# Patient Record
Sex: Male | Born: 1950
Health system: Southern US, Community
[De-identification: ages and names within clinical notes are randomized; demographics above are authoritative.]

## PROBLEM LIST (undated history)

## (undated) DIAGNOSIS — K219 Gastro-esophageal reflux disease without esophagitis: Secondary | ICD-10-CM

## (undated) DIAGNOSIS — L309 Dermatitis, unspecified: Secondary | ICD-10-CM

## (undated) DIAGNOSIS — J302 Other seasonal allergic rhinitis: Secondary | ICD-10-CM

## (undated) DIAGNOSIS — M1990 Unspecified osteoarthritis, unspecified site: Secondary | ICD-10-CM

## (undated) DIAGNOSIS — J45909 Unspecified asthma, uncomplicated: Secondary | ICD-10-CM

## (undated) DIAGNOSIS — L570 Actinic keratosis: Secondary | ICD-10-CM

## (undated) DIAGNOSIS — T7840XA Allergy, unspecified, initial encounter: Secondary | ICD-10-CM

## (undated) DIAGNOSIS — E039 Hypothyroidism, unspecified: Secondary | ICD-10-CM

## (undated) DIAGNOSIS — E079 Disorder of thyroid, unspecified: Secondary | ICD-10-CM

## (undated) HISTORY — DX: Gastro-esophageal reflux disease without esophagitis: K21.9

## (undated) HISTORY — DX: Actinic keratosis: L57.0

## (undated) HISTORY — PX: KNEE SURGERY: SHX244

## (undated) HISTORY — DX: Unspecified osteoarthritis, unspecified site: M19.90

## (undated) HISTORY — DX: Allergy, unspecified, initial encounter: T78.40XA

## (undated) HISTORY — PX: NASAL SEPTUM SURGERY: SHX37

## (undated) HISTORY — PX: CATARACT EXTRACTION: SUR2

## (undated) HISTORY — PX: EYE SURGERY: SHX253

## (undated) HISTORY — DX: Dermatitis, unspecified: L30.9

## (undated) HISTORY — PX: COLON SURGERY: SHX602

---

## 2005-08-04 ENCOUNTER — Ambulatory Visit: Payer: Self-pay | Admitting: Unknown Physician Specialty

## 2006-06-06 DIAGNOSIS — Z86018 Personal history of other benign neoplasm: Secondary | ICD-10-CM

## 2006-06-06 HISTORY — DX: Personal history of other benign neoplasm: Z86.018

## 2008-01-29 ENCOUNTER — Emergency Department: Payer: Self-pay | Admitting: Emergency Medicine

## 2009-03-05 ENCOUNTER — Ambulatory Visit: Payer: Self-pay | Admitting: Family Medicine

## 2009-04-09 ENCOUNTER — Ambulatory Visit: Payer: Self-pay | Admitting: Unknown Physician Specialty

## 2009-10-31 ENCOUNTER — Emergency Department: Payer: Self-pay | Admitting: Internal Medicine

## 2010-08-22 ENCOUNTER — Ambulatory Visit: Payer: Self-pay | Admitting: Family Medicine

## 2010-09-05 ENCOUNTER — Ambulatory Visit: Payer: Self-pay | Admitting: Family Medicine

## 2011-11-06 ENCOUNTER — Ambulatory Visit: Payer: Self-pay | Admitting: Unknown Physician Specialty

## 2011-11-06 LAB — HM COLONOSCOPY

## 2011-11-07 ENCOUNTER — Ambulatory Visit: Payer: Self-pay | Admitting: Family Medicine

## 2011-11-07 LAB — PATHOLOGY REPORT

## 2011-11-29 IMAGING — CT CT ABDOMEN W/ CM
1 of 2 series · 15 of 32 positions shown, 19 images · non-contrast
Comparison: none

REASON FOR EXAM: RUQ pain
COMMENTS:

[Series 2: abd with 5.0 i40f 3 · axial · 0.98mm/px · z∈[-194,+52]mm · 15 of 55 slices shown, 19 images]
[im 3/55  soft-tissue]
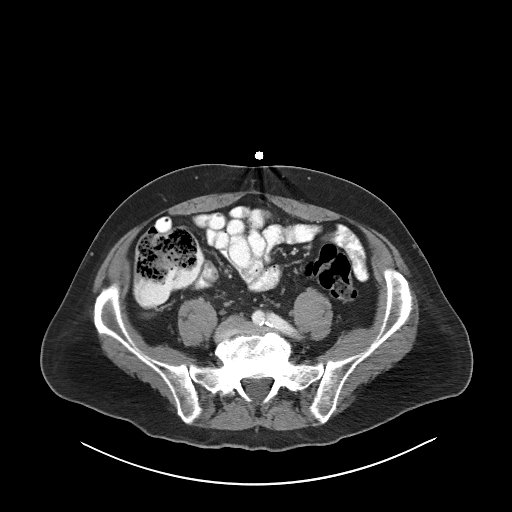
[im 3/55  bone]
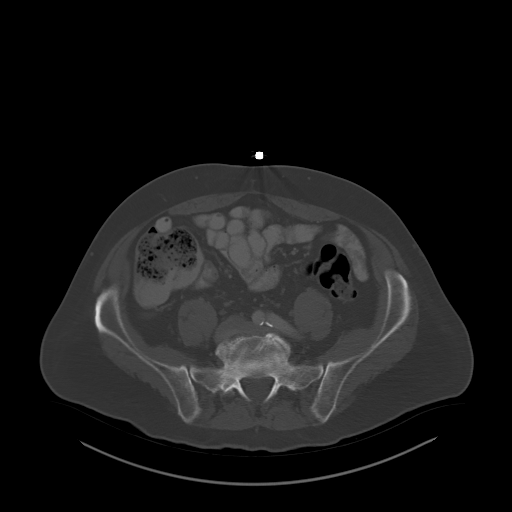
[im 8/55  soft-tissue]
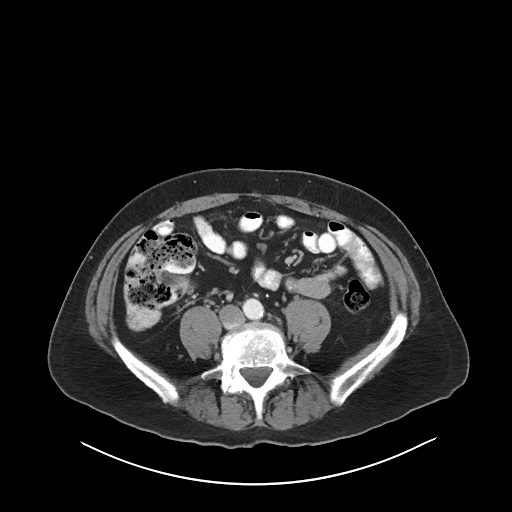
[im 13/55  soft-tissue]
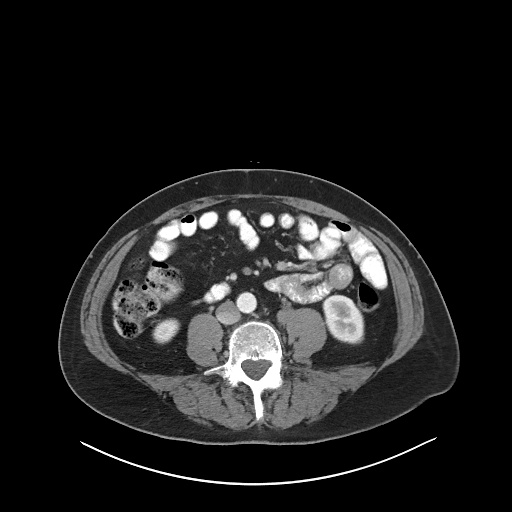
[im 15/55  soft-tissue]
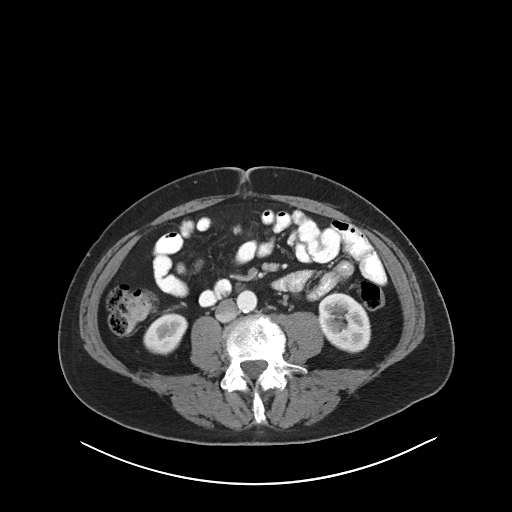
[im 20/55  soft-tissue]
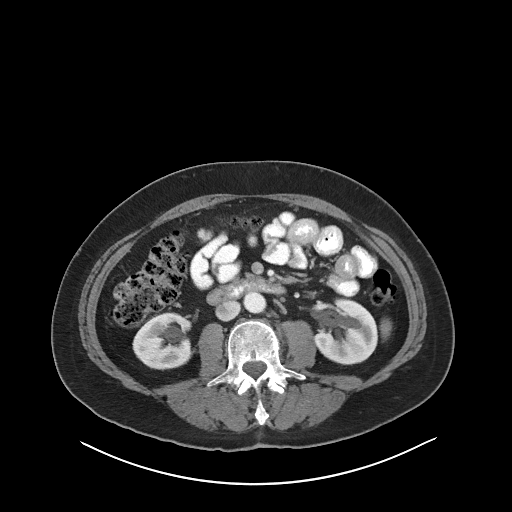
[im 23/55  soft-tissue]
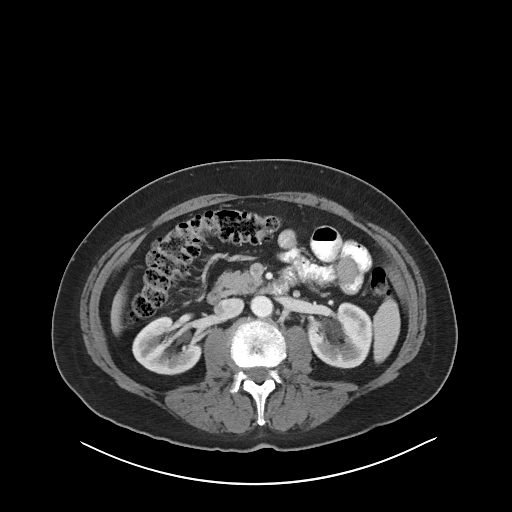
[im 28/55  soft-tissue]
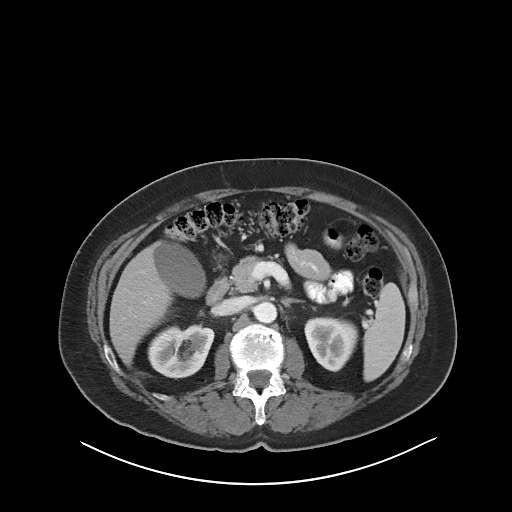
[im 32/55  soft-tissue]
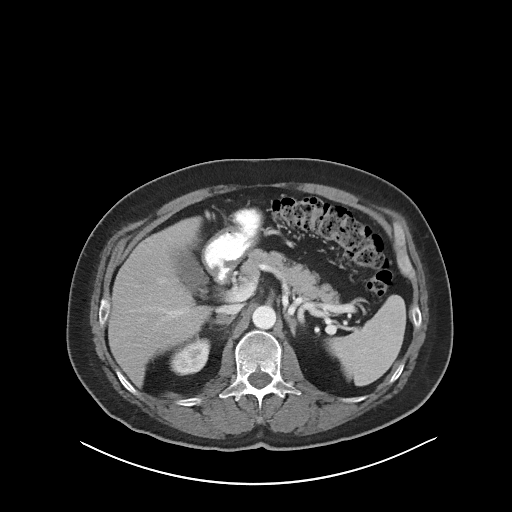
[im 35/55  soft-tissue]
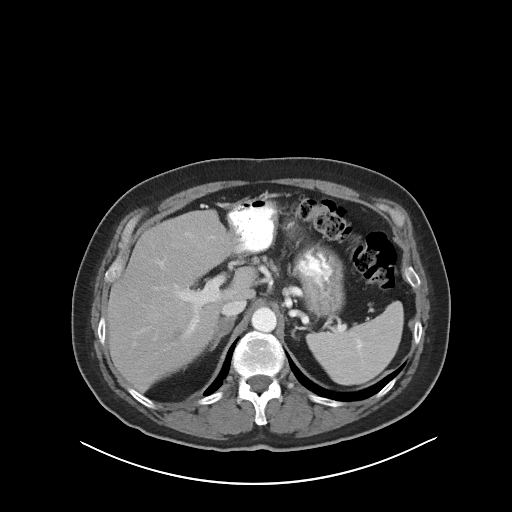
[im 35/55  bone]
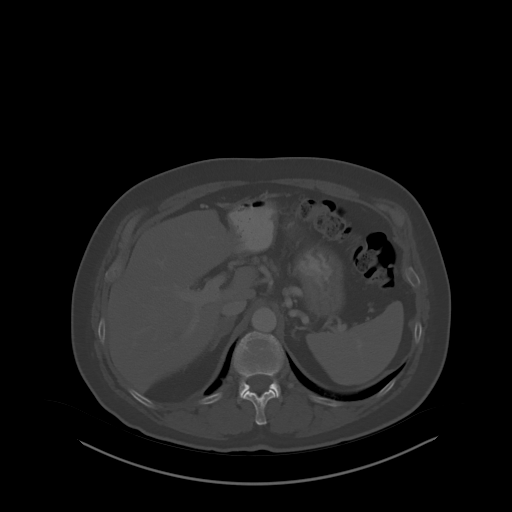
[im 40/55  soft-tissue]
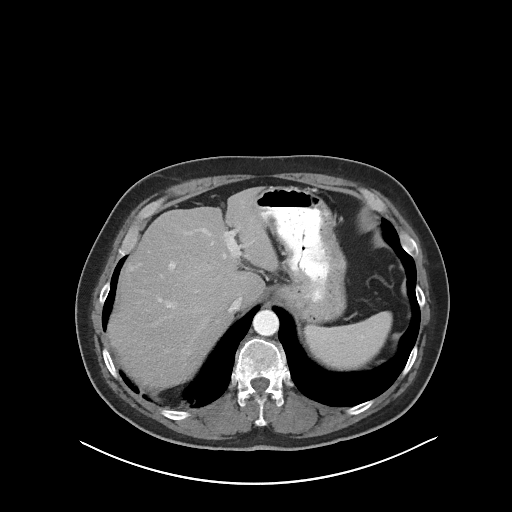
[im 42/55  soft-tissue]
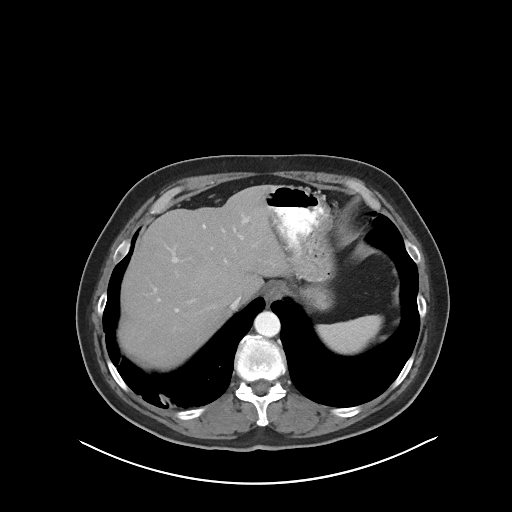
[im 45/55  lung]
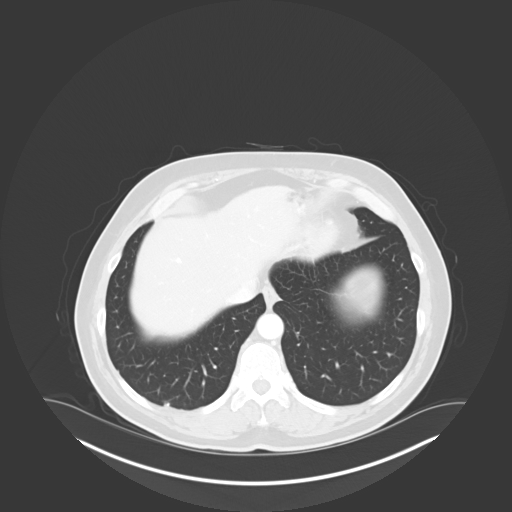
[im 47/55  soft-tissue]
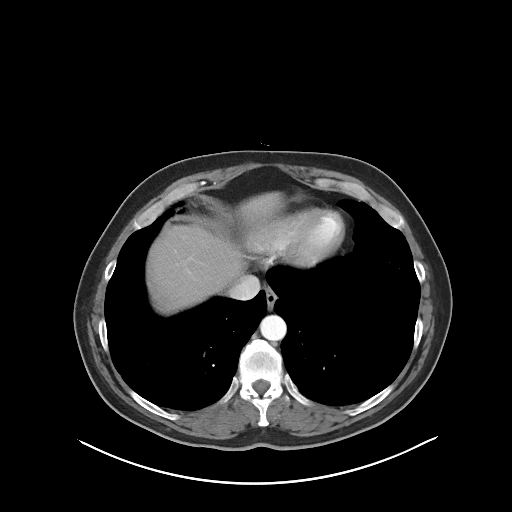
[im 47/55  lung]
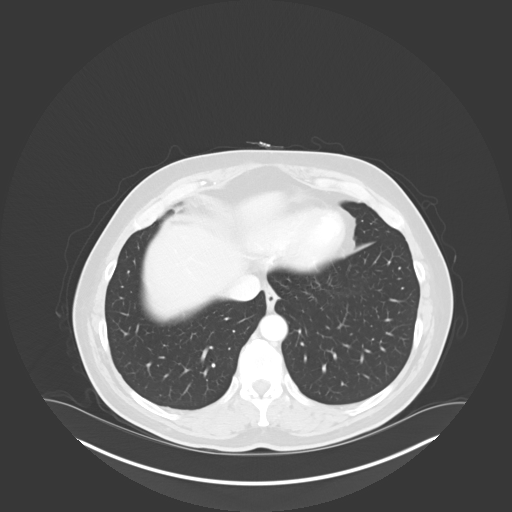
[im 50/55  lung]
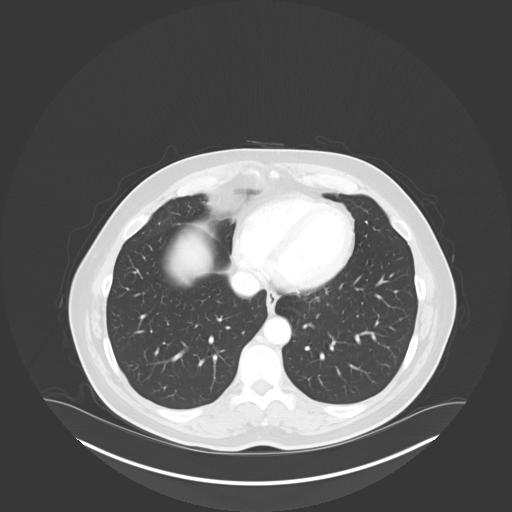
[im 52/55  soft-tissue]
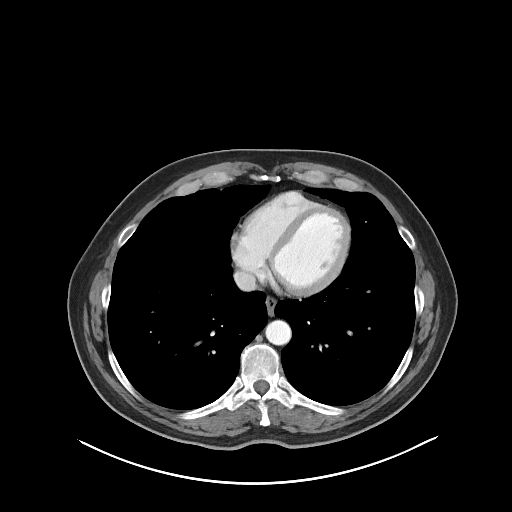
[im 52/55  lung]
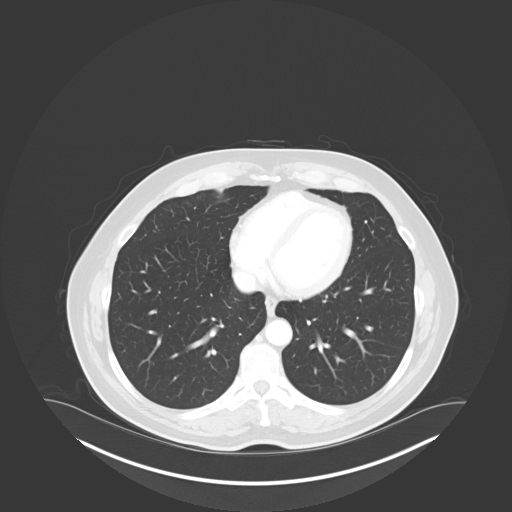

[15 of 32 positions shown; findings below may reference images not displayed]

PROCEDURE:     KCT - KCT ABDOMEN STANDARD W  - September 05, 2010  [DATE]

RESULT:     Axial CT scanning was performed through the abdomen at 5 mm
intervals and slice thicknesses following intravenous administration of 100
cc of Psovue-XKW. The patient also received oral contrast material. Review
of multiplanar reconstructed images was performed separately on the VIA
monitor.

The liver exhibits normal density with no focal mass nor ductal dilation.
The gallbladder is adequately distended with no evidence of calcified stones
or wall thickening or pericholecystic fluid. The pancreas, spleen, partially
distended stomach, left adrenal gland, and kidneys exhibit no acute
abnormality. There are parapelvic cysts within both kidneys. There is mild
enlargement of the right adrenal gland which measures 1.7 cm transversely by
approximately 2.3 cm AP. The caliber of the abdominal aorta is normal. The
partially contrast-filled loops of small and large bowel are normal where
visualized.

The lung bases exhibit atelectasis versus scarring posteriorly in the
costophrenic gutter on the right. A classic alveolar pneumonia is not
demonstrated. I see no pleural effusion. The lower thoracic and upper and
mid lumbar vertebral bodies are preserved in height.
IMPRESSION: 1. I do not see evidence of acute hepatobiliary abnormality.
2. There is atelectasis versus scarring in the posterior costophrenic gutter
on the right.
3. I see no acute abnormality of the stomach or pancreas or spleen.
4. There is mild enlargement of the right adrenal gland.
5. The left adrenal gland, kidneys, and the observed portions of the bowel
exhibit no acute abnormality.

## 2012-07-16 ENCOUNTER — Ambulatory Visit: Payer: Self-pay | Admitting: Ophthalmology

## 2012-07-29 ENCOUNTER — Ambulatory Visit: Payer: Self-pay | Admitting: Ophthalmology

## 2013-01-20 ENCOUNTER — Ambulatory Visit: Payer: Self-pay | Admitting: Family Medicine

## 2013-05-19 ENCOUNTER — Ambulatory Visit: Payer: Self-pay | Admitting: Family Medicine

## 2013-09-01 ENCOUNTER — Ambulatory Visit: Payer: Self-pay | Admitting: Family Medicine

## 2013-09-11 LAB — LIPID PANEL
Cholesterol: 203 mg/dL — AB (ref 0–200)
HDL: 62 mg/dL (ref 35–70)
LDL Cholesterol: 127 mg/dL
LDl/HDL Ratio: 2
Triglycerides: 71 mg/dL (ref 40–160)

## 2013-09-11 LAB — TSH: TSH: 2.32 u[IU]/mL (ref 0.41–5.90)

## 2014-03-10 ENCOUNTER — Emergency Department: Payer: Self-pay | Admitting: Emergency Medicine

## 2014-03-19 ENCOUNTER — Ambulatory Visit: Payer: Self-pay | Admitting: Family Medicine

## 2014-07-28 LAB — BASIC METABOLIC PANEL
Creatinine: 0.9 mg/dL (ref 0.6–1.3)
GLUCOSE: 108 mg/dL
Potassium: 4.3 mmol/L (ref 3.4–5.3)
SODIUM: 140 mmol/L (ref 137–147)

## 2014-07-28 LAB — CBC AND DIFFERENTIAL
HEMATOCRIT: 49 % (ref 41–53)
HEMOGLOBIN: 17.1 g/dL (ref 13.5–17.5)
Neutrophils Absolute: 4 /uL
PLATELETS: 219 10*3/uL (ref 150–399)
WBC: 6.6 10^3/mL

## 2014-09-18 NOTE — Op Note (Signed)
PATIENT NAME:  Daniel [name], BUDZYNSKI MR#:  [id number] DATE OF BIRTH:  May 16, 1951  DATE OF PROCEDURE:  07/29/2012  PREOPERATIVE DIAGNOSIS:  Cataract, left eye.    POSTOPERATIVE DIAGNOSIS:  Cataract, left eye.  PROCEDURE PERFORMED:  Extracapsular cataract extraction using phacoemulsification with placement of an Alcon [id number], 19.0-diopter posterior chamber lens, serial #[id number].  SURGEON:  Loura Back. Dingeldein, MD  ASSISTANT:  None.  ANESTHESIA:  4% lidocaine and 0.75% Marcaine in a 50/50 mixture with 10 units/mL of Hylenex added, given as a peribulbar.   ANESTHESIOLOGIST:  Julianne Handler, MD  COMPLICATIONS:  None.  ESTIMATED BLOOD LOSS:  Less than 1 ml.  DESCRIPTION OF PROCEDURE:  The patient was brought to the operating room and given a peribulbar block.  The patient was then prepped and draped in the usual fashion.  The vertical rectus muscles were imbricated using 5-0 silk sutures.  These sutures were then clamped to the sterile drapes as bridle sutures.  A limbal peritomy was performed extending two clock hours and hemostasis was obtained with cautery.  A partial thickness scleral groove was made at the surgical limbus and dissected anteriorly in a lamellar dissection using an Alcon crescent knife.  The anterior chamber was entered supero-temporally with a Superblade and through the lamellar dissection with a 2.6 mm keratome.  DisCoVisc was used to replace the aqueous and a continuous tear capsulorrhexis was carried out.  Hydrodissection and hydrodelineation were carried out with balanced salt and a 27 gauge canula.  The nucleus was rotated to confirm the effectiveness of the hydrodissection.  Phacoemulsification was carried out using a divide-and-conquer technique.  Total ultrasound time was 1 minute and 25.6 seconds with an average power of 26.60 percent and CDE of 44.06.  Irrigation/aspiration was used to remove the residual cortex.  DisCoVisc was used to inflate the capsule  and the internal incision was enlarged to 3 mm with the crescent knife.  The intraocular lens was folded and inserted into the capsular bag using the AcrySert delivery system.  Irrigation/aspiration was used to remove the residual DisCoVisc.  Miostat was injected into the anterior chamber through the paracentesis track to inflate the anterior chamber and induce miosis. A tenth of a milliliter of cefuroxime was placed into the anterior chamber above the lens. The wound was checked for leaks and none were found. The conjunctiva was closed with cautery and the bridle sutures were removed.  Two drops of 0.3% Vigamox were placed on the eye.   An eye shield was placed on the eye.  The patient was discharged to the recovery room in good condition. ____________________________ Loura Back Dingeldein, MD sad:sb D: 07/29/2012 13:37:14 ET T: 07/29/2012 13:45:47 ET JOB#: [id number]  cc: Remo Lipps A. Dingeldein, MD, <Dictator> Martie Lee MD ELECTRONICALLY SIGNED 08/05/2012 8:23

## 2014-10-14 DIAGNOSIS — Z86010 Personal history of colonic polyps: Secondary | ICD-10-CM | POA: Insufficient documentation

## 2014-11-17 ENCOUNTER — Encounter: Payer: Self-pay | Admitting: *Deleted

## 2014-11-18 ENCOUNTER — Ambulatory Visit: Payer: Medicare Other | Admitting: Anesthesiology

## 2014-11-18 ENCOUNTER — Encounter: Payer: Self-pay | Admitting: *Deleted

## 2014-11-18 ENCOUNTER — Ambulatory Visit
Admission: RE | Admit: 2014-11-18 | Discharge: 2014-11-18 | Disposition: A | Discharge status: Home / Self Care | Payer: Medicare Other | Source: Ambulatory Visit | Attending: Unknown Physician Specialty | Admitting: Unknown Physician Specialty

## 2014-11-18 ENCOUNTER — Encounter
Admission: RE | Disposition: A | Discharge status: Home / Self Care | Payer: Self-pay | Source: Ambulatory Visit | Attending: Unknown Physician Specialty

## 2014-11-18 DIAGNOSIS — Z86010 Personal history of colonic polyps: Secondary | ICD-10-CM | POA: Insufficient documentation

## 2014-11-18 DIAGNOSIS — K640 First degree hemorrhoids: Secondary | ICD-10-CM | POA: Diagnosis not present

## 2014-11-18 DIAGNOSIS — J45909 Unspecified asthma, uncomplicated: Secondary | ICD-10-CM | POA: Insufficient documentation

## 2014-11-18 DIAGNOSIS — E039 Hypothyroidism, unspecified: Secondary | ICD-10-CM | POA: Insufficient documentation

## 2014-11-18 DIAGNOSIS — D122 Benign neoplasm of ascending colon: Secondary | ICD-10-CM | POA: Diagnosis not present

## 2014-11-18 DIAGNOSIS — K621 Rectal polyp: Secondary | ICD-10-CM | POA: Diagnosis not present

## 2014-11-18 DIAGNOSIS — Z79899 Other long term (current) drug therapy: Secondary | ICD-10-CM | POA: Insufficient documentation

## 2014-11-18 DIAGNOSIS — K5730 Diverticulosis of large intestine without perforation or abscess without bleeding: Secondary | ICD-10-CM | POA: Diagnosis not present

## 2014-11-18 HISTORY — DX: Disorder of thyroid, unspecified: E07.9

## 2014-11-18 HISTORY — DX: Other seasonal allergic rhinitis: J30.2

## 2014-11-18 HISTORY — PX: COLONOSCOPY WITH PROPOFOL: SHX5780

## 2014-11-18 HISTORY — DX: Unspecified asthma, uncomplicated: J45.909

## 2014-11-18 HISTORY — DX: Hypothyroidism, unspecified: E03.9

## 2014-11-18 SURGERY — COLONOSCOPY WITH PROPOFOL
Anesthesia: General

## 2014-11-18 MED ORDER — PROPOFOL INFUSION 10 MG/ML OPTIME
INTRAVENOUS | Status: DC | PRN
  Administered 2014-11-18: 140 ug/kg/min via INTRAVENOUS

## 2014-11-18 MED ORDER — SODIUM CHLORIDE 0.9 % IV SOLN: INTRAVENOUS | Status: DC

## 2014-11-18 MED ORDER — MIDAZOLAM HCL 2 MG/2ML IJ SOLN
INTRAMUSCULAR | Status: DC | PRN
  Administered 2014-11-18: 1 mg via INTRAVENOUS

## 2014-11-18 MED ORDER — FENTANYL CITRATE (PF) 100 MCG/2ML IJ SOLN
INTRAMUSCULAR | Status: DC | PRN
  Administered 2014-11-18: 50 ug via INTRAVENOUS

## 2014-11-18 MED ORDER — LACTATED RINGERS IV SOLN
INTRAVENOUS | Status: DC
  Administered 2014-11-18: 1000 mL via INTRAVENOUS

## 2014-11-18 MED ORDER — EPHEDRINE SULFATE 50 MG/ML IJ SOLN
INTRAMUSCULAR | Status: DC | PRN
  Administered 2014-11-18: 10 mg via INTRAVENOUS

## 2014-11-18 NOTE — Op Note (Signed)
[facility] Patient Name: Daniel [name] Procedure Date: 11/18/2014 10:36 AM MRN: [id number] Account #: [id number] Date of Birth: June 15, 1950 Admit Type: Outpatient Age: 64 Room: [facility] ENDO ROOM 4 Gender: Male Note Status: Finalized Procedure:         Colonoscopy Indications:       Personal history of colonic polyps Providers:         [name], MD Referring MD:      Janine Ores. [name], MD (Referring MD) Medicines:         Propofol per Anesthesia Complications:     No immediate complications. Procedure:         Pre-Anesthesia Assessment:                    - After reviewing the risks and benefits, the patient was                     deemed in satisfactory condition to undergo the procedure.                    After obtaining informed consent, the colonoscope was                     passed under direct vision. Throughout the procedure, the                     patient's blood pressure, pulse, and oxygen saturations                     were monitored continuously. The Colonoscope was                     introduced through the anus and advanced to the the cecum,                     identified by appendiceal orifice and ileocecal valve. The                     colonoscopy was performed without difficulty. The patient                     tolerated the procedure well. The quality of the bowel                     preparation was excellent. Findings:      Three sessile polyps were found in the rectum and in the ascending       colon. The polyps were diminutive in size. These polyps were removed       with a jumbo cold forceps. Resection and retrieval were complete.      Internal hemorrhoids were found during endoscopy. The hemorrhoids were       small and Grade I (internal hemorrhoids that do not prolapse).      A few medium-mouthed diverticula were found in the sigmoid colon.      The exam was otherwise without abnormality. Impression:        -  Three diminutive polyps in the rectum and in the                     ascending colon. Resected and retrieved.                    - Internal hemorrhoids.                    -  Diverticulosis in the sigmoid colon.                    - The examination was otherwise normal. Recommendation:    - Await pathology results. [name], MD 11/18/2014 11:05:38 AM This report has been signed electronically. Number of Addenda: 0 Note Initiated On: 11/18/2014 10:36 AM Scope Withdrawal Time: 0 hours 10 minutes 22 seconds  Total Procedure Duration: 0 hours 14 minutes 23 seconds       [facility]

## 2014-11-18 NOTE — Transfer of Care (Signed)
Immediate Anesthesia Transfer of Care Note  Patient: Daniel [name]  Procedure(s) Performed: Procedure(s): COLONOSCOPY WITH PROPOFOL (N/A)  Patient Location: PACU  Anesthesia Type:General  Level of Consciousness: awake and sedated  Airway & Oxygen Therapy: Patient Spontanous Breathing and Patient connected to nasal cannula oxygen  Post-op Assessment: Report given to RN and Post -op Vital signs reviewed and stable  Post vital signs: Reviewed and stable  Last Vitals:  Filed Vitals:   11/18/14 1026  BP: 131/80  Pulse: 81  Temp: 36.4 C  Resp: 17    Complications: No apparent anesthesia complications

## 2014-11-18 NOTE — Anesthesia Preprocedure Evaluation (Signed)
Anesthesia Evaluation  Patient identified by MRN, date of birth, ID band Patient awake    Reviewed: Allergy & Precautions, NPO status , Patient's Chart, lab work & pertinent test results  History of Anesthesia Complications Negative for: history of anesthetic complications  Airway Mallampati: III       Dental  (+) Teeth Intact   Pulmonary asthma ,    Pulmonary exam normal       Cardiovascular negative cardio ROS Normal cardiovascular exam    Neuro/Psych negative neurological ROS  negative psych ROS   GI/Hepatic negative GI ROS, Neg liver ROS,   Endo/Other  Hypothyroidism   Renal/GU   negative genitourinary   Musculoskeletal negative musculoskeletal ROS (+)   Abdominal Normal abdominal exam  (+)   Peds negative pediatric ROS (+)  Hematology negative hematology ROS (+)   Anesthesia Other Findings   Reproductive/Obstetrics negative OB ROS                             Anesthesia Physical Anesthesia Plan  ASA: II  Anesthesia Plan: General   Post-op Pain Management:    Induction: Intravenous  Airway Management Planned: Nasal Cannula  Additional Equipment:   Intra-op Plan:   Post-operative Plan:   Informed Consent: I have reviewed the patients History and Physical, chart, labs and discussed the procedure including the risks, benefits and alternatives for the proposed anesthesia with the patient or authorized representative who has indicated his/her understanding and acceptance.     Plan Discussed with: CRNA  Anesthesia Plan Comments:         Anesthesia Quick Evaluation

## 2014-11-18 NOTE — Anesthesia Procedure Notes (Signed)
Performed by: Vaughan Sine Pre-anesthesia Checklist: Patient identified, Emergency Drugs available, Suction available, Patient being monitored and Timeout performed Patient Re-evaluated:Patient Re-evaluated prior to inductionOxygen Delivery Method: Nasal cannula Preoxygenation: Pre-oxygenation with 100% oxygen Intubation Type: IV induction Placement Confirmation: CO2 detector

## 2014-11-18 NOTE — H&P (Signed)
Primary Care Physician:  Wilhemena Durie, MD Primary Gastroenterologist:  Dr. Vira Agar  Pre-Procedure History & Physical: HPI:  Daniel [name] is a 64 y.o. male is here for an colonoscopy.   Past Medical History  Diagnosis Date  . Seasonal allergies   . Asthma   . Thyroid disease   . Hypothyroidism     Past Surgical History  Procedure Laterality Date  . Eye surgery    . Nasal septum surgery    . Knee surgery    . Colon surgery      Prior to Admission medications   Medication Sig Start Date End Date Taking? Authorizing Provider  finasteride (PROSCAR) 5 MG tablet Take 5 mg by mouth daily.   Yes Historical Provider, MD  levothyroxine (SYNTHROID, LEVOTHROID) 100 MCG tablet Take 100 mcg by mouth daily before breakfast.   Yes Historical Provider, MD  omeprazole (PRILOSEC OTC) 20 MG tablet Take 20 mg by mouth daily.   Yes Historical Provider, MD  polyethylene glycol (MIRALAX / GLYCOLAX) packet Take 17 g by mouth daily.   Yes Historical Provider, MD    Allergies as of 10/15/2014  . (Not on File)    Family History  Problem Relation Age of Onset  . Family history unknown: Yes    History   Social History  . Marital Status: Married    Spouse Name: N/A  . Number of Children: N/A  . Years of Education: N/A   Occupational History  . Not on file.   Social History Main Topics  . Smoking status: Never Smoker   . Smokeless tobacco: Not on file  . Alcohol Use: Yes  . Drug Use: Not on file  . Sexual Activity: Not on file   Other Topics Concern  . Not on file   Social History Narrative    Review of Systems: See HPI, otherwise negative ROS  Physical Exam: BP 131/80 mmHg  Pulse 81  Temp(Src) 97.6 F (36.4 C) (Tympanic)  Resp 17  Ht 6' 2.5" (1.892 m)  Wt 99.791 kg (220 lb)  BMI 27.88 kg/m2  SpO2 99% General:   Alert,  pleasant and cooperative in NAD Head:  Normocephalic and atraumatic. Neck:  Supple; no masses or thyromegaly. Lungs:  Clear throughout to  auscultation.    Heart:  Regular rate and rhythm. Abdomen:  Soft, nontender and nondistended. Normal bowel sounds, without guarding, and without rebound.   Neurologic:  Alert and  oriented x4;  grossly normal neurologically.  Impression/Plan: [name] is here for an colonoscopy to be performed for personal history of colon polyps  Risks, benefits, limitations, and alternatives regarding  colonoscopy have been reviewed with the patient.  Questions have been answered.  All parties agreeable.   Gaylyn Cheers, MD  11/18/2014, 11:09 AM   Primary Care Physician:  Wilhemena Durie, MD Primary Gastroenterologist:  Dr. Vira Agar  Pre-Procedure History & Physical: HPI:  Daniel [name] is a 64 y.o. male is here for an colonoscopy.   Past Medical History  Diagnosis Date  . Seasonal allergies   . Asthma   . Thyroid disease   . Hypothyroidism     Past Surgical History  Procedure Laterality Date  . Eye surgery    . Nasal septum surgery    . Knee surgery    . Colon surgery      Prior to Admission medications   Medication Sig Start Date End Date Taking? Authorizing Provider  finasteride (PROSCAR) 5 MG tablet Take  5 mg by mouth daily.   Yes Historical Provider, MD  levothyroxine (SYNTHROID, LEVOTHROID) 100 MCG tablet Take 100 mcg by mouth daily before breakfast.   Yes Historical Provider, MD  omeprazole (PRILOSEC OTC) 20 MG tablet Take 20 mg by mouth daily.   Yes Historical Provider, MD  polyethylene glycol (MIRALAX / GLYCOLAX) packet Take 17 g by mouth daily.   Yes Historical Provider, MD    Allergies as of 10/15/2014  . (Not on File)    Family History  Problem Relation Age of Onset  . Family history unknown: Yes    History   Social History  . Marital Status: Married    Spouse Name: N/A  . Number of Children: N/A  . Years of Education: N/A   Occupational History  . Not on file.   Social History Main Topics  . Smoking status: Never Smoker   . Smokeless  tobacco: Not on file  . Alcohol Use: Yes  . Drug Use: Not on file  . Sexual Activity: Not on file   Other Topics Concern  . Not on file   Social History Narrative    Review of Systems: See HPI, otherwise negative ROS  Physical Exam: BP 131/80 mmHg  Pulse 81  Temp(Src) 97.6 F (36.4 C) (Tympanic)  Resp 17  Ht 6' 2.5" (1.892 m)  Wt 99.791 kg (220 lb)  BMI 27.88 kg/m2  SpO2 99% General:   Alert,  pleasant and cooperative in NAD Head:  Normocephalic and atraumatic. Neck:  Supple; no masses or thyromegaly. Lungs:  Clear throughout to auscultation.    Heart:  Regular rate and rhythm. Abdomen:  Soft, nontender and nondistended. Normal bowel sounds, without guarding, and without rebound.   Neurologic:  Alert and  oriented x4;  grossly normal neurologically.  Impression/Plan: [name] is here for an colonoscopy to be performed for personal history of colon polyps  Risks, benefits, limitations, and alternatives regarding  colonoscopy have been reviewed with the patient.  Questions have been answered.  All parties agreeable.   Gaylyn Cheers, MD  11/18/2014, 11:09 AM

## 2014-11-18 NOTE — Anesthesia Postprocedure Evaluation (Signed)
  Anesthesia Post-op Note  Patient: Daniel [name]  Procedure(s) Performed: Procedure(s): COLONOSCOPY WITH PROPOFOL (N/A)  Anesthesia type:General  Patient location: PACU  Post pain: Pain level controlled  Post assessment: Post-op Vital signs reviewed, Patient's Cardiovascular Status Stable, Respiratory Function Stable, Patent Airway and No signs of Nausea or vomiting  Post vital signs: Reviewed and stable  Last Vitals:  Filed Vitals:   11/18/14 1115  BP: 107/89  Pulse: 83  Temp:   Resp: 22    Level of consciousness: awake, alert  and patient cooperative  Complications: No apparent anesthesia complications

## 2014-11-18 NOTE — Anesthesia Postprocedure Evaluation (Signed)
  Anesthesia Post-op Note  Patient: Daniel [name]  Procedure(s) Performed: Procedure(s): COLONOSCOPY WITH PROPOFOL (N/A)  Anesthesia type:General  Patient location: PACU  Post pain: Pain level controlled  Post assessment: Post-op Vital signs reviewed, Patient's Cardiovascular Status Stable, Respiratory Function Stable, Patent Airway and No signs of Nausea or vomiting  Post vital signs: Reviewed and stable  Last Vitals:  Filed Vitals:   11/18/14 1026  BP: 131/80  Pulse: 81  Temp: 36.4 C  Resp: 17    Level of consciousness: awake, alert  and patient cooperative  Complications: No apparent anesthesia complications

## 2014-11-19 LAB — SURGICAL PATHOLOGY

## 2014-11-21 DIAGNOSIS — K4090 Unilateral inguinal hernia, without obstruction or gangrene, not specified as recurrent: Secondary | ICD-10-CM | POA: Insufficient documentation

## 2014-11-21 DIAGNOSIS — E669 Obesity, unspecified: Secondary | ICD-10-CM | POA: Insufficient documentation

## 2014-11-21 DIAGNOSIS — N400 Enlarged prostate without lower urinary tract symptoms: Secondary | ICD-10-CM

## 2014-11-21 DIAGNOSIS — E038 Other specified hypothyroidism: Secondary | ICD-10-CM

## 2014-11-21 DIAGNOSIS — E039 Hypothyroidism, unspecified: Secondary | ICD-10-CM | POA: Insufficient documentation

## 2014-11-21 DIAGNOSIS — J45909 Unspecified asthma, uncomplicated: Secondary | ICD-10-CM | POA: Insufficient documentation

## 2014-11-21 DIAGNOSIS — M1990 Unspecified osteoarthritis, unspecified site: Secondary | ICD-10-CM | POA: Insufficient documentation

## 2014-12-14 ENCOUNTER — Encounter: Payer: Self-pay | Admitting: Family Medicine

## 2015-01-14 ENCOUNTER — Ambulatory Visit (INDEPENDENT_AMBULATORY_CARE_PROVIDER_SITE_OTHER): Payer: Medicare Other | Admitting: Family Medicine

## 2015-01-14 ENCOUNTER — Encounter: Payer: Self-pay | Admitting: Family Medicine

## 2015-01-14 VITALS — BP 116/78 | HR 81 | Temp 98.6°F | Resp 16 | Wt 228.6 lb

## 2015-01-14 DIAGNOSIS — J069 Acute upper respiratory infection, unspecified: Secondary | ICD-10-CM | POA: Diagnosis not present

## 2015-01-14 NOTE — Progress Notes (Signed)
Subjective:     Patient ID: Daniel [name], male   DOB: 1950-07-06, 64 y.o.   MRN: [id number]  HPI  Chief Complaint  Patient presents with  . Cough    Patient comes in office today with concerns of cough and congestion since Monday (8/15). Patient states that when symptoms initially began he just had a sore throat, now sore throat has subsided he reports having productive cough with green/yellow phlegm. Associated symptoms include nasal congestion and decreased hearing in both ears, patient states that he is able to pop his ears. Patient has taken otc Aleve for relief.   His wife has been sick as well.   Review of Systems  Constitutional: Negative for fever and chills.       Objective:   Physical Exam Ears: T.M's intact without inflammation Throat: no tonsillar enlargement or exudate Neck: no cervical adenopathy Lungs: clear    Assessment:    1. Upper respiratory infection    Plan:    Discussed use of Mucinex D for congestion, Delsym for cough, and Benadryl for postnasal drainage. Patient defers rx cough syrup.

## 2015-01-14 NOTE — Patient Instructions (Signed)
Discussed use of Mucinex D for congestion, Delsym for cough, and Benadryl for postnasal drainage 

## 2015-01-27 ENCOUNTER — Ambulatory Visit (INDEPENDENT_AMBULATORY_CARE_PROVIDER_SITE_OTHER): Payer: Medicare Other | Admitting: Family Medicine

## 2015-01-27 ENCOUNTER — Encounter: Payer: Self-pay | Admitting: Family Medicine

## 2015-01-27 VITALS — BP 118/68 | HR 72 | Temp 98.1°F | Resp 16 | Ht 73.0 in | Wt 226.0 lb

## 2015-01-27 DIAGNOSIS — Z0000 Encounter for general adult medical examination without abnormal findings: Secondary | ICD-10-CM | POA: Diagnosis not present

## 2015-01-27 DIAGNOSIS — Z23 Encounter for immunization: Secondary | ICD-10-CM

## 2015-01-27 NOTE — Progress Notes (Signed)
Patient ID: Daniel [name], male   DOB: 01-25-1951, 64 y.o.   MRN: [id number] Patient: Daniel [name], Male    DOB: Apr 29, 1951, 64 y.o.   MRN: [id number] Visit Date: 01/27/2015  Today's Provider: Wilhemena Durie, MD   Chief Complaint  Patient presents with  . Annual Exam   Subjective:   Daniel [name] is a 64 y.o. male who presents today for his Subsequent Annual Wellness Visit. He feels fairly well. Has a cold with cough and congestion.  He reports exercising, walking about a mile a day. He reports he is sleeping well.  this is a welcome to Medicare visit. Colonoscopy- 11/06/11- repeat 10/2016 EKG-08/09/11 BMD 01/23/07   Review of Systems  Constitutional: Negative.   HENT: Positive for congestion.   Eyes: Negative.   Respiratory: Positive for cough and wheezing.   Endocrine: Negative.   Genitourinary: Negative.   Musculoskeletal: Positive for arthralgias.  Skin: Negative.   Allergic/Immunologic: Negative.   Neurological: Negative.   Hematological: Negative.   Psychiatric/Behavioral: Negative.     Patient Active Problem List   Diagnosis Date Noted  . BPH (benign prostatic hyperplasia) 11/21/2014  . Hypothyroidism 11/21/2014  . Obesity 11/21/2014  . Asthma 11/21/2014  . Inguinal hernia 11/21/2014  . OA (osteoarthritis) 11/21/2014  . H/O adenomatous polyp of colon 10/14/2014    Social History   Social History  . Marital Status: Married    Spouse Name: N/A  . Number of Children: N/A  . Years of Education: N/A   Occupational History  . Not on file.   Social History Main Topics  . Smoking status: Former Research scientist (life sciences)  . Smokeless tobacco: Not on file     Comment: quit smoking 25 years ago  . Alcohol Use: Yes     Comment: 1-2 glasses of wine 4-5 times a week  . Drug Use: No  . Sexual Activity: Not on file   Other Topics Concern  . Not on file   Social History Narrative    Past Surgical History  Procedure Laterality Date  . Eye surgery    . Nasal septum surgery     . Knee surgery    . Colon surgery    . Colonoscopy with propofol N/A 11/18/2014    Procedure: COLONOSCOPY WITH PROPOFOL;  Surgeon: [name], MD;  Location: [facility] ENDOSCOPY;  Service: Endoscopy;  Laterality: N/A;  . Cataract extraction      His family history includes Arthritis in his brother; Cervical cancer in his mother; Colon cancer in his mother; Colon polyps in his mother; Heart attack in his brother; Hypertension in his father; Leukemia in his father; Osteoporosis in his mother; Throat cancer in his maternal grandmother.    Outpatient Prescriptions Prior to Visit  Medication Sig Dispense Refill  . finasteride (PROSCAR) 5 MG tablet Take 5 mg by mouth daily.    Marland Kitchen levothyroxine (SYNTHROID, LEVOTHROID) 100 MCG tablet Take 100 mcg by mouth daily before breakfast.    . omeprazole (PRILOSEC OTC) 20 MG tablet Take 20 mg by mouth daily.    . mometasone-formoterol (DULERA) 100-5 MCG/ACT AERO Inhale 2 puffs into the lungs 2 (two) times daily.    . Multiple Vitamins-Minerals (MULTIVITAMIN ADULT PO) Take 1 tablet by mouth daily.    . Omega-3 Fatty Acids (FISH OIL) 1000 MG CAPS Take by mouth.    . polyethylene glycol (MIRALAX / GLYCOLAX) packet Take 17 g by mouth daily.    . Psyllium (METAMUCIL) 48.57 % POWD Take 1 Package  by mouth daily.     No facility-administered medications prior to visit.    Allergies  Allergen Reactions  . Ciprofloxacin Hcl Other (See Comments)    Blisters break out  . Levaquin [Levofloxacin In D5w]   . Sulfa Antibiotics Other (See Comments)    unknown    Patient Care Team: Jerrol Banana., MD as PCP - General (Family Medicine)  Objective:   Vitals:  Filed Vitals:   01/27/15 1034  BP: 118/68  Pulse: 72  Temp: 98.1 F (36.7 C)  TempSrc: Oral  Resp: 16  Height: 6\' 1"  (1.854 m)  Weight: 226 lb (102.513 kg)    Physical Exam  Constitutional: He is oriented to person, place, and time. He appears well-developed and well-nourished.  HENT:   Head: Normocephalic and atraumatic.  Right Ear: External ear normal.  Left Ear: External ear normal.  Nose: Nose normal.  Mouth/Throat: Oropharynx is clear and moist.  Eyes: Conjunctivae and EOM are normal. Pupils are equal, round, and reactive to light.  Neck: Normal range of motion. Neck supple.  Cardiovascular: Normal rate, regular rhythm, normal heart sounds and intact distal pulses.   Abdominal: Soft. Bowel sounds are normal.  Genitourinary:  Small left inguinal hernia that does not extend down into the sac  Musculoskeletal: Normal range of motion.  Neurological: He is alert and oriented to person, place, and time. He has normal reflexes.  Skin: Skin is warm and dry.  Psychiatric: He has a normal mood and affect. His behavior is normal. Judgment and thought content normal.    Activities of Daily Living In your present state of health, do you have any difficulty performing the following activities: 01/27/2015  Hearing? Y  Vision? N  Difficulty concentrating or making decisions? N  Walking or climbing stairs? N  Dressing or bathing? N  Doing errands, shopping? N    Fall Risk Assessment Fall Risk  01/27/2015  Falls in the past year? No     Depression Screen PHQ 2/9 Scores 01/27/2015  PHQ - 2 Score 0    Cognitive Testing - 6-CIT    Year: 0 points  Month: 0 points  Memorize "Pia Mau, [address], [place]"  Time (within 1 hour:) 0 points  Count backwards from 20: 0 points  Name months of year: 0 points  Repeat Address: 2 points   Total Score: 2/28  Interpretation : Normal (0-7) Abnormal (8-28)    Assessment & Plan:     Annual Wellness Visit  Reviewed patient's Family Medical History Reviewed and updated list of patient's medical providers Assessment of cognitive impairment was done Assessed patient's functional ability Established a written schedule for health screening [place] Completed and Reviewed  Exercise Activities and  Dietary recommendations Goals    None      Immunization History  Administered Date(s) Administered  . Tdap 01/23/2007  . Zoster 05/31/2010    Health Maintenance  Topic Date Due  . Hepatitis C Screening  1950-06-16  . HIV Screening  06/05/1964  . PNA vac Low Risk Adult (1 of 2 - PCV13) 06/05/2014  . INFLUENZA VACCINE  12/28/2014  . TETANUS/TDAP  01/22/2017  . COLONOSCOPY  11/17/2024  . ZOSTAVAX  Completed      Discussed health benefits of physical activity, and encouraged him to engage in regular exercise appropriate for his age and condition.    Miguel Aschoff MD [facility] 01/27/2015 10:37 AM  ------------------------------------------------------------------------------------------------------------

## 2015-02-25 ENCOUNTER — Ambulatory Visit (INDEPENDENT_AMBULATORY_CARE_PROVIDER_SITE_OTHER): Payer: Medicare Other

## 2015-02-25 DIAGNOSIS — Z23 Encounter for immunization: Secondary | ICD-10-CM

## 2015-02-28 ENCOUNTER — Other Ambulatory Visit: Payer: Self-pay | Admitting: Family Medicine

## 2015-03-01 ENCOUNTER — Other Ambulatory Visit: Payer: Self-pay | Admitting: Family Medicine

## 2015-03-01 MED ORDER — LEVOTHYROXINE SODIUM 100 MCG PO TABS: 100.0000 ug | ORAL_TABLET | Freq: Every day | ORAL | Status: DC

## 2015-03-01 NOTE — Telephone Encounter (Signed)
Pt states he needs his levothyroxine (SYNTHROID, LEVOTHROID) 100 MCG tablet refilled, [facility] [address]. CB#[phone] CC

## 2015-03-01 NOTE — Telephone Encounter (Signed)
Done-aa 

## 2015-05-27 ENCOUNTER — Ambulatory Visit (INDEPENDENT_AMBULATORY_CARE_PROVIDER_SITE_OTHER): Payer: Medicare Other | Admitting: Family Medicine

## 2015-05-27 VITALS — BP 132/84 | HR 72 | Temp 97.9°F | Resp 16 | Wt 233.0 lb

## 2015-05-27 DIAGNOSIS — M546 Pain in thoracic spine: Secondary | ICD-10-CM | POA: Diagnosis not present

## 2015-05-27 MED ORDER — CYCLOBENZAPRINE HCL 5 MG PO TABS: 5.0000 mg | ORAL_TABLET | Freq: Three times a day (TID) | ORAL | Status: DC | PRN

## 2015-05-27 NOTE — Progress Notes (Signed)
Subjective:     Patient ID: Daniel [name], male   DOB: 1950/10/04, 64 y.o.   MRN: [id number]  HPI  Chief Complaint  Patient presents with  . Back Pain  No specific injury reported. Reports right sided non-radicular back pain with movement. No prior history of back surgery. Has taken Naproxen with mild improvement.   Review of Systems     Objective:   Physical Exam  Constitutional: He appears well-developed and well-nourished. No distress (mild discomfort changing positions).  Musculoskeletal:  Muscle strength in lower extremities 5/5. SLR's to 90 degrees without radiation of back pain. Localizes to right lower thoracic paravertebral area; non-tender to the touch.       Assessment:    1. Right-sided thoracic back pain - cyclobenzaprine (FLEXERIL) 5 MG tablet; Take 1 tablet (5 mg total) by mouth 3 (three) times daily as needed for muscle spasms.  Dispense: 30 tablet; Refill: 0    Plan:    Discussed use of Aleve and Tylenol.

## 2015-05-27 NOTE — Patient Instructions (Signed)
Discussed use of Aleve two pills twice daily with food. May also add Tylenol up up 3000 mg/day.

## 2015-05-28 ENCOUNTER — Other Ambulatory Visit: Payer: Self-pay | Admitting: Family Medicine

## 2015-05-28 ENCOUNTER — Telehealth: Payer: Self-pay | Admitting: Family Medicine

## 2015-05-28 DIAGNOSIS — S39012A Strain of muscle, fascia and tendon of lower back, initial encounter: Secondary | ICD-10-CM

## 2015-05-28 MED ORDER — HYDROCODONE-ACETAMINOPHEN 5-325 MG PO TABS: ORAL_TABLET | ORAL | Status: DC

## 2015-05-28 NOTE — Telephone Encounter (Signed)
Pt states he is still having a lot of back pain and is requesting a Rx to help with the pain.  [place].  [phone]

## 2015-05-28 NOTE — Telephone Encounter (Signed)
Let him know prescription for narcotic pain medication is available for pickup at our office.

## 2015-05-28 NOTE — Telephone Encounter (Signed)
Patient advised RX is at front desk for pick up.  

## 2015-06-01 ENCOUNTER — Ambulatory Visit (INDEPENDENT_AMBULATORY_CARE_PROVIDER_SITE_OTHER): Payer: Medicare Other | Admitting: Family Medicine

## 2015-06-01 ENCOUNTER — Encounter: Payer: Self-pay | Admitting: Family Medicine

## 2015-06-01 ENCOUNTER — Ambulatory Visit
Admission: RE | Admit: 2015-06-01 | Discharge: 2015-06-01 | Disposition: A | Discharge status: Home / Self Care | Payer: Medicare Other | Source: Ambulatory Visit | Attending: Family Medicine | Admitting: Family Medicine

## 2015-06-01 VITALS — BP 118/80 | HR 84 | Temp 98.9°F | Resp 16 | Wt 232.0 lb

## 2015-06-01 DIAGNOSIS — R319 Hematuria, unspecified: Secondary | ICD-10-CM

## 2015-06-01 DIAGNOSIS — M546 Pain in thoracic spine: Secondary | ICD-10-CM | POA: Diagnosis not present

## 2015-06-01 LAB — POCT URINALYSIS DIPSTICK
BILIRUBIN UA: NEGATIVE
GLUCOSE UA: NEGATIVE
Ketones, UA: NEGATIVE
Leukocytes, UA: NEGATIVE
Nitrite, UA: NEGATIVE
Protein, UA: NEGATIVE
Spec Grav, UA: 1.025
Urobilinogen, UA: 1
pH, UA: 5

## 2015-06-01 NOTE — Progress Notes (Signed)
Subjective:     Patient ID: Daniel [name], male   DOB: 04-29-1951, 65 y.o.   MRN: [id number]  HPI  Chief Complaint  Patient presents with  . Back Pain    Patient comes in office today for follow up from 05/27/15. Patient states since visit symptoms of right sided back pain have not improved and muscle relaxer that was prescribed is not helping according to patient. He states that he feels a sharp stabbing pain when walking and even pain when simply turning steering wheel in his car.   States pain persists since starting on or about 12/26.Reports he has been scheduling both muscle relaxant and pain medication with modest improvement. States pain is persistent now and aggravated by turning the steering wheel or similar. Occasionally will radiate to his flank and upper abdomen.   Review of Systems  Genitourinary:       No hx of kidney stones       Objective:   Physical Exam  Constitutional: He appears well-developed and well-nourished. He appears distressed (mild back discomfort).  Genitourinary:  Right cva tenderness on percussion       Assessment:    1. Right-sided thoracic back pain - POCT urinalysis dipstick - DG Abd 2 Views; Future  2. Hematuria - DG Abd 2 Views; Future    Plan:    Provided with urine strainer. Encouraged to push fluids.

## 2015-06-01 NOTE — Patient Instructions (Signed)
Push fluid and strain urine. We will call you with the x-ray results.

## 2015-06-02 ENCOUNTER — Other Ambulatory Visit: Payer: Self-pay | Admitting: Family Medicine

## 2015-06-02 ENCOUNTER — Telehealth: Payer: Self-pay | Admitting: Family Medicine

## 2015-06-02 DIAGNOSIS — R319 Hematuria, unspecified: Secondary | ICD-10-CM

## 2015-06-02 DIAGNOSIS — S39012A Strain of muscle, fascia and tendon of lower back, initial encounter: Secondary | ICD-10-CM

## 2015-06-02 MED ORDER — HYDROCODONE-ACETAMINOPHEN 5-325 MG PO TABS: ORAL_TABLET | ORAL | Status: DC

## 2015-06-02 NOTE — Telephone Encounter (Signed)
Patient saw Daniel [name] please review-aa

## 2015-06-02 NOTE — Telephone Encounter (Signed)
Pt contacted office for refill request on the following medications: HYDROcodone-acetaminophen (NORCO/VICODIN) 5-325 MG tablet. Pt stated that he has enough for today but he will be out by tomorrow and would like to pick this up today if possible. Thanks TNP

## 2015-06-02 NOTE — Telephone Encounter (Signed)
Please review

## 2015-06-02 NOTE — Telephone Encounter (Signed)
Pt called back wanting to know if we got his xray report back yet.  ThanksTeri

## 2015-08-02 ENCOUNTER — Ambulatory Visit (INDEPENDENT_AMBULATORY_CARE_PROVIDER_SITE_OTHER): Payer: Medicare Other | Admitting: Family Medicine

## 2015-08-02 DIAGNOSIS — E785 Hyperlipidemia, unspecified: Secondary | ICD-10-CM

## 2015-08-02 DIAGNOSIS — R319 Hematuria, unspecified: Secondary | ICD-10-CM

## 2015-08-02 DIAGNOSIS — M545 Low back pain: Secondary | ICD-10-CM | POA: Diagnosis not present

## 2015-08-02 DIAGNOSIS — Z09 Encounter for follow-up examination after completed treatment for conditions other than malignant neoplasm: Secondary | ICD-10-CM

## 2015-08-02 DIAGNOSIS — E038 Other specified hypothyroidism: Secondary | ICD-10-CM

## 2015-08-02 DIAGNOSIS — N400 Enlarged prostate without lower urinary tract symptoms: Secondary | ICD-10-CM

## 2015-08-02 LAB — POCT URINALYSIS DIPSTICK
BILIRUBIN UA: NEGATIVE
Blood, UA: NEGATIVE
GLUCOSE UA: NEGATIVE
Ketones, UA: NEGATIVE
Leukocytes, UA: NEGATIVE
Nitrite, UA: NEGATIVE
Protein, UA: NEGATIVE
UROBILINOGEN UA: NEGATIVE
pH, UA: 6.5

## 2015-08-02 NOTE — Progress Notes (Signed)
Patient ID: EDAHI KIRTZ, male   DOB: 04/02/1951, 65 y.o.   MRN: [id number]   ERIEL CHAVEZ  MRN: [id number] DOB: 03-04-1951  Subjective:  HPI  1. Follow up The patient is a 65 year old male who presents for 6 month follow up after his annual wellness exam.  The patient states he is doing well.  He was seen 06/01/15 for back pain and had hematuria.  He was subsequently sent to urology and diagnosed with BPH.  He was started on Tamsulosin daily.  The patient would like to have his urine checked today to make sure the hematuria has cleared.   Patient Active Problem List   Diagnosis Date Noted  . BPH (benign prostatic hyperplasia) 11/21/2014  . Hypothyroidism 11/21/2014  . Obesity 11/21/2014  . Asthma 11/21/2014  . Inguinal hernia 11/21/2014  . OA (osteoarthritis) 11/21/2014  . H/O adenomatous polyp of colon 10/14/2014    Past Medical History  Diagnosis Date  . Seasonal allergies   . Asthma   . Thyroid disease   . Hypothyroidism     Social History   Social History  . Marital Status: Married    Spouse Name: N/A  . Number of Children: N/A  . Years of Education: N/A   Occupational History  . Not on file.   Social History Main Topics  . Smoking status: Former Research scientist (life sciences)  . Smokeless tobacco: Not on file     Comment: quit smoking 25 years ago  . Alcohol Use: Yes     Comment: 1-2 glasses of wine 4-5 times a week  . Drug Use: No  . Sexual Activity: Not on file   Other Topics Concern  . Not on file   Social History Narrative    Outpatient Prescriptions Prior to Visit  Medication Sig Dispense Refill  . finasteride (PROSCAR) 5 MG tablet Take 5 mg by mouth 2 (two) times daily.     Marland Kitchen levothyroxine (SYNTHROID, LEVOTHROID) 100 MCG tablet Take 1 tablet (100 mcg total) by mouth daily before breakfast. 30 tablet 12  . omeprazole (PRILOSEC OTC) 20 MG tablet Take 20 mg by mouth daily.    . Probiotic Product (PROBIOTIC ADVANCED PO) Take by mouth.    . cyclobenzaprine (FLEXERIL) 5  MG tablet Take 1 tablet (5 mg total) by mouth 3 (three) times daily as needed for muscle spasms. 30 tablet 0  . HYDROcodone-acetaminophen (NORCO/VICODIN) 5-325 MG tablet One every 4-6 hours as needed for pain 28 tablet 0   No facility-administered medications prior to visit.    Allergies  Allergen Reactions  . Ciprofloxacin Hcl Other (See Comments)    Blisters break out  . Levaquin [Levofloxacin In D5w]   . Sulfa Antibiotics Other (See Comments)    unknown    Review of Systems  Constitutional: Negative for fever, chills and malaise/fatigue.  Eyes: Negative.   Respiratory: Negative for cough, shortness of breath and wheezing.   Cardiovascular: Negative for chest pain, palpitations, orthopnea and leg swelling.  Gastrointestinal: Negative.   Genitourinary: Negative for dysuria, urgency, frequency, hematuria and flank pain.  Neurological: Negative for dizziness, weakness and headaches.  Endo/Heme/Allergies: Negative.   Psychiatric/Behavioral: Negative.    Objective:  There were no vitals taken for this visit.  Physical Exam  Constitutional: He is oriented to person, place, and time and well-developed, well-nourished, and in no distress.  HENT:  Head: Normocephalic and atraumatic.  Right Ear: External ear normal.  Left Ear: External ear normal.  Nose: Nose normal.  Eyes: Conjunctivae are normal.  Neck: Neck supple. No thyromegaly present.  Cardiovascular: Normal rate, regular rhythm and normal heart sounds.   Pulmonary/Chest: Effort normal and breath sounds normal.  Musculoskeletal: He exhibits edema.  Trace edema bilaterally  Lymphadenopathy:    He has no cervical adenopathy.  Neurological: He is alert and oriented to person, place, and time.  Skin: Skin is warm and dry.  Psychiatric: Mood, memory, affect and judgment normal.    Assessment and Plan :  No diagnosis found. 1. Follow up   2. Hematuria Patient has been referred to urology. - POCT urinalysis dipstick -  CBC with Differential/Platelet  3. BPH (benign prostatic hyperplasia)  - PSA  4. Other specified hypothyroidism  - TSH  5. Low back pain, unspecified back pain laterality, with sciatica presence unspecified  - CBC with Differential/Platelet - Comprehensive metabolic panel  6. Hyperlipemia  - Lipid Panel With LDL/HDL Ratio - Comprehensive metabolic panel  Miguel Aschoff MD [facility] 08/02/2015 9:47 AM

## 2015-08-03 LAB — COMPREHENSIVE METABOLIC PANEL
ALK PHOS: 75 IU/L (ref 39–117)
ALT: 20 IU/L (ref 0–44)
AST: 17 IU/L (ref 0–40)
Albumin/Globulin Ratio: 1.8 (ref 1.1–2.5)
Albumin: 4 g/dL (ref 3.6–4.8)
BILIRUBIN TOTAL: 0.7 mg/dL (ref 0.0–1.2)
BUN/Creatinine Ratio: 9 — ABNORMAL LOW (ref 10–22)
BUN: 9 mg/dL (ref 8–27)
CHLORIDE: 101 mmol/L (ref 96–106)
CO2: 24 mmol/L (ref 18–29)
Calcium: 9 mg/dL (ref 8.6–10.2)
Creatinine, Ser: 0.97 mg/dL (ref 0.76–1.27)
GFR calc Af Amer: 94 mL/min/{1.73_m2} (ref >59)
GFR calc non Af Amer: 81 mL/min/{1.73_m2} (ref >59)
GLUCOSE: 102 mg/dL — AB (ref 65–99)
Globulin, Total: 2.2 g/dL (ref 1.5–4.5)
Potassium: 4.5 mmol/L (ref 3.5–5.2)
Sodium: 139 mmol/L (ref 134–144)
Total Protein: 6.2 g/dL (ref 6.0–8.5)

## 2015-08-03 LAB — CBC WITH DIFFERENTIAL/PLATELET
BASOS: 1 %
Basophils Absolute: 0.1 10*3/uL (ref 0.0–0.2)
EOS (ABSOLUTE): 0.3 10*3/uL (ref 0.0–0.4)
Eos: 5 %
Hematocrit: 47.9 % (ref 37.5–51.0)
Hemoglobin: 16.8 g/dL (ref 12.6–17.7)
Immature Grans (Abs): 0 10*3/uL (ref 0.0–0.1)
Immature Granulocytes: 0 %
Lymphocytes Absolute: 1.2 10*3/uL (ref 0.7–3.1)
Lymphs: 20 %
MCH: 30.3 pg (ref 26.6–33.0)
MCHC: 35.1 g/dL (ref 31.5–35.7)
MCV: 87 fL (ref 79–97)
Monocytes Absolute: 0.7 10*3/uL (ref 0.1–0.9)
Monocytes: 11 %
NEUTROS PCT: 63 %
Neutrophils Absolute: 3.9 10*3/uL (ref 1.4–7.0)
PLATELETS: 230 10*3/uL (ref 150–379)
RBC: 5.54 x10E6/uL (ref 4.14–5.80)
RDW: 14.2 % (ref 12.3–15.4)
WBC: 6.2 10*3/uL (ref 3.4–10.8)

## 2015-08-03 LAB — LIPID PANEL WITH LDL/HDL RATIO
Cholesterol, Total: 180 mg/dL (ref 100–199)
HDL: 53 mg/dL (ref >39)
LDL CALC: 106 mg/dL — AB (ref 0–99)
LDL/HDL RATIO: 2 ratio (ref 0.0–3.6)
Triglycerides: 107 mg/dL (ref 0–149)
VLDL Cholesterol Cal: 21 mg/dL (ref 5–40)

## 2015-08-03 LAB — TSH: TSH: 1.13 u[IU]/mL (ref 0.450–4.500)

## 2015-08-03 LAB — PSA: PROSTATE SPECIFIC AG, SERUM: 0.7 ng/mL (ref 0.0–4.0)

## 2015-09-01 DIAGNOSIS — H0013 Chalazion right eye, unspecified eyelid: Secondary | ICD-10-CM | POA: Diagnosis not present

## 2015-09-29 DIAGNOSIS — L570 Actinic keratosis: Secondary | ICD-10-CM | POA: Diagnosis not present

## 2015-09-29 DIAGNOSIS — L821 Other seborrheic keratosis: Secondary | ICD-10-CM | POA: Diagnosis not present

## 2015-09-29 DIAGNOSIS — L719 Rosacea, unspecified: Secondary | ICD-10-CM | POA: Diagnosis not present

## 2015-10-05 ENCOUNTER — Ambulatory Visit (INDEPENDENT_AMBULATORY_CARE_PROVIDER_SITE_OTHER): Payer: Medicare Other | Admitting: Family Medicine

## 2015-10-05 VITALS — BP 124/80 | HR 64 | Temp 98.6°F | Resp 14 | Wt 230.0 lb

## 2015-10-05 DIAGNOSIS — E785 Hyperlipidemia, unspecified: Secondary | ICD-10-CM

## 2015-10-05 DIAGNOSIS — M545 Low back pain: Secondary | ICD-10-CM

## 2015-10-05 DIAGNOSIS — N400 Enlarged prostate without lower urinary tract symptoms: Secondary | ICD-10-CM

## 2015-10-05 DIAGNOSIS — E038 Other specified hypothyroidism: Secondary | ICD-10-CM | POA: Diagnosis not present

## 2015-10-05 DIAGNOSIS — E669 Obesity, unspecified: Secondary | ICD-10-CM | POA: Diagnosis not present

## 2015-10-05 NOTE — Progress Notes (Signed)
Patient ID: Daniel [name], male   DOB: Apr 23, 1951, 65 y.o.   MRN: [id number]    Subjective:  HPI  Patient is here for follow up. Last visit in March. Had routine labs done and they were stable. Patient was seen for back pain and states this comes and goes still. No hematuria. No urinary issues besides the BPH and he is taking Proscar and Tamsulosin.  Prior to Admission medications   Medication Sig Start Date End Date Taking? Authorizing Provider  finasteride (PROSCAR) 5 MG tablet Take 5 mg by mouth 2 (two) times daily.    Yes Historical Provider, MD  levothyroxine (SYNTHROID, LEVOTHROID) 100 MCG tablet Take 1 tablet (100 mcg total) by mouth daily before breakfast. 03/01/15  Yes Jerrol Banana., MD  omeprazole (PRILOSEC OTC) 20 MG tablet Take 20 mg by mouth daily.   Yes Historical Provider, MD  Probiotic Product (PROBIOTIC ADVANCED PO) Take by mouth.   Yes Historical Provider, MD  tamsulosin (FLOMAX) 0.4 MG CAPS capsule Take 0.4 mg by mouth daily after breakfast.   Yes Historical Provider, MD    Patient Active Problem List   Diagnosis Date Noted  . BPH (benign prostatic hyperplasia) 11/21/2014  . Hypothyroidism 11/21/2014  . Obesity 11/21/2014  . Asthma 11/21/2014  . Inguinal hernia 11/21/2014  . OA (osteoarthritis) 11/21/2014  . H/O adenomatous polyp of colon 10/14/2014    Past Medical History  Diagnosis Date  . Seasonal allergies   . Asthma   . Thyroid disease   . Hypothyroidism     Social History   Social History  . Marital Status: Married    Spouse Name: N/A  . Number of Children: N/A  . Years of Education: N/A   Occupational History  . Not on file.   Social History Main Topics  . Smoking status: Former Research scientist (life sciences)  . Smokeless tobacco: Not on file     Comment: quit smoking 25 years ago  . Alcohol Use: Yes     Comment: 1-2 glasses of wine 4-5 times a week  . Drug Use: No  . Sexual Activity: Not on file   Other Topics Concern  . Not on file   Social  History Narrative    Allergies  Allergen Reactions  . Ciprofloxacin Hcl Other (See Comments)    Blisters break out  . Levaquin [Levofloxacin In D5w]   . Sulfa Antibiotics Other (See Comments)    unknown    Review of Systems  Constitutional: Negative.   Respiratory: Negative.   Cardiovascular: Negative.   Musculoskeletal: Positive for back pain and joint pain.  Psychiatric/Behavioral: Negative.     Immunization History  Administered Date(s) Administered  . Influenza, High Dose Seasonal PF 02/25/2015  . Pneumococcal Conjugate-13 01/27/2015  . Tdap 01/23/2007  . Zoster 05/31/2010   Objective:  BP 124/80 mmHg  Pulse 64  Temp(Src) 98.6 F (37 C)  Resp 14  Wt 230 lb (104.327 kg)  Physical Exam  Constitutional: He is oriented to person, place, and time and well-developed, well-nourished, and in no distress.  HENT:  Head: Normocephalic and atraumatic.  Right Ear: External ear normal.  Left Ear: External ear normal.  Nose: Nose normal.  Eyes: Conjunctivae are normal. Pupils are equal, round, and reactive to light.  Neck: Normal range of motion. Neck supple.  Cardiovascular: Normal rate, regular rhythm, normal heart sounds and intact distal pulses.   No murmur heard. Pulmonary/Chest: Effort normal and breath sounds normal. No respiratory distress. He has no wheezes.  Abdominal: Soft.  Neurological: He is alert and oriented to person, place, and time. Gait normal.  Skin: Skin is warm and dry.  Psychiatric: Mood, memory, affect and judgment normal.    Lab Results  Component Value Date   WBC 6.2 08/02/2015   HGB 17.1 07/28/2014   HCT 47.9 08/02/2015   PLT 230 08/02/2015   GLUCOSE 102* 08/02/2015   CHOL 180 08/02/2015   TRIG 107 08/02/2015   HDL 53 08/02/2015   LDLCALC 106* 08/02/2015   TSH 1.130 08/02/2015    CMP     Component Value Date/Time   NA 139 08/02/2015 1113   K 4.5 08/02/2015 1113   CL 101 08/02/2015 1113   CO2 24 08/02/2015 1113   GLUCOSE 102*  08/02/2015 1113   BUN 9 08/02/2015 1113   CREATININE 0.97 08/02/2015 1113   CREATININE 0.9 07/28/2014   CALCIUM 9.0 08/02/2015 1113   PROT 6.2 08/02/2015 1113   ALBUMIN 4.0 08/02/2015 1113   AST 17 08/02/2015 1113   ALT 20 08/02/2015 1113   ALKPHOS 75 08/02/2015 1113   BILITOT 0.7 08/02/2015 1113   GFRNONAA 81 08/02/2015 1113   GFRAA 94 08/02/2015 1113    Assessment and Plan :  1. Low back pain, unspecified back pain laterality, with sciatica presence unspecified Stable. Has had cystoscope and kidney stones were ruled out. Will follow as needed. May need to try physical therapy to see if that helps.  2. BPH (benign prostatic hyperplasia) Stable on medications that were started by urologist.  3. Adjustment reaction  Patient is a widower as his first wife died about 5 years ago he remarried approximately 3 years ago and his new wife has frontotemporal dementia in her mid 69s. This is been an issue for this year and will only get worse going forward. Discussed with patient how he is doing emotionally with dealing with his wife who has frontal dementia. Patient is doing ok so far. Will continue to follow and check on patient as the disease progresses with his wife. I have done the exam and reviewed the above chart and it is accurate to the best of my knowledge.  Patient was seen and examined by Dr. Eulas Post and note was scribed by Theressa Millard, RMA.    Miguel Aschoff MD [facility] 10/05/2015 9:52 AM

## 2015-11-15 ENCOUNTER — Ambulatory Visit (INDEPENDENT_AMBULATORY_CARE_PROVIDER_SITE_OTHER): Payer: Medicare Other | Admitting: Family Medicine

## 2015-11-15 VITALS — BP 118/82 | HR 76 | Temp 98.7°F | Resp 14 | Wt 228.0 lb

## 2015-11-15 DIAGNOSIS — E669 Obesity, unspecified: Secondary | ICD-10-CM | POA: Diagnosis not present

## 2015-11-15 DIAGNOSIS — G5621 Lesion of ulnar nerve, right upper limb: Secondary | ICD-10-CM | POA: Diagnosis not present

## 2015-11-15 MED ORDER — NAPROXEN 500 MG PO TABS: 500.0000 mg | ORAL_TABLET | Freq: Two times a day (BID) | ORAL | Status: DC | PRN

## 2015-11-15 NOTE — Progress Notes (Signed)
Patient ID: Daniel [name], male   DOB: [birth year], 65 y.o.   MRN: [id number]    Subjective:  HPI  Patient states he has had right elbow pain for about 7 days now. Pain described as sharp stabbing pain when putting weight on that arm against a surface. He has applied ice for about 2 days with no relief. Over the weekend pain resolved so far.  Prior to Admission medications   Medication Sig Start Date End Date Taking? Authorizing Provider  finasteride (PROSCAR) 5 MG tablet Take 5 mg by mouth 2 (two) times daily.    Yes Historical Provider, MD  levothyroxine (SYNTHROID, LEVOTHROID) 100 MCG tablet Take 1 tablet (100 mcg total) by mouth daily before breakfast. 03/01/15  Yes Jerrol Banana., MD  omeprazole (PRILOSEC OTC) 20 MG tablet Take 20 mg by mouth daily.   Yes Historical Provider, MD  Probiotic Product (PROBIOTIC ADVANCED PO) Take by mouth.   Yes Historical Provider, MD  tamsulosin (FLOMAX) 0.4 MG CAPS capsule Take 0.4 mg by mouth daily after breakfast.   Yes Historical Provider, MD    Patient Active Problem List   Diagnosis Date Noted  . BPH (benign prostatic hyperplasia) 11/21/2014  . Hypothyroidism 11/21/2014  . Obesity 11/21/2014  . Asthma 11/21/2014  . Inguinal hernia 11/21/2014  . OA (osteoarthritis) 11/21/2014  . H/O adenomatous polyp of colon 10/14/2014    Past Medical History  Diagnosis Date  . Seasonal allergies   . Asthma   . Thyroid disease   . Hypothyroidism     Social History   Social History  . Marital Status: Married    Spouse Name: N/A  . Number of Children: N/A  . Years of Education: N/A   Occupational History  . Not on file.   Social History Main Topics  . Smoking status: Former Research scientist (life sciences)  . Smokeless tobacco: Not on file     Comment: quit smoking 25 years ago  . Alcohol Use: Yes     Comment: 1-2 glasses of wine 4-5 times a week  . Drug Use: No  . Sexual Activity: Not on file   Other Topics Concern  . Not on file   Social History  Narrative    Allergies  Allergen Reactions  . Ciprofloxacin Hcl Other (See Comments)    Blisters break out  . Levaquin [Levofloxacin In D5w]   . Sulfa Antibiotics Other (See Comments)    unknown    Review of Systems  Constitutional: Negative.   Respiratory: Negative.   Cardiovascular: Negative.   Musculoskeletal: Positive for myalgias and joint pain.    Immunization History  Administered Date(s) Administered  . Influenza, High Dose Seasonal PF 02/25/2015  . Pneumococcal Conjugate-13 01/27/2015  . Tdap 01/23/2007  . Zoster 05/31/2010   Objective:  BP 118/82 mmHg  Pulse 76  Temp(Src) 98.7 F (37.1 C)  Resp 14  Wt 228 lb (103.42 kg)  Physical Exam  Constitutional: He is oriented to person, place, and time and well-developed, well-nourished, and in no distress.  HENT:  Head: Normocephalic and atraumatic.  Eyes: Conjunctivae are normal. Pupils are equal, round, and reactive to light.  Cardiovascular: Normal rate, regular rhythm, normal heart sounds and intact distal pulses.   No murmur heard. Pulmonary/Chest: Effort normal and breath sounds normal. No respiratory distress. He has no wheezes.  Musculoskeletal:  No tenderness of the elbow but the pain is in the groove of ulnar nerve-right side  Neurological: He is alert and oriented to person, place,  and time. No cranial nerve deficit. Coordination normal.    Lab Results  Component Value Date   WBC 6.2 08/02/2015   HGB 17.1 07/28/2014   HCT 47.9 08/02/2015   PLT 230 08/02/2015   GLUCOSE 102* 08/02/2015   CHOL 180 08/02/2015   TRIG 107 08/02/2015   HDL 53 08/02/2015   LDLCALC 106* 08/02/2015   TSH 1.130 08/02/2015    CMP     Component Value Date/Time   NA 139 08/02/2015 1113   K 4.5 08/02/2015 1113   CL 101 08/02/2015 1113   CO2 24 08/02/2015 1113   GLUCOSE 102* 08/02/2015 1113   BUN 9 08/02/2015 1113   CREATININE 0.97 08/02/2015 1113   CREATININE 0.9 07/28/2014   CALCIUM 9.0 08/02/2015 1113   PROT 6.2  08/02/2015 1113   ALBUMIN 4.0 08/02/2015 1113   AST 17 08/02/2015 1113   ALT 20 08/02/2015 1113   ALKPHOS 75 08/02/2015 1113   BILITOT 0.7 08/02/2015 1113   GFRNONAA 81 08/02/2015 1113   GFRAA 94 08/02/2015 1113    Assessment and Plan :  1. Ulnar neuropathy of right upper extremity Better. Will treat it with Naproxen 500 mg 1 BID prn until July and then should be able to stop. Advised patient not to lean against the elbows when driving or at that table. Follow. No xrays are needed at this time. Refer to Ortho if needed. 2. Obesity 3.Adjustment Reaction Wife has dementia and last week had a stroke. She is improving.   Patient was seen and examined by Dr. Eulas Post and note was scribed by Theressa Millard, RMA.  I have done the exam and reviewed the above chart and it is accurate to the best of my knowledge.  Miguel Aschoff MD [facility] 11/15/2015 1:39 PM

## 2015-12-09 DIAGNOSIS — L578 Other skin changes due to chronic exposure to nonionizing radiation: Secondary | ICD-10-CM | POA: Diagnosis not present

## 2015-12-09 DIAGNOSIS — D485 Neoplasm of uncertain behavior of skin: Secondary | ICD-10-CM | POA: Diagnosis not present

## 2015-12-09 DIAGNOSIS — L281 Prurigo nodularis: Secondary | ICD-10-CM | POA: Diagnosis not present

## 2015-12-09 DIAGNOSIS — L821 Other seborrheic keratosis: Secondary | ICD-10-CM | POA: Diagnosis not present

## 2015-12-09 DIAGNOSIS — L570 Actinic keratosis: Secondary | ICD-10-CM | POA: Diagnosis not present

## 2015-12-24 DIAGNOSIS — R350 Frequency of micturition: Secondary | ICD-10-CM | POA: Diagnosis not present

## 2015-12-24 DIAGNOSIS — N401 Enlarged prostate with lower urinary tract symptoms: Secondary | ICD-10-CM | POA: Diagnosis not present

## 2015-12-24 DIAGNOSIS — R3914 Feeling of incomplete bladder emptying: Secondary | ICD-10-CM | POA: Diagnosis not present

## 2015-12-24 DIAGNOSIS — R351 Nocturia: Secondary | ICD-10-CM | POA: Diagnosis not present

## 2015-12-28 DIAGNOSIS — N401 Enlarged prostate with lower urinary tract symptoms: Secondary | ICD-10-CM | POA: Diagnosis not present

## 2015-12-31 DIAGNOSIS — R3915 Urgency of urination: Secondary | ICD-10-CM | POA: Diagnosis not present

## 2015-12-31 DIAGNOSIS — R350 Frequency of micturition: Secondary | ICD-10-CM | POA: Diagnosis not present

## 2015-12-31 DIAGNOSIS — N401 Enlarged prostate with lower urinary tract symptoms: Secondary | ICD-10-CM | POA: Diagnosis not present

## 2015-12-31 DIAGNOSIS — R3914 Feeling of incomplete bladder emptying: Secondary | ICD-10-CM | POA: Diagnosis not present

## 2016-03-03 ENCOUNTER — Other Ambulatory Visit: Payer: Self-pay | Admitting: Family Medicine

## 2016-03-28 ENCOUNTER — Ambulatory Visit (INDEPENDENT_AMBULATORY_CARE_PROVIDER_SITE_OTHER): Payer: Medicare Other | Admitting: Family Medicine

## 2016-03-28 VITALS — BP 118/72 | HR 68 | Temp 97.6°F | Resp 16 | Wt 229.0 lb

## 2016-03-28 DIAGNOSIS — R1084 Generalized abdominal pain: Secondary | ICD-10-CM

## 2016-03-28 MED ORDER — RANITIDINE HCL 150 MG PO TABS: 150.0000 mg | ORAL_TABLET | Freq: Two times a day (BID) | ORAL | Status: DC

## 2016-03-28 NOTE — Progress Notes (Signed)
Daniel [name]  MRN: [id number] DOB: 1951/03/28  Subjective:  HPI   The patient is a 65 year old male who presents for evaluation of possible ulcer.   The patient has been under increased stress with the death of his wife and now having to take over his father in laws finances.  He complains that he is having some esophageal burning, nausea with no vomiting, bloating and diarrhea for the last 2 weeks.  He states it is better at night and after he eats something he gets some temporary relief.  He has a history of having gastritis.  He is taking Prilosec and some Mylaanta and has gotten a little relief from this. He also complains of slight discomfort of his right lower abdominal area.   He has recently started back walking 2 miles daily.  He had been doing this and stopped for a bit while his wife was sick.     Patient Active Problem List   Diagnosis Date Noted  . BPH (benign prostatic hyperplasia) 11/21/2014  . Hypothyroidism 11/21/2014  . Obesity 11/21/2014  . Asthma 11/21/2014  . Inguinal hernia 11/21/2014  . OA (osteoarthritis) 11/21/2014  . H/O adenomatous polyp of colon 10/14/2014    Past Medical History:  Diagnosis Date  . Asthma   . Hypothyroidism   . Seasonal allergies   . Thyroid disease     Social History   Social History  . Marital status: Married    Spouse name: N/A  . Number of children: N/A  . Years of education: N/A   Occupational History  . Not on file.   Social History Main Topics  . Smoking status: Former Research scientist (life sciences)  . Smokeless tobacco: Not on file     Comment: quit smoking 25 years ago  . Alcohol use Yes     Comment: 1-2 glasses of wine 4-5 times a week  . Drug use: No  . Sexual activity: Not on file   Other Topics Concern  . Not on file   Social History Narrative  . No narrative on file    Outpatient Encounter Prescriptions as of 03/28/2016  Medication Sig  . finasteride (PROSCAR) 5 MG tablet Take 5 mg by mouth 2 (two) times daily.   Marland Kitchen  levothyroxine (SYNTHROID, LEVOTHROID) 100 MCG tablet take 1 tablet by mouth once daily BEFORE BREAKFAST  . omeprazole (PRILOSEC OTC) 20 MG tablet Take 20 mg by mouth daily.  . tamsulosin (FLOMAX) 0.4 MG CAPS capsule Take 0.4 mg by mouth daily after breakfast.  . [DISCONTINUED] Probiotic Product (PROBIOTIC ADVANCED PO) Take by mouth.  . [DISCONTINUED] naproxen (NAPROSYN) 500 MG tablet Take 1 tablet (500 mg total) by mouth 2 (two) times daily as needed.   No facility-administered encounter medications on file as of 03/28/2016.     Allergies  Allergen Reactions  . Ciprofloxacin Hcl Other (See Comments)    Blisters break out  . Levaquin [Levofloxacin In D5w]   . Sulfa Antibiotics Other (See Comments)    unknown    Review of Systems  Constitutional: Positive for malaise/fatigue and weight loss (intensional). Negative for chills and fever.  Respiratory: Positive for cough (allergy related). Negative for shortness of breath and wheezing.   Cardiovascular: Negative for chest pain, palpitations, orthopnea, claudication, leg swelling and PND.  Gastrointestinal: Positive for abdominal pain (slight in the right side), diarrhea (minimal), heartburn, nausea and vomiting. Negative for blood in stool, constipation and melena.       No aggravating or alleviating  circumstances for the discomfort.  Musculoskeletal: Positive for joint pain.  Neurological: Negative for dizziness, weakness and headaches.   Objective:  BP 118/72 (BP Location: Right Arm, Patient Position: Sitting, Cuff Size: Normal)   Pulse 68   Temp 97.6 F (36.4 C) (Oral)   Resp 16   Wt 229 lb (103.9 kg)   BMI 30.21 kg/m   Physical Exam  Constitutional: He is oriented to person, place, and time and well-developed, well-nourished, and in no distress.  HENT:  Head: Normocephalic and atraumatic.  Right Ear: External ear normal.  Left Ear: External ear normal.  Nose: Nose normal.  Eyes: Pupils are equal, round, and reactive to  light.  Neck: Normal range of motion. No thyromegaly present.  Cardiovascular: Normal rate, regular rhythm and normal heart sounds.   Pulmonary/Chest: Effort normal and breath sounds normal.  Abdominal: Soft. There is tenderness (mild tenderness in the RUQ, negative Murphy's sign).  Neurological: He is alert and oriented to person, place, and time. No cranial nerve deficit. He exhibits normal muscle tone. Gait normal.  Skin: Skin is warm and dry.  Psychiatric: Mood, memory, affect and judgment normal.    Assessment and Plan :  1. Generalized abdominal pain Consider HIDA scan on follow-up. - Comprehensive metabolic panel - CBC with Differential/Platelet - Amylase - Lipase - H. pylori antibody, IgG - US Abdomen Limited RUQ; Future - ranitidine (ZANTAC) tablet 150 mg; Take 1 tablet (150 mg total) by mouth 2 (two) times daily. 2 BPH 3. Hypothyroid    HPI, Exam and A&P Transcribed under the direction and in the presence of Miguel Aschoff, Brooke Bonito., MD. Electronically Signed: Althea Charon, RMA I have done the exam and reviewed the chart and it is accurate to the best of my knowledge. Miguel Aschoff M.D. [facility]

## 2016-03-29 LAB — CBC WITH DIFFERENTIAL/PLATELET
BASOS: 1 %
Basophils Absolute: 0 10*3/uL (ref 0.0–0.2)
EOS (ABSOLUTE): 0.3 10*3/uL (ref 0.0–0.4)
EOS: 4 %
HEMATOCRIT: 48.3 % (ref 37.5–51.0)
HEMOGLOBIN: 17 g/dL (ref 12.6–17.7)
IMMATURE GRANS (ABS): 0 10*3/uL (ref 0.0–0.1)
IMMATURE GRANULOCYTES: 0 %
LYMPHS: 19 %
Lymphocytes Absolute: 1.4 10*3/uL (ref 0.7–3.1)
MCH: 31.7 pg (ref 26.6–33.0)
MCHC: 35.2 g/dL (ref 31.5–35.7)
MCV: 90 fL (ref 79–97)
MONOCYTES: 10 %
MONOS ABS: 0.8 10*3/uL (ref 0.1–0.9)
NEUTROS PCT: 66 %
Neutrophils Absolute: 5 10*3/uL (ref 1.4–7.0)
Platelets: 224 10*3/uL (ref 150–379)
RBC: 5.36 x10E6/uL (ref 4.14–5.80)
RDW: 14 % (ref 12.3–15.4)
WBC: 7.5 10*3/uL (ref 3.4–10.8)

## 2016-03-29 LAB — H. PYLORI ANTIBODY, IGG

## 2016-03-29 LAB — COMPREHENSIVE METABOLIC PANEL
ALK PHOS: 77 IU/L (ref 39–117)
ALT: 30 IU/L (ref 0–44)
AST: 23 IU/L (ref 0–40)
Albumin/Globulin Ratio: 1.9 (ref 1.2–2.2)
Albumin: 4.4 g/dL (ref 3.6–4.8)
BUN/Creatinine Ratio: 16 (ref 10–24)
BUN: 14 mg/dL (ref 8–27)
Bilirubin Total: 1 mg/dL (ref 0.0–1.2)
CALCIUM: 9.4 mg/dL (ref 8.6–10.2)
CO2: 28 mmol/L (ref 18–29)
CREATININE: 0.85 mg/dL (ref 0.76–1.27)
Chloride: 100 mmol/L (ref 96–106)
GFR calc Af Amer: 105 mL/min/{1.73_m2} (ref >59)
GFR, EST NON AFRICAN AMERICAN: 91 mL/min/{1.73_m2} (ref >59)
GLOBULIN, TOTAL: 2.3 g/dL (ref 1.5–4.5)
GLUCOSE: 92 mg/dL (ref 65–99)
Potassium: 4.4 mmol/L (ref 3.5–5.2)
SODIUM: 141 mmol/L (ref 134–144)
Total Protein: 6.7 g/dL (ref 6.0–8.5)

## 2016-03-29 LAB — LIPASE: LIPASE: 19 U/L (ref 13–78)

## 2016-03-29 LAB — AMYLASE: Amylase: 43 U/L (ref 31–124)

## 2016-03-30 DIAGNOSIS — L821 Other seborrheic keratosis: Secondary | ICD-10-CM | POA: Diagnosis not present

## 2016-03-30 DIAGNOSIS — L718 Other rosacea: Secondary | ICD-10-CM | POA: Diagnosis not present

## 2016-03-30 DIAGNOSIS — L570 Actinic keratosis: Secondary | ICD-10-CM | POA: Diagnosis not present

## 2016-03-30 DIAGNOSIS — L578 Other skin changes due to chronic exposure to nonionizing radiation: Secondary | ICD-10-CM | POA: Diagnosis not present

## 2016-03-30 DIAGNOSIS — L820 Inflamed seborrheic keratosis: Secondary | ICD-10-CM | POA: Diagnosis not present

## 2016-04-03 ENCOUNTER — Ambulatory Visit
Admission: RE | Admit: 2016-04-03 | Discharge: 2016-04-03 | Disposition: A | Discharge status: Home / Self Care | Payer: Medicare Other | Source: Ambulatory Visit | Attending: Family Medicine | Admitting: Family Medicine

## 2016-04-03 DIAGNOSIS — R1084 Generalized abdominal pain: Secondary | ICD-10-CM | POA: Insufficient documentation

## 2016-04-03 DIAGNOSIS — R109 Unspecified abdominal pain: Secondary | ICD-10-CM | POA: Diagnosis not present

## 2016-04-06 ENCOUNTER — Ambulatory Visit (INDEPENDENT_AMBULATORY_CARE_PROVIDER_SITE_OTHER): Payer: Medicare Other | Admitting: Family Medicine

## 2016-04-06 VITALS — BP 114/72 | HR 82 | Temp 98.5°F | Resp 16 | Wt 229.0 lb

## 2016-04-06 DIAGNOSIS — E039 Hypothyroidism, unspecified: Secondary | ICD-10-CM

## 2016-04-06 DIAGNOSIS — K219 Gastro-esophageal reflux disease without esophagitis: Secondary | ICD-10-CM | POA: Diagnosis not present

## 2016-04-06 DIAGNOSIS — N400 Enlarged prostate without lower urinary tract symptoms: Secondary | ICD-10-CM | POA: Diagnosis not present

## 2016-04-06 DIAGNOSIS — Z0001 Encounter for general adult medical examination with abnormal findings: Secondary | ICD-10-CM | POA: Diagnosis not present

## 2016-04-06 DIAGNOSIS — R109 Unspecified abdominal pain: Secondary | ICD-10-CM | POA: Diagnosis not present

## 2016-04-06 NOTE — Progress Notes (Signed)
Patient: Daniel [name], Male    DOB: 09-16-1950, 65 y.o.   MRN: [id number] Visit Date: 04/06/2016  Today's Provider: Wilhemena Durie, MD   Chief Complaint  Patient presents with  . Annual Exam   Subjective:   Daniel [name] is a 65 y.o. male who presents today for his Subsequent Annual Wellness Visit. He feels well. He reports exercising 6 days per week. He reports he is sleeping well. He is appropriately mourning the recent death of his 44 year old wife from rapidly progressive dementia. His first wife died 5 years ago from severe lung disease. He has completely recovered from his recent visit with abdominal symptoms but the workup revealed a fatty liver versus hepatocellular disease. 11/18/14 Colonoscopy-polyps, f/u every 5 years.  Review of Systems  Constitutional: Negative.   HENT: Negative.   Respiratory: Negative.   Cardiovascular: Negative.   Gastrointestinal: Positive for abdominal pain.       Bloating  Endocrine: Negative.   Genitourinary: Negative.   Musculoskeletal: Positive for back pain.  Skin: Negative.   Allergic/Immunologic: Positive for environmental allergies.  Neurological: Negative.   Hematological: Negative.   Psychiatric/Behavioral: Negative.     Patient Active Problem List   Diagnosis Date Noted  . BPH (benign prostatic hyperplasia) 11/21/2014  . Hypothyroidism 11/21/2014  . Obesity 11/21/2014  . Asthma 11/21/2014  . Inguinal hernia 11/21/2014  . OA (osteoarthritis) 11/21/2014  . H/O adenomatous polyp of colon 10/14/2014    Social History   Social History  . Marital status: Widowed    Spouse name: N/A  . Number of children: N/A  . Years of education: N/A   Occupational History  . Not on file.   Social History Main Topics  . Smoking status: Former Research scientist (life sciences)  . Smokeless tobacco: Not on file     Comment: quit smoking 25 years ago  . Alcohol use Yes     Comment: 1-2 glasses of wine 4-5 times a week  . Drug use: No  . Sexual activity: Not  on file   Other Topics Concern  . Not on file   Social History Narrative  . No narrative on file    Past Surgical History:  Procedure Laterality Date  . CATARACT EXTRACTION    . COLON SURGERY    . COLONOSCOPY WITH PROPOFOL N/A 11/18/2014   Procedure: COLONOSCOPY WITH PROPOFOL;  Surgeon: [name], MD;  [facility] ENDOSCOPY;  Service: Endoscopy;  Laterality: N/A;  . EYE SURGERY    . KNEE SURGERY    . NASAL SEPTUM SURGERY      His family history includes Arthritis in his brother; Cervical cancer in his mother; Colon cancer in his mother; Colon polyps in his mother; Heart attack in his brother; Hypertension in his father; Leukemia in his father; Osteoporosis in his mother; Throat cancer in his maternal grandmother.     Outpatient Encounter Prescriptions as of 04/06/2016  Medication Sig  . finasteride (PROSCAR) 5 MG tablet Take 5 mg by mouth 2 (two) times daily.   Marland Kitchen levothyroxine (SYNTHROID, LEVOTHROID) 100 MCG tablet take 1 tablet by mouth once daily BEFORE BREAKFAST  . omeprazole (PRILOSEC OTC) 20 MG tablet Take 20 mg by mouth daily.  . tamsulosin (FLOMAX) 0.4 MG CAPS capsule Take 0.4 mg by mouth daily after breakfast.   Facility-Administered Encounter Medications as of 04/06/2016  Medication  . ranitidine (ZANTAC) tablet 150 mg    Allergies  Allergen Reactions  . Ciprofloxacin Hcl Other (See Comments)    Blisters  break out  . Levaquin [Levofloxacin In D5w]   . Sulfa Antibiotics Other (See Comments)    unknown    Patient Care Team: Jerrol Banana., MD as PCP - General (Family Medicine)   Objective:   Vitals:  Vitals:   04/06/16 1409  BP: 114/72  Pulse: 82  Resp: 16  Temp: 98.5 F (36.9 C)  TempSrc: Oral  Weight: 229 lb (103.9 kg)    Physical Exam  Constitutional: He is oriented to person, place, and time. He appears well-developed and well-nourished.  HENT:  Head: Normocephalic and atraumatic.  Right Ear: External ear normal.  Left Ear:  External ear normal.  Nose: Nose normal.  Mouth/Throat: Oropharynx is clear and moist.  Eyes: Conjunctivae and EOM are normal. Pupils are equal, round, and reactive to light.  Neck: Normal range of motion. Neck supple.  Cardiovascular: Normal rate, regular rhythm, normal heart sounds and intact distal pulses.   Pulmonary/Chest: Effort normal and breath sounds normal.  Abdominal: Soft. Bowel sounds are normal.  Genitourinary: Rectum normal, prostate normal and penis normal.  Musculoskeletal: Normal range of motion.  Neurological: He is alert and oriented to person, place, and time.  Skin: Skin is warm and dry.  Psychiatric: He has a normal mood and affect. His behavior is normal. Judgment and thought content normal.    Activities of Daily Living In your present state of health, do you have any difficulty performing the following activities: 04/06/2016  Hearing? Y  Vision? N  Difficulty concentrating or making decisions? N  Walking or climbing stairs? N  Dressing or bathing? N  Doing errands, shopping? N  Some recent data might be hidden    Fall Risk Assessment Fall Risk  04/06/2016 01/27/2015  Falls in the past year? No No     Depression Screen PHQ 2/9 Scores 04/06/2016 01/27/2015  PHQ - 2 Score 0 0   Current Exercise Habits: Home exercise routine, Type of exercise: walking, Time (Minutes): 40, Frequency (Times/Week): 6, Weekly Exercise (Minutes/Week): 240    Cognitive Testing - 6-CIT    Year: 0 4 points  Month: 0 3 points  Memorize "Pia Mau, [address] [place]"  Time (within 1 hour:) 0 3 points  Count backwards from 20: 0 2 4 points  Name months of year: 0 2 4 points  Repeat Address: 0 2 4 6 8 10  points   Total Score: 4/28  Interpretation : Normal (0-7) Abnormal (8-28)    Assessment & Plan:     Annual Wellness Visit  Reviewed patient's Family Medical History Reviewed and updated list of patient's medical providers Assessment of cognitive impairment was  done Assessed patient's functional ability Established a written schedule for health screening [place] Completed and Reviewed  Exercise Activities and Dietary recommendations Goals    None      Immunization History  Administered Date(s) Administered  . Influenza, High Dose Seasonal PF 02/25/2015  . Pneumococcal Conjugate-13 01/27/2015  . Tdap 01/23/2007  . Zoster 05/31/2010    Health Maintenance  Topic Date Due  . Hepatitis C Screening  03/20/1951  . INFLUENZA VACCINE  12/28/2015  . PNA vac Low Risk Adult (2 of 2 - PPSV23) 01/27/2016  . TETANUS/TDAP  01/22/2017  . COLONOSCOPY  11/17/2024  . ZOSTAVAX  Completed     Discussed health benefits of physical activity, and encouraged him to engage in regular exercise appropriate for his age and condition.  Fatty liver versus hepatocellular disease Refer to GI for  evaluation. She feels well. BPH GERD Hypothyroid Adjustment reaction with the recent death of his wife  I have done the exam and reviewed the chart and it is accurate to the best of my knowledge. Development worker, community has been used and  any errors in dictation or transcription are unintentional. Miguel Aschoff M.D. [facility]

## 2016-05-02 DIAGNOSIS — K219 Gastro-esophageal reflux disease without esophagitis: Secondary | ICD-10-CM | POA: Diagnosis not present

## 2016-05-02 DIAGNOSIS — K760 Fatty (change of) liver, not elsewhere classified: Secondary | ICD-10-CM | POA: Diagnosis not present

## 2016-05-09 ENCOUNTER — Other Ambulatory Visit: Payer: Self-pay | Admitting: Nurse Practitioner

## 2016-05-09 DIAGNOSIS — K760 Fatty (change of) liver, not elsewhere classified: Secondary | ICD-10-CM

## 2016-05-12 ENCOUNTER — Ambulatory Visit
Admission: RE | Admit: 2016-05-12 | Discharge: 2016-05-12 | Disposition: A | Discharge status: Home / Self Care | Payer: Medicare Other | Source: Ambulatory Visit | Attending: Nurse Practitioner | Admitting: Nurse Practitioner

## 2016-05-12 DIAGNOSIS — K760 Fatty (change of) liver, not elsewhere classified: Secondary | ICD-10-CM | POA: Diagnosis not present

## 2016-05-18 ENCOUNTER — Telehealth: Payer: Self-pay | Admitting: Family Medicine

## 2016-05-18 NOTE — Telephone Encounter (Signed)
eror

## 2016-05-21 DIAGNOSIS — J069 Acute upper respiratory infection, unspecified: Secondary | ICD-10-CM | POA: Diagnosis not present

## 2016-05-23 ENCOUNTER — Ambulatory Visit: Payer: Self-pay | Admitting: Family Medicine

## 2016-05-23 ENCOUNTER — Ambulatory Visit: Payer: Medicare Other | Admitting: Family Medicine

## 2016-05-24 ENCOUNTER — Encounter: Payer: Self-pay | Admitting: Family Medicine

## 2016-05-24 ENCOUNTER — Ambulatory Visit (INDEPENDENT_AMBULATORY_CARE_PROVIDER_SITE_OTHER): Payer: Medicare Other | Admitting: Family Medicine

## 2016-05-24 VITALS — BP 112/74 | HR 72 | Temp 98.5°F | Resp 16 | Wt 228.8 lb

## 2016-05-24 DIAGNOSIS — B9789 Other viral agents as the cause of diseases classified elsewhere: Secondary | ICD-10-CM | POA: Diagnosis not present

## 2016-05-24 DIAGNOSIS — J069 Acute upper respiratory infection, unspecified: Secondary | ICD-10-CM | POA: Diagnosis not present

## 2016-05-24 MED ORDER — AMOXICILLIN-POT CLAVULANATE 875-125 MG PO TABS: 1.0000 | ORAL_TABLET | Freq: Two times a day (BID) | ORAL | 0 refills | Status: DC

## 2016-05-24 NOTE — Patient Instructions (Signed)
Start Mucinex D for congestion and Delsym for cough if needed. If sinuses not improving over the next 2 days to start the antibiotic.

## 2016-05-24 NOTE — Progress Notes (Signed)
Subjective:     Patient ID: Daniel [name], male   DOB: [birth year], 65 y.o.   MRN: [id number]  HPI  Chief Complaint  Patient presents with  . Sore Throat    Patient comes in office today ith concerns of sore throat for the past week. Associated with sore throat patient complains of cough productive of yellow/green sputum, congestion and swelling of lymph nodes. Patient states that he has used throat spray otc and Naproxen for relief.   States he went to Urgent Care on 12/25 and was dx'ed with a viral URI and treated symptomatically. States sinuses remain congested but drainage is clear.   Review of Systems     Objective:   Physical Exam  Constitutional: He appears well-developed and well-nourished. No distress.  Ears: T.M's intact without inflammation Sinuses: mild paranasal sinus tenderness Throat: no tonsillar enlargement or exudate. Mild posterior pharyngeal erythema Neck: no cervical adenopathy Lungs: clear     Assessment:    1. Viral upper respiratory tract infection - amoxicillin-clavulanate (AUGMENTIN) 875-125 MG tablet; Take 1 tablet by mouth 2 (two) times daily.  Dispense: 20 tablet; Refill: 0    Plan:    Discussed use of Mucinex D and Delsym. To start abx if sinuses not improving over the next two days.

## 2016-05-31 LAB — HM COLONOSCOPY

## 2016-06-19 ENCOUNTER — Ambulatory Visit: Payer: Medicare Other | Admitting: Family Medicine

## 2016-06-20 DIAGNOSIS — K319 Disease of stomach and duodenum, unspecified: Secondary | ICD-10-CM | POA: Diagnosis not present

## 2016-06-20 DIAGNOSIS — K2990 Gastroduodenitis, unspecified, without bleeding: Secondary | ICD-10-CM | POA: Diagnosis not present

## 2016-06-20 DIAGNOSIS — K2960 Other gastritis without bleeding: Secondary | ICD-10-CM | POA: Diagnosis not present

## 2016-06-20 DIAGNOSIS — R12 Heartburn: Secondary | ICD-10-CM | POA: Diagnosis not present

## 2016-06-20 DIAGNOSIS — K2970 Gastritis, unspecified, without bleeding: Secondary | ICD-10-CM | POA: Diagnosis not present

## 2016-06-20 DIAGNOSIS — K2950 Unspecified chronic gastritis without bleeding: Secondary | ICD-10-CM | POA: Diagnosis not present

## 2016-06-20 DIAGNOSIS — K449 Diaphragmatic hernia without obstruction or gangrene: Secondary | ICD-10-CM | POA: Diagnosis not present

## 2016-06-20 DIAGNOSIS — R1013 Epigastric pain: Secondary | ICD-10-CM | POA: Diagnosis not present

## 2016-06-22 ENCOUNTER — Ambulatory Visit: Payer: Medicare Other | Admitting: Family Medicine

## 2016-06-26 DIAGNOSIS — Z23 Encounter for immunization: Secondary | ICD-10-CM | POA: Diagnosis not present

## 2016-06-27 ENCOUNTER — Other Ambulatory Visit: Payer: Self-pay

## 2016-06-30 DIAGNOSIS — H903 Sensorineural hearing loss, bilateral: Secondary | ICD-10-CM | POA: Diagnosis not present

## 2016-07-06 ENCOUNTER — Encounter: Payer: Self-pay | Admitting: Family Medicine

## 2016-07-07 ENCOUNTER — Encounter: Payer: Self-pay | Admitting: Family Medicine

## 2016-08-16 ENCOUNTER — Ambulatory Visit: Payer: Self-pay | Admitting: Family Medicine

## 2016-08-17 ENCOUNTER — Ambulatory Visit (INDEPENDENT_AMBULATORY_CARE_PROVIDER_SITE_OTHER): Payer: Medicare Other | Admitting: Family Medicine

## 2016-08-17 VITALS — BP 98/62 | HR 72 | Temp 98.8°F | Resp 18 | Wt 225.0 lb

## 2016-08-17 DIAGNOSIS — R52 Pain, unspecified: Secondary | ICD-10-CM

## 2016-08-17 DIAGNOSIS — J101 Influenza due to other identified influenza virus with other respiratory manifestations: Secondary | ICD-10-CM | POA: Diagnosis not present

## 2016-08-17 LAB — POCT INFLUENZA A/B
INFLUENZA A, POC: NEGATIVE
Influenza B, POC: POSITIVE — AB

## 2016-08-17 MED ORDER — OSELTAMIVIR PHOSPHATE 75 MG PO CAPS: 75.0000 mg | ORAL_CAPSULE | Freq: Two times a day (BID) | ORAL | 0 refills | Status: DC

## 2016-08-17 NOTE — Progress Notes (Signed)
Daniel [name]  MRN: [id number] DOB: 04/26/1951  Subjective:  HPI  Patient is here due to not feeling good. He had a fever last night and this morning and it was around 99.5-99.8. Has headache, congestion, body aches, chest tightness, cough-with yellowish phlegm. Had some dizziness this morning. He did have a flu shot in January 2018. Patient Active Problem List   Diagnosis Date Noted  . BPH (benign prostatic hyperplasia) 11/21/2014  . Hypothyroidism 11/21/2014  . Obesity 11/21/2014  . Asthma 11/21/2014  . Inguinal hernia 11/21/2014  . OA (osteoarthritis) 11/21/2014  . H/O adenomatous polyp of colon 10/14/2014    Past Medical History:  Diagnosis Date  . Asthma   . Hypothyroidism   . Seasonal allergies   . Thyroid disease     Social History   Social History  . Marital status: Widowed    Spouse name: N/A  . Number of children: N/A  . Years of education: N/A   Occupational History  . Not on file.   Social History Main Topics  . Smoking status: Former Research scientist (life sciences)  . Smokeless tobacco: Not on file     Comment: quit smoking 25 years ago  . Alcohol use Yes     Comment: 1-2 glasses of wine 4-5 times a week  . Drug use: No  . Sexual activity: Not on file   Other Topics Concern  . Not on file   Social History Narrative  . No narrative on file    Outpatient Encounter Prescriptions as of 08/17/2016  Medication Sig Note  . finasteride (PROSCAR) 5 MG tablet Take 5 mg by mouth 2 (two) times daily.    . Ginkgo Biloba 40 MG TABS Take by mouth. 05/24/2016: Received from: [facility]: Take by mouth once daily.  Marland Kitchen levothyroxine (SYNTHROID, LEVOTHROID) 100 MCG tablet take 1 tablet by mouth once daily BEFORE BREAKFAST   . omeprazole (PRILOSEC OTC) 20 MG tablet Take 20 mg by mouth daily.   Marland Kitchen AFLURIA QUADRIVALENT 0.5 ML injection inject 0.5 milliliter intramuscularly   . [DISCONTINUED] amoxicillin-clavulanate (AUGMENTIN) 875-125 MG tablet Take 1 tablet  by mouth 2 (two) times daily.   . [DISCONTINUED] lidocaine (XYLOCAINE) 2 % solution SWISH AND SPIT 5 MILLILITERS BY MOUTH EVERY 6 HOURS 05/24/2016: Received from: External Pharmacy  . [DISCONTINUED] tamsulosin (FLOMAX) 0.4 MG CAPS capsule Take 0.4 mg by mouth daily after breakfast.    Facility-Administered Encounter Medications as of 08/17/2016  Medication  . ranitidine (ZANTAC) tablet 150 mg    Allergies  Allergen Reactions  . Ciprofloxacin Hcl Other (See Comments)    Blisters break out  . Levaquin [Levofloxacin In D5w]   . Sulfa Antibiotics Other (See Comments)    unknown    Review of Systems  Constitutional: Positive for fever and malaise/fatigue.  HENT: Positive for congestion.   Eyes: Negative.   Respiratory: Positive for cough and sputum production.   Cardiovascular: Negative.   Gastrointestinal: Negative.   Musculoskeletal: Positive for joint pain and myalgias.  Neurological: Positive for dizziness and headaches.  Endo/Heme/Allergies: Negative.     Objective:  BP 98/62   Pulse 72   Temp 98.8 F (37.1 C)   Resp 18   Wt 225 lb (102.1 kg)   SpO2 93%   BMI 29.69 kg/m   Physical Exam  Constitutional: He is oriented to person, place, and time and well-developed, well-nourished, and in no distress.  HENT:  Head: Normocephalic and atraumatic.  Right Ear: External ear normal.  Left Ear: External ear normal.  Nose: Nose normal.  Mouth/Throat: Oropharynx is clear and moist.  Eyes: Conjunctivae are normal. No scleral icterus.  Neck: No thyromegaly present.  Cardiovascular: Normal rate, regular rhythm and normal heart sounds.   Pulmonary/Chest: Effort normal and breath sounds normal.  Abdominal: Soft.  Neurological: He is alert and oriented to person, place, and time. Gait normal. GCS score is 15.  Skin: Skin is warm and dry.  Psychiatric: Mood, memory, affect and judgment normal.    Assessment and Plan :  1. Body aches Flu B positive. Flu A negative. - POCT  Influenza A/B  2. Influenza B Treat with Tamiflu. Follow as needed.  HPI, Exam and A&P transcribed under direction and in the presence of Miguel Aschoff, MD. I have done the exam and reviewed the chart and it is accurate to the best of my knowledge. Development worker, community has been used and  any errors in dictation or transcription are unintentional. Miguel Aschoff M.D. [facility]

## 2016-08-22 DIAGNOSIS — N401 Enlarged prostate with lower urinary tract symptoms: Secondary | ICD-10-CM | POA: Diagnosis not present

## 2016-08-22 DIAGNOSIS — R361 Hematospermia: Secondary | ICD-10-CM | POA: Diagnosis not present

## 2016-08-22 DIAGNOSIS — R3914 Feeling of incomplete bladder emptying: Secondary | ICD-10-CM | POA: Diagnosis not present

## 2016-08-22 DIAGNOSIS — N5201 Erectile dysfunction due to arterial insufficiency: Secondary | ICD-10-CM | POA: Diagnosis not present

## 2016-08-31 ENCOUNTER — Ambulatory Visit (INDEPENDENT_AMBULATORY_CARE_PROVIDER_SITE_OTHER): Payer: Medicare Other | Admitting: Family Medicine

## 2016-08-31 ENCOUNTER — Encounter: Payer: Self-pay | Admitting: Family Medicine

## 2016-08-31 ENCOUNTER — Ambulatory Visit: Payer: Self-pay | Admitting: Family Medicine

## 2016-08-31 VITALS — BP 110/76 | HR 71 | Temp 98.6°F | Resp 16 | Wt 229.0 lb

## 2016-08-31 DIAGNOSIS — S30863A Insect bite (nonvenomous) of scrotum and testes, initial encounter: Secondary | ICD-10-CM

## 2016-08-31 DIAGNOSIS — W57XXXA Bitten or stung by nonvenomous insect and other nonvenomous arthropods, initial encounter: Secondary | ICD-10-CM | POA: Diagnosis not present

## 2016-08-31 NOTE — Patient Instructions (Signed)
Discussed use of hydrocortisone cream and monitor for further rashes or flu like symptoms over the next few weeks.

## 2016-08-31 NOTE — Progress Notes (Signed)
Subjective:     Patient ID: Daniel [name], male   DOB: Jun 02, 1950, 66 y.o.   MRN: [id number]  HPI  Chief Complaint  Patient presents with  . Tick Removal    Patient comes in office today with concerns of tick bite to his scrotum on 08/29/16. Patient states that tick was small and brownish/reddish in color, patient pulled of tick with a pair of tweezers. Patient reports a small bump/knot where tick had attatched to skin. Patient states that he has been doing normal activities and walking dog, he denies being in woods or working in garden areas.    Denies itching or flu like sx. States he believes the tick was attached for only a few hours.   Review of Systems     Objective:   Physical Exam  Constitutional: He appears well-developed and well-nourished. No distress.  Skin:  3-4 mm. Scrotal papules. No f.b.noted or inflammatory flare.       Assessment:    1. Tick bite of scrotum, initial encounter    Plan:    Discussed use of hydrocortisone cream and monitoring for expanding rash or flu like sx.

## 2016-09-04 ENCOUNTER — Encounter: Payer: Self-pay | Admitting: Family Medicine

## 2016-09-04 ENCOUNTER — Ambulatory Visit (INDEPENDENT_AMBULATORY_CARE_PROVIDER_SITE_OTHER): Payer: Medicare Other | Admitting: Family Medicine

## 2016-09-04 VITALS — BP 106/72 | HR 69 | Temp 98.8°F | Resp 16 | Wt 228.0 lb

## 2016-09-04 DIAGNOSIS — W57XXXD Bitten or stung by nonvenomous insect and other nonvenomous arthropods, subsequent encounter: Secondary | ICD-10-CM | POA: Diagnosis not present

## 2016-09-04 DIAGNOSIS — B349 Viral infection, unspecified: Secondary | ICD-10-CM | POA: Diagnosis not present

## 2016-09-04 DIAGNOSIS — S30863D Insect bite (nonvenomous) of scrotum and testes, subsequent encounter: Secondary | ICD-10-CM | POA: Diagnosis not present

## 2016-09-04 LAB — POC INFLUENZA A&B (BINAX/QUICKVUE)
Influenza A, POC: NEGATIVE
Influenza B, POC: NEGATIVE

## 2016-09-04 MED ORDER — DOXYCYCLINE HYCLATE 100 MG PO TABS: 100.0000 mg | ORAL_TABLET | Freq: Two times a day (BID) | ORAL | 1 refills | Status: DC

## 2016-09-04 NOTE — Patient Instructions (Signed)
Discussed use of Mucinex D.

## 2016-09-04 NOTE — Progress Notes (Signed)
Subjective:     Patient ID: Daniel [name], male   DOB: November 04, 1950, 66 y.o.   MRN: [id number]  HPI  Chief Complaint  Patient presents with  . Follow-up    Patient returns to office today to follow up for tick bite on 08/29/16, patient last office visit was 08/31/16 and we discuss use of hydrocortisone cream and monitoring for symptoms. Patient reports on 09/03/16 he ran a fever of 100.2 he took otc Alka-Seltzer cough and cold. Patient reports symptoms today of headache, dizziness and fatigue. Patient denies any symptoms of itchy, swelling or rash.   Reports sinus congestion but no cough. Had flu B in March. Reports site of tick bite is unremarkable.   Review of Systems     Objective:   Physical Exam  Constitutional: He appears well-developed and well-nourished. No distress.  Ears: T.M's intact without inflammation Throat: no tonsillar enlargement or exudate Neck: no cervical adenopathy Lungs: clear     Assessment:    1. Viral syndrome: will cover for tick fever - POC Influenza A&B(BINAX/QUICKVUE) - doxycycline (VIBRA-TABS) 100 MG tablet; Take 1 tablet (100 mg total) by mouth 2 (two) times daily.  Dispense: 14 tablet; Refill: 1  2. Tick bite of scrotum, subsequent encounter - doxycycline (VIBRA-TABS) 100 MG tablet; Take 1 tablet (100 mg total) by mouth 2 (two) times daily.  Dispense: 14 tablet; Refill:     Plan:    Add Mucinex D for congestion.

## 2016-10-10 DIAGNOSIS — H0013 Chalazion right eye, unspecified eyelid: Secondary | ICD-10-CM | POA: Diagnosis not present

## 2016-10-10 DIAGNOSIS — H43813 Vitreous degeneration, bilateral: Secondary | ICD-10-CM | POA: Diagnosis not present

## 2016-10-19 DIAGNOSIS — L919 Hypertrophic disorder of the skin, unspecified: Secondary | ICD-10-CM | POA: Diagnosis not present

## 2016-10-19 DIAGNOSIS — H02823 Cysts of right eye, unspecified eyelid: Secondary | ICD-10-CM | POA: Diagnosis not present

## 2016-11-07 DIAGNOSIS — L570 Actinic keratosis: Secondary | ICD-10-CM | POA: Diagnosis not present

## 2016-11-07 DIAGNOSIS — D1800 Hemangioma unspecified site: Secondary | ICD-10-CM | POA: Diagnosis not present

## 2016-11-07 DIAGNOSIS — D179 Benign lipomatous neoplasm, unspecified: Secondary | ICD-10-CM | POA: Diagnosis not present

## 2016-11-07 DIAGNOSIS — L821 Other seborrheic keratosis: Secondary | ICD-10-CM | POA: Diagnosis not present

## 2017-01-01 DIAGNOSIS — R350 Frequency of micturition: Secondary | ICD-10-CM | POA: Diagnosis not present

## 2017-01-01 DIAGNOSIS — N401 Enlarged prostate with lower urinary tract symptoms: Secondary | ICD-10-CM | POA: Diagnosis not present

## 2017-01-01 DIAGNOSIS — R3914 Feeling of incomplete bladder emptying: Secondary | ICD-10-CM | POA: Diagnosis not present

## 2017-01-01 DIAGNOSIS — R351 Nocturia: Secondary | ICD-10-CM | POA: Diagnosis not present

## 2017-02-08 ENCOUNTER — Ambulatory Visit: Payer: Medicare Other

## 2017-02-15 ENCOUNTER — Ambulatory Visit (INDEPENDENT_AMBULATORY_CARE_PROVIDER_SITE_OTHER): Payer: Medicare Other

## 2017-02-15 DIAGNOSIS — Z23 Encounter for immunization: Secondary | ICD-10-CM | POA: Diagnosis not present

## 2017-03-15 ENCOUNTER — Telehealth: Payer: Self-pay | Admitting: Family Medicine

## 2017-03-15 ENCOUNTER — Ambulatory Visit: Payer: Medicare Other

## 2017-03-15 NOTE — Telephone Encounter (Signed)
Needs refills on Levothyroxine to [facility] at [facility].

## 2017-03-16 MED ORDER — LEVOTHYROXINE SODIUM 100 MCG PO TABS: ORAL_TABLET | ORAL | 2 refills | Status: DC

## 2017-04-11 ENCOUNTER — Other Ambulatory Visit: Payer: Self-pay

## 2017-04-11 ENCOUNTER — Ambulatory Visit (INDEPENDENT_AMBULATORY_CARE_PROVIDER_SITE_OTHER): Payer: Medicare Other | Admitting: Family Medicine

## 2017-04-11 VITALS — BP 120/82 | HR 68 | Temp 98.6°F | Resp 16 | Wt 227.0 lb

## 2017-04-11 DIAGNOSIS — Z0000 Encounter for general adult medical examination without abnormal findings: Secondary | ICD-10-CM | POA: Diagnosis not present

## 2017-04-11 DIAGNOSIS — K649 Unspecified hemorrhoids: Secondary | ICD-10-CM | POA: Diagnosis not present

## 2017-04-11 DIAGNOSIS — K625 Hemorrhage of anus and rectum: Secondary | ICD-10-CM | POA: Diagnosis not present

## 2017-04-11 DIAGNOSIS — E785 Hyperlipidemia, unspecified: Secondary | ICD-10-CM | POA: Diagnosis not present

## 2017-04-11 DIAGNOSIS — E038 Other specified hypothyroidism: Secondary | ICD-10-CM

## 2017-04-11 MED ORDER — HYDROCORTISONE ACETATE 25 MG RE SUPP: 25.0000 mg | Freq: Two times a day (BID) | RECTAL | 0 refills | Status: DC

## 2017-04-11 NOTE — Progress Notes (Signed)
Patient: Daniel [name], Male    DOB: 1950/09/16, 66 y.o.   MRN: [id number] Visit Date: 04/11/2017  Today's Provider: Wilhemena Durie, MD   Chief Complaint  Patient presents with  . Annual Exam   Subjective:   Daniel [name] is a 66 y.o. male who presents today for his Subsequent Annual Wellness Visit. He feels well. He reports exercising daily. He reports he is sleeping well.  Immunization History  Administered Date(s) Administered  . Influenza, High Dose Seasonal PF 02/25/2015, 02/15/2017  . Influenza, Seasonal, Injecte, Preservative Fre 06/26/2016  . Pneumococcal Conjugate-13 01/27/2015  . Tdap 01/23/2007  . Zoster 05/31/2010   11/18/2014 Colonoscopy-hyperplastic polyp, patient reported.  Review of Systems  Constitutional: Negative.   HENT: Negative.   Eyes: Negative.   Respiratory: Negative.   Cardiovascular: Negative.   Gastrointestinal: Positive for rectal pain (hemorrhoids).  Endocrine: Negative.   Genitourinary: Negative.   Musculoskeletal: Positive for arthralgias (foot).  Skin: Negative.   Allergic/Immunologic: Negative.   Neurological: Negative.   Hematological: Negative.   Psychiatric/Behavioral: Negative.     Patient Active Problem List   Diagnosis Date Noted  . BPH (benign prostatic hyperplasia) 11/21/2014  . Hypothyroidism 11/21/2014  . Obesity 11/21/2014  . Asthma 11/21/2014  . Inguinal hernia 11/21/2014  . OA (osteoarthritis) 11/21/2014  . H/O adenomatous polyp of colon 10/14/2014    Social History   Socioeconomic History  . Marital status: Widowed    Spouse name: Not on file  . Number of children: Not on file  . Years of education: Not on file  . Highest education level: Not on file  Social Needs  . Financial resource strain: Not on file  . Food insecurity - worry: Not on file  . Food insecurity - inability: Not on file  . Transportation needs - medical: Not on file  . Transportation needs - non-medical: Not on file  Occupational  History  . Not on file  Tobacco Use  . Smoking status: Former Research scientist (life sciences)  . Smokeless tobacco: Never Used  . Tobacco comment: quit smoking 25 years ago  Substance and Sexual Activity  . Alcohol use: Yes    Comment: 1-2 glasses of wine 4-5 times a week  . Drug use: No  . Sexual activity: Not on file  Other Topics Concern  . Not on file  Social History Narrative  . Not on file    Past Surgical History:  Procedure Laterality Date  . CATARACT EXTRACTION    . COLON SURGERY    . EYE SURGERY    . KNEE SURGERY    . NASAL SEPTUM SURGERY      His family history includes Arthritis in his brother; Cervical cancer in his mother; Colon cancer in his mother; Colon polyps in his mother; Heart attack in his brother; Hypertension in his father; Leukemia in his father; Osteoporosis in his mother; Throat cancer in his maternal grandmother.     Outpatient Encounter Medications as of 04/11/2017  Medication Sig Note  . finasteride (PROSCAR) 5 MG tablet Take 5 mg by mouth 2 (two) times daily.    . Ginkgo Biloba 40 MG TABS Take by mouth. 05/24/2016: Received from: [place]: Take by mouth once daily.  Marland Kitchen levothyroxine (SYNTHROID, LEVOTHROID) 100 MCG tablet take 1 tablet by mouth once daily BEFORE BREAKFAST   . Multiple Vitamin (MULTIVITAMIN) capsule Take 1 capsule daily by mouth.   . Omega-3 Fatty Acids (FISH OIL) 1200 MG CPDR Take by mouth.   Marland Kitchen  omeprazole (PRILOSEC OTC) 20 MG tablet Take 20 mg by mouth daily.   . [DISCONTINUED] doxycycline (VIBRA-TABS) 100 MG tablet Take 1 tablet (100 mg total) by mouth 2 (two) times daily.    Facility-Administered Encounter Medications as of 04/11/2017  Medication  . ranitidine (ZANTAC) tablet 150 mg    Allergies  Allergen Reactions  . Ciprofloxacin Hcl Other (See Comments)    Blisters break out  . Levaquin [Levofloxacin In D5w]   . Sulfa Antibiotics Other (See Comments)    unknown    Patient Care Team: Jerrol Banana., MD as PCP - General (Family Medicine)   Objective:   Vitals:  Vitals:   04/11/17 0956  BP: 120/82  Pulse: 68  Resp: 16  Temp: 98.6 F (37 C)  TempSrc: Oral  Weight: 227 lb (103 kg)    Physical Exam  Constitutional: He appears well-developed and well-nourished.  HENT:  Head: Normocephalic and atraumatic.  Right Ear: External ear normal.  Left Ear: External ear normal.  Nose: Nose normal.  Mouth/Throat: Oropharynx is clear and moist.  Eyes: Conjunctivae and EOM are normal. Pupils are equal, round, and reactive to light.  Neck: Normal range of motion. Neck supple.  Cardiovascular: Normal rate, regular rhythm, normal heart sounds and intact distal pulses.  Pulmonary/Chest: Effort normal and breath sounds normal.  Abdominal: Soft. Bowel sounds are normal.  Genitourinary: Penis normal.  Musculoskeletal: Normal range of motion.  Neurological: He is alert.  Skin: Skin is warm and dry.  Psychiatric: He has a normal mood and affect. His behavior is normal. Judgment and thought content normal.    Activities of Daily Living In your present state of health, do you have any difficulty performing the following activities: 04/11/2017  Hearing? Y  Vision? N  Difficulty concentrating or making decisions? N  Walking or climbing stairs? N  Dressing or bathing? N  Doing errands, shopping? N  Some recent data might be hidden    Fall Risk Assessment Fall Risk  04/11/2017 04/06/2016 01/27/2015  Falls in the past year? No No No   Home Exercise  04/11/2017 04/06/2016  Current Exercise Habits Home exercise routine Home exercise routine  Type of exercise - walking  Time (Minutes) - 40  Frequency (Times/Week) 5 6  Weekly Exercise (Minutes/Week) - 240     Depression Screen PHQ 2/9 Scores 04/11/2017 04/06/2016 01/27/2015  PHQ - 2 Score 0 0 0  PHQ- 9 Score 0 - -    Cognitive Testing - 6-CIT    Year: 0  points  Month: 0  points  Memorize "Pia Mau, [address] [place]"  Time  (within 1 hour:) 0  points  Count backwards from 20: 0 points  Name months of year: 0  points  Repeat Address: 2  points   Total Score: 2/28  Interpretation : Normal (0-7) Abnormal (8-28)    Assessment & Plan:     Annual Wellness Visit  Reviewed patient's Family Medical History Reviewed and updated list of patient's medical providers Assessment of cognitive impairment was done Assessed patient's functional ability Established a written schedule for health screening [place] Completed and Reviewed  Exercise Activities and Dietary recommendations Goals    None      Immunization History  Administered Date(s) Administered  . Influenza, High Dose Seasonal PF 02/25/2015, 02/15/2017  . Influenza, Seasonal, Injecte, Preservative Fre 06/26/2016  . Pneumococcal Conjugate-13 01/27/2015  . Tdap 01/23/2007  . Zoster 05/31/2010    Health Maintenance  Topic Date Due  . PNA vac Low Risk Adult (2 of 2 - PPSV23) 01/27/2016  . TETANUS/TDAP  01/22/2017  . COLONOSCOPY  05/31/2021  . INFLUENZA VACCINE  Completed  . Hepatitis C Screening  Completed    Colonscopy 2016. Discussed health benefits of physical activity, and encouraged him to engage in regular exercise appropriate for his age and condition.   I have done the exam and reviewed the chart and it is accurate to the best of my knowledge. Development worker, community has been used and  any errors in dictation or transcription are unintentional. Miguel Aschoff M.D. [facility]

## 2017-04-12 DIAGNOSIS — K625 Hemorrhage of anus and rectum: Secondary | ICD-10-CM | POA: Diagnosis not present

## 2017-04-12 DIAGNOSIS — E038 Other specified hypothyroidism: Secondary | ICD-10-CM | POA: Diagnosis not present

## 2017-04-12 DIAGNOSIS — E785 Hyperlipidemia, unspecified: Secondary | ICD-10-CM | POA: Diagnosis not present

## 2017-04-12 LAB — COMPLETE METABOLIC PANEL WITH GFR
AG RATIO: 1.7 (calc) (ref 1.0–2.5)
ALT: 15 U/L (ref 9–46)
AST: 16 U/L (ref 10–35)
Albumin: 4 g/dL (ref 3.6–5.1)
Alkaline phosphatase (APISO): 73 U/L (ref 40–115)
BUN: 19 mg/dL (ref 7–25)
CALCIUM: 9.2 mg/dL (ref 8.6–10.3)
CO2: 27 mmol/L (ref 20–32)
CREATININE: 0.96 mg/dL (ref 0.70–1.25)
Chloride: 107 mmol/L (ref 98–110)
GFR, EST AFRICAN AMERICAN: 94 mL/min/{1.73_m2} (ref >=60)
GFR, EST NON AFRICAN AMERICAN: 81 mL/min/{1.73_m2} (ref >=60)
GLUCOSE: 97 mg/dL (ref 65–99)
Globulin: 2.4 g/dL (calc) (ref 1.9–3.7)
Potassium: 4.3 mmol/L (ref 3.5–5.3)
Sodium: 141 mmol/L (ref 135–146)
TOTAL PROTEIN: 6.4 g/dL (ref 6.1–8.1)
Total Bilirubin: 0.5 mg/dL (ref 0.2–1.2)

## 2017-04-12 LAB — CBC WITH DIFFERENTIAL/PLATELET
BASOS ABS: 88 {cells}/uL (ref 0–200)
BASOS PCT: 1.3 %
EOS ABS: 347 {cells}/uL (ref 15–500)
Eosinophils Relative: 5.1 %
HCT: 45.6 % (ref 38.5–50.0)
Hemoglobin: 16.1 g/dL (ref 13.2–17.1)
Lymphs Abs: 1088 cells/uL (ref 850–3900)
MCH: 30.3 pg (ref 27.0–33.0)
MCHC: 35.3 g/dL (ref 32.0–36.0)
MCV: 85.7 fL (ref 80.0–100.0)
MONOS PCT: 9.1 %
MPV: 10.2 fL (ref 7.5–12.5)
NEUTROS PCT: 68.5 %
Neutro Abs: 4658 cells/uL (ref 1500–7800)
PLATELETS: 214 10*3/uL (ref 140–400)
RBC: 5.32 10*6/uL (ref 4.20–5.80)
RDW: 13.3 % (ref 11.0–15.0)
TOTAL LYMPHOCYTE: 16 %
WBC: 6.8 10*3/uL (ref 3.8–10.8)
WBCMIX: 619 {cells}/uL (ref 200–950)

## 2017-04-12 LAB — TSH: TSH: 3.54 m[IU]/L (ref 0.40–4.50)

## 2017-04-12 LAB — LIPID PANEL
Cholesterol: 195 mg/dL (ref <200)
HDL: 65 mg/dL (ref >40)
LDL Cholesterol (Calc): 113 mg/dL (calc) — ABNORMAL HIGH
NON-HDL CHOLESTEROL (CALC): 130 mg/dL — AB (ref <130)
TRIGLYCERIDES: 73 mg/dL (ref <150)
Total CHOL/HDL Ratio: 3 (calc) (ref <5.0)

## 2017-07-06 ENCOUNTER — Other Ambulatory Visit: Payer: Self-pay | Admitting: Family Medicine

## 2017-07-06 MED ORDER — LEVOTHYROXINE SODIUM 100 MCG PO TABS: ORAL_TABLET | ORAL | 2 refills | Status: DC

## 2017-07-06 NOTE — Telephone Encounter (Signed)
Pt contacted office for refill request on the following medications:  levothyroxine (SYNTHROID, LEVOTHROID) 100 MCG tablet  [employer].   Last Rx: 03/16/17 LOV: 04/11/17 Please advise. Thanks TNP

## 2017-07-17 ENCOUNTER — Ambulatory Visit: Payer: Medicare Other | Admitting: Family Medicine

## 2017-07-17 ENCOUNTER — Encounter: Payer: Self-pay | Admitting: Family Medicine

## 2017-07-17 VITALS — BP 120/76 | HR 70 | Temp 97.8°F | Resp 18 | Ht 73.0 in | Wt 232.0 lb

## 2017-07-17 DIAGNOSIS — J42 Unspecified chronic bronchitis: Secondary | ICD-10-CM | POA: Diagnosis not present

## 2017-07-17 MED ORDER — DOXYCYCLINE HYCLATE 100 MG PO TABS: 100.0000 mg | ORAL_TABLET | Freq: Two times a day (BID) | ORAL | 0 refills | Status: DC

## 2017-07-17 NOTE — Progress Notes (Signed)
Patient: Daniel [name] Male    DOB: 13-Sep-1950   67 y.o.   MRN: [id number] Visit Date: 07/17/2017  Today's Provider: Wilhemena Durie, MD   Chief Complaint  Patient presents with  . URI   Subjective:    HPI Pt reports that he started feeling bad 5 days ago. He had cough, sneezing, Headache, nausea, sore throat, yellow/green sputum, and body aches. Denies fever, chills, shortness of breath. No sinus pain or pressure. He has been taking otc cold meds for his symptoms.  Symptoms worsened yesterday.      Allergies  Allergen Reactions  . Ciprofloxacin Hcl Other (See Comments)    Blisters break out  . Levaquin [Levofloxacin In D5w]   . Sulfa Antibiotics Other (See Comments)    unknown     Current Outpatient Medications:  .  finasteride (PROSCAR) 5 MG tablet, Take 5 mg by mouth 2 (two) times daily. , Disp: , Rfl:  .  Ginkgo Biloba 40 MG TABS, Take by mouth., Disp: , Rfl:  .  levothyroxine (SYNTHROID, LEVOTHROID) 100 MCG tablet, take 1 tablet by mouth once daily BEFORE BREAKFAST, Disp: 30 tablet, Rfl: 2 .  Multiple Vitamin (MULTIVITAMIN) capsule, Take 1 capsule daily by mouth., Disp: , Rfl:  .  Omega-3 Fatty Acids (FISH OIL) 1200 MG CPDR, Take by mouth., Disp: , Rfl:  .  omeprazole (PRILOSEC OTC) 20 MG tablet, Take 20 mg by mouth daily., Disp: , Rfl:  .  hydrocortisone (ANUSOL-HC) 25 MG suppository, Place 1 suppository (25 mg total) 2 (two) times daily rectally. (Patient not taking: Reported on 07/17/2017), Disp: 12 suppository, Rfl: 0  Current Facility-Administered Medications:  .  ranitidine (ZANTAC) tablet 150 mg, 150 mg, Oral, BID, Jerrol Banana., MD  Review of Systems  Constitutional: Positive for fatigue.  HENT: Positive for congestion, ear pain, postnasal drip, rhinorrhea, sneezing and sore throat.   Eyes: Negative.   Respiratory: Positive for cough.   Cardiovascular: Negative.   Gastrointestinal: Positive for nausea.  Endocrine: Negative.     Genitourinary: Negative.   Musculoskeletal: Positive for myalgias.  Skin: Negative.   Allergic/Immunologic: Negative.   Neurological: Positive for headaches.  Hematological: Negative.   Psychiatric/Behavioral: Negative.     Social History   Tobacco Use  . Smoking status: Former Research scientist (life sciences)  . Smokeless tobacco: Never Used  . Tobacco comment: quit smoking 25 years ago  Substance Use Topics  . Alcohol use: Yes    Comment: 1-2 glasses of wine 4-5 times a week   Objective:   BP 120/76 (BP Location: Left Arm, Patient Position: Sitting, Cuff Size: Normal)   Pulse 70   Temp 97.8 F (36.6 C) (Oral)   Resp 18   Ht 6\' 1"  (1.854 m)   Wt 232 lb (105.2 kg)   SpO2 96%   BMI 30.61 kg/m  Vitals:   07/17/17 0935  BP: 120/76  Pulse: 70  Resp: 18  Temp: 97.8 F (36.6 C)  TempSrc: Oral  SpO2: 96%  Weight: 232 lb (105.2 kg)  Height: 6\' 1"  (1.854 m)     Physical Exam  Constitutional: He is oriented to person, place, and time. He appears well-developed and well-nourished.  HENT:  Head: Normocephalic and atraumatic.  Right Ear: External ear normal.  Left Ear: External ear normal.  Nose: Nose normal.  Mouth/Throat: Oropharynx is clear and moist.  Eyes: Conjunctivae and EOM are normal. Pupils are equal, round, and reactive to light.  Neck: Normal range  of motion. Neck supple.  Cardiovascular: Normal rate, regular rhythm, normal heart sounds and intact distal pulses.  Pulmonary/Chest: Effort normal and breath sounds normal.  Musculoskeletal: Normal range of motion.  Neurological: He is alert and oriented to person, place, and time. He has normal reflexes.  Skin: Skin is warm and dry.  Psychiatric: He has a normal mood and affect. His behavior is normal. Judgment and thought content normal.        Assessment & Plan:     1. Chronic bronchitis, unspecified chronic bronchitis type (HCC)  - doxycycline (VIBRA-TABS) 100 MG tablet; Take 1 tablet (100 mg total) by mouth 2 (two) times  daily.  Dispense: 20 tablet; Refill: 0 2.Cough     HPI, Exam, and A&P Transcribed under the direction and in the presence of Richard L. Cranford Mon, MD  Electronically Signed: Katina Dung, CMA  I have done the exam and reviewed the above chart and it is accurate to the best of my knowledge. Development worker, community has been used in this note in any air is in the dictation or transcription are unintentional.  Wilhemena Durie, MD  [place]

## 2017-07-17 NOTE — Patient Instructions (Signed)
Rest, fluids and Robitussin.

## 2017-08-24 ENCOUNTER — Ambulatory Visit: Payer: Medicare Other | Admitting: Family Medicine

## 2017-08-24 ENCOUNTER — Other Ambulatory Visit: Payer: Self-pay | Admitting: Family Medicine

## 2017-08-24 ENCOUNTER — Encounter: Payer: Self-pay | Admitting: Family Medicine

## 2017-08-24 VITALS — BP 118/78 | HR 60 | Temp 97.6°F | Resp 16 | Wt 227.0 lb

## 2017-08-24 DIAGNOSIS — J069 Acute upper respiratory infection, unspecified: Secondary | ICD-10-CM | POA: Diagnosis not present

## 2017-08-24 NOTE — Patient Instructions (Signed)
May add Mucinex D for congestion, Delsym for cough. May add a steroid nasal spray like Flonase if your ears do not start to clear.

## 2017-08-24 NOTE — Progress Notes (Signed)
Subjective:     Patient ID: Daniel [name], male   DOB: [birth year], 67 y.o.   MRN: [id number] Chief Complaint  Patient presents with  . URI    x 4 days   HPI Primarily concerned about his ears being stopped up due to prior hearing loss requiring hearing aids.  Review of Systems     Objective:   Physical Exam  Constitutional: He appears well-developed and well-nourished. No distress.  Ears: T.M's intact without inflammation Sinuses: non-tender Throat: no tonsillar enlargement or exudate. Mild posterior pharyngeal erythema. Neck: no cervical adenopathy Lungs: clear     Assessment:    1. Viral upper respiratory tract infection     Plan:    Discussed otc medication and addition of Flonase if his ears do not clear. Also natural history of his illness.

## 2017-08-29 ENCOUNTER — Other Ambulatory Visit: Payer: Self-pay | Admitting: Family Medicine

## 2017-09-27 DIAGNOSIS — L739 Follicular disorder, unspecified: Secondary | ICD-10-CM | POA: Diagnosis not present

## 2017-09-27 DIAGNOSIS — L820 Inflamed seborrheic keratosis: Secondary | ICD-10-CM | POA: Diagnosis not present

## 2017-09-27 DIAGNOSIS — L821 Other seborrheic keratosis: Secondary | ICD-10-CM | POA: Diagnosis not present

## 2017-10-08 DIAGNOSIS — Z800 Family history of malignant neoplasm of digestive organs: Secondary | ICD-10-CM | POA: Diagnosis not present

## 2017-10-08 DIAGNOSIS — K6289 Other specified diseases of anus and rectum: Secondary | ICD-10-CM | POA: Diagnosis not present

## 2017-10-08 DIAGNOSIS — K625 Hemorrhage of anus and rectum: Secondary | ICD-10-CM | POA: Diagnosis not present

## 2017-10-08 DIAGNOSIS — Z86010 Personal history of colonic polyps: Secondary | ICD-10-CM | POA: Diagnosis not present

## 2017-10-09 ENCOUNTER — Ambulatory Visit: Payer: Self-pay | Admitting: Family Medicine

## 2017-10-09 DIAGNOSIS — K760 Fatty (change of) liver, not elsewhere classified: Secondary | ICD-10-CM | POA: Insufficient documentation

## 2017-10-15 ENCOUNTER — Ambulatory Visit: Payer: Medicare Other | Admitting: Family Medicine

## 2017-10-15 ENCOUNTER — Encounter: Payer: Self-pay | Admitting: Family Medicine

## 2017-10-15 VITALS — BP 124/80 | HR 65 | Temp 98.9°F | Resp 16 | Ht 73.0 in | Wt 221.0 lb

## 2017-10-15 DIAGNOSIS — R1032 Left lower quadrant pain: Secondary | ICD-10-CM

## 2017-10-15 MED ORDER — AMOXICILLIN-POT CLAVULANATE ER 1000-62.5 MG PO TB12: 2.0000 | ORAL_TABLET | Freq: Two times a day (BID) | ORAL | 0 refills | Status: DC

## 2017-10-15 NOTE — Patient Instructions (Signed)
Please let your G.I. Doctors know we are treating you for diverticulitis as they may wish to change the colonoscopy date.

## 2017-10-15 NOTE — Progress Notes (Signed)
  Subjective:     Patient ID: Daniel [name], male   DOB: 04-16-1951, 67 y.o.   MRN: [id number] Chief Complaint  Patient presents with  . Diverticulitis    inguinal area, LLQ   . Abdominal Pain    tightness, sharp pain,    HPI States he has noticed intermittent LLQ pain/discomfort since 5/15. He is concerned that it is diverticulitis. He has had partial colectomy in the past for a significant flare. Reports stools looser than usual ( last moved yesterday) with low grade temp on a couple of occasions. No dysuria with no change in his baseline nocturia due to BPH. He has a f/u colonoscopy scheduled for next month.  Review of Systems     Objective:   Physical Exam  Constitutional: He appears well-developed and well-nourished. No distress.  Abdominal: Bowel sounds are normal.  Mild tenderness in the LLQ without guarding.       Assessment:    1. Left lower quadrant pain: c/w mild diverticulitis Will rx with Augmentin due to prior allergy hx.    Plan:    Patient to inform G.i. About current treatment plan.

## 2017-11-08 DIAGNOSIS — D229 Melanocytic nevi, unspecified: Secondary | ICD-10-CM | POA: Diagnosis not present

## 2017-11-08 DIAGNOSIS — L820 Inflamed seborrheic keratosis: Secondary | ICD-10-CM | POA: Diagnosis not present

## 2017-11-08 DIAGNOSIS — D1801 Hemangioma of skin and subcutaneous tissue: Secondary | ICD-10-CM | POA: Diagnosis not present

## 2017-11-08 DIAGNOSIS — L300 Nummular dermatitis: Secondary | ICD-10-CM | POA: Diagnosis not present

## 2017-11-10 IMAGING — US US LIVER ELASTOGRAPHY
1 series · 12 of 12 positions shown · non-contrast
Comparison: Ultrasound 04/03/2016.

CLINICAL DATA: Abdominal pain. Recent ultrasound demonstrating
echogenic liver suspicious for steatosis.

EXAM:
ULTRASOUND HEPATIC ELASTOGRAPHY
TECHNIQUE: Ultrasound elastography evaluation of the liver was performed. A
region of interest was placed in the right lobe of the liver.
Following application of a compressive sonographic pulse, shear
waves were detected in the adjacent hepatic tissue and the shear
wave velocity was calculated. Multiple assessments were performed at
the selected site. Median shear wave velocity is correlated to a
Metavir fibrosis score.

[Series 1: us liver elastography · 0.24mm/px · 12 of 12 slices shown]
[im 1/12]
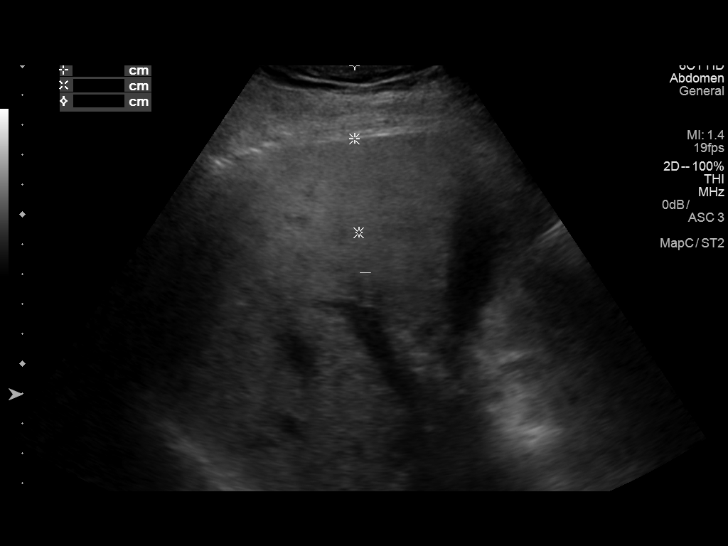
[im 2/12]
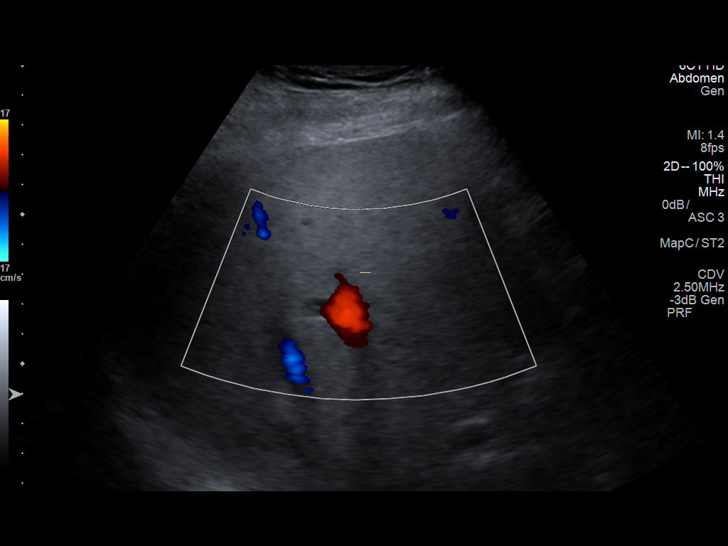
[im 3/12]
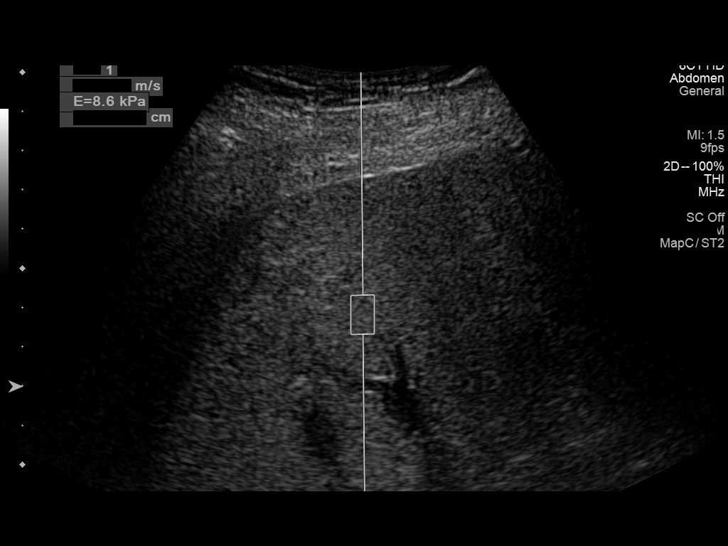
[im 4/12]
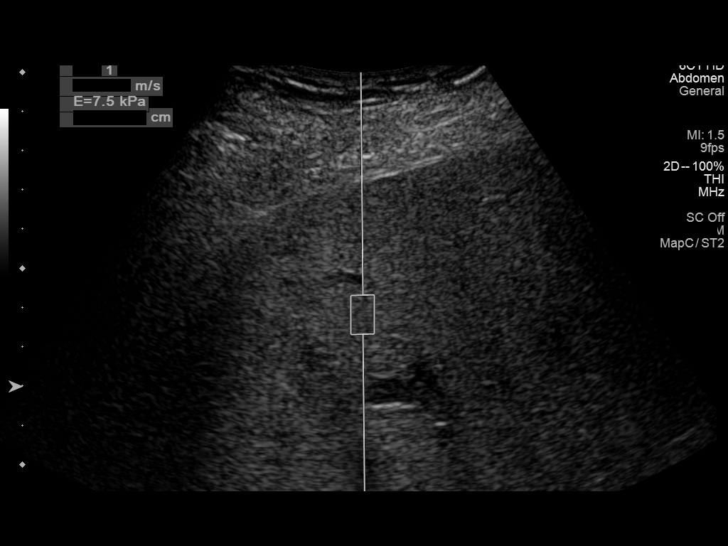
[im 5/12]
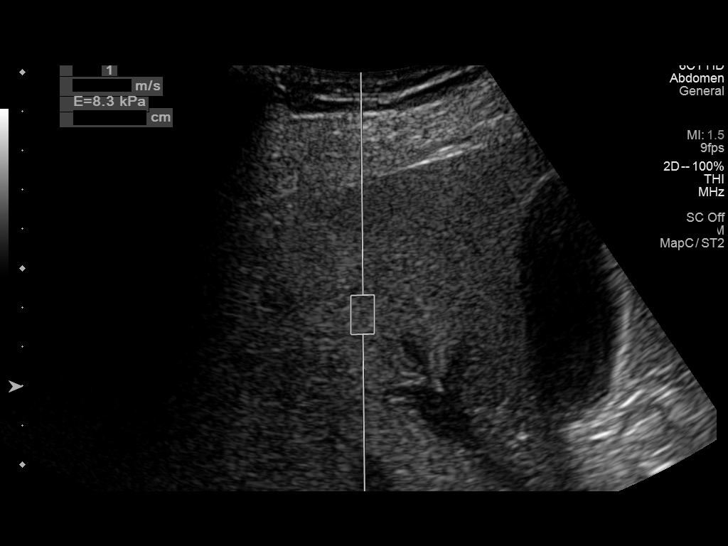
[im 6/12]
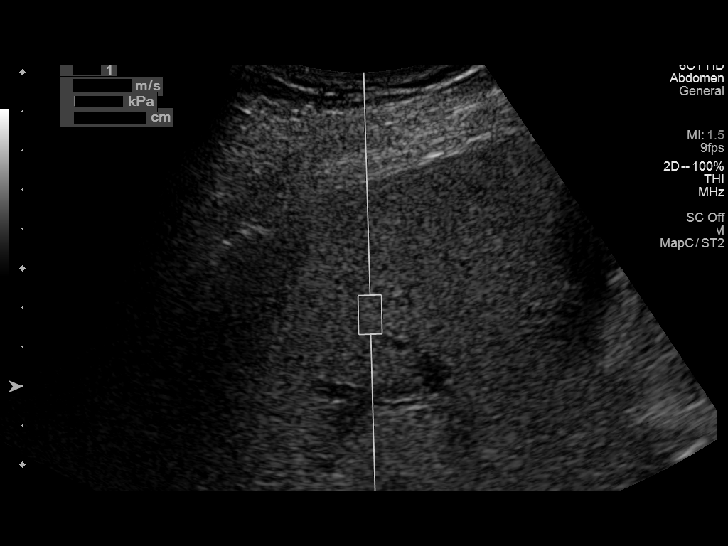
[im 7/12]
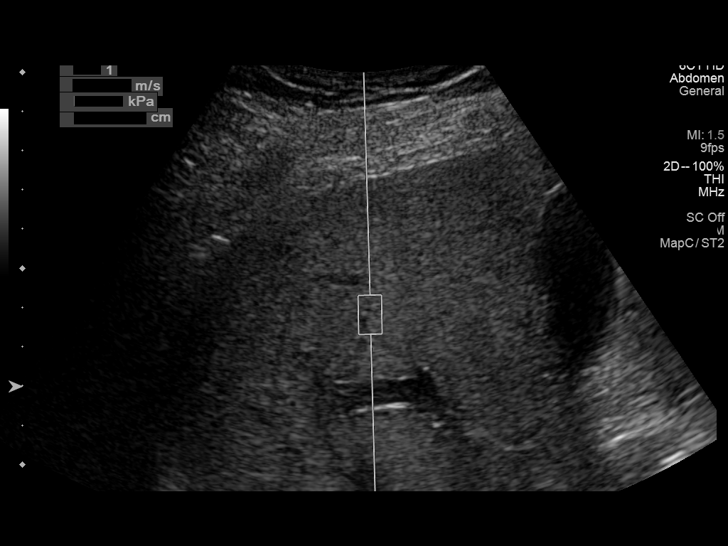
[im 8/12]
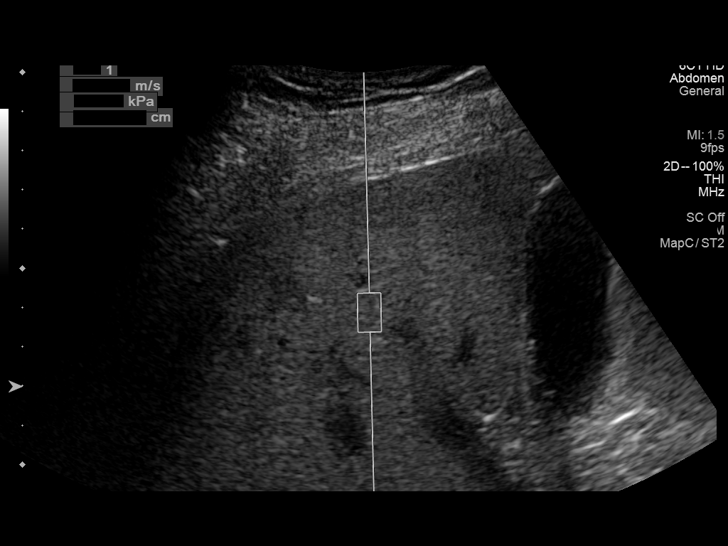
[im 9/12]
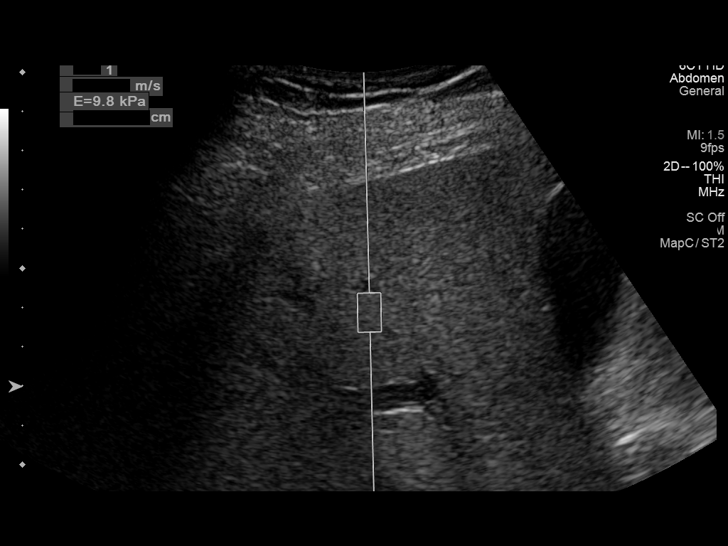
[im 10/12]
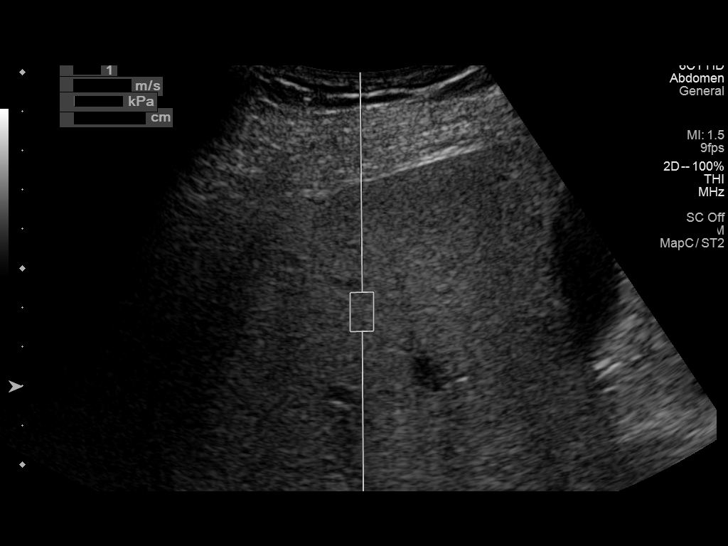
[im 11/12]
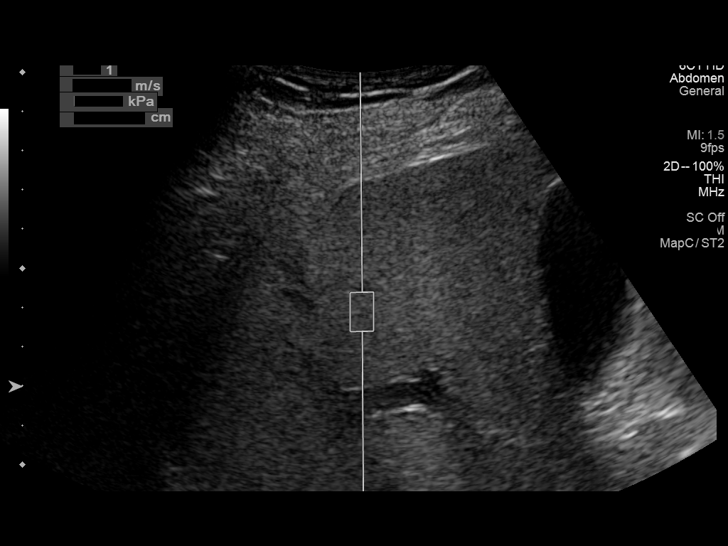
[im 12/12]
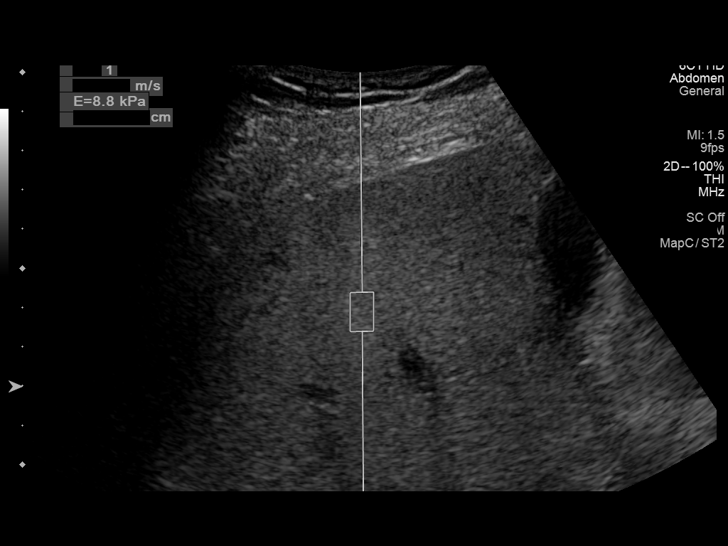

[12 of 12 positions shown; findings below may reference images not displayed]

FINDINGS: Device: Siemens Helix VTQ

Transducer 6C1

Patient position: Supine

Number of measurements:  10

Hepatic Segment:  8

Median velocity:   1.84  m/sec

IQR:

IQR/Median velocity ratio

Corresponding Metavir fibrosis score:  F2 + some F3

Risk of fibrosis: Moderate

Limitations of exam: None

Pertinent findings noted on other imaging exams:  None

Please note that abnormal shear wave velocities may also be
identified in clinical settings other than with hepatic fibrosis,
such as: acute hepatitis, elevated right heart and central venous
pressures including use of beta blockers, Wczasy disease
(Steck), infiltrative processes such as
mastocytosis/amyloidosis/infiltrative tumor, extrahepatic
cholestasis, in the post-prandial state, and liver transplantation.
Correlation with patient history, laboratory data, and clinical
condition recommended.
IMPRESSION: Median hepatic shear wave velocity is calculated at 1.84 m/sec.

Corresponding Metavir fibrosis score is  F2 + some F3.

Risk of fibrosis is moderate.

Follow-up: Additional testing appropriate.

## 2017-11-19 DIAGNOSIS — K640 First degree hemorrhoids: Secondary | ICD-10-CM | POA: Diagnosis not present

## 2017-11-19 DIAGNOSIS — Z800 Family history of malignant neoplasm of digestive organs: Secondary | ICD-10-CM | POA: Diagnosis not present

## 2017-11-19 DIAGNOSIS — Z86010 Personal history of colonic polyps: Secondary | ICD-10-CM | POA: Diagnosis not present

## 2017-11-19 DIAGNOSIS — K648 Other hemorrhoids: Secondary | ICD-10-CM | POA: Diagnosis not present

## 2017-11-19 DIAGNOSIS — K5730 Diverticulosis of large intestine without perforation or abscess without bleeding: Secondary | ICD-10-CM | POA: Diagnosis not present

## 2017-11-19 DIAGNOSIS — Z8371 Family history of colonic polyps: Secondary | ICD-10-CM | POA: Diagnosis not present

## 2017-11-19 LAB — HM COLONOSCOPY

## 2017-12-10 DIAGNOSIS — Z86010 Personal history of colonic polyps: Secondary | ICD-10-CM | POA: Diagnosis not present

## 2017-12-10 DIAGNOSIS — K760 Fatty (change of) liver, not elsewhere classified: Secondary | ICD-10-CM | POA: Diagnosis not present

## 2017-12-10 DIAGNOSIS — K740 Hepatic fibrosis: Secondary | ICD-10-CM | POA: Diagnosis not present

## 2017-12-13 ENCOUNTER — Ambulatory Visit: Payer: Medicare Other | Admitting: Family Medicine

## 2017-12-13 ENCOUNTER — Other Ambulatory Visit: Payer: Self-pay | Admitting: Nurse Practitioner

## 2017-12-13 VITALS — BP 102/70 | HR 72 | Temp 98.3°F | Resp 16 | Wt 220.0 lb

## 2017-12-13 DIAGNOSIS — N400 Enlarged prostate without lower urinary tract symptoms: Secondary | ICD-10-CM

## 2017-12-13 DIAGNOSIS — R197 Diarrhea, unspecified: Secondary | ICD-10-CM

## 2017-12-13 DIAGNOSIS — E038 Other specified hypothyroidism: Secondary | ICD-10-CM

## 2017-12-13 DIAGNOSIS — K760 Fatty (change of) liver, not elsewhere classified: Secondary | ICD-10-CM

## 2017-12-13 NOTE — Progress Notes (Signed)
Daniel [name]  MRN: [id number] DOB: 10-Sep-1950  Subjective:  HPI  The patient is a 67 year old male who presents for evaluation of diarrhea.  He states he has had some diarrhea for the last several weeks.  This weak he had 2 days of explosive diarrhea, then a day of watery diarrhea and then yesterday and today it has been a little better.  He voices concerns about the possibility of pancreatitis.  He alos states that he had a colonoscopy last month and it revealed diverticula.  Patient Active Problem List   Diagnosis Date Noted  . BPH (benign prostatic hyperplasia) 11/21/2014  . Hypothyroidism 11/21/2014  . Obesity 11/21/2014  . Asthma 11/21/2014  . Inguinal hernia 11/21/2014  . OA (osteoarthritis) 11/21/2014  . H/O adenomatous polyp of colon 10/14/2014    Past Medical History:  Diagnosis Date  . Asthma   . Hypothyroidism   . Seasonal allergies   . Thyroid disease     Social History   Socioeconomic History  . Marital status: Widowed    Spouse name: Not on file  . Number of children: Not on file  . Years of education: Not on file  . Highest education level: Not on file  Occupational History  . Not on file  Social Needs  . Financial resource strain: Not on file  . Food insecurity:    Worry: Not on file    Inability: Not on file  . Transportation needs:    Medical: Not on file    Non-medical: Not on file  Tobacco Use  . Smoking status: Former Research scientist (life sciences)  . Smokeless tobacco: Never Used  . Tobacco comment: quit smoking 25 years ago  Substance and Sexual Activity  . Alcohol use: Yes    Comment: 1-2 glasses of wine 4-5 times a week  . Drug use: No  . Sexual activity: Not on file  Lifestyle  . Physical activity:    Days per week: Not on file    Minutes per session: Not on file  . Stress: Not on file  Relationships  . Social connections:    Talks on phone: Not on file    Gets together: Not on file    Attends religious service: Not on file    Active member of  club or organization: Not on file    Attends meetings of clubs or organizations: Not on file    Relationship status: Not on file  . Intimate partner violence:    Fear of current or ex partner: Not on file    Emotionally abused: Not on file    Physically abused: Not on file    Forced sexual activity: Not on file  Other Topics Concern  . Not on file  Social History Narrative  . Not on file    Outpatient Encounter Medications as of 12/13/2017  Medication Sig Note  . finasteride (PROSCAR) 5 MG tablet Take 5 mg by mouth 2 (two) times daily.    . Ginkgo Biloba 40 MG TABS Take by mouth. 05/24/2016: Received from: [place] [place]: Take by mouth once daily.  Marland Kitchen levothyroxine (SYNTHROID, LEVOTHROID) 100 MCG tablet TAKE 1 TABLET BY MOUTH EVERY DAY BEFORE BREAKFAST   . Multiple Vitamin (MULTIVITAMIN) capsule Take 1 capsule daily by mouth.   . Omega-3 Fatty Acids (FISH OIL) 1200 MG CPDR Take by mouth.   Marland Kitchen omeprazole (PRILOSEC OTC) 20 MG tablet Take 20 mg by mouth daily.   . [DISCONTINUED] amoxicillin-clavulanate (AUGMENTIN  XR) 1000-62.5 MG 12 hr tablet Take 2 tablets by mouth 2 (two) times daily.    Facility-Administered Encounter Medications as of 12/13/2017  Medication  . ranitidine (ZANTAC) tablet 150 mg    Allergies  Allergen Reactions  . Ciprofloxacin Hcl Other (See Comments)    Blisters break out  . Levaquin [Levofloxacin In D5w]   . Sulfa Antibiotics Other (See Comments)    unknown    Review of Systems  Constitutional: Negative for fever and malaise/fatigue.  Respiratory: Negative for cough, shortness of breath and wheezing.   Cardiovascular: Negative for chest pain, palpitations, orthopnea, claudication and leg swelling.  Gastrointestinal: Positive for abdominal pain (LLQ), diarrhea and heartburn. Negative for blood in stool, constipation, melena, nausea and vomiting.  Neurological: Negative.   Endo/Heme/Allergies: Negative.   Psychiatric/Behavioral:  Negative.     Objective:  BP 102/70 (BP Location: Right Arm, Patient Position: Sitting, Cuff Size: Normal)   Pulse 72   Temp 98.3 F (36.8 C) (Oral)   Resp 16   Wt 220 lb (99.8 kg)   SpO2 97%   BMI 29.03 kg/m   Physical Exam  Constitutional: He is oriented to person, place, and time and well-developed, well-nourished, and in no distress.  HENT:  Head: Normocephalic and atraumatic.  Right Ear: External ear normal.  Left Ear: External ear normal.  Nose: Nose normal.  Eyes: Conjunctivae are normal. No scleral icterus.  Neck: No thyromegaly present.  Cardiovascular: Normal rate, regular rhythm and normal heart sounds.  Pulmonary/Chest: Effort normal and breath sounds normal.  Abdominal: Soft.  Musculoskeletal: He exhibits no edema.  Neurological: He is alert and oriented to person, place, and time. Gait normal. GCS score is 15.  Skin: Skin is warm and dry.  Psychiatric: Mood, memory, affect and judgment normal.    Assessment and Plan :  1. Diarrhea, unspecified type Pt has GI workup soon. - CBC with Differential/Platelet - Lipase 2.BPH  I have done the exam and reviewed the chart and it is accurate to the best of my knowledge. Development worker, community has been used and  any errors in dictation or transcription are unintentional. Miguel Aschoff M.D. [facility]

## 2017-12-14 ENCOUNTER — Telehealth: Payer: Self-pay

## 2017-12-14 DIAGNOSIS — K740 Hepatic fibrosis: Secondary | ICD-10-CM | POA: Diagnosis not present

## 2017-12-14 DIAGNOSIS — K760 Fatty (change of) liver, not elsewhere classified: Secondary | ICD-10-CM | POA: Diagnosis not present

## 2017-12-14 LAB — CBC WITH DIFFERENTIAL/PLATELET
BASOS ABS: 0.1 10*3/uL (ref 0.0–0.2)
Basos: 1 %
EOS (ABSOLUTE): 0.4 10*3/uL (ref 0.0–0.4)
EOS: 5 %
HEMATOCRIT: 45.5 % (ref 37.5–51.0)
Hemoglobin: 15.6 g/dL (ref 13.0–17.7)
IMMATURE GRANS (ABS): 0 10*3/uL (ref 0.0–0.1)
IMMATURE GRANULOCYTES: 0 %
LYMPHS ABS: 1.3 10*3/uL (ref 0.7–3.1)
LYMPHS: 19 %
MCH: 29.9 pg (ref 26.6–33.0)
MCHC: 34.3 g/dL (ref 31.5–35.7)
MCV: 87 fL (ref 79–97)
MONOCYTES: 10 %
Monocytes Absolute: 0.7 10*3/uL (ref 0.1–0.9)
Neutrophils Absolute: 4.6 10*3/uL (ref 1.4–7.0)
Neutrophils: 65 %
Platelets: 216 10*3/uL (ref 150–450)
RBC: 5.21 x10E6/uL (ref 4.14–5.80)
RDW: 13 % (ref 12.3–15.4)
WBC: 7 10*3/uL (ref 3.4–10.8)

## 2017-12-14 LAB — LIPASE: LIPASE: 20 U/L (ref 13–78)

## 2017-12-14 NOTE — Telephone Encounter (Signed)
-----   Message from Jerrol Banana., MD sent at 12/14/2017 11:02 AM EDT ----- Karren Cobble

## 2017-12-14 NOTE — Telephone Encounter (Signed)
Left message advising pt.  (Per DPR)  Thanks,   -Lyndon Chenoweth  

## 2017-12-20 ENCOUNTER — Ambulatory Visit
Admission: RE | Admit: 2017-12-20 | Discharge: 2017-12-20 | Disposition: A | Discharge status: Home / Self Care | Payer: Medicare Other | Source: Ambulatory Visit | Attending: Nurse Practitioner | Admitting: Nurse Practitioner

## 2017-12-20 DIAGNOSIS — N1330 Unspecified hydronephrosis: Secondary | ICD-10-CM | POA: Insufficient documentation

## 2017-12-20 DIAGNOSIS — K760 Fatty (change of) liver, not elsewhere classified: Secondary | ICD-10-CM | POA: Diagnosis not present

## 2017-12-31 DIAGNOSIS — N1339 Other hydronephrosis: Secondary | ICD-10-CM | POA: Diagnosis not present

## 2017-12-31 DIAGNOSIS — N401 Enlarged prostate with lower urinary tract symptoms: Secondary | ICD-10-CM | POA: Diagnosis not present

## 2017-12-31 DIAGNOSIS — Z125 Encounter for screening for malignant neoplasm of prostate: Secondary | ICD-10-CM | POA: Diagnosis not present

## 2018-01-01 DIAGNOSIS — N401 Enlarged prostate with lower urinary tract symptoms: Secondary | ICD-10-CM | POA: Diagnosis not present

## 2018-01-02 DIAGNOSIS — N401 Enlarged prostate with lower urinary tract symptoms: Secondary | ICD-10-CM | POA: Diagnosis not present

## 2018-01-02 DIAGNOSIS — R3914 Feeling of incomplete bladder emptying: Secondary | ICD-10-CM | POA: Diagnosis not present

## 2018-01-07 DIAGNOSIS — N401 Enlarged prostate with lower urinary tract symptoms: Secondary | ICD-10-CM | POA: Diagnosis not present

## 2018-01-07 DIAGNOSIS — N5201 Erectile dysfunction due to arterial insufficiency: Secondary | ICD-10-CM | POA: Diagnosis not present

## 2018-01-08 ENCOUNTER — Ambulatory Visit: Payer: Medicare Other | Admitting: Family Medicine

## 2018-01-08 VITALS — BP 112/78 | HR 74 | Temp 98.3°F | Resp 16 | Wt 217.0 lb

## 2018-01-08 DIAGNOSIS — J309 Allergic rhinitis, unspecified: Secondary | ICD-10-CM | POA: Diagnosis not present

## 2018-01-08 DIAGNOSIS — K219 Gastro-esophageal reflux disease without esophagitis: Secondary | ICD-10-CM

## 2018-01-08 MED ORDER — RANITIDINE HCL 150 MG PO TABS: 150.0000 mg | ORAL_TABLET | Freq: Two times a day (BID) | ORAL | 12 refills | Status: DC

## 2018-01-08 MED ORDER — CETIRIZINE HCL 10 MG PO TABS: 10.0000 mg | ORAL_TABLET | Freq: Every day | ORAL | 11 refills | Status: DC

## 2018-01-08 NOTE — Progress Notes (Signed)
Daniel [name]  MRN: [id number] DOB: 07-10-1950  Subjective:  HPI   The patient is a 67 year old male who presents for evaluation of throat discomfort.  He states that for the last few weeks he has felt as there was something stuck in his throat.  He has not noticed any pattern as the activity, eating habits or time of day.  However he has noticed with drinking cold liquids it will cause his throat to spasm. Solids do not seem to get stuck. No anorexia or weight loss.  Patient Active Problem List   Diagnosis Date Noted  . BPH (benign prostatic hyperplasia) 11/21/2014  . Hypothyroidism 11/21/2014  . Obesity 11/21/2014  . Asthma 11/21/2014  . Inguinal hernia 11/21/2014  . OA (osteoarthritis) 11/21/2014  . H/O adenomatous polyp of colon 10/14/2014    Past Medical History:  Diagnosis Date  . Asthma   . Hypothyroidism   . Seasonal allergies   . Thyroid disease     Social History   Socioeconomic History  . Marital status: Widowed    Spouse name: Not on file  . Number of children: Not on file  . Years of education: Not on file  . Highest education level: Not on file  Occupational History  . Not on file  Social Needs  . Financial resource strain: Not on file  . Food insecurity:    Worry: Not on file    Inability: Not on file  . Transportation needs:    Medical: Not on file    Non-medical: Not on file  Tobacco Use  . Smoking status: Former Research scientist (life sciences)  . Smokeless tobacco: Never Used  . Tobacco comment: quit smoking 25 years ago  Substance and Sexual Activity  . Alcohol use: Yes    Comment: 1-2 glasses of wine 4-5 times a week  . Drug use: No  . Sexual activity: Not on file  Lifestyle  . Physical activity:    Days per week: Not on file    Minutes per session: Not on file  . Stress: Not on file  Relationships  . Social connections:    Talks on phone: Not on file    Gets together: Not on file    Attends religious service: Not on file    Active member of club or  organization: Not on file    Attends meetings of clubs or organizations: Not on file    Relationship status: Not on file  . Intimate partner violence:    Fear of current or ex partner: Not on file    Emotionally abused: Not on file    Physically abused: Not on file    Forced sexual activity: Not on file  Other Topics Concern  . Not on file  Social History Narrative  . Not on file    Outpatient Encounter Medications as of 01/08/2018  Medication Sig Note  . finasteride (PROSCAR) 5 MG tablet Take 5 mg by mouth 2 (two) times daily.    . Ginkgo Biloba 40 MG TABS Take by mouth. 05/24/2016: Received from: [place]: Take by mouth once daily.  Marland Kitchen levothyroxine (SYNTHROID, LEVOTHROID) 100 MCG tablet TAKE 1 TABLET BY MOUTH EVERY DAY BEFORE BREAKFAST   . Multiple Vitamin (MULTIVITAMIN) capsule Take 1 capsule daily by mouth.   . Omega-3 Fatty Acids (FISH OIL) 1200 MG CPDR Take by mouth.   Marland Kitchen omeprazole (PRILOSEC OTC) 20 MG tablet Take 20 mg by mouth daily.    Facility-Administered  Encounter Medications as of 01/08/2018  Medication  . ranitidine (ZANTAC) tablet 150 mg    Allergies  Allergen Reactions  . Ciprofloxacin Hcl Other (See Comments)    Blisters break out  . Levaquin [Levofloxacin In D5w]   . Sulfa Antibiotics Other (See Comments)    unknown    Review of Systems  Constitutional: Negative.   HENT: Positive for congestion and sore throat. Negative for sinus pain.        Post nasal drainage of greeninsh yellow  Eyes: Negative.   Respiratory: Negative for cough, hemoptysis, shortness of breath and wheezing.   Cardiovascular: Negative for chest pain and palpitations.  Neurological: Negative.   Endo/Heme/Allergies: Negative.   Psychiatric/Behavioral: Negative.     Objective:  BP 112/78 (BP Location: Right Arm, Patient Position: Sitting, Cuff Size: Normal)   Pulse 74   Temp 98.3 F (36.8 C) (Oral)   Resp 16   Wt 217 lb (98.4 kg)   SpO2 99%    BMI 28.63 kg/m   Physical Exam  Constitutional: He is oriented to person, place, and time and well-developed, well-nourished, and in no distress.  HENT:  Head: Normocephalic and atraumatic.  Right Ear: External ear normal.  Left Ear: External ear normal.  Nose: Nose normal.  Mouth/Throat: Oropharynx is clear and moist.  Eyes: Conjunctivae are normal. No scleral icterus.  Neck: No thyromegaly present.  Cardiovascular: Normal rate, regular rhythm and normal heart sounds.  Pulmonary/Chest: Effort normal and breath sounds normal.  Abdominal: Soft.  Musculoskeletal: He exhibits no edema.  Lymphadenopathy:    He has no cervical adenopathy.  Neurological: He is alert and oriented to person, place, and time. Gait normal. GCS score is 15.  Skin: Skin is warm and dry.  Psychiatric: Mood, memory, affect and judgment normal.    Assessment and Plan :  1. Allergic rhinitis, unspecified seasonality, unspecified trigger  - cetirizine (ZYRTEC) 10 MG tablet; Take 1 tablet (10 mg total) by mouth daily.  Dispense: 30 tablet; Refill: 11  2. Gastroesophageal reflux disease, esophagitis presence not specified RTC 1 month. May need GI referral for pt peace of mind. - ranitidine (ZANTAC) 150 MG tablet; Take 1 tablet (150 mg total) by mouth 2 (two) times daily.  Dispense: 30 tablet; Refill: 12  I have done the exam and reviewed the chart and it is accurate to the best of my knowledge. Development worker, community has been used and  any errors in dictation or transcription are unintentional. Miguel Aschoff M.D. [facility]

## 2018-01-10 DIAGNOSIS — N401 Enlarged prostate with lower urinary tract symptoms: Secondary | ICD-10-CM | POA: Diagnosis not present

## 2018-01-22 DIAGNOSIS — N401 Enlarged prostate with lower urinary tract symptoms: Secondary | ICD-10-CM | POA: Diagnosis not present

## 2018-01-22 DIAGNOSIS — N5201 Erectile dysfunction due to arterial insufficiency: Secondary | ICD-10-CM | POA: Diagnosis not present

## 2018-02-06 DIAGNOSIS — L578 Other skin changes due to chronic exposure to nonionizing radiation: Secondary | ICD-10-CM | POA: Diagnosis not present

## 2018-02-06 DIAGNOSIS — L821 Other seborrheic keratosis: Secondary | ICD-10-CM | POA: Diagnosis not present

## 2018-02-06 DIAGNOSIS — D171 Benign lipomatous neoplasm of skin and subcutaneous tissue of trunk: Secondary | ICD-10-CM | POA: Diagnosis not present

## 2018-02-06 DIAGNOSIS — L820 Inflamed seborrheic keratosis: Secondary | ICD-10-CM | POA: Diagnosis not present

## 2018-02-13 ENCOUNTER — Ambulatory Visit: Payer: Medicare Other | Admitting: Family Medicine

## 2018-02-13 ENCOUNTER — Encounter: Payer: Self-pay | Admitting: Family Medicine

## 2018-02-13 VITALS — BP 108/68 | HR 70 | Temp 98.4°F | Resp 16 | Ht 73.5 in | Wt 217.0 lb

## 2018-02-13 DIAGNOSIS — Z23 Encounter for immunization: Secondary | ICD-10-CM | POA: Diagnosis not present

## 2018-02-13 DIAGNOSIS — J309 Allergic rhinitis, unspecified: Secondary | ICD-10-CM | POA: Diagnosis not present

## 2018-02-13 DIAGNOSIS — K219 Gastro-esophageal reflux disease without esophagitis: Secondary | ICD-10-CM

## 2018-02-13 MED ORDER — PANTOPRAZOLE SODIUM 40 MG PO TBEC: 40.0000 mg | DELAYED_RELEASE_TABLET | Freq: Two times a day (BID) | ORAL | 3 refills | Status: DC

## 2018-02-13 NOTE — Patient Instructions (Signed)
Discontinue Prilosec Start Pantoprazole 40 mg twice daily Continue Zantac at night Follow up in 2 months

## 2018-02-13 NOTE — Progress Notes (Signed)
Patient: Daniel [name] Male    DOB: 11-Sep-1950   67 y.o.   MRN: [id number] Visit Date: 02/13/2018  Today's Provider: Wilhemena Durie, MD   Chief Complaint  Patient presents with  . Follow-up   Subjective:    HPI Patient comes in today for a follow up. He was seen in the office 1 month ago c/o throat discomfort. He was started on ranitidine 150mg  BID. Patient reports that it has helped some. However, he does admit that he forgets to take the evening dose.     Allergies  Allergen Reactions  . Ciprofloxacin Hcl Other (See Comments)    Blisters break out  . Levaquin [Levofloxacin In D5w]   . Sulfa Antibiotics Other (See Comments)    unknown     Current Outpatient Medications:  .  cetirizine (ZYRTEC) 10 MG tablet, Take 1 tablet (10 mg total) by mouth daily., Disp: 30 tablet, Rfl: 11 .  Ginkgo Biloba 40 MG TABS, Take by mouth., Disp: , Rfl:  .  levothyroxine (SYNTHROID, LEVOTHROID) 100 MCG tablet, TAKE 1 TABLET BY MOUTH EVERY DAY BEFORE BREAKFAST, Disp: 90 tablet, Rfl: 3 .  Multiple Vitamin (MULTIVITAMIN) capsule, Take 1 capsule daily by mouth., Disp: , Rfl:  .  Omega-3 Fatty Acids (FISH OIL) 1200 MG CPDR, Take by mouth., Disp: , Rfl:  .  omeprazole (PRILOSEC OTC) 20 MG tablet, Take 20 mg by mouth daily., Disp: , Rfl:  .  ranitidine (ZANTAC) 150 MG tablet, Take 1 tablet (150 mg total) by mouth 2 (two) times daily., Disp: 30 tablet, Rfl: 12 .  silodosin (RAPAFLO) 4 MG CAPS capsule, Take 4 mg by mouth daily with breakfast., Disp: , Rfl:  .  finasteride (PROSCAR) 5 MG tablet, Take 5 mg by mouth 2 (two) times daily. , Disp: , Rfl:   Current Facility-Administered Medications:  .  ranitidine (ZANTAC) tablet 150 mg, 150 mg, Oral, BID, Jerrol Banana., MD  Review of Systems  Constitutional: Negative for activity change, appetite change, chills, diaphoresis, fatigue, fever and unexpected weight change.  Respiratory: Negative.   Cardiovascular: Negative for chest pain,  palpitations and leg swelling.  Gastrointestinal: Negative for abdominal distention, abdominal pain, anal bleeding, blood in stool, constipation, diarrhea, nausea, rectal pain and vomiting.  Musculoskeletal: Negative for arthralgias and joint swelling.  Allergic/Immunologic: Negative.   Neurological: Negative for dizziness and light-headedness.  Psychiatric/Behavioral: Negative.     Social History   Tobacco Use  . Smoking status: Former Research scientist (life sciences)  . Smokeless tobacco: Never Used  . Tobacco comment: quit smoking 25 years ago  Substance Use Topics  . Alcohol use: Yes    Comment: 1-2 glasses of wine 4-5 times a week   Objective:   BP 108/68 (BP Location: Left Arm, Patient Position: Sitting, Cuff Size: Normal)   Pulse 70   Temp 98.4 F (36.9 C)   Resp 16   Ht 6' 1.5" (1.867 m)   Wt 217 lb (98.4 kg)   SpO2 97%   BMI 28.24 kg/m  Vitals:   02/13/18 0827  BP: 108/68  Pulse: 70  Resp: 16  Temp: 98.4 F (36.9 C)  SpO2: 97%  Weight: 217 lb (98.4 kg)  Height: 6' 1.5" (1.867 m)     Physical Exam  Constitutional: He is oriented to person, place, and time. He appears well-developed and well-nourished.  HENT:  Head: Normocephalic and atraumatic.  Right Ear: External ear normal.  Left Ear: External ear normal.  Nose:  Nose normal.  Eyes: Conjunctivae are normal. No scleral icterus.  Neck: No thyromegaly present.  Cardiovascular: Normal rate, regular rhythm and normal heart sounds.  Pulmonary/Chest: Effort normal and breath sounds normal.  Abdominal: Soft.  Musculoskeletal: He exhibits no edema.  Lymphadenopathy:    He has no cervical adenopathy.  Neurological: He is alert and oriented to person, place, and time.  Skin: Skin is warm and dry.  Psychiatric: He has a normal mood and affect. His behavior is normal. Judgment and thought content normal.        Assessment & Plan:     1. Gastroesophageal reflux disease, esophagitis presence not specified GI referral if needed. -  pantoprazole (PROTONIX) 40 MG tablet; Take 1 tablet (40 mg total) by mouth 2 (two) times daily.  Dispense: 60 tablet; Refill: 3  2. Allergic rhinitis, unspecified seasonality, unspecified trigger   3. Flu vaccine need  - Flu vaccine HIGH DOSE PF (Fluzone High dose)  I have done the exam and reviewed the chart and it is accurate to the best of my knowledge. Development worker, community has been used and  any errors in dictation or transcription are unintentional. Miguel Aschoff M.D. [place], MD  [facility]

## 2018-02-27 ENCOUNTER — Telehealth: Payer: Self-pay | Admitting: Family Medicine

## 2018-02-27 NOTE — Telephone Encounter (Signed)
Please review. Thanks!  

## 2018-02-27 NOTE — Telephone Encounter (Signed)
Pt is asking about the Zantac being taken off of the shelf.  Pt wanting to know what other medication Dr. [name] would recommend for him to take it's place.  Please advise.  Thanks, [employer]

## 2018-02-28 NOTE — Telephone Encounter (Signed)
pepcid 40mg  BID

## 2018-02-28 NOTE — Telephone Encounter (Signed)
Patient advised.

## 2018-03-05 DIAGNOSIS — N401 Enlarged prostate with lower urinary tract symptoms: Secondary | ICD-10-CM | POA: Diagnosis not present

## 2018-03-11 ENCOUNTER — Ambulatory Visit: Payer: Medicare Other | Admitting: Family Medicine

## 2018-03-11 ENCOUNTER — Ambulatory Visit
Admission: RE | Admit: 2018-03-11 | Discharge: 2018-03-11 | Disposition: A | Discharge status: Home / Self Care | Payer: Medicare Other | Source: Ambulatory Visit | Attending: Family Medicine | Admitting: Family Medicine

## 2018-03-11 ENCOUNTER — Encounter: Payer: Self-pay | Admitting: Family Medicine

## 2018-03-11 VITALS — BP 122/70 | HR 69 | Temp 99.1°F | Resp 16 | Wt 220.0 lb

## 2018-03-11 DIAGNOSIS — R042 Hemoptysis: Secondary | ICD-10-CM | POA: Diagnosis not present

## 2018-03-11 DIAGNOSIS — K219 Gastro-esophageal reflux disease without esophagitis: Secondary | ICD-10-CM

## 2018-03-11 DIAGNOSIS — J45909 Unspecified asthma, uncomplicated: Secondary | ICD-10-CM | POA: Diagnosis not present

## 2018-03-11 NOTE — Patient Instructions (Signed)
Start Zyrtec for post nasal drainage. We will call with the x-ray results.

## 2018-03-11 NOTE — Progress Notes (Signed)
  Subjective:     Patient ID: Daniel [name], male   DOB: 1950/08/20, 67 y.o.   MRN: [id number] Chief Complaint  Patient presents with  . Cough    Patient comes in office today with concerns of productive cough with bloody sputum. Patient reports that symptom began about 10 days ago after going on a wine tasting he had noticed that his acid level was up and had symptoms or reflux, patient states that day after visiting vineyard he developed a cough which later on become productive of bloody sputum. Patient reports that cough was still present but he only had one episode where he noticed blood until this morning when he started coughing.    HPI States he had a severe episode of reflux 10 days ago which awakened him at night: "I could not breathe." He had a bout of coughing which then subsided with his use of PPI and famotidine. He was ok until 7 days ago when he noticed blood on one occasion in his expectorate. This recurred twice in the last 24 hours. No blood in sputum during his office visit. He is concerned about possibility of CA due to his past hx of smoking. Reports mild PND due to allergies but no cold sx. He is not on Zyrtec at this time.  Review of Systems     Objective:   Physical Exam  Constitutional: He appears well-developed and well-nourished. No distress.  Abdominal: Soft. There is no tenderness.  Ears: T.M's intact without inflammation Throat: no tonsillar enlargement or exudate Neck: no cervical adenopathy Lungs: clear     Assessment:    1. Gastroesophageal reflux disease, esophagitis presence not specified: continue current medication  2. Hemoptysis - DG Chest 2 View; Future    Plan:    Resume Zyrtec for PND. Further f/u pending CXR and discussion with his M.D., Dr. [name].

## 2018-03-12 ENCOUNTER — Other Ambulatory Visit: Payer: Self-pay | Admitting: Family Medicine

## 2018-03-12 DIAGNOSIS — R042 Hemoptysis: Secondary | ICD-10-CM

## 2018-03-12 NOTE — Progress Notes (Unsigned)
ref

## 2018-03-13 ENCOUNTER — Ambulatory Visit: Payer: Medicare Other | Admitting: Family Medicine

## 2018-03-13 DIAGNOSIS — N401 Enlarged prostate with lower urinary tract symptoms: Secondary | ICD-10-CM | POA: Diagnosis not present

## 2018-03-13 DIAGNOSIS — N5201 Erectile dysfunction due to arterial insufficiency: Secondary | ICD-10-CM | POA: Diagnosis not present

## 2018-03-18 DIAGNOSIS — R361 Hematospermia: Secondary | ICD-10-CM | POA: Diagnosis not present

## 2018-03-18 DIAGNOSIS — N401 Enlarged prostate with lower urinary tract symptoms: Secondary | ICD-10-CM | POA: Diagnosis not present

## 2018-03-20 ENCOUNTER — Encounter: Payer: Self-pay | Admitting: Pulmonary Disease

## 2018-03-20 ENCOUNTER — Ambulatory Visit: Payer: Medicare Other | Admitting: Pulmonary Disease

## 2018-03-20 VITALS — BP 112/80 | HR 69 | Ht 70.25 in | Wt 215.2 lb

## 2018-03-20 DIAGNOSIS — K225 Diverticulum of esophagus, acquired: Secondary | ICD-10-CM

## 2018-03-20 DIAGNOSIS — J690 Pneumonitis due to inhalation of food and vomit: Secondary | ICD-10-CM

## 2018-03-20 DIAGNOSIS — R042 Hemoptysis: Secondary | ICD-10-CM | POA: Diagnosis not present

## 2018-03-20 DIAGNOSIS — K219 Gastro-esophageal reflux disease without esophagitis: Secondary | ICD-10-CM

## 2018-03-20 NOTE — Patient Instructions (Signed)
1) we have reviewed your chest x-ray and it is clear. You likely had an episode of aspiration pneumonitis (chemical pneumonia) after your reflux issue. This likely cause the small amount of bleeding you noted. In addition you have a history of Zenker's diverticulum and this could also have been aggravated during this episode.  2) if this sensation of a foreign object continues in your throat you may need to be evaluated by Ear Nose and Throat (ENT).  3) these return as needed continue your anti-reflux measures. Continue your anti-reflux medication.  4) please return if symptoms recur or any new issues develop with regards to your breathing.

## 2018-03-22 DIAGNOSIS — K225 Diverticulum of esophagus, acquired: Secondary | ICD-10-CM | POA: Insufficient documentation

## 2018-03-22 DIAGNOSIS — K219 Gastro-esophageal reflux disease without esophagitis: Secondary | ICD-10-CM | POA: Insufficient documentation

## 2018-03-22 NOTE — Progress Notes (Signed)
Subjective:    Patient ID: Daniel [name], male    DOB: June 25, 1950, 67 y.o.   MRN: [id number]  HPI Daniel [name] is a 67 year old remote former smoker, who presents for evaluation of an episode of streaky hemoptysis that occurred several weeks ago. He is kindly referred by Dr. Miguel Aschoff. The patient states that his issues started approximately two weeks ago after he went for a wine tasting. He states that he ate quite a bit during that time and also drank considerable amount of wine. That night while sleeping he woke up after having had an episode of reflux with associated regurgitation of stomach contents and he states that he clearly aspirated as he had to sit up on the side of the bed and gasped for air after it occurred. Cough violently that night and the following day. He noted streaky hemoptysis the following day also noticed heavy sinus drainage. Subsequently his sputum turned green to greenish brown, all of these issues now however have resolved. He had a chest x-ray which I have also reviewed that showed no pathology. The patient was referred here for evaluation of this issue. The patient continues to have globus sensation. States that he has had an EGD which has shown a hiatal hernia and he is on pantoprazole twice a day and sit at bedtime. He has been evaluated previously by ENT and has been noted to have a Zenker's diverticulum this diagnosis was made in 1990. At that time there was no indication for surgery. Patient is not noted any fevers, chills or sweats. Cough has resolved. No further hemoptysis.  The patient has lost 30 pounds of weight since January but states that this is been intentional, he has been dieting. He used to smoke a pack of cigarettes per day between 1969 to 1991. 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 He has no military history. No exotic pets. Has a dog who is a short haired breed. He used to work  but does not do this currently. Previously he lived in [place] and also in [place]. He has not had any recent travel outside the country. No exposure to tuberculosis.    Review of Systems  Constitutional: Negative.   HENT: Positive for congestion, sinus pressure and trouble swallowing.   Eyes: Negative.   Respiratory: Positive for cough (Episode of hemoptysis resolved.).   Cardiovascular: Negative.   Gastrointestinal:       Seems to have issues with gastroesophageal reflux are quite significant. Otherwise review of systems from the G.I. standpoint is negative  Endocrine: Negative.   Genitourinary: Negative.   Musculoskeletal: Negative.   Skin: Negative.   Allergic/Immunologic: Negative.   Neurological: Negative.   Hematological: Negative.   Psychiatric/Behavioral: Negative.   All other systems reviewed and are negative.      Objective:   Physical Exam  Constitutional: He is oriented to person, place, and time. He appears well-developed and well-nourished. No distress.  HENT:  Head: Normocephalic and atraumatic.  Nose: Nose normal.  Mouth/Throat: Oropharynx is clear and moist. No oropharyngeal exudate.  Does have significant halitosis.  Eyes: Pupils are equal, round, and reactive to light. Conjunctivae are normal. No scleral icterus.  Neck: Neck supple. No JVD present. No tracheal deviation present. No thyromegaly present.  Cardiovascular: Normal rate, regular rhythm, normal heart sounds and intact distal pulses.  Pulmonary/Chest: Effort normal and breath sounds normal. No respiratory distress.  Abdominal: Soft. Bowel sounds  are normal. He exhibits no distension.  Musculoskeletal: He exhibits no edema or deformity.  Neurological: He is alert and oriented to person, place, and time.  No focal deficit  Skin: Skin is warm and dry. He is not diaphoretic.  Psychiatric: He has a normal mood and affect. His behavior is normal.  Nursing note and vitals reviewed.   I  have reviewed the chest x-ray. Lateral performed on 11 March 2018 and this was normal.      Assessment & Plan:   1) Hemoptysis (minor) occurring after very forceful coughing, this is now resolved. I suspect that this was in response to forceful coughing after significant aspiration of G.I. contents into the respiratory tract. The patient also has unknowns anchors diverticulum and this likely was irritated as well during this episode. X-ray does not suggest infectious process present or mass. Patient will continue to monitor his symptoms. If he notes any recurrence of hemoptysis without precipitating event, he will notify us so we can revisit this issue.  2) Aspiration pneumonitis, this was likely chemical pneumonitis from G.I. tract contents aspirated. The patient did have evidence of purulent sputum for 1 to 2 days but now this has also resolved. He has not had any fevers, chills or sweats. No evidence of pneumonia on chest x-ray. Aspiration pneumonitis is usually self-limiting.  3) Gastroesophageal reflux disease, this issue adds complexity to his management. He is currently doing somewhat better with use of PPI and nighttime H2 blocker. He was instructed on proper anti-reflux measures. Patient will continue the same. If his reflux symptoms persist recommend that he be referred to G.I.  4) Zenker's (hypopharyngeal) diverticulum, these diverticula can sometimes bleed, patient had a significant aspiration event which can be aggravated by the diverticulum. The patient continues to have globus sensation, this may be related to the Zenker's diverticulum. I have recommended that if the symptoms persist he should be reevaluated by ENT. He may need surgery to correct this.  Thank you for allowing me to participate in this patient's care.

## 2018-03-26 ENCOUNTER — Encounter: Payer: Self-pay | Admitting: Family Medicine

## 2018-03-26 ENCOUNTER — Ambulatory Visit: Payer: Medicare Other | Admitting: Family Medicine

## 2018-03-26 VITALS — BP 110/68 | Temp 98.8°F | Wt 221.0 lb

## 2018-03-26 DIAGNOSIS — J069 Acute upper respiratory infection, unspecified: Secondary | ICD-10-CM

## 2018-03-26 MED ORDER — HYDROCODONE-HOMATROPINE 5-1.5 MG/5ML PO SYRP: 5.0000 mL | ORAL_SOLUTION | Freq: Four times a day (QID) | ORAL | 0 refills | Status: AC | PRN

## 2018-03-26 NOTE — Patient Instructions (Signed)
Discussed use of Mucinex D and saline for congestion .Let me know if sinuses not improving over a week.

## 2018-03-26 NOTE — Progress Notes (Signed)
  Subjective:     Patient ID: Daniel [name], male   DOB: 13-May-1951, 67 y.o.   MRN: [id number] Chief Complaint  Patient presents with  . URI    Patient presents today for possible laryngitis. Patient is having the following symptoms cough with mucus mixed with pus and sore throat since Sunday, 03/24/2018. PAtient states he has taking his Cetirizine to help with symptom control.   HPI Reports cold sx but no fever.  Review of Systems     Objective:   Physical Exam  Constitutional: He appears well-developed and well-nourished. No distress.  Ears: T.M's intact without inflammation Throat: no tonsillar enlargement or exudate Neck: no cervical adenopathy Lungs: clear     Assessment:    1. URI, acute - HYDROcodone-homatropine (HYCODAN) 5-1.5 MG/5ML syrup; Take 5 mLs by mouth every 6 (six) hours as needed for up to 5 days. 5 ml 4-6 hours as needed for cough  Dispense: 100 mL; Refill: 0    Plan:    Discussed use of Mucinex D. Call if not improving over the week.

## 2018-04-09 ENCOUNTER — Ambulatory Visit: Payer: Medicare Other

## 2018-04-17 ENCOUNTER — Ambulatory Visit: Payer: Self-pay | Admitting: Family Medicine

## 2018-04-18 ENCOUNTER — Ambulatory Visit (INDEPENDENT_AMBULATORY_CARE_PROVIDER_SITE_OTHER): Payer: Medicare Other | Admitting: Family Medicine

## 2018-04-18 VITALS — BP 116/80 | HR 69 | Temp 98.0°F | Resp 16 | Ht 70.0 in | Wt 216.0 lb

## 2018-04-18 DIAGNOSIS — Z0000 Encounter for general adult medical examination without abnormal findings: Secondary | ICD-10-CM

## 2018-04-18 DIAGNOSIS — E785 Hyperlipidemia, unspecified: Secondary | ICD-10-CM | POA: Diagnosis not present

## 2018-04-18 DIAGNOSIS — Z23 Encounter for immunization: Secondary | ICD-10-CM

## 2018-04-18 DIAGNOSIS — E038 Other specified hypothyroidism: Secondary | ICD-10-CM | POA: Diagnosis not present

## 2018-04-18 NOTE — Progress Notes (Signed)
Patient: Daniel [name], Male    DOB: 1951/01/14, 67 y.o.   MRN: [id number] Visit Date: 04/18/2018  Today's Provider: Wilhemena Durie, MD   Chief Complaint  Patient presents with  . Annual Exam   Subjective:  Daniel [name] is a 67 y.o. male who presents today for health maintenance and complete physical. He feels well. He reports exercising 5 times weekly. He reports he is sleeping well.  11/19/17 Colonoscopy-repeat 5 years  Review of Systems  Constitutional: Negative.   HENT: Negative.   Eyes: Negative.   Respiratory: Negative.   Cardiovascular: Negative.   Gastrointestinal: Negative.   Endocrine: Negative.   Genitourinary: Negative.   Musculoskeletal: Negative.   Skin: Negative.   Allergic/Immunologic: Negative.   Neurological: Negative.   Hematological: Negative.   Psychiatric/Behavioral: Negative.     Social History   Socioeconomic History  . Marital status: Widowed    Spouse name: Not on file  . Number of children: Not on file  . Years of education: Not on file  . Highest education level: Not on file  Occupational History  . Not on file  Social Needs  . Financial resource strain: Not on file  . Food insecurity:    Worry: Not on file    Inability: Not on file  . Transportation needs:    Medical: Not on file    Non-medical: Not on file  Tobacco Use  . Smoking status: Former Smoker    Types: Cigarettes    Last attempt to quit: 1991    Years since quitting: 28.9  . Smokeless tobacco: Never Used  . Tobacco comment: quit smoking 25 years ago  Substance and Sexual Activity  . Alcohol use: Yes    Comment: 1-2 glasses of wine 4-5 times a week  . Drug use: No  . Sexual activity: Not on file  Lifestyle  . Physical activity:    Days per week: Not on file    Minutes per session: Not on file  . Stress: Not on file  Relationships  . Social connections:    Talks on phone: Not on file    Gets together: Not on file    Attends religious service: Not on file     Active member of club or organization: Not on file    Attends meetings of clubs or organizations: Not on file    Relationship status: Not on file  . Intimate partner violence:    Fear of current or ex partner: Not on file    Emotionally abused: Not on file    Physically abused: Not on file    Forced sexual activity: Not on file  Other Topics Concern  . Not on file  Social History Narrative  . Not on file    Patient Active Problem List   Diagnosis Date Noted  . Zenker's (hypopharyngeal) diverticulum 03/22/2018  . Gastroesophageal reflux disease 03/22/2018  . Fatty liver 10/09/2017  . BPH (benign prostatic hyperplasia) 11/21/2014  . Hypothyroidism 11/21/2014  . Obesity 11/21/2014  . Asthma 11/21/2014  . Inguinal hernia 11/21/2014  . OA (osteoarthritis) 11/21/2014  . H/O adenomatous polyp of colon 10/14/2014    Past Surgical History:  Procedure Laterality Date  . CATARACT EXTRACTION    . COLON SURGERY    . COLONOSCOPY WITH PROPOFOL N/A 11/18/2014   Procedure: COLONOSCOPY WITH PROPOFOL;  Surgeon: [name], MD;  Location: [facility] ENDOSCOPY;  Service: Endoscopy;  Laterality: N/A;  . EYE SURGERY    . KNEE SURGERY    .  NASAL SEPTUM SURGERY      His family history includes Arthritis in his brother; Cervical cancer in his mother; Colon cancer in his mother; Colon polyps in his mother; Heart attack in his brother; Hypertension in his father; Leukemia in his father; Osteoporosis in his mother; Throat cancer in his maternal grandmother.     Outpatient Encounter Medications as of 04/18/2018  Medication Sig Note  . cetirizine (ZYRTEC) 10 MG tablet Take 1 tablet (10 mg total) by mouth daily.   . Famotidine (PEPCID PO) Take by mouth.   . Ginkgo Biloba 40 MG TABS Take by mouth. 05/24/2016: Received from: [place]: Take by mouth once daily.  Marland Kitchen levothyroxine (SYNTHROID, LEVOTHROID) 100 MCG tablet TAKE 1 TABLET BY MOUTH EVERY DAY BEFORE BREAKFAST    . Multiple Vitamin (MULTIVITAMIN) capsule Take 1 capsule daily by mouth.   . Omega-3 Fatty Acids (FISH OIL) 1200 MG CPDR Take by mouth.   . pantoprazole (PROTONIX) 40 MG tablet Take 1 tablet (40 mg total) by mouth 2 (two) times daily.    Facility-Administered Encounter Medications as of 04/18/2018  Medication  . ranitidine (ZANTAC) tablet 150 mg    Patient Care Team: Jerrol Banana., MD as PCP - General (Family Medicine)      Objective:   Vitals:  Vitals:   04/18/18 0906  BP: 116/80  Pulse: 69  Resp: 16  Temp: 98 F (36.7 C)  TempSrc: Oral  Weight: 216 lb (98 kg)  Height: 5\' 10"  (1.778 m)    Physical Exam  Constitutional: He is oriented to person, place, and time. He appears well-developed and well-nourished.  HENT:  Head: Normocephalic and atraumatic.  Right Ear: External ear normal.  Left Ear: External ear normal.  Nose: Nose normal.  Mouth/Throat: Oropharynx is clear and moist.  Eyes: Pupils are equal, round, and reactive to light. Conjunctivae and EOM are normal.  Neck: Normal range of motion. Neck supple.  Cardiovascular: Normal rate, regular rhythm, normal heart sounds and intact distal pulses.  Pulmonary/Chest: Effort normal and breath sounds normal.  Abdominal: Soft. Bowel sounds are normal.  Genitourinary: Rectum normal, prostate normal and penis normal.  Musculoskeletal: Normal range of motion.  Trace edema.  Neurological: He is oriented to person, place, and time.  Skin: Skin is warm and dry.  Psychiatric: He has a normal mood and affect. His behavior is normal. Judgment and thought content normal.   Fall Risk  04/18/2018 04/11/2017 04/06/2016 01/27/2015  Falls in the past year? 0 No No No   Depression Screen PHQ 2/9 Scores 04/18/2018 04/11/2017 04/06/2016 01/27/2015  PHQ - 2 Score 0 0 0 0  PHQ- 9 Score - 0 - -   Home Exercise  04/18/2018 04/11/2017 04/06/2016  Current Exercise Habits Home exercise routine Home exercise routine Home exercise  routine  Type of exercise - - walking  Time (Minutes) - - 40  Frequency (Times/Week) 5 5 6   Weekly Exercise (Minutes/Week) - - 240     Office Visit from 04/18/2018 in [place]  AUDIT-C Score  3     Functional Status Survey: Is the patient deaf or have difficulty hearing?: Yes(Bilateral hearing aids) Does the patient have difficulty seeing, even when wearing glasses/contacts?: No Does the patient have difficulty concentrating, remembering, or making decisions?: No Does the patient have difficulty walking or climbing stairs?: No Does the patient have difficulty dressing or bathing?: No Does the patient have difficulty doing errands alone such as visiting a  doctor's office or shopping?: No  6CIT Screen 04/18/2018 04/06/2016  What Year? 0 points 0 points  What month? 0 points 0 points  What time? 0 points 0 points  Count back from 20 0 points 0 points  Months in reverse 0 points 0 points  Repeat phrase 0 points 4 points  Total Score 0 4       Assessment & Plan:     Routine Health Maintenance and Physical Exam  Exercise Activities and Dietary recommendations Goals   None     Immunization History  Administered Date(s) Administered  . Influenza, High Dose Seasonal PF 02/25/2015, 02/15/2017, 02/13/2018  . Influenza, Seasonal, Injecte, Preservative Fre 06/26/2016  . Pneumococcal Conjugate-13 01/27/2015  . Tdap 01/23/2007  . Zoster 05/31/2010    Health Maintenance  Topic Date Due  . PNA vac Low Risk Adult (2 of 2 - PPSV23) 01/27/2016  . COLONOSCOPY  11/20/2022  . TETANUS/TDAP  03/10/2024  . INFLUENZA VACCINE  Completed  . Hepatitis C Screening  Completed    Colonsocopy earlier this year. Discussed health benefits of physical activity, and encouraged him to engage in regular exercise appropriate for his age and condition.  1. Annual physical exam   2. Other specified hypothyroidism  - Comprehensive metabolic panel - TSH  3. Hyperlipidemia,  unspecified hyperlipidemia type  - CBC with Differential/Platelet - Lipid Panel With LDL/HDL Ratio  4. Need for pneumococcal vaccination  - Pneumococcal polysaccharide vaccine 23-valent greater than or equal to 2yo subcutaneous/IM    I have done the exam and reviewed the chart and it is accurate to the best of my knowledge. Development worker, community has been used and  any errors in dictation or transcription are unintentional. Miguel Aschoff M.D. [facility]

## 2018-04-19 LAB — LIPID PANEL WITH LDL/HDL RATIO
Cholesterol, Total: 192 mg/dL (ref 100–199)
HDL: 60 mg/dL (ref >39)
LDL Calculated: 117 mg/dL — ABNORMAL HIGH (ref 0–99)
LDL/HDL RATIO: 2 ratio (ref 0.0–3.6)
TRIGLYCERIDES: 75 mg/dL (ref 0–149)
VLDL Cholesterol Cal: 15 mg/dL (ref 5–40)

## 2018-04-19 LAB — CBC WITH DIFFERENTIAL/PLATELET
Basophils Absolute: 0.1 10*3/uL (ref 0.0–0.2)
Basos: 1 %
EOS (ABSOLUTE): 0.3 10*3/uL (ref 0.0–0.4)
Eos: 5 %
HEMATOCRIT: 44.2 % (ref 37.5–51.0)
Hemoglobin: 15.5 g/dL (ref 13.0–17.7)
IMMATURE GRANULOCYTES: 0 %
Immature Grans (Abs): 0 10*3/uL (ref 0.0–0.1)
Lymphocytes Absolute: 1.1 10*3/uL (ref 0.7–3.1)
Lymphs: 19 %
MCH: 29.8 pg (ref 26.6–33.0)
MCHC: 35.1 g/dL (ref 31.5–35.7)
MCV: 85 fL (ref 79–97)
MONOS ABS: 0.6 10*3/uL (ref 0.1–0.9)
Monocytes: 9 %
Neutrophils Absolute: 3.9 10*3/uL (ref 1.4–7.0)
Neutrophils: 66 %
Platelets: 254 10*3/uL (ref 150–450)
RBC: 5.2 x10E6/uL (ref 4.14–5.80)
RDW: 12.7 % (ref 12.3–15.4)
WBC: 5.9 10*3/uL (ref 3.4–10.8)

## 2018-04-19 LAB — COMPREHENSIVE METABOLIC PANEL
A/G RATIO: 1.9 (ref 1.2–2.2)
ALT: 16 IU/L (ref 0–44)
AST: 17 IU/L (ref 0–40)
Albumin: 4.1 g/dL (ref 3.6–4.8)
Alkaline Phosphatase: 66 IU/L (ref 39–117)
BILIRUBIN TOTAL: 0.6 mg/dL (ref 0.0–1.2)
BUN/Creatinine Ratio: 13 (ref 10–24)
BUN: 12 mg/dL (ref 8–27)
CALCIUM: 9 mg/dL (ref 8.6–10.2)
CO2: 22 mmol/L (ref 20–29)
Chloride: 103 mmol/L (ref 96–106)
Creatinine, Ser: 0.89 mg/dL (ref 0.76–1.27)
GFR calc Af Amer: 102 mL/min/{1.73_m2} (ref >59)
GFR, EST NON AFRICAN AMERICAN: 88 mL/min/{1.73_m2} (ref >59)
Globulin, Total: 2.2 g/dL (ref 1.5–4.5)
Glucose: 91 mg/dL (ref 65–99)
POTASSIUM: 4.3 mmol/L (ref 3.5–5.2)
SODIUM: 141 mmol/L (ref 134–144)
Total Protein: 6.3 g/dL (ref 6.0–8.5)

## 2018-04-19 LAB — TSH: TSH: 2.34 u[IU]/mL (ref 0.450–4.500)

## 2018-04-22 ENCOUNTER — Telehealth: Payer: Self-pay

## 2018-04-22 NOTE — Telephone Encounter (Signed)
Patient advised.

## 2018-04-22 NOTE — Telephone Encounter (Signed)
Left message to call back  

## 2018-04-22 NOTE — Telephone Encounter (Signed)
-----   Message from Jerrol Banana., MD sent at 04/22/2018  2:20 PM EST ----- Labs stable

## 2018-05-08 DIAGNOSIS — L309 Dermatitis, unspecified: Secondary | ICD-10-CM | POA: Diagnosis not present

## 2018-05-08 DIAGNOSIS — L821 Other seborrheic keratosis: Secondary | ICD-10-CM | POA: Diagnosis not present

## 2018-05-08 DIAGNOSIS — L820 Inflamed seborrheic keratosis: Secondary | ICD-10-CM | POA: Diagnosis not present

## 2018-05-09 ENCOUNTER — Encounter: Payer: Self-pay | Admitting: Family Medicine

## 2018-05-09 ENCOUNTER — Ambulatory Visit: Payer: Medicare Other | Admitting: Family Medicine

## 2018-05-09 VITALS — BP 124/80 | HR 73 | Temp 98.0°F | Ht 73.0 in | Wt 219.2 lb

## 2018-05-09 DIAGNOSIS — J069 Acute upper respiratory infection, unspecified: Secondary | ICD-10-CM

## 2018-05-09 MED ORDER — HYDROCODONE-HOMATROPINE 5-1.5 MG/5ML PO SYRP: 5.0000 mL | ORAL_SOLUTION | Freq: Four times a day (QID) | ORAL | 0 refills | Status: AC | PRN

## 2018-05-09 NOTE — Progress Notes (Signed)
  Subjective:     Patient ID: Daniel [name], male   DOB: 1951-02-28, 67 y.o.   MRN: [id number] Chief Complaint  Patient presents with  . URI    Patient comes into office todaya with complaints of cold like symptoms for the past 5 days. Patient reports the following; cough productive of sputum, chest congestion, runny nose, ear pressure, sneezing and sore throat. Patient has tried taking otc Mucinex and Delsym for relief.    HPI No fever or chills reported. Reports exposure to a sick child.  Review of Systems     Objective:   Physical Exam Constitutional:      General: He is not in acute distress.    Appearance: Normal appearance. He is ill-appearing.   Ears: T.M's intact without inflammation Throat: no tonsillar enlargement or exudate Neck: no cervical adenopathy Lungs: clear     Assessment:    1. URI, acute - HYDROcodone-homatropine (HYCODAN) 5-1.5 MG/5ML syrup; Take 5 mLs by mouth every 6 (six) hours as needed for up to 5 days. 5 ml 4-6 hours as needed for cough  Dispense: 100 mL; Refill: 0    Plan:    Continue Mucinex D and Delsym as needed. Call if not improving over the next 2-3 days.

## 2018-05-09 NOTE — Patient Instructions (Signed)
Continue Mucinex D and Delsym as needed. Call me if not improving over the weekend.

## 2018-05-13 ENCOUNTER — Other Ambulatory Visit: Payer: Self-pay | Admitting: Family Medicine

## 2018-05-13 ENCOUNTER — Telehealth: Payer: Self-pay | Admitting: Family Medicine

## 2018-05-13 MED ORDER — AMOXICILLIN-POT CLAVULANATE 875-125 MG PO TABS: 1.0000 | ORAL_TABLET | Freq: Two times a day (BID) | ORAL | 0 refills | Status: DC

## 2018-05-13 NOTE — Telephone Encounter (Signed)
I have sent Augmentin to [facility] to cover for sinus infection

## 2018-05-13 NOTE — Telephone Encounter (Signed)
Patient was advised. KW 

## 2018-05-13 NOTE — Telephone Encounter (Signed)
Pt wanting to let Mikki Santee know he is not feeling better.  He is still coughing up congestion and his head is still stopped up.  Please advise.  Thanks, [employer]

## 2018-05-13 NOTE — Telephone Encounter (Signed)
Please review chart and advise. KW 

## 2018-06-12 DIAGNOSIS — K760 Fatty (change of) liver, not elsewhere classified: Secondary | ICD-10-CM | POA: Diagnosis not present

## 2018-06-14 ENCOUNTER — Other Ambulatory Visit: Payer: Self-pay | Admitting: Nurse Practitioner

## 2018-06-14 DIAGNOSIS — K760 Fatty (change of) liver, not elsewhere classified: Secondary | ICD-10-CM

## 2018-06-26 ENCOUNTER — Ambulatory Visit
Admission: RE | Admit: 2018-06-26 | Discharge: 2018-06-26 | Disposition: A | Discharge status: Home / Self Care | Payer: Medicare Other | Source: Ambulatory Visit | Attending: Nurse Practitioner | Admitting: Nurse Practitioner

## 2018-06-26 DIAGNOSIS — K760 Fatty (change of) liver, not elsewhere classified: Secondary | ICD-10-CM | POA: Diagnosis not present

## 2018-07-03 ENCOUNTER — Ambulatory Visit: Payer: Medicare Other | Admitting: Family Medicine

## 2018-07-03 ENCOUNTER — Encounter: Payer: Self-pay | Admitting: Family Medicine

## 2018-07-03 VITALS — BP 124/82 | HR 80 | Temp 98.5°F | Wt 225.4 lb

## 2018-07-03 DIAGNOSIS — J0140 Acute pansinusitis, unspecified: Secondary | ICD-10-CM

## 2018-07-03 MED ORDER — AMOXICILLIN-POT CLAVULANATE 875-125 MG PO TABS: 1.0000 | ORAL_TABLET | Freq: Two times a day (BID) | ORAL | 0 refills | Status: DC

## 2018-07-03 NOTE — Progress Notes (Signed)
Patient: Daniel [name] Male    DOB: 1950-09-04   68 y.o.   MRN: [id number] Visit Date: 07/03/2018  Today's Provider: Wilhemena Durie, MD   Chief Complaint  Patient presents with  . URI   Subjective:     URI   This is a recurrent problem. The current episode started in the past 7 days. There has been no fever. Associated symptoms include congestion, coughing, headaches and sinus pain. He has tried decongestant for the symptoms. The treatment provided mild relief.    Allergies  Allergen Reactions  . Ciprofloxacin Hcl Other (See Comments)    Blisters break out  . Levaquin [Levofloxacin In D5w]   . Sulfa Antibiotics Other (See Comments)    unknown     Current Outpatient Medications:  .  Famotidine (PEPCID PO), Take by mouth., Disp: , Rfl:  .  Ginkgo Biloba 40 MG TABS, Take by mouth., Disp: , Rfl:  .  levothyroxine (SYNTHROID, LEVOTHROID) 100 MCG tablet, TAKE 1 TABLET BY MOUTH EVERY DAY BEFORE BREAKFAST, Disp: 90 tablet, Rfl: 3 .  Multiple Vitamin (MULTIVITAMIN) capsule, Take 1 capsule daily by mouth., Disp: , Rfl:  .  Omega-3 Fatty Acids (FISH OIL) 1200 MG CPDR, Take by mouth., Disp: , Rfl:  .  pantoprazole (PROTONIX) 40 MG tablet, Take 1 tablet (40 mg total) by mouth 2 (two) times daily., Disp: 60 tablet, Rfl: 3 .  amoxicillin-clavulanate (AUGMENTIN) 875-125 MG tablet, Take 1 tablet by mouth 2 (two) times daily. (Patient not taking: Reported on 07/03/2018), Disp: 20 tablet, Rfl: 0 .  cetirizine (ZYRTEC) 10 MG tablet, Take 1 tablet (10 mg total) by mouth daily. (Patient not taking: Reported on 05/09/2018), Disp: 30 tablet, Rfl: 11  Current Facility-Administered Medications:  .  ranitidine (ZANTAC) tablet 150 mg, 150 mg, Oral, BID, Jerrol Banana., MD  Review of Systems  Constitutional: Negative.   HENT: Positive for congestion, postnasal drip, sinus pressure and sinus pain.   Eyes: Negative.   Respiratory: Positive for cough.   Gastrointestinal: Negative.     Endocrine: Negative.   Allergic/Immunologic: Negative.   Neurological: Positive for headaches.  Psychiatric/Behavioral: Negative.     Social History   Tobacco Use  . Smoking status: Former Smoker    Types: Cigarettes    Last attempt to quit: 1991    Years since quitting: 29.1  . Smokeless tobacco: Never Used  . Tobacco comment: quit smoking 25 years ago  Substance Use Topics  . Alcohol use: Yes    Comment: 1-2 glasses of wine 4-5 times a week      Objective:   BP 124/82 (BP Location: Left Arm, Patient Position: Sitting, Cuff Size: Large)   Pulse 80   Temp 98.5 F (36.9 C) (Oral)   Wt 225 lb 6.4 oz (102.2 kg)   SpO2 99%   BMI 29.74 kg/m  Vitals:   07/03/18 1428  BP: 124/82  Pulse: 80  Temp: 98.5 F (36.9 C)  TempSrc: Oral  SpO2: 99%  Weight: 225 lb 6.4 oz (102.2 kg)     Physical Exam Constitutional:      Appearance: Normal appearance. He is well-developed and normal weight.  HENT:     Head: Normocephalic and atraumatic.     Right Ear: External ear normal.     Left Ear: External ear normal.     Nose: Nose normal.  Eyes:     Conjunctiva/sclera: Conjunctivae normal.     Pupils: Pupils are equal,  round, and reactive to light.  Neck:     Musculoskeletal: Normal range of motion and neck supple.  Cardiovascular:     Rate and Rhythm: Normal rate and regular rhythm.     Heart sounds: Normal heart sounds.  Pulmonary:     Effort: Pulmonary effort is normal.     Breath sounds: Normal breath sounds.  Abdominal:     General: Bowel sounds are normal.     Palpations: Abdomen is soft.  Genitourinary:    Penis: Normal.      Prostate: Normal.     Rectum: Normal.  Musculoskeletal: Normal range of motion.     Comments: Trace edema.  Skin:    General: Skin is warm and dry.  Neurological:     Mental Status: He is alert and oriented to person, place, and time.  Psychiatric:        Behavior: Behavior normal.        Thought Content: Thought content normal.         Judgment: Judgment normal.         Assessment & Plan    1. Acute pansinusitis, recurrence not specified Also use Robitussin and increase fluids while taking antibiotic. - amoxicillin-clavulanate (AUGMENTIN) 875-125 MG tablet; Take 1 tablet by mouth 2 (two) times daily.  Dispense: 20 tablet; Refill: 0  I, Porsha McClurkin, CMA, am acting as a scribe for Wilhemena Durie, MD.      I have done the exam and reviewed the above chart and it is accurate to the best of my knowledge. Development worker, community has been used in this note in any air is in the dictation or transcription are unintentional.  Wilhemena Durie, MD  [place]

## 2018-07-18 ENCOUNTER — Ambulatory Visit: Payer: Medicare Other | Admitting: Family Medicine

## 2018-07-18 ENCOUNTER — Encounter: Payer: Self-pay | Admitting: Family Medicine

## 2018-07-18 VITALS — BP 118/82 | HR 71 | Temp 98.6°F | Resp 16 | Wt 221.2 lb

## 2018-07-18 DIAGNOSIS — J209 Acute bronchitis, unspecified: Secondary | ICD-10-CM | POA: Diagnosis not present

## 2018-07-18 MED ORDER — HYDROCODONE-HOMATROPINE 5-1.5 MG/5ML PO SYRP: ORAL_SOLUTION | ORAL | 0 refills | Status: DC

## 2018-07-18 MED ORDER — CEFDINIR 300 MG PO CAPS: 300.0000 mg | ORAL_CAPSULE | Freq: Two times a day (BID) | ORAL | 0 refills | Status: DC

## 2018-07-18 NOTE — Progress Notes (Signed)
  Subjective:     Patient ID: Daniel [name], male   DOB: 03-11-1951, 68 y.o.   MRN: [id number] Chief Complaint  Patient presents with  . URI    Pt comes to the office today with complaints of sinus congestion, ear pressure, productive cough with sputum sometimes clear and sometimes yellow, headache and fever on Saturday of 100. Pt reports these symptoms started about 4 weeks ago. Pt was seen by Dr. [name] on 07/03/2018 and prescribed Augmentin. Pt states he had some relief with that medication. Pt reports that had also tried OTC mucinex and Zyrtec with some relief from the Zyrtec.    HPI States he improved some while on Augmentin but continues to bring up purulent sputum. Ears remain congested; sinuses are no longer draining.  Review of Systems  Respiratory: Negative for shortness of breath.        Objective:   Physical Exam Constitutional:      General: He is not in acute distress.    Appearance: He is not ill-appearing.  Neurological:     Mental Status: He is alert.   Ears: T.M's intact without inflammation Sinuses: non-tender Throat: no tonsillar enlargement or exudate Neck: no cervical adenopathy Lungs: clear     Assessment:    1. Acute bronchitis, unspecified organism - cefdinir (OMNICEF) 300 MG capsule; Take 1 capsule (300 mg total) by mouth 2 (two) times daily.  Dispense: 14 capsule; Refill: 0 - HYDROcodone-homatropine (HYCODAN) 5-1.5 MG/5ML syrup; 5 ml 4-6 hours as needed for cough  Dispense: 100 mL; Refill: 0    Plan:    Switch to Mucinex D and saline nasal spray.

## 2018-07-18 NOTE — Patient Instructions (Signed)
Discussed use of Mucinex D and salt water nasal spray for congestion.

## 2018-08-02 DIAGNOSIS — H903 Sensorineural hearing loss, bilateral: Secondary | ICD-10-CM | POA: Diagnosis not present

## 2018-08-02 DIAGNOSIS — H6980 Other specified disorders of Eustachian tube, unspecified ear: Secondary | ICD-10-CM | POA: Diagnosis not present

## 2018-08-12 ENCOUNTER — Other Ambulatory Visit: Payer: Self-pay

## 2018-08-12 DIAGNOSIS — K219 Gastro-esophageal reflux disease without esophagitis: Secondary | ICD-10-CM

## 2018-08-12 MED ORDER — LEVOTHYROXINE SODIUM 100 MCG PO TABS: ORAL_TABLET | ORAL | 3 refills | Status: DC

## 2018-08-12 MED ORDER — PANTOPRAZOLE SODIUM 40 MG PO TBEC: 40.0000 mg | DELAYED_RELEASE_TABLET | Freq: Two times a day (BID) | ORAL | 3 refills | Status: DC

## 2018-10-11 DIAGNOSIS — Z03818 Encounter for observation for suspected exposure to other biological agents ruled out: Secondary | ICD-10-CM | POA: Diagnosis not present

## 2018-10-16 ENCOUNTER — Ambulatory Visit: Payer: Self-pay | Admitting: Family Medicine

## 2018-11-04 ENCOUNTER — Encounter: Payer: Self-pay | Admitting: Family Medicine

## 2018-11-04 ENCOUNTER — Ambulatory Visit (INDEPENDENT_AMBULATORY_CARE_PROVIDER_SITE_OTHER): Payer: Medicare Other | Admitting: Family Medicine

## 2018-11-04 ENCOUNTER — Other Ambulatory Visit: Payer: Self-pay

## 2018-11-04 VITALS — BP 112/68 | HR 78 | Temp 98.5°F | Resp 16 | Ht 74.0 in | Wt 207.0 lb

## 2018-11-04 DIAGNOSIS — E038 Other specified hypothyroidism: Secondary | ICD-10-CM | POA: Diagnosis not present

## 2018-11-04 DIAGNOSIS — E6609 Other obesity due to excess calories: Secondary | ICD-10-CM | POA: Diagnosis not present

## 2018-11-04 DIAGNOSIS — N400 Enlarged prostate without lower urinary tract symptoms: Secondary | ICD-10-CM | POA: Diagnosis not present

## 2018-11-04 DIAGNOSIS — K219 Gastro-esophageal reflux disease without esophagitis: Secondary | ICD-10-CM | POA: Diagnosis not present

## 2018-11-04 DIAGNOSIS — Z6830 Body mass index (BMI) 30.0-30.9, adult: Secondary | ICD-10-CM

## 2018-11-04 NOTE — Patient Instructions (Signed)
Decrease pantoprazole to one a day

## 2018-11-04 NOTE — Progress Notes (Signed)
Patient: Daniel [name] Male    DOB: 31-May-1950   68 y.o.   MRN: [id number] Visit Date: 11/04/2018  Today's Provider: Wilhemena Durie, MD   Chief Complaint  Patient presents with  . Hyperlipidemia  . Hypothyroidism  . Gastroesophageal Reflux   Subjective:     HPI  Lipid/Cholesterol, Follow-up:   Last seen for this6 months ago.  Management changes since that visit include no changes. . Last Lipid Panel:    Component Value Date/Time   CHOL 192 04/18/2018 0956   TRIG 75 04/18/2018 0956   HDL 60 04/18/2018 0956   CHOLHDL 3.0 04/12/2017 0822   LDLCALC 117 (H) 04/18/2018 0956   LDLCALC 113 (H) 04/12/2017 5638    He reports good compliance with treatment. He is not having side effects.  Current symptoms include none and have been stable. Weight trend: stable Prior visit with dietician: no Current diet: well balanced Current exercise: walking  Wt Readings from Last 3 Encounters:  11/04/18 207 lb (93.9 kg)  07/18/18 221 lb 3.2 oz (100.3 kg)  07/03/18 225 lb 6.4 oz (102.2 kg)   Hypothyroidism, follow up: Patient was last seen in the office 6 months ago. No changes were made in his medications. He is currently taking levothyroxine 112mcg daily and reports good compliance, and good symptom control.  Lab Results  Component Value Date   TSH 2.340 04/18/2018    GERD, follow up: Patient was last seen in the office 6 months ago. No changes were made in his medications. He is currently taking pantoprazole 40mg  BID and reports good compliance and good symptom control.  Reflux control is been very good. Allergies  Allergen Reactions  . Ciprofloxacin Hcl Other (See Comments)    Blisters break out  . Levaquin [Levofloxacin In D5w]   . Sulfa Antibiotics Other (See Comments)    unknown     Current Outpatient Medications:  .  amoxicillin-clavulanate (AUGMENTIN) 875-125 MG tablet, Take 1 tablet by mouth 2 (two) times daily., Disp: 20 tablet, Rfl: 0 .  cefdinir  (OMNICEF) 300 MG capsule, Take 1 capsule (300 mg total) by mouth 2 (two) times daily., Disp: 14 capsule, Rfl: 0 .  cetirizine (ZYRTEC) 10 MG tablet, Take 1 tablet (10 mg total) by mouth daily., Disp: 30 tablet, Rfl: 11 .  Famotidine (PEPCID PO), Take by mouth., Disp: , Rfl:  .  Ginkgo Biloba 40 MG TABS, Take by mouth., Disp: , Rfl:  .  HYDROcodone-homatropine (HYCODAN) 5-1.5 MG/5ML syrup, 5 ml 4-6 hours as needed for cough, Disp: 100 mL, Rfl: 0 .  levothyroxine (SYNTHROID, LEVOTHROID) 100 MCG tablet, TAKE 1 TABLET BY MOUTH EVERY DAY BEFORE BREAKFAST, Disp: 90 tablet, Rfl: 3 .  Multiple Vitamin (MULTIVITAMIN) capsule, Take 1 capsule daily by mouth., Disp: , Rfl:  .  Omega-3 Fatty Acids (FISH OIL) 1200 MG CPDR, Take by mouth., Disp: , Rfl:  .  pantoprazole (PROTONIX) 40 MG tablet, Take 1 tablet (40 mg total) by mouth 2 (two) times daily., Disp: 60 tablet, Rfl: 3  Current Facility-Administered Medications:  .  ranitidine (ZANTAC) tablet 150 mg, 150 mg, Oral, BID, Jerrol Banana., MD  Review of Systems  Constitutional: Negative for activity change, appetite change, chills, fatigue and fever.  Respiratory: Negative for cough and shortness of breath.   Cardiovascular: Negative for chest pain, palpitations and leg swelling.  Gastrointestinal: Negative for abdominal distention, abdominal pain, constipation, diarrhea, nausea and rectal pain.  Musculoskeletal: Positive for arthralgias.  Allergic/Immunologic: Negative for environmental allergies.  Neurological: Negative for dizziness.  Psychiatric/Behavioral: Negative for agitation, self-injury, sleep disturbance and suicidal ideas. The patient is not nervous/anxious.     Social History   Tobacco Use  . Smoking status: Former Smoker    Types: Cigarettes    Last attempt to quit: 1991    Years since quitting: 29.4  . Smokeless tobacco: Never Used  . Tobacco comment: quit smoking 25 years ago  Substance Use Topics  . Alcohol use: Yes     Comment: 1-2 glasses of wine 4-5 times a week      Objective:   BP 112/68 (BP Location: Right Arm, Patient Position: Sitting, Cuff Size: Large)   Pulse 78   Temp 98.5 F (36.9 C)   Resp 16   Ht 6\' 2"  (1.88 m)   Wt 207 lb (93.9 kg) Comment: per patient  SpO2 97%   BMI 26.58 kg/m  Vitals:   11/04/18 1457  BP: 112/68  Pulse: 78  Resp: 16  Temp: 98.5 F (36.9 C)  SpO2: 97%  Weight: 207 lb (93.9 kg)  Height: 6\' 2"  (1.88 m)     Physical Exam Constitutional:      Appearance: He is well-developed.  HENT:     Head: Normocephalic and atraumatic.     Right Ear: External ear normal.     Left Ear: External ear normal.     Nose: Nose normal.  Eyes:     Conjunctiva/sclera: Conjunctivae normal.     Pupils: Pupils are equal, round, and reactive to light.  Neck:     Musculoskeletal: Normal range of motion and neck supple.  Cardiovascular:     Rate and Rhythm: Normal rate and regular rhythm.     Heart sounds: Normal heart sounds.  Pulmonary:     Effort: Pulmonary effort is normal.     Breath sounds: Normal breath sounds.  Abdominal:     General: Bowel sounds are normal.     Palpations: Abdomen is soft.  Genitourinary:    Rectum: Normal.  Musculoskeletal: Normal range of motion.     Comments: Trace edema.  Skin:    General: Skin is warm and dry.  Neurological:     Mental Status: He is oriented to person, place, and time.  Psychiatric:        Behavior: Behavior normal.        Thought Content: Thought content normal.        Judgment: Judgment normal.         Assessment & Plan    1. Gastroesophageal reflux disease, esophagitis presence not specified Cut PPI back to one daily.  2. Other specified hypothyroidism   3. Benign prostatic hyperplasia without lower urinary tract symptoms   4. Class 1 obesity due to excess calories without serious comorbidity with body mass index (BMI) of 30.0 to 30.9 in adult Pt has successfully lost 14 lbs since last visit due to  lifestyle changes.He is pleased.     Richard Cranford Mon, MD  [facility]

## 2018-11-06 DIAGNOSIS — Z0120 Encounter for dental examination and cleaning without abnormal findings: Secondary | ICD-10-CM | POA: Diagnosis not present

## 2018-11-07 DIAGNOSIS — Z1283 Encounter for screening for malignant neoplasm of skin: Secondary | ICD-10-CM | POA: Diagnosis not present

## 2018-11-07 DIAGNOSIS — D485 Neoplasm of uncertain behavior of skin: Secondary | ICD-10-CM | POA: Diagnosis not present

## 2018-11-07 DIAGNOSIS — L738 Other specified follicular disorders: Secondary | ICD-10-CM | POA: Diagnosis not present

## 2018-11-07 DIAGNOSIS — L820 Inflamed seborrheic keratosis: Secondary | ICD-10-CM | POA: Diagnosis not present

## 2018-11-07 DIAGNOSIS — D179 Benign lipomatous neoplasm, unspecified: Secondary | ICD-10-CM | POA: Diagnosis not present

## 2018-12-11 DIAGNOSIS — K219 Gastro-esophageal reflux disease without esophagitis: Secondary | ICD-10-CM | POA: Diagnosis not present

## 2018-12-11 DIAGNOSIS — Z86010 Personal history of colonic polyps: Secondary | ICD-10-CM | POA: Diagnosis not present

## 2018-12-11 DIAGNOSIS — K760 Fatty (change of) liver, not elsewhere classified: Secondary | ICD-10-CM | POA: Diagnosis not present

## 2019-01-13 DIAGNOSIS — N401 Enlarged prostate with lower urinary tract symptoms: Secondary | ICD-10-CM | POA: Diagnosis not present

## 2019-01-13 DIAGNOSIS — N5201 Erectile dysfunction due to arterial insufficiency: Secondary | ICD-10-CM | POA: Diagnosis not present

## 2019-01-13 DIAGNOSIS — Z125 Encounter for screening for malignant neoplasm of prostate: Secondary | ICD-10-CM | POA: Diagnosis not present

## 2019-02-05 ENCOUNTER — Ambulatory Visit (INDEPENDENT_AMBULATORY_CARE_PROVIDER_SITE_OTHER): Payer: Medicare Other

## 2019-02-05 ENCOUNTER — Other Ambulatory Visit: Payer: Self-pay

## 2019-02-05 DIAGNOSIS — Z23 Encounter for immunization: Secondary | ICD-10-CM | POA: Diagnosis not present

## 2019-02-28 ENCOUNTER — Other Ambulatory Visit: Payer: Self-pay | Admitting: Family Medicine

## 2019-02-28 DIAGNOSIS — K219 Gastro-esophageal reflux disease without esophagitis: Secondary | ICD-10-CM

## 2019-04-22 ENCOUNTER — Encounter: Payer: Self-pay | Admitting: Family Medicine

## 2019-05-15 DIAGNOSIS — Z0120 Encounter for dental examination and cleaning without abnormal findings: Secondary | ICD-10-CM | POA: Diagnosis not present

## 2019-05-21 NOTE — Progress Notes (Signed)
Patient: Daniel [name], Male    DOB: [birth year], 68 y.o.   MRN: [id number] Visit Date: 05/27/2019  Today's Provider: Wilhemena Durie, MD   Chief Complaint  Patient presents with  . Medicare Wellness   Subjective:     Annual wellness visit Daniel [name] is a 68 y.o. male. He feels well. He reports exercising yes. He reports he is sleeping well. He is a widower twice.  He is retired.,  No children.  He is dating a lady regularly.   -----------------------------------------------------------  Colonoscopy: 11/19/2017   Review of Systems  Constitutional: Negative.   HENT: Positive for hearing loss and tinnitus.   Eyes: Negative.   Respiratory: Negative.   Cardiovascular: Negative.   Endocrine: Negative.   Genitourinary: Negative.   Musculoskeletal: Positive for back pain.  Allergic/Immunologic: Negative.   Neurological: Negative.   Hematological: Negative.   Psychiatric/Behavioral: Negative.     Social History   Socioeconomic History  . Marital status: Widowed    Spouse name: Not on file  . Number of children: Not on file  . Years of education: Not on file  . Highest education level: Not on file  Occupational History  . Not on file  Tobacco Use  . Smoking status: Former Smoker    Types: Cigarettes    Quit date: 1991    Years since quitting: 30.0  . Smokeless tobacco: Never Used  . Tobacco comment: quit smoking 25 years ago  Substance and Sexual Activity  . Alcohol use: Yes    Comment: 1-2 glasses of wine 4-5 times a week  . Drug use: No  . Sexual activity: Not on file  Other Topics Concern  . Not on file  Social History Narrative  . Not on file   Social Determinants of Health   Financial Resource Strain:   . Difficulty of Paying Living Expenses: Not on file  Food Insecurity:   . Worried About Charity fundraiser in the Last Year: Not on file  . Ran Out of Food in the Last Year: Not on file  Transportation Needs:   . Lack of  Transportation (Medical): Not on file  . Lack of Transportation (Non-Medical): Not on file  Physical Activity:   . Days of Exercise per Week: Not on file  . Minutes of Exercise per Session: Not on file  Stress:   . Feeling of Stress : Not on file  Social Connections:   . Frequency of Communication with Friends and Family: Not on file  . Frequency of Social Gatherings with Friends and Family: Not on file  . Attends Religious Services: Not on file  . Active Member of Clubs or Organizations: Not on file  . Attends Archivist Meetings: Not on file  . Marital Status: Not on file  Intimate Partner Violence:   . Fear of Current or Ex-Partner: Not on file  . Emotionally Abused: Not on file  . Physically Abused: Not on file  . Sexually Abused: Not on file    Past Medical History:  Diagnosis Date  . Asthma   . Hypothyroidism   . Seasonal allergies   . Thyroid disease      Patient Active Problem List   Diagnosis Date Noted  . Zenker's (hypopharyngeal) diverticulum 03/22/2018  . Gastroesophageal reflux disease 03/22/2018  . Fatty liver 10/09/2017  . BPH (benign prostatic hyperplasia) 11/21/2014  . Hypothyroidism 11/21/2014  . Obesity 11/21/2014  . Asthma 11/21/2014  . Inguinal  hernia 11/21/2014  . OA (osteoarthritis) 11/21/2014  . H/O adenomatous polyp of colon 10/14/2014    Past Surgical History:  Procedure Laterality Date  . CATARACT EXTRACTION    . COLON SURGERY    . COLONOSCOPY WITH PROPOFOL N/A 11/18/2014   Procedure: COLONOSCOPY WITH PROPOFOL;  Surgeon: [name], MD;  Location: [facility] END[facility]COPY;  Service: Endoscopy;  Laterality: N/A;  . EYE SURGERY    . KNEE SURGERY    . NASAL SEPTUM SURGERY      His family history includes Arthritis in his brother; Cervical cancer in his mother; Colon cancer in his mother; Colon polyps in his mother; Heart attack in his brother; Hypertension in his father; Leukemia in his father; Osteoporosis in his mother; Throat  cancer in his maternal grandmother.   Current Outpatient Medications:  .  Famotidine (PEPCID PO), Take by mouth., Disp: , Rfl:  .  Ginkgo Biloba 40 MG TABS, Take by mouth., Disp: , Rfl:  .  levothyroxine (SYNTHROID, LEVOTHROID) 100 MCG tablet, TAKE 1 TABLET BY MOUTH EVERY DAY BEFORE BREAKFAST, Disp: 90 tablet, Rfl: 3 .  Multiple Vitamin (MULTIVITAMIN) capsule, Take 1 capsule daily by mouth., Disp: , Rfl:  .  Omega-3 Fatty Acids (FISH OIL) 1200 MG CPDR, Take by mouth., Disp: , Rfl:  .  pantoprazole (PROTONIX) 40 MG tablet, TAKE 1 TABLET(40 MG) BY MOUTH TWICE DAILY, Disp: 60 tablet, Rfl: 3 .  amoxicillin-clavulanate (AUGMENTIN) 875-125 MG tablet, Take 1 tablet by mouth 2 (two) times daily. (Patient not taking: Reported on 11/04/2018), Disp: 20 tablet, Rfl: 0 .  cefdinir (OMNICEF) 300 MG capsule, Take 1 capsule (300 mg total) by mouth 2 (two) times daily. (Patient not taking: Reported on 05/27/2019), Disp: 14 capsule, Rfl: 0 .  cetirizine (ZYRTEC) 10 MG tablet, Take 1 tablet (10 mg total) by mouth daily. (Patient not taking: Reported on 05/27/2019), Disp: 30 tablet, Rfl: 11 .  HYDROcodone-homatropine (HYCODAN) 5-1.5 MG/5ML syrup, 5 ml 4-6 hours as needed for cough (Patient not taking: Reported on 11/04/2018), Disp: 100 mL, Rfl: 0  Current Facility-Administered Medications:  .  ranitidine (ZANTAC) tablet 150 mg, 150 mg, Oral, BID, Jerrol Banana., MD  Patient Care Team: Jerrol Banana., MD as PCP - General (Family Medicine)    Objective:    Vitals: BP 123/85 (BP Location: Left Arm, Patient Position: Sitting, Cuff Size: Large)   Pulse 78   Temp (!) 97.3 F (36.3 C) (Other (Comment))   Resp 16   Ht 6\' 2"  (1.88 m)   Wt 224 lb (101.6 kg)   SpO2 97%   BMI 28.76 kg/m   Physical Exam Vitals reviewed.  Constitutional:      Appearance: He is well-developed.  HENT:     Head: Normocephalic and atraumatic.     Right Ear: External ear normal.     Left Ear: External ear normal.      Nose: Nose normal.  Eyes:     Conjunctiva/sclera: Conjunctivae normal.     Pupils: Pupils are equal, round, and reactive to light.  Cardiovascular:     Rate and Rhythm: Normal rate and regular rhythm.     Heart sounds: Normal heart sounds.  Pulmonary:     Effort: Pulmonary effort is normal.     Breath sounds: Normal breath sounds.  Abdominal:     General: Bowel sounds are normal.     Palpations: Abdomen is soft.  Genitourinary:    Penis: Normal.      Testes: Normal.  Musculoskeletal:        General: Normal range of motion.     Cervical back: Normal range of motion and neck supple.     Comments: Trace edema.  Skin:    General: Skin is warm and dry.  Neurological:     General: No focal deficit present.     Mental Status: He is oriented to person, place, and time.  Psychiatric:        Mood and Affect: Mood normal.        Behavior: Behavior normal.        Thought Content: Thought content normal.        Judgment: Judgment normal.     Activities of Daily Living In your present state of health, do you have any difficulty performing the following activities: 05/27/2019  Hearing? Y  Vision? N  Difficulty concentrating or making decisions? N  Walking or climbing stairs? N  Dressing or bathing? N  Doing errands, shopping? N  Some recent data might be hidden    Fall Risk Assessment Fall Risk  05/27/2019 04/18/2018 04/11/2017 04/06/2016 01/27/2015  Falls in the past year? 0 0 No No No  Number falls in past yr: 0 - - - -  Injury with Fall? 0 - - - -  Follow up Falls evaluation completed - - - -     Depression Screen PHQ 2/9 Scores 05/27/2019 04/18/2018 04/11/2017 04/06/2016  PHQ - 2 Score 0 0 0 0  PHQ- 9 Score 0 - 0 -    6CIT Screen 05/27/2019  What Year? 0 points  What month? 0 points  What time? 0 points  Count back from 20 0 points  Months in reverse 0 points  Repeat phrase 0 points  Total Score 0      Assessment & Plan:     Annual Wellness Visit  Reviewed  patient's Family Medical History Reviewed and updated list of patient's medical providers Assessment of cognitive impairment was done Assessed patient's functional ability Established a written schedule for health screening [place] Completed and Reviewed  Exercise Activities and Dietary recommendations Goals   None     Immunization History  Administered Date(s) Administered  . Fluad Quad(high Dose 65+) 02/05/2019  . Influenza, High Dose Seasonal PF 02/25/2015, 02/15/2017, 02/13/2018  . Influenza, Seasonal, Injecte, Preservative Fre 06/26/2016  . Pneumococcal Conjugate-13 01/27/2015  . Pneumococcal Polysaccharide-23 04/18/2018  . Tdap 01/23/2007  . Zoster 05/31/2010    Health Maintenance  Topic Date Due  . COLONOSCOPY  11/20/2022  . TETANUS/TDAP  03/10/2024  . INFLUENZA VACCINE  Completed  . Hepatitis C Screening  Completed  . PNA vac Low Risk Adult  Completed     Discussed health benefits of physical activity, and encouraged him to engage in regular exercise appropriate for his age and condition.    --------------------------------------------------------------------  1. Annual physical exam  - POCT urinalysis dipstick - CBC w/Diff/Platelet - Comprehensive Metabolic Panel (CMET) - Lipid panel - TSH  2. Hypothyroidism, unspecified type  - CBC w/Diff/Platelet - Comprehensive Metabolic Panel (CMET) - Lipid panel - TSH  3. Hyperlipidemia, unspecified hyperlipidemia type  - CBC w/Diff/Platelet - Comprehensive Metabolic Panel (CMET) - Lipid panel - TSH  4. Gastroesophageal reflux disease, unspecified whether esophagitis present  - CBC w/Diff/Platelet - Comprehensive Metabolic Panel (CMET) - Lipid panel - TSH 5.BPH/s/p urolift  Follow up in one year for Annual Wellness Visit.    I,Mckenlee Mangham,acting as a scribe for Wilhemena Durie, MD.,have documented  all relevant documentation on the behalf of Wilhemena Durie, MD,as  directed by  Wilhemena Durie, MD while in the presence of Wilhemena Durie, MD.   Wilhemena Durie, MD  [facility]

## 2019-05-27 ENCOUNTER — Telehealth: Payer: Self-pay

## 2019-05-27 ENCOUNTER — Encounter: Payer: Self-pay | Admitting: Family Medicine

## 2019-05-27 ENCOUNTER — Other Ambulatory Visit: Payer: Self-pay

## 2019-05-27 ENCOUNTER — Ambulatory Visit (INDEPENDENT_AMBULATORY_CARE_PROVIDER_SITE_OTHER): Payer: Medicare Other | Admitting: Family Medicine

## 2019-05-27 VITALS — BP 123/85 | HR 78 | Temp 97.3°F | Resp 16 | Ht 74.0 in | Wt 224.0 lb

## 2019-05-27 DIAGNOSIS — K219 Gastro-esophageal reflux disease without esophagitis: Secondary | ICD-10-CM

## 2019-05-27 DIAGNOSIS — E785 Hyperlipidemia, unspecified: Secondary | ICD-10-CM

## 2019-05-27 DIAGNOSIS — Z0000 Encounter for general adult medical examination without abnormal findings: Secondary | ICD-10-CM | POA: Diagnosis not present

## 2019-05-27 DIAGNOSIS — N400 Enlarged prostate without lower urinary tract symptoms: Secondary | ICD-10-CM

## 2019-05-27 DIAGNOSIS — E039 Hypothyroidism, unspecified: Secondary | ICD-10-CM

## 2019-05-27 LAB — POCT URINALYSIS DIPSTICK
Appearance: NORMAL
Bilirubin, UA: NEGATIVE
Glucose, UA: NEGATIVE
Ketones, UA: NEGATIVE
Nitrite, UA: NEGATIVE
Odor: NORMAL
Protein, UA: POSITIVE — AB
Spec Grav, UA: 1.01 (ref 1.010–1.025)
Urobilinogen, UA: 1 E.U./dL
pH, UA: 7.5 (ref 5.0–8.0)

## 2019-05-27 NOTE — Telephone Encounter (Signed)
Please Advise

## 2019-05-27 NOTE — Telephone Encounter (Signed)
Copied from [place] [id number]. Topic: General - Other >> May 27, 2019  2:54 PM Greggory Keen D wrote: Reason for CRM: Pt called saying he was in for a PE today with Dr. [name].  Pt noticed on his report that he had leukocytes in his urine.  He is a little concerned because his father passed away from leukemia.  He would like a nurse to call back at [phone]

## 2019-05-28 LAB — COMPREHENSIVE METABOLIC PANEL
ALT: 16 IU/L (ref 0–44)
AST: 18 IU/L (ref 0–40)
Albumin/Globulin Ratio: 1.7 (ref 1.2–2.2)
Albumin: 4 g/dL (ref 3.8–4.8)
Alkaline Phosphatase: 77 IU/L (ref 39–117)
BUN/Creatinine Ratio: 16 (ref 10–24)
BUN: 13 mg/dL (ref 8–27)
Bilirubin Total: 0.6 mg/dL (ref 0.0–1.2)
CO2: 23 mmol/L (ref 20–29)
Calcium: 8.8 mg/dL (ref 8.6–10.2)
Chloride: 105 mmol/L (ref 96–106)
Creatinine, Ser: 0.81 mg/dL (ref 0.76–1.27)
GFR calc Af Amer: 105 mL/min/{1.73_m2} (ref >59)
GFR calc non Af Amer: 91 mL/min/{1.73_m2} (ref >59)
Globulin, Total: 2.3 g/dL (ref 1.5–4.5)
Glucose: 103 mg/dL — ABNORMAL HIGH (ref 65–99)
Potassium: 3.8 mmol/L (ref 3.5–5.2)
Sodium: 141 mmol/L (ref 134–144)
Total Protein: 6.3 g/dL (ref 6.0–8.5)

## 2019-05-28 LAB — CBC WITH DIFFERENTIAL/PLATELET
Basophils Absolute: 0.1 10*3/uL (ref 0.0–0.2)
Basos: 1 %
EOS (ABSOLUTE): 0.4 10*3/uL (ref 0.0–0.4)
Eos: 6 %
Hematocrit: 46.9 % (ref 37.5–51.0)
Hemoglobin: 15.9 g/dL (ref 13.0–17.7)
Immature Grans (Abs): 0 10*3/uL (ref 0.0–0.1)
Immature Granulocytes: 0 %
Lymphocytes Absolute: 1 10*3/uL (ref 0.7–3.1)
Lymphs: 15 %
MCH: 29.6 pg (ref 26.6–33.0)
MCHC: 33.9 g/dL (ref 31.5–35.7)
MCV: 87 fL (ref 79–97)
Monocytes Absolute: 0.6 10*3/uL (ref 0.1–0.9)
Monocytes: 9 %
Neutrophils Absolute: 4.7 10*3/uL (ref 1.4–7.0)
Neutrophils: 69 %
Platelets: 241 10*3/uL (ref 150–450)
RBC: 5.37 x10E6/uL (ref 4.14–5.80)
RDW: 12.6 % (ref 11.6–15.4)
WBC: 6.8 10*3/uL (ref 3.4–10.8)

## 2019-05-28 LAB — LIPID PANEL
Chol/HDL Ratio: 3 ratio (ref 0.0–5.0)
Cholesterol, Total: 195 mg/dL (ref 100–199)
HDL: 65 mg/dL (ref >39)
LDL Chol Calc (NIH): 118 mg/dL — ABNORMAL HIGH (ref 0–99)
Triglycerides: 66 mg/dL (ref 0–149)
VLDL Cholesterol Cal: 12 mg/dL (ref 5–40)

## 2019-05-28 LAB — TSH: TSH: 2.02 u[IU]/mL (ref 0.450–4.500)

## 2019-05-29 ENCOUNTER — Encounter: Payer: Self-pay | Admitting: *Deleted

## 2019-06-03 DIAGNOSIS — N401 Enlarged prostate with lower urinary tract symptoms: Secondary | ICD-10-CM | POA: Diagnosis not present

## 2019-06-03 DIAGNOSIS — N411 Chronic prostatitis: Secondary | ICD-10-CM | POA: Diagnosis not present

## 2019-07-07 DIAGNOSIS — L578 Other skin changes due to chronic exposure to nonionizing radiation: Secondary | ICD-10-CM | POA: Diagnosis not present

## 2019-07-07 DIAGNOSIS — L309 Dermatitis, unspecified: Secondary | ICD-10-CM | POA: Diagnosis not present

## 2019-07-07 DIAGNOSIS — L821 Other seborrheic keratosis: Secondary | ICD-10-CM | POA: Diagnosis not present

## 2019-07-07 DIAGNOSIS — L820 Inflamed seborrheic keratosis: Secondary | ICD-10-CM | POA: Diagnosis not present

## 2019-08-25 ENCOUNTER — Other Ambulatory Visit: Payer: Self-pay | Admitting: Family Medicine

## 2019-08-25 NOTE — Telephone Encounter (Signed)
Requested medication (s) are due for refill today:yes  Requested medication (s) are on the active medication list:yes  Last refill: 08/12/18  Future visit scheduled: yes  Notes to clinic: Rx expired    Requested Prescriptions  Pending Prescriptions Disp Refills   levothyroxine (SYNTHROID) 100 MCG tablet [Pharmacy Med Name: LEVOTHYROXINE 0.100MG  (100MCG) TAB] 90 tablet 3    Sig: TAKE 1 TABLET BY MOUTH EVERY DAY BEFORE BREAKFAST      Endocrinology:  Hypothyroid Agents Failed - 08/25/2019  8:20 AM      Failed - TSH needs to be rechecked within 3 months after an abnormal result. Refill until TSH is due.      Passed - TSH in normal range and within 360 days    TSH  Date Value Ref Range Status  05/27/2019 2.020 0.450 - 4.500 uIU/mL Final          Passed - Valid encounter within last 12 months    Recent Outpatient Visits           3 months ago Annual physical exam   [facility] Jerrol Banana., MD   9 months ago Gastroesophageal reflux disease, esophagitis presence not specified   [facility] Jerrol Banana., MD   1 year ago Acute bronchitis, unspecified organism   [facility] [place], [place], [place]   1 year ago Acute pansinusitis, recurrence not specified   [facility] Jerrol Banana., MD   1 year ago URI, acute   [facility] [place], [place], [place]       Future Appointments             In 2 months Ralene Bathe, MD [place]

## 2019-10-03 ENCOUNTER — Ambulatory Visit (INDEPENDENT_AMBULATORY_CARE_PROVIDER_SITE_OTHER): Payer: Medicare Other | Admitting: Physician Assistant

## 2019-10-03 ENCOUNTER — Telehealth: Payer: Self-pay

## 2019-10-03 ENCOUNTER — Encounter: Payer: Self-pay | Admitting: Physician Assistant

## 2019-10-03 ENCOUNTER — Other Ambulatory Visit: Payer: Self-pay

## 2019-10-03 VITALS — BP 130/80 | HR 79 | Temp 96.6°F | Wt 218.8 lb

## 2019-10-03 DIAGNOSIS — T162XXA Foreign body in left ear, initial encounter: Secondary | ICD-10-CM

## 2019-10-03 DIAGNOSIS — H903 Sensorineural hearing loss, bilateral: Secondary | ICD-10-CM | POA: Diagnosis not present

## 2019-10-03 NOTE — Patient Instructions (Signed)
Your appointment is at 2:50 PM on 10/03/2019 with Dr. Selena Batten at [facility] ENT. [address] [place], [place] [address]

## 2019-10-03 NOTE — Telephone Encounter (Signed)
Copied from [place] [id number]. Topic: Clinical - COVID Pre-Screen >> Oct 03, 2019  8:40 AM Virl Axe D wrote: 1. To the best of your knowledge, have you been in close contact with anyone with a confirmed diagnosis of COVID 19?  no  If no - Proceed to next question; If yes - Schedule patient for a virtual visit  2. Have you had any one or more of the following: fever, chills, cough, shortness of breath or any flu-like symptoms?  no  If no - Proceed to next question; If yes - Schedule patient for a virtual visit  3. Have you been diagnosed with or have a previous diagnosis of COVID 19?  no  If no - Proceed to next question; If yes - Schedule patient for a virtual visit  4. I am going to go over a few other symptoms with you. Please let me know if you are experiencing any of the following: no  Ear, nose or throat discomfort  A sore throat  Headache  Muscle pain  Diarrhea  Loss of taste or smell  If no - Continue with scheduling process; If yes - Document in scheduling notes   Thank you for answering these questions. Please know we will ask you these questions or similar questions when you arrive for your appointment and again it's how we are keeping everyone safe. Also, to keep you safe, please use the provided hand sanitizer when you enter the building. Daniel [name], we are asking everyone in the building to wear a mask because they help [place] prevent the spread of germs.   Do you have a mask of your own, if not, we are happy to provide one for you. The last thing I want to go over with you is the no visitor guidelines. This means no one can attend the appointment with you unless you need physical assistance. I understand this may be different from your past appointments and I know this may be difficult but please know if someone is driving you we are happy to call them for you once your appointment is over.

## 2019-10-03 NOTE — Progress Notes (Signed)
Established patient visit   Patient: Daniel [name]   DOB: [birth year]   69 y.o. Male  MRN: [id number] Visit Date: 10/03/2019  Today's healthcare provider: Trinna Post, PA-C   Chief Complaint  Patient presents with  . Foreign Body in [place]    HPI Patient reports that he went to place left hearing aids in his ears this morning and noticed the hearing aid tip was missing. Patient states he didn't put anything in his ear and is unaware if his ear is stopped up due to hearing loss. He denies any discharge, pain or discomfort.      Medications: Outpatient Medications Prior to Visit  Medication Sig  . Famotidine (PEPCID PO) Take by mouth.  . Ginkgo Biloba 40 MG TABS Take by mouth.  . levothyroxine (SYNTHROID) 100 MCG tablet TAKE 1 TABLET BY MOUTH EVERY DAY BEFORE BREAKFAST  . Multiple Vitamin (MULTIVITAMIN) capsule Take 1 capsule daily by mouth.  . Omega-3 Fatty Acids (FISH OIL) 1200 MG CPDR Take by mouth.  . pantoprazole (PROTONIX) 40 MG tablet TAKE 1 TABLET(40 MG) BY MOUTH TWICE DAILY  . amoxicillin-clavulanate (AUGMENTIN) 875-125 MG tablet Take 1 tablet by mouth 2 (two) times daily. (Patient not taking: Reported on 11/04/2018)  . cefdinir (OMNICEF) 300 MG capsule Take 1 capsule (300 mg total) by mouth 2 (two) times daily. (Patient not taking: Reported on 05/27/2019)  . cetirizine (ZYRTEC) 10 MG tablet Take 1 tablet (10 mg total) by mouth daily. (Patient not taking: Reported on 05/27/2019)  . HYDROcodone-homatropine (HYCODAN) 5-1.5 MG/5ML syrup 5 ml 4-6 hours as needed for cough (Patient not taking: Reported on 10/03/2019)   Facility-Administered Medications Prior to Visit  Medication Dose Route Frequency Provider  . ranitidine (ZANTAC) tablet 150 mg  150 mg Oral BID Jerrol Banana., MD    Review of Systems  Constitutional: Negative.   Respiratory: Negative.   Cardiovascular: Negative.   Hematological: Negative.       Objective    BP 130/80 (BP  Location: Left Arm, Patient Position: Sitting, Cuff Size: Normal)   Pulse 79   Temp (!) 96.6 F (35.9 C) (Temporal)   Wt 218 lb 12.8 oz (99.2 kg)   SpO2 96%   BMI 28.09 kg/m    Physical Exam Constitutional:      Appearance: Normal appearance.  HENT:     Ears:     Comments: Black hearing aid tip visible deep in the left ear, partially obstructing TM.  Skin:    General: Skin is warm and dry.  Neurological:     General: No focal deficit present.     Mental Status: He is alert and oriented to person, place, and time.  Psychiatric:        Mood and Affect: Mood normal.        Behavior: Behavior normal.       No results found for any visits on 10/03/19.  Assessment & Plan    1. Foreign body in auricle of left ear, initial encounter  Patient had to be referred out to ENT for removal. He has appointment for 2:50 PM with Dr. Selena Batten at [facility].      Return if symptoms worsen or fail to improve.      ITrinna Post, PA-C, have reviewed all documentation for this visit. The documentation on 10/03/19 for the exam, diagnosis, procedures, and orders are all accurate and complete.    Trinna Post, PA-C  [place] [phone] (phone) [phone] (fax)  [place]

## 2019-11-05 ENCOUNTER — Ambulatory Visit: Payer: Medicare Other | Admitting: Dermatology

## 2019-11-05 ENCOUNTER — Other Ambulatory Visit: Payer: Self-pay

## 2019-11-05 DIAGNOSIS — L821 Other seborrheic keratosis: Secondary | ICD-10-CM

## 2019-11-05 DIAGNOSIS — L570 Actinic keratosis: Secondary | ICD-10-CM

## 2019-11-05 DIAGNOSIS — L820 Inflamed seborrheic keratosis: Secondary | ICD-10-CM

## 2019-11-05 DIAGNOSIS — L578 Other skin changes due to chronic exposure to nonionizing radiation: Secondary | ICD-10-CM

## 2019-11-05 NOTE — Progress Notes (Signed)
   Follow-Up Visit   Subjective  Daniel [name] is a 69 y.o. male who presents for the following: Actinic Keratosis (face, 52m f/u LN2 and Carac cream) and check spots (face, irritating, present ~58m).   The following portions of the chart were reviewed this encounter and updated as appropriate:  Tobacco  Allergies  Meds  Problems  Med Hx  Surg Hx  Fam Hx      Review of Systems:  No other skin or systemic complaints except as noted in HPI or Assessment and Plan.  Objective  Well appearing patient in no apparent distress; mood and affect are within normal limits.  A focused examination was performed including face. Relevant physical exam findings are noted in the Assessment and Plan.  Objective  Right zygoma: Pinkness R cheek  Objective  L temple/scalp x 3, R cheek x 2 (5): Erythematous keratotic or waxy stuck-on papule or plaque.    Assessment & Plan    Actinic Damage - diffuse scaly erythematous macules with underlying dyspigmentation - Recommend daily broad spectrum sunscreen SPF 30+ to sun-exposed areas, reapply every 2 hours as needed.  - Call for new or changing lesions.  Seborrheic Keratoses - Stuck-on, waxy, tan-brown papules and plaques  - Discussed benign etiology and prognosis. - Observe - Call for any changes  AK (actinic keratosis) Right zygoma  Hx of Aks, bx proven 07/24/2008, and treatment with Carac cream  Restart Carac cream bid x 3 weeks  Inflamed seborrheic keratosis (5) L temple/scalp x 3, R cheek x 2  Destruction of lesion - L temple/scalp x 3, R cheek x 2 Complexity: simple   Destruction method: cryotherapy   Informed consent: discussed and consent obtained   Timeout:  patient name, date of birth, surgical site, and procedure verified Lesion destroyed using liquid nitrogen: Yes   Region frozen until ice ball extended beyond lesion: Yes   Outcome: patient tolerated procedure well with no complications   Post-procedure details: wound  care instructions given    Return in about 6 months (around 05/06/2020) for TBSE and recheck AK.   I, Othelia Pulling, RMA, am acting as scribe for Sarina Ser, MD .  Documentation: I have reviewed the above documentation for accuracy and completeness, and I agree with the above.  Sarina Ser, MD

## 2019-11-05 NOTE — Patient Instructions (Signed)
Carac Cream Apply a thin coat to right cheek twice a day for 3 weeks.

## 2019-11-11 ENCOUNTER — Encounter: Payer: Self-pay | Admitting: Dermatology

## 2019-11-20 DIAGNOSIS — Z0120 Encounter for dental examination and cleaning without abnormal findings: Secondary | ICD-10-CM | POA: Diagnosis not present

## 2019-12-11 DIAGNOSIS — K760 Fatty (change of) liver, not elsewhere classified: Secondary | ICD-10-CM | POA: Diagnosis not present

## 2019-12-11 DIAGNOSIS — K219 Gastro-esophageal reflux disease without esophagitis: Secondary | ICD-10-CM | POA: Diagnosis not present

## 2019-12-11 DIAGNOSIS — K7400 Hepatic fibrosis, unspecified: Secondary | ICD-10-CM | POA: Diagnosis not present

## 2019-12-25 IMAGING — US US ABDOMEN LIMITED
1 series · 14 of 25 positions shown · non-contrast
Comparison: Ultrasound 12/20/2017

CLINICAL DATA: Fatty liver

EXAM:
ULTRASOUND ABDOMEN LIMITED RIGHT UPPER QUADRANT

[Series 1: us abdomen limited · 0.25mm/px · 14 of 35 slices shown]
[im 1/35]
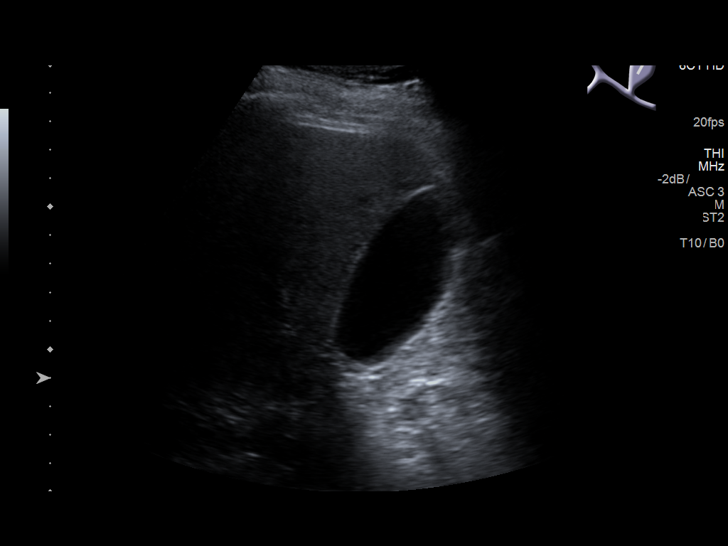
[im 3/35]
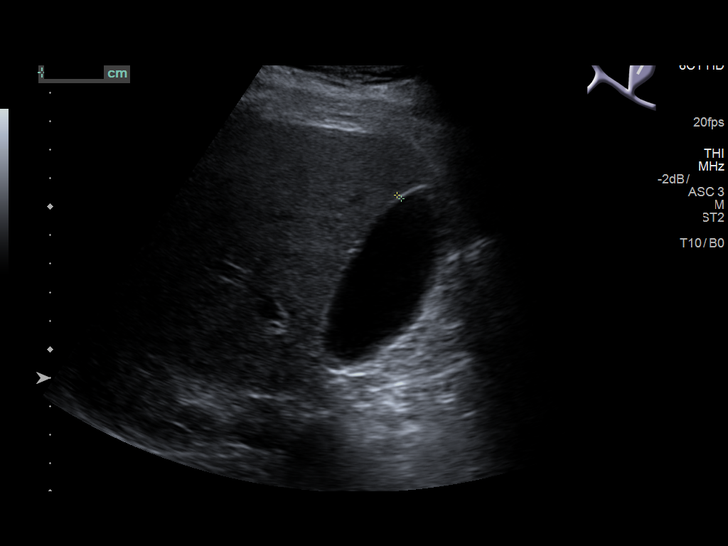
[im 6/35]
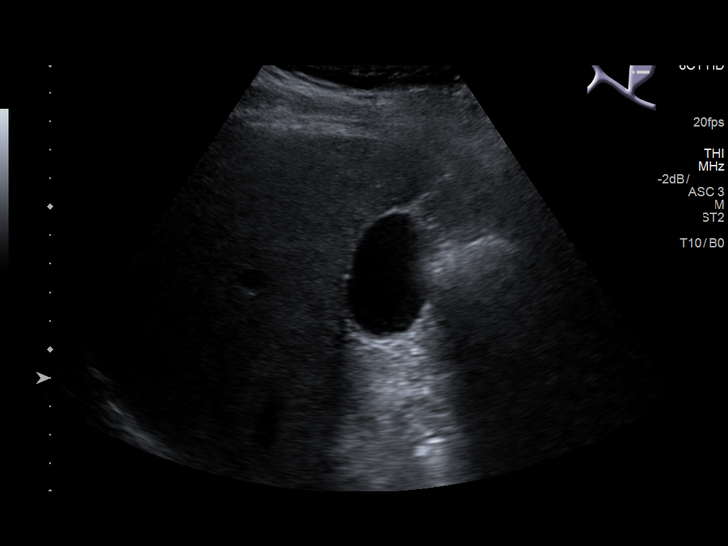
[im 9/35]
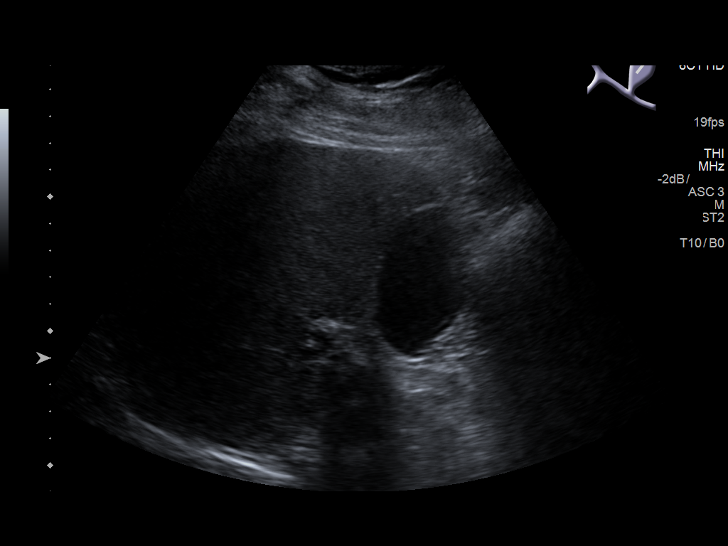
[im 12/35]
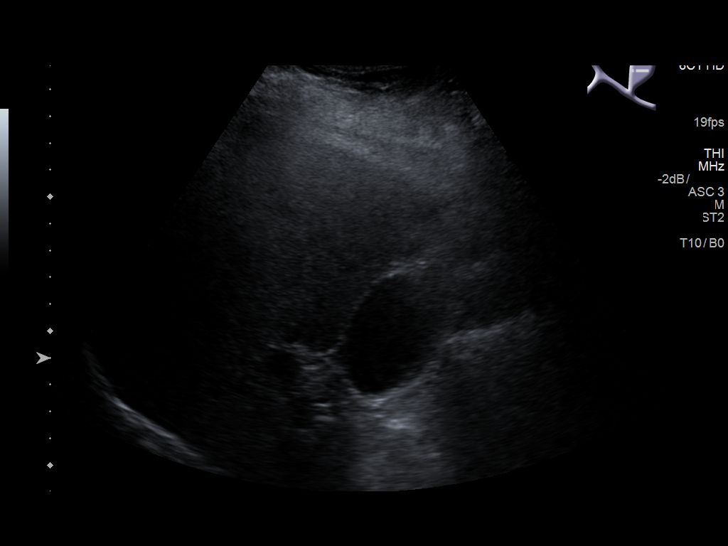
[im 13/35]
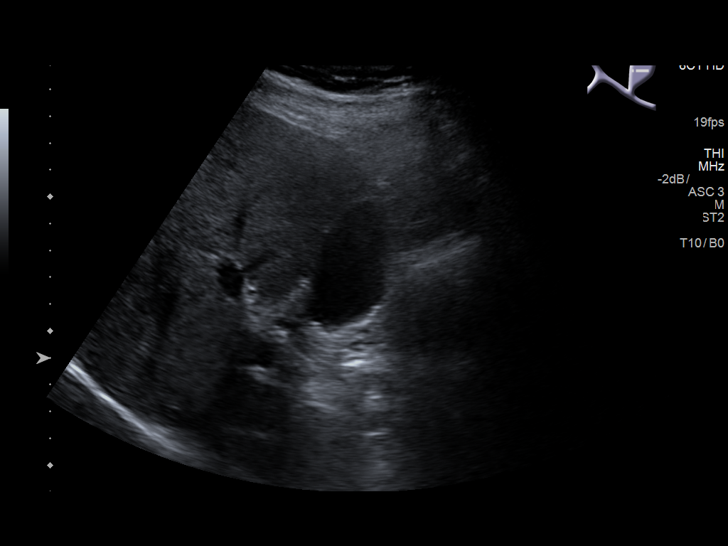
[im 16/35]
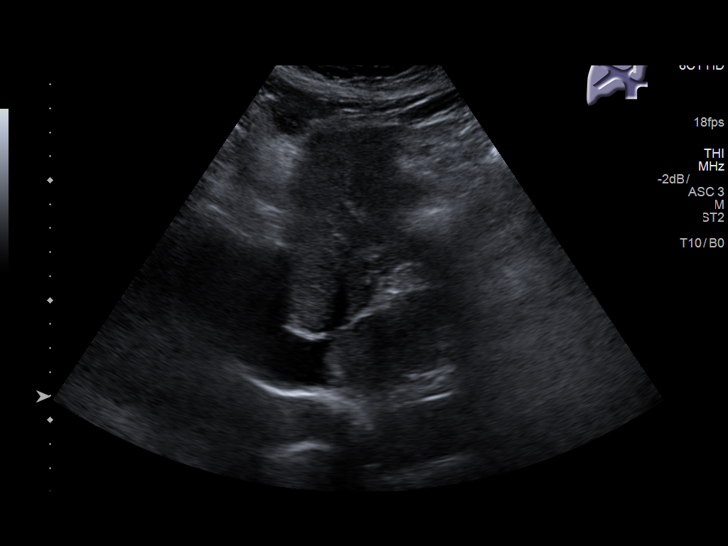
[im 19/35]
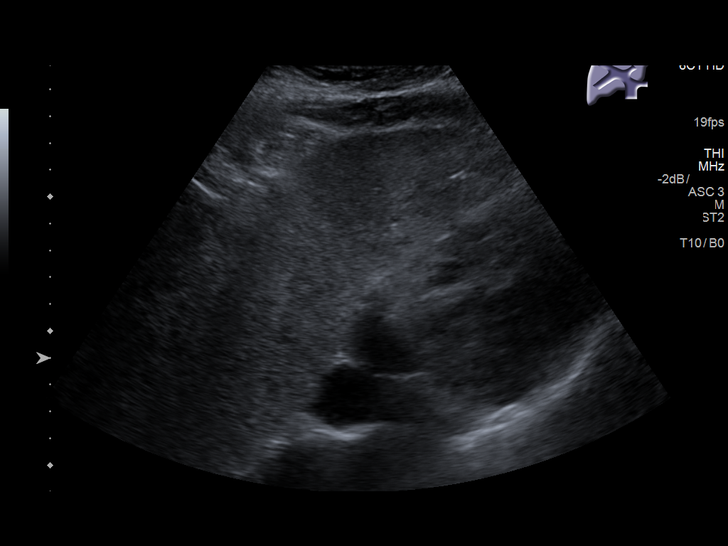
[im 22/35]
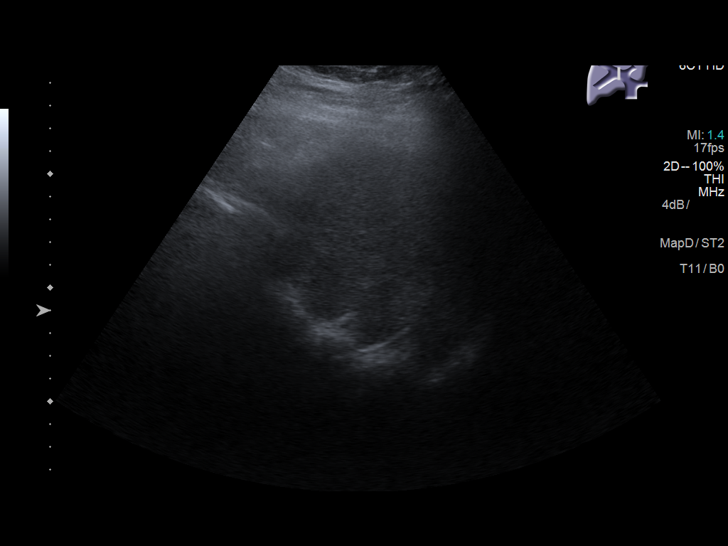
[im 23/35]
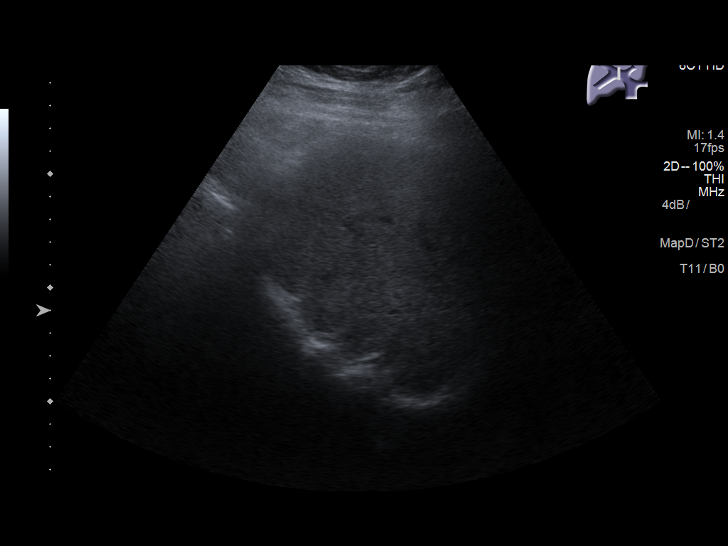
[im 26/35]
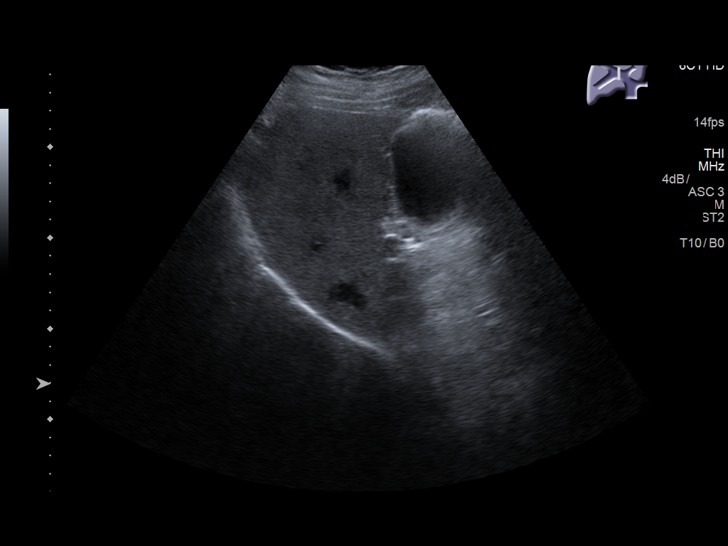
[im 29/35]
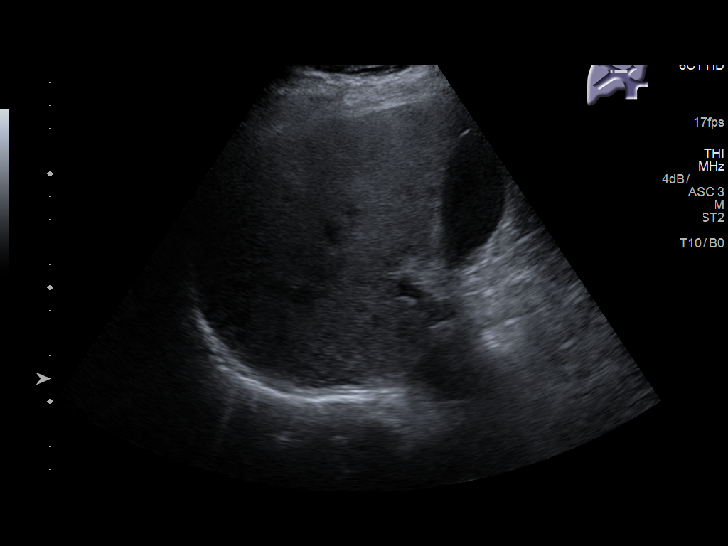
[im 32/35]
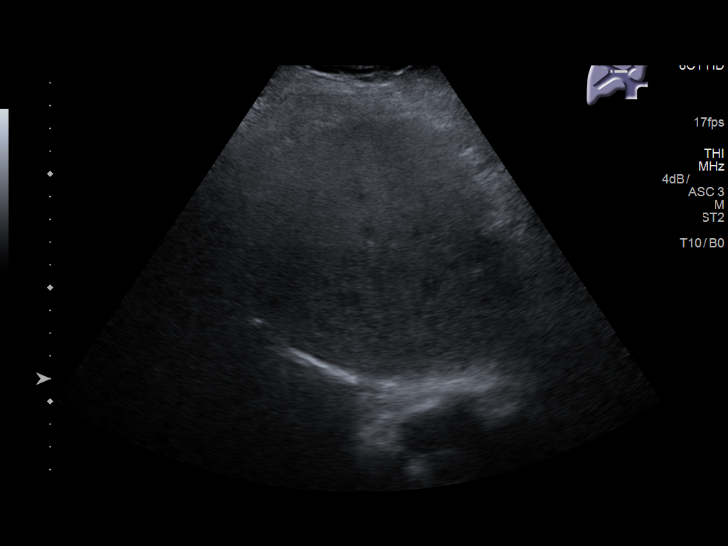
[im 35/35]
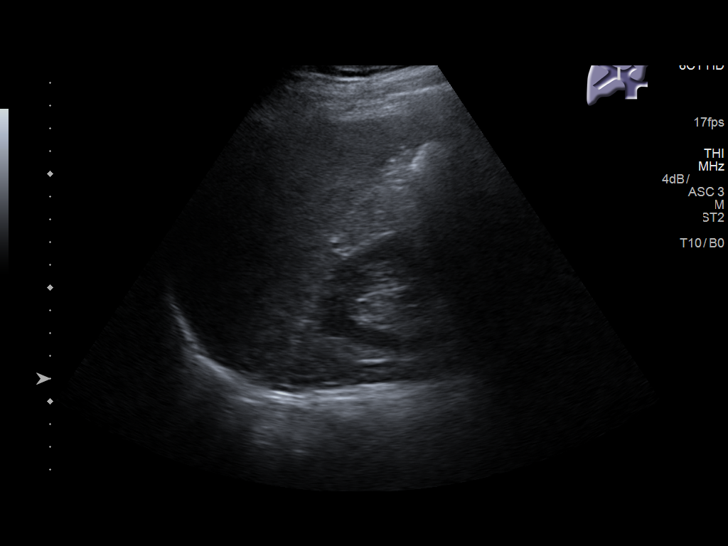

[14 of 25 positions shown; findings below may reference images not displayed]

FINDINGS: Gallbladder:

No gallstones or wall thickening visualized. No sonographic Murphy
sign noted by sonographer.

Common bile duct:

Diameter: 3.7 mm

Liver:

Diffusely increased liver echogenicity without focal lesion. Portal
vein is patent on color Doppler imaging with normal direction of
blood flow towards the liver.
IMPRESSION: Negative for gallstones

Hepatic steatosis

## 2020-01-13 DIAGNOSIS — N5201 Erectile dysfunction due to arterial insufficiency: Secondary | ICD-10-CM | POA: Diagnosis not present

## 2020-01-13 DIAGNOSIS — Z125 Encounter for screening for malignant neoplasm of prostate: Secondary | ICD-10-CM | POA: Diagnosis not present

## 2020-01-13 DIAGNOSIS — N401 Enlarged prostate with lower urinary tract symptoms: Secondary | ICD-10-CM | POA: Diagnosis not present

## 2020-01-19 ENCOUNTER — Other Ambulatory Visit: Payer: Self-pay | Admitting: Student

## 2020-01-19 DIAGNOSIS — K760 Fatty (change of) liver, not elsewhere classified: Secondary | ICD-10-CM

## 2020-01-19 DIAGNOSIS — K7400 Hepatic fibrosis, unspecified: Secondary | ICD-10-CM

## 2020-01-21 DIAGNOSIS — T161XXA Foreign body in right ear, initial encounter: Secondary | ICD-10-CM | POA: Diagnosis not present

## 2020-02-10 ENCOUNTER — Other Ambulatory Visit: Payer: Self-pay | Admitting: Family Medicine

## 2020-02-10 DIAGNOSIS — K219 Gastro-esophageal reflux disease without esophagitis: Secondary | ICD-10-CM

## 2020-02-10 NOTE — Telephone Encounter (Signed)
Requested Prescriptions  Pending Prescriptions Disp Refills   pantoprazole (PROTONIX) 40 MG tablet [Pharmacy Med Name: PANTOPRAZOLE 40MG  TABLETS] 180 tablet 3    Sig: TAKE 1 TABLET(40 MG) BY MOUTH TWICE DAILY     Gastroenterology: Proton Pump Inhibitors Passed - 02/10/2020  1:38 PM      Passed - Valid encounter within last 12 months    Recent Outpatient Visits          4 months ago Foreign body in auricle of left ear, initial encounter   [facility] Carles Collet M, PA-C   8 months ago Annual physical exam   [facility] Jerrol Banana., MD   1 year ago Gastroesophageal reflux disease, esophagitis presence not specified   [facility] Jerrol Banana., MD   1 year ago Acute bronchitis, unspecified organism   [facility] [place], [place], [place]   1 year ago Acute pansinusitis, recurrence not specified   [facility] Jerrol Banana., MD

## 2020-02-18 ENCOUNTER — Ambulatory Visit (INDEPENDENT_AMBULATORY_CARE_PROVIDER_SITE_OTHER): Payer: Medicare Other

## 2020-02-18 ENCOUNTER — Other Ambulatory Visit: Payer: Self-pay

## 2020-02-18 DIAGNOSIS — Z23 Encounter for immunization: Secondary | ICD-10-CM

## 2020-03-03 ENCOUNTER — Other Ambulatory Visit: Payer: Self-pay

## 2020-03-03 ENCOUNTER — Ambulatory Visit
Admission: RE | Admit: 2020-03-03 | Discharge: 2020-03-03 | Disposition: A | Discharge status: Home / Self Care | Payer: Medicare Other | Source: Ambulatory Visit | Attending: Student | Admitting: Student

## 2020-03-03 DIAGNOSIS — K7400 Hepatic fibrosis, unspecified: Secondary | ICD-10-CM | POA: Diagnosis not present

## 2020-03-03 DIAGNOSIS — K7689 Other specified diseases of liver: Secondary | ICD-10-CM | POA: Diagnosis not present

## 2020-03-03 DIAGNOSIS — K760 Fatty (change of) liver, not elsewhere classified: Secondary | ICD-10-CM | POA: Diagnosis not present

## 2020-03-17 ENCOUNTER — Ambulatory Visit: Payer: Medicare Other | Admitting: Family Medicine

## 2020-03-30 ENCOUNTER — Ambulatory Visit: Payer: Medicare Other | Admitting: Family Medicine

## 2020-04-06 ENCOUNTER — Ambulatory Visit (INDEPENDENT_AMBULATORY_CARE_PROVIDER_SITE_OTHER): Payer: Medicare Other | Admitting: Family Medicine

## 2020-04-06 ENCOUNTER — Other Ambulatory Visit: Payer: Self-pay

## 2020-04-06 DIAGNOSIS — Z23 Encounter for immunization: Secondary | ICD-10-CM | POA: Diagnosis not present

## 2020-04-10 NOTE — Progress Notes (Signed)
Vaccine only

## 2020-05-06 ENCOUNTER — Ambulatory Visit: Payer: Medicare Other | Admitting: Dermatology

## 2020-05-06 ENCOUNTER — Encounter: Payer: Self-pay | Admitting: Dermatology

## 2020-05-06 ENCOUNTER — Other Ambulatory Visit: Payer: Self-pay

## 2020-05-06 DIAGNOSIS — Z1283 Encounter for screening for malignant neoplasm of skin: Secondary | ICD-10-CM | POA: Diagnosis not present

## 2020-05-06 DIAGNOSIS — L821 Other seborrheic keratosis: Secondary | ICD-10-CM | POA: Diagnosis not present

## 2020-05-06 DIAGNOSIS — D171 Benign lipomatous neoplasm of skin and subcutaneous tissue of trunk: Secondary | ICD-10-CM | POA: Diagnosis not present

## 2020-05-06 DIAGNOSIS — L820 Inflamed seborrheic keratosis: Secondary | ICD-10-CM

## 2020-05-06 DIAGNOSIS — D1800 Hemangioma unspecified site: Secondary | ICD-10-CM

## 2020-05-06 DIAGNOSIS — L814 Other melanin hyperpigmentation: Secondary | ICD-10-CM | POA: Diagnosis not present

## 2020-05-06 DIAGNOSIS — Z86018 Personal history of other benign neoplasm: Secondary | ICD-10-CM

## 2020-05-06 DIAGNOSIS — D229 Melanocytic nevi, unspecified: Secondary | ICD-10-CM

## 2020-05-06 DIAGNOSIS — L578 Other skin changes due to chronic exposure to nonionizing radiation: Secondary | ICD-10-CM

## 2020-05-06 NOTE — Progress Notes (Signed)
   Follow-Up Visit   Subjective  Daniel [name] is a 69 y.o. male who presents for the following: Annual Exam (History of dysplastic nevus of low back (2008) and AKs) and Other (Scaly spot of left cheek). The patient presents for Total-Body Skin Exam (TBSE) for skin cancer screening and mole check.  The following portions of the chart were reviewed this encounter and updated as appropriate:   Tobacco  Allergies  Meds  Problems  Med Hx  Surg Hx  Fam Hx     Review of Systems:  No other skin or systemic complaints except as noted in HPI or Assessment and Plan.  Objective  Well appearing patient in no apparent distress; mood and affect are within normal limits.  A full examination was performed including scalp, head, eyes, ears, nose, lips, neck, chest, axillae, abdomen, back, buttocks, bilateral upper extremities, bilateral lower extremities, hands, feet, fingers, toes, fingernails, and toenails. All findings within normal limits unless otherwise noted below.  Objective  Face x 3, back x 15 (18): Erythematous keratotic or waxy stuck-on papule or plaque.   Objective  Right mid back paraspinal: 4.0 cm rubbery nodule   Assessment & Plan    Lentigines - Scattered tan macules - Discussed due to sun exposure - Benign, observe - Call for any changes  Seborrheic Keratoses - Stuck-on, waxy, tan-brown papules and plaques  - Discussed benign etiology and prognosis. - Observe - Call for any changes  Melanocytic Nevi - Tan-brown and/or pink-flesh-colored symmetric macules and papules - Benign appearing on exam today - Observation - Call clinic for new or changing moles - Recommend daily use of broad spectrum spf 30+ sunscreen to sun-exposed areas.   Hemangiomas - Red papules - Discussed benign nature - Observe - Call for any changes  Actinic Damage - Chronic, secondary to cumulative UV/sun exposure - diffuse scaly erythematous macules with underlying dyspigmentation -  Recommend daily broad spectrum sunscreen SPF 30+ to sun-exposed areas, reapply every 2 hours as needed.  - Call for new or changing lesions.  Skin cancer screening performed today.  History of Dysplastic Nevi - No evidence of recurrence today - Recommend regular full body skin exams - Recommend daily broad spectrum sunscreen SPF 30+ to sun-exposed areas, reapply every 2 hours as needed.  - Call if any new or changing lesions are noted between office visits  Inflamed seborrheic keratosis (18) Face x 3, back x 15  Destruction of lesion - Face x 3, back x 15 Complexity: simple   Destruction method: cryotherapy   Informed consent: discussed and consent obtained   Timeout:  patient name, date of birth, surgical site, and procedure verified Lesion destroyed using liquid nitrogen: Yes   Region frozen until ice ball extended beyond lesion: Yes   Outcome: patient tolerated procedure well with no complications   Post-procedure details: wound care instructions given    Lipoma of torso Right mid back paraspinal  Benign, observe.   Discussed excision - patient declines today.  Return in about 1 year (around 05/06/2021) for and surgery for lipoma of back.   I, Ashok Cordia, CMA, am acting as scribe for Sarina Ser, MD .  Documentation: I have reviewed the above documentation for accuracy and completeness, and I agree with the above.  Sarina Ser, MD

## 2020-05-06 NOTE — Patient Instructions (Signed)

## 2020-05-12 ENCOUNTER — Encounter: Payer: Self-pay | Admitting: Dermatology

## 2020-05-14 DIAGNOSIS — N411 Chronic prostatitis: Secondary | ICD-10-CM | POA: Diagnosis not present

## 2020-05-14 DIAGNOSIS — N400 Enlarged prostate without lower urinary tract symptoms: Secondary | ICD-10-CM | POA: Diagnosis not present

## 2020-05-27 ENCOUNTER — Ambulatory Visit (INDEPENDENT_AMBULATORY_CARE_PROVIDER_SITE_OTHER): Payer: Medicare Other | Admitting: Family Medicine

## 2020-05-27 ENCOUNTER — Encounter: Payer: Self-pay | Admitting: Family Medicine

## 2020-05-27 ENCOUNTER — Other Ambulatory Visit: Payer: Self-pay

## 2020-05-27 VITALS — BP 123/93 | HR 71 | Temp 98.0°F | Ht 74.0 in | Wt 217.2 lb

## 2020-05-27 DIAGNOSIS — K760 Fatty (change of) liver, not elsewhere classified: Secondary | ICD-10-CM

## 2020-05-27 DIAGNOSIS — K219 Gastro-esophageal reflux disease without esophagitis: Secondary | ICD-10-CM | POA: Diagnosis not present

## 2020-05-27 DIAGNOSIS — Z0000 Encounter for general adult medical examination without abnormal findings: Secondary | ICD-10-CM

## 2020-05-27 DIAGNOSIS — E038 Other specified hypothyroidism: Secondary | ICD-10-CM

## 2020-05-27 DIAGNOSIS — E785 Hyperlipidemia, unspecified: Secondary | ICD-10-CM

## 2020-05-27 DIAGNOSIS — R030 Elevated blood-pressure reading, without diagnosis of hypertension: Secondary | ICD-10-CM

## 2020-05-27 NOTE — Progress Notes (Signed)
Complete physical exam   Patient: Daniel [name]   DOB: 1951/04/28   69 y.o. Male  MRN: [id number] Visit Date: 05/27/2020  Today's healthcare provider: Dortha Kern, PA-C   No chief complaint on file.  Subjective    Daniel [name] is a 69 y.o. male who presents today for a complete physical exam.  He reports consuming a general and slighty more salty (Xmas) diet. Not exercising. He generally feels well. He reports sleeping well. He does not have additional problems to discuss today.  HPI    Past Medical History:  Diagnosis Date  . Actinic keratosis   . Asthma   . Eczema   . Hx of dysplastic nevus 06/06/2006   Mid lower back, slight atypia  . Hypothyroidism   . Seasonal allergies   . Thyroid disease    Past Surgical History:  Procedure Laterality Date  . CATARACT EXTRACTION    . COLON SURGERY    . COLONOSCOPY WITH PROPOFOL N/A 11/18/2014   Procedure: COLONOSCOPY WITH PROPOFOL;  Surgeon: Scot Jun, MD;  Location: [facility] ENDOSCOPY;  Service: Endoscopy;  Laterality: N/A;  . EYE SURGERY    . KNEE SURGERY    . NASAL SEPTUM SURGERY     Social History   Socioeconomic History  . Marital status: Widowed    Spouse name: Not on file  . Number of children: Not on file  . Years of education: Not on file  . Highest education level: Not on file  Occupational History  . Not on file  Tobacco Use  . Smoking status: Former Smoker    Types: Cigarettes    Quit date: 1991    Years since quitting: 31.0  . Smokeless tobacco: Never Used  . Tobacco comment: quit smoking 25 years ago  Vaping Use  . Vaping Use: Never used  Substance and Sexual Activity  . Alcohol use: Yes    Comment: 1-2 glasses of wine 4-5 times a week  . Drug use: No  . Sexual activity: Not on file  Other Topics Concern  . Not on file  Social History Narrative  . Not on file   Social Determinants of Health   Financial Resource Strain: Not on file  Food Insecurity: Not on file  Transportation  Needs: Not on file  Physical Activity: Not on file  Stress: Not on file  Social Connections: Not on file  Intimate Partner Violence: Not on file   Family Status  Relation Name Status  . Mother  Deceased at age 62  . Father  Deceased at age 52  . Brother  Deceased at age 58  . MGM  (Not Specified)   Family History  Problem Relation Age of Onset  . Colon cancer Mother   . Cervical cancer Mother   . Colon polyps Mother   . Osteoporosis Mother   . Hypertension Father   . Leukemia Father   . Arthritis Brother   . Heart attack Brother   . Throat cancer Maternal Grandmother    Allergies  Allergen Reactions  . Ciprofloxacin Hcl Other (See Comments)    Blisters break out  . Levaquin [Levofloxacin In D5w]   . Sulfa Antibiotics Other (See Comments)    unknown    Patient Care Team: Maple Hudson., MD as PCP - General (Family Medicine)   Medications: Outpatient Medications Prior to Visit  Medication Sig  . amoxicillin-clavulanate (AUGMENTIN) 875-125 MG tablet Take 1 tablet by mouth 2 (two) times  daily. (Patient not taking: Reported on 11/04/2018)  . cefdinir (OMNICEF) 300 MG capsule Take 1 capsule (300 mg total) by mouth 2 (two) times daily. (Patient not taking: Reported on 05/27/2019)  . cetirizine (ZYRTEC) 10 MG tablet Take 1 tablet (10 mg total) by mouth daily. (Patient not taking: Reported on 05/27/2019)  . Famotidine (PEPCID PO) Take by mouth.  . Ginkgo Biloba 40 MG TABS Take by mouth.  Marland Kitchen HYDROcodone-homatropine (HYCODAN) 5-1.5 MG/5ML syrup 5 ml 4-6 hours as needed for cough (Patient not taking: Reported on 10/03/2019)  . levothyroxine (SYNTHROID) 100 MCG tablet TAKE 1 TABLET BY MOUTH EVERY DAY BEFORE BREAKFAST  . Multiple Vitamin (MULTIVITAMIN) capsule Take 1 capsule daily by mouth.  . Omega-3 Fatty Acids (FISH OIL) 1200 MG CPDR Take by mouth.  . pantoprazole (PROTONIX) 40 MG tablet TAKE 1 TABLET(40 MG) BY MOUTH TWICE DAILY   Facility-Administered Medications Prior to  Visit  Medication Dose Route Frequency Provider  . ranitidine (ZANTAC) tablet 150 mg  150 mg Oral BID Jerrol Banana., MD    Review of Systems  Constitutional: Negative.   HENT: Negative.   Eyes: Negative.   Respiratory: Negative.   Cardiovascular: Negative.   Gastrointestinal: Negative.   Endocrine: Negative.   Genitourinary: Negative.   Musculoskeletal: Negative.   Skin: Negative.   Allergic/Immunologic: Negative.   Neurological: Negative.   Hematological: Negative.   Psychiatric/Behavioral: Negative.       Objective    BP (!) 123/93 (BP Location: Right Arm, Patient Position: Sitting, Cuff Size: Large)   Pulse 71   Temp 98 F (36.7 C) (Oral)   Ht 6\' 2"  (1.88 m)   Wt 217 lb 3.2 oz (98.5 kg)   SpO2 99%   BMI 27.89 kg/m  BP Readings from Last 3 Encounters:  05/27/20 (!) 123/93  10/03/19 130/80  05/27/19 123/85   Wt Readings from Last 3 Encounters:  05/27/20 217 lb 3.2 oz (98.5 kg)  10/03/19 218 lb 12.8 oz (99.2 kg)  05/27/19 224 lb (101.6 kg)    Physical Exam    Last depression screening scores PHQ 2/9 Scores 05/27/2019 04/18/2018 04/11/2017  PHQ - 2 Score 0 0 0  PHQ- 9 Score 0 - 0   Last fall risk screening Fall Risk  05/27/2019  Falls in the past year? 0  Number falls in past yr: 0  Injury with Fall? 0  Follow up Falls evaluation completed   Last Audit-C alcohol use screening Alcohol Use Disorder Test (AUDIT) 05/27/2019  1. How often do you have a drink containing alcohol? 3  2. How many drinks containing alcohol do you have on a typical day when you are drinking? 0  3. How often do you have six or more drinks on one occasion? 1  AUDIT-C Score 4   A score of 3 or more in women, and 4 or more in men indicates increased risk for alcohol abuse, EXCEPT if all of the points are from question 1   No results found for any visits on 05/27/20.  Assessment & Plan    Routine Health Maintenance and Physical Exam  Exercise Activities and Dietary  recommendations Goals   None     Immunization History  Administered Date(s) Administered  . Fluad Quad(high Dose 65+) 02/05/2019, 02/18/2020  . Influenza, High Dose Seasonal PF 02/25/2015, 02/15/2017, 02/13/2018  . Influenza, Seasonal, Injecte, Preservative Fre 06/26/2016  . PFIZER SARS-COV-2 Vaccination 07/09/2019, 07/30/2019, 03/23/2020  . Pneumococcal Conjugate-13 01/27/2015  . Pneumococcal Polysaccharide-23 04/18/2018  .  Td 04/06/2020  . Tdap 01/23/2007  . Zoster 05/31/2010  . Zoster Recombinat (Shingrix) 06/12/2018, 01/29/2020    Health Maintenance  Topic Date Due  . COVID-19 Vaccine (4 - Booster for Pfizer series) 09/21/2020  . COLONOSCOPY (Pts 45-30yrs Insurance coverage will need to be confirmed)  11/20/2022  . TETANUS/TDAP  04/06/2030  . INFLUENZA VACCINE  Completed  . Hepatitis C Screening  Completed  . PNA vac Low Risk Adult  Completed    Discussed health benefits of physical activity, and encouraged him to engage in regular exercise appropriate for his age and condition.  1. Annual physical exam General health stable. Some ache in lower back from arthritis. Given anticipatory counseling. Recheck routine labs. Prostate exam and PSA by urologist every 6 months since having Urolift surgery. Has had COVID vaccinations with booster. - CBC with Differential/Platelet - Comprehensive metabolic panel - Lipid panel - TSH  2. Other specified hypothyroidism Tolerating Levothyroxine 100 mcg qd. Recheck labs. No nodules or goiter on exam. No significant heat or cold intolerance. - CBC with Differential/Platelet - Comprehensive metabolic panel - TSH - T4  3. Gastroesophageal reflux disease without esophagitis Well controlled with Protonix. Limit caffeine consumption. No melena or hematemesis. - CBC with Differential/Platelet  4. Fatty liver Recheck labs. - Comprehensive metabolic panel  5. Hyperlipidemia, unspecified hyperlipidemia type Still taking Omega-3 Fish  Oil. Watch diet and get back to walking exercise. Recheck labs. - Comprehensive metabolic panel - Lipid panel - TSH  6. Elevated BP without diagnosis of hypertension Elevated BP to 123/93. Admits to extra salty snacks during the holiday. Recommend DASH diet and regular exercise. Monitor BP at home and all report of readings in a couple weeks.   No follow-ups on file.     I, Kane Kusek, PA-C, have reviewed all documentation for this visit. The documentation on 05/27/20 for the exam, diagnosis, procedures, and orders are all accurate and complete.    Vernie Murders, PA-C  [employer] [phone] (phone) [phone] (fax)  [place]

## 2020-05-28 LAB — COMPREHENSIVE METABOLIC PANEL
ALT: 16 IU/L (ref 0–44)
AST: 19 IU/L (ref 0–40)
Albumin/Globulin Ratio: 1.8 (ref 1.2–2.2)
Albumin: 4.2 g/dL (ref 3.8–4.8)
Alkaline Phosphatase: 71 IU/L (ref 44–121)
BUN/Creatinine Ratio: 10 (ref 10–24)
BUN: 10 mg/dL (ref 8–27)
Bilirubin Total: 0.9 mg/dL (ref 0.0–1.2)
CO2: 23 mmol/L (ref 20–29)
Calcium: 9.2 mg/dL (ref 8.6–10.2)
Chloride: 103 mmol/L (ref 96–106)
Creatinine, Ser: 1 mg/dL (ref 0.76–1.27)
GFR calc Af Amer: 88 mL/min/{1.73_m2} (ref >59)
GFR calc non Af Amer: 76 mL/min/{1.73_m2} (ref >59)
Globulin, Total: 2.4 g/dL (ref 1.5–4.5)
Glucose: 98 mg/dL (ref 65–99)
Potassium: 4.3 mmol/L (ref 3.5–5.2)
Sodium: 140 mmol/L (ref 134–144)
Total Protein: 6.6 g/dL (ref 6.0–8.5)

## 2020-05-28 LAB — LIPID PANEL
Chol/HDL Ratio: 3.2 ratio (ref 0.0–5.0)
Cholesterol, Total: 216 mg/dL — ABNORMAL HIGH (ref 100–199)
HDL: 68 mg/dL (ref >39)
LDL Chol Calc (NIH): 135 mg/dL — ABNORMAL HIGH (ref 0–99)
Triglycerides: 73 mg/dL (ref 0–149)
VLDL Cholesterol Cal: 13 mg/dL (ref 5–40)

## 2020-05-28 LAB — T4: T4, Total: 7.3 ug/dL (ref 4.5–12.0)

## 2020-05-28 LAB — CBC WITH DIFFERENTIAL/PLATELET
Basophils Absolute: 0.1 10*3/uL (ref 0.0–0.2)
Basos: 1 %
EOS (ABSOLUTE): 0.4 10*3/uL (ref 0.0–0.4)
Eos: 6 %
Hematocrit: 48.5 % (ref 37.5–51.0)
Hemoglobin: 16.7 g/dL (ref 13.0–17.7)
Immature Grans (Abs): 0 10*3/uL (ref 0.0–0.1)
Immature Granulocytes: 0 %
Lymphocytes Absolute: 1.2 10*3/uL (ref 0.7–3.1)
Lymphs: 18 %
MCH: 30.3 pg (ref 26.6–33.0)
MCHC: 34.4 g/dL (ref 31.5–35.7)
MCV: 88 fL (ref 79–97)
Monocytes Absolute: 0.6 10*3/uL (ref 0.1–0.9)
Monocytes: 9 %
Neutrophils Absolute: 4.4 10*3/uL (ref 1.4–7.0)
Neutrophils: 66 %
Platelets: 230 10*3/uL (ref 150–450)
RBC: 5.52 x10E6/uL (ref 4.14–5.80)
RDW: 12.6 % (ref 11.6–15.4)
WBC: 6.6 10*3/uL (ref 3.4–10.8)

## 2020-05-28 LAB — TSH: TSH: 2.01 u[IU]/mL (ref 0.450–4.500)

## 2020-06-08 ENCOUNTER — Telehealth: Payer: Self-pay

## 2020-06-08 ENCOUNTER — Other Ambulatory Visit: Payer: Self-pay

## 2020-06-08 MED ORDER — PRAVASTATIN SODIUM 40 MG PO TABS: 40.0000 mg | ORAL_TABLET | Freq: Every day | ORAL | 3 refills | Status: DC

## 2020-06-08 NOTE — Telephone Encounter (Signed)
Patient said he had seen his results of labs from December but no one had called him.  I read to him what Simona Huh had noted.  He needs rx for Pravastatin 40 mg. Sent to [facility] [address].

## 2020-06-09 ENCOUNTER — Encounter: Payer: Self-pay | Admitting: Family Medicine

## 2020-06-17 ENCOUNTER — Other Ambulatory Visit: Payer: Self-pay

## 2020-06-17 ENCOUNTER — Ambulatory Visit (INDEPENDENT_AMBULATORY_CARE_PROVIDER_SITE_OTHER): Payer: Medicare Other | Admitting: Family Medicine

## 2020-06-17 ENCOUNTER — Encounter: Payer: Self-pay | Admitting: Family Medicine

## 2020-06-17 VITALS — BP 124/77 | HR 67 | Temp 98.4°F | Resp 16 | Wt 217.0 lb

## 2020-06-17 DIAGNOSIS — Z86010 Personal history of colonic polyps: Secondary | ICD-10-CM

## 2020-06-17 DIAGNOSIS — K225 Diverticulum of esophagus, acquired: Secondary | ICD-10-CM | POA: Diagnosis not present

## 2020-06-17 DIAGNOSIS — E785 Hyperlipidemia, unspecified: Secondary | ICD-10-CM | POA: Diagnosis not present

## 2020-06-17 DIAGNOSIS — K760 Fatty (change of) liver, not elsewhere classified: Secondary | ICD-10-CM

## 2020-06-17 DIAGNOSIS — E038 Other specified hypothyroidism: Secondary | ICD-10-CM

## 2020-06-17 NOTE — Patient Instructions (Signed)
High Cholesterol  High cholesterol is a condition in which the blood has high levels of a white, waxy substance similar to fat (cholesterol). The liver makes all the cholesterol that the body needs. The human body needs small amounts of cholesterol to help build cells. A person gets extra or excess cholesterol from the food that he or she eats. The blood carries cholesterol from the liver to the rest of the body. If you have high cholesterol, deposits (plaques) may build up on the walls of your arteries. Arteries are the blood vessels that carry blood away from your heart. These plaques make the arteries narrow and stiff. Cholesterol plaques increase your risk for heart attack and stroke. Work with your health care provider to keep your cholesterol levels in a healthy range. What increases the risk? The following factors may make you more likely to develop this condition:  Eating foods that are high in animal fat (saturated fat) or cholesterol.  Being overweight.  Not getting enough exercise.  A family history of high cholesterol (familial hypercholesterolemia).  Use of tobacco products.  Having diabetes. What are the signs or symptoms? There are no symptoms of this condition. How is this diagnosed? This condition may be diagnosed based on the results of a blood test.  If you are older than 71 years of age, your health care provider may check your cholesterol levels every 4-6 years.  You may be checked more often if you have high cholesterol or other risk factors for heart disease. The blood test for cholesterol measures:  "Bad" cholesterol, or LDL cholesterol. This is the main type of cholesterol that causes heart disease. The desired level is less than 100 mg/dL.  "Good" cholesterol, or HDL cholesterol. HDL helps protect against heart disease by cleaning the arteries and carrying the LDL to the liver for processing. The desired level for HDL is 60 mg/dL or higher.  Triglycerides.  These are fats that your body can store or burn for energy. The desired level is less than 150 mg/dL.  Total cholesterol. This measures the total amount of cholesterol in your blood and includes LDL, HDL, and triglycerides. The desired level is less than 200 mg/dL. How is this treated? This condition may be treated with:  Diet changes. You may be asked to eat foods that have more fiber and less saturated fats or added sugar.  Lifestyle changes. These may include regular exercise, maintaining a healthy weight, and quitting use of tobacco products.  Medicines. These are given when diet and lifestyle changes have not worked. You may be prescribed a statin medicine to help lower your cholesterol levels. Follow these instructions at home: Eating and drinking  Eat a healthy, balanced diet. This diet includes: ? Daily servings of a variety of fresh, frozen, or canned fruits and vegetables. ? Daily servings of whole grain foods that are rich in fiber. ? Foods that are low in saturated fats and trans fats. These include poultry and fish without skin, lean cuts of meat, and low-fat dairy products. ? A variety of fish, especially oily fish that contain omega-3 fatty acids. Aim to eat fish at least 2 times a week.  Avoid foods and drinks that have added sugar.  Use healthy cooking methods, such as roasting, grilling, broiling, baking, poaching, steaming, and stir-frying. Do not fry your food except for stir-frying.   Lifestyle  Get regular exercise. Aim to exercise for a total of 150 minutes a week. Increase your activity level by doing activities   such as gardening, walking, and taking the stairs.  Do not use any products that contain nicotine or tobacco, such as cigarettes, e-cigarettes, and chewing tobacco. If you need help quitting, ask your health care provider.   General instructions  Take over-the-counter and prescription medicines only as told by your health care provider.  Keep all  follow-up visits as told by your health care provider. This is important. Where to find more information  American Heart Association: www.heart.org  National Heart, Lung, and Blood Institute: www.nhlbi.nih.gov Contact a health care provider if:  You have trouble achieving or maintaining a healthy diet or weight.  You are starting an exercise program.  You are unable to stop smoking. Get help right away if:  You have chest pain.  You have trouble breathing.  You have any symptoms of a stroke. "BE FAST" is an easy way to remember the main warning signs of a stroke: ? B - Balance. Signs are dizziness, sudden trouble walking, or loss of balance. ? E - Eyes. Signs are trouble seeing or a sudden change in vision. ? F - Face. Signs are sudden weakness or numbness of the face, or the face or eyelid drooping on one side. ? A - Arms. Signs are weakness or numbness in an arm. This happens suddenly and usually on one side of the body. ? S - Speech. Signs are sudden trouble speaking, slurred speech, or trouble understanding what people say. ? T - Time. Time to call emergency services. Write down what time symptoms started.  You have other signs of a stroke, such as: ? A sudden, severe headache with no known cause. ? Nausea or vomiting. ? Seizure. These symptoms may represent a serious problem that is an emergency. Do not wait to see if the symptoms will go away. Get medical help right away. Call your local emergency services (911 in the [place] Do not drive yourself to the hospital. Summary  Cholesterol plaques increase your risk for heart attack and stroke. Work with your health care provider to keep your cholesterol levels in a healthy range.  Eat a healthy, balanced diet, get regular exercise, and maintain a healthy weight.  Do not use any products that contain nicotine or tobacco, such as cigarettes, e-cigarettes, and chewing tobacco.  Get help right away if you have any symptoms of a  stroke. This information is not intended to replace advice given to you by your health care provider. Make sure you discuss any questions you have with your health care provider. Document Revised: 04/14/2019 Document Reviewed: 04/14/2019 Elsevier Patient Education  2021 [employer]  

## 2020-06-17 NOTE — Progress Notes (Signed)
Established patient visit   Patient: Daniel [name]   DOB: 1950/07/08   70 y.o. Male  MRN: [id number] Visit Date: 06/17/2020  Today's healthcare provider: Wilhemena Durie, MD   Chief Complaint  Patient presents with  . Follow-up   Subjective    HPI  Overall patient feels very well.  He is still mourning the deaths of both of his wives. Lipid/Cholesterol, follow-up  Last Lipid Panel: Lab Results  Component Value Date   CHOL 216 (H) 05/27/2020   LDLCALC 135 (H) 05/27/2020   HDL 68 05/27/2020   TRIG 73 05/27/2020    He was last seen for this 1 years ago.  Management since that visit includes; labs checked 05/27/2020 showing-normal except total cholesterol and LDL cholesterol high. Recommended Pravastatin 40 mg qd. Recheck appointment with fasting labs in 3-4 months.  Patient reports he did not start Pravastatin due to possible side effects to liver. Patient reports history of liver disease.  He is following a Regular diet. Current exercise: none  Last metabolic panel Lab Results  Component Value Date   GLUCOSE 98 05/27/2020   NA 140 05/27/2020   K 4.3 05/27/2020   BUN 10 05/27/2020   CREATININE 1.00 05/27/2020   GFRNONAA 76 05/27/2020   GFRAA 88 05/27/2020   CALCIUM 9.2 05/27/2020   AST 19 05/27/2020   ALT 16 05/27/2020   The 10-year ASCVD risk score Mikey Bussing DC Jr., et al., 2013) is: 16.8%  ---------------------------------------------------------------------------------------------------   Patient Active Problem List   Diagnosis Date Noted  . Zenker's (hypopharyngeal) diverticulum 03/22/2018  . Gastroesophageal reflux disease 03/22/2018  . Fatty liver 10/09/2017  . BPH (benign prostatic hyperplasia) 11/21/2014  . Hypothyroidism 11/21/2014  . Obesity 11/21/2014  . Asthma 11/21/2014  . Inguinal hernia 11/21/2014  . OA (osteoarthritis) 11/21/2014  . H/O adenomatous polyp of colon 10/14/2014   Social History   Tobacco Use  . Smoking status:  Former Smoker    Types: Cigarettes    Quit date: 1991    Years since quitting: 31.0  . Smokeless tobacco: Never Used  . Tobacco comment: quit smoking 25 years ago  Vaping Use  . Vaping Use: Never used  Substance Use Topics  . Alcohol use: Yes    Comment: 1-2 glasses of wine 4-5 times a week  . Drug use: No   Allergies  Allergen Reactions  . Ciprofloxacin Hcl Other (See Comments)    Blisters break out  . Levaquin [Levofloxacin In D5w]   . Sulfa Antibiotics Other (See Comments)    unknown       Medications: Outpatient Medications Prior to Visit  Medication Sig  . Famotidine (PEPCID PO) Take by mouth.  . Ginkgo Biloba 40 MG TABS Take by mouth.  . levothyroxine (SYNTHROID) 100 MCG tablet TAKE 1 TABLET BY MOUTH EVERY DAY BEFORE BREAKFAST  . Multiple Vitamin (MULTIVITAMIN) capsule Take 1 capsule daily by mouth.  . Omega-3 Fatty Acids (FISH OIL) 1200 MG CPDR Take by mouth.  . pantoprazole (PROTONIX) 40 MG tablet TAKE 1 TABLET(40 MG) BY MOUTH TWICE DAILY  . pravastatin (PRAVACHOL) 40 MG tablet Take 1 tablet (40 mg total) by mouth daily. (Patient not taking: Reported on 06/17/2020)  . [DISCONTINUED] amoxicillin-clavulanate (AUGMENTIN) 875-125 MG tablet Take 1 tablet by mouth 2 (two) times daily. (Patient not taking: Reported on 11/04/2018)   No facility-administered medications prior to visit.    Review of Systems  Constitutional: Negative for appetite change, chills and fever.  Respiratory:  Negative for chest tightness, shortness of breath and wheezing.   Cardiovascular: Negative for chest pain and palpitations.  Gastrointestinal: Negative for abdominal pain, nausea and vomiting.    Last metabolic panel Lab Results  Component Value Date   GLUCOSE 98 05/27/2020   NA 140 05/27/2020   K 4.3 05/27/2020   CL 103 05/27/2020   CO2 23 05/27/2020   BUN 10 05/27/2020   CREATININE 1.00 05/27/2020   GFRNONAA 76 05/27/2020   GFRAA 88 05/27/2020   CALCIUM 9.2 05/27/2020   PROT 6.6  05/27/2020   ALBUMIN 4.2 05/27/2020   LABGLOB 2.4 05/27/2020   AGRATIO 1.8 05/27/2020   BILITOT 0.9 05/27/2020   ALKPHOS 71 05/27/2020   AST 19 05/27/2020   ALT 16 05/27/2020   Last lipids Lab Results  Component Value Date   CHOL 216 (H) 05/27/2020   HDL 68 05/27/2020   LDLCALC 135 (H) 05/27/2020   TRIG 73 05/27/2020   CHOLHDL 3.2 05/27/2020       Objective    BP 124/77 (BP Location: Left Arm, Patient Position: Sitting, Cuff Size: Normal)   Pulse 67   Temp 98.4 F (36.9 C) (Oral)   Resp 16   Wt 217 lb (98.4 kg)   SpO2 100%   BMI 27.86 kg/m  BP Readings from Last 3 Encounters:  06/17/20 124/77  05/27/20 (!) 123/93  10/03/19 130/80   Wt Readings from Last 3 Encounters:  06/17/20 217 lb (98.4 kg)  05/27/20 217 lb 3.2 oz (98.5 kg)  10/03/19 218 lb 12.8 oz (99.2 kg)       Physical Exam Vitals reviewed.  Constitutional:      Appearance: He is well-developed.  HENT:     Head: Normocephalic and atraumatic.     Right Ear: External ear normal.     Left Ear: External ear normal.     Nose: Nose normal.  Eyes:     Conjunctiva/sclera: Conjunctivae normal.     Pupils: Pupils are equal, round, and reactive to light.  Cardiovascular:     Rate and Rhythm: Normal rate and regular rhythm.     Heart sounds: Normal heart sounds.  Pulmonary:     Effort: Pulmonary effort is normal.     Breath sounds: Normal breath sounds.  Abdominal:     General: Bowel sounds are normal.     Palpations: Abdomen is soft.  Genitourinary:    Rectum: Normal.  Musculoskeletal:        General: Normal range of motion.     Cervical back: Normal range of motion and neck supple.     Comments: Trace edema.  Skin:    General: Skin is warm and dry.  Neurological:     Mental Status: He is oriented to person, place, and time.  Psychiatric:        Behavior: Behavior normal.        Thought Content: Thought content normal.        Judgment: Judgment normal.       No results found for any visits  on 06/17/20.  Assessment & Plan     1. Hyperlipidemia, unspecified hyperlipidemia type .  Treat as appropriate. - Lipid Panel With LDL/HDL Ratio  2. Fatty liver Work-up as indicated - Hepatic function panel  3. Zenker's (hypopharyngeal) diverticulum   4. Other specified hypothyroidism   5. H/O adenomatous polyp of colon    No follow-ups on file.      I, Wilhemena Durie, MD, have reviewed all documentation  for this visit. The documentation on 06/25/20 for the exam, diagnosis, procedures, and orders are all accurate and complete.    Patrik Turnbaugh Cranford Mon, MD  [facility] [phone] (phone) [phone] (fax)  [place]

## 2020-07-08 DIAGNOSIS — K760 Fatty (change of) liver, not elsewhere classified: Secondary | ICD-10-CM | POA: Diagnosis not present

## 2020-07-08 DIAGNOSIS — E785 Hyperlipidemia, unspecified: Secondary | ICD-10-CM | POA: Diagnosis not present

## 2020-07-09 LAB — LIPID PANEL WITH LDL/HDL RATIO
Cholesterol, Total: 144 mg/dL (ref 100–199)
HDL: 48 mg/dL (ref >39)
LDL Chol Calc (NIH): 79 mg/dL (ref 0–99)
LDL/HDL Ratio: 1.6 ratio (ref 0.0–3.6)
Triglycerides: 87 mg/dL (ref 0–149)
VLDL Cholesterol Cal: 17 mg/dL (ref 5–40)

## 2020-07-09 LAB — HEPATIC FUNCTION PANEL
ALT: 17 IU/L (ref 0–44)
AST: 20 IU/L (ref 0–40)
Albumin: 4.2 g/dL (ref 3.7–4.7)
Alkaline Phosphatase: 69 IU/L (ref 44–121)
Bilirubin Total: 0.6 mg/dL (ref 0.0–1.2)
Bilirubin, Direct: 0.16 mg/dL (ref 0.00–0.40)
Total Protein: 6.5 g/dL (ref 6.0–8.5)

## 2020-07-09 NOTE — Progress Notes (Signed)
I,April Miller,acting as a scribe for Wilhemena Durie, MD.,have documented all relevant documentation on the behalf of Wilhemena Durie, MD,as directed by  Wilhemena Durie, MD while in the presence of Wilhemena Durie, MD.   Established patient visit   Patient: Daniel [name]   DOB: 17-Dec-1950   70 y.o. Male  MRN: [id number] Visit Date: 07/12/2020  Today's healthcare provider: Wilhemena Durie, MD   Chief Complaint  Patient presents with  . Follow-up  . Hyperlipidemia   Subjective    HPI  Patient comes in today for follow-up after starting pravastatin.  He has had a couple of days of diarrhea that resolved but then he developed pain in the feet legs and back which have persisted.  He directly associates this with taking the pravastatin. Lipid/Cholesterol, follow-up  Last Lipid Panel: Lab Results  Component Value Date   CHOL 144 07/08/2020   LDLCALC 79 07/08/2020   HDL 48 07/08/2020   TRIG 87 07/08/2020    He was last seen for this 1   ago.  Management since that visit includes; started pravastatin 40 mg qd.  He reports good compliance with treatment. He is having side effects. Feet, joint pain He is following a Regular, Low Sodium diet. Current exercise: yard work  Last metabolic panel Lab Results  Component Value Date   GLUCOSE 98 05/27/2020   NA 140 05/27/2020   K 4.3 05/27/2020   BUN 10 05/27/2020   CREATININE 1.00 05/27/2020   GFRNONAA 76 05/27/2020   GFRAA 88 05/27/2020   CALCIUM 9.2 05/27/2020   AST 20 07/08/2020   ALT 17 07/08/2020   The 10-year ASCVD risk score Mikey Bussing DC Jr., et al., 2013) is: 16.3%  --------------------------------------------------------------------       Medications: Outpatient Medications Prior to Visit  Medication Sig  . Famotidine (PEPCID PO) Take by mouth.  . Ginkgo Biloba 40 MG TABS Take by mouth.  . levothyroxine (SYNTHROID) 100 MCG tablet TAKE 1 TABLET BY MOUTH EVERY DAY BEFORE BREAKFAST  . Multiple  Vitamin (MULTIVITAMIN) capsule Take 1 capsule daily by mouth.  . Omega-3 Fatty Acids (FISH OIL) 1200 MG CPDR Take by mouth.  . pantoprazole (PROTONIX) 40 MG tablet TAKE 1 TABLET(40 MG) BY MOUTH TWICE DAILY  . pravastatin (PRAVACHOL) 40 MG tablet Take 1 tablet (40 mg total) by mouth daily.   No facility-administered medications prior to visit.    Review of Systems  Constitutional: Negative for appetite change, chills and fever.  Respiratory: Negative for chest tightness, shortness of breath and wheezing.   Cardiovascular: Negative for chest pain and palpitations.  Gastrointestinal: Negative for abdominal pain, nausea and vomiting.        Objective    BP 125/76 (BP Location: Left Arm, Patient Position: Sitting, Cuff Size: Large)   Pulse 68   Temp 97.7 F (36.5 C) (Oral)   Resp 16   Ht 6\' 2"  (1.88 m)   Wt 218 lb (98.9 kg)   SpO2 99%   BMI 27.99 kg/m  BP Readings from Last 3 Encounters:  07/12/20 125/76  06/17/20 124/77  05/27/20 (!) 123/93   Wt Readings from Last 3 Encounters:  07/12/20 218 lb (98.9 kg)  06/17/20 217 lb (98.4 kg)  05/27/20 217 lb 3.2 oz (98.5 kg)       Physical Exam Vitals reviewed.  Constitutional:      Appearance: He is well-developed.  HENT:     Head: Normocephalic and atraumatic.  Right Ear: External ear normal.     Left Ear: External ear normal.     Nose: Nose normal.  Eyes:     Conjunctiva/sclera: Conjunctivae normal.     Pupils: Pupils are equal, round, and reactive to light.  Cardiovascular:     Rate and Rhythm: Normal rate and regular rhythm.     Heart sounds: Normal heart sounds.  Pulmonary:     Effort: Pulmonary effort is normal.     Breath sounds: Normal breath sounds.  Abdominal:     General: Bowel sounds are normal.     Palpations: Abdomen is soft.  Musculoskeletal:     Cervical back: Normal range of motion and neck supple.     Comments: Trace edema.  Skin:    General: Skin is warm and dry.  Neurological:     Mental  Status: He is oriented to person, place, and time.  Psychiatric:        Mood and Affect: Mood normal.        Behavior: Behavior normal.        Thought Content: Thought content normal.        Judgment: Judgment normal.       No results found for any visits on 07/12/20.  Assessment & Plan     1. Hyperlipidemia, unspecified hyperlipidemia typ After discussion with the patient will stop pravastatin and start Zetia 10 mg in the morning.  Lipids in 4 to 6 weeks and then follow-up a week later. - Lipid panel  2. 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   No follow-ups on file.      I, Wilhemena Durie, MD, have reviewed all documentation for this visit. The documentation on 07/12/20 for the exam, diagnosis, procedures, and orders are all accurate and complete.    Manuella Blackson Cranford Mon, MD  [facility] [phone] (phone) [phone] (fax)  [place]

## 2020-07-12 ENCOUNTER — Encounter: Payer: Self-pay | Admitting: Family Medicine

## 2020-07-12 ENCOUNTER — Ambulatory Visit (INDEPENDENT_AMBULATORY_CARE_PROVIDER_SITE_OTHER): Payer: Medicare Other | Admitting: Family Medicine

## 2020-07-12 ENCOUNTER — Other Ambulatory Visit: Payer: Self-pay

## 2020-07-12 VITALS — BP 125/76 | HR 68 | Temp 97.7°F | Resp 16 | Ht 74.0 in | Wt 218.0 lb

## 2020-07-12 DIAGNOSIS — E785 Hyperlipidemia, unspecified: Secondary | ICD-10-CM | POA: Diagnosis not present

## 2020-07-12 DIAGNOSIS — M791 Myalgia, unspecified site: Secondary | ICD-10-CM

## 2020-07-12 DIAGNOSIS — T466X5A Adverse effect of antihyperlipidemic and antiarteriosclerotic drugs, initial encounter: Secondary | ICD-10-CM | POA: Diagnosis not present

## 2020-07-12 MED ORDER — EZETIMIBE 10 MG PO TABS: 10.0000 mg | ORAL_TABLET | ORAL | 2 refills | Status: DC

## 2020-07-14 DIAGNOSIS — H26492 Other secondary cataract, left eye: Secondary | ICD-10-CM | POA: Diagnosis not present

## 2020-08-03 DIAGNOSIS — H26492 Other secondary cataract, left eye: Secondary | ICD-10-CM | POA: Diagnosis not present

## 2020-08-09 DIAGNOSIS — E785 Hyperlipidemia, unspecified: Secondary | ICD-10-CM | POA: Diagnosis not present

## 2020-08-10 LAB — LIPID PANEL
Chol/HDL Ratio: 3.3 ratio (ref 0.0–5.0)
Cholesterol, Total: 177 mg/dL (ref 100–199)
HDL: 54 mg/dL (ref >39)
LDL Chol Calc (NIH): 100 mg/dL — ABNORMAL HIGH (ref 0–99)
Triglycerides: 131 mg/dL (ref 0–149)
VLDL Cholesterol Cal: 23 mg/dL (ref 5–40)

## 2020-08-16 ENCOUNTER — Other Ambulatory Visit: Payer: Self-pay

## 2020-08-16 ENCOUNTER — Encounter: Payer: Self-pay | Admitting: Family Medicine

## 2020-08-16 ENCOUNTER — Ambulatory Visit (INDEPENDENT_AMBULATORY_CARE_PROVIDER_SITE_OTHER): Payer: Medicare Other | Admitting: Family Medicine

## 2020-08-16 VITALS — BP 127/85 | HR 64 | Temp 98.5°F | Resp 16 | Ht 74.0 in | Wt 221.0 lb

## 2020-08-16 DIAGNOSIS — E785 Hyperlipidemia, unspecified: Secondary | ICD-10-CM | POA: Diagnosis not present

## 2020-08-16 DIAGNOSIS — Z6830 Body mass index (BMI) 30.0-30.9, adult: Secondary | ICD-10-CM

## 2020-08-16 DIAGNOSIS — M791 Myalgia, unspecified site: Secondary | ICD-10-CM

## 2020-08-16 DIAGNOSIS — Z86010 Personal history of colonic polyps: Secondary | ICD-10-CM

## 2020-08-16 DIAGNOSIS — E6609 Other obesity due to excess calories: Secondary | ICD-10-CM

## 2020-08-16 DIAGNOSIS — K760 Fatty (change of) liver, not elsewhere classified: Secondary | ICD-10-CM

## 2020-08-16 DIAGNOSIS — T466X5A Adverse effect of antihyperlipidemic and antiarteriosclerotic drugs, initial encounter: Secondary | ICD-10-CM

## 2020-08-16 NOTE — Progress Notes (Signed)
I,April Miller,acting as a scribe for Wilhemena Durie, MD.,have documented all relevant documentation on the behalf of Wilhemena Durie, MD,as directed by  Wilhemena Durie, MD while in the presence of Wilhemena Durie, MD.   Established patient visit   Patient: Daniel [name]   DOB: 01/01/1951   70 y.o. Male  MRN: [id number] Visit Date: 08/16/2020  Today's healthcare provider: Wilhemena Durie, MD   Chief Complaint  Patient presents with  . Follow-up  . Hyperlipidemia   Subjective    HPI  Patient is better off the pravastatin and having fewer arthralgias.  Tolerating Zetia well and his LDL is only jumped from 79-100. He has no complaints except that he found a tick on his right lateral lower leg yesterday that he had to pull off.  It is mildly irritating but he is putting some cream on it and keeping it clean. Lipid/Cholesterol, follow-up  Last Lipid Panel: Lab Results  Component Value Date   CHOL 177 08/09/2020   LDLCALC 100 (H) 08/09/2020   HDL 54 08/09/2020   TRIG 131 08/09/2020    He was last seen for this 1 months ago.  Management since that visit includes; After discussion with the patient will stop pravastatin and start Zetia 10 mg in the morning.  Lipids in 4 to 6 weeks and then follow-up a week later.  He reports good compliance with treatment. He is not having side effects. none  Symptoms: No appetite changes No foot ulcerations  No chest pain No chest pressure/discomfort  No dyspnea No orthopnea  No fatigue No lower extremity edema  No palpitations No paroxysmal nocturnal dyspnea  No nausea No numbness or tingling of extremity  No polydipsia No polyuria  No speech difficulty No syncope   He is following a Low fat diet. Current exercise: none  Last metabolic panel Lab Results  Component Value Date   GLUCOSE 98 05/27/2020   NA 140 05/27/2020   K 4.3 05/27/2020   BUN 10 05/27/2020   CREATININE 1.00 05/27/2020   GFRNONAA 76 05/27/2020    GFRAA 88 05/27/2020   CALCIUM 9.2 05/27/2020   AST 20 07/08/2020   ALT 17 07/08/2020   The 10-year ASCVD risk score Mikey Bussing DC Jr., et al., 2013) is: 17.5%  --------------------------------------------------------------------  Patient states his feet hurt him when he first started Zetia. However that has since resolved. He has been doing well on medication.  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      Medications: Outpatient Medications Prior to Visit  Medication Sig  . ezetimibe (ZETIA) 10 MG tablet Take 1 tablet (10 mg total) by mouth every morning.  . Famotidine (PEPCID PO) Take by mouth.  . Ginkgo Biloba 40 MG TABS Take by mouth.  . levothyroxine (SYNTHROID) 100 MCG tablet TAKE 1 TABLET BY MOUTH EVERY DAY BEFORE BREAKFAST  . Multiple Vitamin (MULTIVITAMIN) capsule Take 1 capsule daily by mouth.  . Omega-3 Fatty Acids (FISH OIL) 1200 MG CPDR Take by mouth.  . pantoprazole (PROTONIX) 40 MG tablet TAKE 1 TABLET(40 MG) BY MOUTH TWICE DAILY   No facility-administered medications prior to visit.    Review of Systems  Constitutional: Negative for appetite change, chills and fever.  Respiratory: Negative for chest tightness, shortness of breath and wheezing.   Cardiovascular: Negative for chest  pain and palpitations.  Gastrointestinal: Negative for abdominal pain, nausea and vomiting.        Objective    BP 127/85 (BP Location: Right Arm, Patient Position: Sitting, Cuff Size: Large)   Pulse 64   Temp 98.5 F (36.9 C) (Oral)   Resp 16   Ht 6\' 2"  (1.88 m)   Wt 221 lb (100.2 kg)   SpO2 97%   BMI 28.37 kg/m  BP Readings from Last 3 Encounters:  08/16/20 127/85  07/12/20 125/76  06/17/20 124/77   Wt Readings from Last 3 Encounters:  08/16/20 221 lb (100.2 kg)  07/12/20 218 lb (98.9 kg)  06/17/20 217 lb  (98.4 kg)       Physical Exam Vitals reviewed.  Constitutional:      Appearance: He is well-developed.  HENT:     Head: Normocephalic and atraumatic.     Right Ear: External ear normal.     Left Ear: External ear normal.     Nose: Nose normal.  Eyes:     Conjunctiva/sclera: Conjunctivae normal.     Pupils: Pupils are equal, round, and reactive to light.  Cardiovascular:     Rate and Rhythm: Normal rate and regular rhythm.     Heart sounds: Normal heart sounds.  Pulmonary:     Effort: Pulmonary effort is normal.     Breath sounds: Normal breath sounds.  Abdominal:     General: Bowel sounds are normal.     Palpations: Abdomen is soft.  Genitourinary:    Rectum: Normal.  Musculoskeletal:     Cervical back: Normal range of motion and neck supple.     Comments: Trace edema.  Skin:    General: Skin is warm and dry.     Comments: Site of tick bite on his right lower leg is raised with no erythema and no drainage and no signs of infection.  Neurological:     General: No focal deficit present.     Mental Status: He is oriented to person, place, and time.  Psychiatric:        Mood and Affect: Mood normal.        Behavior: Behavior normal.        Thought Content: Thought content normal.        Judgment: Judgment normal.       No results found for any visits on 08/16/20.  Assessment & Plan     1. Hyperlipidemia, unspecified hyperlipidemia type Continue Zetia for now.  No documented vascular disease.  LDL at 100 is good.  2. 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 Fatty liver Patient advised to work on diet and exercise.  He is ready to start exercise now that the weather is getting warmer. Follow-up 6 months. 4. H/O adenomatous polyp of colon   5. Class 1 obesity due to excess calories without serious comorbidity with body mass index (BMI) of 30.0 to 30.9 in adult    No follow-ups on file.      I, Wilhemena Durie, MD, have reviewed all documentation  for this visit. The documentation on 08/16/20 for the exam, diagnosis, procedures, and orders are all accurate and complete.    Tupac Jeffus Cranford Mon, MD  [facility] [phone] (phone) [phone] (fax)  [place]

## 2020-09-14 ENCOUNTER — Encounter: Payer: Medicare Other | Admitting: Dermatology

## 2020-09-15 ENCOUNTER — Other Ambulatory Visit: Payer: Self-pay | Admitting: Family Medicine

## 2020-09-21 ENCOUNTER — Ambulatory Visit: Payer: Medicare Other | Admitting: Podiatry

## 2020-09-21 ENCOUNTER — Ambulatory Visit (INDEPENDENT_AMBULATORY_CARE_PROVIDER_SITE_OTHER): Payer: Medicare Other

## 2020-09-21 ENCOUNTER — Other Ambulatory Visit: Payer: Self-pay

## 2020-09-21 DIAGNOSIS — M2041 Other hammer toe(s) (acquired), right foot: Secondary | ICD-10-CM | POA: Diagnosis not present

## 2020-09-21 DIAGNOSIS — M7751 Other enthesopathy of right foot: Secondary | ICD-10-CM

## 2020-09-21 DIAGNOSIS — M2042 Other hammer toe(s) (acquired), left foot: Secondary | ICD-10-CM

## 2020-09-21 MED ORDER — BETAMETHASONE SOD PHOS & ACET 6 (3-3) MG/ML IJ SUSP
3.0000 mg | Freq: Once | INTRAMUSCULAR | Status: AC
  Administered 2020-09-21: 3 mg via INTRA_ARTICULAR

## 2020-09-21 NOTE — Progress Notes (Signed)
   HPI: 70 y.o. male presenting today for evaluation of right forefoot pain is been going on for approximately 1 month.  Patient believes that he has a hammertoe to the right second digit that is causing the pain.  He states the pain hurts on top of his foot when he goes walking.  He walks approximately 2 miles per day.  The pain is also exacerbated in the evenings.  Currently he has not done anything for treatment.  He presents for further treatment and evaluation  Past Medical History:  Diagnosis Date  . Actinic keratosis   . Asthma   . Eczema   . Hx of dysplastic nevus 06/06/2006   Mid lower back, slight atypia  . Hypothyroidism   . Seasonal allergies   . Thyroid disease      Physical Exam: General: The patient is alert and oriented x3 in no acute distress.  Dermatology: Skin is warm, dry and supple bilateral lower extremities. Negative for open lesions or macerations.  Vascular: Palpable pedal pulses bilaterally. No edema or erythema noted. Capillary refill within normal limits.  Neurological: Epicritic and protective threshold grossly intact bilaterally.   Musculoskeletal Exam: Range of motion within normal limits to all pedal and ankle joints bilateral. Muscle strength 5/5 in all groups bilateral.  There is some pain on palpation just proximal to the second MTPJ of the right foot  Radiographic Exam:  Normal osseous mineralization. Joint spaces preserved. No fracture/dislocation/boney destruction.  There is some contracture of the PIPJ and dorsal contracture of the MTPJ consistent with hammertoe deformity second digit  Assessment: 1.  Hammertoe second digit right 2.  Second MTPJ capsulitis right   Plan of Care:  1. Patient evaluated. X-Rays reviewed.  2.  Before discussing surgery I would like to pursue some conservative treatment options.  Injection of 0.5 cc Celestone Soluspan injected into the second MTPJ right 3.  Continue wearing good supportive shoes and sneakers 4.   Return to clinic in 4 weeks.  If the patient is not much better we may need to consider hammertoe repair surgery      Edrick Kins, DPM [facility]  Dr. Edrick Kins, DPM    [address], [place] [address]                Office [phone]  Fax [phone]

## 2020-10-17 ENCOUNTER — Other Ambulatory Visit: Payer: Self-pay | Admitting: Family Medicine

## 2020-10-17 NOTE — Telephone Encounter (Signed)
Requested Prescriptions  Pending Prescriptions Disp Refills  . ezetimibe (ZETIA) 10 MG tablet [Pharmacy Med Name: EZETIMIBE 10MG  TABLETS] 90 tablet 2    Sig: TAKE 1 TABLET(10 MG) BY MOUTH EVERY MORNING     Cardiovascular:  Antilipid - Sterol Transport Inhibitors Failed - 10/17/2020 12:02 PM      Failed - LDL in normal range and within 360 days    LDL Cholesterol (Calc)  Date Value Ref Range Status  04/12/2017 113 (H) mg/dL (calc) Final    Comment:    Reference range: <100 . Desirable range <100 mg/dL for primary prevention;   <70 mg/dL for patients with CHD or diabetic patients  with > or = 2 CHD risk factors. Marland Kitchen LDL-C is now calculated using the Martin-Hopkins  calculation, which is a validated novel method providing  better accuracy than the Friedewald equation in the  estimation of LDL-C.  Cresenciano Genre et al. Annamaria Helling. 7371;062(69): 2061-2068  (http://education.[employer].com/faq/FAQ164)    LDL Chol Calc (NIH)  Date Value Ref Range Status  08/09/2020 100 (H) 0 - 99 mg/dL Final         Passed - Total Cholesterol in normal range and within 360 days    Cholesterol, Total  Date Value Ref Range Status  08/09/2020 177 100 - 199 mg/dL Final         Passed - HDL in normal range and within 360 days    HDL  Date Value Ref Range Status  08/09/2020 54 >39 mg/dL Final         Passed - Triglycerides in normal range and within 360 days    Triglycerides  Date Value Ref Range Status  08/09/2020 131 0 - 149 mg/dL Final         Passed - Valid encounter within last 12 months    Recent Outpatient Visits          2 months ago Hyperlipidemia, unspecified hyperlipidemia type   [facility] Jerrol Banana., MD   3 months ago Hyperlipidemia, unspecified hyperlipidemia type   [facility] Jerrol Banana., MD   4 months ago Hyperlipidemia, unspecified hyperlipidemia type   [facility] Jerrol Banana., MD   6 months ago Need  for tetanus booster   [facility] Jerrol Banana., MD   1 year ago Foreign body in auricle of left ear, initial encounter   [facility] Trinna Post, [place]      Future Appointments            In 4 months Jerrol Banana., MD [facility], [place]

## 2020-10-19 ENCOUNTER — Other Ambulatory Visit: Payer: Self-pay

## 2020-10-19 ENCOUNTER — Ambulatory Visit: Payer: Medicare Other | Admitting: Podiatry

## 2020-10-19 ENCOUNTER — Encounter: Payer: Self-pay | Admitting: Podiatry

## 2020-10-19 DIAGNOSIS — M2041 Other hammer toe(s) (acquired), right foot: Secondary | ICD-10-CM | POA: Diagnosis not present

## 2020-10-19 DIAGNOSIS — M2042 Other hammer toe(s) (acquired), left foot: Secondary | ICD-10-CM | POA: Diagnosis not present

## 2020-10-19 DIAGNOSIS — M7751 Other enthesopathy of right foot: Secondary | ICD-10-CM

## 2020-10-19 NOTE — Progress Notes (Signed)
   HPI: 70 y.o. male presenting today for follow-up evaluation of hammertoe deformity with MTPJ capsulitis to the second digit of the right foot.  Patient believes that he has a hammertoe to the right second digit that is causing the pain.  He states the pain hurts on top of his foot when he goes walking.  He walks approximately 2 miles per day.  The pain is also exacerbated in the evenings.    Last visit steroid injection was administered and the patient states that he feels significantly better.  Today he has no pain associated to the toe.  He has been full activity with no restrictions.  Past Medical History:  Diagnosis Date  . Actinic keratosis   . Asthma   . Eczema   . Hx of dysplastic nevus 06/06/2006   Mid lower back, slight atypia  . Hypothyroidism   . Seasonal allergies   . Thyroid disease      Physical Exam: General: The patient is alert and oriented x3 in no acute distress.  Dermatology: Skin is warm, dry and supple bilateral lower extremities. Negative for open lesions or macerations.  Vascular: Palpable pedal pulses bilaterally. No edema or erythema noted. Capillary refill within normal limits.  Neurological: Epicritic and protective threshold grossly intact bilaterally.   Musculoskeletal Exam: Range of motion within normal limits to all pedal and ankle joints bilateral. Muscle strength 5/5 in all groups bilateral.  Negative for any significant pain on palpation just proximal to the second MTPJ of the right foot  Radiographic Exam taken last visit:  Normal osseous mineralization. Joint spaces preserved. No fracture/dislocation/boney destruction.  There is some contracture of the PIPJ and dorsal contracture of the MTPJ consistent with hammertoe deformity second digit  Assessment: 1.  Hammertoe second digit right 2.  Second MTPJ capsulitis right   Plan of Care:  1. Patient evaluated.  2.  Patient may resume full activity with no restrictions 3.  Recommend good  supportive shoes and sneakers 4.  Return to clinic as needed      Edrick Kins, DPM [facility]  Dr. Edrick Kins, DPM    [address], [place] [address]                Office [phone]  Fax [phone]

## 2020-10-22 ENCOUNTER — Other Ambulatory Visit: Payer: Self-pay | Admitting: Family Medicine

## 2020-11-15 ENCOUNTER — Other Ambulatory Visit: Payer: Self-pay

## 2020-11-15 ENCOUNTER — Ambulatory Visit: Payer: Medicare Other | Admitting: Dermatology

## 2020-11-15 DIAGNOSIS — L738 Other specified follicular disorders: Secondary | ICD-10-CM

## 2020-11-15 NOTE — Progress Notes (Signed)
   Follow-Up Visit   Subjective  Daniel [name] is a 70 y.o. male who presents for the following: follow up (Patient here today for concerns about bump on right lower edge of lip. Patient reports that it has been present for 3 weeks. Reports dentist just looked at and recommended follow up. Patient has history of pre cancerous places on face he has treated with cream. ).  The following portions of the chart were reviewed this encounter and updated as appropriate:  Tobacco  Allergies  Meds  Problems  Med Hx  Surg Hx  Fam Hx       Objective  Well appearing patient in no apparent distress; mood and affect are within normal limits.  A focused examination was performed including right lower lip. Relevant physical exam findings are noted in the Assessment and Plan.  Right Lower Vermilion Lip Small yellow papules with a central dell.   Assessment & Plan  Sebaceous hyperplasia of face Right Lower Vermilion Lip  Discussed cosmetic procedure, noncovered.  $60 for 1st lesion and $15 for each additional lesion if done on the same day.  Maximum charge $350.  One touch-up treatment included no charge. Discussed risks of treatment including dyspigmentation, small scar, and/or recurrence.   Patient would like treatment  Non Covered procedure form signed  Destruction of lesion - Right Lower Vermilion Lip Complexity: simple   Destruction method comment:  Electrodesiccation Informed consent: discussed and consent obtained   Hemostasis achieved with:  electrodesiccation Outcome: patient tolerated procedure well with no complications   Post-procedure details: wound care instructions given    No follow-ups on file. IRuthell Rummage, CMA, am acting as scribe for Sarina Ser, MD.  Documentation: I have reviewed the above documentation for accuracy and completeness, and I agree with the above.  Sarina Ser, MD

## 2020-11-15 NOTE — Patient Instructions (Signed)

## 2020-11-18 ENCOUNTER — Encounter: Payer: Self-pay | Admitting: Dermatology

## 2020-11-22 ENCOUNTER — Ambulatory Visit: Payer: Medicare Other | Admitting: Family Medicine

## 2020-11-30 ENCOUNTER — Ambulatory Visit (INDEPENDENT_AMBULATORY_CARE_PROVIDER_SITE_OTHER): Payer: Medicare Other | Admitting: Family Medicine

## 2020-11-30 ENCOUNTER — Other Ambulatory Visit: Payer: Self-pay

## 2020-11-30 ENCOUNTER — Ambulatory Visit
Admission: RE | Admit: 2020-11-30 | Discharge: 2020-11-30 | Disposition: A | Discharge status: Home / Self Care | Payer: Medicare Other | Source: Ambulatory Visit | Attending: Family Medicine | Admitting: Family Medicine

## 2020-11-30 ENCOUNTER — Ambulatory Visit
Admission: RE | Admit: 2020-11-30 | Discharge: 2020-11-30 | Disposition: A | Discharge status: Home / Self Care | Payer: Medicare Other | Attending: Family Medicine | Admitting: Family Medicine

## 2020-11-30 ENCOUNTER — Encounter: Payer: Self-pay | Admitting: Family Medicine

## 2020-11-30 VITALS — BP 101/69 | HR 66 | Resp 16 | Wt 214.8 lb

## 2020-11-30 DIAGNOSIS — K219 Gastro-esophageal reflux disease without esophagitis: Secondary | ICD-10-CM | POA: Diagnosis not present

## 2020-11-30 DIAGNOSIS — M545 Low back pain, unspecified: Secondary | ICD-10-CM | POA: Insufficient documentation

## 2020-11-30 DIAGNOSIS — M47816 Spondylosis without myelopathy or radiculopathy, lumbar region: Secondary | ICD-10-CM | POA: Diagnosis not present

## 2020-11-30 DIAGNOSIS — E038 Other specified hypothyroidism: Secondary | ICD-10-CM | POA: Diagnosis not present

## 2020-11-30 DIAGNOSIS — M47817 Spondylosis without myelopathy or radiculopathy, lumbosacral region: Secondary | ICD-10-CM | POA: Diagnosis not present

## 2020-11-30 DIAGNOSIS — N400 Enlarged prostate without lower urinary tract symptoms: Secondary | ICD-10-CM

## 2020-11-30 MED ORDER — PREDNISONE 10 MG PO TABS
ORAL_TABLET | ORAL | 0 refills | Status: AC
Start: 2020-11-30 — End: 2020-12-06

## 2020-11-30 NOTE — Progress Notes (Signed)
Established patient visit   Patient: Daniel [name]   DOB: [birth year]   70 y.o. Male  MRN: [id number] Visit Date: 11/30/2020  Today's healthcare provider: Wilhemena Durie, MD   Chief Complaint  Patient presents with   Back Pain   Subjective    Back Pain This is a new problem. The current episode started 1 to 4 weeks ago. The problem occurs intermittently. The problem is unchanged. The pain is present in the lumbar spine. Quality: described as tightness. Radiates to: both hips, primarily right. The pain is at a severity of 8/10. The pain is The same all the time. The symptoms are aggravated by standing and position. Stiffness is present All day. Pertinent negatives include no abdominal pain, bladder incontinence, bowel incontinence, chest pain, dysuria, fever, headaches, leg pain, numbness, paresis, paresthesias, pelvic pain, perianal numbness, tingling, weakness or weight loss. He has tried NSAIDs for the symptoms. The treatment provided no relief.   Walking actually helps the pain.  He loosens up as he moves around. No radicular symptoms.  Patient Active Problem List   Diagnosis Date Noted   Zenker's (hypopharyngeal) diverticulum 03/22/2018   Gastroesophageal reflux disease 03/22/2018   Fatty liver 10/09/2017   BPH (benign prostatic hyperplasia) 11/21/2014   Hypothyroidism 11/21/2014   Obesity 11/21/2014   Asthma 11/21/2014   Inguinal hernia 11/21/2014   OA (osteoarthritis) 11/21/2014   H/O adenomatous polyp of colon 10/14/2014   Past Medical History:  Diagnosis Date   Actinic keratosis    Asthma    Eczema    Hx of dysplastic nevus 06/06/2006   Mid lower back, slight atypia   Hypothyroidism    Seasonal allergies    Thyroid disease    Allergies  Allergen Reactions   Ciprofloxacin Hcl Other (See Comments)    Blisters break out   Levaquin [Levofloxacin In D5w]    Sulfa Antibiotics Other (See Comments)    unknown       Medications: Outpatient Medications  Prior to Visit  Medication Sig   Famotidine (PEPCID PO) Take by mouth.   Ginkgo Biloba 40 MG TABS Take by mouth.   levothyroxine (SYNTHROID) 100 MCG tablet TAKE 1 TABLET BY MOUTH EVERY DAY BEFORE BREAKFAST   Multiple Vitamin (MULTIVITAMIN) capsule Take 1 capsule daily by mouth.   Omega-3 Fatty Acids (FISH OIL) 1200 MG CPDR Take by mouth.   pantoprazole (PROTONIX) 40 MG tablet TAKE 1 TABLET(40 MG) BY MOUTH TWICE DAILY   ezetimibe (ZETIA) 10 MG tablet TAKE 1 TABLET(10 MG) BY MOUTH EVERY MORNING (Patient not taking: Reported on 11/30/2020)   No facility-administered medications prior to visit.    Review of Systems  Constitutional:  Negative for fever and weight loss.  Cardiovascular:  Negative for chest pain.  Gastrointestinal:  Negative for abdominal pain and bowel incontinence.  Genitourinary:  Negative for bladder incontinence, dysuria and pelvic pain.  Musculoskeletal:  Positive for back pain.  Neurological:  Negative for tingling, weakness, numbness, headaches and paresthesias.      Objective    BP 101/69   Pulse 66   Resp 16   Wt 214 lb 12.8 oz (97.4 kg)   SpO2 99%   BMI 27.58 kg/m     Physical Exam Vitals reviewed.  Constitutional:      Appearance: He is well-developed.  HENT:     Head: Normocephalic and atraumatic.     Right Ear: External ear normal.     Left Ear: External ear normal.  Nose: Nose normal.  Eyes:     Conjunctiva/sclera: Conjunctivae normal.     Pupils: Pupils are equal, round, and reactive to light.  Cardiovascular:     Rate and Rhythm: Normal rate and regular rhythm.     Heart sounds: Normal heart sounds.  Pulmonary:     Effort: Pulmonary effort is normal.     Breath sounds: Normal breath sounds.  Abdominal:     General: Bowel sounds are normal.     Palpations: Abdomen is soft.  Musculoskeletal:     Cervical back: Normal range of motion and neck supple.     Comments: Trace edema.  Skin:    General: Skin is warm and dry.  Neurological:      General: No focal deficit present.     Mental Status: He is oriented to person, place, and time.     Comments: Straight leg raise is negative bilaterally with no muscle weakness or sensory deficit.  He has no tenderness over his spine  Psychiatric:        Mood and Affect: Mood normal.        Behavior: Behavior normal.        Thought Content: Thought content normal.        Judgment: Judgment normal.      No results found for any visits on 11/30/20.  Assessment & Plan     1. Acute bilateral low back pain without sciatica This is musculoskeletal pain in addition to arthritic/degenerative disc disease.  No signs of sciatica. - predniSONE (DELTASONE) 10 MG tablet; Take 6 tablets (60 mg total) by mouth daily with breakfast for 1 day, THEN 5 tablets (50 mg total) daily with breakfast for 1 day, THEN 4 tablets (40 mg total) daily with breakfast for 1 day, THEN 3 tablets (30 mg total) daily with breakfast for 1 day, THEN 2 tablets (20 mg total) daily with breakfast for 1 day, THEN 1 tablet (10 mg total) daily with breakfast for 1 day.  Dispense: 21 tablet; Refill: 0 - Ambulatory referral to Physical Therapy - DG Lumbar Spine Complete; Future  2. Gastroesophageal reflux disease without esophagitis stable  3. Other specified hypothyroidism Follow-up TSH  4. Spondylosis of lumbosacral region without myelopathy or radiculopathy Certainly contributory to his back pain.  5. Benign prostatic hyperplasia without lower urinary tract symptoms Tributary to present problem.   No follow-ups on file.      I, Wilhemena Durie, MD, have reviewed all documentation for this visit. The documentation on 12/03/20 for the exam, diagnosis, procedures, and orders are all accurate and complete.    Ollivander See Cranford Mon, MD  [facility] [phone] (phone) [phone] (fax)  [place]

## 2020-11-30 NOTE — Patient Instructions (Signed)
Try over the counter Metamucil each morning

## 2020-12-14 ENCOUNTER — Telehealth: Payer: Self-pay

## 2020-12-14 MED ORDER — PREDNISONE 20 MG PO TABS: 20.0000 mg | ORAL_TABLET | Freq: Every day | ORAL | 0 refills | Status: AC

## 2020-12-14 NOTE — Telephone Encounter (Signed)
Copied from [place] [id number]. Topic: General - Other >> Dec 14, 2020  9:21 AM Tessa Lerner A wrote: Reason for CRM: Patient would like to be contacted by clinical staff regarding their prescription for prednisone  Patient has not taken the medication in roughly a week but shares that their back pain is still persistimng some, not as intensely as before through   Please contact further

## 2020-12-14 NOTE — Telephone Encounter (Signed)
Medication sent into the pharmacy. Patient advised.

## 2020-12-14 NOTE — Telephone Encounter (Signed)
Please review. Ok to send in prednisone?

## 2020-12-27 ENCOUNTER — Ambulatory Visit: Payer: Medicare Other | Admitting: Physical Therapy

## 2020-12-29 ENCOUNTER — Ambulatory Visit: Payer: Medicare Other | Admitting: Physical Therapy

## 2020-12-29 ENCOUNTER — Encounter: Payer: Self-pay | Admitting: Physical Therapy

## 2020-12-29 ENCOUNTER — Ambulatory Visit: Payer: Medicare Other | Attending: Family Medicine | Admitting: Physical Therapy

## 2020-12-29 DIAGNOSIS — M546 Pain in thoracic spine: Secondary | ICD-10-CM | POA: Insufficient documentation

## 2020-12-29 DIAGNOSIS — G8929 Other chronic pain: Secondary | ICD-10-CM | POA: Insufficient documentation

## 2020-12-29 DIAGNOSIS — M545 Low back pain, unspecified: Secondary | ICD-10-CM | POA: Diagnosis not present

## 2020-12-29 DIAGNOSIS — R293 Abnormal posture: Secondary | ICD-10-CM | POA: Insufficient documentation

## 2020-12-29 NOTE — Therapy (Signed)
[name] [address] [place], [address] Phone: [phone]   Fax:  [phone]  Physical Therapy Evaluation  Patient Details  Name: Daniel [name] MRN: [id number] Date of Birth: May 03, 1951 Referring Provider (PT): Jerrol Banana., MD  Encounter Date: 12/29/2020   PT End of Session - 12/30/20 1355     Visit Number 1    Number of Visits 24    Date for PT Re-Evaluation 03/23/21    Authorization Type [employer] reporting period from 12/29/2020    Progress Note Due on Visit 10    PT Start Time 1605    PT Stop Time 1655    PT Time Calculation (min) 50 min    Activity Tolerance Patient tolerated treatment well    Behavior During Therapy [facility] for tasks assessed/performed             Past Medical History:  Diagnosis Date   Actinic keratosis    Asthma    Eczema    Hx of dysplastic nevus 06/06/2006   Mid lower back, slight atypia   Hypothyroidism    Seasonal allergies    Thyroid disease     Past Surgical History:  Procedure Laterality Date   CATARACT EXTRACTION     COLON SURGERY     COLONOSCOPY WITH PROPOFOL N/A 11/18/2014   Procedure: COLONOSCOPY WITH PROPOFOL;  Surgeon: [name], MD;  Location: [facility];  Service: Endoscopy;  Laterality: N/A;   EYE SURGERY     KNEE SURGERY     NASAL SEPTUM SURGERY      There were no vitals filed for this visit.    Subjective Assessment - 12/29/20 1615     Subjective Patient states this episode of back pain started sometime back in the spring for no apparent reason. States it hurts across his low back but the longer he stands it moves up to the mid thoracic spine. Sometime his legs feel weak. Right this moment he knows it is there but it doesn't bother him too much. Has had pain down his R leg once or twice this episode (proximal to knee, anterior thigh). He walks 1-2 miles a day and has started wearing a back belt when he walks since this episode  of pain started (back belt is from job he had that required it in the past). Pain improves with this walking.  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 He started at 5am and they came to eat at 8:30am and by then he had to sit down every 5 min due to the pain behind his shoulders. He used to be 6'4" and now he is 6'2". He thought he was getting better last thrusday because it was not bothering him at all. Finished last predgnisone taper last Monday. Has a meeting in [place], [place] that takes 4 hours to get there and is not sure how his back will do. He has had a couple of back injuries in the 80s while lifting weights and then periodically his back has bothered him the right hip after that. He was lifting weights without a back belt in the 80s and he feels it must have been too heavy. He went to do some curls and felt it. It was bad enough that when he took steps with the right leg it would shake. He was on bed rest for 10 days back then. No surgeries. Reports  he felt it was all muscle related back then. For current pain, he does take some aspirin that helps up to a point. No other treatments besides those mentioned above. Until this past spring it has not bothered him in a long time. He has been using ice and heat and doing some PT exercises that he learned in the 80s when he had a muscle pull in his back. This includes prone on elbows for 7 min then half press ups in prone.    Pertinent History Patient is a 70 y.o. male who presents to outpatient physical therapy with a referral for medical diagnosis acute bilateral low back pain without sciatica. This patient's chief complaints consist of low back pain with radiation into R > L posterior glute and up to mid thoracic spine, leading to the following functional deficits: cooking for church, prolonged sitting, prolonged standing, yard work, Architectural technologist, sit <> stand, stairs, bending, lifting,  staining deck, shopping, walking, social participation.    Relevant past medical history and comorbidities include asthma, Zenker's (hypopharyngeal) diverticulum, GERD, fatty liver, hypothyroidism, BPH, OA, hx adeomatous polyp of colon, obesity, inguinal hernia, hx of dysplastic nevus, colon surgery (7 inches removed of colon), cataract extraction.  Patient denies hx of cancer, stroke, seizures, lung problem, major cardiac events, diabetes, unexplained weight loss, changes in bowel or bladder problems, new onset stumbling or dropping things, spinal surgery.    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   How long can you stand comfortably? 1-1.5 hours    Diagnostic tests Lumbar radiograph report 11/30/2020: "IMPRESSION:  1. Moderate multilevel lumbar spondylosis, progressed from prior.  2. Moderate-large volume of stool throughout the visualized colon."    Patient Stated Goals "to be able to stay on my feet as long as I need to without my back killing me"    Currently in Pain? Yes    Pain Score 1    W: 10/10; B: 0/10   Pain Location Back    Pain Orientation Right;Left    Pain Descriptors / Indicators Aching   achy on the hips and achy/burning/stabbing mid back   Pain Type Other (Comment)    Pain Radiating Towards can radiate to R or L hip, R more commonly and radiated down to anterior right thigh at one time. Does radiate to mid back with prolonged sitting or standing.    Pain Onset More than a month ago    Pain Frequency Intermittent    Aggravating Factors  prolonged standing, prolonged sitting (sometimes), bending (sometimes), lifting (sometimes), yardwork, twisting, prolonged activities, getting up/down from chair, going up stairs,    Pain Relieving Factors prednisone, heat, ice,  asprin, exercise, walking,    Effect of Pain on Daily Activities cooking for church, prolonged sitting, prolonged standing, yard work, gas operated Market researcher, sit <> stand, stairs, bending, lifting, staining deck, walking at times.                [facility] PT Assessment - 12/29/20 1544       Assessment   Medical Diagnosis acute bilateral low back pain without sciatica    Referring Provider (PT) Jerrol Banana., MD    Onset Date/Surgical Date --   spring 2022   Hand Dominance Right    Next MD Visit maybe december    Prior Therapy none for this  epsidoe of back pain      Precautions   Precautions None      Restrictions   Weight Bearing Restrictions No      Balance Screen   Has the patient fallen in the past 6 months No    Has the patient had a decrease in activity level because of a fear of falling?  No    Is the patient reluctant to leave their home because of a fear of falling?  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  Posture: stands with flattened lumbar spine and stooped posture.  Tremor: none Muscle bulk: generally overall mildly decreased consistent with lack of regular adequate exercise or physical activity.  Skin: The incision sites appear to be healing well with no excessive redness, warmth, drainage or signs of infection present.   Bed mobility: supine <> sit and rolling WFL Transfers: sit <> stand I  Gait: grossly WFL for household and short community ambulation with abnormally stooped posture. More  detailed gait analysis deferred to later date as needed.    NEUROLOGICAL  Upper Motor Neuron Screen Hoffman's, and Clonus (ankle) negative bilaterally Dermatomes L2-S2 appears equal and intact to light touch. Myotomes C2-T1 appears intact L2-S2 appears intact Deep Tendon Reflexes R/L  2+/2+ Quadriceps reflex (L4) 1+/1+ Achilles reflex (S1) Neurodynamic Tests   SLR negative bilaterally  SPINE MOTION Lumbar AROM *Indicates pain Flexion: = fingers to distal shin (normal range for him, consistent pain in right glute). . Extension: = 25% pain across low back, worse  Rotation: R = WFL right sided pain, L = WFL R sided pain  Side Flexion: R = 25% R sided pain, L = 25% slight R sided discomfort.   PERIPHERAL JOINT MOTION (in degrees) Passive Range of Motion (PROM) Comments:  12/29/2020: B hips WFL for basic mobility except some moderate R hip IR limitation and signficant B hip extension limitations.   MUSCLE PERFORMANCE (MMT):  *Indicates pain 12/29/20 Date Date  Joint/Motion R/L R/L R/L  Hip     Flexion 4+/4+ / /  Extension (knee ext) 4+/4+ / /  Abduction 5/4+ / /  Knee     Extension 5/5 / /  Flexion 5/5 / /  Ankle/Foot     Dorsiflexion  5/5 / /  Great toe extension 4+/4+ / /  Eversion 5/5 / /  Plantarflexion 4+/4+ / /  Comments:  12/29/2020: able to toe and heel walk with no UE support.   SUSTAINED POSITIONS TESTING:  Position Time During  After   Lying prone in ext. (Prone on elbows) 2 min Decreases low back pain No better  Comments:   REPEATED MOTIONS TESTING: Prone press up x10 produced pain on descent that improved with relaxation, very stiff, required cuing to relax back, no better/worse after.   SPECIAL TESTS: Straight leg raise (SLR): B = negative for concordant pain FABER: R = positive for posterior back pain (bilateral), L = positive  for left back pain.  ACCESSORY MOTION:  Hypomobile to CPA through thoracic and lumbar spine. Progressive discomfort with  descending lumbar segments.  Tender to CPA at sacrum.   PALPATION: TTP in B posterior hip/glute region, R > L.   EDUCATION/COGNITION: Patient is alert and oriented X 4.  Objective measurements completed on examination: See above findings.    TREATMENT:   Therapeutic exercise: to centralize symptoms and improve ROM, strength, muscular endurance, and activity tolerance required for successful completion of functional activities.  - sidelying open book thoracic rotation, 1x5 each side - Education on diagnosis, prognosis, POC, anatomy and physiology of current condition.  - Education on HEP including handout   Pt required multimodal cuing for proper technique and to facilitate improved neuromuscular control, strength, range of motion, and functional ability resulting in improved performance and form.  HOME EXERCISE PROGRAM Access Code: [email or web address] URL: [email or web address] Date: 12/30/2020 Prepared by: Rosita Kea  Exercises Sidelying Thoracic Rotation with Open Book - 1-2 x daily - 2 sets - 10 reps - 1 breath hold    PT Education - 12/30/20 1354     Education Details Exercise purpose/form. Self management techniques. Education on diagnosis, prognosis, POC, anatomy and physiology of current condition Education on HEP including handout    Person(s) Educated Patient    Methods Explanation;Demonstration;Tactile cues;Verbal cues;Handout    Comprehension Verbalized understanding;Returned demonstration;Verbal cues required;Tactile cues required;Need further instruction              PT Short Term Goals - 12/30/20 1413       PT SHORT TERM GOAL #1   Title Be independent with initial home exercise program for self-management of symptoms.    Baseline initial HEP provided at IE (12/29/2020);    Time 2    Period Weeks    Status New    Target Date 01/13/21               PT Long Term Goals - 12/30/20 1413       PT LONG TERM GOAL #1   Title Be independent with  a long-term home exercise program for self-management of symptoms.    Baseline Initial HEP provided at IE (12/29/2020);    Time 12    Period Weeks    Status New   TARGET DATE FOR ALL LONG TERM GOALS: 03/23/2021     PT LONG TERM GOAL #2   Title Demonstrate improved FOTO to equal or greater than 75 by visit #10 to demonstrate improvement in overall condition and self-reported functional ability.    Baseline 63 (12/29/2020);    Time 12    Period Weeks    Status New      PT LONG TERM GOAL #3   Title Patient will improve lumbar extension to equal or greater than 50% to improve upright posture for improved standing tolerance for cooking.    Baseline 25% (12/29/2020);    Time 12    Period Weeks    Status New      PT LONG TERM GOAL #4   Title Reduce pain with functional activities to equal or less than 1/10 to allow patient to complete usual activities including ADLs, IADLs, cooking, and social engagement with less difficulty.    Baseline 10/10 with prolonged standing (12/29/2020);    Time 12    Period Weeks    Status New      PT LONG TERM GOAL #5   Title Complete community, work and/or recreational activities without limitation due to current  condition.    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 male referred to outpatient physical therapy with a medical diagnosis of acute bilateral low back pain without sciatica who presents with signs and symptoms consistent with sub-acute episode of low back pain with referral to thoracic spine with history of radicular symptoms to the R LE on chronic episodic low back pain. Patient demonstrates  flattened lumbar lordosis and stooped posture that affects thoracic spine and posture and likely contributes to pain with prolonged standing. Patient also demonstrates significantly limited hip extension ROM that transfers abnormal load from lower extremities to low back. 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 Patient will benefit from skilled physical therapy intervention to address current body structure impairments and activity limitations to improve function and work towards goals set in current POC in order to return to prior level of function or maximal functional improvement.    Personal Factors and Comorbidities Time since onset of injury/illness/exacerbation;Past/Current Experience;Comorbidity 3+    Comorbidities Relevant past medical history and comorbidities include asthma, Zenker's (hypopharyngeal) diverticulum, GERD, fatty liver, hypothyroidism, BPH, OA, hx adeomatous polyp of colon, obesity, inguinal hernia, hx of dysplastic nevus, colon surgery (7 inches removed of colon), cataract extraction.    Examination-Activity Limitations Carry;Bend;Caring for Others;Lift;Stairs;Squat;Stand;Locomotion Level;Sit;Dressing;Transfers    Examination-Participation Restrictions Church;Community Activity;Meal Prep;Interpersonal Relationship;Yard Work    Stability/Clinical Decision Making Stable/Uncomplicated    Designer, jewellery Low    Rehab Potential Good    PT Frequency 2x / week    PT Duration 12 weeks    PT Treatment/Interventions ADLs/Self Care Home Management;Aquatic Therapy;Cryotherapy;Moist  Heat;Electrical Stimulation;Gait training;Stair training;Functional mobility training;Neuromuscular re-education;Therapeutic exercise;Therapeutic activities;Patient/family education;Manual techniques;Dry needling;Passive range of motion;Joint Manipulations;Spinal Manipulations    PT Next Visit Plan updated HEP as appropriate,    PT Home Exercise Plan Medbridge Access Code: [email or web address]    Consulted and Agree with Plan of Care Patient             Patient will benefit from skilled therapeutic intervention in order to improve the following deficits and impairments:  Improper body mechanics, Pain, Decreased mobility, Increased muscle spasms, Postural dysfunction, Decreased activity tolerance, Decreased endurance, Decreased range of motion, Decreased strength, Hypomobility, Impaired perceived functional ability, Difficulty walking  Visit Diagnosis: Chronic bilateral low back pain, unspecified whether sciatica present  Pain in thoracic spine  Abnormal posture     Problem List Patient Active Problem List   Diagnosis Date Noted   Zenker's (hypopharyngeal) diverticulum 03/22/2018   Gastroesophageal reflux disease 03/22/2018   Fatty liver 10/09/2017   BPH (benign prostatic hyperplasia) 11/21/2014   Hypothyroidism 11/21/2014   Obesity 11/21/2014   Asthma 11/21/2014   Inguinal hernia 11/21/2014   OA (osteoarthritis) 11/21/2014   H/O adenomatous polyp of colon 10/14/2014   Everlean Alstrom. Graylon Good, PT,  DPT 12/30/20, 2:19 PM   [facility] [address] [place], [address] Phone: [phone]   Fax:  [phone]  Name: NOLLIE DIPIRRO MRN: [id number] Date of Birth: Oct 07, 1950

## 2021-01-03 ENCOUNTER — Ambulatory Visit: Payer: Medicare Other | Admitting: Physical Therapy

## 2021-01-04 ENCOUNTER — Encounter: Payer: Self-pay | Admitting: Physical Therapy

## 2021-01-04 ENCOUNTER — Ambulatory Visit: Payer: Medicare Other | Admitting: Physical Therapy

## 2021-01-04 DIAGNOSIS — G8929 Other chronic pain: Secondary | ICD-10-CM | POA: Diagnosis not present

## 2021-01-04 DIAGNOSIS — M545 Low back pain, unspecified: Secondary | ICD-10-CM

## 2021-01-04 DIAGNOSIS — M546 Pain in thoracic spine: Secondary | ICD-10-CM

## 2021-01-04 DIAGNOSIS — R293 Abnormal posture: Secondary | ICD-10-CM

## 2021-01-04 NOTE — Therapy (Signed)
[facility] [address] [place], [address] Phone: [phone]   Fax:  [phone]  Physical Therapy Treatment  Patient Details  Name: Daniel [name] MRN: [id number] Date of Birth: 1951-04-08 Referring Provider (PT): Jerrol Banana., MD   Encounter Date: 01/04/2021   PT End of Session - 01/04/21 1542     Visit Number 2    Number of Visits 24    Date for PT Re-Evaluation 03/23/21    Authorization Type [employer] reporting period from 12/29/2020    Progress Note Due on Visit 10    PT Start Time 1537    PT Stop Time 1617    PT Time Calculation (min) 40 min    Activity Tolerance Patient tolerated treatment well    Behavior During Therapy [facility] for tasks assessed/performed             Past Medical History:  Diagnosis Date   Actinic keratosis    Asthma    Eczema    Hx of dysplastic nevus 06/06/2006   Mid lower back, slight atypia   Hypothyroidism    Seasonal allergies    Thyroid disease     Past Surgical History:  Procedure Laterality Date   CATARACT EXTRACTION     COLON SURGERY     COLONOSCOPY WITH PROPOFOL N/A 11/18/2014   Procedure: COLONOSCOPY WITH PROPOFOL;  Surgeon: [name], MD;  Location: [facility];  Service: Endoscopy;  Laterality: N/A;   EYE SURGERY     KNEE SURGERY     NASAL SEPTUM SURGERY      There were no vitals filed for this visit.   Subjective Assessment - 01/04/21 1539     Subjective Patient reports he is feeling better since last PT session and feels it is because he has been doing his open book exercises. He had a little pain this morning but currently his back just feels a little stiff. Wants to do some yardwork this Saturday if it cools down a bit like it is supposed to.    Pertinent History Patient is a 70 y.o. male who presents to outpatient physical therapy with a referral for medical diagnosis acute bilateral low back pain without sciatica. This patient's  chief complaints consist of low back pain with radiation into R > L posterior glute and up to mid thoracic spine, leading to the following functional deficits: cooking for church, prolonged sitting, prolonged standing, yard work, Architectural technologist, sit <> stand, stairs, bending, lifting, staining deck, shopping, walking, social participation.    Relevant past medical history and comorbidities include asthma, Zenker's (hypopharyngeal) diverticulum, GERD, fatty liver, hypothyroidism, BPH, OA, hx adeomatous polyp of colon, obesity, inguinal hernia, hx of dysplastic nevus, colon surgery (7 inches removed of colon), cataract extraction.  Patient denies hx of cancer, stroke, seizures, lung problem, major cardiac events, diabetes, unexplained weight loss, changes in bowel or bladder problems, new onset stumbling or dropping things, spinal surgery.    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   How long can you stand comfortably? 1-1.5 hours    Diagnostic tests Lumbar radiograph report 11/30/2020: "IMPRESSION:  1. Moderate multilevel lumbar spondylosis, progressed from prior.  2. Moderate-large volume of stool throughout the visualized colon."    Patient Stated Goals "to be able to stay on my feet  as long as I need to without my back killing me"    Currently in Pain? No/denies    Pain Onset More than a month ago             TREATMENT:    Therapeutic exercise: to centralize symptoms and improve ROM, strength, muscular endurance, and activity tolerance required for successful completion of functional activities.  - sidelying open book thoracic rotation, 4x10 each side  (Manual therapy - see below) - prone press up 2x10 - quadruped cat-cow, 1x20 - sit <> stand from low plinth  holding PVC stick overhead in max shoulder flexion, 2x10 (tired in B UE) - quadruped thoracic rotation with hand behind head, 1x10 each side - hooklying glute bridge with scapular retraction, 1x20 (cued for 5 seconds but patient holds for 1 second).  - Standing cervical thoracic extension/BUE flexion and serratus anterior activation, lat stretch, with foam roller up wall, 5 second holds, cuing for technique. 1x20. - wall slides Low Trap Setting at [place], 1x20 - door pec stretch, 3x30 seconds - Education on HEP including handout    Pt required multimodal cuing for proper technique and to facilitate improved neuromuscular control, strength, range of motion, and functional ability resulting in improved performance and form.  Manual therapy: to reduce pain and tissue tension, improve range of motion, neuromodulation, in order to promote improved ability to complete functional activities. PRONE (holding pillow under chest) - CPA grade III-IV at each segment of thoracic and lumbar spine to improve motion and decrease pain. ~ 10-20 reps at each level with more attention given to lower lumbar spine. (2 cavitations in thoracic spine).    HOME EXERCISE PROGRAM Access Code: [email or web address] URL: [email or web address] Date: 01/04/2021 Prepared by: Rosita Kea  Exercises Sidelying Thoracic Rotation with Open Book - 1-2 x daily - 2 sets - 10 reps - 1 breath hold Quadruped Thoracic Rotation Full Range with Hand on Neck - 1 x daily - 2 sets - 10 reps Low Trap Setting at [place] - 1 x daily - 2 sets - 10 reps Bridge - 1 x daily - 2 sets - 10 reps - 5 seconds hold Doorway Pec Stretch at 90 Degrees Abduction - 1 x daily - 3 reps - 30 hold   PT Education - 01/04/21 1542     Education Details Exercise purpose/form. Self management techniques    Person(s) Educated Patient    Methods Explanation;Demonstration;Tactile cues;Verbal cues    Comprehension Verbalized understanding;Returned demonstration;Verbal  cues required;Tactile cues required;Need further instruction              PT Short Term Goals - 12/30/20 1413       PT SHORT TERM GOAL #1   Title Be independent with initial home exercise program for self-management of symptoms.    Baseline initial HEP provided at IE (12/29/2020);    Time 2    Period Weeks    Status New    Target Date 01/13/21               PT Long Term Goals - 12/30/20 1413       PT LONG TERM GOAL #1   Title Be independent with a long-term home exercise program for self-management of symptoms.    Baseline Initial HEP provided at IE (12/29/2020);    Time 12    Period Weeks    Status New   TARGET DATE FOR ALL LONG TERM GOALS: 03/23/2021     PT LONG TERM GOAL #2  Title Demonstrate improved FOTO to equal or greater than 75 by visit #10 to demonstrate improvement in overall condition and self-reported functional ability.    Baseline 63 (12/29/2020);    Time 12    Period Weeks    Status New      PT LONG TERM GOAL #3   Title Patient will improve lumbar extension to equal or greater than 50% to improve upright posture for improved standing tolerance for cooking.    Baseline 25% (12/29/2020);    Time 12    Period Weeks    Status New      PT LONG TERM GOAL #4   Title Reduce pain with functional activities to equal or less than 1/10 to allow patient to complete usual activities including ADLs, IADLs, cooking, and social engagement with less difficulty.    Baseline 10/10 with prolonged standing (12/29/2020);    Time 12    Period Weeks    Status New      PT LONG TERM GOAL #5   Title Complete community, work and/or recreational activities without limitation due to current condition.    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, stairs, bending, lifting, staining deck, shopping, walking, social participation (12/29/2020);    Time 12     Period Weeks    Status New                   Plan - 01/04/21 1634     Clinical Impression Statement Patient tolerated treatment well overall and had no complaint of pain throughout session. Did report feeling a bit taller with better posture by end of session and was fatigued. Session focused on improving posture, spinal mobility, and functional strength. HEP updated to include new exercises and patient appeared enthusiastic about performing them at home. Would benefit from interventions to address hip extension ROM deficits next session. Patient would benefit from continued management of limiting condition by skilled physical therapist to address remaining impairments and functional limitations to work towards stated goals and return to PLOF or maximal functional independence.    Personal Factors and Comorbidities Time since onset of injury/illness/exacerbation;Past/Current Experience;Comorbidity 3+    Comorbidities Relevant past medical history and comorbidities include asthma, Zenker's (hypopharyngeal) diverticulum, GERD, fatty liver, hypothyroidism, BPH, OA, hx adeomatous polyp of colon, obesity, inguinal hernia, hx of dysplastic nevus, colon surgery (7 inches removed of colon), cataract extraction.    Examination-Activity Limitations Carry;Bend;Caring for Others;Lift;Stairs;Squat;Stand;Locomotion Level;Sit;Dressing;Transfers    Examination-Participation Restrictions Church;Community Activity;Meal Prep;Interpersonal Relationship;Yard Work    Stability/Clinical Decision Making Stable/Uncomplicated    Rehab Potential Good    PT Frequency 2x / week    PT Duration 12 weeks    PT Treatment/Interventions ADLs/Self Care Home Management;Aquatic Therapy;Cryotherapy;Moist Heat;Electrical Stimulation;Gait training;Stair training;Functional mobility training;Neuromuscular re-education;Therapeutic exercise;Therapeutic activities;Patient/family education;Manual techniques;Dry needling;Passive range of  motion;Joint Manipulations;Spinal Manipulations    PT Next Visit Plan spinal mobility, postural and glute strengthening, functional strengthening, hip extension ROM    PT Home Exercise Plan Medbridge Access Code: [email or web address]    Consulted and Agree with Plan of Care Patient             Patient will benefit from skilled therapeutic intervention in order to improve the following deficits and impairments:  Improper body mechanics, Pain, Decreased mobility, Increased muscle spasms, Postural dysfunction, Decreased activity tolerance, Decreased endurance, Decreased range of motion, Decreased strength, Hypomobility, Impaired perceived functional ability, Difficulty walking  Visit  Diagnosis: Chronic bilateral low back pain, unspecified whether sciatica present  Pain in thoracic spine  Abnormal posture     Problem List Patient Active Problem List   Diagnosis Date Noted   Zenker's (hypopharyngeal) diverticulum 03/22/2018   Gastroesophageal reflux disease 03/22/2018   Fatty liver 10/09/2017   BPH (benign prostatic hyperplasia) 11/21/2014   Hypothyroidism 11/21/2014   Obesity 11/21/2014   Asthma 11/21/2014   Inguinal hernia 11/21/2014   OA (osteoarthritis) 11/21/2014   H/O adenomatous polyp of colon 10/14/2014    Everlean Alstrom. Graylon Good, PT, DPT 01/04/21, 4:36 PM   [facility] [address] [place], [address] Phone: [phone]   Fax:  [phone]  Name: MAARTEN BAISCH MRN: [id number] Date of Birth: 27-Jan-1951

## 2021-01-05 ENCOUNTER — Ambulatory Visit: Payer: Medicare Other | Admitting: Physical Therapy

## 2021-01-06 ENCOUNTER — Ambulatory Visit: Payer: Medicare Other | Admitting: Physical Therapy

## 2021-01-10 ENCOUNTER — Encounter: Payer: Self-pay | Admitting: Physical Therapy

## 2021-01-10 ENCOUNTER — Ambulatory Visit: Payer: Medicare Other | Admitting: Physical Therapy

## 2021-01-10 ENCOUNTER — Encounter: Payer: Medicare Other | Admitting: Physical Therapy

## 2021-01-10 DIAGNOSIS — G8929 Other chronic pain: Secondary | ICD-10-CM

## 2021-01-10 DIAGNOSIS — R293 Abnormal posture: Secondary | ICD-10-CM | POA: Diagnosis not present

## 2021-01-10 DIAGNOSIS — M545 Low back pain, unspecified: Secondary | ICD-10-CM | POA: Diagnosis not present

## 2021-01-10 DIAGNOSIS — M546 Pain in thoracic spine: Secondary | ICD-10-CM
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
## 2021-01-12 ENCOUNTER — Ambulatory Visit: Payer: Medicare Other | Admitting: Physical Therapy

## 2021-01-12 ENCOUNTER — Encounter: Payer: Medicare Other | Admitting: Physical Therapy

## 2021-01-12 DIAGNOSIS — N401 Enlarged prostate with lower urinary tract symptoms: Secondary | ICD-10-CM | POA: Diagnosis not present

## 2021-01-12 DIAGNOSIS — N5201 Erectile dysfunction due to arterial insufficiency: Secondary | ICD-10-CM | POA: Diagnosis not present

## 2021-01-12 DIAGNOSIS — Z125 Encounter for screening for malignant neoplasm of prostate: Secondary | ICD-10-CM | POA: Diagnosis not present

## 2021-01-13 ENCOUNTER — Telehealth: Payer: Self-pay | Admitting: Family Medicine

## 2021-01-13 ENCOUNTER — Ambulatory Visit: Payer: Medicare Other

## 2021-01-13 DIAGNOSIS — M546 Pain in thoracic spine: Secondary | ICD-10-CM | POA: Diagnosis not present

## 2021-01-13 DIAGNOSIS — G8929 Other chronic pain: Secondary | ICD-10-CM | POA: Diagnosis not present

## 2021-01-13 DIAGNOSIS — M545 Low back pain, unspecified: Secondary | ICD-10-CM | POA: Diagnosis not present

## 2021-01-13 DIAGNOSIS — R293 Abnormal posture: Secondary | ICD-10-CM | POA: Diagnosis not present

## 2021-01-13 MED ORDER — PREDNISONE 10 MG PO TABS: 10.0000 mg | ORAL_TABLET | Freq: Every day | ORAL | 0 refills | Status: DC

## 2021-01-13 NOTE — Therapy (Signed)
[facility] [address] [place], [address] Phone: [phone]   Fax:  [phone]  Physical Therapy Treatment  Patient Details  Name: Daniel [name] MRN: [id number] Date of Birth: 14-Mar-1951 Referring Provider (PT): Jerrol Banana., MD   Encounter Date: 01/13/2021   PT End of Session - 01/13/21 1519     Visit Number 4    Number of Visits 24    Date for PT Re-Evaluation 03/23/21    Authorization Type [employer] reporting period from 12/29/2020    Progress Note Due on Visit 10    PT Start Time 1429    PT Stop Time [id number]    PT Time Calculation (min) 45 min    Activity Tolerance Patient tolerated treatment well    Behavior During Therapy [facility] for tasks assessed/performed             Past Medical History:  Diagnosis Date   Actinic keratosis    Asthma    Eczema    Hx of dysplastic nevus 06/06/2006   Mid lower back, slight atypia   Hypothyroidism    Seasonal allergies    Thyroid disease     Past Surgical History:  Procedure Laterality Date   CATARACT EXTRACTION     COLON SURGERY     COLONOSCOPY WITH PROPOFOL N/A 11/18/2014   Procedure: COLONOSCOPY WITH PROPOFOL;  Surgeon: [name], MD;  Location: [facility];  Service: Endoscopy;  Laterality: N/A;   EYE SURGERY     KNEE SURGERY     NASAL SEPTUM SURGERY      There were no vitals filed for this visit.   Subjective Assessment - 01/13/21 1431     Subjective Pt reports having a "really good day" but today his low back feels stiff and a little achey today. No medication changes or new acute events.    Pertinent History Patient is a 70 y.o. male who presents to outpatient physical therapy with a referral for medical diagnosis acute bilateral low back pain without sciatica. This patient's chief complaints consist of low back pain with radiation into R > L posterior glute and up to mid thoracic spine, leading to the following functional  deficits: cooking for church, prolonged sitting, prolonged standing, yard work, Architectural technologist, sit <> stand, stairs, bending, lifting, staining deck, shopping, walking, social participation.    Relevant past medical history and comorbidities include asthma, Zenker's (hypopharyngeal) diverticulum, GERD, fatty liver, hypothyroidism, BPH, OA, hx adeomatous polyp of colon, obesity, inguinal hernia, hx of dysplastic nevus, colon surgery (7 inches removed of colon), cataract extraction.  Patient denies hx of cancer, stroke, seizures, lung problem, major cardiac events, diabetes, unexplained weight loss, changes in bowel or bladder problems, new onset stumbling or dropping things, spinal surgery.    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   How long can you stand comfortably? 1-1.5 hours    Diagnostic tests Lumbar radiograph report 11/30/2020: "IMPRESSION:  1. Moderate multilevel lumbar spondylosis, progressed from prior.  2. Moderate-large volume of stool throughout the visualized colon."    Patient Stated Goals "to be able to stay on my feet as long as I need to without my back killing me"    Currently in Pain? Yes    Pain Score 3     Pain Location Back  Pain Onset More than a month ago             Manual Therapy with pt in prone: 10 min   Grade III (first bout) and IV (second bout) CPA joint mobs from L5-T1, 2x15 sec/segment for improve joint motion and joint nutrition in prep for short term window in improved ranges of mobility to utilize exercise to promote long term benefits.    There.ex:   Side lying thoracic open books: 2x15/side. Good form/technique   Prone press ups: 2x15 reps, x1 set with repeated push up. x1 set with 3-5 sec holds   Supine B glut bridge:  1x15. Progressed to single limb glut bridge: 2x10/LE. Noted LLE weakness > RLE with very limited hip clearance.   Seated lumbar flex with silver exercise ball: 2x10 forward, 2x10 R/L lat deviations.   Standing foam roller on wall B shoulder flexion to promote thoracic extension and stretch: 2x12 reps. VC's for improving BUE shoulder flexion to promote greater thoracic extension   Backwards walking in hallway to promote hip extension: CGA for safety: x3, 10'   TRX STS with standard height chair as TC for depth of LE squat: 2x10. Initial PT demo.   Pt reports 0/10 pain NPS post session. Pt required very minimal VC's for form/technique.    PT Short Term Goals - 12/30/20 1413       PT SHORT TERM GOAL #1   Title Be independent with initial home exercise program for self-management of symptoms.    Baseline initial HEP provided at IE (12/29/2020);    Time 2    Period Weeks    Status New    Target Date 01/13/21               PT Long Term Goals - 12/30/20 1413       PT LONG TERM GOAL #1   Title Be independent with a long-term home exercise program for self-management of symptoms.    Baseline Initial HEP provided at IE (12/29/2020);    Time 12    Period Weeks    Status New   TARGET DATE FOR ALL LONG TERM GOALS: 03/23/2021     PT LONG TERM GOAL #2   Title Demonstrate improved FOTO to equal or greater than 75 by visit #10 to demonstrate improvement in overall condition and self-reported functional ability.    Baseline 63 (12/29/2020);    Time 12    Period Weeks    Status New      PT LONG TERM GOAL #3   Title Patient will improve lumbar extension to equal or greater than 50% to improve upright posture for improved standing tolerance for cooking.    Baseline 25% (12/29/2020);    Time 12    Period Weeks    Status New      PT LONG TERM GOAL #4   Title Reduce pain with functional activities to equal or less than 1/10 to allow patient to complete usual activities including ADLs, IADLs,  cooking, and social engagement with less difficulty.    Baseline 10/10 with prolonged standing (12/29/2020);    Time 12    Period Weeks    Status New      PT LONG TERM GOAL #5   Title Complete community, work and/or recreational activities without limitation due to current condition.    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, stairs, bending, lifting,  staining deck, shopping, walking, social participation (12/29/2020);    Time 12    Period Weeks    Status New                   Plan - 01/13/21 1519     Clinical Impression Statement Continuing primary PT POC attempting to improve pt's standing posture by improving spinal mobility and strength. Pt demo'd good form/technique with exercise requiring min cuing with good carryover after cues. Pt tolerated new, additional exercises provided by covering PT to improve spinal mobility, LE strength, and hip extension mobility with no increase in pain. Pt will continue to benefit from skilled PT services to address remaining impairments/limitaitons to return to PLOF.    Personal Factors and Comorbidities Time since onset of injury/illness/exacerbation;Past/Current Experience;Comorbidity 3+    Comorbidities Relevant past medical history and comorbidities include asthma, Zenker's (hypopharyngeal) diverticulum, GERD, fatty liver, hypothyroidism, BPH, OA, hx adeomatous polyp of colon, obesity, inguinal hernia, hx of dysplastic nevus, colon surgery (7 inches removed of colon), cataract extraction.    Examination-Activity Limitations Carry;Bend;Caring for Others;Lift;Stairs;Squat;Stand;Locomotion Level;Sit;Dressing;Transfers    Examination-Participation Restrictions Church;Community Activity;Meal Prep;Interpersonal Relationship;Yard Work    Stability/Clinical Decision Making Stable/Uncomplicated    Rehab Potential Good    PT  Frequency 2x / week    PT Duration 12 weeks    PT Treatment/Interventions ADLs/Self Care Home Management;Aquatic Therapy;Cryotherapy;Moist Heat;Electrical Stimulation;Gait training;Stair training;Functional mobility training;Neuromuscular re-education;Therapeutic exercise;Therapeutic activities;Patient/family education;Manual techniques;Dry needling;Passive range of motion;Joint Manipulations;Spinal Manipulations    PT Next Visit Plan spinal mobility, postural and glute strengthening, functional strengthening, hip extension ROM    PT Home Exercise Plan Medbridge Access Code: [email or web address]    Consulted and Agree with Plan of Care Patient             Patient will benefit from skilled therapeutic intervention in order to improve the following deficits and impairments:  Improper body mechanics, Pain, Decreased mobility, Increased muscle spasms, Postural dysfunction, Decreased activity tolerance, Decreased endurance, Decreased range of motion, Decreased strength, Hypomobility, Impaired perceived functional ability, Difficulty walking  Visit Diagnosis: Chronic bilateral low back pain, unspecified whether sciatica present  Pain in thoracic spine  Abnormal posture     Problem List Patient Active Problem List   Diagnosis Date Noted   Zenker's (hypopharyngeal) diverticulum 03/22/2018   Gastroesophageal reflux disease 03/22/2018   Fatty liver 10/09/2017   BPH (benign prostatic hyperplasia) 11/21/2014   Hypothyroidism 11/21/2014   Obesity 11/21/2014   Asthma 11/21/2014   Inguinal hernia 11/21/2014   OA (osteoarthritis) 11/21/2014   H/O adenomatous polyp of colon 10/14/2014    Salem Caster. Fairly IV, PT, DPT Physical Therapist- [facility]  01/13/2021, 3:23 PM  [facility] [address] [place], [address] Phone: [phone]   Fax:  [phone]  Name: Daniel [name] MRN:  [id number] Date of Birth: 1951-01-23

## 2021-01-13 NOTE — Telephone Encounter (Signed)
RX sent pt advised.   Thanks,   -Mickel Baas

## 2021-01-13 NOTE — Telephone Encounter (Signed)
Patient is going out of town on 02/03/2021 and would like PCP to prescribe  9 prednisone tablets due to chronic back pain. Patient states PCP has prescribed in the past and it has helped. Patient states he will be doing a lot of walking. PCP next available appointment is 02/02/2021, patient scheduled but unsure if appointment is really needed, please advise patient directly.   [id number] - [place], [place] - [place] Phone:  [phone]  Fax:  [phone]

## 2021-01-17 ENCOUNTER — Ambulatory Visit: Payer: Medicare Other | Admitting: Physical Therapy

## 2021-01-17 ENCOUNTER — Encounter: Payer: Medicare Other | Admitting: Physical Therapy

## 2021-01-18 ENCOUNTER — Ambulatory Visit: Payer: Medicare Other | Admitting: Physical Therapy

## 2021-01-18 ENCOUNTER — Encounter: Payer: Self-pay | Admitting: Physical Therapy

## 2021-01-18 DIAGNOSIS — G8929 Other chronic pain: Secondary | ICD-10-CM

## 2021-01-18 DIAGNOSIS — M545 Low back pain, unspecified: Secondary | ICD-10-CM | POA: Diagnosis not present

## 2021-01-18 DIAGNOSIS — R293 Abnormal posture: Secondary | ICD-10-CM

## 2021-01-18 DIAGNOSIS — M546 Pain in thoracic spine: Secondary | ICD-10-CM | POA: Diagnosis not present

## 2021-01-18 NOTE — Therapy (Signed)
[facility] [address] [place], [address] Phone: [phone]   Fax:  [phone]  Physical Therapy Treatment  Patient Details  Name: Daniel [name] MRN: [id number] Date of Birth: 1951-02-27 Referring Provider (PT): Jerrol Banana., MD   Encounter Date: 01/18/2021   PT End of Session - 01/18/21 1644     Visit Number 5    Number of Visits 24    Date for PT Re-Evaluation 03/23/21    Authorization Type [employer] reporting period from 12/29/2020    Progress Note Due on Visit 10    PT Start Time 1552    PT Stop Time 1632    PT Time Calculation (min) 40 min    Activity Tolerance Patient tolerated treatment well    Behavior During Therapy [facility] for tasks assessed/performed             Past Medical History:  Diagnosis Date   Actinic keratosis    Asthma    Eczema    Hx of dysplastic nevus 06/06/2006   Mid lower back, slight atypia   Hypothyroidism    Seasonal allergies    Thyroid disease     Past Surgical History:  Procedure Laterality Date   CATARACT EXTRACTION     COLON SURGERY     COLONOSCOPY WITH PROPOFOL N/A 11/18/2014   Procedure: COLONOSCOPY WITH PROPOFOL;  Surgeon: [name], MD;  Location: [facility];  Service: Endoscopy;  Laterality: N/A;   EYE SURGERY     KNEE SURGERY     NASAL SEPTUM SURGERY      There were no vitals filed for this visit.   Subjective Assessment - 01/18/21 1616     Subjective Patient reports that he has been a lot better since last PT session. He states that he was able to drive and walk "a lot" on Saturday without an increase in pain. However, patient reports increased stiffness in upper back between shoulders that was present yesterday and currently. Patient reports that stiffness at 2/10 pain.    Pertinent History Patient is a 70 y.o. male who presents to outpatient physical therapy with a referral for medical diagnosis acute bilateral low back pain  without sciatica. This patient's chief complaints consist of low back pain with radiation into R > L posterior glute and up to mid thoracic spine, leading to the following functional deficits: cooking for church, prolonged sitting, prolonged standing, yard work, Architectural technologist, sit <> stand, stairs, bending, lifting, staining deck, shopping, walking, social participation.    Relevant past medical history and comorbidities include asthma, Zenker's (hypopharyngeal) diverticulum, GERD, fatty liver, hypothyroidism, BPH, OA, hx adeomatous polyp of colon, obesity, inguinal hernia, hx of dysplastic nevus, colon surgery (7 inches removed of colon), cataract extraction.  Patient denies hx of cancer, stroke, seizures, lung problem, major cardiac events, diabetes, unexplained weight loss, changes in bowel or bladder problems, new onset stumbling or dropping things, spinal surgery.    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   How long can you stand comfortably? 1-1.5 hours    Diagnostic tests Lumbar radiograph report 11/30/2020: "IMPRESSION:  1. Moderate multilevel lumbar spondylosis, progressed from prior.  2. Moderate-large volume of stool throughout the visualized colon."    Patient Stated Goals "to be able to stay on my feet as long  as I need to without my back killing me"    Currently in Pain? Yes    Pain Score 2     Pain Location Back    Pain Orientation Upper    Pain Onset More than a month ago             TREATMENT:  Manual Therapy: to increase joint mobility, support increased overall ROM, and provide pain relief to joint musculature.  - CPA grade III-IV with KE wedge from L5-T1, 2x15 sec/segment for improve joint motion and joint nutrition in prep for short term window  in improved ranges of mobility to utilize exercise to promote long term benefits.     Therapeutic exercise: to centralize symptoms and improve ROM, strength, muscular endurance, and activity tolerance required for successful completion of functional activities.   - Prone press ups: 2x15 reps, with 3-5 sec holds  - Side lying thoracic open books: 2x15/side.  - Supine single limb glute bridge: 3x10. Performed bilaterally.  - sit to stand from 18.5 inch plinth table with 5lb weight held at chest, 2x10 - Standing foam roller on wall B shoulder flexion to promote thoracic extension and stretch: 3x10 reps. - Backwards walking in hallway to promote hip extension: SBA for safety: x3, 10'  - seated thoracic extension, 3x10  - standing wall angels, 2x10  Pt required multimodal cuing for proper technique and to facilitate improved neuromuscular control, strength, range of motion, and functional ability resulting in improved performance and form.       PT Education - 01/18/21 1644     Education Details form/technique with exercises.    Person(s) Educated Patient    Methods Explanation;Demonstration;Tactile cues;Verbal cues    Comprehension Verbalized understanding;Returned demonstration;Verbal cues required;Tactile cues required              PT Short Term Goals - 01/19/21 1126       PT SHORT TERM GOAL #1   Title Be independent with initial home exercise program for self-management of symptoms.    Baseline initial HEP provided at IE (12/29/2020);    Time 2    Period Weeks    Status Achieved    Target Date 01/13/21               PT Long Term Goals - 12/30/20 1413       PT LONG TERM GOAL #1   Title Be independent with a long-term home exercise program for self-management of symptoms.    Baseline Initial HEP provided at IE (12/29/2020);    Time 12    Period Weeks    Status New   TARGET DATE FOR ALL LONG TERM GOALS: 03/23/2021     PT LONG TERM GOAL #2   Title Demonstrate  improved FOTO to equal or greater than 75 by visit #10 to demonstrate improvement in overall condition and self-reported functional ability.    Baseline 63 (12/29/2020);    Time 12    Period Weeks    Status New      PT LONG TERM GOAL #3   Title Patient will improve lumbar extension to equal or greater than 50% to improve upright posture for improved standing tolerance for cooking.    Baseline 25% (12/29/2020);    Time 12    Period Weeks    Status New      PT LONG TERM GOAL #4   Title Reduce pain with functional activities to equal or less than 1/10 to allow patient to complete usual  activities including ADLs, IADLs, cooking, and social engagement with less difficulty.    Baseline 10/10 with prolonged standing (12/29/2020);    Time 12    Period Weeks    Status New      PT LONG TERM GOAL #5   Title Complete community, work and/or recreational activities without limitation due to current condition.    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, stairs, bending, lifting, staining deck, shopping, walking, social participation (12/29/2020);    Time 12    Period Weeks    Status New                   Plan - 01/19/21 0912     Clinical Impression Statement Patient tolerated treatment well with report of decreased back tightness and symptoms by the end of the session Patient responded well to progression of LE/back strengthening exercises. However, patient continues to demonstrate poor postural alignment and strength. Plan to incorporate additional postural strengthening exercises at next session to improve patient back strength and self-management of symptoms. Patient will continue to benefit from skilled physical therapy intervention to address impairments and maximize functional mobility.    Personal Factors and Comorbidities Time since onset of  injury/illness/exacerbation;Past/Current Experience;Comorbidity 3+    Comorbidities Relevant past medical history and comorbidities include asthma, Zenker's (hypopharyngeal) diverticulum, GERD, fatty liver, hypothyroidism, BPH, OA, hx adeomatous polyp of colon, obesity, inguinal hernia, hx of dysplastic nevus, colon surgery (7 inches removed of colon), cataract extraction.    Examination-Activity Limitations Carry;Bend;Caring for Others;Lift;Stairs;Squat;Stand;Locomotion Level;Sit;Dressing;Transfers    Examination-Participation Restrictions Church;Community Activity;Meal Prep;Interpersonal Relationship;Yard Work    Stability/Clinical Decision Making Stable/Uncomplicated    Rehab Potential Good    PT Frequency 2x / week    PT Duration 12 weeks    PT Treatment/Interventions ADLs/Self Care Home Management;Aquatic Therapy;Cryotherapy;Moist Heat;Electrical Stimulation;Gait training;Stair training;Functional mobility training;Neuromuscular re-education;Therapeutic exercise;Therapeutic activities;Patient/family education;Manual techniques;Dry needling;Passive range of motion;Joint Manipulations;Spinal Manipulations    PT Next Visit Plan spinal mobility, postural and glute strengthening, functional strengthening, hip extension ROM    PT Home Exercise Plan Medbridge Access Code: [email or web address]    Consulted and Agree with Plan of Care Patient             Patient will benefit from skilled therapeutic intervention in order to improve the following deficits and impairments:  Improper body mechanics, Pain, Decreased mobility, Increased muscle spasms, Postural dysfunction, Decreased activity tolerance, Decreased endurance, Decreased range of motion, Decreased strength, Hypomobility, Impaired perceived functional ability, Difficulty walking  Visit Diagnosis: Chronic bilateral low back pain, unspecified whether sciatica present  Pain in thoracic spine  Abnormal posture     Problem List Patient Active  Problem List   Diagnosis Date Noted   Zenker's (hypopharyngeal) diverticulum 03/22/2018   Gastroesophageal reflux disease 03/22/2018   Fatty liver 10/09/2017   BPH (benign prostatic hyperplasia) 11/21/2014   Hypothyroidism 11/21/2014   Obesity 11/21/2014   Asthma 11/21/2014   Inguinal hernia 11/21/2014   OA (osteoarthritis) 11/21/2014   H/O adenomatous polyp of colon 10/14/2014    Sherryll Burger, SPT  Student physical therapist under direct supervision of licensed physical therapists during the entirety of the session.   Everlean Alstrom. Graylon Good, PT, DPT 01/19/21, 11:28 AM   [facility] [address] [place], [address] Phone: [phone]   Fax:  [phone]  Name: KARLON RUMBLEY MRN: [id number] Date of  Birth: 01/28/1951

## 2021-01-19 ENCOUNTER — Encounter: Payer: Medicare Other | Admitting: Physical Therapy

## 2021-01-20 ENCOUNTER — Encounter: Payer: Self-pay | Admitting: Physical Therapy

## 2021-01-20 ENCOUNTER — Ambulatory Visit: Payer: Medicare Other | Admitting: Physical Therapy

## 2021-01-20 ENCOUNTER — Other Ambulatory Visit: Payer: Self-pay

## 2021-01-20 DIAGNOSIS — M546 Pain in thoracic spine: Secondary | ICD-10-CM

## 2021-01-20 DIAGNOSIS — R293 Abnormal posture: Secondary | ICD-10-CM | POA: Diagnosis not present

## 2021-01-20 DIAGNOSIS — G8929 Other chronic pain: Secondary | ICD-10-CM

## 2021-01-20 DIAGNOSIS — M545 Low back pain, unspecified: Secondary | ICD-10-CM

## 2021-01-20 NOTE — Therapy (Signed)
[facility] [address] [place], [address] Phone: [phone]   Fax:  [phone]  Physical Therapy Treatment  Patient Details  Name: Daniel [name] MRN: [id number] Date of Birth: 12/27/1950 Referring Provider (PT): Jerrol Banana., MD   Encounter Date: 01/20/2021   PT End of Session - 01/20/21 1529     Visit Number 6    Number of Visits 24    Date for PT Re-Evaluation 03/23/21    Authorization Type [employer] reporting period from 12/29/2020    Progress Note Due on Visit 10    PT Start Time 1430    PT Stop Time 1510    PT Time Calculation (min) 40 min    Activity Tolerance Patient tolerated treatment well    Behavior During Therapy [facility] for tasks assessed/performed             Past Medical History:  Diagnosis Date   Actinic keratosis    Asthma    Eczema    Hx of dysplastic nevus 06/06/2006   Mid lower back, slight atypia   Hypothyroidism    Seasonal allergies    Thyroid disease     Past Surgical History:  Procedure Laterality Date   CATARACT EXTRACTION     COLON SURGERY     COLONOSCOPY WITH PROPOFOL N/A 11/18/2014   Procedure: COLONOSCOPY WITH PROPOFOL;  Surgeon: [name], MD;  Location: [facility];  Service: Endoscopy;  Laterality: N/A;   EYE SURGERY     KNEE SURGERY     NASAL SEPTUM SURGERY      There were no vitals filed for this visit.   Subjective Assessment - 01/20/21 1515     Subjective Patient reports feeling a little more tired today, but is doing well. Patient states that his back is fair. He states that he feels weeks and tight between the shoulder blades, but denies pain at begining of session. Patient reports R knee pain and states that single leg glute bridges in his HEP have bothered his knee.    Pertinent History Patient is a 70 y.o. male who presents to outpatient physical therapy with a referral for medical diagnosis acute bilateral low back pain without  sciatica. This patient's chief complaints consist of low back pain with radiation into R > L posterior glute and up to mid thoracic spine, leading to the following functional deficits: cooking for church, prolonged sitting, prolonged standing, yard work, Architectural technologist, sit <> stand, stairs, bending, lifting, staining deck, shopping, walking, social participation.    Relevant past medical history and comorbidities include asthma, Zenker's (hypopharyngeal) diverticulum, GERD, fatty liver, hypothyroidism, BPH, OA, hx adeomatous polyp of colon, obesity, inguinal hernia, hx of dysplastic nevus, colon surgery (7 inches removed of colon), cataract extraction.  Patient denies hx of cancer, stroke, seizures, lung problem, major cardiac events, diabetes, unexplained weight loss, changes in bowel or bladder problems, new onset stumbling or dropping things, spinal surgery.    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   How long can you stand comfortably? 1-1.5 hours    Diagnostic tests Lumbar radiograph report 11/30/2020: "IMPRESSION:  1. Moderate multilevel lumbar spondylosis, progressed from prior.  2. Moderate-large volume of stool throughout the visualized colon."    Patient Stated Goals "to be able to stay on my feet  as long as I need to without my back killing me"    Currently in Pain? No/denies    Pain Score 0-No pain    Pain Onset More than a month ago              TREATMENT:  Manual Therapy: to increase joint mobility, support increased overall ROM, and provide pain relief to joint musculature.  - CPA grade III-IV with KE wedge from L5-T1, 1x15 sec/segment for improve joint motion and joint nutrition in prep for short term window in improved ranges of mobility to utilize  exercise to promote long term benefits.     Therapeutic exercise: to centralize symptoms and improve ROM, strength, muscular endurance, and activity tolerance required for successful completion of functional activities.   - Prone press ups: 2x15 reps, with 3-5 sec holds  - Side lying thoracic open books: 2x15/side.  - standing pallof press with green theraband, 3x12 performed bilaterally - sit to stand from 18.5 inch plinth table with 7lb weight held at chest, 1x10. Discontinued due to R knee pain.  - seated thoracic extension, 3x10 - standing rows with15lb cable, 3x10   - lat pull down with cable, 1x10 with 15lb cable, 2x10 with 20lb cable. - Seated lumbar flexion with silver exercise ball: 3x10 forward, 2x10 R/L lat deviations.   - standing wall angels, 4x10  - Seated lumbar flex with silver exercise ball: 3x10 forward, 2x10 R/L lat deviations. - Standing foam roller on wall B shoulder flexion to promote thoracic extension and stretch: 3x10 reps.   Pt required multimodal cuing for proper technique and to facilitate improved neuromuscular control, strength, range of motion, and functional ability resulting in improved performance and form.   HOME EXERCISE PROGRAM Access Code: [email or web address] URL: [email or web address] Date: 01/04/2021 Prepared by: Rosita Kea   Exercises Sidelying Thoracic Rotation with Open Book - 1-2 x daily - 2 sets - 10 reps - 1 breath hold Quadruped Thoracic Rotation Full Range with Hand on Neck - 1 x daily - 2 sets - 10 reps Low Trap Setting at [place] - 1 x daily - 2 sets - 10 reps Bridge - 1 x daily - 2 sets - 10 reps - 5 seconds hold Doorway Pec Stretch at 90 Degrees Abduction - 1 x daily - 3 reps - 30 hold        PT Education - 01/20/21 1517     Education Details form/technique with exercises.    Person(s) Educated Patient    Methods Explanation;Demonstration;Tactile cues;Verbal cues    Comprehension Returned demonstration;Verbalized  understanding;Verbal cues required;Tactile cues required              PT Short Term Goals - 01/19/21 1126       PT SHORT TERM GOAL #1   Title Be independent with initial home exercise program for self-management of symptoms.    Baseline initial HEP provided at IE (12/29/2020);    Time 2    Period Weeks    Status Achieved    Target Date 01/13/21               PT Long Term Goals - 12/30/20 1413       PT LONG TERM GOAL #1   Title Be independent with a long-term home exercise program for self-management of symptoms.    Baseline Initial HEP provided at IE (12/29/2020);    Time 12    Period Weeks    Status New   TARGET DATE FOR ALL LONG TERM  GOALS: 03/23/2021     PT LONG TERM GOAL #2   Title Demonstrate improved FOTO to equal or greater than 75 by visit #10 to demonstrate improvement in overall condition and self-reported functional ability.    Baseline 63 (12/29/2020);    Time 12    Period Weeks    Status New      PT LONG TERM GOAL #3   Title Patient will improve lumbar extension to equal or greater than 50% to improve upright posture for improved standing tolerance for cooking.    Baseline 25% (12/29/2020);    Time 12    Period Weeks    Status New      PT LONG TERM GOAL #4   Title Reduce pain with functional activities to equal or less than 1/10 to allow patient to complete usual activities including ADLs, IADLs, cooking, and social engagement with less difficulty.    Baseline 10/10 with prolonged standing (12/29/2020);    Time 12    Period Weeks    Status New      PT LONG TERM GOAL #5   Title Complete community, work and/or recreational activities without limitation due to current condition.    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, stairs, bending, lifting, staining deck, shopping, walking, social participation (12/29/2020);    Time 12     Period Weeks    Status New                   Plan - 01/20/21 1530     Clinical Impression Statement Patient tolerated treatment well. Session incorporated postural strengthening since patient continues to demonstrate impaired postural strength and thoracic mobility that likely contributes to back pain. Patient postural muscle fatigue prevent further progression of therapeutic exercise and patient requires multimodal cues to properly retract shoulder blades without compensation. Patient will continue to benefit from skilled physical therapy to address impairments and maximize functional mobility.    Personal Factors and Comorbidities Time since onset of injury/illness/exacerbation;Past/Current Experience;Comorbidity 3+    Comorbidities Relevant past medical history and comorbidities include asthma, Zenker's (hypopharyngeal) diverticulum, GERD, fatty liver, hypothyroidism, BPH, OA, hx adeomatous polyp of colon, obesity, inguinal hernia, hx of dysplastic nevus, colon surgery (7 inches removed of colon), cataract extraction.    Examination-Activity Limitations Carry;Bend;Caring for Others;Lift;Stairs;Squat;Stand;Locomotion Level;Sit;Dressing;Transfers    Examination-Participation Restrictions Church;Community Activity;Meal Prep;Interpersonal Relationship;Yard Work    Stability/Clinical Decision Making Stable/Uncomplicated    Rehab Potential Good    PT Frequency 2x / week    PT Duration 12 weeks    PT Treatment/Interventions ADLs/Self Care Home Management;Aquatic Therapy;Cryotherapy;Moist Heat;Electrical Stimulation;Gait training;Stair training;Functional mobility training;Neuromuscular re-education;Therapeutic exercise;Therapeutic activities;Patient/family education;Manual techniques;Dry needling;Passive range of motion;Joint Manipulations;Spinal Manipulations    PT Next Visit Plan spinal mobility, postural and glute strengthening, functional strengthening, hip extension ROM    PT Home  Exercise Plan Medbridge Access Code: [email or web address]    Consulted and Agree with Plan of Care Patient             Patient will benefit from skilled therapeutic intervention in order to improve the following deficits and impairments:  Improper body mechanics, Pain, Decreased mobility, Increased muscle spasms, Postural dysfunction, Decreased activity tolerance, Decreased endurance, Decreased range of motion, Decreased strength, Hypomobility, Impaired perceived functional ability, Difficulty walking  Visit Diagnosis: Chronic bilateral low back pain, unspecified whether sciatica present  Pain in thoracic spine  Abnormal posture     Problem  List Patient Active Problem List   Diagnosis Date Noted   Zenker's (hypopharyngeal) diverticulum 03/22/2018   Gastroesophageal reflux disease 03/22/2018   Fatty liver 10/09/2017   BPH (benign prostatic hyperplasia) 11/21/2014   Hypothyroidism 11/21/2014   Obesity 11/21/2014   Asthma 11/21/2014   Inguinal hernia 11/21/2014   OA (osteoarthritis) 11/21/2014   H/O adenomatous polyp of colon 10/14/2014   Sherryll Burger, SPT  Student physical therapist under direct supervision of licensed physical therapists during the entirety of the session.   Everlean Alstrom. Graylon Good, PT, DPT 01/20/21, 4:02 PM   [facility] [address] [place], [address] Phone: [phone]   Fax:  [phone]  Name: ISAMI TORO MRN: [id number] Date of Birth: 1950-06-02

## 2021-01-25 ENCOUNTER — Ambulatory Visit: Payer: Medicare Other | Admitting: Physical Therapy

## 2021-01-27 ENCOUNTER — Ambulatory Visit: Payer: Medicare Other | Attending: Family Medicine

## 2021-01-27 DIAGNOSIS — R293 Abnormal posture: Secondary | ICD-10-CM | POA: Diagnosis not present

## 2021-01-27 DIAGNOSIS — G8929 Other chronic pain: Secondary | ICD-10-CM | POA: Diagnosis not present

## 2021-01-27 DIAGNOSIS — M545 Low back pain, unspecified: Secondary | ICD-10-CM | POA: Insufficient documentation

## 2021-01-27 DIAGNOSIS — M546 Pain in thoracic spine: Secondary | ICD-10-CM | POA: Diagnosis not present

## 2021-01-27 NOTE — Therapy (Signed)
[facility] [address] [place], [address] Phone: [phone]   Fax:  [phone]  Physical Therapy Treatment  Patient Details  Name: Daniel [name] MRN: [id number] Date of Birth: 07-05-1950 Referring Provider (PT): Jerrol Banana., MD   Encounter Date: 01/27/2021   PT End of Session - 01/27/21 1440     Visit Number 7    Number of Visits 24    Date for PT Re-Evaluation 03/23/21    Authorization Type [employer] reporting period from 12/29/2020    Progress Note Due on Visit 10    PT Start Time 1429    PT Stop Time 1510    PT Time Calculation (min) 41 min    Activity Tolerance Patient tolerated treatment well    Behavior During Therapy [facility] for tasks assessed/performed             Past Medical History:  Diagnosis Date   Actinic keratosis    Asthma    Eczema    Hx of dysplastic nevus 06/06/2006   Mid lower back, slight atypia   Hypothyroidism    Seasonal allergies    Thyroid disease     Past Surgical History:  Procedure Laterality Date   CATARACT EXTRACTION     COLON SURGERY     COLONOSCOPY WITH PROPOFOL N/A 11/18/2014   Procedure: COLONOSCOPY WITH PROPOFOL;  Surgeon: [name], MD;  Location: [facility];  Service: Endoscopy;  Laterality: N/A;   EYE SURGERY     KNEE SURGERY     NASAL SEPTUM SURGERY      There were no vitals filed for this visit.   Subjective Assessment - 01/27/21 1430     Subjective Pt reports minor LBP today after sitting in a chair with little support but describes more of a stiffness/weakness than pain.    Pertinent History Patient is a 70 y.o. male who presents to outpatient physical therapy with a referral for medical diagnosis acute bilateral low back pain without sciatica. This patient's chief complaints consist of low back pain with radiation into R > L posterior glute and up to mid thoracic spine, leading to the following functional deficits: cooking for  church, prolonged sitting, prolonged standing, yard work, Architectural technologist, sit <> stand, stairs, bending, lifting, staining deck, shopping, walking, social participation.    Relevant past medical history and comorbidities include asthma, Zenker's (hypopharyngeal) diverticulum, GERD, fatty liver, hypothyroidism, BPH, OA, hx adeomatous polyp of colon, obesity, inguinal hernia, hx of dysplastic nevus, colon surgery (7 inches removed of colon), cataract extraction.  Patient denies hx of cancer, stroke, seizures, lung problem, major cardiac events, diabetes, unexplained weight loss, changes in bowel or bladder problems, new onset stumbling or dropping things, spinal surgery.    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   How long can you stand comfortably? 1-1.5 hours    Diagnostic tests Lumbar radiograph report 11/30/2020: "IMPRESSION:  1. Moderate multilevel lumbar spondylosis, progressed from prior.  2. Moderate-large volume of stool throughout the visualized colon."    Patient Stated Goals "to be able to stay on my feet as long as I need to without my back killing me"    Currently in Pain? No/denies    Pain Onset More than a month ago  Manual Therapy: CPA grade III-IV from T1-T12, 1x15 sec/segment for improve joint motion and joint nutrition in prep for short term window in improved ranges of mobility to utilize exercise to promote long term benefits.   CPA grade II CPA's at concordant T3-T4 segments in attempts to decrease mid back pain reproduced. No change in symptoms.    There.Ex:   Side lying thoracic open books: 2x15/side  Standing wall angels: 3x10, limited shoulder AROM slightly > 90 deg  Seated lumbar flex with silver exercise ball: 2x10 forward,  2x10 R/L lat deviations  Standing rows with cable, 1x10, 15 lbs. 2x10, 20 lbs  Lat pull down with cable, 1x15 with 20 lbs cable, 3x10, 45 lbs cable. VC's for slowing down speed of concentric phase of exercise. Otherwise good form.  Standing, alternating punches for scap protraction strengthening: 3# DB's, 2x10/UE, 1x12/UE. Inclined push-up: 3x10 reps   PT Education - 01/27/21 1439     Education Details form/technique with exercises.    Person(s) Educated Patient    Methods Explanation;Demonstration;Tactile cues;Verbal cues    Comprehension Verbalized understanding;Returned demonstration;Verbal cues required;Tactile cues required              PT Short Term Goals - 01/19/21 1126       PT SHORT TERM GOAL #1   Title Be independent with initial home exercise program for self-management of symptoms.    Baseline initial HEP provided at IE (12/29/2020);    Time 2    Period Weeks    Status Achieved    Target Date 01/13/21               PT Long Term Goals - 12/30/20 1413       PT LONG TERM GOAL #1   Title Be independent with a long-term home exercise program for self-management of symptoms.    Baseline Initial HEP provided at IE (12/29/2020);    Time 12    Period Weeks    Status New   TARGET DATE FOR ALL LONG TERM GOALS: 03/23/2021     PT LONG TERM GOAL #2   Title Demonstrate improved FOTO to equal or greater than 75 by visit #10 to demonstrate improvement in overall condition and self-reported functional ability.    Baseline 63 (12/29/2020);    Time 12    Period Weeks    Status New      PT LONG TERM GOAL #3   Title Patient will improve lumbar extension to equal or greater than 50% to improve upright posture for improved standing tolerance for cooking.    Baseline 25% (12/29/2020);    Time 12    Period Weeks    Status New      PT LONG TERM GOAL #4   Title Reduce pain with functional activities to equal or less than 1/10 to allow patient to complete usual activities  including ADLs, IADLs, cooking, and social engagement with less difficulty.    Baseline 10/10 with prolonged standing (12/29/2020);    Time 12    Period Weeks    Status New      PT LONG TERM GOAL #5   Title Complete community, work and/or recreational activities without limitation due to current condition.    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, stairs, bending, lifting, staining deck, shopping, walking, social participation (12/29/2020);    Time 12    Period Weeks    Status  New                   Plan - 01/27/21 1516     Clinical Impression Statement Continuing primary PT POC with focus on postural strengthening and use of manual techniques to improve joint ROM and reduce pain and stiffness. Pt continues to fatigue quickly requiring frequent rest breaks throughout session due to postural weakness, however no increase in pain. Today with CPA's at T3-T4 pt did report concordant symptoms with Grade 2 and Grade 3 mobilizations that did not ease with repeated motions. Overall pt tolerated an increase in resistance with postural exercises. Pt will continue to benefit from skilled PT interventoin ro address impairments and maximize functional mobility.    Personal Factors and Comorbidities Time since onset of injury/illness/exacerbation;Past/Current Experience;Comorbidity 3+    Comorbidities Relevant past medical history and comorbidities include asthma, Zenker's (hypopharyngeal) diverticulum, GERD, fatty liver, hypothyroidism, BPH, OA, hx adeomatous polyp of colon, obesity, inguinal hernia, hx of dysplastic nevus, colon surgery (7 inches removed of colon), cataract extraction.    Examination-Activity Limitations Carry;Bend;Caring for Others;Lift;Stairs;Squat;Stand;Locomotion Level;Sit;Dressing;Transfers    Examination-Participation Restrictions Church;Community  Activity;Meal Prep;Interpersonal Relationship;Yard Work    Stability/Clinical Decision Making Stable/Uncomplicated    Rehab Potential Good    PT Frequency 2x / week    PT Duration 12 weeks    PT Treatment/Interventions ADLs/Self Care Home Management;Aquatic Therapy;Cryotherapy;Moist Heat;Electrical Stimulation;Gait training;Stair training;Functional mobility training;Neuromuscular re-education;Therapeutic exercise;Therapeutic activities;Patient/family education;Manual techniques;Dry needling;Passive range of motion;Joint Manipulations;Spinal Manipulations    PT Next Visit Plan spinal mobility, postural and glute strengthening, functional strengthening, hip extension ROM    PT Home Exercise Plan Medbridge Access Code: [email or web address]    Consulted and Agree with Plan of Care Patient             Patient will benefit from skilled therapeutic intervention in order to improve the following deficits and impairments:  Improper body mechanics, Pain, Decreased mobility, Increased muscle spasms, Postural dysfunction, Decreased activity tolerance, Decreased endurance, Decreased range of motion, Decreased strength, Hypomobility, Impaired perceived functional ability, Difficulty walking  Visit Diagnosis: Chronic bilateral low back pain, unspecified whether sciatica present  Pain in thoracic spine  Abnormal posture     Problem List Patient Active Problem List   Diagnosis Date Noted   Zenker's (hypopharyngeal) diverticulum 03/22/2018   Gastroesophageal reflux disease 03/22/2018   Fatty liver 10/09/2017   BPH (benign prostatic hyperplasia) 11/21/2014   Hypothyroidism 11/21/2014   Obesity 11/21/2014   Asthma 11/21/2014   Inguinal hernia 11/21/2014   OA (osteoarthritis) 11/21/2014   H/O adenomatous polyp of colon 10/14/2014    Salem Caster. Fairly IV, PT, DPT Physical Therapist- [facility]  01/27/2021, 3:22 PM  [facility] [address] [place], [address] Phone: [phone]   Fax:  [phone]  Name: CABOT WINDHORST MRN: [id number] Date of Birth: 10-11-1950

## 2021-02-01 ENCOUNTER — Ambulatory Visit: Payer: Medicare Other | Admitting: Physical Therapy

## 2021-02-02 ENCOUNTER — Ambulatory Visit: Payer: Self-pay | Admitting: *Deleted

## 2021-02-02 ENCOUNTER — Telehealth: Payer: Self-pay

## 2021-02-02 ENCOUNTER — Ambulatory Visit: Payer: Medicare Other | Admitting: Family Medicine

## 2021-02-02 ENCOUNTER — Ambulatory Visit: Payer: Medicare Other | Admitting: Physical Therapy

## 2021-02-02 MED ORDER — NIRMATRELVIR/RITONAVIR (PAXLOVID)TABLET: 3.0000 | ORAL_TABLET | Freq: Two times a day (BID) | ORAL | 0 refills | Status: AC

## 2021-02-02 NOTE — Telephone Encounter (Signed)
  Pt was reading the paperwork and it says to not take if liver disease. Pt has called after 5:00, office closed. He wants to know if he should or should not . I see where they have noted his GFR so assuming ok? But only relayed to pt that Nurse would FU.Call [phone]  Pt states concerned as Paxlovid prescribed for pt notes to notify MD if kidney or liver problems. Advised pt GFR noted and med authorized by  Dr. [name]. Pt states he has both and has not started meds. Would like to hear from Dr. [name] before starting meds.  CB# [id number]        Reason for Disposition  Prescription request for new medicine (not a refill)    Med question  Answer Assessment - Initial Assessment Questions 1. NAME of MEDICATION: "What medicine are you calling about?"     Paxlovid 2. QUESTION: "What is your question?" (e.g., double dose of medicine, side effect)     *No Answer* 3. PRESCRIBING HCP: "Who prescribed it?" Reason: if prescribed by specialist, call should be referred to that group.     *No Answer* 4. SYMPTOMS: "Do you have any symptoms?"     *No Answer* 5. SEVERITY: If symptoms are present, ask "Are they mild, moderate or severe?"     *No Answer* 6. PREGNANCY:  "Is there any chance that you are pregnant?" "When was your last menstrual period?"     *No Answer*  Protocols used: Medication Question Call-A-AH

## 2021-02-02 NOTE — Addendum Note (Signed)
Addended by: Wilburt Finlay on: 02/02/2021 02:27 PM   Modules accepted: Orders

## 2021-02-02 NOTE — Telephone Encounter (Signed)
Copied from [place] [id number]. Topic: General - Inquiry >> Feb 02, 2021  8:44 AM Loma [place] wrote:  Pt calling wanting to speak with Dr [name] or nurse, concerned as tested positive for Covid, twice tested to make sure. Since yesterday has had Runny nose, coughing. Mucus build up, fever yesterday 99.9 and today/ due to age inquiring anti virals possible. Wants FU as knows time frame please call pt to let him know [phone]

## 2021-02-02 NOTE — Telephone Encounter (Signed)
Please advise 

## 2021-02-02 NOTE — Telephone Encounter (Signed)
Medication sent into the pharmacy. Patient advised.

## 2021-02-03 ENCOUNTER — Encounter: Payer: Medicare Other | Admitting: Physical Therapy

## 2021-02-03 NOTE — Telephone Encounter (Signed)
Advised patient as below.  

## 2021-02-03 NOTE — Telephone Encounter (Signed)
Please advise. Patient GFR was 76. Ok for him to take paxlovid?

## 2021-02-08 ENCOUNTER — Ambulatory Visit: Payer: Medicare Other | Admitting: Physical Therapy

## 2021-02-09 ENCOUNTER — Encounter: Payer: Medicare Other | Admitting: Physical Therapy

## 2021-02-10 ENCOUNTER — Encounter: Payer: Medicare Other | Admitting: Physical Therapy

## 2021-02-15 ENCOUNTER — Ambulatory Visit: Payer: Medicare Other | Admitting: Physical Therapy

## 2021-02-15 DIAGNOSIS — G8929 Other chronic pain: Secondary | ICD-10-CM

## 2021-02-15 DIAGNOSIS — M546 Pain in thoracic spine: Secondary | ICD-10-CM

## 2021-02-15 DIAGNOSIS — M545 Low back pain, unspecified: Secondary | ICD-10-CM | POA: Diagnosis not present

## 2021-02-15 DIAGNOSIS — R293 Abnormal posture: Secondary | ICD-10-CM

## 2021-02-15 NOTE — Therapy (Signed)
[facility] [address], [place], [address] Phone: [phone]   Fax:  [phone]  Physical Therapy Treatment  Patient Details  Name: Daniel [name] MRN: [id number] Date of Birth: [birth year] Referring Provider (PT): Jerrol Banana., MD   Encounter Date: 02/15/2021   PT End of Session - 02/15/21 1439     Visit Number 8    Number of Visits 24    Date for PT Re-Evaluation 03/23/21    Authorization Type [employer] reporting period from 12/29/2020    Progress Note Due on Visit 10    PT Start Time 1430    PT Stop Time 1510    PT Time Calculation (min) 40 min    Activity Tolerance Patient tolerated treatment well    Behavior During Therapy [facility] for tasks assessed/performed             Past Medical History:  Diagnosis Date   Actinic keratosis    Asthma    Eczema    Hx of dysplastic nevus 06/06/2006   Mid lower back, slight atypia   Hypothyroidism    Seasonal allergies    Thyroid disease     Past Surgical History:  Procedure Laterality Date   CATARACT EXTRACTION     COLON SURGERY     COLONOSCOPY WITH PROPOFOL N/A 11/18/2014   Procedure: COLONOSCOPY WITH PROPOFOL;  Surgeon: [name], MD;  Location: [facility];  Service: Endoscopy;  Laterality: N/A;   EYE SURGERY     KNEE SURGERY     NASAL SEPTUM SURGERY      There were no vitals filed for this visit.   Subjective Assessment - 02/15/21 1433     Subjective Patient reports he was unable to come to PT recently because he had [place]. He has recovered but he now has right knee pain that came on for no apparent reason about 2-3 weeks ago. He is seeing Dr. [name] about it tomorrow. Reports right knee pain is 2-3/10 at rest and 5-6/10 when walking on it. He had to sit down after walking a couple of blocks even with a knee brace. It also hurts when he gets up. Feels unstable like it might collapse. States his back is doing okay more or less.  It is better but not all the way better. He has been doing his HEP for his back. Rates his pain there 0/10 sitting but it continues to bother him when he stands up too long. Still feels like it pulls him forwards. States he feels like he recovered from [place] okay.    Pertinent History Patient is a 70 y.o. male who presents to outpatient physical therapy with a referral for medical diagnosis acute bilateral low back pain without sciatica. This patient's chief complaints consist of low back pain with radiation into R > L posterior glute and up to mid thoracic spine, leading to the following functional deficits: cooking for church, prolonged sitting, prolonged standing, yard work, Architectural technologist, sit <> stand, stairs, bending, lifting, staining deck, shopping, walking, social participation.    Relevant past medical history and comorbidities include asthma, Zenker's (hypopharyngeal) diverticulum, GERD, fatty liver, hypothyroidism, BPH, OA, hx adeomatous polyp of colon, obesity, inguinal hernia, hx of dysplastic nevus, colon surgery (7 inches removed of colon), cataract extraction.  Patient denies hx of cancer, stroke, seizures, lung problem, major cardiac events, diabetes, unexplained weight loss, changes in bowel or bladder problems, new onset stumbling or dropping  things, spinal surgery.    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   How long can you stand comfortably? 1-1.5 hours    Diagnostic tests Lumbar radiograph report 11/30/2020: "IMPRESSION:  1. Moderate multilevel lumbar spondylosis, progressed from prior.  2. Moderate-large volume of stool throughout the visualized colon."    Patient Stated Goals "to be able to stay on my feet as long as I need to without my back killing me"     Currently in Pain? Yes    Pain Score 6     Pain Location Knee    Pain Orientation Right;Anterior    Pain Onset More than a month ago              TREATMENT:   Manual therapy: to reduce pain and tissue tension, improve range of motion, neuromodulation, in order to promote improved ability to complete functional activities. PRONE - CPA grade III-IV with KE wedge from T3-L5, 1x10-20 reps/segment for improve joint motion and joint nutrition improved ranges of mobility.    Therapeutic exercise: to centralize symptoms and improve ROM, strength, muscular endurance, and activity tolerance required for successful completion of functional activities.   - Side lying thoracic open books: 2x20/side (Manual therapy - see above) - Prone press ups: 2x15 reps, with 3-5 sec holds  - standing doorway pec stretch, 3x30 seconds (got lightheaded first attempt and had to sit for a short period - HR and SpO2 WFL).  - Standing wall angels: 3x10, limited shoulder AROM slightly > 90 deg - Standing rows with cable, 1x10, 20 lbs  - standing Y lift offs facing the wall, 1x7 each side alternating, then B 3x10 (needed sitting break before last two sets).  - seated wide rows facing chest press machine, 2x10 with 20# cable.  - Lat pull down to chest with cable, 3x10, 45 lbs cable. VC's for posture.  - standing B shoulder extension with GTB anchored overhead, 3x10. Tactile cues for improved posture and form.   Pt required multimodal cuing for proper technique and to facilitate improved neuromuscular control, strength, range of motion, and functional ability resulting in improved performance and form.    HOME EXERCISE PROGRAM Access Code: [email or web address] URL: [email or web address] Date: 01/04/2021 Prepared by: Rosita Kea   Exercises Sidelying Thoracic Rotation with Open Book - 1-2 x daily - 2 sets - 10 reps - 1 breath hold Quadruped Thoracic Rotation Full Range with Hand on Neck - 1 x daily - 2  sets - 10 reps Low Trap Setting at [place] - 1 x daily - 2 sets - 10 reps Bridge - 1 x daily - 2 sets - 10 reps - 5 seconds hold Doorway Pec Stretch at 90 Degrees Abduction - 1 x daily - 3 reps - 30 hold    PT Education - 02/15/21 1439     Education Details form/technique with exercises.    Person(s) Educated Patient    Methods Explanation;Demonstration;Tactile cues;Verbal cues    Comprehension Verbalized understanding;Returned demonstration;Verbal cues required;Tactile cues required;Need further instruction              PT Short Term Goals - 01/19/21 1126       PT SHORT TERM GOAL #1   Title Be independent with initial home exercise program for self-management of symptoms.    Baseline initial HEP provided at IE (12/29/2020);  Time 2    Period Weeks    Status Achieved    Target Date 01/13/21               PT Long Term Goals - 12/30/20 1413       PT LONG TERM GOAL #1   Title Be independent with a long-term home exercise program for self-management of symptoms.    Baseline Initial HEP provided at IE (12/29/2020);    Time 12    Period Weeks    Status New   TARGET DATE FOR ALL LONG TERM GOALS: 03/23/2021     PT LONG TERM GOAL #2   Title Demonstrate improved FOTO to equal or greater than 75 by visit #10 to demonstrate improvement in overall condition and self-reported functional ability.    Baseline 63 (12/29/2020);    Time 12    Period Weeks    Status New      PT LONG TERM GOAL #3   Title Patient will improve lumbar extension to equal or greater than 50% to improve upright posture for improved standing tolerance for cooking.    Baseline 25% (12/29/2020);    Time 12    Period Weeks    Status New      PT LONG TERM GOAL #4   Title Reduce pain with functional activities to equal or less than 1/10 to allow patient to complete usual activities including ADLs, IADLs, cooking, and social engagement with less difficulty.    Baseline 10/10 with prolonged standing (12/29/2020);     Time 12    Period Weeks    Status New      PT LONG TERM GOAL #5   Title Complete community, work and/or recreational activities without limitation due to current condition.    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 Today he tolerated treatment with some difficulty due to quick fatigue with standing exercises and one instance of feeling a bit light headed. HR and SpO2 monitored and found to be [facility] but gave patient frequent seated breaks as needed. Continued working on improving spinal mobility and postural strength. Continues to stand and ambulate with stooped posture and returns to stooped position shortly after cuing for improvement. This negatively affects his standing posture and increases strain on lumbar and thoracic musculature likely contributing to his pain. Patient would benefit from continued management of limiting condition by skilled physical therapist to address remaining impairments and functional limitations to work towards stated goals and return to PLOF or maximal functional independence.    Personal Factors and Comorbidities Time since onset of injury/illness/exacerbation;Past/Current Experience;Comorbidity 3+    Comorbidities Relevant past medical history and comorbidities include asthma, Zenker's (hypopharyngeal) diverticulum, GERD, fatty liver, hypothyroidism, BPH, OA, hx adeomatous polyp of colon, obesity, inguinal hernia, hx of dysplastic nevus, colon surgery (7 inches removed of colon), cataract extraction.    Examination-Activity Limitations  Carry;Bend;Caring for Others;Lift;Stairs;Squat;Stand;Locomotion Level;Sit;Dressing;Transfers    Examination-Participation Restrictions Church;Community Activity;Meal Prep;Interpersonal Relationship;Yard Work    Stability/Clinical Decision Making Stable/Uncomplicated  Rehab Potential Good    PT Frequency 2x / week    PT Duration 12 weeks    PT Treatment/Interventions ADLs/Self Care Home Management;Aquatic Therapy;Cryotherapy;Moist Heat;Electrical Stimulation;Gait training;Stair training;Functional mobility training;Neuromuscular re-education;Therapeutic exercise;Therapeutic activities;Patient/family education;Manual techniques;Dry needling;Passive range of motion;Joint Manipulations;Spinal Manipulations    PT Next Visit Plan spinal mobility, postural and glute strengthening, functional strengthening, hip extension ROM    PT Home Exercise Plan Medbridge Access Code: [email or web address]    Consulted and Agree with Plan of Care Patient             Patient will benefit from skilled therapeutic intervention in order to improve the following deficits and impairments:  Improper body mechanics, Pain, Decreased mobility, Increased muscle spasms, Postural dysfunction, Decreased activity tolerance, Decreased endurance, Decreased range of motion, Decreased strength, Hypomobility, Impaired perceived functional ability, Difficulty walking  Visit Diagnosis: Chronic bilateral low back pain, unspecified whether sciatica present  Pain in thoracic spine  Abnormal posture     Problem List Patient Active Problem List   Diagnosis Date Noted   Zenker's (hypopharyngeal) diverticulum 03/22/2018   Gastroesophageal reflux disease 03/22/2018   Fatty liver 10/09/2017   BPH (benign prostatic hyperplasia) 11/21/2014   Hypothyroidism 11/21/2014   Obesity 11/21/2014   Asthma 11/21/2014   Inguinal hernia 11/21/2014   OA (osteoarthritis) 11/21/2014   H/O adenomatous polyp of colon 10/14/2014    Everlean Alstrom. Graylon Good, PT,  DPT 02/15/21, 3:30 PM   [facility] [address], [place], [address] Phone: [phone]   Fax:  [phone]  Name: DEMETRIOUS RAINFORD MRN: [id number] Date of Birth: Nov 01, 1950

## 2021-02-16 ENCOUNTER — Other Ambulatory Visit: Payer: Self-pay

## 2021-02-16 ENCOUNTER — Ambulatory Visit
Admission: RE | Admit: 2021-02-16 | Discharge: 2021-02-16 | Disposition: A | Discharge status: Home / Self Care | Payer: Medicare Other | Source: Ambulatory Visit | Attending: Family Medicine | Admitting: Family Medicine

## 2021-02-16 ENCOUNTER — Ambulatory Visit (INDEPENDENT_AMBULATORY_CARE_PROVIDER_SITE_OTHER): Payer: Medicare Other | Admitting: Family Medicine

## 2021-02-16 ENCOUNTER — Ambulatory Visit
Admission: RE | Admit: 2021-02-16 | Discharge: 2021-02-16 | Disposition: A | Discharge status: Home / Self Care | Payer: Medicare Other | Attending: Family Medicine | Admitting: Family Medicine

## 2021-02-16 ENCOUNTER — Encounter: Payer: Self-pay | Admitting: Family Medicine

## 2021-02-16 VITALS — BP 104/69 | HR 72 | Resp 16 | Ht 74.0 in | Wt 214.0 lb

## 2021-02-16 DIAGNOSIS — M791 Myalgia, unspecified site: Secondary | ICD-10-CM | POA: Diagnosis not present

## 2021-02-16 DIAGNOSIS — M25561 Pain in right knee: Secondary | ICD-10-CM

## 2021-02-16 DIAGNOSIS — T466X5A Adverse effect of antihyperlipidemic and antiarteriosclerotic drugs, initial encounter: Secondary | ICD-10-CM | POA: Diagnosis not present

## 2021-02-16 DIAGNOSIS — E785 Hyperlipidemia, unspecified: Secondary | ICD-10-CM

## 2021-02-16 MED ORDER — NAPROXEN 500 MG PO TABS: 500.0000 mg | ORAL_TABLET | Freq: Two times a day (BID) | ORAL | 1 refills | Status: DC | PRN

## 2021-02-16 NOTE — Progress Notes (Signed)
I,April Miller,acting as a scribe for Wilhemena Durie, MD.,have documented all relevant documentation on the behalf of Wilhemena Durie, MD,as directed by  Wilhemena Durie, MD while in the presence of Wilhemena Durie, MD.   Established patient visit   Patient: Daniel [name]   DOB: 1951/02/06   70 y.o. Male  MRN: [id number] Visit Date: 02/16/2021  Today's healthcare provider: Wilhemena Durie, MD   Chief Complaint  Patient presents with   Follow-up   Hyperlipidemia   Subjective    HPI  Patient comes in today for right knee pain.  Worse with weightbearing.  He has not had an evaluation of this. He also has history of myopathy with statins but is not on a statin at this time.  Muscles are fine. Lipid/Cholesterol, Follow-up  Last lipid panel Other pertinent labs  Lab Results  Component Value Date   CHOL 177 08/09/2020   HDL 54 08/09/2020   LDLCALC 100 (H) 08/09/2020   TRIG 131 08/09/2020   CHOLHDL 3.3 08/09/2020   Lab Results  Component Value Date   ALT 17 07/08/2020   AST 20 07/08/2020   PLT 230 05/27/2020   TSH 2.010 05/27/2020     He was last seen for this 6 months ago.  Management since that visit includes no medication changes.  He reports good compliance with treatment. He is not having side effects. none  Current diet: well balanced Current exercise: walking  The 10-year ASCVD risk score (Arnett DK, et al., 2019) is: 12.6%  --------------------------------------------------------------------------------------------------- Follow up for fatty liver disease  The patient was last seen for this 6 months ago. Changes made at last visit include no medication changes. Continue to monitor diet and increase exercise.  He reports good compliance with treatment. He feels that condition is Unchanged. He is not having side effects. none  -----------------------------------------------------------------------------------------  Patient has been  having right knee pain for over 1 week. Pain is worse when he is walking, climbing stairs, or sitting still for long periods of time. He has been wearing a knee brace.      Medications: Outpatient Medications Prior to Visit  Medication Sig   Famotidine (PEPCID PO) Take by mouth.   Ginkgo Biloba 40 MG TABS Take by mouth.   levothyroxine (SYNTHROID) 100 MCG tablet TAKE 1 TABLET BY MOUTH EVERY DAY BEFORE BREAKFAST   Multiple Vitamin (MULTIVITAMIN) capsule Take 1 capsule daily by mouth.   Omega-3 Fatty Acids (FISH OIL) 1200 MG CPDR Take by mouth.   pantoprazole (PROTONIX) 40 MG tablet TAKE 1 TABLET(40 MG) BY MOUTH TWICE DAILY   predniSONE (DELTASONE) 10 MG tablet Take 1 tablet (10 mg total) by mouth daily with breakfast.   [DISCONTINUED] ezetimibe (ZETIA) 10 MG tablet TAKE 1 TABLET(10 MG) BY MOUTH EVERY MORNING (Patient not taking: No sig reported)   No facility-administered medications prior to visit.    Review of Systems  Constitutional:  Negative for activity change and fatigue.  Respiratory:  Negative for cough and shortness of breath.   Cardiovascular:  Negative for chest pain, palpitations and leg swelling.  Musculoskeletal:  Negative for arthralgias and myalgias.  Neurological:  Negative for dizziness, light-headedness and headaches.  Psychiatric/Behavioral:  Negative for self-injury, sleep disturbance and suicidal ideas. The patient is not nervous/anxious.        Objective    BP 104/69 (BP Location: Left Arm, Patient Position: Sitting, Cuff Size: Large)   Pulse 72   Resp 16   Ht  6\' 2"  (1.88 m)   Wt 214 lb (97.1 kg)   SpO2 95%   BMI 27.48 kg/m     Physical Exam Vitals reviewed.  Constitutional:      General: He is not in acute distress.    Appearance: He is well-developed.  HENT:     Head: Normocephalic and atraumatic.     Right Ear: Hearing normal.     Left Ear: Hearing normal.     Nose: Nose normal.  Eyes:     General: Lids are normal. No scleral icterus.        Right eye: No discharge.        Left eye: No discharge.     Conjunctiva/sclera: Conjunctivae normal.  Cardiovascular:     Rate and Rhythm: Normal rate and regular rhythm.     Heart sounds: Normal heart sounds.  Pulmonary:     Effort: Pulmonary effort is normal. No respiratory distress.  Musculoskeletal:     Comments: Effusion of the right knee.  Possible mild joint line tenderness.  Stable exam.  Skin:    Findings: No lesion or rash.  Neurological:     General: No focal deficit present.     Mental Status: He is alert and oriented to person, place, and time.  Psychiatric:        Mood and Affect: Mood normal.        Speech: Speech normal.        Behavior: Behavior normal.        Thought Content: Thought content normal.        Judgment: Judgment normal.      No results found for any visits on 02/16/21.  Assessment & Plan     1. Hyperlipidemia, unspecified hyperlipidemia type Patient intolerant of statins according to his history with myopathy/myalgia - Comprehensive Metabolic Panel (CMET) - Lipid panel  2. Myalgia due to HMG CoA reductase inhibitor  - Comprehensive Metabolic Panel (CMET) - Lipid panel  3. Acute pain of right knee Treat with naproxen and x-ray knee ankle and refer to orthopedics.  At his age more likely arthritic than any other acute injury which he denies. - AMB referral to orthopedics - naproxen (NAPROSYN) 500 MG tablet; Take 1 tablet (500 mg total) by mouth 2 (two) times daily as needed.  Dispense: 60 tablet; Refill: 1 - DG Knee Complete 4 Views Right   No follow-ups on file.      I, Wilhemena Durie, MD, have reviewed all documentation for this visit. The documentation on 02/19/21 for the exam, diagnosis, procedures, and orders are all accurate and complete.    Niyonna Betsill Cranford Mon, MD  [facility] [phone] (phone) [phone] (fax)  [place]

## 2021-02-16 NOTE — Patient Instructions (Signed)
WEAR KNEE SLEEVE.

## 2021-02-17 ENCOUNTER — Ambulatory Visit: Payer: Medicare Other | Admitting: Physical Therapy

## 2021-02-17 ENCOUNTER — Encounter: Payer: Self-pay | Admitting: Physical Therapy

## 2021-02-17 DIAGNOSIS — R293 Abnormal posture: Secondary | ICD-10-CM | POA: Diagnosis not present

## 2021-02-17 DIAGNOSIS — G8929 Other chronic pain: Secondary | ICD-10-CM

## 2021-02-17 DIAGNOSIS — M546 Pain in thoracic spine: Secondary | ICD-10-CM

## 2021-02-17 DIAGNOSIS — M545 Low back pain, unspecified: Secondary | ICD-10-CM | POA: Diagnosis not present

## 2021-02-17 LAB — COMPREHENSIVE METABOLIC PANEL
ALT: 15 IU/L (ref 0–44)
AST: 15 IU/L (ref 0–40)
Albumin/Globulin Ratio: 2.1 (ref 1.2–2.2)
Albumin: 4.1 g/dL (ref 3.7–4.7)
Alkaline Phosphatase: 67 IU/L (ref 44–121)
BUN/Creatinine Ratio: 20 (ref 10–24)
BUN: 17 mg/dL (ref 8–27)
Bilirubin Total: 0.6 mg/dL (ref 0.0–1.2)
CO2: 21 mmol/L (ref 20–29)
Calcium: 8.8 mg/dL (ref 8.6–10.2)
Chloride: 103 mmol/L (ref 96–106)
Creatinine, Ser: 0.85 mg/dL (ref 0.76–1.27)
Globulin, Total: 2 g/dL (ref 1.5–4.5)
Glucose: 91 mg/dL (ref 65–99)
Potassium: 3.6 mmol/L (ref 3.5–5.2)
Sodium: 141 mmol/L (ref 134–144)
Total Protein: 6.1 g/dL (ref 6.0–8.5)
eGFR: 93 mL/min/{1.73_m2} (ref >59)

## 2021-02-17 LAB — LIPID PANEL
Chol/HDL Ratio: 4 ratio (ref 0.0–5.0)
Cholesterol, Total: 194 mg/dL (ref 100–199)
HDL: 48 mg/dL (ref >39)
LDL Chol Calc (NIH): 104 mg/dL — ABNORMAL HIGH (ref 0–99)
Triglycerides: 248 mg/dL — ABNORMAL HIGH (ref 0–149)
VLDL Cholesterol Cal: 42 mg/dL — ABNORMAL HIGH (ref 5–40)

## 2021-02-17 NOTE — Therapy (Signed)
[facility] [address] [place], [address] Phone: [phone]   Fax:  [phone]  Physical Therapy Treatment  Patient Details  Name: SAIFULLAH JOLLEY MRN: [id number] Date of Birth: [birth year] Referring Provider (PT): Jerrol Banana., MD   Encounter Date: 02/17/2021   PT End of Session - 02/17/21 1519     Visit Number 9    Number of Visits 24    Date for PT Re-Evaluation 03/23/21    Authorization Type [employer] reporting period from 12/29/2020    Progress Note Due on Visit 10    PT Start Time 1430    PT Stop Time 1515    PT Time Calculation (min) 45 min    Activity Tolerance Patient tolerated treatment well    Behavior During Therapy [facility] for tasks assessed/performed             Past Medical History:  Diagnosis Date   Actinic keratosis    Asthma    Eczema    Hx of dysplastic nevus 06/06/2006   Mid lower back, slight atypia   Hypothyroidism    Seasonal allergies    Thyroid disease     Past Surgical History:  Procedure Laterality Date   CATARACT EXTRACTION     COLON SURGERY     COLONOSCOPY WITH PROPOFOL N/A 11/18/2014   Procedure: COLONOSCOPY WITH PROPOFOL;  Surgeon: [name], MD;  Location: [facility];  Service: Endoscopy;  Laterality: N/A;   EYE SURGERY     KNEE SURGERY     NASAL SEPTUM SURGERY      There were no vitals filed for this visit.   Subjective Assessment - 02/17/21 1518     Subjective Patient is doing well. He had a doctor appointment yesterday to discuss his R knee pain. Per patient report, his physician ordered a R knee x-ray, prescribed naproxen, and recommended use of R knee sleeve to manage knee symptoms. He states that back does not hurt, currently but he feels as if he is constantly leaning forward while standing. Patient R knee pain is 1/10 currently. Patient reports being more tired since having covid, but believes that has improved slightly since  recovering.    Pertinent History Patient is a 70 y.o. male who presents to outpatient physical therapy with a referral for medical diagnosis acute bilateral low back pain without sciatica. This patient's chief complaints consist of low back pain with radiation into R > L posterior glute and up to mid thoracic spine, leading to the following functional deficits: cooking for church, prolonged sitting, prolonged standing, yard work, Architectural technologist, sit <> stand, stairs, bending, lifting, staining deck, shopping, walking, social participation.    Relevant past medical history and comorbidities include asthma, Zenker's (hypopharyngeal) diverticulum, GERD, fatty liver, hypothyroidism, BPH, OA, hx adeomatous polyp of colon, obesity, inguinal hernia, hx of dysplastic nevus, colon surgery (7 inches removed of colon), cataract extraction.  Patient denies hx of cancer, stroke, seizures, lung problem, major cardiac events, diabetes, unexplained weight loss, changes in bowel or bladder problems, new onset stumbling or dropping things, spinal surgery.    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   How long can you stand comfortably? 1-1.5 hours    Diagnostic tests Lumbar radiograph report 11/30/2020: "IMPRESSION:  1. Moderate multilevel lumbar spondylosis, progressed from  prior.  2. Moderate-large volume of stool throughout the visualized colon."    Patient Stated Goals "to be able to stay on my feet as long as I need to without my back killing me"    Currently in Pain? Yes    Pain Score 1     Pain Location Back    Pain Type Chronic pain    Pain Onset More than a month ago             TREATMENT:    Manual therapy: to reduce pain and tissue tension, improve range of motion,  neuromodulation, in order to promote improved ability to complete functional activities. PRONE - CPA grade III-IV with and without KE wedge from T3-L5, 1x10-20 reps/segment for improve joint motion and joint nutrition improved ranges of mobility.    Therapeutic exercise: to centralize symptoms and improve ROM, strength, muscular endurance, and activity tolerance required for successful completion of functional activities.    - Side lying thoracic open books: 2x20/side  - Prone press ups: 2x15 reps, with 3-5 sec holds  - Standing wall angels: 3x15, limited shoulder AROM slightly > 90 deg - standing Y lift offs facing the wall, 3x20 (Pt needed sitting break after y's).  - standing doorway pec stretch, 4x30 seconds (Pt needed sitting break after standing pec stretch).  - seated wide rows facing chest press machine, 1x10 with 20# cable. 3x10 with 25lb cable.  - Lat pull down to chest with cable, 3x15, 45 lbs cable. VC's for posture.  - standing B shoulder extension with GTB anchored overhead, 3x15. Tactile cues for improved posture and form.    Pt required multimodal cuing for proper technique and to facilitate improved neuromuscular control, strength, range of motion, and functional ability resulting in improved performance and form.     HOME EXERCISE PROGRAM Access Code: [email or web address] URL: [email or web address] Date: 01/04/2021 Prepared by: Rosita Kea   Exercises Sidelying Thoracic Rotation with Open Book - 1-2 x daily - 2 sets - 10 reps - 1 breath hold Quadruped Thoracic Rotation Full Range with Hand on Neck - 1 x daily - 2 sets - 10 reps Low Trap Setting at [place] - 1 x daily - 2 sets - 10 reps Bridge - 1 x daily - 2 sets - 10 reps - 5 seconds hold Doorway Pec Stretch at 90 Degrees Abduction - 1 x daily - 3 reps - 30 hold        PT Education - 02/17/21 1519     Education Details form/technique with exercises.    Person(s) Educated Patient    Methods  Explanation;Demonstration;Tactile cues;Verbal cues    Comprehension Verbalized understanding;Returned demonstration;Tactile cues required;Verbal cues required              PT Short Term Goals - 01/19/21 1126       PT SHORT TERM GOAL #1   Title Be independent with initial home exercise program for self-management of symptoms.    Baseline initial HEP provided at IE (12/29/2020);    Time 2    Period Weeks    Status Achieved    Target Date 01/13/21               PT Long Term Goals - 12/30/20 1413       PT LONG TERM GOAL #1   Title Be independent with a long-term home exercise program for self-management of symptoms.    Baseline Initial HEP provided at IE (12/29/2020);    Time 12  Period Weeks    Status New   TARGET DATE FOR ALL LONG TERM GOALS: 03/23/2021     PT LONG TERM GOAL #2   Title Demonstrate improved FOTO to equal or greater than 75 by visit #10 to demonstrate improvement in overall condition and self-reported functional ability.    Baseline 63 (12/29/2020);    Time 12    Period Weeks    Status New      PT LONG TERM GOAL #3   Title Patient will improve lumbar extension to equal or greater than 50% to improve upright posture for improved standing tolerance for cooking.    Baseline 25% (12/29/2020);    Time 12    Period Weeks    Status New      PT LONG TERM GOAL #4   Title Reduce pain with functional activities to equal or less than 1/10 to allow patient to complete usual activities including ADLs, IADLs, cooking, and social engagement with less difficulty.    Baseline 10/10 with prolonged standing (12/29/2020);    Time 12    Period Weeks    Status New      PT LONG TERM GOAL #5   Title Complete community, work and/or recreational activities without limitation due to current condition.    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 Patient responded well to progression of seated wide rows. However, patient continues to demonstrate poor stooped over posture and requires constant cuing to maintain appropriate posture and perform postural strengthening exercises correctly. Patient will continue to benefit from skilled physical therapy intervention to address impairments and maximize functional ability.    Personal Factors and Comorbidities Time since onset of injury/illness/exacerbation;Past/Current Experience;Comorbidity 3+    Comorbidities Relevant past medical history and comorbidities include asthma, Zenker's (hypopharyngeal) diverticulum, GERD, fatty liver, hypothyroidism, BPH, OA, hx adeomatous polyp of colon, obesity, inguinal hernia, hx of dysplastic nevus, colon surgery (7 inches removed of colon), cataract extraction.    Examination-Activity Limitations Carry;Bend;Caring for Others;Lift;Stairs;Squat;Stand;Locomotion Level;Sit;Dressing;Transfers    Examination-Participation Restrictions Church;Community Activity;Meal Prep;Interpersonal Relationship;Yard Work    Stability/Clinical Decision Making Stable/Uncomplicated    Rehab Potential Good    PT Frequency 2x / week    PT Duration 12 weeks    PT Treatment/Interventions ADLs/Self Care Home Management;Aquatic Therapy;Cryotherapy;Moist Heat;Electrical Stimulation;Gait training;Stair training;Functional mobility training;Neuromuscular re-education;Therapeutic exercise;Therapeutic activities;Patient/family education;Manual techniques;Dry needling;Passive range of motion;Joint Manipulations;Spinal Manipulations    PT Next Visit Plan spinal mobility, postural and glute  strengthening, functional strengthening, hip extension ROM    PT Home Exercise Plan Medbridge Access Code: [email or web address]    Consulted and Agree with Plan of Care Patient             Patient will benefit from skilled therapeutic intervention in order to improve the following deficits and impairments:  Improper body mechanics, Pain, Decreased mobility, Increased muscle spasms, Postural dysfunction, Decreased activity tolerance, Decreased endurance, Decreased range of motion, Decreased strength, Hypomobility, Impaired perceived functional ability, Difficulty walking  Visit Diagnosis: Chronic bilateral low back pain, unspecified whether  sciatica present  Pain in thoracic spine  Abnormal posture     Problem List Patient Active Problem List   Diagnosis Date Noted   Zenker's (hypopharyngeal) diverticulum 03/22/2018   Gastroesophageal reflux disease 03/22/2018   Fatty liver 10/09/2017   BPH (benign prostatic hyperplasia) 11/21/2014   Hypothyroidism 11/21/2014   Obesity 11/21/2014   Asthma 11/21/2014   Inguinal hernia 11/21/2014   OA (osteoarthritis) 11/21/2014   H/O adenomatous polyp of colon 10/14/2014    Sherryll Burger, SPT  Student physical therapist under direct supervision of licensed physical therapists during the entirety of the session.   Everlean Alstrom. Graylon Good, PT, DPT 02/17/21, 4:11 PM   [facility] [address] [place], [address] Phone: [phone]   Fax:  [phone]  Name: DREYDON CARDENAS MRN: [id number] Date of Birth: [birth year]

## 2021-02-22 ENCOUNTER — Encounter: Payer: Self-pay | Admitting: Physical Therapy

## 2021-02-22 ENCOUNTER — Ambulatory Visit: Payer: Medicare Other | Admitting: Physical Therapy

## 2021-02-22 DIAGNOSIS — G8929 Other chronic pain: Secondary | ICD-10-CM | POA: Diagnosis not present

## 2021-02-22 DIAGNOSIS — M546 Pain in thoracic spine: Secondary | ICD-10-CM

## 2021-02-22 DIAGNOSIS — R293 Abnormal posture: Secondary | ICD-10-CM | POA: Diagnosis not present

## 2021-02-22 DIAGNOSIS — M545 Low back pain, unspecified: Secondary | ICD-10-CM | POA: Diagnosis not present

## 2021-02-22 NOTE — Therapy (Addendum)
[facility] [address] [place], [address] Phone: [phone]   Fax:  [phone]  Physical Therapy Treatment / Progress Note Dates of Reporting from 12/29/2020 - 12/22/2020  Patient Details  Name: Daniel [name] MRN: [id number] Date of Birth: 06-19-1950 Referring Provider (PT): Jerrol Banana., MD   Encounter Date: 02/22/2021   PT End of Session - 02/22/21 1538     Visit Number 10    Number of Visits 24    Date for PT Re-Evaluation 03/23/21    Authorization Type [employer] reporting period from 12/29/2020    Progress Note Due on Visit 10    PT Start Time 1435    PT Stop Time 1515    PT Time Calculation (min) 40 min    Activity Tolerance Patient tolerated treatment well    Behavior During Therapy [facility] for tasks assessed/performed             Past Medical History:  Diagnosis Date   Actinic keratosis    Asthma    Eczema    Hx of dysplastic nevus 06/06/2006   Mid lower back, slight atypia   Hypothyroidism    Seasonal allergies    Thyroid disease     Past Surgical History:  Procedure Laterality Date   CATARACT EXTRACTION     COLON SURGERY     COLONOSCOPY WITH PROPOFOL N/A 11/18/2014   Procedure: COLONOSCOPY WITH PROPOFOL;  Surgeon: [name], MD;  Location: [facility];  Service: Endoscopy;  Laterality: N/A;   EYE SURGERY     KNEE SURGERY     NASAL SEPTUM SURGERY      There were no vitals filed for this visit.   Subjective Assessment - 02/22/21 1437     Subjective Patient reports he feels like PT is helping but he overdid it on Sunday by continuing to push himself through the pain and stiffness he had while helping someone shop and at church. His back has been giving him trouble the last two days. It is frustrating to him because he has had back problems before and they haven't lasted this long. Mostly muscle relaxants and pain killers helped him get over it in 10-12 days. R knee is  doing better. Patient reports no pain in his back currently. The problem is his back gets stiff and then starts to hurt when he is up too much. States he was on predgnisone last week on in [place]. It varies how much he can be up before it hurts, ranging from 2-4 hours. Pain starts in low back and spreads to scapluale. He feels like PT has helped him because he can go 2-3 hours without his pain bothering him. HEP is going well and he gets relief when he does it at home. Currently attempting to perform 2 times per day. Reports he feels stooped  like he has to bend over when walking.    Pertinent History Patient is a 70 y.o. male who presents to outpatient physical therapy with a referral for medical diagnosis acute bilateral low back pain without sciatica. This patient's chief complaints consist of low back pain with radiation into R > L posterior glute and up to mid thoracic spine, leading to the following functional deficits: cooking for church, prolonged sitting, prolonged standing, yard work, Architectural technologist, sit <> stand, stairs, bending, lifting, staining deck, shopping, walking, social participation.    Relevant past medical history and comorbidities include asthma, Zenker's (  hypopharyngeal) diverticulum, GERD, fatty liver, hypothyroidism, BPH, OA, hx adeomatous polyp of colon, obesity, inguinal hernia, hx of dysplastic nevus, colon surgery (7 inches removed of colon), cataract extraction.  Patient denies hx of cancer, stroke, seizures, lung problem, major cardiac events, diabetes, unexplained weight loss, changes in bowel or bladder problems, new onset stumbling or dropping things, spinal surgery.    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   How long can you stand  comfortably? 1-1.5 hours    Diagnostic tests Lumbar radiograph report 11/30/2020: "IMPRESSION:  1. Moderate multilevel lumbar spondylosis, progressed from prior.  2. Moderate-large volume of stool throughout the visualized colon."    Patient Stated Goals "to be able to stay on my feet as long as I need to without my back killing me"    Currently in Pain? No/denies    Pain Onset More than a month ago    Effect of Pain on Daily Activities cooking for church, prolonged sitting (1.5 hours typing for meetings), prolonged standing, yard work, gas operated Market researcher, sit <> stand, bending, lifting, staining deck, walking at times.            OBJECTIVE SELF-REPORTED FUNCTION FOTO score: 56/100 (lumbar spine questionnaire)  SPINE MOTION Lumbar AROM *Indicates pain Flexion: = fingers to distal shin (normal range for him, end range pain at low back). Extension: = 25% min pain across low back, worse  Rotation: R = WFL, L = WFL Side Flexion: R = 25% R sided pain, L = 25% slight R sided discomfort.     TREATMENT:    Manual therapy: to reduce pain and tissue tension, improve range of motion, neuromodulation, in order to promote improved ability to complete functional activities. PRONE - CPA grade III-IV with and without KE wedge from T3-L5, 1x~20 reps/segment for improve joint motion and joint nutrition improved ranges of mobility.    Therapeutic exercise: to centralize symptoms and improve ROM, strength, muscular endurance, and activity tolerance required for successful completion of functional activities.  - measurements to assess progress (see above)  - Prone press ups: 2x10 reps - wall sags, 2x10 - standing lumbar extension with table behind, 2x10 - bird dog, 3x10 each side plus time for practice of components. Improved form with cuing for extended LE but would benefit from further instruction at future sessions.  - Education on HEP including handout    Pt required multimodal cuing for  proper technique and to facilitate improved neuromuscular control, strength, range of motion, and functional ability resulting in improved performance and form.     HOME EXERCISE PROGRAM Access Code: [email or web address] URL: [email or web address] Date: 01/04/2021 Prepared by: Rosita Kea   Exercises Sidelying Thoracic Rotation with Open Book - 1-2 x daily - 2 sets - 10 reps - 1 breath hold Quadruped Thoracic Rotation Full Range with Hand on Neck - 1 x daily - 2 sets - 10 reps Low Trap Setting at [place] - 1 x daily - 2 sets - 10 reps Bridge - 1 x daily - 2 sets - 10 reps - 5 seconds hold Doorway Pec Stretch at 90 Degrees Abduction - 1 x daily - 3 reps - 30 hold    PT Education - 02/22/21 1538     Education Details form/technique with exercises. progress, POC, HEP    Person(s) Educated Patient    Methods Explanation;Demonstration;Tactile cues;Handout;Verbal cues  Comprehension Verbalized understanding;Returned demonstration;Verbal cues required;Tactile cues required;Need further instruction              PT Short Term Goals - 01/19/21 1126       PT SHORT TERM GOAL #1   Title Be independent with initial home exercise program for self-management of symptoms.    Baseline initial HEP provided at IE (12/29/2020);    Time 2    Period Weeks    Status Achieved    Target Date 01/13/21               PT Long Term Goals - 02/22/21 1443       PT LONG TERM GOAL #1   Title Be independent with a long-term home exercise program for self-management of symptoms.    Baseline Initial HEP provided at IE (12/29/2020); participating in appropriate HEP (02/22/2021);    Time 12    Period Weeks    Status Partially Met   TARGET DATE FOR ALL LONG TERM GOALS: 03/23/2021     PT LONG TERM GOAL #2   Title Demonstrate improved FOTO to equal or greater than 75 by visit #10 to demonstrate improvement in overall condition and self-reported functional ability.    Baseline 63 (12/29/2020); 56  (02/22/2021);    Time 12    Period Weeks    Status On-going      PT LONG TERM GOAL #3   Title Patient will improve lumbar extension to equal or greater than 50% to improve upright posture for improved standing tolerance for cooking.    Baseline 25% (12/29/2020); 25% less pain (02/22/2021);    Time 12    Period Weeks    Status On-going      PT LONG TERM GOAL #4   Title Reduce pain with functional activities to equal or less than 1/10 to allow patient to complete usual activities including ADLs, IADLs, cooking, and social engagement with less difficulty.    Baseline 10/10 with prolonged standing (12/29/2020); 8/10 (02/22/2021);    Time 12    Period Weeks    Status Partially Met      PT LONG TERM GOAL #5   Title Complete community, work and/or recreational activities without limitation due to current condition.    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, stairs, bending, lifting, staining deck, shopping, walking, social participation (12/29/2020); stairs improved, can stand a bit longer and walking is better (02/22/2021);    Time 12    Period Weeks    Status Partially Met                   Plan - 02/22/21 1546     Clinical Impression Statement Patient has attended 10 physical therapy sessions this episode of care and has made mild progress towards goals. Patient reports he feels he can stand and walk longer and no longer has difficulty with stairs compared when he started PT. He demonstrates improved comfort with lumbar range of motion but continues to be severely limited in lumbar extension. He reports relief of symptoms with HEP but does not have strong carry over into prolonged standing activities and continues to demonstrate stooped posture. His plan of care was interrupted by COVID19 infection that has increased his fatigability, likely negatively affecting  his progress, and has caused him to request increased rest breaks for the last 3 visit. Plan to continue working towards patients goals  by focusing remaining impairments and functional limitations. Patient also demonstrates significantly limited hip extension ROM that transfers abnormal load from lower extremities to low back. Patient presents with significant pain, ROM, posture, joint stiffness, muscle tension, muscle performance (strength/power/endurance) and activity tolerance impairments that are limiting ability to complete usual activities such as cooking for church, prolonged sitting, prolonged standing, yard work, Architectural technologist, sit <> stand, bending, lifting, staining deck, shopping, walking, social participation without difficulty. Patient will benefit from skilled physical therapy intervention to address current body structure impairments and activity limitations to improve function and work towards goals set in current POC in order to return to prior level of function or maximal functional improvement    Personal Factors and Comorbidities Time since onset of injury/illness/exacerbation;Past/Current Experience;Comorbidity 3+    Comorbidities Relevant past medical history and comorbidities include asthma, Zenker's (hypopharyngeal) diverticulum, GERD, fatty liver, hypothyroidism, BPH, OA, hx adeomatous polyp of colon, obesity, inguinal hernia, hx of dysplastic nevus, colon surgery (7 inches removed of colon), cataract extraction.    Examination-Activity Limitations Carry;Bend;Caring for Others;Lift;Stairs;Squat;Stand;Locomotion Level;Sit;Dressing;Transfers    Examination-Participation Restrictions Church;Community Activity;Meal Prep;Interpersonal Relationship;Yard Work    Stability/Clinical Decision Making Stable/Uncomplicated    Rehab Potential Good    PT Frequency 2x / week    PT Duration 12 weeks    PT Treatment/Interventions ADLs/Self Care Home Management;Aquatic  Therapy;Cryotherapy;Moist Heat;Electrical Stimulation;Gait training;Stair training;Functional mobility training;Neuromuscular re-education;Therapeutic exercise;Therapeutic activities;Patient/family education;Manual techniques;Dry needling;Passive range of motion;Joint Manipulations;Spinal Manipulations    PT Next Visit Plan spinal mobility, postural and glute strengthening, functional strengthening, hip extension ROM    PT Home Exercise Plan Medbridge Access Code: [email or web address]    Consulted and Agree with Plan of Care Patient             Patient will benefit from skilled therapeutic intervention in order to improve the following deficits and impairments:  Improper body mechanics, Pain, Decreased mobility, Increased muscle spasms, Postural dysfunction, Decreased activity tolerance, Decreased endurance, Decreased range of motion, Decreased strength, Hypomobility, Impaired perceived functional ability, Difficulty walking  Visit Diagnosis: Chronic bilateral low back pain, unspecified whether sciatica present  Pain in thoracic spine  Abnormal posture     Problem List Patient Active Problem List   Diagnosis Date Noted   Zenker's (hypopharyngeal) diverticulum 03/22/2018   Gastroesophageal reflux disease 03/22/2018   Fatty liver 10/09/2017   BPH (benign prostatic hyperplasia) 11/21/2014   Hypothyroidism 11/21/2014   Obesity 11/21/2014   Asthma 11/21/2014   Inguinal hernia 11/21/2014   OA (osteoarthritis) 11/21/2014   H/O adenomatous polyp of colon 10/14/2014   Everlean Alstrom. Graylon Good, PT, DPT 02/22/21, 3:47 PM   Addendum to correct error of missing FOTO score Everlean Alstrom. Graylon Good, PT, DPT 02/24/21, 2:55 PM   [facility] [address] [place], [address] Phone: [phone]   Fax:  [phone]  Name: Daniel [name] MRN: [id number] Date of Birth: [birth year]

## 2021-02-24 ENCOUNTER — Ambulatory Visit: Payer: Medicare Other | Admitting: Physical Therapy

## 2021-02-24 ENCOUNTER — Encounter: Payer: Self-pay | Admitting: Physical Therapy

## 2021-02-24 DIAGNOSIS — M546 Pain in thoracic spine: Secondary | ICD-10-CM | POA: Diagnosis not present

## 2021-02-24 DIAGNOSIS — R293 Abnormal posture: Secondary | ICD-10-CM

## 2021-02-24 DIAGNOSIS — G8929 Other chronic pain: Secondary | ICD-10-CM

## 2021-02-24 DIAGNOSIS — M545 Low back pain, unspecified: Secondary | ICD-10-CM | POA: Diagnosis not present

## 2021-02-24 NOTE — Therapy (Addendum)
[facility] [address] [place], [address] Phone: [phone]   Fax:  [phone]  Physical Therapy Treatment  Patient Details  Name: Daniel [name] MRN: [id number] Date of Birth: 1951/01/03 Referring Provider (PT): Jerrol Banana., MD   Encounter Date: 02/24/2021   PT End of Session - 02/24/21 1440     Visit Number 11    Number of Visits 24    Date for PT Re-Evaluation 03/23/21    Authorization Type [employer] reporting period from 02/22/2021    Progress Note Due on Visit 20    PT Start Time 1435    PT Stop Time 1507    PT Time Calculation (min) 32 min    Activity Tolerance Patient tolerated treatment well    Behavior During Therapy [facility] for tasks assessed/performed             Past Medical History:  Diagnosis Date   Actinic keratosis    Asthma    Eczema    Hx of dysplastic nevus 06/06/2006   Mid lower back, slight atypia   Hypothyroidism    Seasonal allergies    Thyroid disease     Past Surgical History:  Procedure Laterality Date   CATARACT EXTRACTION     COLON SURGERY     COLONOSCOPY WITH PROPOFOL N/A 11/18/2014   Procedure: COLONOSCOPY WITH PROPOFOL;  Surgeon: [name], MD;  Location: [facility];  Service: Endoscopy;  Laterality: N/A;   EYE SURGERY     KNEE SURGERY     NASAL SEPTUM SURGERY      There were no vitals filed for this visit.   Subjective Assessment - 02/24/21 1439     Subjective Patient reports no pain in his back or R knee upon arrival and states he felt good yesterday as well. He states he needs to leave at 3:10pm today for a meeting. States HEP is going well.    Pertinent History Patient is a 70 y.o. male who presents to outpatient physical therapy with a referral for medical diagnosis acute bilateral low back pain without sciatica. 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    Relevant past medical history and comorbidities include asthma, Zenker's (hypopharyngeal) diverticulum, GERD, fatty liver, hypothyroidism, BPH, OA, hx adeomatous polyp of colon, obesity, inguinal hernia, hx of dysplastic nevus, colon surgery (7 inches removed of colon), cataract extraction.  Patient denies hx of cancer, stroke, seizures, lung problem, major cardiac events, diabetes, unexplained weight loss, changes in bowel or bladder problems, new onset stumbling or dropping things, spinal surgery.    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   How long can you stand comfortably? 1-1.5 hours    Diagnostic tests Lumbar radiograph report 11/30/2020: "IMPRESSION:  1. Moderate multilevel lumbar spondylosis, progressed from prior.  2. Moderate-large volume of stool throughout the visualized colon."    Patient Stated Goals "to be able to stay on my feet as long as I need to without my back killing me"    Currently in Pain? No/denies  Pain Onset More than a month ago               TREATMENT:    Manual therapy: to reduce pain and tissue tension, improve range of motion, neuromodulation, in order to promote improved ability to complete functional activities. PRONE - CPA grade III-IV with and without KE wedge from T3-L5, 1x~20  -30 reps/segment for improve joint motion and joint nutrition improved ranges of mobility.    Therapeutic exercise: to centralize symptoms and improve ROM, strength, muscular endurance, and activity tolerance required  for successful completion of functional activities.  - hooklying bridge with hip abduction against GTB around distal thighs, 2x20 (cued for 5 seconds, but pt holds for 1 so did more reps).  - Prone press ups: 2x10 reps - wall sags, 2x10 - standing lumbar extension with table behind, 2x10 - bird dog, 3x10 each side plus time for practice of components. Improved form with cuing for extended LE but would benefit from further instruction at future sessions.  - face pulls with 10# cable, 3x10 - seated lat pull, 35# cable, 3x10    Pt required multimodal cuing for proper technique and to facilitate improved neuromuscular control, strength, range of motion, and functional ability resulting in improved performance and form.     HOME EXERCISE PROGRAM Access Code: [email or web address] URL: [email or web address] Date: 01/04/2021 Prepared by: Rosita Kea   Exercises Sidelying Thoracic Rotation with Open Book - 1-2 x daily - 2 sets - 10 reps - 1 breath hold Quadruped Thoracic Rotation Full Range with Hand on Neck - 1 x daily - 2 sets - 10 reps Low Trap Setting at [place] - 1 x daily - 2 sets - 10 reps Bridge - 1 x daily - 2 sets - 10 reps - 5 seconds hold Doorway Pec Stretch at 90 Degrees Abduction - 1 x daily - 3 reps - 30 hold     PT Education - 02/24/21 1440     Education Details form/technique with exercises.    Person(s) Educated Patient    Methods Explanation;Demonstration;Tactile cues;Verbal cues    Comprehension Verbalized understanding;Returned demonstration;Verbal cues required;Tactile cues required;Need further instruction              PT Short Term Goals - 01/19/21 1126       PT SHORT TERM GOAL #1   Title Be independent with initial home exercise program for self-management of symptoms.    Baseline initial HEP provided at IE (12/29/2020);    Time 2    Period Weeks    Status Achieved    Target Date 01/13/21               PT Long Term Goals - 02/22/21 1443        PT LONG TERM GOAL #1   Title Be independent with a long-term home exercise program for self-management of symptoms.    Baseline Initial HEP provided at IE (12/29/2020); participating in appropriate HEP (02/22/2021);    Time 12    Period Weeks    Status Partially Met   TARGET DATE FOR ALL LONG TERM GOALS: 03/23/2021     PT LONG TERM GOAL #2   Title Demonstrate improved FOTO to equal or greater than 75 by visit #10 to demonstrate improvement in overall condition and self-reported functional ability.    Baseline 63 (12/29/2020); 56 (02/22/2021);    Time 12    Period Weeks    Status On-going      PT LONG TERM GOAL #3  Title Patient will improve lumbar extension to equal or greater than 50% to improve upright posture for improved standing tolerance for cooking.    Baseline 25% (12/29/2020); 25% less pain (02/22/2021);    Time 12    Period Weeks    Status On-going      PT LONG TERM GOAL #4   Title Reduce pain with functional activities to equal or less than 1/10 to allow patient to complete usual activities including ADLs, IADLs, cooking, and social engagement with less difficulty.    Baseline 10/10 with prolonged standing (12/29/2020); 8/10 (02/22/2021);    Time 12    Period Weeks    Status Partially Met      PT LONG TERM GOAL #5   Title Complete community, work and/or recreational activities without limitation due to current condition.    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 Continued to focus on improved  extensor strength/endurance and spine mobility. Patient continues to demonstrate difficulty with stooped posture and reproduction of low back symptoms with CPA to the upper lumbar spine. Patient demo improved technique for bird dog exercise but continues to lack full LE extension. May benefit from hip flexion stretch next session. Patient would benefit from continued management of limiting condition by skilled physical therapist to address remaining impairments and functional limitations to work towards stated goals and return to PLOF or maximal functional independence.    Personal Factors and Comorbidities Time since onset of injury/illness/exacerbation;Past/Current Experience;Comorbidity 3+    Comorbidities Relevant past medical history and comorbidities include asthma, Zenker's (hypopharyngeal) diverticulum, GERD, fatty liver, hypothyroidism, BPH, OA, hx adeomatous polyp of colon, obesity, inguinal hernia, hx of dysplastic nevus, colon surgery (7 inches removed of colon), cataract extraction.    Examination-Activity Limitations Carry;Bend;Caring for Others;Lift;Stairs;Squat;Stand;Locomotion Level;Sit;Dressing;Transfers    Examination-Participation Restrictions Church;Community Activity;Meal Prep;Interpersonal Relationship;Yard Work    Stability/Clinical Decision Making Stable/Uncomplicated    Rehab Potential Good    PT Frequency 2x / week    PT Duration 12 weeks    PT Treatment/Interventions ADLs/Self Care Home Management;Aquatic Therapy;Cryotherapy;Moist Heat;Electrical Stimulation;Gait training;Stair training;Functional mobility training;Neuromuscular re-education;Therapeutic exercise;Therapeutic activities;Patient/family education;Manual techniques;Dry needling;Passive range of motion;Joint Manipulations;Spinal Manipulations    PT Next Visit Plan spinal mobility, postural and glute strengthening, functional strengthening, hip extension ROM    PT Home Exercise Plan Medbridge Access Code: [email or web address]     Consulted and Agree with Plan of Care Patient             Patient will benefit from skilled therapeutic intervention in order to improve the following deficits and impairments:  Improper body mechanics, Pain, Decreased mobility, Increased muscle spasms, Postural dysfunction, Decreased activity tolerance, Decreased endurance, Decreased range of motion, Decreased strength, Hypomobility, Impaired perceived functional ability, Difficulty walking  Visit Diagnosis: Chronic bilateral low back pain, unspecified whether sciatica present  Pain in thoracic spine  Abnormal posture     Problem List Patient Active Problem List   Diagnosis Date Noted   Zenker's (hypopharyngeal) diverticulum 03/22/2018  Gastroesophageal reflux disease 03/22/2018   Fatty liver 10/09/2017   BPH (benign prostatic hyperplasia) 11/21/2014   Hypothyroidism 11/21/2014   Obesity 11/21/2014   Asthma 11/21/2014   Inguinal hernia 11/21/2014   OA (osteoarthritis) 11/21/2014   H/O adenomatous polyp of colon 10/14/2014   Everlean Alstrom. Graylon Good, PT, DPT 02/24/21, 3:13 PM   [facility] [address], [place], [address] Phone: [phone]   Fax:  [phone]  Name: Daniel [name] MRN: [id number] Date of Birth: 1951-05-04

## 2021-02-28 ENCOUNTER — Ambulatory Visit: Payer: Medicare Other | Admitting: Physical Therapy

## 2021-03-01 ENCOUNTER — Ambulatory Visit: Payer: Medicare Other | Attending: Family Medicine | Admitting: Physical Therapy

## 2021-03-01 ENCOUNTER — Encounter: Payer: Self-pay | Admitting: Physical Therapy

## 2021-03-01 DIAGNOSIS — M546 Pain in thoracic spine: Secondary | ICD-10-CM | POA: Diagnosis not present

## 2021-03-01 DIAGNOSIS — M545 Low back pain, unspecified: Secondary | ICD-10-CM | POA: Diagnosis not present

## 2021-03-01 DIAGNOSIS — G8929 Other chronic pain: Secondary | ICD-10-CM | POA: Diagnosis not present

## 2021-03-01 DIAGNOSIS — R293 Abnormal posture: Secondary | ICD-10-CM | POA: Insufficient documentation

## 2021-03-01 NOTE — Therapy (Signed)
[facility] [address] [place], [address] Phone: [phone]   Fax:  [phone]  Physical Therapy Treatment  Patient Details  Name: Daniel [name] MRN: [id number] Date of Birth: 08-Nov-1950 Referring Provider (PT): Jerrol Banana., MD   Encounter Date: 03/01/2021   PT End of Session - 03/01/21 1612     Visit Number 12    Number of Visits 24    Date for PT Re-Evaluation 03/23/21    Authorization Type [employer] reporting period from 02/22/2021    Progress Note Due on Visit 20    PT Start Time 1605    PT Stop Time 1645    PT Time Calculation (min) 40 min    Activity Tolerance Patient tolerated treatment well    Behavior During Therapy [facility] for tasks assessed/performed             Past Medical History:  Diagnosis Date   Actinic keratosis    Asthma    Eczema    Hx of dysplastic nevus 06/06/2006   Mid lower back, slight atypia   Hypothyroidism    Seasonal allergies    Thyroid disease     Past Surgical History:  Procedure Laterality Date   CATARACT EXTRACTION     COLON SURGERY     COLONOSCOPY WITH PROPOFOL N/A 11/18/2014   Procedure: COLONOSCOPY WITH PROPOFOL;  Surgeon: [name], MD;  Location: [facility] END[facility]COPY;  Service: Endoscopy;  Laterality: N/A;   EYE SURGERY     KNEE SURGERY     NASAL SEPTUM SURGERY      There were no vitals filed for this visit.   Subjective Assessment - 03/01/21 1605     Subjective Patient reports his back and knee felt great and did not hurt since last PT session until he spent 30 min picking up leaves and sticks in his yard. It hurt in his back really bad but then he sat and rested and he has no pain now.    Pertinent History Patient is a 70 y.o. male who presents to outpatient physical therapy with a referral for medical diagnosis acute bilateral low back pain without sciatica. This patient's chief complaints consist of low back pain with radiation into R >  L posterior glute and up to mid thoracic spine, leading to the following functional deficits: cooking for church, prolonged sitting, prolonged standing, yard work, Architectural technologist, sit <> stand, stairs, bending, lifting, staining deck, shopping, walking, social participation.    Relevant past medical history and comorbidities include asthma, Zenker's (hypopharyngeal) diverticulum, GERD, fatty liver, hypothyroidism, BPH, OA, hx adeomatous polyp of colon, obesity, inguinal hernia, hx of dysplastic nevus, colon surgery (7 inches removed of colon), cataract extraction.  Patient denies hx of cancer, stroke, seizures, lung problem, major cardiac events, diabetes, unexplained weight loss, changes in bowel or bladder problems, new onset stumbling or dropping things, spinal surgery.    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   How long can you stand comfortably? 1-1.5 hours    Diagnostic tests Lumbar radiograph report 11/30/2020: "IMPRESSION:  1. Moderate multilevel lumbar spondylosis, progressed from prior.  2. Moderate-large volume of stool throughout the visualized colon."    Patient Stated Goals "to be able to stay on my feet as long as I need to without my back  killing me"    Currently in Pain? No/denies    Pain Onset More than a month ago             TREATMENT:    Manual therapy: to reduce pain and tissue tension, improve range of motion, neuromodulation, in order to promote improved ability to complete functional activities. PRONE - CPA grade III-IV with and without KE wedge from T3-L5, 1x~20  reps/segment for improve joint motion and joint nutrition improved ranges of mobility.    Therapeutic exercise: to centralize symptoms and improve ROM, strength, muscular  endurance, and activity tolerance required for successful completion of functional activities.  - hooklying bridge with hip abduction against GTB around distal thighs, 3x20 (cued for 5 seconds, but pt holds for 1 so did more reps).  - Prone press ups: 2x10 reps - seated hip flexor stretch, 3x30 seconds each side.  - wall sags, 2x10 - bird dog, 3x10 each side plus time for practice of components. Improved form with cuing for extended LE but would benefit from further instruction at future sessions.  - inclined "Y" prone on TOTAL GYM, 3x10 AROM (states he feels it in low back).  - standing lumbar extension 1x20 - face pulls with 20# cable, 3x10 - step up to 8 inch step with overhead press using bar from omega machine, 1x10 each side (discontinued due to mild R knee pain, also has poor posture and UE bar path despite cuing).  - standing glute pull through with 20# cable, 1x10 (states he feels in low back, does not maintain neutral spine).  - prone press up 2x10 - Education on HEP including handout   Pt required multimodal cuing for proper technique and to facilitate improved neuromuscular control, strength, range of motion, and functional ability resulting in improved performance and form.     HOME EXERCISE PROGRAM Access Code: [email or web address] URL: [email or web address] Date: 01/04/2021 Prepared by: Rosita Kea   Exercises Sidelying Thoracic Rotation with Open Book - 1-2 x daily - 2 sets - 10 reps - 1 breath hold Quadruped Thoracic Rotation Full Range with Hand on Neck - 1 x daily - 2 sets - 10 reps Low Trap Setting at [place] - 1 x daily - 2 sets - 10 reps Bridge - 1 x daily - 2 sets - 10 reps - 5 seconds hold Doorway Pec Stretch at 90 Degrees Abduction - 1 x daily - 3 reps - 30 hold   [email or web address] [id number] [[id number]]  Seated Hip Flexor Stretch  -  Repeat 3 Times, Hold 30 Seconds, Perform 1 Times a Day   PT Education - 03/01/21 1618     Education Details form/technique with  exercises.    Person(s) Educated Patient    Methods Explanation;Demonstration;Tactile cues;Verbal cues    Comprehension Verbalized understanding;Returned demonstration;Verbal cues required;Tactile cues required;Need further instruction              PT Short Term Goals - 01/19/21 1126       PT SHORT TERM GOAL #1   Title Be independent with initial home exercise program for self-management of symptoms.    Baseline initial HEP provided at IE (12/29/2020);    Time 2    Period Weeks    Status Achieved    Target Date 01/13/21               PT Long Term Goals - 02/22/21 1443       PT LONG TERM GOAL #1   Title Be independent with a  long-term home exercise program for self-management of symptoms.    Baseline Initial HEP provided at IE (12/29/2020); participating in appropriate HEP (02/22/2021);    Time 12    Period Weeks    Status Partially Met   TARGET DATE FOR ALL LONG TERM GOALS: 03/23/2021     PT LONG TERM GOAL #2   Title Demonstrate improved FOTO to equal or greater than 75 by visit #10 to demonstrate improvement in overall condition and self-reported functional ability.    Baseline 63 (12/29/2020); 56 (02/22/2021);    Time 12    Period Weeks    Status On-going      PT LONG TERM GOAL #3   Title Patient will improve lumbar extension to equal or greater than 50% to improve upright posture for improved standing tolerance for cooking.    Baseline 25% (12/29/2020); 25% less pain (02/22/2021);    Time 12    Period Weeks    Status On-going      PT LONG TERM GOAL #4   Title Reduce pain with functional activities to equal or less than 1/10 to allow patient to complete usual activities including ADLs, IADLs, cooking, and social engagement with less difficulty.    Baseline 10/10 with prolonged standing (12/29/2020); 8/10 (02/22/2021);    Time 12    Period Weeks    Status Partially Met      PT LONG TERM GOAL #5   Title Complete community, work and/or recreational activities without  limitation due to current condition.    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, stairs, bending, lifting, staining deck, shopping, walking, social participation (12/29/2020); stairs improved, can stand a bit longer and walking is better (02/22/2021);    Time 12    Period Weeks    Status Partially Met                   Plan - 03/01/21 1658     Clinical Impression Statement Patient tolerated treatment well with some provocation of concordant pain with specific loading. Pain did not last and he did not have increased pain by end of session. Patient continues to demonstrate stooped posture and inadequate hip extension. Added hip flexor stretch to HEP to help address hip tightness. Continues to report pain when lumbar extensors are used.  Patient would benefit from continued management of limiting condition by skilled physical therapist to address remaining impairments and functional limitations to work towards stated goals and return to PLOF or maximal functional independence.    Personal Factors and Comorbidities Time since onset of injury/illness/exacerbation;Past/Current Experience;Comorbidity 3+    Comorbidities Relevant past medical history and comorbidities include asthma, Zenker's (hypopharyngeal) diverticulum, GERD, fatty liver, hypothyroidism, BPH, OA, hx adeomatous polyp of colon, obesity, inguinal hernia, hx of dysplastic nevus, colon surgery (7 inches removed of colon), cataract extraction.    Examination-Activity Limitations Carry;Bend;Caring for Others;Lift;Stairs;Squat;Stand;Locomotion Level;Sit;Dressing;Transfers    Examination-Participation Restrictions Church;Community Activity;Meal Prep;Interpersonal Relationship;Yard Work    Stability/Clinical Decision Making Stable/Uncomplicated    Rehab Potential Good    PT Frequency 2x / week    PT Duration 12  weeks    PT Treatment/Interventions ADLs/Self Care Home Management;Aquatic Therapy;Cryotherapy;Moist Heat;Electrical Stimulation;Gait training;Stair training;Functional mobility training;Neuromuscular re-education;Therapeutic exercise;Therapeutic activities;Patient/family education;Manual techniques;Dry needling;Passive range of motion;Joint Manipulations;Spinal Manipulations    PT Next Visit Plan spinal mobility, postural and glute strengthening, functional strengthening, hip extension ROM    PT Home Exercise Plan  Medbridge Access Code: [email or web address]    Consulted and Agree with Plan of Care Patient             Patient will benefit from skilled therapeutic intervention in order to improve the following deficits and impairments:  Improper body mechanics, Pain, Decreased mobility, Increased muscle spasms, Postural dysfunction, Decreased activity tolerance, Decreased endurance, Decreased range of motion, Decreased strength, Hypomobility, Impaired perceived functional ability, Difficulty walking  Visit Diagnosis: Chronic bilateral low back pain, unspecified whether sciatica present  Pain in thoracic spine  Abnormal posture     Problem List Patient Active Problem List   Diagnosis Date Noted   Zenker's (hypopharyngeal) diverticulum 03/22/2018   Gastroesophageal reflux disease 03/22/2018   Fatty liver 10/09/2017   BPH (benign prostatic hyperplasia) 11/21/2014   Hypothyroidism 11/21/2014   Obesity 11/21/2014   Asthma 11/21/2014   Inguinal hernia 11/21/2014   OA (osteoarthritis) 11/21/2014   H/O adenomatous polyp of colon 10/14/2014    Everlean Alstrom. Graylon Good, PT, DPT 03/01/21, 4:59 PM  [facility] [address] [place], [address] Phone: [phone]   Fax:  [phone]  Name: Daniel [name] MRN: [id number] Date of Birth: 1950-09-08

## 2021-03-02 ENCOUNTER — Encounter: Payer: Medicare Other | Admitting: Physical Therapy

## 2021-03-03 ENCOUNTER — Ambulatory Visit: Payer: Medicare Other | Admitting: Physical Therapy

## 2021-03-09 ENCOUNTER — Ambulatory Visit: Payer: Medicare Other | Admitting: Physical Therapy

## 2021-03-10 DIAGNOSIS — M25561 Pain in right knee: Secondary | ICD-10-CM | POA: Diagnosis not present

## 2021-03-10 DIAGNOSIS — M1711 Unilateral primary osteoarthritis, right knee: Secondary | ICD-10-CM | POA: Diagnosis not present

## 2021-03-14 ENCOUNTER — Encounter: Payer: Medicare Other | Admitting: Physical Therapy

## 2021-03-15 ENCOUNTER — Ambulatory Visit: Payer: Medicare Other | Admitting: Physical Therapy

## 2021-03-15 DIAGNOSIS — Z961 Presence of intraocular lens: Secondary | ICD-10-CM | POA: Diagnosis not present

## 2021-03-16 ENCOUNTER — Encounter: Payer: Medicare Other | Admitting: Physical Therapy

## 2021-03-17 ENCOUNTER — Encounter: Payer: Medicare Other | Admitting: Physical Therapy

## 2021-03-21 ENCOUNTER — Ambulatory Visit: Payer: Medicare Other | Admitting: Physical Therapy

## 2021-03-22 ENCOUNTER — Ambulatory Visit: Payer: Medicare Other | Admitting: Physical Therapy

## 2021-03-22 ENCOUNTER — Encounter: Payer: Self-pay | Admitting: Physical Therapy

## 2021-03-22 DIAGNOSIS — M546 Pain in thoracic spine: Secondary | ICD-10-CM | POA: Diagnosis not present

## 2021-03-22 DIAGNOSIS — M545 Low back pain, unspecified: Secondary | ICD-10-CM | POA: Diagnosis not present

## 2021-03-22 DIAGNOSIS — R293 Abnormal posture: Secondary | ICD-10-CM

## 2021-03-22 DIAGNOSIS — G8929 Other chronic pain: Secondary | ICD-10-CM

## 2021-03-22 NOTE — Therapy (Signed)
[facility] [address], [place], [address] Phone: [phone]   Fax:  [phone]  Physical Therapy Treatment  Patient Details  Name: Daniel [name] MRN: [id number] Date of Birth: 03/21/1951 Referring Provider (PT): Jerrol Banana., MD   Encounter Date: 03/22/2021   PT End of Session - 03/22/21 1012     Visit Number 13    Number of Visits 24    Date for PT Re-Evaluation 03/23/21    Authorization Type [employer] reporting period from 02/22/2021    Progress Note Due on Visit 20    PT Start Time 0948    PT Stop Time 1028    PT Time Calculation (min) 40 min    Activity Tolerance Patient tolerated treatment well    Behavior During Therapy [facility] for tasks assessed/performed             Past Medical History:  Diagnosis Date   Actinic keratosis    Asthma    Eczema    Hx of dysplastic nevus 06/06/2006   Mid lower back, slight atypia   Hypothyroidism    Seasonal allergies    Thyroid disease     Past Surgical History:  Procedure Laterality Date   CATARACT EXTRACTION     COLON SURGERY     COLONOSCOPY WITH PROPOFOL N/A 11/18/2014   Procedure: COLONOSCOPY WITH PROPOFOL;  Surgeon: [name], MD;  Location: [facility] ENDOSCOPY;  Service: Endoscopy;  Laterality: N/A;   EYE SURGERY     KNEE SURGERY     NASAL SEPTUM SURGERY      There were no vitals filed for this visit.   Subjective Assessment - 03/22/21 0950     Subjective Patient reports he is doing well. He has no pain upon arrival but states his back still hurts him with prolonged standing and it feels weak so he does not feel ready to discharge from PT. States his back feels better after he sits for a while and then feels better when standing after. HEP is going well.    Pertinent History Patient is a 70 y.o. male who presents to outpatient physical therapy with a referral for medical diagnosis acute bilateral low back pain without sciatica. This  patient's chief complaints consist of low back pain with radiation into R > L posterior glute and up to mid thoracic spine, leading to the following functional deficits: cooking for church, prolonged sitting, prolonged standing, yard work, Architectural technologist, sit <> stand, stairs, bending, lifting, staining deck, shopping, walking, social participation.    Relevant past medical history and comorbidities include asthma, Zenker's (hypopharyngeal) diverticulum, GERD, fatty liver, hypothyroidism, BPH, OA, hx adeomatous polyp of colon, obesity, inguinal hernia, hx of dysplastic nevus, colon surgery (7 inches removed of colon), cataract extraction.  Patient denies hx of cancer, stroke, seizures, lung problem, major cardiac events, diabetes, unexplained weight loss, changes in bowel or bladder problems, new onset stumbling or dropping things, spinal surgery.    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   How long can you stand comfortably? 1-1.5 hours    Diagnostic tests Lumbar radiograph report 11/30/2020: "IMPRESSION:  1. Moderate multilevel lumbar spondylosis, progressed from prior.  2. Moderate-large volume of stool throughout the visualized colon."    Patient Stated Goals "to be able to stay on my  feet as long as I need to without my back killing me"    Currently in Pain? No/denies    Pain Onset More than a month ago              TREATMENT:    Manual therapy: to reduce pain and tissue tension, improve range of motion, neuromodulation, in order to promote improved ability to complete functional activities. PRONE - CPA grade III-IV with KE wedge from T3-L5, 1x~20  reps/segment for improve joint motion and joint nutrition improved ranges of mobility.    Therapeutic exercise: to  centralize symptoms and improve ROM, strength, muscular endurance, and activity tolerance required for successful completion of functional activities.  - wall sags, 2x10 - standing lumbar extension against  table, 1x20 (Manual therapy - see above)  - prone press up 2x10 - seated hip flexor stretch, 3x30 seconds each side (needed cuing to remember technique).  - standing alternating TRX lunge to warrior 1 position, 3x10 each side, 5 second holds.  - inclined "Y" prone on TOTAL GYM, 3x10 AROM (states he feels it in low back).  - bird dog, 3x10 each side, short hold. Improved LE extension this session.  - glute thrusts sitting on airex on floor with back on plinth with pillow under upper back to improve comfort. 3x10.  - standing face pulls with 20# cable, 3x10 - seated lat pull with wide grip, 3x10 @ 35# - Education on HEP including handout    Pt required multimodal cuing for proper technique and to facilitate improved neuromuscular control, strength, range of motion, and functional ability resulting in improved performance and form.   HOME EXERCISE PROGRAM Access Code: [email or web address] URL: [email or web address] Date: 03/22/2021 Prepared by: Rosita Kea  Exercises Sidelying Thoracic Rotation with Open Book - 1-2 x daily - 2 sets - 10 reps - 1 breath hold Quadruped Thoracic Rotation Full Range with Hand on Neck - 1 x daily - 2 sets - 10 reps Bird Dog - 1 x daily - 3 sets - 10 reps - 1-5 seconds hold Doorway Pec Stretch at 90 Degrees Abduction - 1 x daily - 3 reps - 30 hold Standing Lumbar Extension at [place] 4 x daily - 2 sets - 10 reps Standing Lumbar Extension with Counter - 4 x daily - 2 sets - 10 reps Supine Hip Extension on Bench - 1 x daily - 3 sets - 10 reps - 5 seconds hold  [email or web address] om [id number] [[id number]]   Seated Hip Flexor Stretch  -  Repeat 3 Times, Hold 30 Seconds, Perform 1 Times a Day     PT Education - 03/22/21 1024     Education Details  form/technique with exercises.    Person(s) Educated Patient    Methods Explanation;Demonstration;Tactile cues;Verbal cues    Comprehension Verbalized understanding;Returned demonstration;Verbal cues required;Tactile cues required;Need further instruction              PT Short Term Goals - 01/19/21 1126       PT SHORT TERM GOAL #1   Title Be independent with initial home exercise program for self-management of symptoms.    Baseline initial HEP provided at IE (12/29/2020);    Time 2    Period Weeks    Status Achieved    Target Date 01/13/21               PT Long Term Goals - 02/22/21 1443       PT LONG TERM GOAL #1  Title Be independent with a long-term home exercise program for self-management of symptoms.    Baseline Initial HEP provided at IE (12/29/2020); participating in appropriate HEP (02/22/2021);    Time 12    Period Weeks    Status Partially Met   TARGET DATE FOR ALL LONG TERM GOALS: 03/23/2021     PT LONG TERM GOAL #2   Title Demonstrate improved FOTO to equal or greater than 75 by visit #10 to demonstrate improvement in overall condition and self-reported functional ability.    Baseline 63 (12/29/2020); 56 (02/22/2021);    Time 12    Period Weeks    Status On-going      PT LONG TERM GOAL #3   Title Patient will improve lumbar extension to equal or greater than 50% to improve upright posture for improved standing tolerance for cooking.    Baseline 25% (12/29/2020); 25% less pain (02/22/2021);    Time 12    Period Weeks    Status On-going      PT LONG TERM GOAL #4   Title Reduce pain with functional activities to equal or less than 1/10 to allow patient to complete usual activities including ADLs, IADLs, cooking, and social engagement with less difficulty.    Baseline 10/10 with prolonged standing (12/29/2020); 8/10 (02/22/2021);    Time 12    Period Weeks    Status Partially Met      PT LONG TERM GOAL #5   Title Complete community, work and/or recreational  activities without limitation due to current condition.    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, stairs, bending, lifting, staining deck, shopping, walking, social participation (12/29/2020); stairs improved, can stand a bit longer and walking is better (02/22/2021);    Time 12    Period Weeks    Status Partially Met                   Plan - 03/22/21 1038     Clinical Impression Statement Patient tolerated treatment well with no increased pain by end of session. Continues to have difficulty breaking motor patterns encouraging slumped upper back and has stiffness that limits upright posture. Updated HEP to include glute thrust due to good form demonstrated in clinic with minimal compensations. Patient continues to be limited in standing activities due to back pain and stiffness. Patient would benefit from continued management of limiting condition by skilled physical therapist to address remaining impairments and functional limitations to work towards stated goals and return to PLOF or maximal functional independence.    Personal Factors and Comorbidities Time since onset of injury/illness/exacerbation;Past/Current Experience;Comorbidity 3+    Comorbidities Relevant past medical history and comorbidities include asthma, Zenker's (hypopharyngeal) diverticulum, GERD, fatty liver, hypothyroidism, BPH, OA, hx adeomatous polyp of colon, obesity, inguinal hernia, hx of dysplastic nevus, colon surgery (7 inches removed of colon), cataract extraction.    Examination-Activity Limitations Carry;Bend;Caring for Others;Lift;Stairs;Squat;Stand;Locomotion Level;Sit;Dressing;Transfers    Examination-Participation Restrictions Church;Community Activity;Meal Prep;Interpersonal Relationship;Yard Work    Stability/Clinical Decision Making Stable/Uncomplicated    Rehab Potential Good     PT Frequency 2x / week    PT Duration 12 weeks    PT Treatment/Interventions ADLs/Self Care Home Management;Aquatic Therapy;Cryotherapy;Moist Heat;Electrical Stimulation;Gait training;Stair training;Functional mobility training;Neuromuscular re-education;Therapeutic exercise;Therapeutic activities;Patient/family education;Manual techniques;Dry needling;Passive range of motion;Joint Manipulations;Spinal Manipulations    PT Next Visit Plan spinal mobility, postural and glute strengthening, functional strengthening, hip extension ROM  PT Home Exercise Plan Medbridge Access Code: [email or web address]    Consulted and Agree with Plan of Care Patient             Patient will benefit from skilled therapeutic intervention in order to improve the following deficits and impairments:  Improper body mechanics, Pain, Decreased mobility, Increased muscle spasms, Postural dysfunction, Decreased activity tolerance, Decreased endurance, Decreased range of motion, Decreased strength, Hypomobility, Impaired perceived functional ability, Difficulty walking  Visit Diagnosis: Chronic bilateral low back pain, unspecified whether sciatica present  Pain in thoracic spine  Abnormal posture     Problem List Patient Active Problem List   Diagnosis Date Noted   Zenker's (hypopharyngeal) diverticulum 03/22/2018   Gastroesophageal reflux disease 03/22/2018   Fatty liver 10/09/2017   BPH (benign prostatic hyperplasia) 11/21/2014   Hypothyroidism 11/21/2014   Obesity 11/21/2014   Asthma 11/21/2014   Inguinal hernia 11/21/2014   OA (osteoarthritis) 11/21/2014   H/O adenomatous polyp of colon 10/14/2014    Everlean Alstrom. Graylon Good, PT, DPT 03/22/21, 10:39 AM   [facility] [address], [place], [address] Phone: [phone]   Fax:  [phone]  Name: HASNAIN MANHEIM MRN: [id number] Date of Birth: 1950/06/23

## 2021-03-23 ENCOUNTER — Ambulatory Visit (INDEPENDENT_AMBULATORY_CARE_PROVIDER_SITE_OTHER): Payer: Medicare Other

## 2021-03-23 ENCOUNTER — Other Ambulatory Visit: Payer: Self-pay

## 2021-03-23 DIAGNOSIS — Z23 Encounter for immunization: Secondary | ICD-10-CM

## 2021-03-24 ENCOUNTER — Ambulatory Visit: Payer: Medicare Other | Admitting: Physical Therapy

## 2021-03-24 ENCOUNTER — Encounter: Payer: Self-pay | Admitting: Physical Therapy

## 2021-03-24 DIAGNOSIS — R293 Abnormal posture: Secondary | ICD-10-CM | POA: Diagnosis not present

## 2021-03-24 DIAGNOSIS — G8929 Other chronic pain: Secondary | ICD-10-CM

## 2021-03-24 DIAGNOSIS — M545 Low back pain, unspecified: Secondary | ICD-10-CM

## 2021-03-24 DIAGNOSIS — M546 Pain in thoracic spine: Secondary | ICD-10-CM | POA: Diagnosis not present

## 2021-03-24 NOTE — Therapy (Signed)
[facility] [address], [place], [address] Phone: [phone]   Fax:  [phone]  Physical Therapy Treatment / Progress Note / Re-Certification Dates of reporting from 02/22/2021 - 03/24/2021  Patient Details  Name: Daniel [name] MRN: [id number] Date of Birth: 1950/07/09 Referring Provider (PT): Jerrol Banana., MD   Encounter Date: 03/24/2021   PT End of Session - 03/24/21 1552     Visit Number 14    Number of Visits 38    Date for PT Re-Evaluation 06/16/21    Authorization Type [employer] reporting period from 02/22/2021    Progress Note Due on Visit 20    PT Start Time 1518    PT Stop Time 1558    PT Time Calculation (min) 40 min    Activity Tolerance Patient tolerated treatment well    Behavior During Therapy [facility] for tasks assessed/performed             Past Medical History:  Diagnosis Date   Actinic keratosis    Asthma    Eczema    Hx of dysplastic nevus 06/06/2006   Mid lower back, slight atypia   Hypothyroidism    Seasonal allergies    Thyroid disease     Past Surgical History:  Procedure Laterality Date   CATARACT EXTRACTION     COLON SURGERY     COLONOSCOPY WITH PROPOFOL N/A 11/18/2014   Procedure: COLONOSCOPY WITH PROPOFOL;  Surgeon: [name], MD;  Location: [facility] ENDOSCOPY;  Service: Endoscopy;  Laterality: N/A;   EYE SURGERY     KNEE SURGERY     NASAL SEPTUM SURGERY      There were no vitals filed for this visit.   Subjective Assessment - 03/24/21 1521     Subjective Patient reports the highest level of pain he has had in the last couple of week is 5-6/10. He has no pain upon arrival but feels really stiff after sitting for 1 hour waiting for someone for lunch. Patient reports he felt really good yesterday .    Pertinent History Patient is a 70 y.o. male who presents to outpatient physical therapy with a referral for medical diagnosis acute bilateral low back  pain without sciatica. This patient's chief complaints consist of low back pain with radiation into R > L posterior glute and up to mid thoracic spine, leading to the following functional deficits: cooking for church, prolonged sitting, prolonged standing, yard work, Architectural technologist, sit <> stand, stairs, bending, lifting, staining deck, shopping, walking, social participation.    Relevant past medical history and comorbidities include asthma, Zenker's (hypopharyngeal) diverticulum, GERD, fatty liver, hypothyroidism, BPH, OA, hx adeomatous polyp of colon, obesity, inguinal hernia, hx of dysplastic nevus, colon surgery (7 inches removed of colon), cataract extraction.  Patient denies hx of cancer, stroke, seizures, lung problem, major cardiac events, diabetes, unexplained weight loss, changes in bowel or bladder problems, new onset stumbling or dropping things, spinal surgery.    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   How long can you stand comfortably? 1-1.5 hours    Diagnostic tests Lumbar radiograph report 11/30/2020: "IMPRESSION:  1. Moderate multilevel lumbar spondylosis, progressed from prior.  2. Moderate-large volume of stool throughout the visualized colon."    Patient Stated Goals "to be able to stay on my  feet as long as I need to without my back killing me"    Currently in Pain? No/denies    Pain Onset More than a month ago            OBJECTIVE  SELF-REPORTED FUNCTION FOTO score: 65/100 (lumbar spine questionnaire)  LUMBAR AROM Extension: 35% with pain across low back.    TREATMENT:    Manual therapy: to reduce pain and tissue tension, improve range of motion, neuromodulation, in order to promote improved ability to complete functional  activities. PRONE - CPA grade III-IV with KE wedge from T3-L5, 1x~20  reps/segment for improve joint motion and joint nutrition improved ranges of mobility.    Therapeutic exercise: to centralize symptoms and improve ROM, strength, muscular endurance, and activity tolerance required for successful completion of functional activities.  - wall sags, 1x20 - standing lumbar extension against  table, 1x18 (stopped due to R knee grinding).  (Manual therapy - see above)  - prone press up 2x10 - seated hip flexor stretch, 3x30 seconds each side (needed cuing to improve technique).  - standing alternating TRX lunge to warrior 1 position, 3x10 each side (noted for popping in R knee).  - inclined "Y" prone on TOTAL GYM, 3x10 with 1# DB. - bird dog, 3x10 each side, short hold. - glute thrusts sitting on airex on floor with back on plinth with pillow under upper back to improve comfort. 3x10.   Pt required multimodal cuing for proper technique and to facilitate improved neuromuscular control, strength, range of motion, and functional ability resulting in improved performance and form.   HOME EXERCISE PROGRAM Access Code: [email or web address] URL: [email or web address] Date: 03/22/2021 Prepared by: Rosita Kea   Exercises Sidelying Thoracic Rotation with Open Book - 1-2 x daily - 2 sets - 10 reps - 1 breath hold Quadruped Thoracic Rotation Full Range with Hand on Neck - 1 x daily - 2 sets - 10 reps Bird Dog - 1 x daily - 3 sets - 10 reps - 1-5 seconds hold Doorway Pec Stretch at 90 Degrees Abduction - 1 x daily - 3 reps - 30 hold Standing Lumbar Extension at [place] 4 x daily - 2 sets - 10 reps Standing Lumbar Extension with Counter - 4 x daily - 2 sets - 10 reps Supine Hip Extension on Bench - 1 x daily - 3 sets - 10 reps - 5 seconds hold   [email or web address] om [id number] [[id number]]   Seated Hip Flexor Stretch  -  Repeat 3 Times, Hold 30 Seconds, Perform 1 Times a Day    PT Education - 03/24/21  1713     Education Details form/technique with exercises.    Person(s) Educated Patient    Methods Explanation;Demonstration;Tactile cues;Verbal cues    Comprehension Verbalized understanding;Returned demonstration;Verbal cues required;Tactile cues required;Need further instruction              PT Short Term Goals - 01/19/21 1126       PT SHORT TERM GOAL #1   Title Be independent with initial home exercise program for self-management of symptoms.    Baseline initial HEP provided at IE (12/29/2020);    Time 2    Period Weeks    Status Achieved    Target Date 01/13/21               PT Long Term Goals - 03/24/21 1558       PT LONG TERM GOAL #1   Title Be independent with a long-term  home exercise program for self-management of symptoms.    Baseline Initial HEP provided at IE (12/29/2020); participating in appropriate HEP (02/22/2021); continues to participate in appropriate HEP with some exceptions when busy (03/24/2021);    Time 12    Period Weeks    Status Partially Met   TARGET DATE FOR ALL LONG TERM GOALS: 03/23/2021. Updated to 06/16/2021 for unmet goals.     PT LONG TERM GOAL #2   Title Demonstrate improved FOTO to equal or greater than 75 by visit #10 to demonstrate improvement in overall condition and self-reported functional ability.    Baseline 63 (12/29/2020); 56 (02/22/2021); 65 at visit #14 (03/24/2021)    Time 12    Period Weeks    Status Partially Met      PT LONG TERM GOAL #3   Title Patient will improve lumbar extension to equal or greater than 50% to improve upright posture for improved standing tolerance for cooking.    Baseline 25% (12/29/2020); 25% less pain (02/22/2021); Extension: 35% with pain across low back.(03/24/2021);    Time 12    Period Weeks    Status Partially Met      PT LONG TERM GOAL #4   Title Reduce pain with functional activities to equal or less than 1/10 to allow patient to complete usual activities including ADLs, IADLs, cooking, and  social engagement with less difficulty.    Baseline 10/10 with prolonged standing (12/29/2020); 8/10 (02/22/2021); 6/10 while traveling (03/24/2021);    Time 12    Period Weeks    Status Partially Met      PT LONG TERM GOAL #5   Title Complete community, work and/or recreational activities without limitation due to current condition.    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, stairs, bending, lifting, staining deck, shopping, walking, social participation (12/29/2020); stairs improved, can stand a bit longer and walking is better (02/22/2021); reports improved tolerance for functional activities (03/24/2021);    Time 12    Period Weeks    Status Partially Met                   Plan - 03/24/21 1716     Clinical Impression Statement Patient has attended 14 physical therapy sessions this episode of care. He has had some difficulty with regular attendance due to other responsibilities which has delayed his progress some. Overall, patient reports improved self-reported function, subjective improvements including improved ability to stand and travel, and demonstrates mild improvement in lumbar extension ROM. Patient continues to be limited by back pain and poor postural endurance, strength, and stiffness. Patient would benefit from continued management of limiting condition by skilled physical therapist to address remaining impairments and functional limitations to work towards stated goals and return to PLOF or maximal functional independence.    Personal Factors and Comorbidities Time since onset of injury/illness/exacerbation;Past/Current Experience;Comorbidity 3+    Comorbidities Relevant past medical history and comorbidities include asthma, Zenker's (hypopharyngeal) diverticulum, GERD, fatty liver, hypothyroidism, BPH, OA, hx adeomatous polyp of colon, obesity,  inguinal hernia, hx of dysplastic nevus, colon surgery (7 inches removed of colon), cataract extraction.    Examination-Activity Limitations Carry;Bend;Caring for Others;Lift;Stairs;Squat;Stand;Locomotion Level;Sit;Dressing;Transfers    Examination-Participation Restrictions Church;Community Activity;Meal Prep;Interpersonal Relationship;Yard Work    Stability/Clinical Decision Making Stable/Uncomplicated    Rehab Potential Good    PT Frequency 2x / week    PT Duration 12 weeks  PT Treatment/Interventions ADLs/Self Care Home Management;Aquatic Therapy;Cryotherapy;Moist Heat;Electrical Stimulation;Gait training;Stair training;Functional mobility training;Neuromuscular re-education;Therapeutic exercise;Therapeutic activities;Patient/family education;Manual techniques;Dry needling;Passive range of motion;Joint Manipulations;Spinal Manipulations    PT Next Visit Plan spinal mobility, postural and glute strengthening, functional strengthening, hip extension ROM    PT Home Exercise Plan Medbridge Access Code: [email or web address]    Consulted and Agree with Plan of Care Patient             Patient will benefit from skilled therapeutic intervention in order to improve the following deficits and impairments:  Improper body mechanics, Pain, Decreased mobility, Increased muscle spasms, Postural dysfunction, Decreased activity tolerance, Decreased endurance, Decreased range of motion, Decreased strength, Hypomobility, Impaired perceived functional ability, Difficulty walking  Visit Diagnosis: Chronic bilateral low back pain, unspecified whether sciatica present  Pain in thoracic spine  Abnormal posture     Problem List Patient Active Problem List   Diagnosis Date Noted   Zenker's (hypopharyngeal) diverticulum 03/22/2018   Gastroesophageal reflux disease 03/22/2018   Fatty liver 10/09/2017   BPH (benign prostatic hyperplasia) 11/21/2014   Hypothyroidism 11/21/2014   Obesity 11/21/2014   Asthma  11/21/2014   Inguinal hernia 11/21/2014   OA (osteoarthritis) 11/21/2014   H/O adenomatous polyp of colon 10/14/2014    Everlean Alstrom. Graylon Good, PT, DPT 03/24/21, 5:16 PM   [facility] [address] [place], [address] Phone: [phone]   Fax:  [phone]  Name: Daniel [name] MRN: [id number] Date of Birth: 12-06-1950

## 2021-03-29 ENCOUNTER — Ambulatory Visit: Payer: Medicare Other | Admitting: Physical Therapy

## 2021-03-31 ENCOUNTER — Ambulatory Visit: Payer: Medicare Other | Attending: Family Medicine | Admitting: Physical Therapy

## 2021-03-31 ENCOUNTER — Encounter: Payer: Self-pay | Admitting: Physical Therapy

## 2021-03-31 DIAGNOSIS — M545 Low back pain, unspecified: Secondary | ICD-10-CM | POA: Insufficient documentation

## 2021-03-31 DIAGNOSIS — M546 Pain in thoracic spine: Secondary | ICD-10-CM | POA: Insufficient documentation

## 2021-03-31 DIAGNOSIS — G8929 Other chronic pain: Secondary | ICD-10-CM | POA: Diagnosis not present

## 2021-03-31 DIAGNOSIS — R293 Abnormal posture: Secondary | ICD-10-CM | POA: Insufficient documentation

## 2021-03-31 NOTE — Therapy (Signed)
[facility] [address] [place], [address] Phone: [phone]   Fax:  [phone]  Physical Therapy Treatment  Patient Details  Name: Daniel [name] MRN: [id number] Date of Birth: 10/02/1950 Referring Provider (PT): Jerrol Banana., MD   Encounter Date: 03/31/2021   PT End of Session - 03/31/21 1549     Visit Number 15    Number of Visits 38    Date for PT Re-Evaluation 06/16/21    Authorization Type [employer] reporting period from 02/22/2021    Progress Note Due on Visit 20    PT Start Time 1515    PT Stop Time 1555    PT Time Calculation (min) 40 min    Activity Tolerance Patient tolerated treatment well    Behavior During Therapy [facility] for tasks assessed/performed             Past Medical History:  Diagnosis Date   Actinic keratosis    Asthma    Eczema    Hx of dysplastic nevus 06/06/2006   Mid lower back, slight atypia   Hypothyroidism    Seasonal allergies    Thyroid disease     Past Surgical History:  Procedure Laterality Date   CATARACT EXTRACTION     COLON SURGERY     COLONOSCOPY WITH PROPOFOL N/A 11/18/2014   Procedure: COLONOSCOPY WITH PROPOFOL;  Surgeon: [name], MD;  Location: [facility] ENDOSCOPY;  Service: Endoscopy;  Laterality: N/A;   EYE SURGERY     KNEE SURGERY     NASAL SEPTUM SURGERY      There were no vitals filed for this visit.   Subjective Assessment - 03/31/21 1521     Subjective Patient reports he is tired from being too busy but his back does not hurt upon arrival. States he felt [name] after last PT session.  He missed his last PT session due to a meeting. States he feels like physical therapy is helping him and he wants to continue coming.    Pertinent History Patient is a 70 y.o. male who presents to outpatient physical therapy with a referral for medical diagnosis acute bilateral low back pain without sciatica. This patient's chief complaints consist of  low back pain with radiation into R > L posterior glute and up to mid thoracic spine, leading to the following functional deficits: cooking for church, prolonged sitting, prolonged standing, yard work, Architectural technologist, sit <> stand, stairs, bending, lifting, staining deck, shopping, walking, social participation.    Relevant past medical history and comorbidities include asthma, Zenker's (hypopharyngeal) diverticulum, GERD, fatty liver, hypothyroidism, BPH, OA, hx adeomatous polyp of colon, obesity, inguinal hernia, hx of dysplastic nevus, colon surgery (7 inches removed of colon), cataract extraction.  Patient denies hx of cancer, stroke, seizures, lung problem, major cardiac events, diabetes, unexplained weight loss, changes in bowel or bladder problems, new onset stumbling or dropping things, spinal surgery.    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   How long can you stand comfortably? 1-1.5 hours    Diagnostic tests Lumbar radiograph report 11/30/2020: "IMPRESSION:  1. Moderate multilevel lumbar spondylosis, progressed from prior.  2. Moderate-large volume of stool throughout the visualized colon."    Patient Stated Goals "to be able to stay on my feet as long as I need to  without my back killing me"    Currently in Pain? No/denies    Pain Onset More than a month ago              TREATMENT:    Manual therapy: to reduce pain and tissue tension, improve range of motion, neuromodulation, in order to promote improved ability to complete functional activities. PRONE - CPA grade III-IV with KE wedge from T3-L5, 1x~20  reps/segment for improve joint motion and joint nutrition improved ranges of mobility.    Therapeutic exercise: to centralize symptoms and improve ROM,  strength, muscular endurance, and activity tolerance required for successful completion of functional activities.  - wall sags, 1x20 - standing lumbar extension against  table, 1x20  (Manual therapy - see above)  - prone press up 2x10 - seated hip flexor stretch, 3x30 seconds each side (needed cuing to improve technique).  - standing alternating TRX lunge to warrior 1 position, 3x5 each side with metronome to improve 5 second hold.  - inclined "Y" prone on TOTAL GYM, 3x10 with 2# DB. - bird dog, 3x10 each side, 3 second hold with metronome. - glute thrusts sitting on airex on floor with back on plinth with pillow under upper back to improve comfort. 3x10.    Pt required multimodal cuing for proper technique and to facilitate improved neuromuscular control, strength, range of motion, and functional ability resulting in improved performance and form.   HOME EXERCISE PROGRAM Access Code: [email or web address] URL: [email or web address] Date: 03/22/2021 Prepared by: Rosita Kea   Exercises Sidelying Thoracic Rotation with Open Book - 1-2 x daily - 2 sets - 10 reps - 1 breath hold Quadruped Thoracic Rotation Full Range with Hand on Neck - 1 x daily - 2 sets - 10 reps Bird Dog - 1 x daily - 3 sets - 10 reps - 1-5 seconds hold Doorway Pec Stretch at 90 Degrees Abduction - 1 x daily - 3 reps - 30 hold Standing Lumbar Extension at [place] 4 x daily - 2 sets - 10 reps Standing Lumbar Extension with Counter - 4 x daily - 2 sets - 10 reps Supine Hip Extension on Bench - 1 x daily - 3 sets - 10 reps - 5 seconds hold   [email or web address] [id number] [[id number]]   Seated Hip Flexor Stretch  -  Repeat 3 Times, Hold 30 Seconds, Perform 1 Times a Day   PT Education - 03/31/21 1548     Education Details form/technique with exercises    Person(s) Educated Patient    Methods Explanation;Demonstration;Tactile cues;Verbal cues    Comprehension Verbalized understanding;Returned demonstration;Verbal cues  required;Tactile cues required;Need further instruction              PT Short Term Goals - 01/19/21 1126       PT SHORT TERM GOAL #1   Title Be independent with initial home exercise program for self-management of symptoms.    Baseline initial HEP provided at IE (12/29/2020);    Time 2    Period Weeks    Status Achieved    Target Date 01/13/21               PT Long Term Goals - 03/24/21 1558       PT LONG TERM GOAL #1   Title Be independent with a long-term home exercise program for self-management of symptoms.    Baseline Initial HEP provided at IE (12/29/2020); participating in appropriate HEP (02/22/2021); continues to participate in appropriate HEP with some  exceptions when busy (03/24/2021);    Time 12    Period Weeks    Status Partially Met   TARGET DATE FOR ALL LONG TERM GOALS: 03/23/2021. Updated to 06/16/2021 for unmet goals.     PT LONG TERM GOAL #2   Title Demonstrate improved FOTO to equal or greater than 75 by visit #10 to demonstrate improvement in overall condition and self-reported functional ability.    Baseline 63 (12/29/2020); 56 (02/22/2021); 65 at visit #14 (03/24/2021)    Time 12    Period Weeks    Status Partially Met      PT LONG TERM GOAL #3   Title Patient will improve lumbar extension to equal or greater than 50% to improve upright posture for improved standing tolerance for cooking.    Baseline 25% (12/29/2020); 25% less pain (02/22/2021); Extension: 35% with pain across low back.(03/24/2021);    Time 12    Period Weeks    Status Partially Met      PT LONG TERM GOAL #4   Title Reduce pain with functional activities to equal or less than 1/10 to allow patient to complete usual activities including ADLs, IADLs, cooking, and social engagement with less difficulty.    Baseline 10/10 with prolonged standing (12/29/2020); 8/10 (02/22/2021); 6/10 while traveling (03/24/2021);    Time 12    Period Weeks    Status Partially Met      PT LONG TERM GOAL #5    Title Complete community, work and/or recreational activities without limitation due to current condition.    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, stairs, bending, lifting, staining deck, shopping, walking, social participation (12/29/2020); stairs improved, can stand a bit longer and walking is better (02/22/2021); reports improved tolerance for functional activities (03/24/2021);    Time 12    Period Weeks    Status Partially Met                   Plan - 03/31/21 1544     Clinical Impression Statement Patient tolerated treatment well overall and had improved hold times during some of his exercises with use of metronome to help with counting time. Patient continues to struggle with upright posture but reports improving pain and activity tolerance. Patient would benefit from continued management of limiting condition by skilled physical therapist to address remaining impairments and functional limitations to work towards stated goals and return to PLOF or maximal functional independence.    Personal Factors and Comorbidities Time since onset of injury/illness/exacerbation;Past/Current Experience;Comorbidity 3+    Comorbidities Relevant past medical history and comorbidities include asthma, Zenker's (hypopharyngeal) diverticulum, GERD, fatty liver, hypothyroidism, BPH, OA, hx adeomatous polyp of colon, obesity, inguinal hernia, hx of dysplastic nevus, colon surgery (7 inches removed of colon), cataract extraction.    Examination-Activity Limitations Carry;Bend;Caring for Others;Lift;Stairs;Squat;Stand;Locomotion Level;Sit;Dressing;Transfers    Examination-Participation Restrictions Church;Community Activity;Meal Prep;Interpersonal Relationship;Yard Work    Stability/Clinical Decision Making Stable/Uncomplicated    Rehab Potential [name]    PT Frequency 2x / week     PT Duration 12 weeks    PT Treatment/Interventions ADLs/Self Care Home Management;Aquatic Therapy;Cryotherapy;Moist Heat;Electrical Stimulation;Gait training;Stair training;Functional mobility training;Neuromuscular re-education;Therapeutic exercise;Therapeutic activities;Patient/family education;Manual techniques;Dry needling;Passive range of motion;Joint Manipulations;Spinal Manipulations    PT Next Visit Plan spinal mobility, postural and glute strengthening, functional strengthening, hip extension ROM    PT Home Exercise Plan Medbridge Access Code: [email or web address]    Consulted and  Agree with Plan of Care Patient             Patient will benefit from skilled therapeutic intervention in order to improve the following deficits and impairments:  Improper body mechanics, Pain, Decreased mobility, Increased muscle spasms, Postural dysfunction, Decreased activity tolerance, Decreased endurance, Decreased range of motion, Decreased strength, Hypomobility, Impaired perceived functional ability, Difficulty walking  Visit Diagnosis: Chronic bilateral low back pain, unspecified whether sciatica present  Pain in thoracic spine  Abnormal posture     Problem List Patient Active Problem List   Diagnosis Date Noted   Zenker's (hypopharyngeal) diverticulum 03/22/2018   Gastroesophageal reflux disease 03/22/2018   Fatty liver 10/09/2017   BPH (benign prostatic hyperplasia) 11/21/2014   Hypothyroidism 11/21/2014   Obesity 11/21/2014   Asthma 11/21/2014   Inguinal hernia 11/21/2014   OA (osteoarthritis) 11/21/2014   H/O adenomatous polyp of colon 10/14/2014    Daniel [name], PT, DPT 03/31/21, 3:57 PM   [facility] [address] [place], [address] Phone: [phone]   Fax:  [phone]  Name: Daniel [name] MRN: [id number] Date of Birth: Mar 02, 1951

## 2021-04-05 ENCOUNTER — Ambulatory Visit: Payer: Medicare Other | Admitting: Physical Therapy

## 2021-04-05 ENCOUNTER — Encounter: Payer: Medicare Other | Admitting: Physical Therapy

## 2021-04-07 ENCOUNTER — Encounter: Payer: Medicare Other | Admitting: Physical Therapy

## 2021-04-12 ENCOUNTER — Ambulatory Visit: Payer: Medicare Other | Admitting: Physical Therapy

## 2021-04-14 ENCOUNTER — Ambulatory Visit: Payer: Medicare Other | Admitting: Physical Therapy

## 2021-04-18 ENCOUNTER — Encounter: Payer: Medicare Other | Admitting: Physical Therapy

## 2021-04-26 ENCOUNTER — Other Ambulatory Visit: Payer: Self-pay

## 2021-04-26 ENCOUNTER — Encounter: Payer: Self-pay | Admitting: Physical Therapy

## 2021-04-26 ENCOUNTER — Ambulatory Visit: Payer: Medicare Other | Admitting: Physical Therapy

## 2021-04-26 DIAGNOSIS — R293 Abnormal posture: Secondary | ICD-10-CM

## 2021-04-26 DIAGNOSIS — M545 Low back pain, unspecified: Secondary | ICD-10-CM

## 2021-04-26 DIAGNOSIS — G8929 Other chronic pain: Secondary | ICD-10-CM | POA: Diagnosis not present

## 2021-04-26 DIAGNOSIS — M546 Pain in thoracic spine: Secondary | ICD-10-CM

## 2021-04-26 NOTE — Therapy (Signed)
[facility] [facility] [address], [place], [address] Phone: [phone]   Fax:  [phone]  Physical Therapy Treatment  Patient Details  Name: Daniel [name] MRN: [id number] Date of Birth: 22-Dec-1950 Referring Provider (PT): Jerrol Banana., MD   Encounter Date: 04/26/2021   PT End of Session - 04/26/21 1525     Visit Number 16    Number of Visits 46    Date for PT Re-Evaluation 06/16/21    Authorization Type [employer] reporting period from 02/22/2021    Progress Note Due on Visit 20    PT Start Time 1520    PT Stop Time 1558    PT Time Calculation (min) 38 min    Activity Tolerance Patient tolerated treatment well    Behavior During Therapy [facility] for tasks assessed/performed             Past Medical History:  Diagnosis Date   Actinic keratosis    Asthma    Eczema    Hx of dysplastic nevus 06/06/2006   Mid lower back, slight atypia   Hypothyroidism    Seasonal allergies    Thyroid disease     Past Surgical History:  Procedure Laterality Date   CATARACT EXTRACTION     COLON SURGERY     COLONOSCOPY WITH PROPOFOL N/A 11/18/2014   Procedure: COLONOSCOPY WITH PROPOFOL;  Surgeon: [name], MD;  Location: [facility] ENDOSCOPY;  Service: Endoscopy;  Laterality: N/A;   EYE SURGERY     KNEE SURGERY     NASAL SEPTUM SURGERY      There were no vitals filed for this visit.   Subjective Assessment - 04/26/21 1524     Subjective Patient reports he is feeling well. He reports no back pain upon arrival but it still "feels weak." He has been very busy with church business that has kept him from PT but that is calming down now.    Pertinent History Patient is a 70 y.o. male who presents to outpatient physical therapy with a referral for medical diagnosis acute bilateral low back pain without sciatica. This patient's chief complaints consist of low back pain with radiation into R > L posterior glute and up to  mid thoracic spine, leading to the following functional deficits: cooking for church, prolonged sitting, prolonged standing, yard work, Architectural technologist, sit <> stand, stairs, bending, lifting, staining deck, shopping, walking, social participation.    Relevant past medical history and comorbidities include asthma, Zenker's (hypopharyngeal) diverticulum, GERD, fatty liver, hypothyroidism, BPH, OA, hx adeomatous polyp of colon, obesity, inguinal hernia, hx of dysplastic nevus, colon surgery (7 inches removed of colon), cataract extraction.  Patient denies hx of cancer, stroke, seizures, lung problem, major cardiac events, diabetes, unexplained weight loss, changes in bowel or bladder problems, new onset stumbling or dropping things, spinal surgery.    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   How long can you stand comfortably? 1-1.5 hours    Diagnostic tests Lumbar radiograph report 11/30/2020: "IMPRESSION:  1. Moderate multilevel lumbar spondylosis, progressed from prior.  2. Moderate-large volume of stool throughout the visualized colon."    Patient Stated Goals "to be able to stay on my feet as long as I need to without my back killing me"    Currently in Pain? No/denies  Pain Onset More than a month ago                TREATMENT:    Manual therapy: to reduce pain and tissue tension, improve range of motion, neuromodulation, in order to promote improved ability to complete functional activities. PRONE - CPA grade III-IV with KE wedge from T3-L5, 1x~20  reps/segment for improve joint motion and joint nutrition improved ranges of mobility.    Therapeutic exercise: to centralize symptoms and improve ROM, strength, muscular endurance, and activity tolerance required  for successful completion of functional activities.  - wall sags, 1x20 - standing lumbar extension against  table, 1x20  (Manual therapy - see above)  - prone press up 2x10 - seated hip flexor stretch, 3x30 seconds each side (needed cuing to improve technique).  - standing alternating TRX lunge to warrior 1 position, 3x5 each side with metronome to improve 5 second hold.  - inclined "Y" prone on TOTAL GYM, 3x10 with 2# DB. (Patient with poor form mostly feeling it in arms by end of 3rd set - discussed longer rests to improve use of lower trap).  - bird dog, 3x10 each side, 1-3 second hold with metronome (pt cued for 5 seconds).  - glute thrusts sitting on airex on floor with back on plinth with pillow under upper back to improve comfort. 3x10.    Pt required multimodal cuing for proper technique and to facilitate improved neuromuscular control, strength, range of motion, and functional ability resulting in improved performance and form.   HOME EXERCISE PROGRAM Access Code: [email or web address] URL: [email or web address] Date: 03/22/2021 Prepared by: Rosita Kea   Exercises Sidelying Thoracic Rotation with Open Book - 1-2 x daily - 2 sets - 10 reps - 1 breath hold Quadruped Thoracic Rotation Full Range with Hand on Neck - 1 x daily - 2 sets - 10 reps Bird Dog - 1 x daily - 3 sets - 10 reps - 1-5 seconds hold Doorway Pec Stretch at 90 Degrees Abduction - 1 x daily - 3 reps - 30 hold Standing Lumbar Extension at [place] 4 x daily - 2 sets - 10 reps Standing Lumbar Extension with Counter - 4 x daily - 2 sets - 10 reps Supine Hip Extension on Bench - 1 x daily - 3 sets - 10 reps - 5 seconds hold   [email or web address] [id number] [[id number]]   Seated Hip Flexor Stretch  -  Repeat 3 Times, Hold 30 Seconds, Perform 1 Times a Day    PT Education - 04/26/21 1525     Education Details form/technique with exercises    Person(s) Educated Patient    Methods Explanation;Demonstration;Tactile  cues;Verbal cues    Comprehension Returned demonstration;Verbal cues required;Tactile cues required;Need further instruction;Verbalized understanding              PT Short Term Goals - 01/19/21 1126       PT SHORT TERM GOAL #1   Title Be independent with initial home exercise program for self-management of symptoms.    Baseline initial HEP provided at IE (12/29/2020);    Time 2    Period Weeks    Status Achieved    Target Date 01/13/21               PT Long Term Goals - 03/24/21 1558       PT LONG TERM GOAL #1   Title Be independent with a long-term home exercise program for self-management of symptoms.    Baseline  Initial HEP provided at IE (12/29/2020); participating in appropriate HEP (02/22/2021); continues to participate in appropriate HEP with some exceptions when busy (03/24/2021);    Time 12    Period Weeks    Status Partially Met   TARGET DATE FOR ALL LONG TERM GOALS: 03/23/2021. Updated to 06/16/2021 for unmet goals.     PT LONG TERM GOAL #2   Title Demonstrate improved FOTO to equal or greater than 75 by visit #10 to demonstrate improvement in overall condition and self-reported functional ability.    Baseline 63 (12/29/2020); 56 (02/22/2021); 65 at visit #14 (03/24/2021)    Time 12    Period Weeks    Status Partially Met      PT LONG TERM GOAL #3   Title Patient will improve lumbar extension to equal or greater than 50% to improve upright posture for improved standing tolerance for cooking.    Baseline 25% (12/29/2020); 25% less pain (02/22/2021); Extension: 35% with pain across low back.(03/24/2021);    Time 12    Period Weeks    Status Partially Met      PT LONG TERM GOAL #4   Title Reduce pain with functional activities to equal or less than 1/10 to allow patient to complete usual activities including ADLs, IADLs, cooking, and social engagement with less difficulty.    Baseline 10/10 with prolonged standing (12/29/2020); 8/10 (02/22/2021); 6/10 while traveling  (03/24/2021);    Time 12    Period Weeks    Status Partially Met      PT LONG TERM GOAL #5   Title Complete community, work and/or recreational activities without limitation due to current condition.    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, stairs, bending, lifting, staining deck, shopping, walking, social participation (12/29/2020); stairs improved, can stand a bit longer and walking is better (02/22/2021); reports improved tolerance for functional activities (03/24/2021);    Time 12    Period Weeks    Status Partially Met                    Patient will benefit from skilled therapeutic intervention in order to improve the following deficits and impairments:     Visit Diagnosis: Chronic bilateral low back pain, unspecified whether sciatica present  Pain in thoracic spine  Abnormal posture     Problem List Patient Active Problem List   Diagnosis Date Noted   Zenker's (hypopharyngeal) diverticulum 03/22/2018   Gastroesophageal reflux disease 03/22/2018   Fatty liver 10/09/2017   BPH (benign prostatic hyperplasia) 11/21/2014   Hypothyroidism 11/21/2014   Obesity 11/21/2014   Asthma 11/21/2014   Inguinal hernia 11/21/2014   OA (osteoarthritis) 11/21/2014   H/O adenomatous polyp of colon 10/14/2014    Everlean Alstrom. Graylon Good, PT, DPT 04/26/21, 3:54 PM  [facility] [address], [place], [address] Phone: [phone]   Fax:  [phone]  Name: Daniel [name] MRN: [id number] Date of Birth: 06-07-1950

## 2021-04-28 ENCOUNTER — Ambulatory Visit: Payer: Medicare Other | Attending: Family Medicine | Admitting: Physical Therapy

## 2021-04-28 ENCOUNTER — Encounter: Payer: Self-pay | Admitting: Physical Therapy

## 2021-04-28 DIAGNOSIS — M545 Low back pain, unspecified: Secondary | ICD-10-CM | POA: Insufficient documentation

## 2021-04-28 DIAGNOSIS — G8929 Other chronic pain: Secondary | ICD-10-CM | POA: Insufficient documentation

## 2021-04-28 DIAGNOSIS — M546 Pain in thoracic spine: Secondary | ICD-10-CM | POA: Insufficient documentation

## 2021-04-28 DIAGNOSIS — R293 Abnormal posture: Secondary | ICD-10-CM | POA: Insufficient documentation

## 2021-04-28 NOTE — Therapy (Signed)
[facility] [address] [place], [address] Phone: [phone]   Fax:  [phone]  Physical Therapy Treatment  Patient Details  Name: Daniel [name] MRN: [id number] Date of Birth: November 03, 1950 Referring Provider (PT): Jerrol Banana., MD   Encounter Date: 04/28/2021   PT End of Session - 04/28/21 1714     Visit Number 17    Number of Visits 75    Date for PT Re-Evaluation 06/16/21    Authorization Type [employer] reporting period from 02/22/2021    Progress Note Due on Visit 20    PT Start Time 1515    PT Stop Time 1600    PT Time Calculation (min) 45 min    Activity Tolerance Patient tolerated treatment well    Behavior During Therapy [facility] for tasks assessed/performed             Past Medical History:  Diagnosis Date   Actinic keratosis    Asthma    Eczema    Hx of dysplastic nevus 06/06/2006   Mid lower back, slight atypia   Hypothyroidism    Seasonal allergies    Thyroid disease     Past Surgical History:  Procedure Laterality Date   CATARACT EXTRACTION     COLON SURGERY     COLONOSCOPY WITH PROPOFOL N/A 11/18/2014   Procedure: COLONOSCOPY WITH PROPOFOL;  Surgeon: [name], MD;  Location: [facility];  Service: Endoscopy;  Laterality: N/A;   EYE SURGERY     KNEE SURGERY     NASAL SEPTUM SURGERY      There were no vitals filed for this visit.   Subjective Assessment - 04/28/21 1522     Subjective Pateint reports no pain upon arrival. He was a bit sore after last PT session but not bad.    Pertinent History Patient is a 70 y.o. male who presents to outpatient physical therapy with a referral for medical diagnosis acute bilateral low back pain without sciatica. This patient's chief complaints consist of low back pain with radiation into R > L posterior glute and up to mid thoracic spine, leading to the following functional deficits: cooking for church, prolonged sitting,  prolonged standing, yard work, Architectural technologist, sit <> stand, stairs, bending, lifting, staining deck, shopping, walking, social participation.    Relevant past medical history and comorbidities include asthma, Zenker's (hypopharyngeal) diverticulum, GERD, fatty liver, hypothyroidism, BPH, OA, hx adeomatous polyp of colon, obesity, inguinal hernia, hx of dysplastic nevus, colon surgery (7 inches removed of colon), cataract extraction.  Patient denies hx of cancer, stroke, seizures, lung problem, major cardiac events, diabetes, unexplained weight loss, changes in bowel or bladder problems, new onset stumbling or dropping things, spinal surgery.    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   How long can you stand comfortably? 1-1.5 hours    Diagnostic tests Lumbar radiograph report 11/30/2020: "IMPRESSION:  1. Moderate multilevel lumbar spondylosis, progressed from prior.  2. Moderate-large volume of stool throughout the visualized colon."    Patient Stated Goals "to be able to stay on my feet as long as I need to without my back killing me"    Currently in Pain? No/denies    Pain Onset More than a month ago  TREATMENT:    Manual therapy: to reduce pain and tissue tension, improve range of motion, neuromodulation, in order to promote improved ability to complete functional activities. HOOKLYING  - PROM hip flexor stretch in Gaenslen's position, 3x45 seconds each side.  PRONE - CPA grade III-IV with KE wedge from T3-L5, 1x20  reps/segment for improve joint motion and joint nutrition improved ranges of mobility.    Therapeutic exercise: to centralize symptoms and improve ROM, strength, muscular endurance, and activity tolerance required for successful  completion of functional activities.  - Treadmill 1.3-1.3 mph with 0%   grade with CGA assistance. For improved lower extremity mobility, muscular endurance, and weightbearing activity tolerance; and to induce the analgesic effect of aerobic exercise, stimulate improved joint nutrition, and prepare body structures and systems for following interventions. x 5  minutes. Patient with very poor form and keeps feet behind hips, lacks heel strike, reports it feels really unnatural and uncomfortable to bring feet forward. States he usually walks 45mh when he walked every morning.  - standing lumbar extension treadmill bar 2x20  - standing lower trap "Y" 3x10 with yellow theraband  - standing alternating TRX lunge to warrior 1 position with arm drop, 3x10 each side with heavy tactile cues.  (Manual therapy - see above)  - prone press up 1x10 - glute thrusts sitting on airex on floor with back on plinth with pillow under upper back to improve comfort. 3x10. PT counted out loud 2 seconds with improved hold time.    Pt required multimodal cuing for proper technique and to facilitate improved neuromuscular control, strength, range of motion, and functional ability resulting in improved performance and form.   HOME EXERCISE PROGRAM Access Code: [email or web address] URL: [email or web address] Date: 03/22/2021 Prepared by: SRosita Kea  Exercises Sidelying Thoracic Rotation with Open Book - 1-2 x daily - 2 sets - 10 reps - 1 breath hold Quadruped Thoracic Rotation Full Range with Hand on Neck - 1 x daily - 2 sets - 10 reps Bird Dog - 1 x daily - 3 sets - 10 reps - 1-5 seconds hold Doorway Pec Stretch at 90 Degrees Abduction - 1 x daily - 3 reps - 30 hold Standing Lumbar Extension at WDune Acres4 x daily - 2 sets - 10 reps Standing Lumbar Extension with Counter - 4 x daily - 2 sets - 10 reps Supine Hip Extension on Bench - 1 x daily - 3 sets - 10 reps - 5 seconds hold   [email or web address] [id number] [[id number]]   Seated Hip Flexor Stretch  -  Repeat 3 Times, Hold 30 Seconds, Perform 1 Times a Day    PT Education - 04/28/21 1713     Education Details form/technique with exercises    Person(s) Educated Patient    Methods Explanation;Demonstration;Verbal cues;Tactile cues    Comprehension Verbalized understanding;Returned demonstration;Verbal cues required;Tactile cues required;Need further instruction              PT Short Term Goals - 01/19/21 1126       PT SHORT TERM GOAL #1   Title Be independent with initial home exercise program for self-management of symptoms.    Baseline initial HEP provided at [facility] (12/29/2020);    Time 2    Period Weeks    Status Achieved    Target Date 01/13/21               PT Long Term Goals - 03/24/21 1558       PT  LONG TERM GOAL #1   Title Be independent with a long-term home exercise program for self-management of symptoms.    Baseline Initial HEP provided at [facility] (12/29/2020); participating in appropriate HEP (02/22/2021); continues to participate in appropriate HEP with some exceptions when busy (03/24/2021);    Time 12    Period Weeks    Status Partially Met   TARGET DATE FOR ALL LONG TERM GOALS: 03/23/2021. Updated to 06/16/2021 for unmet goals.     PT LONG TERM GOAL #2   Title Demonstrate improved FOTO to equal or greater than 75 by visit #10 to demonstrate improvement in overall condition and self-reported functional ability.    Baseline 63 (12/29/2020); 56 (02/22/2021); 65 at visit #14 (03/24/2021)    Time 12    Period Weeks    Status Partially Met      PT LONG TERM GOAL #3   Title Patient will improve lumbar extension to equal or greater than 50% to improve upright posture for improved standing tolerance for cooking.    Baseline 25% (12/29/2020); 25% less pain (02/22/2021); Extension: 35% with pain across low back.(03/24/2021);    Time 12    Period Weeks    Status Partially Met      PT LONG TERM GOAL #4   Title Reduce pain with  functional activities to equal or less than 1/10 to allow patient to complete usual activities including ADLs, IADLs, cooking, and social engagement with less difficulty.    Baseline 10/10 with prolonged standing (12/29/2020); 8/10 (02/22/2021); 6/10 while traveling (03/24/2021);    Time 12    Period Weeks    Status Partially Met      PT LONG TERM GOAL #5   Title Complete community, work and/or recreational activities without limitation due to current condition.    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, stairs, bending, lifting, staining deck, shopping, walking, social participation (12/29/2020); stairs improved, can stand a bit longer and walking is better (02/22/2021); reports improved tolerance for functional activities (03/24/2021);    Time 12    Period Weeks    Status Partially Met                   Plan - 04/28/21 1850     Clinical Impression Statement Patient tolerated treatment well and said he felt like he could stand up a lot straighter than he had in a while by end of session. Patient was visibly in more erect posture by end of session. Session focused on improving lumbar, thoracic, and hip extension, posture awareness/strength/endurance. Patient requires significant cuing for proper form and hold times during exercise. He was unable to walk on treadmill with natural gait and took small steps with feet behind body, stating he felt uncomfortable and unnatural when moving his feet further forwards. He states he leans forward with poor posture because he feels weakn in his back and legs when he spends too much time upright, which suggests symptomatic spinal stenosis. Patient would benefit from continued management of limiting condition by skilled physical therapist to address remaining impairments and functional limitations to work towards stated goals and  return to PLOF or maximal functional independence.    Personal Factors and Comorbidities Time since onset of injury/illness/exacerbation;Past/Current Experience;Comorbidity 3+    Comorbidities Relevant past medical history and comorbidities include asthma, Zenker's (hypopharyngeal) diverticulum, GERD, fatty liver, hypothyroidism, BPH, OA, hx adeomatous polyp of colon, obesity,  inguinal hernia, hx of dysplastic nevus, colon surgery (7 inches removed of colon), cataract extraction.    Examination-Activity Limitations Carry;Bend;Caring for Others;Lift;Stairs;Squat;Stand;Locomotion Level;Sit;Dressing;Transfers    Examination-Participation Restrictions Church;Community Activity;Meal Prep;Interpersonal Relationship;Yard Work    Stability/Clinical Decision Making Stable/Uncomplicated    Rehab Potential Good    PT Frequency 2x / week    PT Duration 12 weeks    PT Treatment/Interventions ADLs/Self Care Home Management;Aquatic Therapy;Cryotherapy;Moist Heat;Electrical Stimulation;Gait training;Stair training;Functional mobility training;Neuromuscular re-education;Therapeutic exercise;Therapeutic activities;Patient/family education;Manual techniques;Dry needling;Passive range of motion;Joint Manipulations;Spinal Manipulations    PT Next Visit Plan spinal mobility, postural and glute strengthening, functional strengthening, hip extension ROM    PT Home Exercise Plan Medbridge Access Code: [email or web address]    Consulted and Agree with Plan of Care Patient             Patient will benefit from skilled therapeutic intervention in order to improve the following deficits and impairments:  Improper body mechanics, Pain, Decreased mobility, Increased muscle spasms, Postural dysfunction, Decreased activity tolerance, Decreased endurance, Decreased range of motion, Decreased strength, Hypomobility, Impaired perceived functional ability, Difficulty walking  Visit Diagnosis: Chronic bilateral low back pain, unspecified  whether sciatica present  Pain in thoracic spine  Abnormal posture     Problem List Patient Active Problem List   Diagnosis Date Noted   Zenker's (hypopharyngeal) diverticulum 03/22/2018   Gastroesophageal reflux disease 03/22/2018   Fatty liver 10/09/2017   BPH (benign prostatic hyperplasia) 11/21/2014   Hypothyroidism 11/21/2014   Obesity 11/21/2014   Asthma 11/21/2014   Inguinal hernia 11/21/2014   OA (osteoarthritis) 11/21/2014   H/O adenomatous polyp of colon 10/14/2014    Everlean Alstrom. Graylon Good, PT, DPT 04/28/21, 6:52 PM   [facility] [address], [place], [address] Phone: [phone]   Fax:  [phone]  Name: Daniel [name] MRN: [id number] Date of Birth: Oct 04, 1950

## 2021-05-03 ENCOUNTER — Ambulatory Visit: Payer: Medicare Other | Admitting: Physical Therapy

## 2021-05-03 ENCOUNTER — Encounter: Payer: Self-pay | Admitting: Physical Therapy

## 2021-05-03 DIAGNOSIS — M546 Pain in thoracic spine: Secondary | ICD-10-CM

## 2021-05-03 DIAGNOSIS — R293 Abnormal posture: Secondary | ICD-10-CM | POA: Diagnosis not present

## 2021-05-03 DIAGNOSIS — G8929 Other chronic pain: Secondary | ICD-10-CM | POA: Diagnosis not present

## 2021-05-03 DIAGNOSIS — M545 Low back pain, unspecified: Secondary | ICD-10-CM

## 2021-05-03 NOTE — Therapy (Signed)
[facility] [address] [place], [address] Phone: [phone]   Fax:  [phone]  Physical Therapy Treatment  Patient Details  Name: WERNER LABELLA MRN: [id number] Date of Birth: 1950/10/28 Referring Provider (PT): Jerrol Banana., MD   Encounter Date: 05/03/2021   PT End of Session - 05/03/21 0927     Visit Number 18    Number of Visits 38    Date for PT Re-Evaluation 06/16/21    Authorization Type [employer] reporting period from 02/22/2021    Progress Note Due on Visit 20    PT Start Time 0900    PT Stop Time 0940    PT Time Calculation (min) 40 min    Equipment Utilized During Treatment Gait belt    Activity Tolerance Patient tolerated treatment well    Behavior During Therapy [facility] for tasks assessed/performed             Past Medical History:  Diagnosis Date   Actinic keratosis    Asthma    Eczema    Hx of dysplastic nevus 06/06/2006   Mid lower back, slight atypia   Hypothyroidism    Seasonal allergies    Thyroid disease     Past Surgical History:  Procedure Laterality Date   CATARACT EXTRACTION     COLON SURGERY     COLONOSCOPY WITH PROPOFOL N/A 11/18/2014   Procedure: COLONOSCOPY WITH PROPOFOL;  Surgeon: [name], MD;  Location: [facility];  Service: Endoscopy;  Laterality: N/A;   EYE SURGERY     KNEE SURGERY     NASAL SEPTUM SURGERY      There were no vitals filed for this visit.   Subjective Assessment - 05/03/21 0859     Subjective Patient reports no pain upon arrival. States he had a really good day yesterday but he did a lot of overhead work that made to tops of his shoulders sore. States he felt like he was a lot more upright yesterday.  States he tried to use the treadmill, ski machine, and bike at the gym before and it "just didn't work." States the bike was the easiest.    Pertinent History Patient is a 70 y.o. male who presents to outpatient physical  therapy with a referral for medical diagnosis acute bilateral low back pain without sciatica. This patient's chief complaints consist of low back pain with radiation into R > L posterior glute and up to mid thoracic spine, leading to the following functional deficits: cooking for church, prolonged sitting, prolonged standing, yard work, Architectural technologist, sit <> stand, stairs, bending, lifting, staining deck, shopping, walking, social participation.    Relevant past medical history and comorbidities include asthma, Zenker's (hypopharyngeal) diverticulum, GERD, fatty liver, hypothyroidism, BPH, OA, hx adeomatous polyp of colon, obesity, inguinal hernia, hx of dysplastic nevus, colon surgery (7 inches removed of colon), cataract extraction.  Patient denies hx of cancer, stroke, seizures, lung problem, major cardiac events, diabetes, unexplained weight loss, changes in bowel or bladder problems, new onset stumbling or dropping things, spinal surgery.    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   How long can you stand comfortably? 1-1.5 hours    Diagnostic tests Lumbar radiograph report 11/30/2020: "IMPRESSION:  1. Moderate multilevel lumbar spondylosis, progressed from prior.  2. Moderate-large volume  of stool throughout the visualized colon."    Patient Stated Goals "to be able to stay on my feet as long as I need to without my back killing me"    Currently in Pain? No/denies    Pain Onset More than a month ago                TREATMENT:    Manual therapy: to reduce pain and tissue tension, improve range of motion, neuromodulation, in order to promote improved ability to complete functional activities. HOOKLYING  - PROM hip flexor stretch in Gaenslen's position, 3x45  seconds each side.  PRONE - CPA grade III-IV with KE wedge from T3-L5, 1x20  reps/segment for improve joint motion and joint nutrition improved ranges of mobility.    Therapeutic exercise: to centralize symptoms and improve ROM, strength, muscular endurance, and activity tolerance required for successful completion of functional activities.  - Treadmill 1.0 mph with 0%   grade with CGA assistance. For improved lower extremity mobility, muscular endurance, and weightbearing activity tolerance; and to induce the analgesic effect of aerobic exercise, stimulate improved joint nutrition, and prepare body structures and systems for following interventions. x 5  minutes. Patient with very poor form and keeps feet behind hips, lacks heel strike, reports it feels really unnatural and uncomfortable to bring feet forward like he is having to push the treadmill to make it go faster. Showed him a video of him walking on treadmill vs over ground. Noted for much more natural gait overground (with mildly stooped posture).  - standing lumbar extension treadmill bar 2x20  - standing lower trap "Y" 3x10 with yellow theraband  (Manual therapy - see above)  - prone press up 2x10   Pt required multimodal cuing for proper technique and to facilitate improved neuromuscular control, strength, range of motion, and functional ability resulting in improved performance and form.   HOME EXERCISE PROGRAM Access Code: [email or web address] URL: [email or web address] Date: 03/22/2021 Prepared by: Rosita Kea   Exercises Sidelying Thoracic Rotation with Open Book - 1-2 x daily - 2 sets - 10 reps - 1 breath hold Quadruped Thoracic Rotation Full Range with Hand on Neck - 1 x daily - 2 sets - 10 reps Bird Dog - 1 x daily - 3 sets - 10 reps - 1-5 seconds hold Doorway Pec Stretch at 90 Degrees Abduction - 1 x daily - 3 reps - 30 hold Standing Lumbar Extension at [place] 4 x daily - 2 sets - 10 reps Standing Lumbar  Extension with Counter - 4 x daily - 2 sets - 10 reps Supine Hip Extension on Bench - 1 x daily - 3 sets - 10 reps - 5 seconds hold   [email or web address] [id number] [[id number]]   Seated Hip Flexor Stretch  -  Repeat 3 Times, Hold 30 Seconds, Perform 1 Times a Day      PT Education - 05/03/21 0928     Education Details form/technique with exercises    Person(s) Educated Patient    Methods Explanation;Demonstration;Tactile cues;Verbal cues    Comprehension Verbalized understanding;Returned demonstration;Verbal cues required;Tactile cues required;Need further instruction              PT Short Term Goals - 01/19/21 1126       PT SHORT TERM GOAL #1   Title Be independent with initial home exercise program for self-management of symptoms.    Baseline initial HEP provided at [facility] (12/29/2020);    Time 2    Period Weeks  Status Achieved    Target Date 01/13/21               PT Long Term Goals - 03/24/21 1558       PT LONG TERM GOAL #1   Title Be independent with a long-term home exercise program for self-management of symptoms.    Baseline Initial HEP provided at [facility] (12/29/2020); participating in appropriate HEP (02/22/2021); continues to participate in appropriate HEP with some exceptions when busy (03/24/2021);    Time 12    Period Weeks    Status Partially Met   TARGET DATE FOR ALL LONG TERM GOALS: 03/23/2021. Updated to 06/16/2021 for unmet goals.     PT LONG TERM GOAL #2   Title Demonstrate improved FOTO to equal or greater than 75 by visit #10 to demonstrate improvement in overall condition and self-reported functional ability.    Baseline 63 (12/29/2020); 56 (02/22/2021); 65 at visit #14 (03/24/2021)    Time 12    Period Weeks    Status Partially Met      PT LONG TERM GOAL #3   Title Patient will improve lumbar extension to equal or greater than 50% to improve upright posture for improved standing tolerance for cooking.    Baseline 25% (12/29/2020); 25% less pain (02/22/2021);  Extension: 35% with pain across low back.(03/24/2021);    Time 12    Period Weeks    Status Partially Met      PT LONG TERM GOAL #4   Title Reduce pain with functional activities to equal or less than 1/10 to allow patient to complete usual activities including ADLs, IADLs, cooking, and social engagement with less difficulty.    Baseline 10/10 with prolonged standing (12/29/2020); 8/10 (02/22/2021); 6/10 while traveling (03/24/2021);    Time 12    Period Weeks    Status Partially Met      PT LONG TERM GOAL #5   Title Complete community, work and/or recreational activities without limitation due to current condition.    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, stairs, bending, lifting, staining deck, shopping, walking, social participation (12/29/2020); stairs improved, can stand a bit longer and walking is better (02/22/2021); reports improved tolerance for functional activities (03/24/2021);    Time 12    Period Weeks    Status Partially Met                   Plan - 05/03/21 0917     Clinical Impression Statement Patient tolerated treatment well overall but continues to have severe gait disturbance with treadmill. He was unable to improve gait more than a few steps despite heavy visual and verbal cuing. Session continued to focus on improving postural mobility and strength. Patient would benefit from continued management of limiting condition by skilled physical therapist to address remaining impairments and functional limitations to work towards stated goals and return to PLOF or maximal functional independence.    Personal Factors and Comorbidities Time since onset of injury/illness/exacerbation;Past/Current Experience;Comorbidity 3+    Comorbidities Relevant past medical history and comorbidities include asthma, Zenker's (hypopharyngeal) diverticulum, GERD,  fatty liver, hypothyroidism, BPH, OA, hx adeomatous polyp of colon, obesity, inguinal hernia, hx of dysplastic nevus, colon surgery (7 inches removed of colon), cataract extraction.    Examination-Activity Limitations Carry;Bend;Caring for Others;Lift;Stairs;Squat;Stand;Locomotion Level;Sit;Dressing;Transfers    Examination-Participation Restrictions Church;Community Activity;Meal Prep;Interpersonal Relationship;Yard Work    Stability/Clinical Decision Making Stable/Uncomplicated  Rehab Potential Good    PT Frequency 2x / week    PT Duration 12 weeks    PT Treatment/Interventions ADLs/Self Care Home Management;Aquatic Therapy;Cryotherapy;Moist Heat;Electrical Stimulation;Gait training;Stair training;Functional mobility training;Neuromuscular re-education;Therapeutic exercise;Therapeutic activities;Patient/family education;Manual techniques;Dry needling;Passive range of motion;Joint Manipulations;Spinal Manipulations    PT Next Visit Plan spinal mobility, postural and glute strengthening, functional strengthening, hip extension ROM    PT Home Exercise Plan Medbridge Access Code: [email or web address]    Consulted and Agree with Plan of Care Patient             Patient will benefit from skilled therapeutic intervention in order to improve the following deficits and impairments:  Improper body mechanics, Pain, Decreased mobility, Increased muscle spasms, Postural dysfunction, Decreased activity tolerance, Decreased endurance, Decreased range of motion, Decreased strength, Hypomobility, Impaired perceived functional ability, Difficulty walking  Visit Diagnosis: Chronic bilateral low back pain, unspecified whether sciatica present  Pain in thoracic spine  Abnormal posture     Problem List Patient Active Problem List   Diagnosis Date Noted   Zenker's (hypopharyngeal) diverticulum 03/22/2018   Gastroesophageal reflux disease 03/22/2018   Fatty liver 10/09/2017   BPH (benign prostatic hyperplasia)  11/21/2014   Hypothyroidism 11/21/2014   Obesity 11/21/2014   Asthma 11/21/2014   Inguinal hernia 11/21/2014   OA (osteoarthritis) 11/21/2014   H/O adenomatous polyp of colon 10/14/2014    Everlean Alstrom. Graylon Good, PT, DPT 05/03/21, 9:39 AM   [facility] [address] [place], [address] Phone: [phone]   Fax:  [phone]  Name: HARI CASAUS MRN: [id number] Date of Birth: [birth year]

## 2021-05-05 ENCOUNTER — Ambulatory Visit: Payer: Medicare Other | Admitting: Physical Therapy

## 2021-05-05 ENCOUNTER — Encounter: Payer: Self-pay | Admitting: Physical Therapy

## 2021-05-05 DIAGNOSIS — M546 Pain in thoracic spine: Secondary | ICD-10-CM

## 2021-05-05 DIAGNOSIS — R293 Abnormal posture: Secondary | ICD-10-CM

## 2021-05-05 DIAGNOSIS — M545 Low back pain, unspecified: Secondary | ICD-10-CM | POA: Diagnosis not present

## 2021-05-05 DIAGNOSIS — G8929 Other chronic pain: Secondary | ICD-10-CM | POA: Diagnosis not present

## 2021-05-05 NOTE — Therapy (Signed)
[facility] [address] [place], [address] Phone: [phone]   Fax:  [phone]  Physical Therapy Treatment  Patient Details  Name: Daniel [name] MRN: [id number] Date of Birth: 1951/01/30 Referring Provider (PT): Jerrol Banana., MD   Encounter Date: 05/05/2021   PT End of Session - 05/05/21 0947     Visit Number 19    Number of Visits 80    Date for PT Re-Evaluation 06/16/21    Authorization Type [employer] reporting period from 02/22/2021    Progress Note Due on Visit 20    PT Start Time 0945    PT Stop Time 1025    PT Time Calculation (min) 40 min    Equipment Utilized During Treatment Gait belt    Activity Tolerance Patient tolerated treatment well    Behavior During Therapy [name] for tasks assessed/performed             Past Medical History:  Diagnosis Date   Actinic keratosis    Asthma    Eczema    Hx of dysplastic nevus 06/06/2006   Mid lower back, slight atypia   Hypothyroidism    Seasonal allergies    Thyroid disease     Past Surgical History:  Procedure Laterality Date   CATARACT EXTRACTION     COLON SURGERY     COLONOSCOPY WITH PROPOFOL N/A 11/18/2014   Procedure: COLONOSCOPY WITH PROPOFOL;  Surgeon: [name], MD;  Location: [facility];  Service: Endoscopy;  Laterality: N/A;   EYE SURGERY     KNEE SURGERY     NASAL SEPTUM SURGERY      There were no vitals filed for this visit.   Subjective Assessment - 05/05/21 0945     Subjective Patient reports he did not sleep well last night. He felt "weak" after not sleeping well due to his R knee hurting. He states he felt good yesterday. He was sore at the front of both thighs after last PT session and attributes this to the stretching. States it lasted almost all day. He has felt more upright since last PT session.    Pertinent History Patient is a 70 y.o. male who presents to outpatient physical therapy with a  referral for medical diagnosis acute bilateral low back pain without sciatica. This patient's chief complaints consist of low back pain with radiation into R > L posterior glute and up to mid thoracic spine, leading to the following functional deficits: cooking for church, prolonged sitting, prolonged standing, yard work, Architectural technologist, sit <> stand, stairs, bending, lifting, staining deck, shopping, walking, social participation.    Relevant past medical history and comorbidities include asthma, Zenker's (hypopharyngeal) diverticulum, GERD, fatty liver, hypothyroidism, BPH, OA, hx adeomatous polyp of colon, obesity, inguinal hernia, hx of dysplastic nevus, colon surgery (7 inches removed of colon), cataract extraction.  Patient denies hx of cancer, stroke, seizures, lung problem, major cardiac events, diabetes, unexplained weight loss, changes in bowel or bladder problems, new onset stumbling or dropping things, spinal surgery.    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   How long can you stand comfortably? 1-1.5 hours    Diagnostic tests Lumbar radiograph report 11/30/2020: "IMPRESSION:  1. Moderate multilevel lumbar spondylosis, progressed from prior.  2. Moderate-large volume of stool throughout the visualized colon."  Patient Stated Goals "to be able to stay on my feet as long as I need to without my back killing me"    Currently in Pain? No/denies    Pain Onset More than a month ago              TREATMENT:    Manual therapy: to reduce pain and tissue tension, improve range of motion, neuromodulation, in order to promote improved ability to complete functional activities. HOOKLYING  - PROM hip flexor stretch in Gaenslen's position, 3x45 seconds each side.   PRONE - CPA grade III-IV with KE wedge from T3-L5, 1x20  reps/segment for improve joint motion and joint nutrition improved ranges of mobility.    Therapeutic exercise: to centralize symptoms and improve ROM, strength, muscular endurance, and activity tolerance required for successful completion of functional activities.  - NuStep level 3 using bilateral upper and lower extremities. Seat/handle setting 12. For improved extremity mobility, muscular endurance, and activity tolerance; and to induce the analgesic effect of aerobic exercise, stimulate improved joint nutrition, and prepare body structures and systems for following interventions. x 5:40 minutes. Average SPM = 75.  (Manual therapy - see above) - prone press up 2x10 - glute thrusts sitting on airex on floor with back on plinth with pillow under upper back to improve comfort. 3x10. PT counted out loud 2 seconds with improved hold time.  - standing lumbar extension treadmill bar 2x20  - standing alternating TRX lunge to warrior 1 position with arm drop, 3x10 each side with heavy tactile cues.  - standing lower trap "Y" 3x10 with yellow theraband    Pt required multimodal cuing for proper technique and to facilitate improved neuromuscular control, strength, range of motion, and functional ability resulting in improved performance and form.   HOME EXERCISE PROGRAM Access Code: [email or web address] URL: [email or web address] Date: 03/22/2021 Prepared by: Rosita Kea   Exercises Sidelying Thoracic Rotation with Open Book - 1-2 x daily - 2 sets - 10 reps - 1 breath hold Quadruped Thoracic Rotation Full Range with Hand on Neck - 1 x daily - 2 sets - 10 reps Bird [place] - 1 x daily - 3 sets - 10 reps - 1-5 seconds hold Doorway Pec Stretch at 90 Degrees Abduction - 1 x daily - 3 reps - 30 hold Standing Lumbar Extension at [place] 4 x daily - 2 sets - 10 reps Standing Lumbar Extension with Counter - 4 x daily - 2 sets - 10  reps Supine Hip Extension on Bench - 1 x daily - 3 sets - 10 reps - 5 seconds hold   [email or web address] [id number] [[id number]]   Seated Hip Flexor Stretch  -  Repeat 3 Times, Hold 30 Seconds, Perform 1 Times a Day    PT Education - 05/05/21 0947     Education Details form/technique with exercises    Person(s) Educated Patient    Methods Explanation;Demonstration;Tactile cues;Verbal cues    Comprehension Verbalized understanding;Returned demonstration;Verbal cues required;Tactile cues required;Need further instruction              PT Short Term Goals - 01/19/21 1126       PT SHORT TERM GOAL #1   Title Be independent with initial home exercise program for self-management of symptoms.    Baseline initial HEP provided at [facility] (12/29/2020);    Time 2    Period Weeks    Status Achieved    Target Date 01/13/21  PT Long Term Goals - 03/24/21 1558       PT LONG TERM GOAL #1   Title Be independent with a long-term home exercise program for self-management of symptoms.    Baseline Initial HEP provided at [facility] (12/29/2020); participating in appropriate HEP (02/22/2021); continues to participate in appropriate HEP with some exceptions when busy (03/24/2021);    Time 12    Period Weeks    Status Partially Met   TARGET DATE FOR ALL LONG TERM GOALS: 03/23/2021. Updated to 06/16/2021 for unmet goals.     PT LONG TERM GOAL #2   Title Demonstrate improved FOTO to equal or greater than 75 by visit #10 to demonstrate improvement in overall condition and self-reported functional ability.    Baseline 63 (12/29/2020); 56 (02/22/2021); 65 at visit #14 (03/24/2021)    Time 12    Period Weeks    Status Partially Met      PT LONG TERM GOAL #3   Title Patient will improve lumbar extension to equal or greater than 50% to improve upright posture for improved standing tolerance for cooking.    Baseline 25% (12/29/2020); 25% less pain (02/22/2021); Extension: 35% with pain across low back.(03/24/2021);     Time 12    Period Weeks    Status Partially Met      PT LONG TERM GOAL #4   Title Reduce pain with functional activities to equal or less than 1/10 to allow patient to complete usual activities including ADLs, IADLs, cooking, and social engagement with less difficulty.    Baseline 10/10 with prolonged standing (12/29/2020); 8/10 (02/22/2021); 6/10 while traveling (03/24/2021);    Time 12    Period Weeks    Status Partially Met      PT LONG TERM GOAL #5   Title Complete community, work and/or recreational activities without limitation due to current condition.    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, stairs, bending, lifting, staining deck, shopping, walking, social participation (12/29/2020); stairs improved, can stand a bit longer and walking is better (02/22/2021); reports improved tolerance for functional activities (03/24/2021);    Time 12    Period Weeks    Status Partially Met                   Plan - 05/05/21 1017     Clinical Impression Statement Patient tolerated treatment well overall but did seem more fatigued than at last treatment session. Continued to work on improving postural ROM, strength, and endurance. Patient continues to have difficulty maintaining upright posture and reports pain over postural muscles with prolonged standing. Patient would benefit from continued management of limiting condition by skilled physical therapist to address remaining impairments and functional limitations to work towards stated goals and return to PLOF or maximal functional independence.    Personal Factors and Comorbidities Time since onset of injury/illness/exacerbation;Past/Current Experience;Comorbidity 3+    Comorbidities Relevant past medical history and comorbidities include asthma, Zenker's (hypopharyngeal) diverticulum, GERD, fatty liver,  hypothyroidism, BPH, OA, hx adeomatous polyp of colon, obesity, inguinal hernia, hx of dysplastic nevus, colon surgery (7 inches removed of colon), cataract extraction.    Examination-Activity Limitations Carry;Bend;Caring for Others;Lift;Stairs;Squat;Stand;Locomotion Level;Sit;Dressing;Transfers    Examination-Participation Restrictions Church;Community Activity;Meal Prep;Interpersonal Relationship;Yard Work    Stability/Clinical Decision Making Stable/Uncomplicated    Rehab Potential Good    PT Frequency 2x / week    PT Duration 12 weeks  PT Treatment/Interventions ADLs/Self Care Home Management;Aquatic Therapy;Cryotherapy;Moist Heat;Electrical Stimulation;Gait training;Stair training;Functional mobility training;Neuromuscular re-education;Therapeutic exercise;Therapeutic activities;Patient/family education;Manual techniques;Dry needling;Passive range of motion;Joint Manipulations;Spinal Manipulations    PT Next Visit Plan spinal mobility, postural and glute strengthening, functional strengthening, hip extension ROM    PT Home Exercise Plan Medbridge Access Code: [email or web address]    Consulted and Agree with Plan of Care Patient             Patient will benefit from skilled therapeutic intervention in order to improve the following deficits and impairments:  Improper body mechanics, Pain, Decreased mobility, Increased muscle spasms, Postural dysfunction, Decreased activity tolerance, Decreased endurance, Decreased range of motion, Decreased strength, Hypomobility, Impaired perceived functional ability, Difficulty walking  Visit Diagnosis: Chronic bilateral low back pain, unspecified whether sciatica present  Pain in thoracic spine  Abnormal posture     Problem List Patient Active Problem List   Diagnosis Date Noted   Zenker's (hypopharyngeal) diverticulum 03/22/2018   Gastroesophageal reflux disease 03/22/2018   Fatty liver 10/09/2017   BPH (benign prostatic hyperplasia) 11/21/2014    Hypothyroidism 11/21/2014   Obesity 11/21/2014   Asthma 11/21/2014   Inguinal hernia 11/21/2014   OA (osteoarthritis) 11/21/2014   H/O adenomatous polyp of colon 10/14/2014    Everlean Alstrom. Graylon Good, PT, DPT 05/05/21, 10:27 AM   [facility] [address] [place], [address] Phone: [phone]   Fax:  [phone]  Name: AH BOTT MRN: [id number] Date of Birth: July 30, 1950

## 2021-05-10 ENCOUNTER — Encounter: Payer: Self-pay | Admitting: Physical Therapy

## 2021-05-10 ENCOUNTER — Ambulatory Visit: Payer: Medicare Other | Admitting: Physical Therapy

## 2021-05-10 DIAGNOSIS — M546 Pain in thoracic spine: Secondary | ICD-10-CM | POA: Diagnosis not present

## 2021-05-10 DIAGNOSIS — R293 Abnormal posture: Secondary | ICD-10-CM | POA: Diagnosis not present

## 2021-05-10 DIAGNOSIS — M545 Low back pain, unspecified: Secondary | ICD-10-CM | POA: Diagnosis not present

## 2021-05-10 DIAGNOSIS — G8929 Other chronic pain: Secondary | ICD-10-CM

## 2021-05-10 NOTE — Therapy (Signed)
[facility] [address], [place], [address] Phone: [phone]   Fax:  [phone]  Physical Therapy Treatment / Progress Note Dates of reporting from 02/22/2021 to 05/10/2021  Patient Details  Name: Daniel [name] MRN: [id number] Date of Birth: [birth year] Referring Provider (PT): Jerrol Banana., MD   Encounter Date: 05/10/2021   PT End of Session - 05/10/21 0915     Visit Number 20    Number of Visits 38    Date for PT Re-Evaluation 06/16/21    Authorization Type [employer] reporting period from 02/22/2021    Progress Note Due on Visit 20    PT Start Time 0902    PT Stop Time 0942    PT Time Calculation (min) 40 min    Equipment Utilized During Treatment Gait belt    Activity Tolerance Patient tolerated treatment well    Behavior During Therapy [facility] for tasks assessed/performed             Past Medical History:  Diagnosis Date   Actinic keratosis    Asthma    Eczema    Hx of dysplastic nevus 06/06/2006   Mid lower back, slight atypia   Hypothyroidism    Seasonal allergies    Thyroid disease     Past Surgical History:  Procedure Laterality Date   CATARACT EXTRACTION     COLON SURGERY     COLONOSCOPY WITH PROPOFOL N/A 11/18/2014   Procedure: COLONOSCOPY WITH PROPOFOL;  Surgeon: [name], MD;  Location: [place];  Service: Endoscopy;  Laterality: N/A;   EYE SURGERY     KNEE SURGERY     NASAL SEPTUM SURGERY      There were no vitals filed for this visit.   Subjective Assessment - 05/10/21 0904     Subjective Patient reports he is doing "okay" today. States he slept wrong (on his stomach) which caused back pain this morning and it now feels stiff. 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 States he has no pain now and yesterday his back did not bother him  at all. He did some work around the house yesterday and ran some errands. He did not stand up for an extended period of time. Rates his highest pain with functional activity (prolonged standing) to 4/10 in the last 2 weeks (consistently). He states his HEP has been going fine. He does them usually 2x a day. Patient reports he thinks he is better with PT and estimates he is 40-50% better than when he started PT. The biggest problem for him is feeling stiff and then starting to hurt the longer he is standing. He is going back to see the doctor next week.    Pertinent History Patient is a 70 y.o. male who presents to outpatient physical therapy with a referral for medical diagnosis acute bilateral low back pain without sciatica. This patient's chief complaints consist of low back pain with radiation into R > L posterior glute and up to mid thoracic spine, leading to the following functional deficits: cooking for church, prolonged sitting, prolonged standing, yard work, Architectural technologist, sit <> stand, stairs, bending, lifting, staining deck, shopping, walking, social participation.    Relevant past medical history and comorbidities include asthma, Zenker's (hypopharyngeal) diverticulum, GERD, fatty liver, hypothyroidism,  BPH, OA, hx adeomatous polyp of colon, obesity, inguinal hernia, hx of dysplastic nevus, colon surgery (7 inches removed of colon), cataract extraction.  Patient denies hx of cancer, stroke, seizures, lung problem, major cardiac events, diabetes, unexplained weight loss, changes in bowel or bladder problems, new onset stumbling or dropping things, spinal surgery.    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   How long can you stand comfortably? 1-1.5 hours    Diagnostic  tests Lumbar radiograph report 11/30/2020: "IMPRESSION:  1. Moderate multilevel lumbar spondylosis, progressed from prior.  2. Moderate-large volume of stool throughout the visualized colon."    Patient Stated Goals "to be able to stay on my feet as long as I need to without my back killing me"    Currently in Pain? No/denies    Pain Onset More than a month ago    Effect of Pain on Daily Activities cooking for church/events, anything standing for over 20 mins. yard work, Architectural technologist, putting out some [employer] - bothered him some, walking with back belt, bending, lifting.   Better: prolonged sitting (1.5 hours typing for meetings or riding in a car - stiff after), sit <> stand (no longer bothers him).            OBJECTIVE  SELF-REPORTED FUNCTION FOTO score: 58/100 (lumbar spine questionnaire)  SPINE MOTION Lumbar AROM *Indicates pain Flexion: = fingers to distal shin (normal range for him, end range pain at low back. Feels more stiff). Extension: = 35%, end range pain across low back, no worse. Feels weak in back and B LE.  Rotation: R = WFL, L = WFL Side Flexion: R = 25% R sided pain, L = 25% no discomfort.   TREATMENT:    Manual therapy: to reduce pain and tissue tension, improve range of motion, neuromodulation, in order to promote improved ability to complete functional activities. HOOKLYING  - PROM hip flexor stretch in Gaenslen's position, 3x45 seconds each side.  PRONE - CPA grade III-IV with KE wedge from T3-L5, 1x20  reps/segment for improve joint motion and joint nutrition improved ranges of mobility.    Therapeutic exercise: to centralize symptoms and improve ROM, strength, muscular endurance, and activity tolerance required for successful completion of functional activities.  - NuStep level 4 using bilateral upper and lower extremities. Seat/handle setting 12. For improved extremity mobility, muscular endurance, and activity tolerance; and to  induce the analgesic effect of aerobic exercise, stimulate improved joint nutrition, and prepare body structures and systems for following interventions. x 5:39 minutes. Average SPM = 69.  - standing lumbar extension treadmill bar 3x10  - Lumbar AROM to assess progress - see above (Manual therapy - see above) - prone press up 2x10   Pt required multimodal cuing for proper technique and to facilitate improved neuromuscular control, strength, range of motion, and functional ability resulting in improved performance and form.   HOME EXERCISE PROGRAM Access Code: [email or web address] URL: [email or web address] Date: 03/22/2021 Prepared by: Rosita Kea   Exercises Sidelying Thoracic Rotation with Open Book - 1-2 x daily - 2 sets - 10 reps - 1 breath hold Quadruped Thoracic Rotation Full Range with Hand on Neck - 1 x daily - 2 sets - 10 reps Bird Dog - 1 x daily - 3 sets - 10 reps - 1-5 seconds hold Doorway Pec Stretch at 49  Degrees Abduction - 1 x daily - 3 reps - 30 hold Standing Lumbar Extension at [place] - 4 x daily - 2 sets - 10 reps Standing Lumbar Extension with Counter - 4 x daily - 2 sets - 10 reps Supine Hip Extension on Bench - 1 x daily - 3 sets - 10 reps - 5 seconds hold   [email or web address] [id number] [[id number]]   Seated Hip Flexor Stretch  -  Repeat 3 Times, Hold 30 Seconds, Perform 1 Times a Day       PT Education - 05/10/21 1724     Education Details form/technique with exercises, progress, POC    Person(s) Educated Patient    Methods Explanation;Demonstration;Verbal cues    Comprehension Verbalized understanding;Returned demonstration;Verbal cues required;Need further instruction              PT Short Term Goals - 01/19/21 1126       PT SHORT TERM GOAL #1   Title Be independent with initial home exercise program for self-management of symptoms.    Baseline initial HEP provided at IE (12/29/2020);    Time 2    Period Weeks    Status Achieved    Target Date  01/13/21               PT Long Term Goals - 05/10/21 1725       PT LONG TERM GOAL #1   Title Be independent with a long-term home exercise program for self-management of symptoms.    Baseline Initial HEP provided at IE (12/29/2020); participating in appropriate HEP (02/22/2021); continues to participate in appropriate HEP with some exceptions when busy (03/24/2021); Patient reports participating well (05/10/2021);    Time 12    Period Weeks    Status Partially Met   TARGET DATE FOR ALL LONG TERM GOALS: 03/23/2021. Updated to 06/16/2021 for unmet goals.     PT LONG TERM GOAL #2   Title Demonstrate improved FOTO to equal or greater than 75 by visit #10 to demonstrate improvement in overall condition and self-reported functional ability.    Baseline 63 (12/29/2020); 56 (02/22/2021); 65 at visit #14 (03/24/2021); 58 at visit 20 (05/10/2021);    Time 12    Period Weeks    Status On-going      PT LONG TERM GOAL #3   Title Patient will improve lumbar extension to equal or greater than 50% to improve upright posture for improved standing tolerance for cooking.    Baseline 25% (12/29/2020); 25% less pain (02/22/2021); Extension: 35% with pain across low back.(03/24/2021; 05/10/2021);    Time 12    Period Weeks    Status Partially Met      PT LONG TERM GOAL #4   Title Reduce pain with functional activities to equal or less than 1/10 to allow patient to complete usual activities including ADLs, IADLs, cooking, and social engagement with less difficulty.    Baseline 10/10 with prolonged standing (12/29/2020); 8/10 (02/22/2021); 6/10 while traveling (03/24/2021); 4/10 with prolonged standing (05/10/2021);    Time 12    Period Weeks    Status Partially Met      PT LONG TERM GOAL #5   Title Complete community, work and/or recreational activities without limitation due to current condition.    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, stairs, bending, lifting, staining deck, shopping, walking, social participation (12/29/2020); stairs improved, can stand a  bit longer and walking is better (02/22/2021); reports improved tolerance for functional activities (03/24/2021); continues to have difficulty with prolonged standing and activities that require standing or upright posture in sitting - reports he feels 40-50% better since starting PT (05/10/2021);    Time 12    Period Weeks    Status Partially Met                   Plan - 05/10/21 1725     Clinical Impression Statement Patient has attended 20 physical therapy sessions this episode of care (starting 12/29/2020). He has had difficulty with attendance due to busy schedule but has been consistently attending the last 2.5 weeks. Patient appears to get temporary relief in visit with mild improvement in stooped posture by end of session but continues to report significant limitations with prolonged standing activities. Patient's FOTO score has not improved overall since starting but he rates his improvement 40-50%. Patient does have a lot of stiffness in his spine and difficulty maintaining good posture. Patient is committed to attending regularly and working at building up his wlaking tolerance to at least 1 mile a day over the next few weeks. Patient's difficulty with freuqent attendance over his course of care may have negatively affected his progress. Plan to continue PT with regular visits and increased focus on walking at home until 06/16/2021 when discharge is planned if no further improvement. Patient is also to return to referring provider next week and plans to inquire about any other treatment recommendations.    Personal Factors and Comorbidities Time since onset of injury/illness/exacerbation;Past/Current Experience;Comorbidity 3+    Comorbidities Relevant past medical history and comorbidities include asthma,  Zenker's (hypopharyngeal) diverticulum, GERD, fatty liver, hypothyroidism, BPH, OA, hx adeomatous polyp of colon, obesity, inguinal hernia, hx of dysplastic nevus, colon surgery (7 inches removed of colon), cataract extraction.    Examination-Activity Limitations Carry;Bend;Caring for Others;Lift;Stairs;Squat;Stand;Locomotion Level;Sit;Dressing;Transfers    Examination-Participation Restrictions Church;Community Activity;Meal Prep;Interpersonal Relationship;Yard Work    Stability/Clinical Decision Making Stable/Uncomplicated    Rehab Potential Good    PT Frequency 2x / week    PT Duration 12 weeks    PT Treatment/Interventions ADLs/Self Care Home Management;Aquatic Therapy;Cryotherapy;Moist Heat;Electrical Stimulation;Gait training;Stair training;Functional mobility training;Neuromuscular re-education;Therapeutic exercise;Therapeutic activities;Patient/family education;Manual techniques;Dry needling;Passive range of motion;Joint Manipulations;Spinal Manipulations    PT Next Visit Plan spinal mobility, postural and glute strengthening, functional strengthening, hip extension ROM    PT Home Exercise Plan Medbridge Access Code: [email or web address]    Consulted and Agree with Plan of Care Patient             Patient will benefit from skilled therapeutic intervention in order to improve the following deficits and impairments:  Improper body mechanics, Pain, Decreased mobility, Increased muscle spasms, Postural dysfunction, Decreased activity tolerance, Decreased endurance, Decreased range of motion, Decreased strength, Hypomobility, Impaired perceived functional ability, Difficulty walking  Visit Diagnosis: Chronic bilateral low back pain, unspecified whether sciatica present  Pain in thoracic spine  Abnormal posture     Problem List Patient Active Problem List   Diagnosis Date Noted   Zenker's (hypopharyngeal) diverticulum 03/22/2018   Gastroesophageal reflux disease 03/22/2018   Fatty liver  10/09/2017   BPH (benign prostatic hyperplasia) 11/21/2014   Hypothyroidism 11/21/2014   Obesity 11/21/2014   Asthma 11/21/2014   Inguinal hernia 11/21/2014   OA (osteoarthritis) 11/21/2014   H/O adenomatous polyp of colon 10/14/2014   Everlean Alstrom. Graylon Good, PT, DPT 05/10/21, 5:28 PM   [facility] [address], [place], [address] Phone: [phone]   Fax:  [phone]  Name: Daniel [name] MRN: [id number] Date of Birth: 11-03-1950

## 2021-05-12 ENCOUNTER — Ambulatory Visit: Payer: Medicare Other | Admitting: Dermatology

## 2021-05-12 ENCOUNTER — Other Ambulatory Visit: Payer: Self-pay

## 2021-05-12 DIAGNOSIS — L820 Inflamed seborrheic keratosis: Secondary | ICD-10-CM

## 2021-05-12 DIAGNOSIS — L814 Other melanin hyperpigmentation: Secondary | ICD-10-CM

## 2021-05-12 DIAGNOSIS — D229 Melanocytic nevi, unspecified: Secondary | ICD-10-CM

## 2021-05-12 DIAGNOSIS — Z1283 Encounter for screening for malignant neoplasm of skin: Secondary | ICD-10-CM

## 2021-05-12 DIAGNOSIS — L821 Other seborrheic keratosis: Secondary | ICD-10-CM

## 2021-05-12 DIAGNOSIS — Z86018 Personal history of other benign neoplasm: Secondary | ICD-10-CM

## 2021-05-12 DIAGNOSIS — L570 Actinic keratosis: Secondary | ICD-10-CM | POA: Diagnosis not present

## 2021-05-12 DIAGNOSIS — L578 Other skin changes due to chronic exposure to nonionizing radiation: Secondary | ICD-10-CM

## 2021-05-12 DIAGNOSIS — L738 Other specified follicular disorders: Secondary | ICD-10-CM

## 2021-05-12 DIAGNOSIS — D1800 Hemangioma unspecified site: Secondary | ICD-10-CM

## 2021-05-12 NOTE — Progress Notes (Signed)
Follow-Up Visit   Subjective  Daniel [name] is a 70 y.o. male who presents for the following: Total body skin exam (Hx of Dysplastic nevus mid lower back, hx of AKs) and scaly spots (Nose, forehead, ~48m). The patient presents for Total-Body Skin Exam (TBSE) for skin cancer screening and mole check.  The patient has spots, moles and lesions to be evaluated, some may be new or changing and the patient has concerns that these could be cancer.  The following portions of the chart were reviewed this encounter and updated as appropriate:   Tobacco   Allergies   Meds   Problems   Med Hx   Surg Hx   Fam Hx      Review of Systems:  No other skin or systemic complaints except as noted in HPI or Assessment and Plan.  Objective  Well appearing patient in no apparent distress; mood and affect are within normal limits.  A full examination was performed including scalp, head, eyes, ears, nose, lips, neck, chest, axillae, abdomen, back, buttocks, bilateral upper extremities, bilateral lower extremities, hands, feet, fingers, toes, fingernails, and toenails. All findings within normal limits unless otherwise noted below.  forehead x 1, nasal bridge x 1 (2) Pink scaly macules   L forehead x 1, R forehead x 3, Total = 4 Stuck on waxy paps with erythema    Assessment & Plan   History of Dysplastic Nevi - No evidence of recurrence today - Recommend regular full body skin exams - Recommend daily broad spectrum sunscreen SPF 30+ to sun-exposed areas, reapply every 2 hours as needed.  - Call if any new or changing lesions are noted between office visits  -mid lower back  Lentigines - Scattered tan macules - Due to sun exposure - Benign-appearing, observe - Recommend daily broad spectrum sunscreen SPF 30+ to sun-exposed areas, reapply every 2 hours as needed. - Call for any changes  Seborrheic Keratoses - Stuck-on, waxy, tan-brown papules and/or plaques  - Benign-appearing - Discussed benign  etiology and prognosis. - Observe - Call for any changes  Melanocytic Nevi - Tan-brown and/or pink-flesh-colored symmetric macules and papules - Benign appearing on exam today - Observation - Call clinic for new or changing moles - Recommend daily use of broad spectrum spf 30+ sunscreen to sun-exposed areas.   Hemangiomas - Red papules - Discussed benign nature - Observe - Call for any changes  Actinic Damage - Chronic condition, secondary to cumulative UV/sun exposure - diffuse scaly erythematous macules with underlying dyspigmentation - Recommend daily broad spectrum sunscreen SPF 30+ to sun-exposed areas, reapply every 2 hours as needed.  - Staying in the shade or wearing long sleeves, sun glasses (UVA+UVB protection) and wide brim hats (4-inch brim around the entire circumference of the hat) are also recommended for sun protection.  - Call for new or changing lesions.  Sebaceous Hyperplasia - Small yellow papules with a central dell - Benign - Observe   Skin cancer screening performed today.   AK (actinic keratosis) (2) forehead x 1, nasal bridge x 1  Destruction of lesion - forehead x 1, nasal bridge x 1 Complexity: simple   Destruction method: cryotherapy   Informed consent: discussed and consent obtained   Timeout:  patient name, date of birth, surgical site, and procedure verified Lesion destroyed using liquid nitrogen: Yes   Region frozen until ice ball extended beyond lesion: Yes   Outcome: patient tolerated procedure well with no complications   Post-procedure details: wound care  instructions given    Inflamed seborrheic keratosis L forehead x 1, R forehead x 3, Total = 4  Destruction of lesion - L forehead x 1, R forehead x 3, Total = 4 Complexity: simple   Destruction method: cryotherapy   Informed consent: discussed and consent obtained   Timeout:  patient name, date of birth, surgical site, and procedure verified Lesion destroyed using liquid  nitrogen: Yes   Region frozen until ice ball extended beyond lesion: Yes   Outcome: patient tolerated procedure well with no complications   Post-procedure details: wound care instructions given    Skin cancer screening   Return in about 1 year (around 05/12/2022) for Hx of Dysplastic nevi, Hx of AKs.  I, Othelia Pulling, RMA, am acting as scribe for Sarina Ser, MD . Documentation: I have reviewed the above documentation for accuracy and completeness, and I agree with the above.  Sarina Ser, MD

## 2021-05-12 NOTE — Patient Instructions (Addendum)
If You Need Anything After Your Visit  If you have any questions or concerns for your doctor, please call our main line at [phone] and press option 4 to reach your doctor's medical assistant. If no one answers, please leave a voicemail as directed and we will return your call as soon as possible. Messages left after 4 pm will be answered the following business day.   You may also send us a message via MyChart. We typically respond to MyChart messages within 1-2 business days.  For prescription refills, please ask your pharmacy to contact our office. Our fax number is [phone].  If you have an urgent issue when the clinic is closed that cannot wait until the next business day, you can page your doctor at the number below.    Please note that while we do our best to be available for urgent issues outside of office hours, we are not available 24/7.   If you have an urgent issue and are unable to reach us, you may choose to seek medical care at your doctor's office, retail clinic, urgent care center, or emergency room.  If you have a medical emergency, please immediately call 911 or go to the emergency department.  Pager Numbers  - Dr. Kowalski: [phone]  - Dr. Moye: [phone]  - Dr. Stewart: [phone]  In the event of inclement weather, please call our main line at [phone] for an update on the status of any delays or closures.  Dermatology Medication Tips: Please keep the boxes that topical medications come in in order to help keep track of the instructions about where and how to use these. Pharmacies typically print the medication instructions only on the boxes and not directly on the medication tubes.   If your medication is too expensive, please contact our office at [phone] option 4 or send us a message through MyChart.   We are unable to tell what your co-pay for medications will be in advance as this is different depending on your insurance coverage.  However, we may be able to find a substitute medication at lower cost or fill out paperwork to get insurance to cover a needed medication.   If a prior authorization is required to get your medication covered by your insurance company, please allow us 1-2 business days to complete this process.  Drug prices often vary depending on where the prescription is filled and some pharmacies may offer cheaper prices.  The website www.goodrx.com contains coupons for medications through different pharmacies. The prices here do not account for what the cost may be with help from insurance (it may be cheaper with your insurance), but the website can give you the price if you did not use any insurance.  - You can print the associated coupon and take it with your prescription to the pharmacy.  - You may also stop by our office during regular business hours and pick up a GoodRx coupon card.  - If you need your prescription sent electronically to a different pharmacy, notify our office through [facility] MyChart or by phone at [phone] option 4.     Si Usted Necesita Algo Despus de Su Visita  Tambin puede enviarnos un mensaje a travs de MyChart. Por lo general respondemos a los mensajes de MyChart en el transcurso de 1 a 2 das hbiles.  Para renovar recetas, por favor pida a su farmacia que se ponga en contacto con nuestra oficina. Nuestro nmero de fax es el [phone].  Si tiene   un asunto urgente cuando la clnica est cerrada y que no puede esperar hasta el siguiente da hbil, puede llamar/localizar a su doctor(a) al nmero que aparece a continuacin.   Por favor, tenga en cuenta que aunque hacemos todo lo posible para estar disponibles para asuntos urgentes fuera del horario de oficina, no estamos disponibles las 24 horas del da, los 7 das de la semana.   Si tiene un problema urgente y no puede comunicarse con nosotros, puede optar por buscar atencin mdica  en el consultorio de su  doctor(a), en una clnica privada, en un centro de atencin urgente o en una sala de emergencias.  Si tiene una emergencia mdica, por favor llame inmediatamente al 911 o vaya a la sala de emergencias.  Nmeros de bper  - Dr. Kowalski: [phone]  - Dra. Moye: [phone]  - Dra. Stewart: [phone]  En caso de inclemencias del tiempo, por favor llame a nuestra lnea principal al [phone] para una actualizacin sobre el estado de cualquier retraso o cierre.  Consejos para la medicacin en dermatologa: Por favor, guarde las cajas en las que vienen los medicamentos de uso tpico para ayudarle a seguir las instrucciones sobre dnde y cmo usarlos. Las farmacias generalmente imprimen las instrucciones del medicamento slo en las cajas y no directamente en los tubos del medicamento.   Si su medicamento es muy caro, por favor, pngase en contacto con nuestra oficina llamando al [phone] y presione la opcin 4 o envenos un mensaje a travs de MyChart.   No podemos decirle cul ser su copago por los medicamentos por adelantado ya que esto es diferente dependiendo de la cobertura de su seguro. Sin embargo, es posible que podamos encontrar un medicamento sustituto a menor costo o llenar un formulario para que el seguro cubra el medicamento que se considera necesario.   Si se requiere una autorizacin previa para que su compaa de seguros cubra su medicamento, por favor permtanos de 1 a 2 das hbiles para completar este proceso.  Los precios de los medicamentos varan con frecuencia dependiendo del lugar de dnde se surte la receta y alguna farmacias pueden ofrecer precios ms baratos.  El sitio web www.goodrx.com tiene cupones para medicamentos de diferentes farmacias. Los precios aqu no tienen en cuenta lo que podra costar con la ayuda del seguro (puede ser ms barato con su seguro), pero el sitio web puede darle el precio si no utiliz ningn seguro.  - Puede imprimir el cupn  correspondiente y llevarlo con su receta a la farmacia.  - Tambin puede pasar por nuestra oficina durante el horario de atencin regular y recoger una tarjeta de cupones de GoodRx.  - Si necesita que su receta se enve electrnicamente a una farmacia diferente, informe a nuestra oficina a travs de MyChart de [place] o por telfono llamando al [phone] y presione la opcin 4.   Cryotherapy Aftercare  Wash gently with soap and water everyday.   Apply Vaseline and Band-Aid daily until healed.  

## 2021-05-16 NOTE — Progress Notes (Signed)
Established patient visit   Patient: Daniel [name]   DOB: [birth year]   70 y.o. Male  MRN: [id number] Visit Date: 05/17/2021  Today's healthcare provider: Lavon Paganini, MD   Chief Complaint  Patient presents with   Fatigue   I,Sulibeya S Dimas,acting as a scribe for Lavon Paganini, MD.,have documented all relevant documentation on the behalf of Lavon Paganini, MD,as directed by  Lavon Paganini, MD while in the presence of Lavon Paganini, MD.  Subjective    HPI  Fatigue  He reports chronic fatigue which he describes as a lack of energy and feeling weak. It began about a year ago and occurs a few days a week and upon awaking every day. It is described as moderate and staying constant. He has not started new medications around the time the fatigue started.   Started pravastatin a year ago and got significant joint pain and back pain. He was switched to Zetia and did not tolerate this either.  In PT for the back pain. No longer on either of these meds, but still feels "bad"  Had COVID a few months ago and continues to have nagging cough.  Has Fatty liver and liver fibrosis - followed by GI   Associated symptoms: No arthralgias Yes bleeding  No melena No chest discomfort  No heart palpitations No heart racing   No dyspnea No feeling depressed  No feeling anxious or under stress No fevers  No loss of appetite No nausea  No vomiting No sleeping problems    Wt Readings from Last 3 Encounters:  05/17/21 215 lb (97.5 kg)  02/16/21 214 lb (97.1 kg)  11/30/20 214 lb 12.8 oz (97.4 kg)    Lab Results  Component Value Date   WBC 6.6 05/27/2020   HGB 16.7 05/27/2020   HCT 48.5 05/27/2020   MCV 88 05/27/2020   PLT 230 05/27/2020   Lab Results  Component Value Date   TSH 2.010 05/27/2020   Lab Results  Component Value Date   NA 141 02/16/2021   K 3.6 02/16/2021   CO2 21 02/16/2021   BUN 17 02/16/2021   CREATININE 0.85 02/16/2021   CALCIUM 8.8  02/16/2021   GLUCOSE 91 02/16/2021     ---------------------------------------------------------------------------------------------------   Medications: Outpatient Medications Prior to Visit  Medication Sig   Famotidine (PEPCID PO) Take 1 tablet by mouth daily.   levothyroxine (SYNTHROID) 100 MCG tablet TAKE 1 TABLET BY MOUTH EVERY DAY BEFORE BREAKFAST   Multiple Vitamin (MULTIVITAMIN) capsule Take 1 capsule daily by mouth.   naproxen (NAPROSYN) 500 MG tablet Take 1 tablet (500 mg total) by mouth 2 (two) times daily as needed.   pantoprazole (PROTONIX) 40 MG tablet TAKE 1 TABLET(40 MG) BY MOUTH TWICE DAILY   [DISCONTINUED] Ginkgo Biloba 40 MG TABS Take by mouth. (Patient not taking: Reported on 05/17/2021)   [DISCONTINUED] Omega-3 Fatty Acids (FISH OIL) 1200 MG CPDR Take by mouth. (Patient not taking: Reported on 05/17/2021)   [DISCONTINUED] predniSONE (DELTASONE) 10 MG tablet Take 1 tablet (10 mg total) by mouth daily with breakfast. (Patient not taking: Reported on 05/17/2021)   No facility-administered medications prior to visit.    Review of Systems  Constitutional:  Positive for activity change and fatigue. Negative for appetite change, chills, fever and unexpected weight change.  HENT: Negative.    Respiratory:  Positive for cough. Negative for chest tightness and shortness of breath.   Cardiovascular:  Negative for chest pain, palpitations and leg swelling.  Gastrointestinal:  Positive for blood in stool and constipation. Negative for abdominal pain, diarrhea and vomiting.  Musculoskeletal:  Positive for arthralgias and back pain.  Neurological:  Positive for weakness. Negative for dizziness.  Psychiatric/Behavioral:  Negative for sleep disturbance. The patient is not nervous/anxious.       Objective    BP 108/80 (BP Location: Right Arm, Patient Position: Sitting, Cuff Size: Large)    Pulse 66    Temp 98.4 F (36.9 C) (Oral)    Resp 16    Wt 215 lb (97.5 kg)    SpO2 95%     BMI 27.60 kg/m  {Show previous vital signs (optional):23777}  Physical Exam Vitals reviewed.  Constitutional:      General: He is not in acute distress.    Appearance: Normal appearance. He is not diaphoretic.  HENT:     Head: Normocephalic and atraumatic.  Eyes:     General: No scleral icterus.    Conjunctiva/sclera: Conjunctivae normal.  Cardiovascular:     Rate and Rhythm: Normal rate and regular rhythm.     Pulses: Normal pulses.     Heart sounds: Normal heart sounds. No murmur heard. Pulmonary:     Effort: Pulmonary effort is normal. No respiratory distress.     Breath sounds: Normal breath sounds. No wheezing or rhonchi.  Abdominal:     General: There is no distension.     Palpations: Abdomen is soft.     Tenderness: There is no abdominal tenderness.  Musculoskeletal:     Cervical back: Neck supple.     Right lower leg: No edema.     Left lower leg: No edema.     Comments: Stiff back while walking and leaning forward  Lymphadenopathy:     Cervical: No cervical adenopathy.  Skin:    General: Skin is warm and dry.     Capillary Refill: Capillary refill takes less than 2 seconds.     Findings: No rash.  Neurological:     Mental Status: He is alert and oriented to person, place, and time. Mental status is at baseline.     Sensory: No sensory deficit.     Motor: No weakness.  Psychiatric:        Mood and Affect: Mood normal.        Behavior: Behavior normal.      No results found for any visits on 05/17/21.  Assessment & Plan     1. Malaise 2. General weakness - vague symptoms of malaise, fatigue weakness - no focal deficits on exam - check labs for any underlying cause - TSH - CBC - Comprehensive metabolic panel - VITAMIN D 25 Hydroxy (Vit-D Deficiency, Fractures) - B12  3. Fatty liver - followed by GI - recheck labs - CBC - Comprehensive metabolic panel  4. Other specified hypothyroidism - recheck [place] and continue synthroid - TSH  5. Chronic  bilateral low back pain without sciatica - continue PT - consider f/u with Ortho   6. Long-term use of high-risk medication - TSH - CBC - Comprehensive metabolic panel - VITAMIN D 25 Hydroxy (Vit-D Deficiency, Fractures) - B12   Return for as scheduled.      I, Lavon Paganini, MD, have reviewed all documentation for this visit. The documentation on 05/17/21 for the exam, diagnosis, procedures, and orders are all accurate and complete.   Cricket Goodlin, Dionne Bucy, MD, MPH [facility]

## 2021-05-17 ENCOUNTER — Encounter: Payer: Self-pay | Admitting: Family Medicine

## 2021-05-17 ENCOUNTER — Ambulatory Visit (INDEPENDENT_AMBULATORY_CARE_PROVIDER_SITE_OTHER): Payer: Medicare Other | Admitting: Family Medicine

## 2021-05-17 ENCOUNTER — Other Ambulatory Visit: Payer: Self-pay

## 2021-05-17 ENCOUNTER — Encounter: Payer: Self-pay | Admitting: Dermatology

## 2021-05-17 VITALS — BP 108/80 | HR 66 | Temp 98.4°F | Resp 16 | Wt 215.0 lb

## 2021-05-17 DIAGNOSIS — Z79899 Other long term (current) drug therapy: Secondary | ICD-10-CM | POA: Diagnosis not present

## 2021-05-17 DIAGNOSIS — E038 Other specified hypothyroidism: Secondary | ICD-10-CM | POA: Diagnosis not present

## 2021-05-17 DIAGNOSIS — K760 Fatty (change of) liver, not elsewhere classified: Secondary | ICD-10-CM | POA: Diagnosis not present

## 2021-05-17 DIAGNOSIS — R5381 Other malaise: Secondary | ICD-10-CM | POA: Diagnosis not present

## 2021-05-17 DIAGNOSIS — R739 Hyperglycemia, unspecified: Secondary | ICD-10-CM | POA: Diagnosis not present

## 2021-05-17 DIAGNOSIS — G8929 Other chronic pain: Secondary | ICD-10-CM

## 2021-05-17 DIAGNOSIS — R531 Weakness: Secondary | ICD-10-CM | POA: Diagnosis not present

## 2021-05-17 DIAGNOSIS — M545 Low back pain, unspecified: Secondary | ICD-10-CM

## 2021-05-18 ENCOUNTER — Ambulatory Visit: Payer: Medicare Other | Admitting: Physical Therapy

## 2021-05-18 ENCOUNTER — Encounter: Payer: Self-pay | Admitting: Physical Therapy

## 2021-05-18 DIAGNOSIS — R293 Abnormal posture: Secondary | ICD-10-CM

## 2021-05-18 DIAGNOSIS — M546 Pain in thoracic spine: Secondary | ICD-10-CM | POA: Diagnosis not present

## 2021-05-18 DIAGNOSIS — G8929 Other chronic pain: Secondary | ICD-10-CM | POA: Diagnosis not present

## 2021-05-18 DIAGNOSIS — M545 Low back pain, unspecified: Secondary | ICD-10-CM | POA: Diagnosis not present

## 2021-05-18 LAB — COMPREHENSIVE METABOLIC PANEL
ALT: 15 IU/L (ref 0–44)
AST: 16 IU/L (ref 0–40)
Albumin/Globulin Ratio: 2.2 (ref 1.2–2.2)
Albumin: 4.2 g/dL (ref 3.7–4.7)
Alkaline Phosphatase: 61 IU/L (ref 44–121)
BUN/Creatinine Ratio: 19 (ref 10–24)
BUN: 19 mg/dL (ref 8–27)
Bilirubin Total: 0.8 mg/dL (ref 0.0–1.2)
CO2: 24 mmol/L (ref 20–29)
Calcium: 9.3 mg/dL (ref 8.6–10.2)
Chloride: 102 mmol/L (ref 96–106)
Creatinine, Ser: 1 mg/dL (ref 0.76–1.27)
Globulin, Total: 1.9 g/dL (ref 1.5–4.5)
Glucose: 102 mg/dL — ABNORMAL HIGH (ref 70–99)
Potassium: 4.3 mmol/L (ref 3.5–5.2)
Sodium: 141 mmol/L (ref 134–144)
Total Protein: 6.1 g/dL (ref 6.0–8.5)
eGFR: 80 mL/min/{1.73_m2} (ref >59)

## 2021-05-18 LAB — CBC
Hematocrit: 46.5 % (ref 37.5–51.0)
Hemoglobin: 15.6 g/dL (ref 13.0–17.7)
MCH: 29.8 pg (ref 26.6–33.0)
MCHC: 33.5 g/dL (ref 31.5–35.7)
MCV: 89 fL (ref 79–97)
Platelets: 213 10*3/uL (ref 150–450)
RBC: 5.24 x10E6/uL (ref 4.14–5.80)
RDW: 13.2 % (ref 11.6–15.4)
WBC: 6.2 10*3/uL (ref 3.4–10.8)

## 2021-05-18 LAB — TSH: TSH: 2.3 u[IU]/mL (ref 0.450–4.500)

## 2021-05-18 LAB — VITAMIN B12: Vitamin B-12: 363 pg/mL (ref 232–1245)

## 2021-05-18 LAB — VITAMIN D 25 HYDROXY (VIT D DEFICIENCY, FRACTURES): Vit D, 25-Hydroxy: 24.1 ng/mL — ABNORMAL LOW (ref 30.0–100.0)

## 2021-05-18 NOTE — Therapy (Signed)
[facility] [address] [place], [address] Phone: [phone]   Fax:  [phone]  Physical Therapy Treatment  Patient Details  Name: Daniel [name] MRN: [id number] Date of Birth: Feb 01, 1951 Referring Provider (PT): Jerrol Banana., MD   Encounter Date: 05/18/2021   PT End of Session - 05/18/21 1446     Visit Number 21    Number of Visits 38    Date for PT Re-Evaluation 06/16/21    Authorization Type [employer] reporting period from 05/10/2021    Progress Note Due on Visit 20    PT Start Time 1440    PT Stop Time 1518    PT Time Calculation (min) 38 min    Equipment Utilized During Treatment Gait belt    Activity Tolerance Patient tolerated treatment well    Behavior During Therapy [facility] for tasks assessed/performed             Past Medical History:  Diagnosis Date   Actinic keratosis    Asthma    Eczema    Hx of dysplastic nevus 06/06/2006   Mid lower back, slight atypia   Hypothyroidism    Seasonal allergies    Thyroid disease     Past Surgical History:  Procedure Laterality Date   CATARACT EXTRACTION     COLON SURGERY     COLONOSCOPY WITH PROPOFOL N/A 11/18/2014   Procedure: COLONOSCOPY WITH PROPOFOL;  Surgeon: [name], MD;  Location: [facility];  Service: Endoscopy;  Laterality: N/A;   EYE SURGERY     KNEE SURGERY     NASAL SEPTUM SURGERY      There were no vitals filed for this visit.   Subjective Assessment - 05/18/21 1444     Subjective Patient reports his back seems extra stiff today but not painful. He went to his PCP yesterday and had bloodwork that showed his fasting glucose a bit high and vitamin D low. He wonders if this is affecting his back pain and has not yet heard what the doctor would like him to do about it. Her PCP suggested he see an orthopedic for his back and he is planning to do that. His PCP also told him to continue PT.    Pertinent History  Patient is a 70 y.o. male who presents to outpatient physical therapy with a referral for medical diagnosis acute bilateral low back pain without sciatica. This patient's chief complaints consist of low back pain with radiation into R > L posterior glute and up to mid thoracic spine, leading to the following functional deficits: cooking for church, prolonged sitting, prolonged standing, yard work, Architectural technologist, sit <> stand, stairs, bending, lifting, staining deck, shopping, walking, social participation.    Relevant past medical history and comorbidities include asthma, Zenker's (hypopharyngeal) diverticulum, GERD, fatty liver, hypothyroidism, BPH, OA, hx adeomatous polyp of colon, obesity, inguinal hernia, hx of dysplastic nevus, colon surgery (7 inches removed of colon), cataract extraction.  Patient denies hx of cancer, stroke, seizures, lung problem, major cardiac events, diabetes, unexplained weight loss, changes in bowel or bladder problems, new onset stumbling or dropping things, spinal surgery.    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   How long can you stand comfortably? 1-1.5 hours    Diagnostic tests Lumbar radiograph report 11/30/2020: "IMPRESSION:  1. Moderate multilevel lumbar spondylosis, progressed from prior.  2. Moderate-large volume of stool throughout the visualized colon."    Patient Stated Goals "to be able to stay on my feet as long as I need to without my back killing me"    Currently in Pain? No/denies                TREATMENT:    Therapeutic exercise: to centralize symptoms and improve ROM, strength, muscular endurance, and activity tolerance required for successful completion of functional activities.  - NuStep level 1-4 using bilateral  upper and lower extremities. Seat/handle setting 12. For improved extremity mobility, muscular endurance, and activity tolerance; and to induce the analgesic effect of aerobic exercise, stimulate improved joint nutrition, and prepare body structures and systems for following interventions. x 5:10 minutes. Average SPM = 75.  (Manual therapy - see below) - prone press up 2x10 - glute thrusts sitting on airex on floor with back on plinth with pillow under upper back to improve comfort. 3x10. PT counted out loud 2 seconds with improved hold time. - standing alternating TRX lunge to warrior 1 position with arm drop, 2x10 each side with heavy tactile cues.  - standing lower trap "Y" 3x10 with yellow theraband  Manual therapy: to reduce pain and tissue tension, improve range of motion, neuromodulation, in order to promote improved ability to complete functional activities. HOOKLYING  - PROM hip flexor stretch in Gaenslen's position, 3x45 seconds each side.  PRONE - CPA grade III-IV with KE wedge from T3-L5, 1x20  reps/segment for improve joint motion and joint nutrition improved ranges of mobility.    Pt required multimodal cuing for proper technique and to facilitate improved neuromuscular control, strength, range of motion, and functional ability resulting in improved performance and form.   HOME EXERCISE PROGRAM Access Code: [email or web address] URL: [email or web address] Date: 03/22/2021 Prepared by: Rosita Kea   Exercises Sidelying Thoracic Rotation with Open Book - 1-2 x daily - 2 sets - 10 reps - 1 breath hold Quadruped Thoracic Rotation Full Range with Hand on Neck - 1 x daily - 2 sets - 10 reps Bird Dog - 1 x daily - 3 sets - 10 reps - 1-5 seconds hold [place]ec Stretch at 90 Degrees Abduction - 1 x daily - 3 reps - 30 hold Standing Lumbar Extension at [place] 4 x daily - 2 sets - 10 reps Standing Lumbar Extension with Counter - 4 x daily - 2 sets - 10 reps Supine Hip  Extension on Bench - 1 x daily - 3 sets - 10 reps - 5 seconds hold   [email or web address] [id number] [[id number]]   Seated Hip Flexor Stretch  -  Repeat 3 Times, Hold 30 Seconds, Perform 1 Times a Day    PT Education - 05/18/21 1446     Education Details form/technique with exercises    Person(s) Educated Patient    Methods Explanation;Demonstration;Tactile cues;Verbal cues    Comprehension Verbalized understanding;Returned demonstration;Verbal cues required;Tactile cues required;Need further instruction              PT Short Term Goals - 01/19/21 1126       PT SHORT TERM GOAL #1   Title Be independent with initial home exercise program for self-management of symptoms.    Baseline initial HEP provided at IE (12/29/2020);    Time 2    Period Weeks    Status Achieved    Target Date 01/13/21  PT Long Term Goals - 05/10/21 1725       PT LONG TERM GOAL #1   Title Be independent with a long-term home exercise program for self-management of symptoms.    Baseline Initial HEP provided at IE (12/29/2020); participating in appropriate HEP (02/22/2021); continues to participate in appropriate HEP with some exceptions when busy (03/24/2021); Patient reports participating well (05/10/2021);    Time 12    Period Weeks    Status Partially Met   TARGET DATE FOR ALL LONG TERM GOALS: 03/23/2021. Updated to 06/16/2021 for unmet goals.     PT LONG TERM GOAL #2   Title Demonstrate improved FOTO to equal or greater than 75 by visit #10 to demonstrate improvement in overall condition and self-reported functional ability.    Baseline 63 (12/29/2020); 56 (02/22/2021); 65 at visit #14 (03/24/2021); 58 at visit 20 (05/10/2021);    Time 12    Period Weeks    Status On-going      PT LONG TERM GOAL #3   Title Patient will improve lumbar extension to equal or greater than 50% to improve upright posture for improved standing tolerance for cooking.    Baseline 25% (12/29/2020); 25% less pain (02/22/2021);  Extension: 35% with pain across low back.(03/24/2021; 05/10/2021);    Time 12    Period Weeks    Status Partially Met      PT LONG TERM GOAL #4   Title Reduce pain with functional activities to equal or less than 1/10 to allow patient to complete usual activities including ADLs, IADLs, cooking, and social engagement with less difficulty.    Baseline 10/10 with prolonged standing (12/29/2020); 8/10 (02/22/2021); 6/10 while traveling (03/24/2021); 4/10 with prolonged standing (05/10/2021);    Time 12    Period Weeks    Status Partially Met      PT LONG TERM GOAL #5   Title Complete community, work and/or recreational activities without limitation due to current condition.    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, stairs, bending, lifting, staining deck, shopping, walking, social participation (12/29/2020); stairs improved, can stand a bit longer and walking is better (02/22/2021); reports improved tolerance for functional activities (03/24/2021); continues to have difficulty with prolonged standing and activities that require standing or upright posture in sitting - reports he feels 40-50% better since starting PT (05/10/2021);    Time 12    Period Weeks    Status Partially Met                   Plan - 05/18/21 1518     Clinical Impression Statement Patient tolerated treatment well and seemed to be slightly less stiff with manual therapy. Demonstrated improved ability to perform prone press up. Continues to struggle with posture and needs cuing to improve form and tempo. Patient would benefit from continued management of limiting condition by skilled physical therapist to address remaining impairments and functional limitations to work towards stated goals and return to PLOF or maximal functional independence.    Personal Factors and Comorbidities Time since  onset of injury/illness/exacerbation;Past/Current Experience;Comorbidity 3+    Comorbidities Relevant past medical history and comorbidities include asthma, Zenker's (hypopharyngeal) diverticulum, GERD, fatty liver, hypothyroidism, BPH, OA, hx adeomatous polyp of colon, obesity, inguinal hernia, hx of dysplastic nevus, colon surgery (7 inches removed of colon), cataract extraction.    Examination-Activity Limitations Carry;Bend;Caring for Others;Lift;Stairs;Squat;Stand;Locomotion Level;Sit;Dressing;Transfers    Examination-Participation  Restrictions Church;Community Activity;Meal Prep;Interpersonal Relationship;Yard Work    Stability/Clinical Decision Making Stable/Uncomplicated    Rehab Potential Good    PT Frequency 2x / week    PT Duration 12 weeks    PT Treatment/Interventions ADLs/Self Care Home Management;Aquatic Therapy;Cryotherapy;Moist Heat;Electrical Stimulation;Gait training;Stair training;Functional mobility training;Neuromuscular re-education;Therapeutic exercise;Therapeutic activities;Patient/family education;Manual techniques;Dry needling;Passive range of motion;Joint Manipulations;Spinal Manipulations    PT Next Visit Plan spinal mobility, postural and glute strengthening, functional strengthening, hip extension ROM    PT Home Exercise Plan Medbridge Access Code: [email or web address]    Consulted and Agree with Plan of Care Patient             Patient will benefit from skilled therapeutic intervention in order to improve the following deficits and impairments:  Improper body mechanics, Pain, Decreased mobility, Increased muscle spasms, Postural dysfunction, Decreased activity tolerance, Decreased endurance, Decreased range of motion, Decreased strength, Hypomobility, Impaired perceived functional ability, Difficulty walking  Visit Diagnosis: Chronic bilateral low back pain, unspecified whether sciatica present  Pain in thoracic spine  Abnormal posture     Problem List Patient  Active Problem List   Diagnosis Date Noted   Zenker's (hypopharyngeal) diverticulum 03/22/2018   Gastroesophageal reflux disease 03/22/2018   Fatty liver 10/09/2017   BPH (benign prostatic hyperplasia) 11/21/2014   Hypothyroidism 11/21/2014   Obesity 11/21/2014   Asthma 11/21/2014   Inguinal hernia 11/21/2014   OA (osteoarthritis) 11/21/2014   H/O adenomatous polyp of colon 10/14/2014    Everlean Alstrom. Graylon Good, PT, DPT 05/18/21, 3:19 PM   [facility] [address], [place], [address] Phone: [phone]   Fax:  [phone]  Name: Daniel [name] MRN: [id number] Date of Birth: 02/12/1951

## 2021-05-20 LAB — HGB A1C W/O EAG: Hgb A1c MFr Bld: 5.6 % (ref 4.8–5.6)

## 2021-05-20 LAB — SPECIMEN STATUS REPORT

## 2021-05-25 ENCOUNTER — Ambulatory Visit: Payer: Medicare Other | Admitting: Physical Therapy

## 2021-05-25 ENCOUNTER — Encounter: Payer: Self-pay | Admitting: Physical Therapy

## 2021-05-25 DIAGNOSIS — G8929 Other chronic pain: Secondary | ICD-10-CM

## 2021-05-25 DIAGNOSIS — R293 Abnormal posture: Secondary | ICD-10-CM

## 2021-05-25 DIAGNOSIS — M546 Pain in thoracic spine: Secondary | ICD-10-CM

## 2021-05-25 DIAGNOSIS — M545 Low back pain, unspecified: Secondary | ICD-10-CM | POA: Diagnosis not present

## 2021-05-25 NOTE — Therapy (Signed)
[facility] [address] [place], [address] Phone: [phone]   Fax:  [phone]  Physical Therapy Treatment  Patient Details  Name: ZYREN SEVIGNY MRN: [id number] Date of Birth: [birth year] Referring Provider (PT): Jerrol Banana., MD   Encounter Date: 05/25/2021   PT End of Session - 05/25/21 1032     Visit Number 22    Number of Visits 32    Date for PT Re-Evaluation 06/16/21    Authorization Type [employer] reporting period from 05/10/2021    Progress Note Due on Visit 30    PT Start Time 0950    PT Stop Time 1030    PT Time Calculation (min) 40 min    Equipment Utilized During Treatment Gait belt    Behavior During Therapy [facility] for tasks assessed/performed             Past Medical History:  Diagnosis Date   Actinic keratosis    Asthma    Eczema    Hx of dysplastic nevus 06/06/2006   Mid lower back, slight atypia   Hypothyroidism    Seasonal allergies    Thyroid disease     Past Surgical History:  Procedure Laterality Date   CATARACT EXTRACTION     COLON SURGERY     COLONOSCOPY WITH PROPOFOL N/A 11/18/2014   Procedure: COLONOSCOPY WITH PROPOFOL;  Surgeon: [name], MD;  Location: [facility];  Service: Endoscopy;  Laterality: N/A;   EYE SURGERY     KNEE SURGERY     NASAL SEPTUM SURGERY      There were no vitals filed for this visit.   Subjective Assessment - 05/25/21 0954     Subjective Patient reports he is feeling okay today. He states he picked up some small limbs in the yard while wearing his back belt but he had back pain for about 15 min while doing this. It resolved in about 5 min after sititng down afterwards. He states the repetative bending was what bothered him not the weight of the sticks. Today he states his back feels stiff but not painful. He states his walking attempts have been going well. He has been walking 2 blocks.    Pertinent History Patient is a  70 y.o. male who presents to outpatient physical therapy with a referral for medical diagnosis acute bilateral low back pain without sciatica. This patient's chief complaints consist of low back pain with radiation into R > L posterior glute and up to mid thoracic spine, leading to the following functional deficits: cooking for church, prolonged sitting, prolonged standing, yard work, Architectural technologist, sit <> stand, stairs, bending, lifting, staining deck, shopping, walking, social participation.    Relevant past medical history and comorbidities include asthma, Zenker's (hypopharyngeal) diverticulum, GERD, fatty liver, hypothyroidism, BPH, OA, hx adeomatous polyp of colon, obesity, inguinal hernia, hx of dysplastic nevus, colon surgery (7 inches removed of colon), cataract extraction.  Patient denies hx of cancer, stroke, seizures, lung problem, major cardiac events, diabetes, unexplained weight loss, changes in bowel or bladder problems, new onset stumbling or dropping things, spinal surgery.    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   How long can you stand comfortably? 1-1.5 hours    Diagnostic tests Lumbar radiograph report 11/30/2020: "IMPRESSION:  1. Moderate multilevel lumbar  spondylosis, progressed from prior.  2. Moderate-large volume of stool throughout the visualized colon."    Patient Stated Goals "to be able to stay on my feet as long as I need to without my back killing me"    Currently in Pain? No/denies                TREATMENT:    Therapeutic exercise: to centralize symptoms and improve ROM, strength, muscular endurance, and activity tolerance required for successful completion of functional activities.  - NuStep level 1-4 using bilateral upper and  lower extremities. Seat/handle setting 12. For improved extremity mobility, muscular endurance, and activity tolerance; and to induce the analgesic effect of aerobic exercise, stimulate improved joint nutrition, and prepare body structures and systems for following interventions. x 5:07 minutes. Average SPM = 74. (Manual therapy - see below) - prone press up 2x10 - glute thrusts sitting on airex on floor with back on plinth with pillow under upper back to improve comfort. 3x10. cuing to slow down.  - standing alternating TRX lunge to warrior 1 position with arm drop, 2x10 each side with mod tactile cues.  - standing lower trap "Y" 3x10 with yellow theraband   Manual therapy: to reduce pain and tissue tension, improve range of motion, neuromodulation, in order to promote improved ability to complete functional activities. HOOKLYING  - PROM hip flexor stretch in Gaenslen's position, 3x30 seconds each side.  PRONE - CPA grade III-IV with KE wedge from T3-L5, 1x20-40  reps/segment for improve joint motion and joint nutrition improved ranges of mobility. Additional reps performed at approx T12 due to report of pain there but it worsened with more reps and B LE started to feel "weak" so discontinued - weak feeling went away.    Pt required multimodal cuing for proper technique and to facilitate improved neuromuscular control, strength, range of motion, and functional ability resulting in improved performance and form.   HOME EXERCISE PROGRAM Access Code: [email or web address] URL: [email or web address] Date: 03/22/2021 Prepared by: Rosita Kea   Exercises Sidelying Thoracic Rotation with Open Book - 1-2 x daily - 2 sets - 10 reps - 1 breath hold Quadruped Thoracic Rotation Full Range with Hand on Neck - 1 x daily - 2 sets - 10 reps Bird Dog - 1 x daily - 3 sets - 10 reps - 1-5 seconds hold Doorway Pec Stretch at 90 Degrees Abduction - 1 x daily - 3 reps - 30 hold Standing Lumbar Extension at  [place] 4 x daily - 2 sets - 10 reps Standing Lumbar Extension with Counter - 4 x daily - 2 sets - 10 reps Supine Hip Extension on Bench - 1 x daily - 3 sets - 10 reps - 5 seconds hold   [email or web address] [id number] [[id number]]   Seated Hip Flexor Stretch  -  Repeat 3 Times, Hold 30 Seconds, Perform 1 Times a Day     PT Education - 05/25/21 1031     Education Details form/technique with exercises    Person(s) Educated Patient    Methods Explanation;Demonstration;Tactile cues;Verbal cues    Comprehension Verbalized understanding;Returned demonstration;Verbal cues required;Tactile cues required;Need further instruction              PT Short Term Goals - 01/19/21 1126       PT SHORT TERM GOAL #1   Title Be independent with initial home exercise program for self-management of symptoms.    Baseline initial HEP provided at [facility] (12/29/2020);    Time  2    Period Weeks    Status Achieved    Target Date 01/13/21               PT Long Term Goals - 05/10/21 1725       PT LONG TERM GOAL #1   Title Be independent with a long-term home exercise program for self-management of symptoms.    Baseline Initial HEP provided at [facility] (12/29/2020); participating in appropriate HEP (02/22/2021); continues to participate in appropriate HEP with some exceptions when busy (03/24/2021); Patient reports participating well (05/10/2021);    Time 12    Period Weeks    Status Partially Met   TARGET DATE FOR ALL LONG TERM GOALS: 03/23/2021. Updated to 06/16/2021 for unmet goals.     PT LONG TERM GOAL #2   Title Demonstrate improved FOTO to equal or greater than 75 by visit #10 to demonstrate improvement in overall condition and self-reported functional ability.    Baseline 63 (12/29/2020); 56 (02/22/2021); 65 at visit #14 (03/24/2021); 58 at visit 20 (05/10/2021);    Time 12    Period Weeks    Status On-going      PT LONG TERM GOAL #3   Title Patient will improve lumbar extension to equal or greater than 50%  to improve upright posture for improved standing tolerance for cooking.    Baseline 25% (12/29/2020); 25% less pain (02/22/2021); Extension: 35% with pain across low back.(03/24/2021; 05/10/2021);    Time 12    Period Weeks    Status Partially Met      PT LONG TERM GOAL #4   Title Reduce pain with functional activities to equal or less than 1/10 to allow patient to complete usual activities including ADLs, IADLs, cooking, and social engagement with less difficulty.    Baseline 10/10 with prolonged standing (12/29/2020); 8/10 (02/22/2021); 6/10 while traveling (03/24/2021); 4/10 with prolonged standing (05/10/2021);    Time 12    Period Weeks    Status Partially Met      PT LONG TERM GOAL #5   Title Complete community, work and/or recreational activities without limitation due to current condition.    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, stairs, bending, lifting, staining deck, shopping, walking, social participation (12/29/2020); stairs improved, can stand a bit longer and walking is better (02/22/2021); reports improved tolerance for functional activities (03/24/2021); continues to have difficulty with prolonged standing and activities that require standing or upright posture in sitting - reports he feels 40-50% better since starting PT (05/10/2021);    Time 12    Period Weeks    Status Partially Met                   Plan - 05/25/21 1030     Clinical Impression Statement Patient tolerated treatment with some discomfort due to low back pain today. Was especially tender at approx T12 and L5 during CPAs and his legs started to feel weak with additional reps at T12 (recoverd with rest), so they were discontinued at that level. Continues to struggle with upright posture and feelings of stiffness as well as pain with standing and bending activities. Session  continued to focus on improving joint mobility and postural strength/endurance. He was feeling a little more off balance today which he states tends to correlate with back pain. He also had more difficulty with fatigue today. Patient would benefit from continued management  of limiting condition by skilled physical therapist to address remaining impairments and functional limitations to work towards stated goals and return to PLOF or maximal functional independence.    Personal Factors and Comorbidities Time since onset of injury/illness/exacerbation;Past/Current Experience;Comorbidity 3+    Comorbidities Relevant past medical history and comorbidities include asthma, Zenker's (hypopharyngeal) diverticulum, GERD, fatty liver, hypothyroidism, BPH, OA, hx adeomatous polyp of colon, obesity, inguinal hernia, hx of dysplastic nevus, colon surgery (7 inches removed of colon), cataract extraction.    Examination-Activity Limitations Carry;Bend;Caring for Others;Lift;Stairs;Squat;Stand;Locomotion Level;Sit;Dressing;Transfers    Examination-Participation Restrictions Church;Community Activity;Meal Prep;Interpersonal Relationship;Yard Work    Stability/Clinical Decision Making Stable/Uncomplicated    Rehab Potential Good    PT Frequency 2x / week    PT Duration 12 weeks    PT Treatment/Interventions ADLs/Self Care Home Management;Aquatic Therapy;Cryotherapy;Moist Heat;Electrical Stimulation;Gait training;Stair training;Functional mobility training;Neuromuscular re-education;Therapeutic exercise;Therapeutic activities;Patient/family education;Manual techniques;Dry needling;Passive range of motion;Joint Manipulations;Spinal Manipulations    PT Next Visit Plan spinal mobility, postural and glute strengthening, functional strengthening, hip extension ROM    PT Home Exercise Plan Medbridge Access Code: [email or web address]    Consulted and Agree with Plan of Care Patient             Patient will benefit from skilled  therapeutic intervention in order to improve the following deficits and impairments:  Improper body mechanics, Pain, Decreased mobility, Increased muscle spasms, Postural dysfunction, Decreased activity tolerance, Decreased endurance, Decreased range of motion, Decreased strength, Hypomobility, Impaired perceived functional ability, Difficulty walking  Visit Diagnosis: Chronic bilateral low back pain, unspecified whether sciatica present  Pain in thoracic spine  Abnormal posture     Problem List Patient Active Problem List   Diagnosis Date Noted   Zenker's (hypopharyngeal) diverticulum 03/22/2018   Gastroesophageal reflux disease 03/22/2018   Fatty liver 10/09/2017   BPH (benign prostatic hyperplasia) 11/21/2014   Hypothyroidism 11/21/2014   Obesity 11/21/2014   Asthma 11/21/2014   Inguinal hernia 11/21/2014   OA (osteoarthritis) 11/21/2014   H/O adenomatous polyp of colon 10/14/2014    Everlean Alstrom. Graylon Good, PT, DPT 05/25/21, 10:35 AM  [facility] [address] [place], [address] Phone: [phone]   Fax:  [phone]  Name: MASSIMILIANO ROHLEDER MRN: [id number] Date of Birth: 1950/06/20

## 2021-05-27 IMAGING — US US ABDOMEN COMPLETE
1 series · 14 of 25 positions shown · non-contrast
Comparison: 06/26/2018

CLINICAL DATA: Hepatic steatosis, liver fibrosis

EXAM:
ABDOMEN ULTRASOUND COMPLETE

[Series 1: us abdomen complete · 0.28mm/px · 14 of 79 slices shown]
[im 1/79]
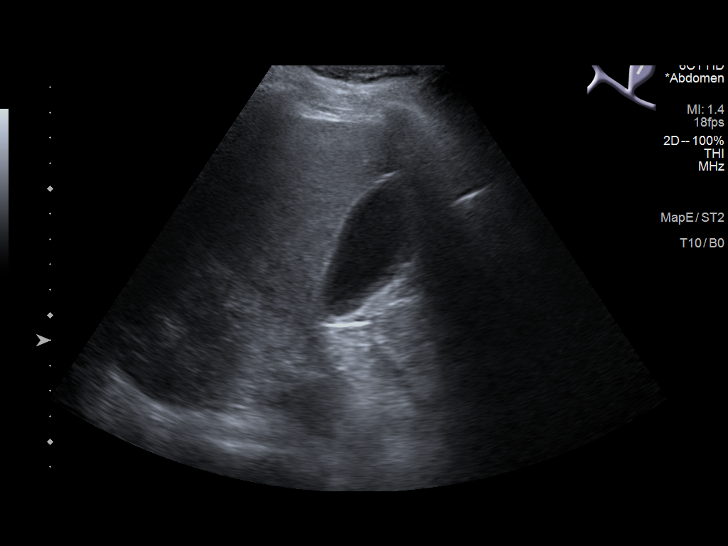
[im 7/79]
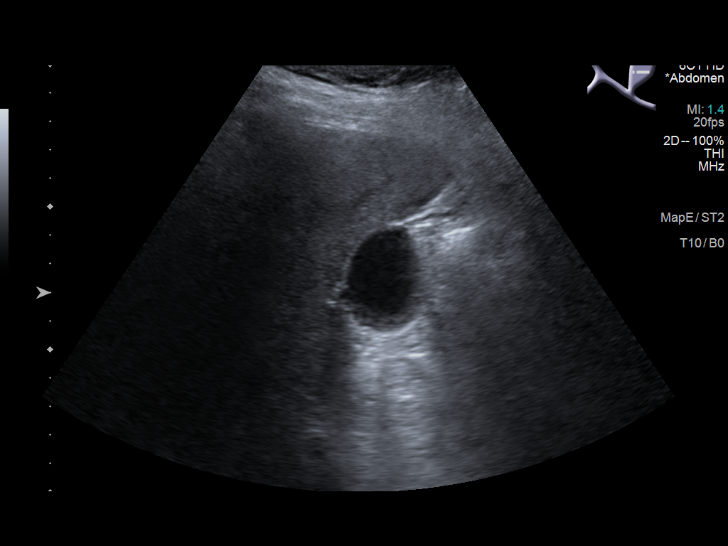
[im 14/79]
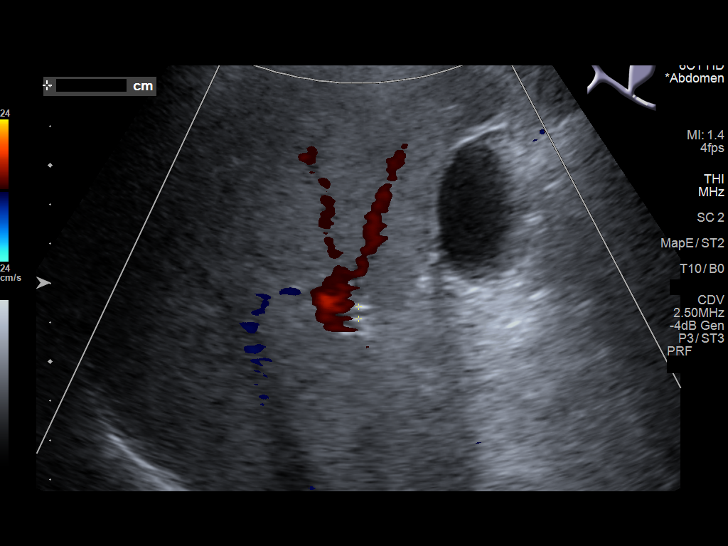
[im 20/79]
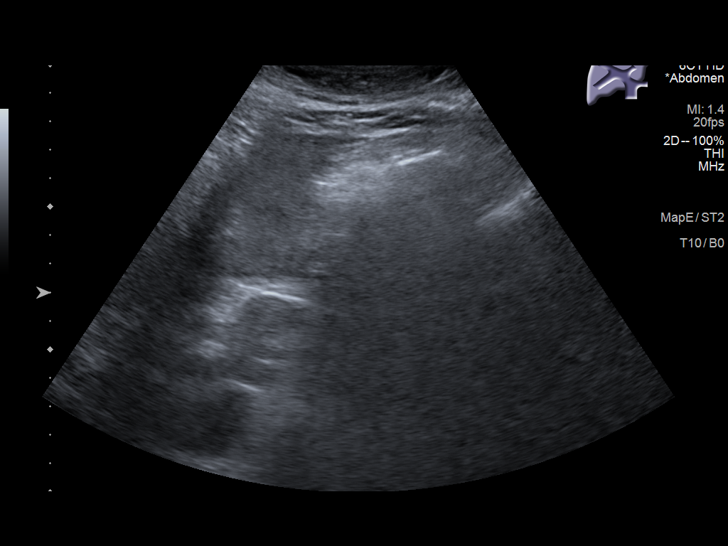
[im 27/79]
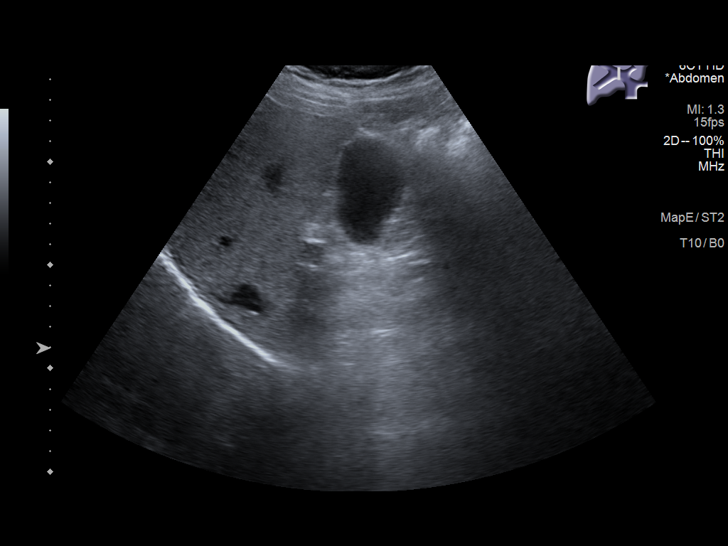
[im 30/79]
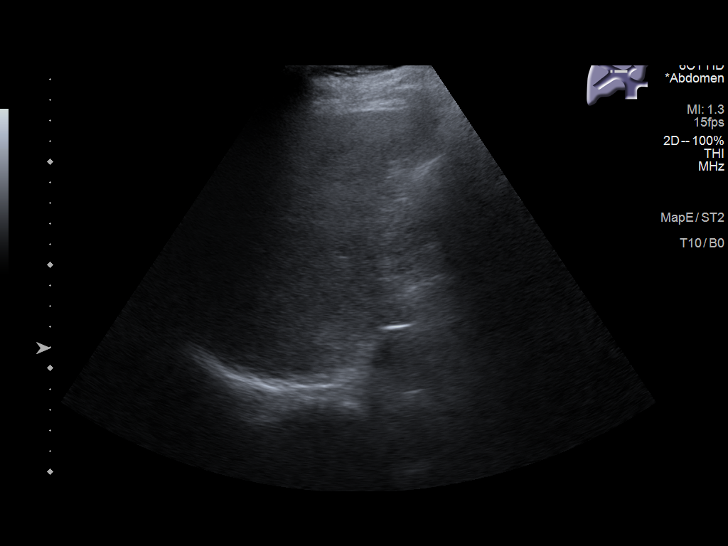
[im 36/79]
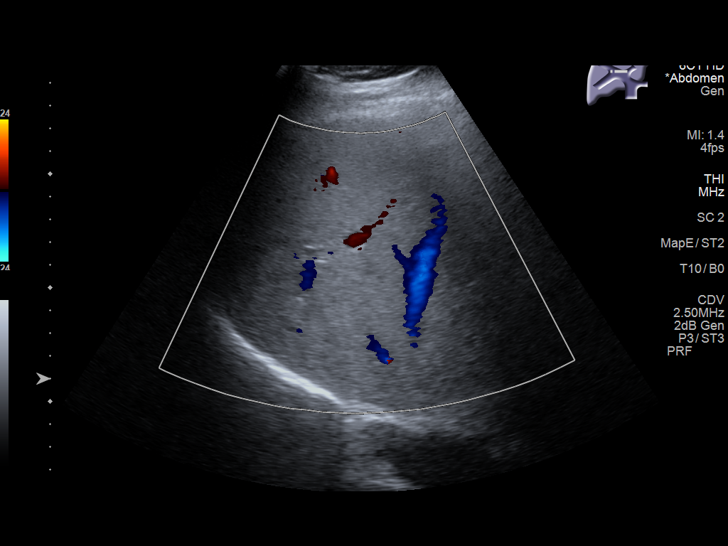
[im 43/79]
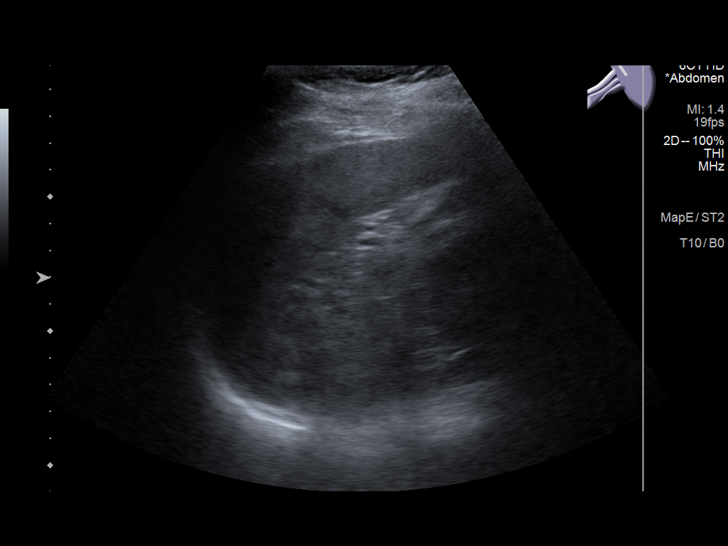
[im 49/79]
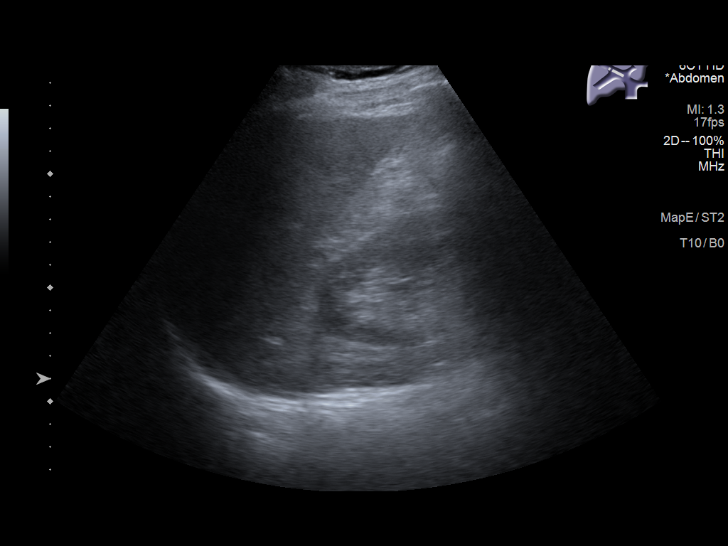
[im 53/79]
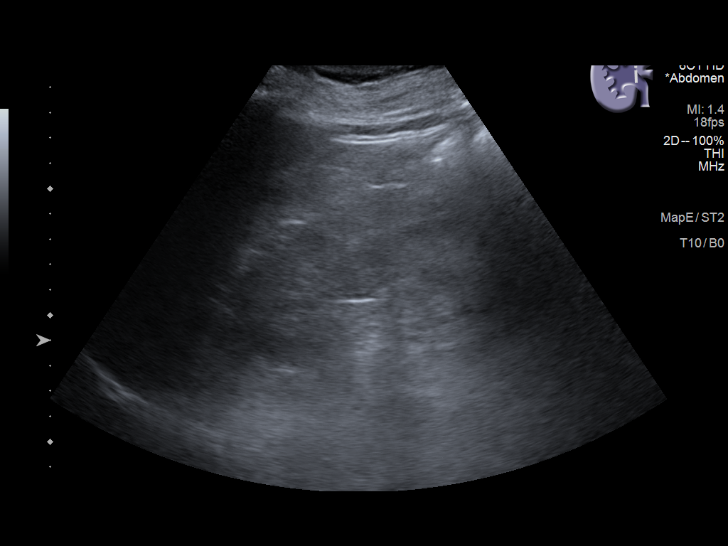
[im 59/79]
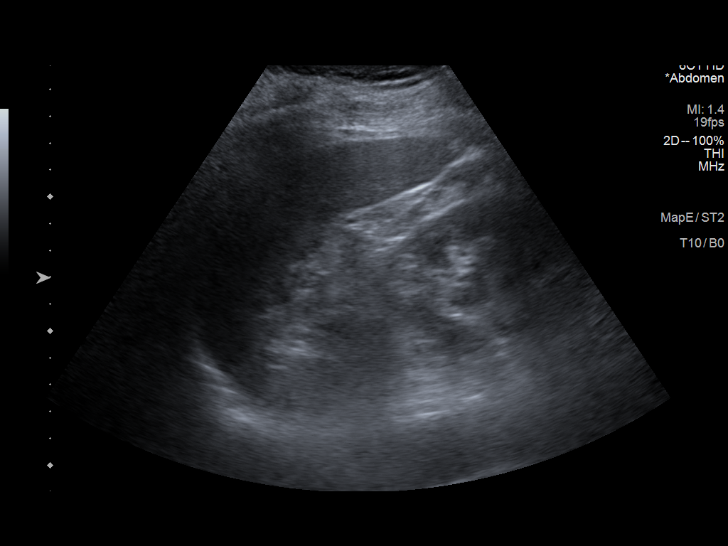
[im 66/79]
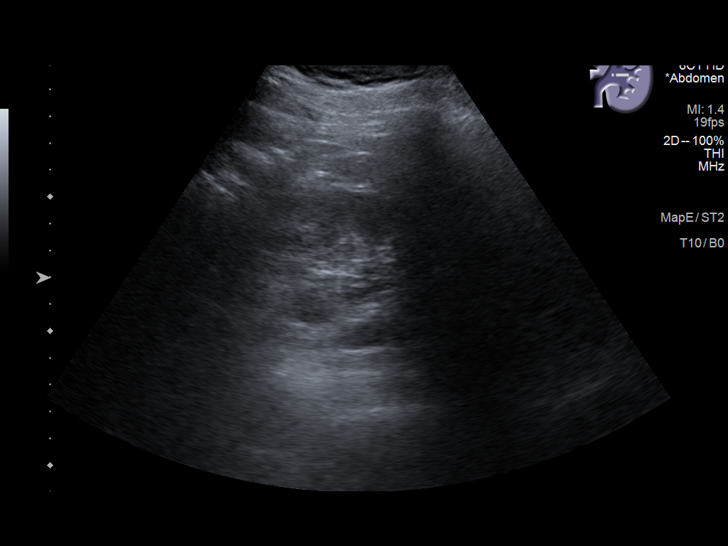
[im 72/79]
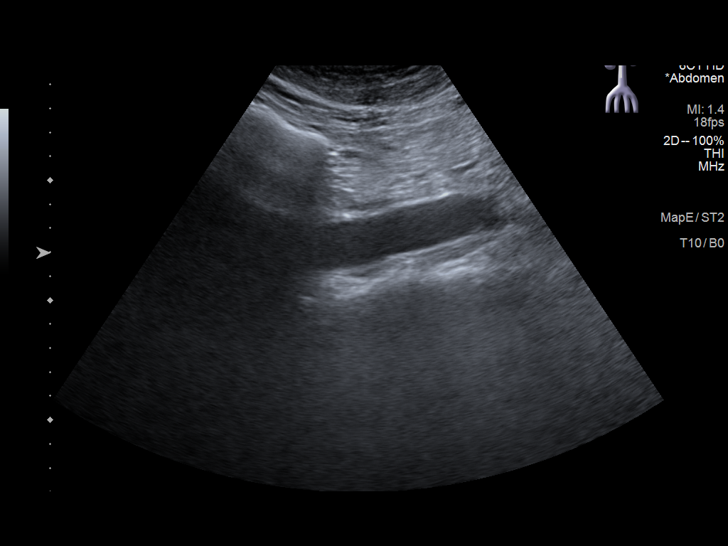
[im 79/79]
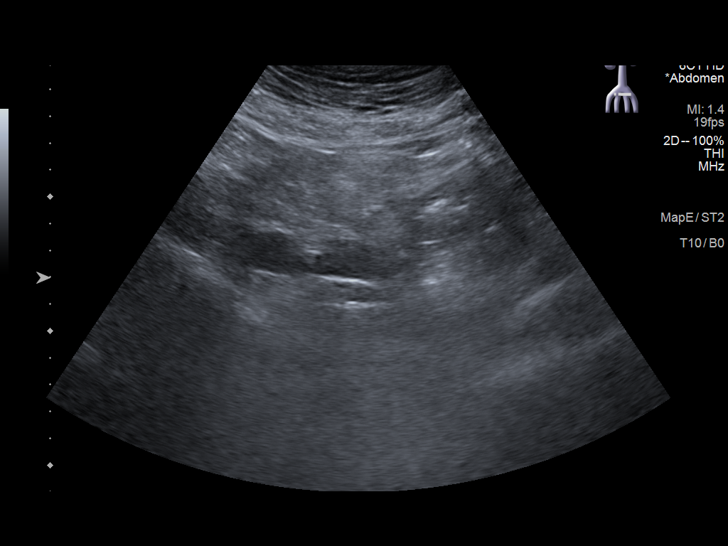

[14 of 25 positions shown; findings below may reference images not displayed]

FINDINGS: Gallbladder: Normally distended without stones or wall thickening.
No pericholecystic fluid or sonographic Murphy sign.

Common bile duct: Diameter: 3 mm, normal

Liver: Echogenic parenchyma, likely fatty infiltration though this
can be seen with cirrhosis and certain infiltrative disorders. No
focal hepatic mass or nodularity. Portal vein is patent on color
Doppler imaging with normal direction of blood flow towards the
liver.

IVC: Short segment of intrahepatic IVC unremarkable, remainder
obscured by bowel gas.

Pancreas: Fatty replacement.  No focal mass.

Spleen: Normal appearance, 9.8 cm length

Right Kidney: Length: 0.7 cm. Cortical thinning. Upper normal
cortical echogenicity. No mass or hydronephrosis.

Left Kidney: Length: 10.8 cm. Cortical thinning. Upper normal
cortical echogenicity. No mass or hydronephrosis.

Abdominal aorta: Normal caliber

Other findings: No free fluid
IMPRESSION: Probable fatty infiltration of liver as above.

No definite upper abdominal sonographic abnormalities identified.

## 2021-06-01 ENCOUNTER — Encounter: Payer: Medicare Other | Admitting: Physical Therapy

## 2021-06-02 ENCOUNTER — Ambulatory Visit: Payer: Medicare Other | Attending: Family Medicine | Admitting: Physical Therapy

## 2021-06-02 ENCOUNTER — Encounter: Payer: Self-pay | Admitting: Physical Therapy

## 2021-06-02 DIAGNOSIS — M546 Pain in thoracic spine: Secondary | ICD-10-CM | POA: Diagnosis not present

## 2021-06-02 DIAGNOSIS — G8929 Other chronic pain: Secondary | ICD-10-CM | POA: Insufficient documentation

## 2021-06-02 DIAGNOSIS — M545 Low back pain, unspecified: Secondary | ICD-10-CM | POA: Insufficient documentation

## 2021-06-02 DIAGNOSIS — R293 Abnormal posture: Secondary | ICD-10-CM | POA: Insufficient documentation

## 2021-06-02 NOTE — Therapy (Signed)
[place] [facility] [address], [place], [address] Phone: [phone]   Fax:  [phone]  Physical Therapy Treatment  Patient Details  Name: Daniel [name] MRN: [id number] Date of Birth: [birth year] Referring Provider (PT): Jerrol Banana., MD   Encounter Date: 06/02/2021   PT End of Session - 06/02/21 0905     Visit Number 23    Number of Visits 38    Date for PT Re-Evaluation 06/16/21    Authorization Type [employer] reporting period from 05/10/2021    Progress Note Due on Visit 30    PT Start Time 0900    PT Stop Time 0940    PT Time Calculation (min) 40 min    Activity Tolerance Patient tolerated treatment well    Behavior During Therapy [facility] for tasks assessed/performed             Past Medical History:  Diagnosis Date   Actinic keratosis    Asthma    Eczema    Hx of dysplastic nevus 06/06/2006   Mid lower back, slight atypia   Hypothyroidism    Seasonal allergies    Thyroid disease     Past Surgical History:  Procedure Laterality Date   CATARACT EXTRACTION     COLON SURGERY     COLONOSCOPY WITH PROPOFOL N/A 11/18/2014   Procedure: COLONOSCOPY WITH PROPOFOL;  Surgeon: [name], MD;  Location: [facility] ENDOSCOPY;  Service: Endoscopy;  Laterality: N/A;   EYE SURGERY     KNEE SURGERY     NASAL SEPTUM SURGERY      There were no vitals filed for this visit.   Subjective Assessment - 06/02/21 0903     Subjective Patient reports he is feeling well today. He awoke with back pain, currently feels stiff and feels he is a bit more stooped forward today. States he felt good after last PT session and did some work in the yard with less pain than usual while wearing his back brace. He states his HEP has been going well. He does them in the  mornings and before he goes to bed at night. States he is going back to the doctor on the 26th because he still had a couple of days where he felt  lousy/fatigued.    Pertinent History Patient is a 71 y.o. male who presents to outpatient physical therapy with a referral for medical diagnosis acute bilateral low back pain without sciatica. This patient's chief complaints consist of low back pain with radiation into R > L posterior glute and up to mid thoracic spine, leading to the following functional deficits: cooking for church, prolonged sitting, prolonged standing, yard work, Architectural technologist, sit <> stand, stairs, bending, lifting, staining deck, shopping, walking, social participation.    Relevant past medical history and comorbidities include asthma, Zenker's (hypopharyngeal) diverticulum, GERD, fatty liver, hypothyroidism, BPH, OA, hx adeomatous polyp of colon, obesity, inguinal hernia, hx of dysplastic nevus, colon surgery (7 inches removed of colon), cataract extraction.  Patient denies hx of cancer, stroke, seizures, lung problem, major cardiac events, diabetes, unexplained weight loss, changes in bowel or bladder problems, new onset stumbling or dropping things, spinal surgery.    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   How long can you stand comfortably? 1-1.5 hours    Diagnostic  tests Lumbar radiograph report 11/30/2020: "IMPRESSION:  1. Moderate multilevel lumbar spondylosis, progressed from prior.  2. Moderate-large volume of stool throughout the visualized colon."    Patient Stated Goals "to be able to stay on my feet as long as I need to without my back killing me"    Currently in Pain? No/denies                TREATMENT:    Therapeutic exercise: to centralize symptoms and improve ROM, strength, muscular endurance, and activity tolerance required for successful completion of functional activities.   - NuStep level 4 using bilateral upper and lower extremities. Seat/handle setting 12. For improved extremity mobility, muscular endurance, and activity tolerance; and to induce the analgesic effect of aerobic exercise, stimulate improved joint nutrition, and prepare body structures and systems for following interventions. x 5:018 minutes. Average SPM = 77. (Manual therapy - see below) - prone press up 2x10 - glute thrusts sitting on airex on floor with back on plinth with pillow under upper back to improve comfort. 3x10.  - seated thoracic extension over towel roll in chair, 3x20 - standing lower trap "Y" 3x10 with yellow theraband - Education on HEP including handout    Manual therapy: to reduce pain and tissue tension, improve range of motion, neuromodulation, in order to promote improved ability to complete functional activities. HOOKLYING  - PROM hip flexor stretch in Gaenslen's position, 3x30 seconds each side.  - flexion rotation stretch, 2x30 seconds followed by 20 gentle pulses each side (pain on contralateral side R > L). Painful to stand up but no worse with walking after. Felt slightly weak in the R knee when he first stood up.  PRONE - CPA grade III-IV with KE wedge from T5-L5, 1x40  reps/segment for improve joint motion and joint nutrition improved ranges of mobility.    Pt required multimodal cuing for proper technique and to facilitate improved neuromuscular control, strength, range of motion, and functional ability resulting in improved performance and form.   HOME EXERCISE PROGRAM Access Code: [email or web address] URL: [email or web address] Date: 06/02/2021 Prepared by: Rosita Kea  Exercises Sidelying Thoracic Rotation with Open Book - 1-2 x daily - 2 sets - 10 reps - 1 breath hold Quadruped Thoracic Rotation Full Range with Hand on Neck - 1 x daily - 2 sets - 10 reps Bird Dog - 1 x daily - 3 sets - 10 reps - 1-5 seconds hold Doorway Pec Stretch at 90 Degrees  Abduction - 1 x daily - 3 reps - 30 hold Standing Lumbar Extension at [place] - 4 x daily - 2 sets - 10 reps Standing Lumbar Extension with Counter - 4 x daily - 2 sets - 10 reps Supine Hip Extension on Bench - 1 x daily - 3 sets - 10 reps - 5 seconds hold Seated Thoracic Lumbar Extension with Pectoralis Stretch - 1-2 x daily - 3 sets - 20 reps    PT Education - 06/02/21 0905     Education Details form/technique with exercises    Person(s) Educated Patient    Methods Explanation;Demonstration;Tactile cues;Verbal cues    Comprehension Verbalized understanding;Returned demonstration;Verbal cues required;Tactile cues required;Need further instruction              PT Short Term Goals - 01/19/21 1126       PT SHORT TERM GOAL #1   Title Be independent with initial home exercise program for self-management of symptoms.    Baseline initial HEP provided at IE (  12/29/2020);    Time 2    Period Weeks    Status Achieved    Target Date 01/13/21               PT Long Term Goals - 05/10/21 1725       PT LONG TERM GOAL #1   Title Be independent with a long-term home exercise program for self-management of symptoms.    Baseline Initial HEP provided at IE (12/29/2020); participating in appropriate HEP (02/22/2021); continues to participate in appropriate HEP with some exceptions when busy (03/24/2021); Patient reports participating well (05/10/2021);    Time 12    Period Weeks    Status Partially Met   TARGET DATE FOR ALL LONG TERM GOALS: 03/23/2021. Updated to 06/16/2021 for unmet goals.     PT LONG TERM GOAL #2   Title Demonstrate improved FOTO to equal or greater than 75 by visit #10 to demonstrate improvement in overall condition and self-reported functional ability.    Baseline 63 (12/29/2020); 56 (02/22/2021); 65 at visit #14 (03/24/2021); 58 at visit 20 (05/10/2021);    Time 12    Period Weeks    Status On-going      PT LONG TERM GOAL #3   Title Patient will improve lumbar  extension to equal or greater than 50% to improve upright posture for improved standing tolerance for cooking.    Baseline 25% (12/29/2020); 25% less pain (02/22/2021); Extension: 35% with pain across low back.(03/24/2021; 05/10/2021);    Time 12    Period Weeks    Status Partially Met      PT LONG TERM GOAL #4   Title Reduce pain with functional activities to equal or less than 1/10 to allow patient to complete usual activities including ADLs, IADLs, cooking, and social engagement with less difficulty.    Baseline 10/10 with prolonged standing (12/29/2020); 8/10 (02/22/2021); 6/10 while traveling (03/24/2021); 4/10 with prolonged standing (05/10/2021);    Time 12    Period Weeks    Status Partially Met      PT LONG TERM GOAL #5   Title Complete community, work and/or recreational activities without limitation due to current condition.    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, stairs, bending, lifting, staining deck, shopping, walking, social participation (12/29/2020); stairs improved, can stand a bit longer and walking is better (02/22/2021); reports improved tolerance for functional activities (03/24/2021); continues to have difficulty with prolonged standing and activities that require standing or upright posture in sitting - reports he feels 40-50% better since starting PT (05/10/2021);    Time 12    Period Weeks    Status Partially Met                   Plan - 06/02/21 0936     Clinical Impression Statement Patient tolerated treatment well overall and continues to show within visit improvements in posture but continues to have significant stiffness, pain, and sensation of weakness in legs that impairs his ability to complete functional activities at his prior level of function. Utilized flexion rotation lumbar stretch this session that produced pain but was  no worse after. Patient would benefit from continued management of limiting condition by skilled physical therapist to address remaining impairments and functional limitations to work towards stated goals and return to PLOF or maximal functional independence.    Personal Factors and Comorbidities Time since onset of injury/illness/exacerbation;Past/Current  Experience;Comorbidity 3+    Comorbidities Relevant past medical history and comorbidities include asthma, Zenker's (hypopharyngeal) diverticulum, GERD, fatty liver, hypothyroidism, BPH, OA, hx adeomatous polyp of colon, obesity, inguinal hernia, hx of dysplastic nevus, colon surgery (7 inches removed of colon), cataract extraction.    Examination-Activity Limitations Carry;Bend;Caring for Others;Lift;Stairs;Squat;Stand;Locomotion Level;Sit;Dressing;Transfers    Examination-Participation Restrictions Church;Community Activity;Meal Prep;Interpersonal Relationship;Yard Work    Stability/Clinical Decision Making Stable/Uncomplicated    Rehab Potential Good    PT Frequency 2x / week    PT Duration 12 weeks    PT Treatment/Interventions ADLs/Self Care Home Management;Aquatic Therapy;Cryotherapy;Moist Heat;Electrical Stimulation;Gait training;Stair training;Functional mobility training;Neuromuscular re-education;Therapeutic exercise;Therapeutic activities;Patient/family education;Manual techniques;Dry needling;Passive range of motion;Joint Manipulations;Spinal Manipulations    PT Next Visit Plan spinal mobility, postural and glute strengthening, functional strengthening, hip extension ROM    PT Home Exercise Plan Medbridge Access Code: [email or web address]    Consulted and Agree with Plan of Care Patient             Patient will benefit from skilled therapeutic intervention in order to improve the following deficits and impairments:  Improper body mechanics, Pain, Decreased mobility, Increased muscle spasms, Postural dysfunction, Decreased activity tolerance,  Decreased endurance, Decreased range of motion, Decreased strength, Hypomobility, Impaired perceived functional ability, Difficulty walking  Visit Diagnosis: Chronic bilateral low back pain, unspecified whether sciatica present  Pain in thoracic spine  Abnormal posture     Problem List Patient Active Problem List   Diagnosis Date Noted   Zenker's (hypopharyngeal) diverticulum 03/22/2018   Gastroesophageal reflux disease 03/22/2018   Fatty liver 10/09/2017   BPH (benign prostatic hyperplasia) 11/21/2014   Hypothyroidism 11/21/2014   Obesity 11/21/2014   Asthma 11/21/2014   Inguinal hernia 11/21/2014   OA (osteoarthritis) 11/21/2014   H/O adenomatous polyp of colon 10/14/2014    Everlean Alstrom. Graylon Good, PT, DPT 06/02/21, 9:44 AM   [place] [facility] [address] [place], [address] Phone: [phone]   Fax:  [phone]  Name: Daniel [name] MRN: [id number] Date of Birth: Sep 25, 1950

## 2021-06-06 ENCOUNTER — Ambulatory Visit: Payer: Medicare Other | Admitting: Physical Therapy

## 2021-06-06 ENCOUNTER — Encounter: Payer: Self-pay | Admitting: Physical Therapy

## 2021-06-06 DIAGNOSIS — M545 Low back pain, unspecified: Secondary | ICD-10-CM

## 2021-06-06 DIAGNOSIS — R293 Abnormal posture: Secondary | ICD-10-CM

## 2021-06-06 DIAGNOSIS — M546 Pain in thoracic spine: Secondary | ICD-10-CM

## 2021-06-06 DIAGNOSIS — G8929 Other chronic pain: Secondary | ICD-10-CM | POA: Diagnosis not present

## 2021-06-06 NOTE — Therapy (Signed)
[facility] [address], [place], [address] Phone: [phone]   Fax:  [phone]  Physical Therapy Treatment  Patient Details  Name: Daniel [name] MRN: [id number] Date of Birth: Dec 28, 1950 Referring Provider (PT): Jerrol Banana., MD   Encounter Date: 06/06/2021   PT End of Session - 06/06/21 0906     Visit Number 24    Number of Visits 38    Date for PT Re-Evaluation 06/16/21    Authorization Type [employer] reporting period from 05/10/2021    Progress Note Due on Visit 30    PT Start Time 0903    PT Stop Time 0941    PT Time Calculation (min) 38 min    Activity Tolerance Patient tolerated treatment well    Behavior During Therapy [facility] for tasks assessed/performed             Past Medical History:  Diagnosis Date   Actinic keratosis    Asthma    Eczema    Hx of dysplastic nevus 06/06/2006   Mid lower back, slight atypia   Hypothyroidism    Seasonal allergies    Thyroid disease     Past Surgical History:  Procedure Laterality Date   CATARACT EXTRACTION     COLON SURGERY     COLONOSCOPY WITH PROPOFOL N/A 11/18/2014   Procedure: COLONOSCOPY WITH PROPOFOL;  Surgeon: [name], MD;  Location: [facility] ENDOSCOPY;  Service: Endoscopy;  Laterality: N/A;   EYE SURGERY     KNEE SURGERY     NASAL SEPTUM SURGERY      There were no vitals filed for this visit.   Subjective Assessment - 06/06/21 0904     Subjective Patient reports he is feeling okay today. States he did pretty good over the weekend although he did not do a lot due to the weather. He awoke with some pain in his back up to 4/10 this morning and when he got out of the car to come into the clinic. He reports no pain upon arrival. He feels a little stiff this morning but not bad. He did not keep track of if he felt the new stretch at last PT session made any difference or not.    Pertinent History Patient is a 71 y.o. male who  presents to outpatient physical therapy with a referral for medical diagnosis acute bilateral low back pain without sciatica. This patient's chief complaints consist of low back pain with radiation into R > L posterior glute and up to mid thoracic spine, leading to the following functional deficits: cooking for church, prolonged sitting, prolonged standing, yard work, Architectural technologist, sit <> stand, stairs, bending, lifting, staining deck, shopping, walking, social participation.    Relevant past medical history and comorbidities include asthma, Zenker's (hypopharyngeal) diverticulum, GERD, fatty liver, hypothyroidism, BPH, OA, hx adeomatous polyp of colon, obesity, inguinal hernia, hx of dysplastic nevus, colon surgery (7 inches removed of colon), cataract extraction.  Patient denies hx of cancer, stroke, seizures, lung problem, major cardiac events, diabetes, unexplained weight loss, changes in bowel or bladder problems, new onset stumbling or dropping things, spinal surgery.    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   How long can you stand comfortably? 1-1.5 hours    Diagnostic tests Lumbar radiograph report 11/30/2020: "IMPRESSION:  1. Moderate multilevel lumbar spondylosis, progressed from prior.  2. Moderate-large volume of stool throughout the visualized colon."    Patient Stated Goals "to be able to stay on my feet as long as I need to without my back killing me"    Currently in Pain? No/denies                 TREATMENT:    Therapeutic exercise: to centralize symptoms and improve ROM, strength, muscular endurance, and activity tolerance required for successful completion of functional activities.  - NuStep level 6 using bilateral upper and lower extremities.  Seat/handle setting 12. For improved extremity mobility, muscular endurance, and activity tolerance; and to induce the analgesic effect of aerobic exercise, stimulate improved joint nutrition, and prepare body structures and systems for following interventions. x 5:04 minutes. Average SPM = 68. (Tough on shoulders).  (Manual therapy - see below) - prone press up 2x10 - Prone hip extension, 3x10 on each side (knee extended).  - glute thrusts sitting on airex on floor with back on plinth with pillow under upper back to improve comfort. 3x15.  - seated thoracic extension over towel roll in chair, 3x20 - standing lower trap "Y" 3x15 with yellow theraband    Manual therapy: to reduce pain and tissue tension, improve range of motion, neuromodulation, in order to promote improved ability to complete functional activities. HOOKLYING  - PROM hip flexor stretch in Gaenslen's position, 2x60 seconds each side.  - flexion rotation stretch, knees to left side only 1x20 seconds(discontinued due to R hip pain/discomfort).  - sidlying rotational gapping mobilization to lumbar spine, 3x30 seconds, grade IV mobilizations each side.  PRONE - CPA grade III-IV with KE wedge from T5-L5, 1x40  reps/segment for improve joint motion and joint nutrition improved ranges of mobility.    Pt required multimodal cuing for proper technique and to facilitate improved neuromuscular control, strength, range of motion, and functional ability resulting in improved performance and form.   HOME EXERCISE PROGRAM Access Code: [email or web address] URL: [email or web address] Date: 06/02/2021 Prepared by: Rosita Kea   Exercises Sidelying Thoracic Rotation with Open Book - 1-2 x daily - 2 sets - 10 reps - 1 breath hold Quadruped Thoracic Rotation Full Range with Hand on Neck - 1 x daily - 2 sets - 10 reps Bird Dog - 1 x daily - 3 sets - 10 reps - 1-5 seconds hold Doorway Pec Stretch at 90 Degrees Abduction - 1 x daily - 3 reps -  30 hold Standing Lumbar Extension at [place] - 4 x daily - 2 sets - 10 reps Standing Lumbar Extension with Counter - 4 x daily - 2 sets - 10 reps Supine Hip Extension on Bench - 1 x daily - 3 sets - 10 reps - 5 seconds hold Seated Thoracic Lumbar Extension with Pectoralis Stretch - 1-2 x daily - 3 sets - 20 reps       PT Education - 06/06/21 0906     Education Details form/technique with exercises    Person(s) Educated Patient    Methods Explanation;Demonstration;Tactile cues;Verbal cues    Comprehension Verbalized understanding;Returned demonstration;Verbal cues required;Tactile cues required;Need further instruction              PT Short Term Goals - 01/19/21 1126       PT SHORT TERM GOAL #1   Title Be independent with initial home exercise program for self-management of symptoms.    Baseline initial HEP provided at IE (12/29/2020);  Time 2    Period Weeks    Status Achieved    Target Date 01/13/21               PT Long Term Goals - 05/10/21 1725       PT LONG TERM GOAL #1   Title Be independent with a long-term home exercise program for self-management of symptoms.    Baseline Initial HEP provided at IE (12/29/2020); participating in appropriate HEP (02/22/2021); continues to participate in appropriate HEP with some exceptions when busy (03/24/2021); Patient reports participating well (05/10/2021);    Time 12    Period Weeks    Status Partially Met   TARGET DATE FOR ALL LONG TERM GOALS: 03/23/2021. Updated to 06/16/2021 for unmet goals.     PT LONG TERM GOAL #2   Title Demonstrate improved FOTO to equal or greater than 75 by visit #10 to demonstrate improvement in overall condition and self-reported functional ability.    Baseline 63 (12/29/2020); 56 (02/22/2021); 65 at visit #14 (03/24/2021); 58 at visit 20 (05/10/2021);    Time 12    Period Weeks    Status On-going      PT LONG TERM GOAL #3   Title Patient will improve lumbar extension to equal or  greater than 50% to improve upright posture for improved standing tolerance for cooking.    Baseline 25% (12/29/2020); 25% less pain (02/22/2021); Extension: 35% with pain across low back.(03/24/2021; 05/10/2021);    Time 12    Period Weeks    Status Partially Met      PT LONG TERM GOAL #4   Title Reduce pain with functional activities to equal or less than 1/10 to allow patient to complete usual activities including ADLs, IADLs, cooking, and social engagement with less difficulty.    Baseline 10/10 with prolonged standing (12/29/2020); 8/10 (02/22/2021); 6/10 while traveling (03/24/2021); 4/10 with prolonged standing (05/10/2021);    Time 12    Period Weeks    Status Partially Met      PT LONG TERM GOAL #5   Title Complete community, work and/or recreational activities without limitation due to current condition.    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, stairs, bending, lifting, staining deck, shopping, walking, social participation (12/29/2020); stairs improved, can stand a bit longer and walking is better (02/22/2021); reports improved tolerance for functional activities (03/24/2021); continues to have difficulty with prolonged standing and activities that require standing or upright posture in sitting - reports he feels 40-50% better since starting PT (05/10/2021);    Time 12    Period Weeks    Status Partially Met                   Plan - 06/06/21 0908     Clinical Impression Statement Patient tolerated treatment well overall and continues to report within session improvements in spinal flexibility and posture. Continued working on postural strengthening and joint mobility for more upright posture. Patient would benefit from continued management of limiting condition by skilled physical therapist to address remaining impairments and functional limitations to  work towards stated goals and return to PLOF or maximal functional independence.    Personal Factors and Comorbidities Time since onset of injury/illness/exacerbation;Past/Current Experience;Comorbidity 3+    Comorbidities Relevant past medical history and comorbidities include asthma, Zenker's (hypopharyngeal) diverticulum, GERD, fatty liver, hypothyroidism, BPH, OA, hx adeomatous polyp of colon, obesity, inguinal hernia,  hx of dysplastic nevus, colon surgery (7 inches removed of colon), cataract extraction.    Examination-Activity Limitations Carry;Bend;Caring for Others;Lift;Stairs;Squat;Stand;Locomotion Level;Sit;Dressing;Transfers    Examination-Participation Restrictions Church;Community Activity;Meal Prep;Interpersonal Relationship;Yard Work    Stability/Clinical Decision Making Stable/Uncomplicated    Rehab Potential Good    PT Frequency 2x / week    PT Duration 12 weeks    PT Treatment/Interventions ADLs/Self Care Home Management;Aquatic Therapy;Cryotherapy;Moist Heat;Electrical Stimulation;Gait training;Stair training;Functional mobility training;Neuromuscular re-education;Therapeutic exercise;Therapeutic activities;Patient/family education;Manual techniques;Dry needling;Passive range of motion;Joint Manipulations;Spinal Manipulations    PT Next Visit Plan spinal mobility, postural and glute strengthening, functional strengthening, hip extension ROM    PT Home Exercise Plan Medbridge Access Code: [email or web address]    Consulted and Agree with Plan of Care Patient             Patient will benefit from skilled therapeutic intervention in order to improve the following deficits and impairments:  Improper body mechanics, Pain, Decreased mobility, Increased muscle spasms, Postural dysfunction, Decreased activity tolerance, Decreased endurance, Decreased range of motion, Decreased strength, Hypomobility, Impaired perceived functional ability, Difficulty walking  Visit Diagnosis: Chronic bilateral  low back pain, unspecified whether sciatica present  Pain in thoracic spine  Abnormal posture     Problem List Patient Active Problem List   Diagnosis Date Noted   Zenker's (hypopharyngeal) diverticulum 03/22/2018   Gastroesophageal reflux disease 03/22/2018   Fatty liver 10/09/2017   BPH (benign prostatic hyperplasia) 11/21/2014   Hypothyroidism 11/21/2014   Obesity 11/21/2014   Asthma 11/21/2014   Inguinal hernia 11/21/2014   OA (osteoarthritis) 11/21/2014   H/O adenomatous polyp of colon 10/14/2014    Everlean Alstrom. Graylon Good, PT, DPT 06/06/21, 9:44 AM   [facility] [address] [place], [address] Phone: [phone]   Fax:  [phone]  Name: Daniel [name] MRN: [id number] Date of Birth: 04/20/1951

## 2021-06-08 ENCOUNTER — Encounter: Payer: Self-pay | Admitting: Physical Therapy

## 2021-06-08 ENCOUNTER — Ambulatory Visit: Payer: Medicare Other | Admitting: Physical Therapy

## 2021-06-08 DIAGNOSIS — M545 Low back pain, unspecified: Secondary | ICD-10-CM | POA: Diagnosis not present

## 2021-06-08 DIAGNOSIS — M546 Pain in thoracic spine: Secondary | ICD-10-CM

## 2021-06-08 DIAGNOSIS — R293 Abnormal posture: Secondary | ICD-10-CM

## 2021-06-08 DIAGNOSIS — G8929 Other chronic pain: Secondary | ICD-10-CM

## 2021-06-08 NOTE — Therapy (Addendum)
[facility] [address], [place], [address] Phone: [phone]   Fax:  [phone]  Physical Therapy Treatment  Patient Details  Name: Daniel [name] MRN: [id number] Date of Birth: February 28, 1951 Referring Provider (PT): Jerrol Banana., MD   Encounter Date: 06/08/2021   PT End of Session - 06/08/21 0948     Visit Number 25    Number of Visits 38    Date for PT Re-Evaluation 06/16/21    Authorization Type [employer] reporting period from 05/10/2021    Progress Note Due on Visit 30    PT Start Time 0903    PT Stop Time 0947    PT Time Calculation (min) 44 min    Activity Tolerance Patient tolerated treatment well    Behavior During Therapy [facility] for tasks assessed/performed             Past Medical History:  Diagnosis Date   Actinic keratosis    Asthma    Eczema    Hx of dysplastic nevus 06/06/2006   Mid lower back, slight atypia   Hypothyroidism    Seasonal allergies    Thyroid disease     Past Surgical History:  Procedure Laterality Date   CATARACT EXTRACTION     COLON SURGERY     COLONOSCOPY WITH PROPOFOL N/A 11/18/2014   Procedure: COLONOSCOPY WITH PROPOFOL;  Surgeon: [name], MD;  Location: [facility] ENDOSCOPY;  Service: Endoscopy;  Laterality: N/A;   EYE SURGERY     KNEE SURGERY     NASAL SEPTUM SURGERY      There were no vitals filed for this visit.   Subjective Assessment - 06/08/21 0914     Subjective Patient reports reports he is feeling odd today after having coffee without foot. He states he has been doing intermittant fasting for some time (lost ~ 30 lbs over the last year) but lately caffeine seems to be causing him to feel weird. States his back was sore in the morning and his back ashoulders and arms were sore after last PT session. No pain currently in the back. He reports feeling a little stiff this morning but did not feel like he was walking bent over and feels this  seems to be getting better.    Pertinent History Patient is a 71 y.o. male who presents to outpatient physical therapy with a referral for medical diagnosis acute bilateral low back pain without sciatica. This patient's chief complaints consist of low back pain with radiation into R > L posterior glute and up to mid thoracic spine, leading to the following functional deficits: cooking for church, prolonged sitting, prolonged standing, yard work, Architectural technologist, sit <> stand, stairs, bending, lifting, staining deck, shopping, walking, social participation.    Relevant past medical history and comorbidities include asthma, Zenker's (hypopharyngeal) diverticulum, GERD, fatty liver, hypothyroidism, BPH, OA, hx adeomatous polyp of colon, obesity, inguinal hernia, hx of dysplastic nevus, colon surgery (7 inches removed of colon), cataract extraction.  Patient denies hx of cancer, stroke, seizures, lung problem, major cardiac events, diabetes, unexplained weight loss, changes in bowel or bladder problems, new onset stumbling or dropping things, spinal surgery.    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   How long can you stand comfortably? 1-1.5 hours    Diagnostic tests  Lumbar radiograph report 11/30/2020: "IMPRESSION:  1. Moderate multilevel lumbar spondylosis, progressed from prior.  2. Moderate-large volume of stool throughout the visualized colon."    Patient Stated Goals "to be able to stay on my feet as long as I need to without my back killing me"    Currently in Pain? No/denies              TREATMENT:    Therapeutic exercise: to centralize symptoms and improve ROM, strength, muscular endurance, and activity tolerance required for successful completion of functional  activities.  - NuStep level 6 using bilateral upper and lower extremities. Seat/handle setting 12. For improved extremity mobility, muscular endurance, and activity tolerance; and to induce the analgesic effect of aerobic exercise, stimulate improved joint nutrition, and prepare body structures and systems for following interventions. x 5:04 minutes. Average SPM = 68. (Tough on shoulders).  (Manual therapy - see below) - prone press up 2x10 - glute thrusts sitting on airex on floor with back on plinth with pillow under upper back to improve comfort. 3x10.  - seated thoracic extension over towel roll in chair, 3x20 - standing lower trap "Y" 3x10 with yellow theraband   Manual therapy: to reduce pain and tissue tension, improve range of motion, neuromodulation, in order to promote improved ability to complete functional activities. HOOKLYING  - PROM hip flexor stretch in Gaenslen's position, 2x60 seconds each side.  - flexion rotation stretch, knees to left side only 1x20 seconds(discontinued due to R hip pain/discomfort).  - sidlying rotational gapping mobilization to lumbar spine, 3x30 seconds, grade IV mobilizations each side.  PRONE - CPA grade III-IV with KE wedge from T5-L5, 1x40  reps/segment for improve joint motion and joint nutrition improved ranges of mobility.    Pt required multimodal cuing for proper technique and to facilitate improved neuromuscular control, strength, range of motion, and functional ability resulting in improved performance and form.   HOME EXERCISE PROGRAM Access Code: [email or web address] URL: [email or web address] Date: 06/02/2021 Prepared by: Rosita Kea   Exercises Sidelying Thoracic Rotation with Open Book - 1-2 x daily - 2 sets - 10 reps - 1 breath hold Quadruped Thoracic Rotation Full Range with Hand on Neck - 1 x daily - 2 sets - 10 reps Bird Dog - 1 x daily - 3 sets - 10 reps - 1-5 seconds hold Doorway Pec Stretch at 90 Degrees Abduction - 1 x  daily - 3 reps - 30 hold Standing Lumbar Extension at Moodus - 4 x daily - 2 sets - 10 reps Standing Lumbar Extension with Counter - 4 x daily - 2 sets - 10 reps Supine Hip Extension on Bench - 1 x daily - 3 sets - 10 reps - 5 seconds hold Seated Thoracic Lumbar Extension with Pectoralis Stretch - 1-2 x daily - 3 sets - 20 reps     PT Education - 06/08/21 0947     Education Details form/technique with exercises    Person(s) Educated Patient    Methods Explanation;Demonstration;Tactile cues;Verbal cues    Comprehension Verbalized understanding;Returned demonstration;Verbal cues required;Tactile cues required;Need further instruction              PT Short Term Goals - 01/19/21 1126       PT SHORT TERM GOAL #1   Title Be independent with initial home exercise program for self-management of symptoms.    Baseline initial HEP provided at IE (12/29/2020);    Time 2    Period Weeks  Status Achieved    Target Date 01/13/21               PT Long Term Goals - 05/10/21 1725       PT LONG TERM GOAL #1   Title Be independent with a long-term home exercise program for self-management of symptoms.    Baseline Initial HEP provided at IE (12/29/2020); participating in appropriate HEP (02/22/2021); continues to participate in appropriate HEP with some exceptions when busy (03/24/2021); Patient reports participating well (05/10/2021);    Time 12    Period Weeks    Status Partially Met   TARGET DATE FOR ALL LONG TERM GOALS: 03/23/2021. Updated to 06/16/2021 for unmet goals.     PT LONG TERM GOAL #2   Title Demonstrate improved FOTO to equal or greater than 75 by visit #10 to demonstrate improvement in overall condition and self-reported functional ability.    Baseline 63 (12/29/2020); 56 (02/22/2021); 65 at visit #14 (03/24/2021); 58 at visit 20 (05/10/2021);    Time 12    Period Weeks    Status On-going      PT LONG TERM GOAL #3   Title Patient will improve lumbar extension to  equal or greater than 50% to improve upright posture for improved standing tolerance for cooking.    Baseline 25% (12/29/2020); 25% less pain (02/22/2021); Extension: 35% with pain across low back.(03/24/2021; 05/10/2021);    Time 12    Period Weeks    Status Partially Met      PT LONG TERM GOAL #4   Title Reduce pain with functional activities to equal or less than 1/10 to allow patient to complete usual activities including ADLs, IADLs, cooking, and social engagement with less difficulty.    Baseline 10/10 with prolonged standing (12/29/2020); 8/10 (02/22/2021); 6/10 while traveling (03/24/2021); 4/10 with prolonged standing (05/10/2021);    Time 12    Period Weeks    Status Partially Met      PT LONG TERM GOAL #5   Title Complete community, work and/or recreational activities without limitation due to current condition.    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, stairs, bending, lifting, staining deck, shopping, walking, social participation (12/29/2020); stairs improved, can stand a bit longer and walking is better (02/22/2021); reports improved tolerance for functional activities (03/24/2021); continues to have difficulty with prolonged standing and activities that require standing or upright posture in sitting - reports he feels 40-50% better since starting PT (05/10/2021);    Time 12    Period Weeks    Status Partially Met                   Plan - 06/08/21 0947     Clinical Impression Statement Patient tolerated treatment well overall and continues to report feeling more upright and that he is making progress because he can straighten his arms with the prone press up immediately. Decreased reps on overhead Y due to soreness in shoulders that affected sleep. Session continued to focus on improving postural strength/endurance and mobility. Patient would  benefit from continued management of limiting condition by skilled physical therapist to address remaining impairments and functional limitations to work towards stated goals and return to PLOF or maximal functional independence.    Personal Factors and Comorbidities Time since onset of injury/illness/exacerbation;Past/Current Experience;Comorbidity 3+    Comorbidities Relevant past medical history and comorbidities include asthma, Zenker's (hypopharyngeal) diverticulum,  GERD, fatty liver, hypothyroidism, BPH, OA, hx adeomatous polyp of colon, obesity, inguinal hernia, hx of dysplastic nevus, colon surgery (7 inches removed of colon), cataract extraction.    Examination-Activity Limitations Carry;Bend;Caring for Others;Lift;Stairs;Squat;Stand;Locomotion Level;Sit;Dressing;Transfers    Examination-Participation Restrictions Church;Community Activity;Meal Prep;Interpersonal Relationship;Yard Work    Stability/Clinical Decision Making Stable/Uncomplicated    Rehab Potential Good    PT Frequency 2x / week    PT Duration 12 weeks    PT Treatment/Interventions ADLs/Self Care Home Management;Aquatic Therapy;Cryotherapy;Moist Heat;Electrical Stimulation;Gait training;Stair training;Functional mobility training;Neuromuscular re-education;Therapeutic exercise;Therapeutic activities;Patient/family education;Manual techniques;Dry needling;Passive range of motion;Joint Manipulations;Spinal Manipulations    PT Next Visit Plan spinal mobility, postural and glute strengthening, functional strengthening, hip extension ROM    PT Home Exercise Plan Medbridge Access Code: [email or web address]    Consulted and Agree with Plan of Care Patient             Patient will benefit from skilled therapeutic intervention in order to improve the following deficits and impairments:  Improper body mechanics, Pain, Decreased mobility, Increased muscle spasms, Postural dysfunction, Decreased activity tolerance, Decreased endurance, Decreased  range of motion, Decreased strength, Hypomobility, Impaired perceived functional ability, Difficulty walking  Visit Diagnosis: Chronic bilateral low back pain, unspecified whether sciatica present  Pain in thoracic spine  Abnormal posture     Problem List Patient Active Problem List   Diagnosis Date Noted   Zenker's (hypopharyngeal) diverticulum 03/22/2018   Gastroesophageal reflux disease 03/22/2018   Fatty liver 10/09/2017   BPH (benign prostatic hyperplasia) 11/21/2014   Hypothyroidism 11/21/2014   Obesity 11/21/2014   Asthma 11/21/2014   Inguinal hernia 11/21/2014   OA (osteoarthritis) 11/21/2014   H/O adenomatous polyp of colon 10/14/2014    Everlean Alstrom. Graylon Good, PT, DPT 06/08/21, 9:58 AM   [facility] [address] [place], [address] Phone: [phone]   Fax:  [phone]  Name: Daniel [name] MRN: [id number] Date of Birth: 1951-01-10

## 2021-06-13 ENCOUNTER — Ambulatory Visit: Payer: Medicare Other | Admitting: Physical Therapy

## 2021-06-15 ENCOUNTER — Ambulatory Visit: Payer: Medicare Other | Admitting: Physical Therapy

## 2021-06-20 ENCOUNTER — Encounter: Payer: Self-pay | Admitting: Physical Therapy

## 2021-06-20 ENCOUNTER — Ambulatory Visit: Payer: Medicare Other | Admitting: Physical Therapy

## 2021-06-20 DIAGNOSIS — M545 Low back pain, unspecified: Secondary | ICD-10-CM

## 2021-06-20 DIAGNOSIS — R293 Abnormal posture: Secondary | ICD-10-CM | POA: Diagnosis not present

## 2021-06-20 DIAGNOSIS — G8929 Other chronic pain: Secondary | ICD-10-CM | POA: Diagnosis not present

## 2021-06-20 DIAGNOSIS — M546 Pain in thoracic spine: Secondary | ICD-10-CM

## 2021-06-20 NOTE — Therapy (Signed)
[facility] [address] [place], [address] Phone: [phone]   Fax:  [phone]  Physical Therapy Treatment / Progress Note / Re-Certification Dates of reporting 05/10/2021 to 06/20/2021  Patient Details  Name: RAMIL EDGINGTON MRN: [id number] Date of Birth: 04/14/1951 Referring Provider (PT): Jerrol Banana., MD   Encounter Date: 06/20/2021   PT End of Session - 06/20/21 0917     Visit Number 26    Number of Visits 40    Date for PT Re-Evaluation 09/12/21    Authorization Type [employer] reporting period from 05/10/2021    Progress Note Due on Visit 30    PT Start Time 0902    PT Stop Time 0942    PT Time Calculation (min) 40 min    Activity Tolerance Patient tolerated treatment well    Behavior During Therapy [facility] for tasks assessed/performed             Past Medical History:  Diagnosis Date   Actinic keratosis    Asthma    Eczema    Hx of dysplastic nevus 06/06/2006   Mid lower back, slight atypia   Hypothyroidism    Seasonal allergies    Thyroid disease     Past Surgical History:  Procedure Laterality Date   CATARACT EXTRACTION     COLON SURGERY     COLONOSCOPY WITH PROPOFOL N/A 11/18/2014   Procedure: COLONOSCOPY WITH PROPOFOL;  Surgeon: [name], MD;  Location: [facility] ENDOSCOPY;  Service: Endoscopy;  Laterality: N/A;   EYE SURGERY     KNEE SURGERY     NASAL SEPTUM SURGERY      There were no vitals filed for this visit.   Subjective Assessment - 06/20/21 0908     Subjective Patient reports he is feeling okay today. State his back has been doing well over the last week since he has been pacing himself more and stopping activity before his back hurts. He reports no pain currently and states he feels like he is standing up pretty straight. He reports he missed his two PT visits last week first because the clinic was closed due to a gas leak, and then because he felt too poorly  in general on Wednesday to come. He is going to see the orthopedic doctor this Thursday to discuss his back. States he has been doing his HEP but not walking due to the frequent rainy days. States he has some stiffness in his low back today ("more stiffness than pain"). Reports highest level of pain he has had was 4/10 when he felt pain all over on Saturday. One of his neighbors that he had been walking with died suddenly this weekend while he was walking, apparently due to heart problems. Patient thinks he needs at least 2 more visits of PT but he is feeling better.    Pertinent History Patient is a 71 y.o. male who presents to outpatient physical therapy with a referral for medical diagnosis acute bilateral low back pain without sciatica. This patient's chief complaints consist of low back pain with radiation into R > L posterior glute and up to mid thoracic spine, leading to the following functional deficits: cooking for church, prolonged sitting, prolonged standing, yard work, Architectural technologist, sit <> stand, stairs, bending, lifting, staining deck, shopping, walking, social participation.    Relevant past medical history and comorbidities include asthma, Zenker's (hypopharyngeal) diverticulum, GERD, fatty liver, hypothyroidism, BPH, OA, hx adeomatous  polyp of colon, obesity, inguinal hernia, hx of dysplastic nevus, colon surgery (7 inches removed of colon), cataract extraction.  Patient denies hx of cancer, stroke, seizures, lung problem, major cardiac events, diabetes, unexplained weight loss, changes in bowel or bladder problems, new onset stumbling or dropping things, spinal surgery.    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   How long can you stand comfortably? 1-1.5 hours     Diagnostic tests Lumbar radiograph report 11/30/2020: "IMPRESSION:  1. Moderate multilevel lumbar spondylosis, progressed from prior.  2. Moderate-large volume of stool throughout the visualized colon."    Patient Stated Goals "to be able to stay on my feet as long as I need to without my back killing me"    Currently in Pain? No/denies    Effect of Pain on Daily Activities cooking for church/events, anything standing for over 20 mins. yard work, Architectural technologist, putting out some [employer] - bothered him some, walking with back belt, bending, lifting. Better: prolonged sitting (1.5 hours typing for meetings or riding in a car - stiff after), sit <> stand (no longer bothers him). Had no trouble putting his christmas decorations back. Continues to use back belt.                OBJECTIVE   SELF-REPORTED FUNCTION FOTO score: 73/100 (lumbar spine questionnaire)   SPINE MOTION Lumbar AROM *Indicates pain Flexion: = fingers to distal shin (normal range for him, end range pain at low back. Feels more stiff). Extension: = 25%, mild feeling of legs in weakness, a little ashiness after straightening up Rotation: R = WFL, L = WFL Side Flexion: R = 25% R sided low back pain and pain by R scapula, L = 25% pain in lateral left thigh  FUNCTIONAL TESTS - 6 Minute Walk Test: 1404 feet no assistive device, mildly stooped posture, no increase in pain/stiffness.     TREATMENT:    Therapeutic exercise: to centralize symptoms and improve ROM, strength, muscular endurance, and activity tolerance required for successful completion of functional activities.  - ambulation around clinic for distance in 6 minutes (see 6 min walk test above) to assess function and warm up tissues to prepare for remaining interventions.  - AROM Lumbar spine measurements to assess progress.  (Manual therapy - see below) - education on progress, POC   Manual therapy: to reduce pain and tissue tension,  improve range of motion, neuromodulation, in order to promote improved ability to complete functional activities. HOOKLYING  - PROM hip flexor stretch in Gaenslen's position, 2x60 seconds each side.  - sidlying rotational gapping mobilization to lumbar spine, 3x30 seconds, grade IV mobilizations each side.    Pt required multimodal cuing for proper technique and to facilitate improved neuromuscular control, strength, range of motion, and functional ability resulting in improved performance and form.   HOME EXERCISE PROGRAM Access Code: [email or web address] URL: [email or web address] Date: 06/02/2021 Prepared by: Rosita Kea   Exercises Sidelying Thoracic Rotation with Open Book - 1-2 x daily - 2 sets - 10 reps - 1 breath hold Quadruped Thoracic Rotation Full Range with Hand on Neck - 1 x daily - 2 sets - 10 reps Bird Dog - 1 x daily - 3 sets - 10 reps - 1-5 seconds hold Doorway Pec Stretch at 90 Degrees Abduction - 1 x daily - 3 reps -  30 hold Standing Lumbar Extension at [place] - 4 x daily - 2 sets - 10 reps Standing Lumbar Extension with Counter - 4 x daily - 2 sets - 10 reps Supine Hip Extension on Bench - 1 x daily - 3 sets - 10 reps - 5 seconds hold Seated Thoracic Lumbar Extension with Pectoralis Stretch - 1-2 x daily - 3 sets - 20 reps    PT Education - 06/20/21 0916     Education Details form/technique with exercises, progress, POC    Person(s) Educated Patient    Methods Explanation;Demonstration;Tactile cues;Verbal cues    Comprehension Verbalized understanding;Returned demonstration;Verbal cues required;Tactile cues required;Need further instruction              PT Short Term Goals - 01/19/21 1126       PT SHORT TERM GOAL #1   Title Be independent with initial home exercise program for self-management of symptoms.    Baseline initial HEP provided at IE (12/29/2020);    Time 2    Period Weeks    Status Achieved    Target Date 01/13/21                PT Long Term Goals - 06/20/21 1007       PT LONG TERM GOAL #1   Title Be independent with a long-term home exercise program for self-management of symptoms.    Baseline Initial HEP provided at IE (12/29/2020); participating in appropriate HEP (02/22/2021); continues to participate in appropriate HEP with some exceptions when busy (03/24/2021); Patient reports participating well (05/10/2021); patient reports good participation except walking due to weather (06/20/2021);    Time 12    Period Weeks    Status Partially Met   TARGET DATE FOR ALL LONG TERM GOALS: 03/23/2021. Updated to 06/16/2021 for unmet goals. Updated to 09/12/2021 for unmet goals.     PT LONG TERM GOAL #2   Title Demonstrate improved FOTO to equal or greater than 75 by visit #10 to demonstrate improvement in overall condition and self-reported functional ability.    Baseline 63 (12/29/2020); 56 (02/22/2021); 65 at visit #14 (03/24/2021); 58 at visit 20 (05/10/2021); 73 (06/20/2021);    Time 12    Period Weeks    Status On-going      PT LONG TERM GOAL #3   Title Patient will improve lumbar extension to equal or greater than 50% to improve upright posture for improved standing tolerance for cooking.    Baseline 25% (12/29/2020); 25% less pain (02/22/2021); Extension: 35% with pain across low back.(03/24/2021; 05/10/2021); 25% with pain across back upon return (06/20/21);    Time 12    Period Weeks    Status On-going      PT LONG TERM GOAL #4   Title Reduce pain with functional activities to equal or less than 1/10 to allow patient to complete usual activities including ADLs, IADLs, cooking, and social engagement with less difficulty.    Baseline 10/10 with prolonged standing (12/29/2020); 8/10 (02/22/2021); 6/10 while traveling (03/24/2021); 4/10 with prolonged standing (05/10/2021); 4/10 (06/20/2021);    Time 12    Period Weeks    Status Partially Met      PT LONG TERM GOAL #5   Title Complete community, work and/or recreational  activities without limitation due to current condition.    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, stairs, bending, lifting, staining deck, shopping,  walking, social participation (12/29/2020); stairs improved, can stand a bit longer and walking is better (02/22/2021); reports improved tolerance for functional activities (03/24/2021); continues to have difficulty with prolonged standing and activities that require standing or upright posture in sitting - reports he feels 40-50% better since starting PT (05/10/2021); reports improved ability to put christmas decorations back (06/20/2021);    Time 12    Period Weeks    Status Partially Met                   Plan - 06/20/21 1005     Clinical Impression Statement Patient has attended 26 physical therapy sessions since starting this episode of care on 12/29/2020. Since his last progress note on 05/10/2021 patient has made significant improvement in FOTO score (from 58/100 to 73/100), nearly meeting his goal of 74/100. Patient also reports improved functional ability and better pain control. However, he demonstrates minimal changes in lumbar AROM and continues to have limitations in functional activities that require prolonged standing, bending, or lifting, and has had pain as high as 4/10 over the last two weeks. Patient does report participating in appropriate HEP except has not been able to walk as frequently as planned due to the weather. Patient does not yet feel confident in maintaining his improvements independently and would benefit from a few more visits to demonstrate stability in improvement and finalize long term HEP. He also appears to have improved more after rotational manual techniques were introduced and would benefit from continuation of this technique for a few more visits. Patient would benefit from continued  management of limiting condition by skilled physical therapist to address remaining impairments and functional limitations to work towards stated goals and return to PLOF or maximal functional independence.    Personal Factors and Comorbidities Time since onset of injury/illness/exacerbation;Past/Current Experience;Comorbidity 3+    Comorbidities Relevant past medical history and comorbidities include asthma, Zenker's (hypopharyngeal) diverticulum, GERD, fatty liver, hypothyroidism, BPH, OA, hx adeomatous polyp of colon, obesity, inguinal hernia, hx of dysplastic nevus, colon surgery (7 inches removed of colon), cataract extraction.    Examination-Activity Limitations Carry;Bend;Caring for Others;Lift;Stairs;Squat;Stand;Locomotion Level;Sit;Dressing;Transfers    Examination-Participation Restrictions Church;Community Activity;Meal Prep;Interpersonal Relationship;Yard Work    Stability/Clinical Decision Making Stable/Uncomplicated    Rehab Potential Good    PT Frequency 2x / week    PT Duration 12 weeks    PT Treatment/Interventions ADLs/Self Care Home Management;Aquatic Therapy;Cryotherapy;Moist Heat;Electrical Stimulation;Gait training;Stair training;Functional mobility training;Neuromuscular re-education;Therapeutic exercise;Therapeutic activities;Patient/family education;Manual techniques;Dry needling;Passive range of motion;Joint Manipulations;Spinal Manipulations    PT Next Visit Plan spinal mobility, postural and glute strengthening, functional strengthening, transition to more comprehensive long term HEP    PT Home Exercise Plan Medbridge Access Code: [email or web address]    Consulted and Agree with Plan of Care Patient             Patient will benefit from skilled therapeutic intervention in order to improve the following deficits and impairments:  Improper body mechanics, Pain, Decreased mobility, Increased muscle spasms, Postural dysfunction, Decreased activity tolerance, Decreased endurance,  Decreased range of motion, Decreased strength, Hypomobility, Impaired perceived functional ability, Difficulty walking  Visit Diagnosis: Chronic bilateral low back pain, unspecified whether sciatica present  Pain in thoracic spine  Abnormal posture     Problem List Patient Active Problem List   Diagnosis Date Noted   Zenker's (hypopharyngeal) diverticulum 03/22/2018   Gastroesophageal reflux disease 03/22/2018   Fatty liver 10/09/2017   BPH (benign prostatic hyperplasia) 11/21/2014   Hypothyroidism 11/21/2014   Obesity  11/21/2014   Asthma 11/21/2014   Inguinal hernia 11/21/2014   OA (osteoarthritis) 11/21/2014   H/O adenomatous polyp of colon 10/14/2014    Everlean Alstrom. Graylon Good, PT, DPT 06/20/21, 10:09 AM   [facility] [address], [place], [address] Phone: [phone]   Fax:  [phone]  Name: DARWYN PONZO MRN: [id number] Date of Birth: 11-25-1950

## 2021-06-22 ENCOUNTER — Ambulatory Visit: Payer: Medicare Other | Admitting: Physical Therapy

## 2021-06-23 ENCOUNTER — Encounter: Payer: Self-pay | Admitting: Family Medicine

## 2021-06-23 ENCOUNTER — Inpatient Hospital Stay (INDEPENDENT_AMBULATORY_CARE_PROVIDER_SITE_OTHER): Payer: Medicare Other

## 2021-06-23 ENCOUNTER — Ambulatory Visit (INDEPENDENT_AMBULATORY_CARE_PROVIDER_SITE_OTHER): Payer: Medicare Other | Admitting: Family Medicine

## 2021-06-23 ENCOUNTER — Other Ambulatory Visit: Payer: Self-pay

## 2021-06-23 VITALS — BP 104/75 | HR 63 | Temp 97.2°F | Wt 212.0 lb

## 2021-06-23 DIAGNOSIS — R002 Palpitations: Secondary | ICD-10-CM | POA: Diagnosis not present

## 2021-06-23 DIAGNOSIS — R5382 Chronic fatigue, unspecified: Secondary | ICD-10-CM | POA: Diagnosis not present

## 2021-06-23 DIAGNOSIS — E559 Vitamin D deficiency, unspecified: Secondary | ICD-10-CM

## 2021-06-23 NOTE — Progress Notes (Signed)
Established patient visit   Patient: Daniel [name]   DOB: 1951/03/10   71 y.o. Male  MRN: [id number] Visit Date: 06/23/2021  Today's healthcare provider: Lavon Paganini, MD   Chief Complaint  Patient presents with   Fatigue   Subjective    HPI  Fatigue  He reports chronic fatigue which he describes as a lack of energy, feeling exhausted, and feeling weak. It began about a year ago. It is described as staying constant. He has started new medications around the time the fatigue started. Pt started pravastatin around the same time.  He states once he started taking pravastatin it caused muscle pain and fatigue.  After he stopped the muscle pain improved but the fatigue has not.   Started Vit D3 2000 units daily and hasn't noticed too much of a difference. Started back to walking.  Intermittent palpitations - seem to be random. Lasts 12-24 hours. No chest pain.  Associated symptoms: Yes arthralgias Yes bleeding  No melena No chest discomfort  No heart palpitations No heart racing  No dyspnea No feeling depressed  No feeling anxious or under stress No fevers  No loss of appetite No nausea  No vomiting No sleeping problems    Wt Readings from Last 3 Encounters:  06/23/21 212 lb (96.2 kg)  05/17/21 215 lb (97.5 kg)  02/16/21 214 lb (97.1 kg)    Lab Results  Component Value Date   WBC 6.2 05/17/2021   HGB 15.6 05/17/2021   HCT 46.5 05/17/2021   MCV 89 05/17/2021   PLT 213 05/17/2021   Lab Results  Component Value Date   TSH 2.300 05/17/2021   Lab Results  Component Value Date   NA 141 05/17/2021   K 4.3 05/17/2021   CO2 24 05/17/2021   BUN 19 05/17/2021   CREATININE 1.00 05/17/2021   CALCIUM 9.3 05/17/2021   GLUCOSE 102 (H) 05/17/2021     ---------------------------------------------------------------------------------------------------   Medications: Outpatient Medications Prior to Visit  Medication Sig   Famotidine (PEPCID PO) Take 1 tablet by  mouth daily.   levothyroxine (SYNTHROID) 100 MCG tablet TAKE 1 TABLET BY MOUTH EVERY DAY BEFORE BREAKFAST   Multiple Vitamin (MULTIVITAMIN) capsule Take 1 capsule daily by mouth.   pantoprazole (PROTONIX) 40 MG tablet TAKE 1 TABLET(40 MG) BY MOUTH TWICE DAILY   [DISCONTINUED] naproxen (NAPROSYN) 500 MG tablet Take 1 tablet (500 mg total) by mouth 2 (two) times daily as needed.   No facility-administered medications prior to visit.    Review of Systems  Respiratory: Negative.    Cardiovascular: Negative.   Gastrointestinal: Negative.  Blood in stool: Pt states he has had blood in his stool and has an appointment with GI in March..      Objective    BP 104/75 (BP Location: Right Arm, Patient Position: Sitting, Cuff Size: Large)    Pulse 63    Temp (!) 97.2 F (36.2 C) (Temporal)    Wt 212 lb (96.2 kg)    SpO2 98%    BMI 27.22 kg/m    Physical Exam Vitals reviewed.  Constitutional:      General: He is not in acute distress.    Appearance: Normal appearance. He is not diaphoretic.  HENT:     Head: Normocephalic and atraumatic.  Eyes:     General: No scleral icterus.    Conjunctiva/sclera: Conjunctivae normal.  Cardiovascular:     Rate and Rhythm: Normal rate and regular rhythm.  Pulses: Normal pulses.     Heart sounds: Normal heart sounds. No murmur heard. Pulmonary:     Effort: Pulmonary effort is normal. No respiratory distress.     Breath sounds: Normal breath sounds. No wheezing or rhonchi.  Abdominal:     General: There is no distension.     Palpations: Abdomen is soft.     Tenderness: There is no abdominal tenderness.  Musculoskeletal:     Cervical back: Neck supple.     Right lower leg: No edema.     Left lower leg: No edema.  Lymphadenopathy:     Cervical: No cervical adenopathy.  Skin:    General: Skin is warm and dry.     Capillary Refill: Capillary refill takes less than 2 seconds.     Findings: No rash.  Neurological:     Mental Status: He is alert and  oriented to person, place, and time.     Cranial Nerves: No cranial nerve deficit.  Psychiatric:        Mood and Affect: Mood normal.        Behavior: Behavior normal.      No results found for any visits on 06/23/21.  Assessment & Plan     1. Chronic fatigue -Longstanding - No significant changes and overall benign findings on labs 1 month ago with the exception of low vitamin D as below for which she is taking a supplement - Patient does report palpitations now, which she did not previously - Undiagnosed paroxysmal A. fib could contribute to chronic fatigue - See plan below  2. Palpitations -Newly reported problem, but longstanding and intermittent - EKG unchanged today - Zio patch will be sent to his home to be placed - Pending report, consider further evaluation/management - LONG TERM MONITOR (3-14 DAYS); Future - EKG 12-Lead  3. Avitaminosis D -Continue vitamin D supplement - Consider recheck at next visit   Return in about 2 months (around 08/21/2021) for CPE with PCP , as scheduled.       I,Laura E Walsh,acting as a Education administrator for Lavon Paganini, MD.,have documented all relevant documentation on the behalf of Lavon Paganini, MD,as directed by  Lavon Paganini, MD while in the presence of Lavon Paganini, MD.    I, Lavon Paganini, MD, have reviewed all documentation for this visit. The documentation on 06/23/21 for the exam, diagnosis, procedures, and orders are all accurate and complete.   Kammi Hechler, Dionne Bucy, MD, MPH [facility]

## 2021-06-24 DIAGNOSIS — M47816 Spondylosis without myelopathy or radiculopathy, lumbar region: Secondary | ICD-10-CM | POA: Diagnosis not present

## 2021-06-24 DIAGNOSIS — M545 Low back pain, unspecified: Secondary | ICD-10-CM | POA: Diagnosis not present

## 2021-06-27 ENCOUNTER — Encounter: Payer: Self-pay | Admitting: Family Medicine

## 2021-06-27 ENCOUNTER — Ambulatory Visit: Payer: Medicare Other | Admitting: Physical Therapy

## 2021-06-27 ENCOUNTER — Telehealth: Payer: Self-pay

## 2021-06-27 NOTE — Telephone Encounter (Signed)
Please see mychart message.

## 2021-06-27 NOTE — Telephone Encounter (Signed)
Copied from [facility] [id number]. Topic: General - Other >> Jun 27, 2021  2:03 PM Pawlus, Brayton Layman A wrote: Reason for CRM: Pt called in wanting a call back to go over his EKG results from 1/26, please call back.

## 2021-06-28 ENCOUNTER — Other Ambulatory Visit: Payer: Self-pay | Admitting: Orthopedic Surgery

## 2021-06-28 ENCOUNTER — Other Ambulatory Visit (HOSPITAL_COMMUNITY): Payer: Self-pay | Admitting: Orthopedic Surgery

## 2021-06-28 DIAGNOSIS — M47816 Spondylosis without myelopathy or radiculopathy, lumbar region: Secondary | ICD-10-CM

## 2021-06-29 ENCOUNTER — Encounter: Payer: Self-pay | Admitting: Physical Therapy

## 2021-06-29 ENCOUNTER — Ambulatory Visit: Payer: Medicare Other | Admitting: Physical Therapy

## 2021-06-29 ENCOUNTER — Telehealth: Payer: Self-pay | Admitting: Physical Therapy

## 2021-06-29 DIAGNOSIS — I480 Paroxysmal atrial fibrillation: Secondary | ICD-10-CM | POA: Diagnosis not present

## 2021-06-29 DIAGNOSIS — I499 Cardiac arrhythmia, unspecified: Secondary | ICD-10-CM | POA: Diagnosis not present

## 2021-06-29 DIAGNOSIS — G8929 Other chronic pain: Secondary | ICD-10-CM

## 2021-06-29 DIAGNOSIS — M546 Pain in thoracic spine: Secondary | ICD-10-CM

## 2021-06-29 DIAGNOSIS — Z86010 Personal history of colonic polyps: Secondary | ICD-10-CM | POA: Diagnosis not present

## 2021-06-29 DIAGNOSIS — R000 Tachycardia, unspecified: Secondary | ICD-10-CM | POA: Diagnosis not present

## 2021-06-29 DIAGNOSIS — K760 Fatty (change of) liver, not elsewhere classified: Secondary | ICD-10-CM | POA: Diagnosis not present

## 2021-06-29 DIAGNOSIS — R293 Abnormal posture: Secondary | ICD-10-CM

## 2021-06-29 DIAGNOSIS — K625 Hemorrhage of anus and rectum: Secondary | ICD-10-CM | POA: Diagnosis not present

## 2021-06-29 DIAGNOSIS — I959 Hypotension, unspecified: Secondary | ICD-10-CM | POA: Diagnosis not present

## 2021-06-29 NOTE — Therapy (Signed)
[facility] [address], [place], [address] Phone: [phone]   Fax:  [phone]  Physical Therapy No-Visit Discharge Summary Dates of reporting from 12/29/2020 to 06/29/2021  Patient Details  Name: Daniel [name] MRN: [id number] Date of Birth: 12-06-1950 Referring Provider (PT): Jerrol Banana., MD   Encounter Date: 06/29/2021    Past Medical History:  Diagnosis Date   Actinic keratosis    Asthma    Eczema    Hx of dysplastic nevus 06/06/2006   Mid lower back, slight atypia   Hypothyroidism    Seasonal allergies    Thyroid disease     Past Surgical History:  Procedure Laterality Date   CATARACT EXTRACTION     COLON SURGERY     COLONOSCOPY WITH PROPOFOL N/A 11/18/2014   Procedure: COLONOSCOPY WITH PROPOFOL;  Surgeon: [name], MD;  Location: [facility];  Service: Endoscopy;  Laterality: N/A;   EYE SURGERY     KNEE SURGERY     NASAL SEPTUM SURGERY      There were no vitals filed for this visit.   Subjective Assessment - 06/29/21 1616     Subjective Patient called today and requested discharge from PT after his back doctor ordered an MRI. He also states he is in afib and unable to come to his appointment originally scheduled for today. He is unsure if he will be sent back to PT in the future.    Pertinent History Patient is a 71 y.o. male who presents to outpatient physical therapy with a referral for medical diagnosis acute bilateral low back pain without sciatica. This patient's chief complaints consist of low back pain with radiation into R > L posterior glute and up to mid thoracic spine, leading to the following functional deficits: cooking for church, prolonged sitting, prolonged standing, yard work, Architectural technologist, sit <> stand, stairs, bending, lifting, staining deck, shopping, walking, social participation.    Relevant past medical history and comorbidities include  asthma, Zenker's (hypopharyngeal) diverticulum, GERD, fatty liver, hypothyroidism, BPH, OA, hx adeomatous polyp of colon, obesity, inguinal hernia, hx of dysplastic nevus, colon surgery (7 inches removed of colon), cataract extraction.  Patient denies hx of cancer, stroke, seizures, lung problem, major cardiac events, diabetes, unexplained weight loss, changes in bowel or bladder problems, new onset stumbling or dropping things, spinal surgery.    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   How long can you stand comfortably? 1-1.5 hours    Diagnostic tests Lumbar radiograph report 11/30/2020: "IMPRESSION:  1. Moderate multilevel lumbar spondylosis, progressed from prior.  2. Moderate-large volume of stool throughout the visualized colon."    Patient Stated Goals "to be able to stay on my feet as long as I need to without my back killing me"            OBJECTIVE Patient is not present for examination at this time. Please see previous documentation for latest objective data.     PT Short Term Goals - 01/19/21 1126       PT SHORT TERM GOAL #1   Title Be independent with initial home exercise program for self-management of symptoms.    Baseline initial HEP provided at IE (12/29/2020);    Time 2    Period Weeks    Status Achieved  Target Date 01/13/21               PT Long Term Goals - 06/29/21 1618       PT LONG TERM GOAL #1   Title Be independent with a long-term home exercise program for self-management of symptoms.    Baseline Initial HEP provided at IE (12/29/2020); participating in appropriate HEP (02/22/2021); continues to participate in appropriate HEP with some exceptions when busy (03/24/2021); Patient reports participating well (05/10/2021); patient reports  good participation except walking due to weather (06/20/2021);    Time 12    Period Weeks    Status Partially Met   TARGET DATE FOR ALL LONG TERM GOALS: 03/23/2021. Updated to 06/16/2021 for unmet goals. Updated to 09/12/2021 for unmet goals.     PT LONG TERM GOAL #2   Title Demonstrate improved FOTO to equal or greater than 75 by visit #10 to demonstrate improvement in overall condition and self-reported functional ability.    Baseline 63 (12/29/2020); 56 (02/22/2021); 65 at visit #14 (03/24/2021); 58 at visit 20 (05/10/2021); 73 (06/20/2021);    Time 12    Period Weeks    Status Partially Met      PT LONG TERM GOAL #3   Title Patient will improve lumbar extension to equal or greater than 50% to improve upright posture for improved standing tolerance for cooking.    Baseline 25% (12/29/2020); 25% less pain (02/22/2021); Extension: 35% with pain across low back.(03/24/2021; 05/10/2021); 25% with pain across back upon return (06/20/21);    Time 12    Period Weeks    Status Not Met      PT LONG TERM GOAL #4   Title Reduce pain with functional activities to equal or less than 1/10 to allow patient to complete usual activities including ADLs, IADLs, cooking, and social engagement with less difficulty.    Baseline 10/10 with prolonged standing (12/29/2020); 8/10 (02/22/2021); 6/10 while traveling (03/24/2021); 4/10 with prolonged standing (05/10/2021); 4/10 (06/20/2021);    Time 12    Period Weeks    Status Partially Met      PT LONG TERM GOAL #5   Title Complete community, work and/or recreational activities without limitation due to current condition.    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, stairs, bending, lifting, staining deck, shopping, walking, social participation (12/29/2020); stairs improved, can stand a bit longer and walking is better (02/22/2021); reports improved  tolerance for functional activities (03/24/2021); continues to have difficulty with prolonged standing and activities that require standing or upright posture in sitting - reports he feels 40-50% better since starting PT (05/10/2021); reports improved ability to put christmas decorations back (06/20/2021);    Time 12    Period Weeks    Status Partially Met               Plan - 06/29/21 1621     Clinical Impression Statement Patient has attended 26 physical therapy sessions since starting this episode of care on 12/29/2020. Since his last progress note on 05/10/2021 patient has made significant improvement in FOTO score (from 58/100 to 73/100), nearly meeting his goal of 74/100. Patient also reported improved functional ability and better pain control. However, he demonstrated minimal changes in lumbar AROM and continued to have limitations in functional activities that require prolonged standing, bending, or lifting, and has had pain as high as 4/10 over the last two weeks  which had not improved over the last few months. Patient reported participating in appropriate HEP except had not been able to walk as frequently as planned due to the weather. Patient was not yet confident in maintaining his improvements independently at his last session but he requested discharge today due to going into afib and after visiting his back doctor who has ordered an MRI. Patient has been provided with a long term HEP and is now discharged from PT.    Personal Factors and Comorbidities Time since onset of injury/illness/exacerbation;Past/Current Experience;Comorbidity 3+    Comorbidities Relevant past medical history and comorbidities include asthma, Zenker's (hypopharyngeal) diverticulum, GERD, fatty liver, hypothyroidism, BPH, OA, hx adeomatous polyp of colon, obesity, inguinal hernia, hx of dysplastic nevus, colon surgery (7 inches removed of colon), cataract extraction.    Examination-Activity Limitations  Carry;Bend;Caring for Others;Lift;Stairs;Squat;Stand;Locomotion Level;Sit;Dressing;Transfers    Examination-Participation Restrictions Church;Community Activity;Meal Prep;Interpersonal Relationship;Yard Work    Stability/Clinical Decision Making Stable/Uncomplicated    Rehab Potential Good    PT Frequency 2x / week    PT Duration 12 weeks    PT Treatment/Interventions ADLs/Self Care Home Management;Aquatic Therapy;Cryotherapy;Moist Heat;Electrical Stimulation;Gait training;Stair training;Functional mobility training;Neuromuscular re-education;Therapeutic exercise;Therapeutic activities;Patient/family education;Manual techniques;Dry needling;Passive range of motion;Joint Manipulations;Spinal Manipulations    PT Next Visit Plan patient is now discharged from [facility] Access Code: [place] and Agree with Plan of Care Patient             Patient will benefit from skilled therapeutic intervention in order to improve the following deficits and impairments:  Improper body mechanics, Pain, Decreased mobility, Increased muscle spasms, Postural dysfunction, Decreased activity tolerance, Decreased endurance, Decreased range of motion, Decreased strength, Hypomobility, Impaired perceived functional ability, Difficulty walking  Visit Diagnosis: Chronic bilateral low back pain, unspecified whether sciatica present  Pain in thoracic spine  Abnormal posture     Problem List Patient Active Problem List   Diagnosis Date Noted   Zenker's (hypopharyngeal) diverticulum 03/22/2018   Gastroesophageal reflux disease 03/22/2018   Fatty liver 10/09/2017   BPH (benign prostatic hyperplasia) 11/21/2014   Hypothyroidism 11/21/2014   Obesity 11/21/2014   Asthma 11/21/2014   Inguinal hernia 11/21/2014   OA (osteoarthritis) 11/21/2014   H/O adenomatous polyp of colon 10/14/2014    Everlean Alstrom. Graylon Good, PT, DPT 06/29/21, 4:22 PM   [facility] [address] [place], [address] Phone: [phone]   Fax:  [phone]  Name: Daniel [name] MRN: [id number] Date of Birth: 04/16/1951

## 2021-06-29 NOTE — Telephone Encounter (Signed)
Called patient back at his request. He reported that he cannot come to PT today because he is in afib and is currently working with his medical team on that. He also requested to cancel future PT appointments, stating an MRI has been ordered of his back. He states he is unsure if his doctor will send him back to PT in the future or not. Agreed with pt to discharge his PT case at this time.   Everlean Alstrom. Graylon Good, PT, DPT 06/29/21, 4:12 PM

## 2021-07-01 DIAGNOSIS — I480 Paroxysmal atrial fibrillation: Secondary | ICD-10-CM | POA: Diagnosis not present

## 2021-07-01 DIAGNOSIS — R9431 Abnormal electrocardiogram [ECG] [EKG]: Secondary | ICD-10-CM | POA: Diagnosis not present

## 2021-07-01 DIAGNOSIS — R0602 Shortness of breath: Secondary | ICD-10-CM | POA: Diagnosis not present

## 2021-07-04 ENCOUNTER — Encounter: Payer: Medicare Other | Admitting: Physical Therapy

## 2021-07-06 ENCOUNTER — Encounter: Payer: Medicare Other | Admitting: Physical Therapy

## 2021-07-09 ENCOUNTER — Ambulatory Visit: Payer: Medicare Other

## 2021-07-11 ENCOUNTER — Encounter: Payer: Medicare Other | Admitting: Physical Therapy

## 2021-07-13 ENCOUNTER — Encounter: Payer: Medicare Other | Admitting: Physical Therapy

## 2021-07-14 ENCOUNTER — Ambulatory Visit
Admission: RE | Admit: 2021-07-14 | Discharge: 2021-07-14 | Disposition: A | Discharge status: Home / Self Care | Payer: Medicare Other | Source: Ambulatory Visit | Attending: Orthopedic Surgery | Admitting: Orthopedic Surgery

## 2021-07-14 DIAGNOSIS — M47816 Spondylosis without myelopathy or radiculopathy, lumbar region: Secondary | ICD-10-CM | POA: Diagnosis not present

## 2021-07-14 DIAGNOSIS — M5126 Other intervertebral disc displacement, lumbar region: Secondary | ICD-10-CM | POA: Diagnosis not present

## 2021-07-14 DIAGNOSIS — M5136 Other intervertebral disc degeneration, lumbar region: Secondary | ICD-10-CM | POA: Diagnosis not present

## 2021-07-15 ENCOUNTER — Encounter: Payer: Self-pay | Admitting: Family Medicine

## 2021-07-18 ENCOUNTER — Encounter: Payer: Medicare Other | Admitting: Physical Therapy

## 2021-07-18 ENCOUNTER — Other Ambulatory Visit: Payer: Self-pay | Admitting: *Deleted

## 2021-07-18 DIAGNOSIS — M545 Low back pain, unspecified: Secondary | ICD-10-CM

## 2021-07-18 DIAGNOSIS — G8929 Other chronic pain: Secondary | ICD-10-CM

## 2021-07-18 MED ORDER — PREDNISONE 20 MG PO TABS: 20.0000 mg | ORAL_TABLET | Freq: Every day | ORAL | 0 refills | Status: DC

## 2021-07-19 DIAGNOSIS — R002 Palpitations: Secondary | ICD-10-CM | POA: Diagnosis not present

## 2021-07-20 ENCOUNTER — Encounter: Payer: Medicare Other | Admitting: Physical Therapy

## 2021-07-22 ENCOUNTER — Ambulatory Visit: Payer: Medicare Other | Admitting: Family Medicine

## 2021-07-25 ENCOUNTER — Encounter: Payer: Medicare Other | Admitting: Physical Therapy

## 2021-07-27 ENCOUNTER — Encounter: Payer: Medicare Other | Admitting: Physical Therapy

## 2021-07-27 DIAGNOSIS — I480 Paroxysmal atrial fibrillation: Secondary | ICD-10-CM | POA: Diagnosis not present

## 2021-07-27 DIAGNOSIS — R9431 Abnormal electrocardiogram [ECG] [EKG]: Secondary | ICD-10-CM | POA: Diagnosis not present

## 2021-07-27 DIAGNOSIS — R0602 Shortness of breath: Secondary | ICD-10-CM | POA: Diagnosis not present

## 2021-08-01 ENCOUNTER — Encounter: Payer: Medicare Other | Admitting: Physical Therapy

## 2021-08-01 DIAGNOSIS — M5416 Radiculopathy, lumbar region: Secondary | ICD-10-CM | POA: Diagnosis not present

## 2021-08-01 DIAGNOSIS — M48062 Spinal stenosis, lumbar region with neurogenic claudication: Secondary | ICD-10-CM | POA: Diagnosis not present

## 2021-08-03 ENCOUNTER — Encounter: Payer: Medicare Other | Admitting: Physical Therapy

## 2021-08-06 ENCOUNTER — Other Ambulatory Visit: Payer: Self-pay | Admitting: Family Medicine

## 2021-08-08 ENCOUNTER — Encounter: Payer: Medicare Other | Admitting: Physical Therapy

## 2021-08-10 ENCOUNTER — Encounter: Payer: Medicare Other | Admitting: Physical Therapy

## 2021-08-15 ENCOUNTER — Encounter: Payer: Medicare Other | Admitting: Family Medicine

## 2021-08-16 DIAGNOSIS — I480 Paroxysmal atrial fibrillation: Secondary | ICD-10-CM | POA: Diagnosis not present

## 2021-08-16 DIAGNOSIS — R001 Bradycardia, unspecified: Secondary | ICD-10-CM | POA: Diagnosis not present

## 2021-08-23 NOTE — Progress Notes (Signed)
? ? ? ?Annual Wellness Visit ? ?  ? ?Patient: Daniel [name], Male    DOB: January 25, 1951, 71 y.o.   MRN: [id number] ?Visit Date: 08/24/2021 ? ?Today's Provider: Wilhemena Durie, MD  ? ?Chief Complaint  ?Patient presents with  ? Annual Exam  ? ?Subjective  ?  ? ? ?HPI ?Daniel [name] is a 71 y.o. male who presents today for his Annual Wellness Visit. Annual Physical. ?He reports consuming a general diet. The patient does not participate in regular exercise at present. He generally feels fairly well. He reports sleeping well. He does not have additional problems to discuss today, patient reports that back pain has been improving but states that he still suffers with discomfort for prolonged period time of standing.  He has new A-fib which is being evaluated.  He has colonoscopy scheduled later this spring. ? ? ?Medications: ?Outpatient Medications Prior to Visit  ?Medication Sig  ? ELIQUIS 5 MG TABS tablet Take 5 mg by mouth 2 (two) times daily.  ? Famotidine (PEPCID PO) Take 1 tablet by mouth daily.  ? levothyroxine (SYNTHROID) 100 MCG tablet TAKE 1 TABLET BY MOUTH EVERY DAY BEFORE BREAKFAST  ? metoprolol succinate (TOPROL-XL) 25 MG 24 hr tablet Take 25 mg by mouth daily.  ? Multiple Vitamin (MULTIVITAMIN) capsule Take 1 capsule daily by mouth.  ? pantoprazole (PROTONIX) 40 MG tablet TAKE 1 TABLET(40 MG) BY MOUTH TWICE DAILY  ? [DISCONTINUED] predniSONE (DELTASONE) 20 MG tablet Take 1 tablet (20 mg total) by mouth daily with breakfast.  ? ?No facility-administered medications prior to visit.  ?  ?Allergies  ?Allergen Reactions  ? Ciprofloxacin Hcl Other (See Comments)  ?  Blisters break out  ? Levaquin [Levofloxacin In D5w]   ? Sulfa Antibiotics Other (See Comments)  ?  unknown  ? ? ?Patient Care Team: ?Jerrol Banana., MD as PCP - General (Family Medicine) ? ?Review of Systems  ?All other systems reviewed and are negative. ? ?Last thyroid functions ?Lab Results  ?Component Value Date  ? TSH 1.800 08/24/2021   ? T4TOTAL 7.3 05/27/2020  ? ?  ?  ? Objective  ?  ?Vitals: BP 127/83   Pulse (!) 54   Resp 15   Ht '6\' 2"'$  (1.88 m)   Wt 215 lb 8 oz (97.8 kg)   SpO2 99%   BMI 27.67 kg/m?  ?BP Readings from Last 3 Encounters:  ?08/24/21 127/83  ?06/23/21 104/75  ?05/17/21 108/80  ? ?Wt Readings from Last 3 Encounters:  ?08/24/21 215 lb 8 oz (97.8 kg)  ?06/23/21 212 lb (96.2 kg)  ?05/17/21 215 lb (97.5 kg)  ? ?  ? ? ?Physical Exam ?Vitals reviewed.  ?Constitutional:   ?   General: He is not in acute distress. ?   Appearance: He is well-developed.  ?HENT:  ?   Head: Normocephalic and atraumatic.  ?   Right Ear: Hearing normal.  ?   Left Ear: Hearing normal.  ?   Nose: Nose normal.  ?Eyes:  ?   General: Lids are normal. No scleral icterus.    ?   Right eye: No discharge.     ?   Left eye: No discharge.  ?   Conjunctiva/sclera: Conjunctivae normal.  ?Cardiovascular:  ?   Rate and Rhythm: Normal rate and regular rhythm.  ?   Heart sounds: Normal heart sounds.  ?Pulmonary:  ?   Effort: Pulmonary effort is normal. No respiratory distress.  ?Genitourinary: ?   Penis:  Normal.   ?   Testes: Normal.  ?Skin: ?   General: Skin is warm and dry.  ?   Findings: No lesion or rash.  ?Neurological:  ?   General: No focal deficit present.  ?   Mental Status: He is alert and oriented to person, place, and time.  ?Psychiatric:     ?   Mood and Affect: Mood normal.     ?   Speech: Speech normal.     ?   Behavior: Behavior normal.     ?   Thought Content: Thought content normal.     ?   Judgment: Judgment normal.  ? ? ? ?Most recent functional status assessment: ? ?  08/22/2021  ?  2:39 PM  ?In your present state of health, do you have any difficulty performing the following activities:  ?Hearing? 1  ?Vision? 0  ?Difficulty concentrating or making decisions? 0  ?Walking or climbing stairs? 0  ?Dressing or bathing? 0  ?Doing errands, shopping? 0  ?Preparing Food and eating ? N  ?Using the Toilet? N  ?Do you have problems with loss of bowel control? N   ?Managing your Medications? N  ?Managing your Finances? N  ?Housekeeping or managing your Housekeeping? N  ? ?Most recent fall risk assessment: ? ?  08/22/2021  ?  2:39 PM  ?Fall Risk   ?Falls in the past year? 0  ?Number falls in past yr: 0  ?Injury with Fall? 0  ? ? Most recent depression screenings: ? ?  06/23/2021  ?  8:51 AM 02/16/2021  ?  1:58 PM  ?PHQ 2/9 Scores  ?PHQ - 2 Score 0 0  ?PHQ- 9 Score 0 0  ? ?Most recent cognitive screening: ? ?  05/27/2019  ?  9:55 AM  ?6CIT Screen  ?What Year? 0 points  ?What month? 0 points  ?What time? 0 points  ?Count back from 20 0 points  ?Months in reverse 0 points  ?Repeat phrase 0 points  ?Total Score 0 points  ? ?Most recent Audit-C alcohol use screening ? ?  08/22/2021  ?  2:39 PM  ?Alcohol Use Disorder Test (AUDIT)  ?1. How often do you have a drink containing alcohol? 3  ?2. How many drinks containing alcohol do you have on a typical day when you are drinking? 0  ?3. How often do you have six or more drinks on one occasion? 0  ?AUDIT-C Score 3  ? ?A score of 3 or more in women, and 4 or more in men indicates increased risk for alcohol abuse, EXCEPT if all of the points are from question 1  ? ?No results found for any visits on 08/24/21. ? Assessment & Plan  ?  ? ?Annual wellness visit done today including the all of the following: ?Reviewed patient's Family Medical History ?Reviewed and updated list of patient's medical providers ?Assessment of cognitive impairment was done ?Assessed patient's functional ability ?Established a written schedule for health screening services ?Health Risk Assessent Completed and Reviewed ? ?Exercise Activities and Dietary recommendations ? Goals   ?None ?  ? ? ?Immunization History  ?Administered Date(s) Administered  ? Fluad Quad(high Dose 65+) 02/05/2019, 02/18/2020, 03/23/2021  ? Influenza, High Dose Seasonal PF 02/25/2015, 02/15/2017, 02/13/2018  ? Influenza, Seasonal, Injecte, Preservative Fre 06/26/2016  ? PFIZER(Purple  Top)SARS-COV-2 Vaccination 07/09/2019, 07/30/2019, 03/23/2020  ? Pneumococcal Conjugate-13 01/27/2015  ? Pneumococcal Polysaccharide-23 04/18/2018  ? Td 04/06/2020  ? Tdap 01/23/2007  ? Zoster Recombinat (Shingrix)  06/12/2018, 01/29/2020  ? Zoster, Live 05/31/2010  ? ? ?Health Maintenance  ?Topic Date Due  ? COVID-19 Vaccine (4 - Booster for [place] series) 05/18/2020  ? COLONOSCOPY (Pts 45-24yr Insurance coverage will need to be confirmed)  11/20/2022  ? TETANUS/TDAP  04/06/2030  ? Pneumonia Vaccine 72 Years old  Completed  ? INFLUENZA VACCINE  Completed  ? Hepatitis C Screening  Completed  ? Zoster Vaccines- Shingrix  Completed  ? HPV VACCINES  Aged Out  ? ? ? ?Discussed health benefits of physical activity, and encouraged him to engage in regular exercise appropriate for his age and condition.  ?  ?1. Encounter for Medicare annual wellness exam ?Patient has had both Shingrix ?- Lipid panel ?- TSH ?- CBC w/Diff/Platelet ?- Comprehensive Metabolic Panel (CMET) ?- Hemoglobin A1c ? ?2. Annual physical exam ? ? ?3. Hyperlipidemia, unspecified hyperlipidemia type ? ?- Lipid panel ?- TSH ?- CBC w/Diff/Platelet ?- Comprehensive Metabolic Panel (CMET) ?- Hemoglobin A1c ? ?4. Fatty liver ?Continue to work on diet and exercise. ?- Lipid panel ?- TSH ?- CBC w/Diff/Platelet ?- Comprehensive Metabolic Panel (CMET) ?- Hemoglobin A1c ? ?5. Other specified hypothyroidism ?Follow-up TSH to euthyroid ?- Lipid panel ?- TSH ?- CBC w/Diff/Platelet ?- Comprehensive Metabolic Panel (CMET) ?- Hemoglobin A1c ? ?6. Long-term use of high-risk medication ?Check labs on pantoprazole ?- Lipid panel ?- TSH ?- CBC w/Diff/Platelet ?- Comprehensive Metabolic Panel (CMET) ?- Hemoglobin A1c ? ?7. Class 1 obesity due to excess calories without serious comorbidity with body mass index (BMI) of 30.0 to 30.9 in adult ? ?- Lipid panel ?- TSH ?- CBC w/Diff/Platelet ?- Comprehensive Metabolic Panel (CMET) ?- Hemoglobin A1c ? ?8. Elevated glucose  level ?Follow-up A1c when appropriate. ?- Lipid panel ?- TSH ?- CBC w/Diff/Platelet ?- Comprehensive Metabolic Panel (CMET) ?- Hemoglobin A1c ? ?9. Acute bilateral low back pain without sciatica ?Followed by surgery ? ?10. Spondyl

## 2021-08-24 ENCOUNTER — Encounter: Payer: Self-pay | Admitting: Family Medicine

## 2021-08-24 ENCOUNTER — Other Ambulatory Visit: Payer: Self-pay

## 2021-08-24 ENCOUNTER — Ambulatory Visit (INDEPENDENT_AMBULATORY_CARE_PROVIDER_SITE_OTHER): Payer: Medicare Other | Admitting: Family Medicine

## 2021-08-24 VITALS — BP 127/83 | HR 54 | Resp 15 | Ht 74.0 in | Wt 215.5 lb

## 2021-08-24 DIAGNOSIS — R7309 Other abnormal glucose: Secondary | ICD-10-CM

## 2021-08-24 DIAGNOSIS — Z6830 Body mass index (BMI) 30.0-30.9, adult: Secondary | ICD-10-CM

## 2021-08-24 DIAGNOSIS — E785 Hyperlipidemia, unspecified: Secondary | ICD-10-CM

## 2021-08-24 DIAGNOSIS — M545 Low back pain, unspecified: Secondary | ICD-10-CM

## 2021-08-24 DIAGNOSIS — E038 Other specified hypothyroidism: Secondary | ICD-10-CM | POA: Diagnosis not present

## 2021-08-24 DIAGNOSIS — Z0000 Encounter for general adult medical examination without abnormal findings: Secondary | ICD-10-CM

## 2021-08-24 DIAGNOSIS — E6609 Other obesity due to excess calories: Secondary | ICD-10-CM

## 2021-08-24 DIAGNOSIS — Z79899 Other long term (current) drug therapy: Secondary | ICD-10-CM

## 2021-08-24 DIAGNOSIS — K760 Fatty (change of) liver, not elsewhere classified: Secondary | ICD-10-CM | POA: Diagnosis not present

## 2021-08-24 DIAGNOSIS — M47817 Spondylosis without myelopathy or radiculopathy, lumbosacral region: Secondary | ICD-10-CM

## 2021-08-25 LAB — HEMOGLOBIN A1C
Est. average glucose Bld gHb Est-mCnc: 108 mg/dL
Hgb A1c MFr Bld: 5.4 % (ref 4.8–5.6)

## 2021-08-25 LAB — CBC WITH DIFFERENTIAL/PLATELET
Basophils Absolute: 0.1 10*3/uL (ref 0.0–0.2)
Basos: 1 %
EOS (ABSOLUTE): 0.2 10*3/uL (ref 0.0–0.4)
Eos: 3 %
Hematocrit: 46.7 % (ref 37.5–51.0)
Hemoglobin: 16.3 g/dL (ref 13.0–17.7)
Immature Grans (Abs): 0 10*3/uL (ref 0.0–0.1)
Immature Granulocytes: 0 %
Lymphocytes Absolute: 0.9 10*3/uL (ref 0.7–3.1)
Lymphs: 13 %
MCH: 30.2 pg (ref 26.6–33.0)
MCHC: 34.9 g/dL (ref 31.5–35.7)
MCV: 87 fL (ref 79–97)
Monocytes Absolute: 0.7 10*3/uL (ref 0.1–0.9)
Monocytes: 10 %
Neutrophils Absolute: 5.2 10*3/uL (ref 1.4–7.0)
Neutrophils: 73 %
Platelets: 233 10*3/uL (ref 150–450)
RBC: 5.4 x10E6/uL (ref 4.14–5.80)
RDW: 12.9 % (ref 11.6–15.4)
WBC: 7.1 10*3/uL (ref 3.4–10.8)

## 2021-08-25 LAB — COMPREHENSIVE METABOLIC PANEL
ALT: 15 IU/L (ref 0–44)
AST: 15 IU/L (ref 0–40)
Albumin/Globulin Ratio: 1.8 (ref 1.2–2.2)
Albumin: 4.3 g/dL (ref 3.7–4.7)
Alkaline Phosphatase: 74 IU/L (ref 44–121)
BUN/Creatinine Ratio: 16 (ref 10–24)
BUN: 15 mg/dL (ref 8–27)
Bilirubin Total: 1 mg/dL (ref 0.0–1.2)
CO2: 26 mmol/L (ref 20–29)
Calcium: 9.3 mg/dL (ref 8.6–10.2)
Chloride: 103 mmol/L (ref 96–106)
Creatinine, Ser: 0.96 mg/dL (ref 0.76–1.27)
Globulin, Total: 2.4 g/dL (ref 1.5–4.5)
Glucose: 103 mg/dL — ABNORMAL HIGH (ref 70–99)
Potassium: 3.9 mmol/L (ref 3.5–5.2)
Sodium: 141 mmol/L (ref 134–144)
Total Protein: 6.7 g/dL (ref 6.0–8.5)
eGFR: 84 mL/min/{1.73_m2} (ref >59)

## 2021-08-25 LAB — LIPID PANEL
Chol/HDL Ratio: 3.6 ratio (ref 0.0–5.0)
Cholesterol, Total: 228 mg/dL — ABNORMAL HIGH (ref 100–199)
HDL: 63 mg/dL (ref >39)
LDL Chol Calc (NIH): 150 mg/dL — ABNORMAL HIGH (ref 0–99)
Triglycerides: 84 mg/dL (ref 0–149)
VLDL Cholesterol Cal: 15 mg/dL (ref 5–40)

## 2021-08-25 LAB — TSH: TSH: 1.8 u[IU]/mL (ref 0.450–4.500)

## 2021-08-26 DIAGNOSIS — M5136 Other intervertebral disc degeneration, lumbar region: Secondary | ICD-10-CM | POA: Diagnosis not present

## 2021-08-26 DIAGNOSIS — M4726 Other spondylosis with radiculopathy, lumbar region: Secondary | ICD-10-CM | POA: Diagnosis not present

## 2021-08-26 DIAGNOSIS — M48062 Spinal stenosis, lumbar region with neurogenic claudication: Secondary | ICD-10-CM | POA: Diagnosis not present

## 2021-09-15 ENCOUNTER — Other Ambulatory Visit: Payer: Self-pay | Admitting: Nurse Practitioner

## 2021-09-15 DIAGNOSIS — K760 Fatty (change of) liver, not elsewhere classified: Secondary | ICD-10-CM

## 2021-09-15 DIAGNOSIS — Z86010 Personal history of colonic polyps: Secondary | ICD-10-CM | POA: Diagnosis not present

## 2021-09-15 DIAGNOSIS — K7400 Hepatic fibrosis, unspecified: Secondary | ICD-10-CM

## 2021-09-15 DIAGNOSIS — Z800 Family history of malignant neoplasm of digestive organs: Secondary | ICD-10-CM | POA: Diagnosis not present

## 2021-09-26 ENCOUNTER — Ambulatory Visit (INDEPENDENT_AMBULATORY_CARE_PROVIDER_SITE_OTHER): Payer: Medicare Other | Admitting: Physician Assistant

## 2021-09-26 ENCOUNTER — Encounter: Payer: Self-pay | Admitting: Physician Assistant

## 2021-09-26 VITALS — BP 101/72 | HR 55 | Temp 98.5°F | Resp 14 | Wt 214.0 lb

## 2021-09-26 DIAGNOSIS — R0600 Dyspnea, unspecified: Secondary | ICD-10-CM | POA: Diagnosis not present

## 2021-09-26 DIAGNOSIS — J019 Acute sinusitis, unspecified: Secondary | ICD-10-CM | POA: Diagnosis not present

## 2021-09-26 DIAGNOSIS — H6590 Unspecified nonsuppurative otitis media, unspecified ear: Secondary | ICD-10-CM | POA: Diagnosis not present

## 2021-09-26 MED ORDER — DOXYCYCLINE HYCLATE 100 MG PO TABS: 100.0000 mg | ORAL_TABLET | Freq: Two times a day (BID) | ORAL | 0 refills | Status: DC

## 2021-09-26 MED ORDER — PREDNISONE 20 MG PO TABS: 20.0000 mg | ORAL_TABLET | Freq: Every day | ORAL | 0 refills | Status: DC

## 2021-09-26 MED ORDER — FLUTICASONE PROPIONATE 50 MCG/ACT NA SUSP: 2.0000 | Freq: Every day | NASAL | 6 refills | Status: DC

## 2021-09-26 MED ORDER — ALBUTEROL SULFATE HFA 108 (90 BASE) MCG/ACT IN AERS: 2.0000 | INHALATION_SPRAY | Freq: Four times a day (QID) | RESPIRATORY_TRACT | 2 refills | Status: DC | PRN

## 2021-09-26 NOTE — Progress Notes (Signed)
?  ? ?I,Wister Hoefle Robinson,acting as a Education administrator for [employer], PA-C.,have documented all relevant documentation on the behalf of Daniel Speak, PA-C,as directed by  [employer], PA-C while in the presence of [employer], PA-C. ? ? ?Established patient visit ? ? ?Patient: Daniel [name]   DOB: 30-Dec-1950   71 y.o. Male  MRN: [id number] ?Visit Date: 09/26/2021 ? ?Today's healthcare provider: Mardene Speak, PA-C  ? ?Chief Complaint  ?Patient presents with  ? Cough  ?GY:JEHUD ?Subjective  ?  ?HPI  ?Cough and "sinus infection" ?He complains of congestion, cough described as productive, nasal congestion, post nasal drip, productive cough with  yellow and green colored sputum, and sneezing.with no fever, chills, night sweats or weight loss. Onset of symptoms was about a week ago and staying constant.He is drinking plenty of fluids.  Past history is significant for asthma/slight per pt. Patient is non-smoker. Patient taking Musinex DM since onset was very little improvement.  Patient denies tightness in chest but does notice some shortness of breath with walking.  ?Denies having sick contacts, or wheezing ?Endorses having cough at night," through  the night." ?Had sore throat and sinus pain/pressure, as well as headache ?Endorses having muscle aches and fatigue ? ?---------------------------------------------------------------------------------------------------  ? ?Medications: ?Outpatient Medications Prior to Visit  ?Medication Sig  ? ELIQUIS 5 MG TABS tablet Take 5 mg by mouth 2 (two) times daily.  ? Famotidine (PEPCID PO) Take 1 tablet by mouth daily.  ? levothyroxine (SYNTHROID) 100 MCG tablet TAKE 1 TABLET BY MOUTH EVERY DAY BEFORE BREAKFAST  ? metoprolol succinate (TOPROL-XL) 25 MG 24 hr tablet Take 25 mg by mouth daily.  ? Multiple Vitamin (MULTIVITAMIN) capsule Take 1 capsule daily by mouth.  ? pantoprazole (PROTONIX) 40 MG tablet TAKE 1 TABLET(40 MG) BY MOUTH TWICE DAILY  ? ?No facility-administered medications  prior to visit.  ? ? ?Review of Systems  ?Constitutional:  Positive for fatigue. Negative for chills and fever.  ?HENT:  Positive for congestion, ear pain (ear pressure), postnasal drip, rhinorrhea and sneezing.   ?Eyes:  Negative for visual disturbance.  ?Respiratory:  Positive for cough, chest tightness and shortness of breath.   ?Cardiovascular: Negative.   ?Gastrointestinal: Negative.   ? ? ?  Objective  ?  ?BP 101/72 (BP Location: Left Arm, Patient Position: Sitting, Cuff Size: Normal)   Pulse (!) 55   Temp 98.5 ?F (36.9 ?C) (Oral)   Resp 14   Wt 214 lb (97.1 kg)   SpO2 100%   BMI 27.48 kg/m?  ? ? ?Physical Exam ?Vitals and nursing note reviewed.  ?Constitutional:   ?   General: He is in acute distress.  ?   Appearance: Normal appearance.  ?HENT:  ?   Head: Normocephalic and atraumatic.  ?   Ears:  ?   Comments: Fluids behind the Tms billaterally ?   Nose: Congestion and rhinorrhea present.  ?   Mouth/Throat:  ?   Mouth: Mucous membranes are moist.  ?Eyes:  ?   Extraocular Movements: Extraocular movements intact.  ?   Conjunctiva/sclera: Conjunctivae normal.  ?   Pupils: Pupils are equal, round, and reactive to light.  ?Cardiovascular:  ?   Rate and Rhythm: Normal rate and regular rhythm.  ?   Pulses: Normal pulses.  ?   Heart sounds: Normal heart sounds.  ?Pulmonary:  ?   Effort: Pulmonary effort is normal.  ?   Breath sounds: Normal breath sounds.  ?Abdominal:  ?   General: Abdomen is  flat. Bowel sounds are normal.  ?Neurological:  ?   Mental Status: He is alert and oriented to person, place, and time.  ?Psychiatric:     ?   Behavior: Behavior normal.     ?   Thought Content: Thought content normal.     ?   Judgment: Judgment normal.  ?  ? ? ?No results found for any visits on 09/26/21. ? Assessment & Plan  ?  ? ?1. Otitis media with effusion, unspecified laterality ?- fluticasone (FLONASE) 50 MCG/ACT nasal spray; Place 2 sprays into both nostrils daily.  Dispense: 16 g; Refill: 6 ?- predniSONE  (DELTASONE) 20 MG tablet; Take 1 tablet (20 mg total) by mouth daily with breakfast.  Dispense: 10 tablet; Refill: 0 ? ?2. Dyspnea, unspecified type ?- albuterol (VENTOLIN HFA) 108 (90 Base) MCG/ACT inhaler; Inhale 2 puffs into the lungs every 6 (six) hours as needed for wheezing or shortness of breath.  Dispense: 8 g; Refill: 2 ?- predniSONE (DELTASONE) 20 MG tablet; Take 1 tablet (20 mg total) by mouth daily with breakfast.  Dispense: 10 tablet; Refill: 0 ? ?3. Acute rhinosinusitis ? ?- doxycycline (VIBRA-TABS) 100 MG tablet; Take 1 tablet (100 mg total) by mouth 2 (two) times daily.  Dispense: 14 tablet; Refill: 0 ?- predniSONE (DELTASONE) 20 MG tablet; Take 1 tablet (20 mg total) by mouth daily with breakfast.  Dispense: 10 tablet; Refill: 0 ? ?We suggested over-the-counter analgesics and saline nasal irrigation, steam inhalation. Advised treatment with intranasal glucocorticoids as patient has underlying allergic rhinitis.  ?However, due to worsening of some symptoms, including purulent nasal discharge and severe nasal congestion, antibiotic was sent to patient's pharmacy. Also,a short-term therapy with prednisone was added.  However, patient should be considerate of  ?a potential risk for side effects of systemic steroids that outweighs the clinical benefits.  ? ?Self-limited nature of ARS was explained to patient. Information that 70 to 80 percent of immunocompetent patients improve within two weeks without antibiotic therapy was provided. ? ? ?FU as needed ? ?The patient was advised to call back or seek an in-person evaluation if the symptoms worsen or if the condition fails to improve as anticipated. ? ?I discussed the assessment and treatment plan with the patient. The patient was provided an opportunity to ask questions and all were answered. The patient agreed with the plan and demonstrated an understanding of the instructions. ? ?The entirety of the information documented in the History of Present  Illness, Review of Systems and Physical Exam were personally obtained by me. Portions of this information were initially documented by the CMA and reviewed by me for thoroughness and accuracy.  ? ?Portions of this note were created using dictation software and may contain typographical errors.  ? ? ?Daniel Speak, PA-C  ?[place] ?[phone] (phone) ?[phone] (fax) ? ?[facility] ?

## 2021-10-08 ENCOUNTER — Other Ambulatory Visit: Payer: Self-pay | Admitting: Family Medicine

## 2021-10-08 DIAGNOSIS — K219 Gastro-esophageal reflux disease without esophagitis: Secondary | ICD-10-CM

## 2021-10-11 ENCOUNTER — Ambulatory Visit
Admission: RE | Admit: 2021-10-11 | Discharge: 2021-10-11 | Disposition: A | Discharge status: Home / Self Care | Payer: Medicare Other | Source: Ambulatory Visit | Attending: Nurse Practitioner | Admitting: Nurse Practitioner

## 2021-10-11 DIAGNOSIS — K760 Fatty (change of) liver, not elsewhere classified: Secondary | ICD-10-CM | POA: Diagnosis not present

## 2021-10-11 DIAGNOSIS — K7581 Nonalcoholic steatohepatitis (NASH): Secondary | ICD-10-CM | POA: Diagnosis not present

## 2021-10-11 DIAGNOSIS — K7400 Hepatic fibrosis, unspecified: Secondary | ICD-10-CM | POA: Insufficient documentation

## 2021-10-11 DIAGNOSIS — Z86010 Personal history of colonic polyps: Secondary | ICD-10-CM | POA: Diagnosis not present

## 2021-10-17 ENCOUNTER — Encounter: Payer: Self-pay | Admitting: Family Medicine

## 2021-10-18 DIAGNOSIS — D400 Neoplasm of uncertain behavior of prostate: Secondary | ICD-10-CM | POA: Diagnosis not present

## 2021-10-18 DIAGNOSIS — N401 Enlarged prostate with lower urinary tract symptoms: Secondary | ICD-10-CM | POA: Diagnosis not present

## 2021-10-19 DIAGNOSIS — M48062 Spinal stenosis, lumbar region with neurogenic claudication: Secondary | ICD-10-CM | POA: Diagnosis not present

## 2021-10-19 DIAGNOSIS — M5416 Radiculopathy, lumbar region: Secondary | ICD-10-CM | POA: Diagnosis not present

## 2021-10-20 ENCOUNTER — Ambulatory Visit: Payer: Self-pay | Admitting: *Deleted

## 2021-10-20 NOTE — Telephone Encounter (Signed)
  Chief Complaint: Requesting prednisone 20 mg  6 pills for an upcoming out of town trip for back pain.   Would Dr. [name] prescribe 6 pills for me?   Symptoms: N/A Frequency: N/A Pertinent Negatives: Patient denies N/A Disposition: '[]'$ ED /'[]'$ Urgent Care (no appt availability in office) / '[]'$ Appointment(In office/virtual)/ '[]'$  [facility]/ '[]'$ Home Care/ '[]'$ Refused Recommended Disposition /'[]'$ [facility]/ '[x]'$  Follow-up with PCP Additional Notes: Message sent to Dr. [name] at [facility].

## 2021-10-20 NOTE — Telephone Encounter (Signed)
Message from Luciana Axe sent at 10/20/2021  2:47 PM EDT  Summary: Advice predniSONE (DELTASONE) 20 MG tablet [[id number]]   Pt is calling to request a refill of predniSONE (DELTASONE) 20 MG tablet [[id number]] . Pt reports that he is requesting the refill to prevent pain for a trip. Pt was seen in office on 09/26/21. Please advise          Reason for Disposition  [1] Caller has URGENT medicine question about med that PCP or specialist prescribed AND [2] triager unable to answer question    Requesting prednisone for an upcoming trip for his back pain  Answer Assessment - Initial Assessment Questions 1. NAME of MEDICATION: "What medicine are you calling about?"     Prednisone 20 mg 2. QUESTION: "What is your question?" (e.g., double dose of medicine, side effect)     Would Dr. [name] be willing to prescribe 6 pills for me?   He did this for me back in Feb. Too.   I have something wrong with my back that causes me a lot of pain when I walk a lot.   I'm going to [facility] and I want to be able to do the things I want to do without being in so much pain.   The prednisone really helps with that. 3. PRESCRIBING HCP: "Who prescribed it?" Reason: if prescribed by specialist, call should be referred to that group.     Dr. Miguel Aschoff 4. SYMPTOMS: "Do you have any symptoms?"     It's for my upcoming trip.  I leaving [place]. Morning.   Is it possible to have it by then?   I sent a message to Dr. [name] via [facility] on 10/17/2021 but I haven't gotten a response.   5. SEVERITY: If symptoms are present, ask "Are they mild, moderate or severe?"     N/A 6. PREGNANCY:  "Is there any chance that you are pregnant?" "When was your last menstrual period?"     N/A  Protocols used: Medication Question Call-A-AH

## 2021-10-21 ENCOUNTER — Other Ambulatory Visit: Payer: Self-pay | Admitting: Physician Assistant

## 2021-10-21 DIAGNOSIS — M545 Low back pain, unspecified: Secondary | ICD-10-CM

## 2021-10-21 MED ORDER — PREDNISONE 20 MG PO TABS: 20.0000 mg | ORAL_TABLET | Freq: Every day | ORAL | 0 refills | Status: DC

## 2021-10-21 NOTE — Telephone Encounter (Signed)
Please advise 

## 2021-10-21 NOTE — Progress Notes (Signed)
Pt was sent prednisone '20mg'$  x 6 Per agreement with his PCP

## 2021-10-21 NOTE — Telephone Encounter (Signed)
Pt informed

## 2021-11-01 DIAGNOSIS — N5201 Erectile dysfunction due to arterial insufficiency: Secondary | ICD-10-CM | POA: Diagnosis not present

## 2021-11-01 DIAGNOSIS — N401 Enlarged prostate with lower urinary tract symptoms: Secondary | ICD-10-CM | POA: Diagnosis not present

## 2021-11-09 ENCOUNTER — Ambulatory Visit: Payer: Self-pay | Admitting: *Deleted

## 2021-11-09 NOTE — Telephone Encounter (Signed)
  Chief Complaint: no energy  Symptoms: tires easily, back pain joint pain  Frequency: 3-4 weeks  Pertinent Negatives: Patient denies chest pain no fever.  Disposition: '[]'$ ED /'[]'$ Urgent Care (no appt availability in office) / '[x]'$ Appointment(In office/virtual)/ '[]'$  [facility]/ '[]'$ Home Care/ '[]'$ Refused Recommended Disposition /'[]'$  Mobile Bus/ '[]'$  Follow-up with PCP Additional Notes:   Offered earlier appt and patient refused appt scheduled for 11/15/21.    Reason for Disposition  [1] Fatigue (i.e., tires easily, decreased energy) AND [2] persists > 1 week  Answer Assessment - Initial Assessment Questions 1. DESCRIPTION: "Describe how you are feeling."     Wake up feels ok and then no energy during the day  2. SEVERITY: "How bad is it?"  "Can you stand and walk?"   - MILD - Feels weak or tired, but does not interfere with work, school or normal activities   - [place] to stand and walk; weakness interferes with work, school, or normal activities   - SEVERE - Unable to stand or walk     Feels tired all of the time and no energy  3. ONSET:  "When did the weakness begin?"     3-4 weeks ago  4. CAUSE: "What do you think is causing the weakness?"     Not sure  5. MEDICINES: "Have you recently started a new medicine or had a change in the amount of a medicine?"     Not sure maybe eliquis  6. OTHER SYMPTOMS: "Do you have any other symptoms?" (e.g., chest pain, fever, cough, SOB, vomiting, diarrhea, bleeding, other areas of pain)     Joint pain , no energy 7. PREGNANCY: "Is there any chance you are pregnant?" "When was your last menstrual period?"     na  Protocols used: Weakness (Generalized) and Fatigue-A-AH

## 2021-11-10 DIAGNOSIS — M5136 Other intervertebral disc degeneration, lumbar region: Secondary | ICD-10-CM | POA: Diagnosis not present

## 2021-11-10 DIAGNOSIS — M48062 Spinal stenosis, lumbar region with neurogenic claudication: Secondary | ICD-10-CM | POA: Diagnosis not present

## 2021-11-10 DIAGNOSIS — M5416 Radiculopathy, lumbar region: Secondary | ICD-10-CM | POA: Diagnosis not present

## 2021-11-14 NOTE — Progress Notes (Unsigned)
I,Sulibeya S Dimas,acting as a scribe for Wilhemena Durie, MD.,have documented all relevant documentation on the behalf of Wilhemena Durie, MD,as directed by  Wilhemena Durie, MD while in the presence of Wilhemena Durie, MD.   Established patient visit   Patient: Daniel [name]   DOB: May 23, 1951   71 y.o. Male  MRN: [id number] Visit Date: 11/15/2021  Today's healthcare provider: Wilhemena Durie, MD   Chief Complaint  Patient presents with   Fatigue   Subjective    HPI  Fatigue  He reports new onset fatigue which he describes as a lack of energy, feeling exhausted, feeling sleepy, and feeling weak. It began  3 weeks ago and occurs around mid day and late in the day. It is described as moderate and staying constant. He has started new medications prescribed by ortho.. Gabapentin in the last week.  Associated symptoms: Yes arthralgias Yes bleeding  No melena No chest discomfort  No heart palpitations No heart racing   Yes dyspnea No feeling depressed  Yes feeling anxious or under stress No fevers  No loss of appetite No nausea  No vomiting No sleeping problems    Wt Readings from Last 3 Encounters:  11/15/21 216 lb (98 kg)  09/26/21 214 lb (97.1 kg)  08/24/21 215 lb 8 oz (97.8 kg)    Lab Results  Component Value Date   WBC 7.1 08/24/2021   HGB 16.3 08/24/2021   HCT 46.7 08/24/2021   MCV 87 08/24/2021   PLT 233 08/24/2021   Lab Results  Component Value Date   TSH 1.800 08/24/2021   Lab Results  Component Value Date   NA 141 08/24/2021   K 3.9 08/24/2021   CO2 26 08/24/2021   BUN 15 08/24/2021   CREATININE 0.96 08/24/2021   CALCIUM 9.3 08/24/2021   GLUCOSE 103 (H) 08/24/2021     --------------------------------------------------------------------------------------------------- Follow up for acute on chronic mid low back pain  The patient was last seen for this 1 weeks ago Ortho Dr Sharlet Salina. Changes made at last visit include started  gabapentin. Patient also referred to physical therapy. He reports PT will start in July.  He reports excellent compliance with treatment. He feels that condition is Unchanged. He is not having side effects.   -----------------------------------------------------------------------------------------   Medications: Outpatient Medications Prior to Visit  Medication Sig   albuterol (VENTOLIN HFA) 108 (90 Base) MCG/ACT inhaler Inhale 2 puffs into the lungs every 6 (six) hours as needed for wheezing or shortness of breath.   ELIQUIS 5 MG TABS tablet Take 5 mg by mouth 2 (two) times daily.   Famotidine (PEPCID PO) Take 1 tablet by mouth daily.   fluticasone (FLONASE) 50 MCG/ACT nasal spray Place 2 sprays into both nostrils daily.   gabapentin (NEURONTIN) 100 MG capsule Take 100 mg by mouth at bedtime.   levothyroxine (SYNTHROID) 100 MCG tablet TAKE 1 TABLET BY MOUTH EVERY DAY BEFORE BREAKFAST   metoprolol succinate (TOPROL-XL) 25 MG 24 hr tablet Take 25 mg by mouth daily.   Multiple Vitamin (MULTIVITAMIN) capsule Take 1 capsule daily by mouth.   pantoprazole (PROTONIX) 40 MG tablet TAKE 1 TABLET(40 MG) BY MOUTH TWICE DAILY   predniSONE (DELTASONE) 20 MG tablet Take 1 tablet (20 mg total) by mouth daily with breakfast.   predniSONE (DELTASONE) 20 MG tablet Take 1 tablet (20 mg total) by mouth daily with breakfast.   doxycycline (VIBRA-TABS) 100 MG tablet Take 1 tablet (100 mg total) by  mouth 2 (two) times daily.   No facility-administered medications prior to visit.    Review of Systems  Constitutional:  Positive for fatigue. Negative for appetite change.  HENT:  Positive for nosebleeds.   Respiratory:  Positive for shortness of breath.   Musculoskeletal:  Positive for arthralgias and back pain.  Neurological:  Positive for weakness.  Psychiatric/Behavioral:  The patient is nervous/anxious.     Last CBC Lab Results  Component Value Date   WBC 7.1 08/24/2021   HGB 16.3 08/24/2021    HCT 46.7 08/24/2021   MCV 87 08/24/2021   MCH 30.2 08/24/2021   RDW 12.9 08/24/2021   PLT 233 83/25/4982   Last metabolic panel Lab Results  Component Value Date   GLUCOSE 103 (H) 08/24/2021   NA 141 08/24/2021   K 3.9 08/24/2021   CL 103 08/24/2021   CO2 26 08/24/2021   BUN 15 08/24/2021   CREATININE 0.96 08/24/2021   EGFR 84 08/24/2021   CALCIUM 9.3 08/24/2021   PROT 6.7 08/24/2021   ALBUMIN 4.3 08/24/2021   LABGLOB 2.4 08/24/2021   AGRATIO 1.8 08/24/2021   BILITOT 1.0 08/24/2021   ALKPHOS 74 08/24/2021   AST 15 08/24/2021   ALT 15 08/24/2021   Last lipids Lab Results  Component Value Date   CHOL 228 (H) 08/24/2021   HDL 63 08/24/2021   LDLCALC 150 (H) 08/24/2021   TRIG 84 08/24/2021   CHOLHDL 3.6 08/24/2021   Last hemoglobin A1c Lab Results  Component Value Date   HGBA1C 5.4 08/24/2021   Last thyroid functions Lab Results  Component Value Date   TSH 1.800 08/24/2021   T4TOTAL 7.3 05/27/2020   Last vitamin D Lab Results  Component Value Date   VD25OH 24.1 (L) 05/17/2021   Last vitamin B12 and Folate Lab Results  Component Value Date   VITAMINB12 363 05/17/2021       Objective    BP 92/66 (BP Location: Left Arm, Patient Position: Sitting, Cuff Size: Normal)   Pulse (!) 59   Temp 98.7 F (37.1 C) (Oral)   Resp 16   Wt 216 lb (98 kg)   SpO2 96%   BMI 27.73 kg/m  BP Readings from Last 3 Encounters:  11/15/21 92/66  09/26/21 101/72  08/24/21 127/83   Wt Readings from Last 3 Encounters:  11/15/21 216 lb (98 kg)  09/26/21 214 lb (97.1 kg)  08/24/21 215 lb 8 oz (97.8 kg)      Physical Exam  ***  No results found for any visits on 11/15/21.  Assessment & Plan     ***  No follow-ups on file.      {provider attestation***:1}   Wilhemena Durie, MD  [facility] [phone] (phone) [phone] (fax)  [place]

## 2021-11-15 ENCOUNTER — Encounter: Payer: Self-pay | Admitting: Family Medicine

## 2021-11-15 ENCOUNTER — Ambulatory Visit (INDEPENDENT_AMBULATORY_CARE_PROVIDER_SITE_OTHER): Payer: Medicare Other | Admitting: Family Medicine

## 2021-11-15 VITALS — BP 92/66 | HR 59 | Temp 98.7°F | Resp 16 | Wt 216.0 lb

## 2021-11-15 DIAGNOSIS — E6609 Other obesity due to excess calories: Secondary | ICD-10-CM

## 2021-11-15 DIAGNOSIS — R5382 Chronic fatigue, unspecified: Secondary | ICD-10-CM | POA: Diagnosis not present

## 2021-11-15 DIAGNOSIS — E038 Other specified hypothyroidism: Secondary | ICD-10-CM

## 2021-11-15 DIAGNOSIS — Z6830 Body mass index (BMI) 30.0-30.9, adult: Secondary | ICD-10-CM

## 2021-11-15 DIAGNOSIS — M545 Low back pain, unspecified: Secondary | ICD-10-CM | POA: Diagnosis not present

## 2021-11-15 DIAGNOSIS — R059 Cough, unspecified: Secondary | ICD-10-CM

## 2021-11-15 DIAGNOSIS — G8929 Other chronic pain: Secondary | ICD-10-CM

## 2021-11-15 DIAGNOSIS — I950 Idiopathic hypotension: Secondary | ICD-10-CM

## 2021-11-15 MED ORDER — FLUDROCORTISONE ACETATE 0.1 MG PO TABS: 0.1000 mg | ORAL_TABLET | Freq: Every day | ORAL | 11 refills | Status: DC

## 2021-11-16 ENCOUNTER — Ambulatory Visit
Admission: RE | Admit: 2021-11-16 | Discharge: 2021-11-16 | Disposition: A | Discharge status: Home / Self Care | Payer: Medicare Other | Source: Ambulatory Visit | Attending: Family Medicine | Admitting: Family Medicine

## 2021-11-16 ENCOUNTER — Ambulatory Visit
Admission: RE | Admit: 2021-11-16 | Discharge: 2021-11-16 | Disposition: A | Discharge status: Home / Self Care | Payer: Medicare Other | Attending: Family Medicine | Admitting: Family Medicine

## 2021-11-16 DIAGNOSIS — Z6830 Body mass index (BMI) 30.0-30.9, adult: Secondary | ICD-10-CM | POA: Diagnosis not present

## 2021-11-16 DIAGNOSIS — R059 Cough, unspecified: Secondary | ICD-10-CM | POA: Insufficient documentation

## 2021-11-16 DIAGNOSIS — E6609 Other obesity due to excess calories: Secondary | ICD-10-CM | POA: Diagnosis not present

## 2021-11-16 DIAGNOSIS — M545 Low back pain, unspecified: Secondary | ICD-10-CM | POA: Diagnosis not present

## 2021-11-16 DIAGNOSIS — R5382 Chronic fatigue, unspecified: Secondary | ICD-10-CM | POA: Diagnosis not present

## 2021-11-17 LAB — CBC WITH DIFFERENTIAL/PLATELET
Basophils Absolute: 0.1 10*3/uL (ref 0.0–0.2)
Basos: 1 %
EOS (ABSOLUTE): 0.2 10*3/uL (ref 0.0–0.4)
Eos: 4 %
Hematocrit: 48.4 % (ref 37.5–51.0)
Hemoglobin: 16.9 g/dL (ref 13.0–17.7)
Immature Grans (Abs): 0 10*3/uL (ref 0.0–0.1)
Immature Granulocytes: 0 %
Lymphocytes Absolute: 0.9 10*3/uL (ref 0.7–3.1)
Lymphs: 15 %
MCH: 30.1 pg (ref 26.6–33.0)
MCHC: 34.9 g/dL (ref 31.5–35.7)
MCV: 86 fL (ref 79–97)
Monocytes Absolute: 0.7 10*3/uL (ref 0.1–0.9)
Monocytes: 13 %
Neutrophils Absolute: 4 10*3/uL (ref 1.4–7.0)
Neutrophils: 67 %
Platelets: 258 10*3/uL (ref 150–450)
RBC: 5.61 x10E6/uL (ref 4.14–5.80)
RDW: 12.8 % (ref 11.6–15.4)
WBC: 5.9 10*3/uL (ref 3.4–10.8)

## 2021-11-17 LAB — RENAL FUNCTION PANEL
Albumin: 4.1 g/dL (ref 3.7–4.7)
BUN/Creatinine Ratio: 18 (ref 10–24)
BUN: 19 mg/dL (ref 8–27)
CO2: 24 mmol/L (ref 20–29)
Calcium: 9.2 mg/dL (ref 8.6–10.2)
Chloride: 100 mmol/L (ref 96–106)
Creatinine, Ser: 1.03 mg/dL (ref 0.76–1.27)
Glucose: 93 mg/dL (ref 70–99)
Phosphorus: 3.4 mg/dL (ref 2.8–4.1)
Potassium: 4.3 mmol/L (ref 3.5–5.2)
Sodium: 137 mmol/L (ref 134–144)
eGFR: 77 mL/min/{1.73_m2} (ref >59)

## 2021-11-17 LAB — IRON,TIBC AND FERRITIN PANEL
Ferritin: 198 ng/mL (ref 30–400)
Iron Saturation: 29 % (ref 15–55)
Iron: 83 ug/dL (ref 38–169)
Total Iron Binding Capacity: 284 ug/dL (ref 250–450)
UIBC: 201 ug/dL (ref 111–343)

## 2021-11-17 LAB — SEDIMENTATION RATE: Sed Rate: 14 mm/hr (ref 0–30)

## 2021-11-17 LAB — TSH: TSH: 1.86 u[IU]/mL (ref 0.450–4.500)

## 2021-11-23 ENCOUNTER — Encounter: Payer: Self-pay | Admitting: Family Medicine

## 2021-11-23 ENCOUNTER — Ambulatory Visit (INDEPENDENT_AMBULATORY_CARE_PROVIDER_SITE_OTHER): Payer: Medicare Other | Admitting: Family Medicine

## 2021-11-23 VITALS — BP 90/60 | HR 62 | Resp 16 | Wt 217.0 lb

## 2021-11-23 DIAGNOSIS — M5136 Other intervertebral disc degeneration, lumbar region: Secondary | ICD-10-CM | POA: Diagnosis not present

## 2021-11-23 DIAGNOSIS — G8929 Other chronic pain: Secondary | ICD-10-CM

## 2021-11-23 DIAGNOSIS — M545 Low back pain, unspecified: Secondary | ICD-10-CM

## 2021-11-23 DIAGNOSIS — M47817 Spondylosis without myelopathy or radiculopathy, lumbosacral region: Secondary | ICD-10-CM

## 2021-11-23 DIAGNOSIS — I950 Idiopathic hypotension: Secondary | ICD-10-CM | POA: Diagnosis not present

## 2021-11-23 DIAGNOSIS — K760 Fatty (change of) liver, not elsewhere classified: Secondary | ICD-10-CM

## 2021-11-23 DIAGNOSIS — J209 Acute bronchitis, unspecified: Secondary | ICD-10-CM

## 2021-11-23 DIAGNOSIS — J4520 Mild intermittent asthma, uncomplicated: Secondary | ICD-10-CM | POA: Diagnosis not present

## 2021-11-23 DIAGNOSIS — E038 Other specified hypothyroidism: Secondary | ICD-10-CM

## 2021-11-23 DIAGNOSIS — M51369 Other intervertebral disc degeneration, lumbar region without mention of lumbar back pain or lower extremity pain: Secondary | ICD-10-CM

## 2021-11-23 DIAGNOSIS — N400 Enlarged prostate without lower urinary tract symptoms: Secondary | ICD-10-CM

## 2021-11-23 DIAGNOSIS — J440 Chronic obstructive pulmonary disease with acute lower respiratory infection: Secondary | ICD-10-CM

## 2021-11-23 MED ORDER — MIDODRINE HCL 2.5 MG PO TABS: 2.5000 mg | ORAL_TABLET | Freq: Two times a day (BID) | ORAL | 2 refills | Status: DC

## 2021-11-23 NOTE — Progress Notes (Signed)
Established patient visit  I,April Miller,acting as a scribe for Wilhemena Durie, MD.,have documented all relevant documentation on the behalf of Wilhemena Durie, MD,as directed by  Wilhemena Durie, MD while in the presence of Wilhemena Durie, MD.   Patient: Daniel [name]   DOB: [birth year]   71 y.o. Male  MRN: [id number] Visit Date: 11/23/2021  Today's healthcare provider: Wilhemena Durie, MD   Chief Complaint  Patient presents with   Hypotension   Subjective    HPI  Patient comes in today for follow-up for fatigue and hypotension.  Tolerating the Florinef well and is feeling a little bit better. Note is that x-ray showed changes consistent with COPD/emphysema.. Complains of chronic recurrent low back pain.  He states the prednisone helps but I told him in the past that he cannot stay on the prednisone.  MRI from 07/14/2021.  Showed degenerative disc disease discogenic endplate marrow changes L2-L4 5 Follow up for Idiopathic hypotension:  The patient was last seen for this 1 weeks ago. Changes made at last visit include This could be the etiology of his symptoms.  Try Florinef and see him back in 1 to 2 weeks. If not improving by then consider cardiology. -----------------------------------------------------------------------------------------   Medications: Outpatient Medications Prior to Visit  Medication Sig   albuterol (VENTOLIN HFA) 108 (90 Base) MCG/ACT inhaler Inhale 2 puffs into the lungs every 6 (six) hours as needed for wheezing or shortness of breath.   ELIQUIS 5 MG TABS tablet Take 5 mg by mouth 2 (two) times daily.   Famotidine (PEPCID PO) Take 1 tablet by mouth daily.   fludrocortisone (FLORINEF) 0.1 MG tablet Take 1 tablet (0.1 mg total) by mouth daily.   fluticasone (FLONASE) 50 MCG/ACT nasal spray Place 2 sprays into both nostrils daily.   gabapentin (NEURONTIN) 100 MG capsule Take 100 mg by mouth at bedtime.   levothyroxine (SYNTHROID) 100  MCG tablet TAKE 1 TABLET BY MOUTH EVERY DAY BEFORE BREAKFAST   metoprolol succinate (TOPROL-XL) 25 MG 24 hr tablet Take 25 mg by mouth daily.   Multiple Vitamin (MULTIVITAMIN) capsule Take 1 capsule daily by mouth.   pantoprazole (PROTONIX) 40 MG tablet TAKE 1 TABLET(40 MG) BY MOUTH TWICE DAILY   [DISCONTINUED] doxycycline (VIBRA-TABS) 100 MG tablet Take 1 tablet (100 mg total) by mouth 2 (two) times daily.   [DISCONTINUED] predniSONE (DELTASONE) 20 MG tablet Take 1 tablet (20 mg total) by mouth daily with breakfast.   [DISCONTINUED] predniSONE (DELTASONE) 20 MG tablet Take 1 tablet (20 mg total) by mouth daily with breakfast.   No facility-administered medications prior to visit.    Review of Systems  Constitutional:  Negative for appetite change, chills and fever.  Respiratory:  Negative for chest tightness, shortness of breath and wheezing.   Cardiovascular:  Negative for chest pain and palpitations.  Gastrointestinal:  Negative for abdominal pain, nausea and vomiting.        Objective    BP 90/60 (BP Location: Right Arm, Patient Position: Sitting, Cuff Size: Large)   Pulse 62   Resp 16   Wt 217 lb (98.4 kg)   SpO2 97%   BMI 27.86 kg/m  BP Readings from Last 3 Encounters:  11/23/21 90/60  11/15/21 92/66  09/26/21 101/72   Wt Readings from Last 3 Encounters:  11/23/21 217 lb (98.4 kg)  11/15/21 216 lb (98 kg)  09/26/21 214 lb (97.1 kg)      Physical Exam Vitals reviewed.  Constitutional:      General: He is not in acute distress.    Appearance: He is well-developed.  HENT:     Head: Normocephalic and atraumatic.     Right Ear: Hearing normal.     Left Ear: Hearing normal.     Nose: Nose normal.  Eyes:     General: Lids are normal. No scleral icterus.       Right eye: No discharge.        Left eye: No discharge.     Conjunctiva/sclera: Conjunctivae normal.  Cardiovascular:     Rate and Rhythm: Normal rate and regular rhythm.     Heart sounds: Normal heart  sounds.  Pulmonary:     Effort: Pulmonary effort is normal. No respiratory distress.  Skin:    Findings: No lesion or rash.  Neurological:     General: No focal deficit present.     Mental Status: He is alert and oriented to person, place, and time.  Psychiatric:        Mood and Affect: Mood normal.        Speech: Speech normal.        Behavior: Behavior normal.        Thought Content: Thought content normal.        Judgment: Judgment normal.       No results found for any visits on 11/23/21.  Assessment & Plan     1. Idiopathic hypotension Midodrine and follow-up in a week. - midodrine (PROAMATINE) 2.5 MG tablet; Take 1 tablet (2.5 mg total) by mouth 2 (two) times daily with a meal.  Dispense: 60 tablet; Refill: 2  2. DDD (degenerative disc disease), lumbar Referral to physical therapy is probably the best option. Try methocarbamol as a relaxant.  3. Chronic midline low back pain without sciatica  - methocarbamol (ROBAXIN) 500 MG tablet; Take 1 tablet (500 mg total) by mouth 3 (three) times daily.  Dispense: 90 tablet; Refill: 1  4. Intermittent asthma without complication, unspecified asthma severity   5. Fatty liver Need to work on diet and exercise  6. Other specified hypothyroidism For euthyroid TS  7. Spondylosis of lumbosacral region without myelopathy or radiculopathy   8. Benign prostatic hyperplasia without lower urinary tract symptoms   9. Acute bronchitis with COPD ([place]) COPD on chest x-ray.  For 2 pulmonology or start treatment if patient becomes more symptomatic.  He is comfortable with where he is now.   No follow-ups on file.      I, Wilhemena Durie, MD, have reviewed all documentation for this visit. The documentation on 11/26/21 for the exam, diagnosis, procedures, and orders are all accurate and complete.    Jovane Foutz Cranford Mon, MD  [facility] [phone] (phone) [phone] (fax)  [place]

## 2021-11-26 MED ORDER — METHOCARBAMOL 500 MG PO TABS: 500.0000 mg | ORAL_TABLET | Freq: Three times a day (TID) | ORAL | 1 refills | Status: DC

## 2021-11-30 NOTE — Progress Notes (Deleted)
      Established patient visit   Patient: Daniel [name]   DOB: 1950-07-20   71 y.o. Male  MRN: [id number] Visit Date: 12/01/2021  Today's healthcare provider: Wilhemena Durie, MD   No chief complaint on file.  Subjective    HPI  Follow up for idiopathic hypotension  The patient was last seen for this 1 weeks ago. Changes made at last visit include start midodrone 2.$RemoveBeforeDEI'5mg'AXPARKCmkpGPZnDt$  BID.  He reports {excellent/good/fair/poor:19665} compliance with treatment. He feels that condition is {improved/worse/unchanged:3041574}. He {is/is not:21021397} having side effects. ***  -----------------------------------------------------------------------------------------   Medications: Outpatient Medications Prior to Visit  Medication Sig   albuterol (VENTOLIN HFA) 108 (90 Base) MCG/ACT inhaler Inhale 2 puffs into the lungs every 6 (six) hours as needed for wheezing or shortness of breath.   ELIQUIS 5 MG TABS tablet Take 5 mg by mouth 2 (two) times daily.   Famotidine (PEPCID PO) Take 1 tablet by mouth daily.   fludrocortisone (FLORINEF) 0.1 MG tablet Take 1 tablet (0.1 mg total) by mouth daily.   fluticasone (FLONASE) 50 MCG/ACT nasal spray Place 2 sprays into both nostrils daily.   gabapentin (NEURONTIN) 100 MG capsule Take 100 mg by mouth at bedtime.   levothyroxine (SYNTHROID) 100 MCG tablet TAKE 1 TABLET BY MOUTH EVERY DAY BEFORE BREAKFAST   methocarbamol (ROBAXIN) 500 MG tablet Take 1 tablet (500 mg total) by mouth 3 (three) times daily.   metoprolol succinate (TOPROL-XL) 25 MG 24 hr tablet Take 25 mg by mouth daily.   midodrine (PROAMATINE) 2.5 MG tablet Take 1 tablet (2.5 mg total) by mouth 2 (two) times daily with a meal.   Multiple Vitamin (MULTIVITAMIN) capsule Take 1 capsule daily by mouth.   pantoprazole (PROTONIX) 40 MG tablet TAKE 1 TABLET(40 MG) BY MOUTH TWICE DAILY   No facility-administered medications prior to visit.    Review of Systems  Last CBC Lab Results  Component  Value Date   WBC 5.9 11/16/2021   HGB 16.9 11/16/2021   HCT 48.4 11/16/2021   MCV 86 11/16/2021   MCH 30.1 11/16/2021   RDW 12.8 11/16/2021   PLT 258 00/17/4944   Last metabolic panel Lab Results  Component Value Date   GLUCOSE 93 11/16/2021   NA 137 11/16/2021   K 4.3 11/16/2021   CL 100 11/16/2021   CO2 24 11/16/2021   BUN 19 11/16/2021   CREATININE 1.03 11/16/2021   EGFR 77 11/16/2021   CALCIUM 9.2 11/16/2021   PHOS 3.4 11/16/2021   PROT 6.7 08/24/2021   ALBUMIN 4.1 11/16/2021   LABGLOB 2.4 08/24/2021   AGRATIO 1.8 08/24/2021   BILITOT 1.0 08/24/2021   ALKPHOS 74 08/24/2021   AST 15 08/24/2021   ALT 15 08/24/2021       Objective    There were no vitals taken for this visit. BP Readings from Last 3 Encounters:  11/23/21 90/60  11/15/21 92/66  09/26/21 101/72   Wt Readings from Last 3 Encounters:  11/23/21 217 lb (98.4 kg)  11/15/21 216 lb (98 kg)  09/26/21 214 lb (97.1 kg)      Physical Exam  ***  No results found for any visits on 12/01/21.  Assessment & Plan     ***  No follow-ups on file.      {provider attestation***:1}   Wilhemena Durie, MD  [facility] [phone] (phone) [phone] (fax)  [place]

## 2021-12-01 ENCOUNTER — Ambulatory Visit: Payer: Medicare Other | Admitting: Family Medicine

## 2021-12-05 ENCOUNTER — Encounter: Payer: Self-pay | Admitting: Family Medicine

## 2021-12-05 ENCOUNTER — Ambulatory Visit (INDEPENDENT_AMBULATORY_CARE_PROVIDER_SITE_OTHER): Payer: Medicare Other | Admitting: Family Medicine

## 2021-12-05 VITALS — BP 155/84 | HR 58 | Resp 16 | Ht 74.0 in | Wt 212.0 lb

## 2021-12-05 DIAGNOSIS — M545 Low back pain, unspecified: Secondary | ICD-10-CM

## 2021-12-05 DIAGNOSIS — M5136 Other intervertebral disc degeneration, lumbar region: Secondary | ICD-10-CM

## 2021-12-05 DIAGNOSIS — I950 Idiopathic hypotension: Secondary | ICD-10-CM

## 2021-12-05 DIAGNOSIS — R5381 Other malaise: Secondary | ICD-10-CM

## 2021-12-05 DIAGNOSIS — M51369 Other intervertebral disc degeneration, lumbar region without mention of lumbar back pain or lower extremity pain: Secondary | ICD-10-CM

## 2021-12-05 DIAGNOSIS — G8929 Other chronic pain: Secondary | ICD-10-CM

## 2021-12-05 DIAGNOSIS — K219 Gastro-esophageal reflux disease without esophagitis: Secondary | ICD-10-CM

## 2021-12-05 NOTE — Patient Instructions (Addendum)
STOP FLORINEF!!

## 2021-12-05 NOTE — Progress Notes (Signed)
Established patient visit  I,April Miller,acting as a scribe for Wilhemena Durie, MD.,have documented all relevant documentation on the behalf of Wilhemena Durie, MD,as directed by  Wilhemena Durie, MD while in the presence of Wilhemena Durie, MD.   Patient: Daniel [name]   DOB: [birth year]   71 y.o. Male  MRN: [id number] Visit Date: 12/05/2021  Today's healthcare provider: Wilhemena Durie, MD   Chief Complaint  Patient presents with   Follow-up   Hypertension   Subjective    HPI  Patient's old dog just died and he is upset about this so this is impacting how he is feeling today.  Did not get much sleep over the weekend. He may feel a little better since starting the midodrine.  Diastolic blood pressure was above 90 when he left home this morning. He is followed by Dr. Randa Evens and is from physiatry for chronic back pain.  He states that physical therapy was probably helping him before but he stopped it prematurely.  He is to get back to physical therapy.  Hypertension, follow-up  BP Readings from Last 3 Encounters:  12/05/21 (!) 155/84  11/23/21 90/60  11/15/21 92/66   Wt Readings from Last 3 Encounters:  12/05/21 212 lb (96.2 kg)  11/23/21 217 lb (98.4 kg)  11/15/21 216 lb (98 kg)     He was last seen for hypertension 10 days ago.  BP at that visit was as above. Management since that visit includes starting Midodrine 2.5 mg BID..   Outside blood pressures are normal to high.  Pertinent labs Lab Results  Component Value Date   CHOL 228 (H) 08/24/2021   HDL 63 08/24/2021   LDLCALC 150 (H) 08/24/2021   TRIG 84 08/24/2021   CHOLHDL 3.6 08/24/2021   Lab Results  Component Value Date   NA 137 11/16/2021   K 4.3 11/16/2021   CREATININE 1.03 11/16/2021   EGFR 77 11/16/2021   GLUCOSE 93 11/16/2021   TSH 1.860 11/16/2021     The 10-year ASCVD risk score (Arnett DK, et al., 2019) is:  26.7%  ---------------------------------------------------------------------------------------------------   Medications: Outpatient Medications Prior to Visit  Medication Sig   albuterol (VENTOLIN HFA) 108 (90 Base) MCG/ACT inhaler Inhale 2 puffs into the lungs every 6 (six) hours as needed for wheezing or shortness of breath.   ELIQUIS 5 MG TABS tablet Take 5 mg by mouth 2 (two) times daily.   Famotidine (PEPCID PO) Take 1 tablet by mouth daily.   fludrocortisone (FLORINEF) 0.1 MG tablet Take 1 tablet (0.1 mg total) by mouth daily.   fluticasone (FLONASE) 50 MCG/ACT nasal spray Place 2 sprays into both nostrils daily.   gabapentin (NEURONTIN) 100 MG capsule Take 100 mg by mouth at bedtime.   levothyroxine (SYNTHROID) 100 MCG tablet TAKE 1 TABLET BY MOUTH EVERY DAY BEFORE BREAKFAST   methocarbamol (ROBAXIN) 500 MG tablet Take 1 tablet (500 mg total) by mouth 3 (three) times daily.   metoprolol succinate (TOPROL-XL) 25 MG 24 hr tablet Take 25 mg by mouth daily.   midodrine (PROAMATINE) 2.5 MG tablet Take 1 tablet (2.5 mg total) by mouth 2 (two) times daily with a meal.   Multiple Vitamin (MULTIVITAMIN) capsule Take 1 capsule daily by mouth.   pantoprazole (PROTONIX) 40 MG tablet TAKE 1 TABLET(40 MG) BY MOUTH TWICE DAILY   No facility-administered medications prior to visit.    Review of Systems  Last CBC Lab Results  Component Value  Date   WBC 5.9 11/16/2021   HGB 16.9 11/16/2021   HCT 48.4 11/16/2021   MCV 86 11/16/2021   MCH 30.1 11/16/2021   RDW 12.8 11/16/2021   PLT 258 11/16/2021       Objective    BP (!) 155/84 (BP Location: Left Arm, Patient Position: Sitting, Cuff Size: Large)   Pulse (!) 58   Resp 16   Ht '6\' 2"'  (1.88 m)   Wt 212 lb (96.2 kg)   SpO2 99%   BMI 27.22 kg/m  BP Readings from Last 3 Encounters:  12/05/21 (!) 155/84  11/23/21 90/60  11/15/21 92/66   Wt Readings from Last 3 Encounters:  12/05/21 212 lb (96.2 kg)  11/23/21 217 lb (98.4 kg)   11/15/21 216 lb (98 kg)      Physical Exam Vitals reviewed.  Constitutional:      General: He is not in acute distress.    Appearance: He is well-developed.  HENT:     Head: Normocephalic and atraumatic.     Right Ear: Hearing normal.     Left Ear: Hearing normal.     Nose: Nose normal.  Eyes:     General: Lids are normal. No scleral icterus.       Right eye: No discharge.        Left eye: No discharge.     Conjunctiva/sclera: Conjunctivae normal.  Cardiovascular:     Rate and Rhythm: Normal rate and regular rhythm.     Heart sounds: Normal heart sounds.  Pulmonary:     Effort: Pulmonary effort is normal. No respiratory distress.  Skin:    Findings: No lesion or rash.  Neurological:     General: No focal deficit present.     Mental Status: He is alert and oriented to person, place, and time.  Psychiatric:        Mood and Affect: Mood normal.        Speech: Speech normal.        Behavior: Behavior normal.        Thought Content: Thought content normal.        Judgment: Judgment normal.       No results found for any visits on 12/05/21.  Assessment & Plan      1. Idiopathic hypotension Stop Florinef.  Follow-up 2 to 3 months.  Bring in home blood pressure readings. 2. DDD (degenerative disc disease), lumbar Followed by physiatry/Dr. Sharlet Salina  3. Chronic midline low back pain without sciatica Resume physical therapy  4. Malaise I think part of is related to his hypotension. We will reassess on his next visit. I think he also has some mild underlying chronic depression as he has lost 2 wives at a relatively young age.  5. Gastroesophageal reflux disease without esophagitis    Return in about 3 months (around 03/07/2022).      I, Wilhemena Durie, MD, have reviewed all documentation for this visit. The documentation on 12/05/21 for the exam, diagnosis, procedures, and orders are all accurate and complete.    Nishanth Mccaughan Cranford Mon, MD  [facility] [phone] (phone) [phone] (fax)  [place]

## 2021-12-07 ENCOUNTER — Ambulatory Visit: Payer: Medicare Other | Admitting: Physical Therapy

## 2021-12-07 DIAGNOSIS — I480 Paroxysmal atrial fibrillation: Secondary | ICD-10-CM | POA: Diagnosis not present

## 2021-12-07 DIAGNOSIS — R001 Bradycardia, unspecified: Secondary | ICD-10-CM | POA: Diagnosis not present

## 2021-12-12 ENCOUNTER — Encounter: Payer: Self-pay | Admitting: *Deleted

## 2021-12-13 ENCOUNTER — Ambulatory Visit: Payer: Medicare Other | Admitting: Anesthesiology

## 2021-12-13 ENCOUNTER — Ambulatory Visit
Admission: RE | Admit: 2021-12-13 | Discharge: 2021-12-13 | Disposition: A | Discharge status: Home / Self Care | Payer: Medicare Other | Attending: Gastroenterology | Admitting: Gastroenterology

## 2021-12-13 ENCOUNTER — Other Ambulatory Visit: Payer: Self-pay

## 2021-12-13 ENCOUNTER — Ambulatory Visit: Payer: Medicare Other | Admitting: Physical Therapy

## 2021-12-13 ENCOUNTER — Encounter: Payer: Self-pay | Admitting: *Deleted

## 2021-12-13 ENCOUNTER — Encounter
Admission: RE | Disposition: A | Discharge status: Home / Self Care | Payer: Self-pay | Source: Home / Self Care | Attending: Gastroenterology

## 2021-12-13 DIAGNOSIS — Z1211 Encounter for screening for malignant neoplasm of colon: Secondary | ICD-10-CM | POA: Diagnosis not present

## 2021-12-13 DIAGNOSIS — Z7901 Long term (current) use of anticoagulants: Secondary | ICD-10-CM | POA: Diagnosis not present

## 2021-12-13 DIAGNOSIS — Z9049 Acquired absence of other specified parts of digestive tract: Secondary | ICD-10-CM | POA: Diagnosis not present

## 2021-12-13 DIAGNOSIS — Z86010 Personal history of colonic polyps: Secondary | ICD-10-CM | POA: Insufficient documentation

## 2021-12-13 DIAGNOSIS — I4891 Unspecified atrial fibrillation: Secondary | ICD-10-CM | POA: Diagnosis not present

## 2021-12-13 DIAGNOSIS — I950 Idiopathic hypotension: Secondary | ICD-10-CM | POA: Diagnosis not present

## 2021-12-13 DIAGNOSIS — Z800 Family history of malignant neoplasm of digestive organs: Secondary | ICD-10-CM | POA: Diagnosis not present

## 2021-12-13 DIAGNOSIS — E039 Hypothyroidism, unspecified: Secondary | ICD-10-CM | POA: Diagnosis not present

## 2021-12-13 DIAGNOSIS — K5730 Diverticulosis of large intestine without perforation or abscess without bleeding: Secondary | ICD-10-CM | POA: Insufficient documentation

## 2021-12-13 DIAGNOSIS — K635 Polyp of colon: Secondary | ICD-10-CM | POA: Diagnosis not present

## 2021-12-13 DIAGNOSIS — D122 Benign neoplasm of ascending colon: Secondary | ICD-10-CM | POA: Diagnosis not present

## 2021-12-13 DIAGNOSIS — J45909 Unspecified asthma, uncomplicated: Secondary | ICD-10-CM | POA: Insufficient documentation

## 2021-12-13 DIAGNOSIS — D123 Benign neoplasm of transverse colon: Secondary | ICD-10-CM | POA: Diagnosis not present

## 2021-12-13 DIAGNOSIS — Z09 Encounter for follow-up examination after completed treatment for conditions other than malignant neoplasm: Secondary | ICD-10-CM | POA: Diagnosis present

## 2021-12-13 DIAGNOSIS — K640 First degree hemorrhoids: Secondary | ICD-10-CM | POA: Diagnosis not present

## 2021-12-13 DIAGNOSIS — Z87891 Personal history of nicotine dependence: Secondary | ICD-10-CM | POA: Insufficient documentation

## 2021-12-13 DIAGNOSIS — K219 Gastro-esophageal reflux disease without esophagitis: Secondary | ICD-10-CM | POA: Diagnosis not present

## 2021-12-13 DIAGNOSIS — T7840XA Allergy, unspecified, initial encounter: Secondary | ICD-10-CM | POA: Diagnosis not present

## 2021-12-13 HISTORY — PX: COLONOSCOPY WITH PROPOFOL: SHX5780

## 2021-12-13 SURGERY — COLONOSCOPY WITH PROPOFOL
Anesthesia: General

## 2021-12-13 MED ORDER — SODIUM CHLORIDE 0.9 % IV SOLN
INTRAVENOUS | Status: DC
Start: 2021-12-13 — End: 2021-12-13

## 2021-12-13 MED ORDER — PROPOFOL 10 MG/ML IV BOLUS
INTRAVENOUS | Status: AC
  Filled 2021-12-13: qty 60

## 2021-12-13 MED ORDER — PROPOFOL 500 MG/50ML IV EMUL
INTRAVENOUS | Status: DC | PRN
  Administered 2021-12-13: 50 mg via INTRAVENOUS
  Administered 2021-12-13: 120 ug/kg/min via INTRAVENOUS

## 2021-12-13 MED ORDER — EPHEDRINE SULFATE (PRESSORS) 50 MG/ML IJ SOLN
INTRAMUSCULAR | Status: DC | PRN
  Administered 2021-12-13: 5 mg via INTRAVENOUS

## 2021-12-13 NOTE — Transfer of Care (Signed)
Immediate Anesthesia Transfer of Care Note  Patient: Daniel [name]  Procedure(s) Performed: COLONOSCOPY WITH PROPOFOL  Patient Location: PACU  Anesthesia Type:General  Level of Consciousness: awake  Airway & Oxygen Therapy: Patient Spontanous Breathing and Patient connected to nasal cannula oxygen  Post-op Assessment: Report given to RN and Post -op Vital signs reviewed and stable  Post vital signs: Reviewed and stable  Last Vitals:  Vitals Value Taken Time  BP 100/78 12/13/21 1211  Temp 36.1 C 12/13/21 1206  Pulse 60 12/13/21 1211  Resp 13 12/13/21 1211  SpO2 98 % 12/13/21 1211  Vitals shown include unvalidated device data.  Last Pain:  Vitals:   12/13/21 1206  TempSrc: Temporal  PainSc: Asleep         Complications: No notable events documented.

## 2021-12-13 NOTE — H&P (Signed)
Outpatient short stay form Pre-procedure 12/13/2021  [name], MD  Primary Physician: Jerrol Banana., MD  Reason for visit:  Surveillance  History of present illness:    71 y/o gentleman with history of NAFLD, hypothyroidism, and  a. Fib on doac here for surveillance colonoscopy. Last dose of eliquis was 3 days ago. Last colonoscopy in 2019. History of colon resection for diverticulitis. Mother with colon cancer in her 53's.    Current Facility-Administered Medications:    0.9 %  sodium chloride infusion, , Intravenous, Continuous, Solange Emry, Hilton Cork, MD, Last Rate: 20 mL/hr at 12/13/21 1031, New Bag at 12/13/21 1031  Medications Prior to Admission  Medication Sig Dispense Refill Last Dose   albuterol (VENTOLIN HFA) 108 (90 Base) MCG/ACT inhaler Inhale 2 puffs into the lungs every 6 (six) hours as needed for wheezing or shortness of breath. 8 g 2 Past Week   Famotidine (PEPCID PO) Take 1 tablet by mouth daily.   Past Week   fludrocortisone (FLORINEF) 0.1 MG tablet Take 1 tablet (0.1 mg total) by mouth daily. 30 tablet 11 Past Week   fluticasone (FLONASE) 50 MCG/ACT nasal spray Place 2 sprays into both nostrils daily. 16 g 6 Past Week   levothyroxine (SYNTHROID) 100 MCG tablet TAKE 1 TABLET BY MOUTH EVERY DAY BEFORE BREAKFAST 90 tablet 1 12/13/2021 at 0400   methocarbamol (ROBAXIN) 500 MG tablet Take 1 tablet (500 mg total) by mouth 3 (three) times daily. 90 tablet 1 Past Week   metoprolol succinate (TOPROL-XL) 25 MG 24 hr tablet Take 25 mg by mouth daily.   12/13/2021 at 0400   ELIQUIS 5 MG TABS tablet Take 5 mg by mouth 2 (two) times daily.   12/10/2021   gabapentin (NEURONTIN) 100 MG capsule Take 100 mg by mouth at bedtime.   12/10/2021   midodrine (PROAMATINE) 2.5 MG tablet Take 1 tablet (2.5 mg total) by mouth 2 (two) times daily with a meal. 60 tablet 2    Multiple Vitamin (MULTIVITAMIN) capsule Take 1 capsule daily by mouth. (Patient not taking: Reported on 12/13/2021)    Not Taking   pantoprazole (PROTONIX) 40 MG tablet TAKE 1 TABLET(40 MG) BY MOUTH TWICE DAILY 180 tablet 3 12/09/2021     Allergies  Allergen Reactions   Ciprofloxacin Hcl Other (See Comments)    Blisters break out   Levaquin [Levofloxacin In D5w]    Sulfa Antibiotics Other (See Comments)    unknown     Past Medical History:  Diagnosis Date   Actinic keratosis    Asthma    Eczema    Hx of dysplastic nevus 06/06/2006   Mid lower back, slight atypia   Hypothyroidism    Seasonal allergies    Thyroid disease     Review of systems:  Otherwise negative.    Physical Exam  Gen: Alert, oriented. Appears stated age.  HEENT: PERRLA. Lungs: No respiratory distress CV: RRR Abd: soft, benign, no masses Ext: No edema    Planned procedures: Proceed with colonoscopy. The patient understands the nature of the planned procedure, indications, risks, alternatives and potential complications including but not limited to bleeding, infection, perforation, damage to internal organs and possible oversedation/side effects from anesthesia. The patient agrees and gives consent to proceed.  Please refer to procedure notes for findings, recommendations and patient disposition/instructions.     [name], MD [facility]enter[facility]

## 2021-12-13 NOTE — Anesthesia Postprocedure Evaluation (Signed)
Anesthesia Post Note  Patient: Daniel [name]  Procedure(s) Performed: COLONOSCOPY WITH PROPOFOL  Patient location during evaluation: Endoscopy Anesthesia Type: General Level of consciousness: awake and alert Pain management: pain level controlled Vital Signs Assessment: post-procedure vital signs reviewed and stable Respiratory status: spontaneous breathing, nonlabored ventilation, respiratory function stable and patient connected to nasal cannula oxygen Cardiovascular status: blood pressure returned to baseline and stable Postop Assessment: no apparent nausea or vomiting Anesthetic complications: no   No notable events documented.   Last Vitals:  Vitals:   12/13/21 1214 12/13/21 1216  BP: (!) 82/53 (!) 71/53  Pulse: 60 60  Resp: 19 12  Temp:    SpO2: 97% 96%    Last Pain:  Vitals:   12/13/21 1214  TempSrc:   PainSc: 0-No pain                 Arita Miss

## 2021-12-13 NOTE — Interval H&P Note (Signed)
History and Physical Interval Note:  12/13/2021 11:40 AM  Daniel [name]  has presented today for surgery, with the diagnosis of H/O Colon Polyps.  The various methods of treatment have been discussed with the patient and family. After consideration of risks, benefits and other options for treatment, the patient has consented to  Procedure(s): COLONOSCOPY WITH PROPOFOL (N/A) as a surgical intervention.  The patient's history has been reviewed, patient examined, no change in status, stable for surgery.  I have reviewed the patient's chart and labs.  Questions were answered to the patient's satisfaction.     [name]  Ok to proceed with colonsocopy

## 2021-12-13 NOTE — Op Note (Signed)
[facility]astroenter[facility] Patient Name: Daniel [name] Procedure Date: 12/13/2021 11:38 AM MRN: [id number] Account #: [id number] Date of Birth: [birth year] Admit Type: Outpatient Age: 71 Room: [facility] ENDO ROOM 1 Gender: Male Note Status: Finalized Instrument Name: Jasper Riling [id number] Procedure:             Colonoscopy Indications:           High risk colon cancer surveillance: Personal history                         of colonic polyps, Last colonoscopy within the past 5                         years Providers:             Andrey Farmer MD, MD Referring MD:          Janine Ores. [name], MD (Referring MD) Medicines:             Monitored Anesthesia Care Complications:         No immediate complications. Estimated blood loss:                         Minimal. Procedure:             Pre-Anesthesia Assessment:                        - Prior to the procedure, a History and Physical was                         performed, and patient medications and allergies were                         reviewed. The patient is competent. The risks and                         benefits of the procedure and the sedation options and                         risks were discussed with the patient. All questions                         were answered and informed consent was obtained.                         Patient identification and proposed procedure were                         verified by the physician, the nurse, the                         anesthesiologist, the anesthetist and the technician                         in the endoscopy suite. Mental Status Examination:                         alert and oriented. Airway Examination: normal  oropharyngeal airway and neck mobility. Respiratory                         Examination: clear to auscultation. CV Examination:                         normal. Prophylactic Antibiotics: The patient does not                          require prophylactic antibiotics. Prior                         Anticoagulants: The patient has taken Eliquis                         (apixaban), last dose was 3 days prior to procedure.                         ASA Grade Assessment: III - A patient with severe                         systemic disease. After reviewing the risks and                         benefits, the patient was deemed in satisfactory                         condition to undergo the procedure. The anesthesia                         plan was to use monitored anesthesia care (MAC).                         Immediately prior to administration of medications,                         the patient was re-assessed for adequacy to receive                         sedatives. The heart rate, respiratory rate, oxygen                         saturations, blood pressure, adequacy of pulmonary                         ventilation, and response to care were monitored                         throughout the procedure. The physical status of the                         patient was re-assessed after the procedure.                        After obtaining informed consent, the colonoscope was                         passed under direct vision. Throughout the procedure,  the patient's blood pressure, pulse, and oxygen                         saturations were monitored continuously. The                         Colonoscope was introduced through the anus and                         advanced to the the cecum, identified by appendiceal                         orifice and ileocecal valve. The colonoscopy was                         performed without difficulty. The patient tolerated                         the procedure well. The quality of the bowel                         preparation was good. Findings:      The perianal and digital rectal examinations were normal.      A 3 mm polyp was found in the ascending colon. The polyp  was sessile.       The polyp was removed with a cold snare. Resection and retrieval were       complete. Estimated blood loss was minimal.      Two sessile polyps were found in the transverse colon. The polyps were 3       to 4 mm in size. These polyps were removed with a cold snare. Resection       was complete, but the polyp tissue was only partially retrieved.       Estimated blood loss was minimal.      A few small-mouthed diverticula were found in the descending colon and       ascending colon.      Internal hemorrhoids were found during retroflexion. The hemorrhoids       were Grade I (internal hemorrhoids that do not prolapse).      The exam was otherwise without abnormality on direct and retroflexion       views. Impression:            - One 3 mm polyp in the ascending colon, removed with                         a cold snare. Resected and retrieved.                        - Two 3 to 4 mm polyps in the transverse colon,                         removed with a cold snare. Complete resection. Partial                         retrieval.                        - Diverticulosis in the descending colon and in the  ascending colon.                        - Internal hemorrhoids.                        - The examination was otherwise normal on direct and                         retroflexion views. Recommendation:        - Discharge patient to home.                        - Resume previous diet.                        - Continue present medications.                        - Await pathology results.                        - Repeat colonoscopy in 3 years for surveillance.                        - Return to referring physician as previously                         scheduled. Procedure Code(s):     --- Professional ---                        [phone], Colonoscopy, flexible; with removal of                         tumor(s), polyp(s), or other lesion(s) by snare                          technique Diagnosis Code(s):     --- Professional ---                        K63.5, Polyp of colon                        Z86.010, Personal history of colonic polyps                        K64.0, First degree hemorrhoids                        K57.30, Diverticulosis of large intestine without                         perforation or abscess without bleeding CPT copyright 2019 [employer]. All rights reserved. The codes documented in this report are preliminary and upon coder review may  be revised to meet current compliance requirements. Andrey Farmer MD, MD 12/13/2021 12:04:37 PM Number of Addenda: 0 Note Initiated On: 12/13/2021 11:38 AM Scope Withdrawal Time: 0 hours 10 minutes 37 seconds  Total Procedure Duration: 0 hours 13 minutes 51 seconds  Estimated Blood Loss:  Estimated blood loss was minimal.      [facility]

## 2021-12-13 NOTE — Anesthesia Preprocedure Evaluation (Addendum)
Anesthesia Evaluation  Patient identified by MRN, date of birth, ID band Patient awake    Reviewed: Allergy & Precautions, NPO status , Patient's Chart, lab work & pertinent test results  Airway Mallampati: II  TM Distance: >3 FB Neck ROM: full    Dental no notable dental hx.    Pulmonary asthma , former smoker,   recent cough   Pulmonary exam normal        Cardiovascular Normal cardiovascular exam+ dysrhythmias Atrial Fibrillation   Recent evaluation by cardiology with starting rate control for afib  Chronic fatigue Idiopathic hypotension  MPS 07/2020: LVEF= 52%  FINDINGS:  Regional wall motion: reveals normal myocardial thickening and wall  motion.  The overall quality of the study is good.   Artifacts noted: no  Left ventricular cavity: normal.  Perfusion Analysis: SPECT images demonstrate homogeneous tracer  distribution throughout the myocardium. Defect type: Normal   07/2021: INTERPRETATION  NORMAL LEFT VENTRICULAR SYSTOLIC FUNCTION  WITH MILD LVH  NORMAL RIGHT VENTRICULAR SYSTOLIC FUNCTION  NO VALVULAR STENOSIS  MILD MR, TR  EF 50-55%     Neuro/Psych negative neurological ROS  negative psych ROS   GI/Hepatic GERD  ,Fatty liver Zenker's (hypopharyngeal) diverticulum   Endo/Other  Hypothyroidism   Renal/GU negative Renal ROS  negative genitourinary   Musculoskeletal   Abdominal Normal abdominal exam  (+)   Peds  Hematology negative hematology ROS (+)   Anesthesia Other Findings Past Medical History: No date: Actinic keratosis No date: Asthma No date: Eczema 06/06/2006: Hx of dysplastic nevus     Comment:  Mid lower back, slight atypia No date: Hypothyroidism No date: Seasonal allergies No date: Thyroid disease  Past Surgical History: No date: CATARACT EXTRACTION No date: COLON SURGERY 11/18/2014: COLONOSCOPY WITH PROPOFOL; N/A     Comment:  Procedure: COLONOSCOPY WITH PROPOFOL;   Surgeon: [name], MD;  Location: [facility] ENDOSCOPY;  Service:               Endoscopy;  Laterality: N/A; No date: EYE SURGERY No date: KNEE SURGERY No date: NASAL SEPTUM SURGERY     Reproductive/Obstetrics negative OB ROS                          Anesthesia Physical Anesthesia Plan  ASA: 3  Anesthesia Plan: General   Post-op Pain Management:    Induction: Intravenous  PONV Risk Score and Plan: Propofol infusion, TIVA and Treatment may vary due to age or medical condition  Airway Management Planned: Natural Airway  Additional Equipment:   Intra-op Plan:   Post-operative Plan:   Informed Consent: I have reviewed the patients History and Physical, chart, labs and discussed the procedure including the risks, benefits and alternatives for the proposed anesthesia with the patient or authorized representative who has indicated his/her understanding and acceptance.     Dental Advisory Given  Plan Discussed with: Anesthesiologist, CRNA and Surgeon  Anesthesia Plan Comments:        Anesthesia Quick Evaluation

## 2021-12-14 ENCOUNTER — Encounter: Payer: Self-pay | Admitting: Gastroenterology

## 2021-12-14 LAB — SURGICAL PATHOLOGY

## 2021-12-20 ENCOUNTER — Encounter: Payer: Self-pay | Admitting: Physical Therapy

## 2021-12-20 ENCOUNTER — Encounter: Payer: Medicare Other | Admitting: Physical Therapy

## 2021-12-20 ENCOUNTER — Ambulatory Visit: Payer: Medicare Other | Attending: Family Medicine | Admitting: Physical Therapy

## 2021-12-20 DIAGNOSIS — M546 Pain in thoracic spine: Secondary | ICD-10-CM | POA: Insufficient documentation

## 2021-12-20 DIAGNOSIS — M5459 Other low back pain: Secondary | ICD-10-CM | POA: Insufficient documentation

## 2021-12-20 DIAGNOSIS — G8929 Other chronic pain: Secondary | ICD-10-CM | POA: Diagnosis not present

## 2021-12-20 DIAGNOSIS — R293 Abnormal posture: Secondary | ICD-10-CM | POA: Insufficient documentation

## 2021-12-20 DIAGNOSIS — M545 Low back pain, unspecified: Secondary | ICD-10-CM | POA: Diagnosis not present

## 2021-12-20 DIAGNOSIS — R262 Difficulty in walking, not elsewhere classified: Secondary | ICD-10-CM | POA: Insufficient documentation

## 2021-12-20 NOTE — Therapy (Signed)
OUTPATIENT PHYSICAL THERAPY THORACOLUMBAR EVALUATION   Patient Name: Daniel [name] MRN: [id number] DOB:December 30, 1950, 71 y.o., male Today's Date: 12/20/2021   PT End of Session - 12/20/21 1722     Visit Number 1    Number of Visits 24    Date for PT Re-Evaluation 03/14/22    Authorization Type [employer] MEDICARE reporting period from 12/20/2021    Progress Note Due on Visit 10    PT Start Time 1555    PT Stop Time 1638    PT Time Calculation (min) 50 min    Activity Tolerance Patient tolerated treatment well;No increased pain    Behavior During Therapy [facility] for tasks assessed/performed             Past Medical History:  Diagnosis Date   Actinic keratosis    Asthma    Eczema    Hx of dysplastic nevus 06/06/2006   Mid lower back, slight atypia   Hypothyroidism    Seasonal allergies    Thyroid disease    Past Surgical History:  Procedure Laterality Date   CATARACT EXTRACTION     COLON SURGERY     COLONOSCOPY WITH PROPOFOL N/A 11/18/2014   Procedure: COLONOSCOPY WITH PROPOFOL;  Surgeon: [name], MD;  Location: [facility];  Service: Endoscopy;  Laterality: N/A;   COLONOSCOPY WITH PROPOFOL N/A 12/13/2021   Procedure: COLONOSCOPY WITH PROPOFOL;  Surgeon: [name], MD;  Location: [facility] ENDOSCOPY;  Service: Endoscopy;  Laterality: N/A;   EYE SURGERY     KNEE SURGERY     NASAL SEPTUM SURGERY     Patient Active Problem List   Diagnosis Date Noted   Zenker's (hypopharyngeal) diverticulum 03/22/2018   Gastroesophageal reflux disease 03/22/2018   Fatty liver 10/09/2017   BPH (benign prostatic hyperplasia) 11/21/2014   Hypothyroidism 11/21/2014   Obesity 11/21/2014   Asthma 11/21/2014   Inguinal hernia 11/21/2014   OA (osteoarthritis) 11/21/2014   H/O adenomatous polyp of colon 10/14/2014    PCP: Jerrol Banana., MD  REFERRING PROVIDER: Allene Dillon, NP  REFERRING DIAG: lumbar DDD (degenerative disc disease, lumbar stenosis with neurogenic  claudication, lumbar radiculitis  Rationale for Evaluation and Treatment: Rehabilitation  THERAPY DIAG:  Other low back pain - Plan: PT plan of care cert/re-cert  Pain in thoracic spine - Plan: PT plan of care cert/re-cert  Abnormal posture - Plan: PT plan of care cert/re-cert  Difficulty in walking, not elsewhere classified - Plan: PT plan of care cert/re-cert  ONSET DATE: Spring 2022  SUBJECTIVE:  SUBJECTIVE STATEMENT: Patient is known to this PT and clinic. He is returning for continued back pain that he previously described as starting on Spring 2022 for no apparent reason. He participated in 26 physical therapy sessions between 12/29/2020 and 06/20/2021 with improvements noted in self reported function and pain control, he did not show significant improvement in lumbar AROM and continued to have limitations in standing and lifting activities.   Patient reports he is now returning to PT because his back is still bothering him in the same way as before. He states it hurts in his low back and upper glutes and then moves up towards his shoulder blades when he is standing. He started gabapentin about 3-4 weeks ago with the expectation for it to build up to full effect in 8 weeks. He was also given two muscle relaxants one for day and one for night (but has not tried the day one yet). He had two sessions of 3 Cortizone shots that helped for a while but wore off. He also has prednisone that he is to take when he goes on a trip where he is going to do a lot of walking like when he goes to [place] (next trip in September). The states his back has stayed the same overall and he felt in PT it was not getting better as fast as he wanted it to, but it has gotten worse since he quit PT because he has felt more stiff. He  requested to come back to PT. His L knee is still bothering him. He has had a cortisone shot in his left knee that helped and they plan to do a gel shot when that wears off and if that doesn't help will talk about surgery. His back pain gets worse the more he is standing and walking. Is having trouble flexing his R hip to get a shoe on it. He had to put his dog down since last episode of care. The fatigue he was having has improved some and hasn't bothered him as much lately. A reason for the fatigue was never identified. He works at exercising daily. Since last episode of PT, patient has been unable to return to cooking at the church and has had to cut down on the activities he can do around the house because of his back pain.   PERTINENT HISTORY:  Patient is a 71 y.o. male who presents to outpatient physical therapy with a referral for medical diagnosis lumbar DDD (degenerative disc disease, lumbar stenosis with neurogenic claudication, lumbar radiculitis. This patient's chief complaints consist of chronic pain in the low back and upper glutes that moves up towards his B scapulae with prolonged standing/walking and with intermittent shooting down his R left to the knee, leading to the following functional deficits: difficulty with anything that requires a lot of standing/walking, cooking for church/wine club, cleaning the house, waiting in line, standing longer than 5-20 min, having conversations with others while standing, yard work, Orthoptist, cleaning, sit <> stand, bending and lifting together, staining deck, shopping, walking more than 2 blocks, social participation, changing positions in the bed, golfing.    Relevant past medical history and comorbidities include asthma, Zenker's (hypopharyngeal) diverticulum, afib, GERD, hx of fatty liver, hypothyroidism, BPH, OA, hx adeomatous polyp of colon, inguinal hernia, L knee pain, hx of dysplastic nevus, colon surgery (7 inches removed of colon),  cataract extraction, L knee pain.  Patient denies hx of cancer, stroke, seizures, lung problem, diabetes, unexplained weight loss,  unexplained changes in bowel or bladder problems, unexplained stumbling or dropping things, spinal surgery. He has been exercising daily.   PAIN:  Are you having pain? Yes: NPRS scale: Current: 0/10,  Best: 0/10, Worst: 9/10. Pain location: hurts in his low back and upper glutes and then moves up towards his shoulder blades, down right leg to the back of the thigh above the knee.  Pain description: achy and stiff Aggravating factors: anything requiring standing or walking, pulling knees to chest without support in supine.  Relieving factors: prednisone, sitting, laying, heat, wearing back belt helps some,   FUNCTIONAL LIMITATIONS: difficulty with anything that requires a lot of standing/walking, cooking for church/wine club, cleaning the house, waiting in line, standing longer than 5-20 min, having conversations with others while standing, yard work, Orthoptist, cleaning, sit <> stand, bending and lifting together, staining deck, shopping, walking more than 2 blocks, social participation, changing positions in the bed, golfing.   PRECAUTIONS: None  WEIGHT BEARING RESTRICTIONS No  FALLS:  Has patient fallen in last 6 months? No  LIVING ENVIRONMENT: Lives with: lives alone No concerns about getting around home safely.   OCCUPATION: Retired  Psychologist, clinical: go to [employer], work in the yard, read, work at [employer] (desk work), wine club, cooking, golf.   PLOF: Independent  PATIENT GOALS: "to get where I am not as stiff and I can do more and with time" stand, cook, cook at church   OBJECTIVE  DIAGNOSTIC FINDINGS:  Lumbar MRI Report from 07/14/2021:  CLINICAL DATA:  Low back pain, worse with standing. Symptoms over the last year.   EXAM: MRI LUMBAR SPINE WITHOUT CONTRAST   TECHNIQUE: Multiplanar, multisequence MR imaging of the lumbar spine  was performed. No intravenous contrast was administered.   COMPARISON:  Radiography 11/30/2020   FINDINGS: Segmentation:  5 lumbar type vertebral bodies.   Alignment:  No malalignment.   Vertebrae: Benign appearing hemangioma within the T12 vertebral body. Discogenic endplate marrow changes throughout the lumbar region, with some active edema at L2-3, L3-4 and L4-5, which could correlate with localized back pain.   Conus medullaris and cauda equina: Conus extends to the L1-2 level. Conus and cauda equina appear normal.   Paraspinal and other soft tissues: Negative   Disc levels:   T11-12: Mild bulging of the disc.  No compressive stenosis.   T12-L1: Unremarkable interspace.   L1-2: Shallow protrusion of the disc with a tiny amount caudally migrated disc material to the left of midline. Minimal indentation of the thecal sac. No compressive stenosis.   L2-3: Loss of disc height. Endplate osteophytes and bulging of the disc more prominent towards the left. Mild stenosis of the left lateral recess that could possibly cause left-sided neural compression. Discogenic endplate edema which could relate to back pain.   L3-4: Loss of disc height. Endplate osteophytes and minimal bulging of the disc. Mild facet hypertrophy on the left. Mild left foraminal stenosis.   L4-5: Loss of disc height. Endplate osteophytes and bulging of the disc. Facet and ligamentous hypertrophy. Mild stenosis of the lateral recesses and foramina but without visible neural compression. Discogenic endplate edema which could relate to back pain   L5-S1: Disc desiccation with mild loss of height. Endplate osteophytes and mild bulging of the disc. No compressive narrowing of the canal or foramina.   IMPRESSION: Degenerative disc disease throughout the lumbar region with loss of disc height. No apparent compressive stenosis of the canal. Discogenic endplate marrow changes without active edema  at  L2-3, L3-4 and L4-5, which could correlate with regional axial back pain.   Left lateral recess and foraminal narrowing at L2-3, left foraminal narrowing at L3-4, and bilateral lateral recess and foraminal narrowing at L4-5. None of the stenoses are severe or are seen to result in definite neural compression.   Shallow left posterolateral disc herniation at L1-2 with caudal migration of a tiny amount of disc material to the left of midline. This does not appear to cause any significant compressive effect upon the neural structures.     Electronically Signed   By: Nelson Chimes M.D.   On: 07/14/2021 23:01  SELF- REPORTED FUNCTION FOTO score: 54/100 (lumbar spine questionnaire)  OBSERVATION/INSPECTION Posture Posture (seated): forward head, rounded shoulders, slumped in sitting.  Posture (standing): ~ 12 degrees trunk stooped from hips, flattened lumbar curve increased thoracic curve.  Anthropometrics Tremor: none Body composition: BMI: 26.1 Muscle bulk: B LE appears WFL Skin: appears WFL Edema: none Functional Mobility Bed mobility: supine <> sit and rolling mod I for increased time/effort.  Transfers: sit <> stand I Gait: grossly WFL with stooped posture for household and short community ambulation. More detailed gait analysis deferred to later date as needed.   SPINE MOTION LUMBAR SPINE AROM *Indicates pain Flexion: fingers to top 1/3 of shins (has not been able to reach farther for a long time, used to be able to reach toes at one point), pain in low back.  Extension: neutral, no pain  Side Flexion:   R 25% ipsilateral back pain.   L 5% ipsilateral back pain, and shot down left leg to lateral knee.  Rotation:  R 25% L 25% pain at right mid back  NEUROLOGICAL Dermatomes L3-S2 appears equal and intact to light.  Deep Tendon Reflexes R/L  2+/2+ Quadriceps reflex (L4) 2+/0+ Achilles reflex (S1)  PERIPHERAL JOINT MOTION (in degrees) PASSIVE RANGE OF MOTION  (PROM) 12/20/2021: B LE appears WFL except lacks L hip extension past neutral and R hip extension limited to approx 10 degrees, mild limitation in B knee extension (R > L). Noted back pain with end range L hip IR and ER at 90 degrees flexion, and anterior R hip pain with end range R hip IR (ROM also limited).   MUSCLE PERFORMANCE (MMT):  *Indicates pain 12/20/21 Date Date  Joint/Motion R/L R/L R/L  Hip     Flexion (L1, L2) 5/4+ / /  Abduction 5/5 / /  Knee     Extension (L3) 5/5 / /  Flexion (S2) 5/5 / /  Ankle/Foot     Dorsiflexion (L4) 5/5 / /  Great toe extension (L5) 4+/4+ / /  Eversion (S1) 5/5 / /  Plantarflexion (S1) 5/5 / /  Comments:  12/20/2021: able to heel and toe walk with B UE support with diminished plantar flexion B LE.   SPECIAL TESTS:  LOWER LIMB NEURODYNAMIC TESTS Straight Leg Raise (Sciatic nerve)  R  = negative but ROM limited to approx 45 degrees.   L  = negative but ROM limited to approx 45 degrees.   HIP SPECIAL TESTS FABER: R = positive for back and lateral hip pain, L = positive for back pain.   ACCESSORY MOTION: Reproduction of concordant pain over T8, T9, and lower lumbar segments, worst at base of spine with radiation to B hips.   PALPATION: TTP L glute med, just posterior and medial to greater trochanter.  TTP R SIJ, just posterior and medial to greater trochanter  SUSTAINED POSITIONS  TESTING:  Comfortable in sustained prone lying.   REPEATED MOTIONS TESTING: Prone press up 1x10 (alble to come half way up with stiffness throughout low back, reports if he tries to come higher he gets pain near T8-9).   FUNCTIONAL/BALANCE TESTS: Laying in supine reproduces low back pain, hooklying relieves it.   TODAY'S TREATMENT  education   PATIENT EDUCATION:  Education details: Education on diagnosis, prognosis, POC, anatomy and physiology of current condition.  Person educated: Patient Education method: Explanation Education comprehension: verbalized  understanding and needs further education   HOME EXERCISE PROGRAM: TBD  ASSESSMENT:  CLINICAL IMPRESSION: Patient is a 71 y.o. male referred to outpatient physical therapy with a medical diagnosis of lumbar DDD (degenerative disc disease, lumbar stenosis with neurogenic claudication, lumbar radiculitis who presents with signs and symptoms consistent with chronic low back and thoracic spine pain and stiffness with chronic kyphotic postural changes, hip extension weakness, and intermittent radicular symptoms contributing. Patient's pattern of symptoms suggests sustained spinal extension intolerance and difficulty with postural endurance is likely contributing to condition. Previous episode of care was able to improve patient's quality of life some with decreased pain and improved function and patient has potential for improvement from his current state in these areas. Patient presents with significant pain, joint stiffness, posture, ROM, motor control, muscle performance (strength/power/endurance), and activity tolerance impairments that are limiting ability to complete his usual activities including anything that requires a lot of standing/walking, cooking for church/wine club, cleaning the house, waiting in line, standing longer than 5-20 min, having conversations with others while standing, yard work, Orthoptist, cleaning, sit <> stand, bending and lifting together, staining deck, shopping, walking more than 2 blocks, social participation, changing positions in the bed, golfing without difficulty. Patient will benefit from skilled physical therapy intervention to address current body structure impairments and activity limitations to improve function and work towards goals set in current POC in order to return to prior level of function or maximal functional improvement.    OBJECTIVE IMPAIRMENTS Abnormal gait, decreased activity tolerance, decreased endurance, decreased mobility, difficulty  walking, decreased ROM, decreased strength, hypomobility, impaired perceived functional ability, increased muscle spasms, impaired flexibility, improper body mechanics, postural dysfunction, and pain.   ACTIVITY LIMITATIONS carrying, lifting, bending, standing, bed mobility, dressing, and locomotion level  PARTICIPATION LIMITATIONS: meal prep, cleaning, laundry, interpersonal relationship, shopping, community activity, yard work, church, and   asthma, Zenker's (hypopharyngeal) diverticulum, afib, GERD, hx of fatty liver, hypothyroidism, BPH, OA, hx adeomatous polyp of colon, inguinal hernia, L knee pain, hx of dysplastic nevus, colon surgery (7 inches removed of colon), cataract extraction, L knee pain  PERSONAL FACTORS Age, Past/current experiences, Time since onset of injury/illness/exacerbation, and 3+ comorbidities:   asthma, Zenker's (hypopharyngeal) diverticulum, afib, GERD, hx of fatty liver, hypothyroidism, BPH, OA, hx adeomatous polyp of colon, inguinal hernia, L knee pain, hx of dysplastic nevus, colon surgery (7 inches removed of colon), cataract extraction, L knee pain are also affecting patient's functional outcome.   REHAB POTENTIAL: Good  CLINICAL DECISION MAKING: Stable/uncomplicated  EVALUATION COMPLEXITY: Low   GOALS: Goals reviewed with patient? No  SHORT TERM GOALS: Target date: 01/03/2022  Patient will be independent with initial home exercise program for self-management of symptoms. Baseline: Initial HEP to be provided at visit 2 as appropriate (12/20/21); Goal status: INITIAL   LONG TERM GOALS: Target date: 03/14/2022  Patient will be independent with a long-term home exercise program for self-management of symptoms.  Baseline: Initial HEP to be provided at  visit 2 as appropriate (12/20/21); Goal status: INITIAL  2.  Patient will demonstrate improved FOTO to equal or greater than 67 by visit #11 to demonstrate improvement in overall condition and self-reported  functional ability.  Baseline: 54 (12/20/21); Goal status: INITIAL  3.  Patient will ambulate equal or greater than 1200 feet on 6 Minute Walk Test with no increase in symptoms to demonstrate improved community ambulation.  Baseline: to be tested visit 2 as appropriate (12/20/21); Goal status: INITIAL  4.  Patient will demonstrate the ability to lift 25# box floor to waist 1x10 times in a row with no increase in symptoms to improve his ability to bend and lift things around his home.  Baseline: reports difficulty with bending and lifting items at home  (12/20/21); Goal status: INITIAL  5.  Patient will report the ability to stand/walk equal or greater than 60 min before needing to sit to improve his ability to stand in line, socialize, and cook.  Baseline: 5-20 min (12/20/21); Goal status: INITIAL   PLAN: PT FREQUENCY: 1-2x/week  PT DURATION: 12 weeks  PLANNED INTERVENTIONS: Therapeutic exercises, Therapeutic activity, Neuromuscular re-education, Patient/Family education, Joint mobilization, DME instructions, Dry Needling, Electrical stimulation, Spinal mobilization, Cryotherapy, Moist heat, Manual therapy, and Re-evaluation.  PLAN FOR NEXT SESSION: 6MWT, update HEP as appropriate, manual therapy and exercises for improved joint mobility in the spine.    Everlean Alstrom. Graylon Good, PT, DPT 12/20/21, 5:25 PM  [facility] [address] [place], [place] [address] P: [phone] I F: [phone]

## 2021-12-26 ENCOUNTER — Ambulatory Visit: Payer: Medicare Other

## 2021-12-26 DIAGNOSIS — R262 Difficulty in walking, not elsewhere classified: Secondary | ICD-10-CM | POA: Diagnosis not present

## 2021-12-26 DIAGNOSIS — M546 Pain in thoracic spine: Secondary | ICD-10-CM | POA: Diagnosis not present

## 2021-12-26 DIAGNOSIS — G8929 Other chronic pain: Secondary | ICD-10-CM | POA: Diagnosis not present

## 2021-12-26 DIAGNOSIS — M545 Low back pain, unspecified: Secondary | ICD-10-CM | POA: Diagnosis not present

## 2021-12-26 DIAGNOSIS — M5459 Other low back pain: Secondary | ICD-10-CM | POA: Diagnosis not present

## 2021-12-26 DIAGNOSIS — R293 Abnormal posture: Secondary | ICD-10-CM | POA: Diagnosis not present

## 2021-12-26 NOTE — Therapy (Signed)
OUTPATIENT PHYSICAL THERAPY THORACOLUMBAR TREATMENT   Patient Name: Daniel [name] MRN: [id number] DOB:January 16, 1951, 71 y.o., male Today's Date: 12/26/2021   PT End of Session - 12/26/21 1602     Visit Number 2    Number of Visits 24    Date for PT Re-Evaluation 03/14/22    Authorization Type [employer] MEDICARE reporting period from 12/20/2021    Progress Note Due on Visit 10    PT Start Time 1600    PT Stop Time 1642    PT Time Calculation (min) 42 min    Activity Tolerance Patient tolerated treatment well;No increased pain    Behavior During Therapy [facility] for tasks assessed/performed             Past Medical History:  Diagnosis Date   Actinic keratosis    Asthma    Eczema    Hx of dysplastic nevus 06/06/2006   Mid lower back, slight atypia   Hypothyroidism    Seasonal allergies    Thyroid disease    Past Surgical History:  Procedure Laterality Date   CATARACT EXTRACTION     COLON SURGERY     COLONOSCOPY WITH PROPOFOL N/A 11/18/2014   Procedure: COLONOSCOPY WITH PROPOFOL;  Surgeon: [name], MD;  Location: [facility];  Service: Endoscopy;  Laterality: N/A;   COLONOSCOPY WITH PROPOFOL N/A 12/13/2021   Procedure: COLONOSCOPY WITH PROPOFOL;  Surgeon: [name], MD;  Location: [facility] END[facility];  Service: Endoscopy;  Laterality: N/A;   EYE SURGERY     KNEE SURGERY     NASAL SEPTUM SURGERY     Patient Active Problem List   Diagnosis Date Noted   Zenker's (hypopharyngeal) diverticulum 03/22/2018   Gastroesophageal reflux disease 03/22/2018   Fatty liver 10/09/2017   BPH (benign prostatic hyperplasia) 11/21/2014   Hypothyroidism 11/21/2014   Obesity 11/21/2014   Asthma 11/21/2014   Inguinal hernia 11/21/2014   OA (osteoarthritis) 11/21/2014   H/O adenomatous polyp of colon 10/14/2014    PCP: Jerrol Banana., MD  REFERRING PROVIDER: Allene Dillon, NP  REFERRING DIAG: lumbar DDD (degenerative disc disease, lumbar stenosis with neurogenic  claudication, lumbar radiculitis  Rationale for Evaluation and Treatment: Rehabilitation  THERAPY DIAG:  Other low back pain  Pain in thoracic spine  Abnormal posture  Difficulty in walking, not elsewhere classified  Chronic bilateral low back pain, unspecified whether sciatica present  ONSET DATE: Spring 2022  SUBJECTIVE:  SUBJECTIVE STATEMENT: Pt reports no pain in low back. Had a "bad day" Saturday with back pain due to thinking he slept wrong on his back.   PERTINENT HISTORY:  Patient is a 71 y.o. male who presents to outpatient physical therapy with a referral for medical diagnosis lumbar DDD (degenerative disc disease, lumbar stenosis with neurogenic claudication, lumbar radiculitis. This patient's chief complaints consist of chronic pain in the low back and upper glutes that moves up towards his B scapulae with prolonged standing/walking and with intermittent shooting down his R left to the knee, leading to the following functional deficits: difficulty with anything that requires a lot of standing/walking, cooking for church/wine club, cleaning the house, waiting in line, standing longer than 5-20 min, having conversations with others while standing, yard work, Orthoptist, cleaning, sit <> stand, bending and lifting together, staining deck, shopping, walking more than 2 blocks, social participation, changing positions in the bed, golfing.    Relevant past medical history and comorbidities include asthma, Zenker's (hypopharyngeal) diverticulum, afib, GERD, hx of fatty liver, hypothyroidism, BPH, OA, hx adeomatous polyp of colon, inguinal hernia, L knee pain, hx of dysplastic nevus, colon surgery (7 inches removed of colon), cataract extraction, L knee pain.  Patient denies hx of cancer,  stroke, seizures, lung problem, diabetes, unexplained weight loss, unexplained changes in bowel or bladder problems, unexplained stumbling or dropping things, spinal surgery. He has been exercising daily.   PAIN:  Are you having pain? No : NPRS scale: 0/10 Pain location:  Pain description:  Aggravating factors: anything requiring standing or walking, pulling knees to chest without support in supine.  Relieving factors: prednisone, sitting, laying, heat, wearing back belt helps some,   FUNCTIONAL LIMITATIONS: difficulty with anything that requires a lot of standing/walking, cooking for church/wine club, cleaning the house, waiting in line, standing longer than 5-20 min, having conversations with others while standing, yard work, Orthoptist, cleaning, sit <> stand, bending and lifting together, staining deck, shopping, walking more than 2 blocks, social participation, changing positions in the bed, golfing.   PRECAUTIONS: None  WEIGHT BEARING RESTRICTIONS No  FALLS:  Has patient fallen in last 6 months? No  LIVING ENVIRONMENT: Lives with: lives alone No concerns about getting around home safely.   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   PLOF: Independent  PATIENT GOALS: "to get where I am not as stiff and I can do more and with time" stand, cook, cook at church   OBJECTIVE  12/26/21:  There.ex:   Nu-Step L2 for 5 minutes for LE warmup and gentle thoracolumbar mobility   6MWT: 1,225' up to 2/10 NPS            2MWT: 400' Seated lumbar flexion on physioball with R/L lateral deviations: x10/direction. VC's for decreasing speed.  Seated thoracic extensions on  pillow folded: arms across chest, 2x15, VC's for decreasing speed of motion STS with airex pad in seat: limited due to R knee pain with coming to standing limiting full hip and knee extension  Scap retractions: 3x10,  black TB. Given black TB and educated on various, safe technique to anchor  Glut bridge with back on mat table and LE's on floor. 2x10   DIAGNOSTIC FINDINGS:  Lumbar MRI Report from 07/14/2021:  CLINICAL DATA:  Low back pain, worse with standing. Symptoms over the last year.   EXAM: MRI LUMBAR SPINE WITHOUT CONTRAST  TECHNIQUE: Multiplanar, multisequence MR imaging of the lumbar spine was performed. No intravenous contrast was administered.   COMPARISON:  Radiography 11/30/2020   FINDINGS: Segmentation:  5 lumbar type vertebral bodies.   Alignment:  No malalignment.   Vertebrae: Benign appearing hemangioma within the T12 vertebral body. Discogenic endplate marrow changes throughout the lumbar region, with some active edema at L2-3, L3-4 and L4-5, which could correlate with localized back pain.   Conus medullaris and cauda equina: Conus extends to the L1-2 level. Conus and cauda equina appear normal.   Paraspinal and other soft tissues: Negative   Disc levels:   T11-12: Mild bulging of the disc.  No compressive stenosis.   T12-L1: Unremarkable interspace.   L1-2: Shallow protrusion of the disc with a tiny amount caudally migrated disc material to the left of midline. Minimal indentation of the thecal sac. No compressive stenosis.   L2-3: Loss of disc height. Endplate osteophytes and bulging of the disc more prominent towards the left. Mild stenosis of the left lateral recess that could possibly cause left-sided neural compression. Discogenic endplate edema which could relate to back pain.   L3-4: Loss of disc height. Endplate osteophytes and minimal bulging of the disc. Mild facet hypertrophy on the left. Mild left foraminal stenosis.   L4-5: Loss of disc height. Endplate osteophytes and bulging of the disc. Facet and ligamentous hypertrophy. Mild stenosis of the lateral recesses and foramina but without visible neural compression. Discogenic endplate edema  which could relate to back pain   L5-S1: Disc desiccation with mild loss of height. Endplate osteophytes and mild bulging of the disc. No compressive narrowing of the canal or foramina.   IMPRESSION: Degenerative disc disease throughout the lumbar region with loss of disc height. No apparent compressive stenosis of the canal. Discogenic endplate marrow changes without active edema at L2-3, L3-4 and L4-5, which could correlate with regional axial back pain.   Left lateral recess and foraminal narrowing at L2-3, left foraminal narrowing at L3-4, and bilateral lateral recess and foraminal narrowing at L4-5. None of the stenoses are severe or are seen to result in definite neural compression.   Shallow left posterolateral disc herniation at L1-2 with caudal migration of a tiny amount of disc material to the left of midline. This does not appear to cause any significant compressive effect upon the neural structures.     Electronically Signed   By: Nelson Chimes M.D.   On: 07/14/2021 23:01  SELF- REPORTED FUNCTION FOTO score: 54/100 (lumbar spine questionnaire)  OBSERVATION/INSPECTION Posture Posture (seated): forward head, rounded shoulders, slumped in sitting.  Posture (standing): ~ 12 degrees trunk stooped from hips, flattened lumbar curve increased thoracic curve.  Anthropometrics Tremor: none Body composition: BMI: 26.1 Muscle bulk: B LE appears WFL Skin: appears WFL Edema: none Functional Mobility Bed mobility: supine <> sit and rolling mod I for increased time/effort.  Transfers: sit <> stand I Gait: grossly WFL with stooped posture for household and short community ambulation. More detailed gait analysis deferred to later date as needed.   SPINE MOTION LUMBAR SPINE AROM *Indicates pain Flexion: fingers to top 1/3 of shins (has not been able to reach farther for a long time, used to be able to reach toes at one point), pain in low back.  Extension: neutral, no pain   Side Flexion:   R 25% ipsilateral back pain.   L 5% ipsilateral back pain, and shot down left leg to lateral knee.  Rotation:  R 25% L 25%  pain at right mid back  NEUROLOGICAL Dermatomes L3-S2 appears equal and intact to light.  Deep Tendon Reflexes R/L  2+/2+ Quadriceps reflex (L4) 2+/0+ Achilles reflex (S1)  PERIPHERAL JOINT MOTION (in degrees) PASSIVE RANGE OF MOTION (PROM) 12/20/2021: B LE appears WFL except lacks L hip extension past neutral and R hip extension limited to approx 10 degrees, mild limitation in B knee extension (R > L). Noted back pain with end range L hip IR and ER at 90 degrees flexion, and anterior R hip pain with end range R hip IR (ROM also limited).   MUSCLE PERFORMANCE (MMT):  *Indicates pain 12/20/21 Date Date  Joint/Motion R/L R/L R/L  Hip     Flexion (L1, L2) 5/4+ / /  Abduction 5/5 / /  Knee     Extension (L3) 5/5 / /  Flexion (S2) 5/5 / /  Ankle/Foot     Dorsiflexion (L4) 5/5 / /  Great toe extension (L5) 4+/4+ / /  Eversion (S1) 5/5 / /  Plantarflexion (S1) 5/5 / /  Comments:  12/20/2021: able to heel and toe walk with B UE support with diminished plantar flexion B LE.   SPECIAL TESTS:  LOWER LIMB NEURODYNAMIC TESTS Straight Leg Raise (Sciatic nerve)  R  = negative but ROM limited to approx 45 degrees.   L  = negative but ROM limited to approx 45 degrees.   HIP SPECIAL TESTS FABER: R = positive for back and lateral hip pain, L = positive for back pain.   ACCESSORY MOTION: Reproduction of concordant pain over T8, T9, and lower lumbar segments, worst at base of spine with radiation to B hips.   PALPATION: TTP L glute med, just posterior and medial to greater trochanter.  TTP R SIJ, just posterior and medial to greater trochanter  SUSTAINED POSITIONS TESTING:  Comfortable in sustained prone lying.   REPEATED MOTIONS TESTING: Prone press up 1x10 (alble to come half way up with stiffness throughout low back, reports if he tries to  come higher he gets pain near T8-9).   FUNCTIONAL/BALANCE TESTS: Laying in supine reproduces low back pain, hooklying relieves it.   TODAY'S TREATMENT  education   PATIENT EDUCATION:  Education details: 6MWT results, HEP Person educated: Patient Education method: Explanation Education comprehension: verbalized understanding and needs further education   HOME EXERCISE PROGRAM: TBD  ASSESSMENT:  CLINICAL IMPRESSION: Pt returning to f/u after eval. Pt completed 6MWT of 1,225' with up to 2/10 pain. 6MWT goal updated to reflect accurate measure for future, expected capacity for improvement. Also development of HEP this session. Pt good understand of exercises reporting reduced stiffness post session. Will continue PT POC to progress pain and LE strength to optimize return to PLOF.    From Eval: Patient is a 71 y.o. male referred to outpatient physical therapy with a medical diagnosis of lumbar DDD (degenerative disc disease, lumbar stenosis with neurogenic claudication, lumbar radiculitis who presents with signs and symptoms consistent with chronic low back and thoracic spine pain and stiffness with chronic kyphotic postural changes, hip extension weakness, and intermittent radicular symptoms contributing. Patient's pattern of symptoms suggests sustained spinal extension intolerance and difficulty with postural endurance is likely contributing to condition. Previous episode of care was able to improve patient's quality of life some with decreased pain and improved function and patient has potential for improvement from his current state in these areas. Patient presents with significant pain, joint stiffness, posture, ROM, motor control, muscle performance (strength/power/endurance), and activity tolerance impairments  that are limiting ability to complete his usual activities including anything that requires a lot of standing/walking, cooking for church/wine club, cleaning the house, waiting in  line, standing longer than 5-20 min, having conversations with others while standing, yard work, Orthoptist, cleaning, sit <> stand, bending and lifting together, staining deck, shopping, walking more than 2 blocks, social participation, changing positions in the bed, golfing without difficulty. Patient will benefit from skilled physical therapy intervention to address current body structure impairments and activity limitations to improve function and work towards goals set in current POC in order to return to prior level of function or maximal functional improvement.    OBJECTIVE IMPAIRMENTS Abnormal gait, decreased activity tolerance, decreased endurance, decreased mobility, difficulty walking, decreased ROM, decreased strength, hypomobility, impaired perceived functional ability, increased muscle spasms, impaired flexibility, improper body mechanics, postural dysfunction, and pain.   ACTIVITY LIMITATIONS carrying, lifting, bending, standing, bed mobility, dressing, and locomotion level  PARTICIPATION LIMITATIONS: meal prep, cleaning, laundry, interpersonal relationship, shopping, community activity, yard work, church, and   asthma, Zenker's (hypopharyngeal) diverticulum, afib, GERD, hx of fatty liver, hypothyroidism, BPH, OA, hx adeomatous polyp of colon, inguinal hernia, L knee pain, hx of dysplastic nevus, colon surgery (7 inches removed of colon), cataract extraction, L knee pain  PERSONAL FACTORS Age, Past/current experiences, Time since onset of injury/illness/exacerbation, and 3+ comorbidities:   asthma, Zenker's (hypopharyngeal) diverticulum, afib, GERD, hx of fatty liver, hypothyroidism, BPH, OA, hx adeomatous polyp of colon, inguinal hernia, L knee pain, hx of dysplastic nevus, colon surgery (7 inches removed of colon), cataract extraction, L knee pain are also affecting patient's functional outcome.   REHAB POTENTIAL: Good  CLINICAL DECISION MAKING:  Stable/uncomplicated  EVALUATION COMPLEXITY: Low   GOALS: Goals reviewed with patient? No  SHORT TERM GOALS: Target date: 01/09/2022  Patient will be independent with initial home exercise program for self-management of symptoms. Baseline: Initial HEP to be provided at visit 2 as appropriate (12/26/21); Goal status: INITIAL   LONG TERM GOALS: Target date: 03/20/2022  Patient will be independent with a long-term home exercise program for self-management of symptoms.  Baseline: Initial HEP to be provided at visit 2 as appropriate (12/26/21); Goal status: INITIAL  2.  Patient will demonstrate improved FOTO to equal or greater than 67 by visit #11 to demonstrate improvement in overall condition and self-reported functional ability.  Baseline: 54 (12/26/21); Goal status: INITIAL  3.  Patient will ambulate equal or greater than 202 feet on 6 Minute Walk Test with no increase in symptoms to demonstrate improved community ambulation for ag matched norms for arthritic pain.  Baseline: 1225', 2/10 pain in low back (12/26/21); Goal status: INITIAL  4.  Patient will demonstrate the ability to lift 25# box floor to waist 1x10 times in a row with no increase in symptoms to improve his ability to bend and lift things around his home.  Baseline: reports difficulty with bending and lifting items at home  (12/26/21); Goal status: INITIAL  5.  Patient will report the ability to stand/walk equal or greater than 60 min before needing to sit to improve his ability to stand in line, socialize, and cook.  Baseline: 5-20 min (12/26/21); Goal status: INITIAL   PLAN: PT FREQUENCY: 1-2x/week  PT DURATION: 12 weeks  PLANNED INTERVENTIONS: Therapeutic exercises, Therapeutic activity, Neuromuscular re-education, Patient/Family education, Joint mobilization, DME instructions, Dry Needling, Electrical stimulation, Spinal mobilization, Cryotherapy, Moist heat, Manual therapy, and Re-evaluation.  PLAN FOR  NEXT SESSION: update HEP as appropriate, manual  therapy and exercises for improved joint mobility in the spine.   Salem Caster. Fairly IV, PT, DPT Physical Therapist- [facility]  12/26/21, 5:30 PM  [facility] [address] [place], [place] [address] P: [phone] I F: [phone]

## 2021-12-28 ENCOUNTER — Encounter: Payer: Self-pay | Admitting: Physical Therapy

## 2021-12-28 ENCOUNTER — Ambulatory Visit: Payer: Medicare Other | Attending: Family Medicine | Admitting: Physical Therapy

## 2021-12-28 DIAGNOSIS — M546 Pain in thoracic spine: Secondary | ICD-10-CM | POA: Diagnosis not present

## 2021-12-28 DIAGNOSIS — R262 Difficulty in walking, not elsewhere classified: Secondary | ICD-10-CM | POA: Diagnosis not present

## 2021-12-28 DIAGNOSIS — M5459 Other low back pain: Secondary | ICD-10-CM | POA: Insufficient documentation

## 2021-12-28 DIAGNOSIS — R293 Abnormal posture: Secondary | ICD-10-CM | POA: Diagnosis not present

## 2021-12-28 NOTE — Therapy (Signed)
OUTPATIENT PHYSICAL THERAPY TREATMENT NOTE   Patient Name: Daniel [name] MRN: [id number] DOB:01/03/1951, 71 y.o., male Today's Date: 12/28/2021  PCP: Jerrol Banana., MD REFERRING PROVIDER: Allene Dillon, NP  END OF SESSION:   PT End of Session - 12/28/21 1646     Visit Number 3    Number of Visits 24    Date for PT Re-Evaluation 03/14/22    Authorization Type [employer] MEDICARE reporting period from 12/20/2021    Progress Note Due on Visit 10    PT Start Time 1607    PT Stop Time 0737    PT Time Calculation (min) 40 min    Activity Tolerance Patient tolerated treatment well;No increased pain    Behavior During Therapy [facility] for tasks assessed/performed             Past Medical History:  Diagnosis Date   Actinic keratosis    Asthma    Eczema    Hx of dysplastic nevus 06/06/2006   Mid lower back, slight atypia   Hypothyroidism    Seasonal allergies    Thyroid disease    Past Surgical History:  Procedure Laterality Date   CATARACT EXTRACTION     COLON SURGERY     COLONOSCOPY WITH PROPOFOL N/A 11/18/2014   Procedure: COLONOSCOPY WITH PROPOFOL;  Surgeon: [name], MD;  Location: [place];  Service: Endoscopy;  Laterality: N/A;   COLONOSCOPY WITH PROPOFOL N/A 12/13/2021   Procedure: COLONOSCOPY WITH PROPOFOL;  Surgeon: [name], MD;  Location: [facility];  Service: Endoscopy;  Laterality: N/A;   EYE SURGERY     KNEE SURGERY     NASAL SEPTUM SURGERY     Patient Active Problem List   Diagnosis Date Noted   Zenker's (hypopharyngeal) diverticulum 03/22/2018   Gastroesophageal reflux disease 03/22/2018   Fatty liver 10/09/2017   BPH (benign prostatic hyperplasia) 11/21/2014   Hypothyroidism 11/21/2014   Obesity 11/21/2014   Asthma 11/21/2014   Inguinal hernia 11/21/2014   OA (osteoarthritis) 11/21/2014   H/O adenomatous polyp of colon 10/14/2014    REFERRING DIAG: lumbar DDD (degenerative disc disease), lumbar stenosis with  neurogenic claudication, lumbar radiculitis  THERAPY DIAG:  Other low back pain  Pain in thoracic spine  Abnormal posture  Difficulty in walking, not elsewhere classified  Rationale for Evaluation and Treatment: Rehabilitation  PERTINENT HISTORY: Patient is a 71 y.o. male who presents to outpatient physical therapy with a referral for medical diagnosis lumbar DDD (degenerative disc disease, lumbar stenosis with neurogenic claudication, lumbar radiculitis. This patient's chief complaints consist of chronic pain in the low back and upper glutes that moves up towards his B scapulae with prolonged standing/walking and with intermittent shooting down his R left to the knee, leading to the following functional deficits: difficulty with anything that requires a lot of standing/walking, cooking for church/wine club, cleaning the house, waiting in line, standing longer than 5-20 min, having conversations with others while standing, yard work, Orthoptist, cleaning, sit <> stand, bending and lifting together, staining deck, shopping, walking more than 2 blocks, social participation, changing positions in the bed, golfing.    Relevant past medical history and comorbidities include asthma, Zenker's (hypopharyngeal) diverticulum, afib, GERD, hx of fatty liver, hypothyroidism, BPH, OA, hx adeomatous polyp of colon, inguinal hernia, L knee pain, hx of dysplastic nevus, colon surgery (7 inches removed of colon), cataract extraction, L knee pain.  Patient denies hx of cancer, stroke, seizures, lung problem, diabetes, unexplained weight loss, unexplained  changes in bowel or bladder problems, unexplained stumbling or dropping things, spinal surgery. He has been exercising daily.     PRECAUTIONS: none  SUBJECTIVE: Patient reports he felt better this morning but his back pain has increased and he feels increasingly stiff and bent over. He has been doing HEP that PT Dorothea Ogle provided last PT session and the  bridges are hurting a bit as he gets used to them.   PAIN:  Are you having pain? NPRS 5/10 in low back. Reports earlier today pain was radiating towards thoracic spine and right thigh.    TODAY'S TREATMENT  Therapeutic exercise: to centralize symptoms and improve ROM, strength, muscular endurance, and activity tolerance required for successful completion of functional activities.  - Sidelying open book (thoracic rotation) to improve thoracic, shoulder girdle, and upper trunk mobility. Required instruction for technique and cuing to achieve end range as tolerated, hold time, and breathing technique.1x2 min each side with self selected pace after cuing to slow down with demonstration of 5 second holds.  - prone press up, 3x10 - hooklying low trunk rotation, 1x10 (low back pain with knees to right).  - hooklying low trunk rotation with wide feet, allowing contralateral knee to drop towards table, 1x10 each direction. (Low back pain with knees to the right).  - hooklying single knee to chest, 2 second hold, 3x each side.  - hooklying double knees to chest AROM with no UE assistance, 1x3.  - verbal review of HEP  Manual therapy: to reduce pain and tissue tension, improve range of motion, neuromodulation, in order to promote improved ability to complete functional activities. PRONE with pillow under chest - CPA grade II-IV from upper thoracic spine through L5, 10-30 seconds each segment. Pain at approx T6 and all lumbar segments (R >L).  - sidelying lumbar rotational gapping, grade I-III, 1x15 each side.   Pt required multimodal cuing for proper technique and to facilitate improved neuromuscular control, strength, range of motion, and functional ability resulting in improved performance and form.   PATIENT EDUCATION:  Education details: Exercise purpose/form. Self management techniques.  Person educated: Patient Education method: Explanation, demonstration, VC, TC Education comprehension:  verbalized understanding, demonstrated understanding, and needs further education     HOME EXERCISE PROGRAM: Access Code: [email or web address] URL: [email or web address] Date: 12/28/2021 Prepared by: Rosita Kea  Exercises - Sidelying Thoracic Rotation with Open Book  - 1-2 x daily - 3 sets - 10 reps - 5 seconds hold - Bridge  - 1-2 x daily - 3 sets - 10 reps - 5 seconds hold - Supine Lower Trunk Rotation  - 1 x daily - 3 sets - 15 reps - Prone Press Up  - 1-2 x daily - 3 sets - 10 reps - Bilateral Bent Leg Lift  - 1-2 x daily - 3 sets - 10 reps - Seated Thoracic Lumbar Extension  - Seated Flexion Stretch with Swiss Ball  - 1 x daily - 1 sets - 20 reps - 5 seconds hold   ASSESSMENT:   CLINICAL IMPRESSION: Patient arrived in very stooped standing position that improved following exercise and manual therapy intervention. He also reported improved pain by end of session. Patient appears to be participating in HEP well but does need additional cuing in the clinic to slow down his movements to get a better stretch for optimum benefit. Today's session focused on improved joint mobility and range of motion to improve posture. Patient continues to struggle with upright posture and standing tolerance. Patient would benefit  from continued management of limiting condition by skilled physical therapist to address remaining impairments and functional limitations to work towards stated goals and return to PLOF or maximal functional independence.    Patient is a 71 y.o. male referred to outpatient physical therapy with a medical diagnosis of lumbar DDD (degenerative disc disease, lumbar stenosis with neurogenic claudication, lumbar radiculitis who presents with signs and symptoms consistent with chronic low back and thoracic spine pain and stiffness with chronic kyphotic postural changes, hip extension weakness, and intermittent radicular symptoms contributing. Patient's pattern of symptoms suggests  sustained spinal extension intolerance and difficulty with postural endurance is likely contributing to condition. Previous episode of care was able to improve patient's quality of life some with decreased pain and improved function and patient has potential for improvement from his current state in these areas. Patient presents with significant pain, joint stiffness, posture, ROM, motor control, muscle performance (strength/power/endurance), and activity tolerance impairments that are limiting ability to complete his usual activities including anything that requires a lot of standing/walking, cooking for church/wine club, cleaning the house, waiting in line, standing longer than 5-20 min, having conversations with others while standing, yard work, Orthoptist, cleaning, sit <> stand, bending and lifting together, staining deck, shopping, walking more than 2 blocks, social participation, changing positions in the bed, golfing without difficulty. Patient will benefit from skilled physical therapy intervention to address current body structure impairments and activity limitations to improve function and work towards goals set in current POC in order to return to prior level of function or maximal functional improvement.      OBJECTIVE IMPAIRMENTS Abnormal gait, decreased activity tolerance, decreased endurance, decreased mobility, difficulty walking, decreased ROM, decreased strength, hypomobility, impaired perceived functional ability, increased muscle spasms, impaired flexibility, improper body mechanics, postural dysfunction, and pain.    ACTIVITY LIMITATIONS carrying, lifting, bending, standing, bed mobility, dressing, and locomotion level   PARTICIPATION LIMITATIONS: meal prep, cleaning, laundry, interpersonal relationship, shopping, community activity, yard work, church, and   asthma, Zenker's (hypopharyngeal) diverticulum, afib, GERD, hx of fatty liver, hypothyroidism, BPH, OA, hx adeomatous  polyp of colon, inguinal hernia, L knee pain, hx of dysplastic nevus, colon surgery (7 inches removed of colon), cataract extraction, L knee pain   PERSONAL FACTORS Age, Past/current experiences, Time since onset of injury/illness/exacerbation, and 3+ comorbidities:   asthma, Zenker's (hypopharyngeal) diverticulum, afib, GERD, hx of fatty liver, hypothyroidism, BPH, OA, hx adeomatous polyp of colon, inguinal hernia, L knee pain, hx of dysplastic nevus, colon surgery (7 inches removed of colon), cataract extraction, L knee pain are also affecting patient's functional outcome.    REHAB POTENTIAL: Good   CLINICAL DECISION MAKING: Stable/uncomplicated   EVALUATION COMPLEXITY: Low     GOALS: Goals reviewed with patient? No   SHORT TERM GOALS: Target date: 01/03/2022   Patient will be independent with initial home exercise program for self-management of symptoms. Baseline: Initial HEP to be provided at visit 2 as appropriate (12/20/21); initial HEP provided visit 2 (12/26/2021); Goal status: In-progress     LONG TERM GOALS: Target date: 03/14/2022   Patient will be independent with a long-term home exercise program for self-management of symptoms.  Baseline: Initial HEP to be provided at visit 2 as appropriate (12/20/21); initial HEP provided visit 2 (12/26/2021); Goal status: In-progress   2.  Patient will demonstrate improved FOTO to equal or greater than 67 by visit #11 to demonstrate improvement in overall condition and self-reported functional ability.  Baseline: 54 (12/20/21); Goal status: In-progress  3.  Patient will ambulate equal or greater than 1200 feet on 6 Minute Walk Test with no increase in symptoms to demonstrate improved community ambulation.  Baseline: to be tested visit 2 as appropriate (12/20/21); 1,225' up to 2/10 NPS (12/26/2021);  Goal status: In-progress   4.  Patient will demonstrate the ability to lift 25# box floor to waist 1x10 times in a row with no increase in  symptoms to improve his ability to bend and lift things around his home.  Baseline: reports difficulty with bending and lifting items at home  (12/20/21); Goal status: In-progress   5.  Patient will report the ability to stand/walk equal or greater than 60 min before needing to sit to improve his ability to stand in line, socialize, and cook.  Baseline: 5-20 min (12/20/21); Goal status: In-progress     PLAN: PT FREQUENCY: 1-2x/week   PT DURATION: 12 weeks   PLANNED INTERVENTIONS: Therapeutic exercises, Therapeutic activity, Neuromuscular re-education, Patient/Family education, Joint mobilization, DME instructions, Dry Needling, Electrical stimulation, Spinal mobilization, Cryotherapy, Moist heat, Manual therapy, and Re-evaluation.   PLAN FOR NEXT SESSION: 6MWT, update HEP as appropriate, manual therapy and exercises for improved joint mobility in the spine.   Nancy Nordmann, PT, DPT 12/28/21, 6:02 PM  [facility] [address] [place], [place] [address] P: [phone] I F: [phone]

## 2022-01-02 ENCOUNTER — Ambulatory Visit: Payer: Medicare Other | Admitting: Physical Therapy

## 2022-01-02 ENCOUNTER — Encounter: Payer: Self-pay | Admitting: Physical Therapy

## 2022-01-02 DIAGNOSIS — M5459 Other low back pain: Secondary | ICD-10-CM | POA: Diagnosis not present

## 2022-01-02 DIAGNOSIS — R293 Abnormal posture: Secondary | ICD-10-CM

## 2022-01-02 DIAGNOSIS — M546 Pain in thoracic spine: Secondary | ICD-10-CM

## 2022-01-02 DIAGNOSIS — R262 Difficulty in walking, not elsewhere classified: Secondary | ICD-10-CM

## 2022-01-02 NOTE — Therapy (Signed)
OUTPATIENT PHYSICAL THERAPY TREATMENT NOTE   Patient Name: Daniel [name] MRN: [id number] DOB:1951-01-10, 71 y.o., male Today's Date: 01/02/2022  PCP: Jerrol Banana., MD REFERRING PROVIDER: Allene Dillon, NP  END OF SESSION:   PT End of Session - 01/02/22 1618     Visit Number 4    Number of Visits 24    Date for PT Re-Evaluation 03/14/22    Authorization Type [employer] MEDICARE reporting period from 12/20/2021    Progress Note Due on Visit 10    PT Start Time 1613    PT Stop Time 1650    PT Time Calculation (min) 37 min    Activity Tolerance Patient tolerated treatment well;No increased pain    Behavior During Therapy [facility] for tasks assessed/performed              Past Medical History:  Diagnosis Date   Actinic keratosis    Asthma    Eczema    Hx of dysplastic nevus 06/06/2006   Mid lower back, slight atypia   Hypothyroidism    Seasonal allergies    Thyroid disease    Past Surgical History:  Procedure Laterality Date   CATARACT EXTRACTION     COLON SURGERY     COLONOSCOPY WITH PROPOFOL N/A 11/18/2014   Procedure: COLONOSCOPY WITH PROPOFOL;  Surgeon: [name], MD;  Location: [place];  Service: Endoscopy;  Laterality: N/A;   COLONOSCOPY WITH PROPOFOL N/A 12/13/2021   Procedure: COLONOSCOPY WITH PROPOFOL;  Surgeon: [name], MD;  Location: [facility];  Service: Endoscopy;  Laterality: N/A;   EYE SURGERY     KNEE SURGERY     NASAL SEPTUM SURGERY     Patient Active Problem List   Diagnosis Date Noted   Zenker's (hypopharyngeal) diverticulum 03/22/2018   Gastroesophageal reflux disease 03/22/2018   Fatty liver 10/09/2017   BPH (benign prostatic hyperplasia) 11/21/2014   Hypothyroidism 11/21/2014   Obesity 11/21/2014   Asthma 11/21/2014   Inguinal hernia 11/21/2014   OA (osteoarthritis) 11/21/2014   H/O adenomatous polyp of colon 10/14/2014    REFERRING DIAG: lumbar DDD (degenerative disc disease), lumbar stenosis with  neurogenic claudication, lumbar radiculitis  THERAPY DIAG:  Other low back pain  Pain in thoracic spine  Abnormal posture  Difficulty in walking, not elsewhere classified  Rationale for Evaluation and Treatment: Rehabilitation  PERTINENT HISTORY: Patient is a 71 y.o. male who presents to outpatient physical therapy with a referral for medical diagnosis lumbar DDD (degenerative disc disease, lumbar stenosis with neurogenic claudication, lumbar radiculitis. This patient's chief complaints consist of chronic pain in the low back and upper glutes that moves up towards his B scapulae with prolonged standing/walking and with intermittent shooting down his R left to the knee, leading to the following functional deficits: difficulty with anything that requires a lot of standing/walking, cooking for church/wine club, cleaning the house, waiting in line, standing longer than 5-20 min, having conversations with others while standing, yard work, Orthoptist, cleaning, sit <> stand, bending and lifting together, staining deck, shopping, walking more than 2 blocks, social participation, changing positions in the bed, golfing.    Relevant past medical history and comorbidities include asthma, Zenker's (hypopharyngeal) diverticulum, afib, GERD, hx of fatty liver, hypothyroidism, BPH, OA, hx adeomatous polyp of colon, inguinal hernia, L knee pain, hx of dysplastic nevus, colon surgery (7 inches removed of colon), cataract extraction, L knee pain.  Patient denies hx of cancer, stroke, seizures, lung problem, diabetes, unexplained weight loss,  unexplained changes in bowel or bladder problems, unexplained stumbling or dropping things, spinal surgery. He has been exercising daily.     PRECAUTIONS: none  SUBJECTIVE: Patient reports he is not feeling very well today because his back is feeling worse. He had a lot of pain getting up and and could barely reach to get his shoes on. He trimmed his hedges on  Saturday and wore the back brace for that. He wonders if he overdid it on Saturday. He was not as sore yesterday. He states he felt better after PT last week. HEP going pretty good. He feels like they are helping him.   PAIN:  Are you having pain? NPRS 4-5/10 in low back currently.   TODAY'S TREATMENT  Therapeutic exercise: to centralize symptoms and improve ROM, strength, muscular endurance, and activity tolerance required for successful completion of functional activities.  - Sidelying open book (thoracic rotation) to improve thoracic, shoulder girdle, and upper trunk mobility. Required instruction for technique and cuing to achieve end range as tolerated, hold time, and breathing technique.1x2 min each side with self selected pace after cuing to slow down.  - prone press up, 3x10 - standing wall sags, 1x20  Manual therapy: to reduce pain and tissue tension, improve range of motion, neuromodulation, in order to promote improved ability to complete functional activities. PRONE with pillow under chest - CPA grade II-IV from upper thoracic spine through L5 with KE wedge, 20-40 reps each segment.  - supine PROM low trunk rotation with legs on green theraball to stretch hips and low back. 2x10 each side.  - hooklying  PROM hip flexor stretch, 3x30 seconds each side in Gaenslen's position.   Pt required multimodal cuing for proper technique and to facilitate improved neuromuscular control, strength, range of motion, and functional ability resulting in improved performance and form.   PATIENT EDUCATION:  Education details: Exercise purpose/form. Self management techniques.  Person educated: Patient Education method: Explanation, demonstration, VC, TC Education comprehension: verbalized understanding, demonstrated understanding, and needs further education     HOME EXERCISE PROGRAM: Access Code: [email or web address] URL: [email or web address] Date: 12/28/2021 Prepared by: Rosita Kea  Exercises - Sidelying Thoracic Rotation with Open Book  - 1-2 x daily - 3 sets - 10 reps - 5 seconds hold - Bridge  - 1-2 x daily - 3 sets - 10 reps - 5 seconds hold - Supine Lower Trunk Rotation  - 1 x daily - 3 sets - 15 reps - Prone Press Up  - 1-2 x daily - 3 sets - 10 reps - Bilateral Bent Leg Lift  - 1-2 x daily - 3 sets - 10 reps - Seated Thoracic Lumbar Extension  - Seated Flexion Stretch with Swiss Ball  - 1 x daily - 1 sets - 20 reps - 5 seconds hold   ASSESSMENT:   CLINICAL IMPRESSION: Patient arrived in stooped position reporting increased low back pain and stiffness. This improved with exercises and manual interventions but continued to have some pain in the low back by end of session. Patient continues to have difficulty with activities that require prolonged standing and struggles to maintain upright posture. Patient would benefit from continued management of limiting condition by skilled physical therapist to address remaining impairments and functional limitations to work towards stated goals and return to PLOF or maximal functional independence.    Patient is a 71 y.o. male referred to outpatient physical therapy with a medical diagnosis of lumbar DDD (degenerative disc disease, lumbar stenosis with neurogenic claudication,  lumbar radiculitis who presents with signs and symptoms consistent with chronic low back and thoracic spine pain and stiffness with chronic kyphotic postural changes, hip extension weakness, and intermittent radicular symptoms contributing. Patient's pattern of symptoms suggests sustained spinal extension intolerance and difficulty with postural endurance is likely contributing to condition. Previous episode of care was able to improve patient's quality of life some with decreased pain and improved function and patient has potential for improvement from his current state in these areas. Patient presents with significant pain, joint stiffness, posture,  ROM, motor control, muscle performance (strength/power/endurance), and activity tolerance impairments that are limiting ability to complete his usual activities including anything that requires a lot of standing/walking, cooking for church/wine club, cleaning the house, waiting in line, standing longer than 5-20 min, having conversations with others while standing, yard work, Orthoptist, cleaning, sit <> stand, bending and lifting together, staining deck, shopping, walking more than 2 blocks, social participation, changing positions in the bed, golfing without difficulty. Patient will benefit from skilled physical therapy intervention to address current body structure impairments and activity limitations to improve function and work towards goals set in current POC in order to return to prior level of function or maximal functional improvement.      OBJECTIVE IMPAIRMENTS Abnormal gait, decreased activity tolerance, decreased endurance, decreased mobility, difficulty walking, decreased ROM, decreased strength, hypomobility, impaired perceived functional ability, increased muscle spasms, impaired flexibility, improper body mechanics, postural dysfunction, and pain.    ACTIVITY LIMITATIONS carrying, lifting, bending, standing, bed mobility, dressing, and locomotion level   PARTICIPATION LIMITATIONS: meal prep, cleaning, laundry, interpersonal relationship, shopping, community activity, yard work, church, and   asthma, Zenker's (hypopharyngeal) diverticulum, afib, GERD, hx of fatty liver, hypothyroidism, BPH, OA, hx adeomatous polyp of colon, inguinal hernia, L knee pain, hx of dysplastic nevus, colon surgery (7 inches removed of colon), cataract extraction, L knee pain   PERSONAL FACTORS Age, Past/current experiences, Time since onset of injury/illness/exacerbation, and 3+ comorbidities:   asthma, Zenker's (hypopharyngeal) diverticulum, afib, GERD, hx of fatty liver, hypothyroidism, BPH, OA, hx  adeomatous polyp of colon, inguinal hernia, L knee pain, hx of dysplastic nevus, colon surgery (7 inches removed of colon), cataract extraction, L knee pain are also affecting patient's functional outcome.    REHAB POTENTIAL: Good   CLINICAL DECISION MAKING: Stable/uncomplicated   EVALUATION COMPLEXITY: Low     GOALS: Goals reviewed with patient? No   SHORT TERM GOALS: Target date: 01/03/2022   Patient will be independent with initial home exercise program for self-management of symptoms. Baseline: Initial HEP to be provided at visit 2 as appropriate (12/20/21); initial HEP provided visit 2 (12/26/2021); Goal status: In-progress     LONG TERM GOALS: Target date: 03/14/2022   Patient will be independent with a long-term home exercise program for self-management of symptoms.  Baseline: Initial HEP to be provided at visit 2 as appropriate (12/20/21); initial HEP provided visit 2 (12/26/2021); Goal status: In-progress   2.  Patient will demonstrate improved FOTO to equal or greater than 67 by visit #11 to demonstrate improvement in overall condition and self-reported functional ability.  Baseline: 54 (12/20/21); Goal status: In-progress   3.  Patient will ambulate equal or greater than 1200 feet on 6 Minute Walk Test with no increase in symptoms to demonstrate improved community ambulation.  Baseline: to be tested visit 2 as appropriate (12/20/21); 1,225' up to 2/10 NPS (12/26/2021);  Goal status: In-progress   4.  Patient will demonstrate the ability to lift  25# box floor to waist 1x10 times in a row with no increase in symptoms to improve his ability to bend and lift things around his home.  Baseline: reports difficulty with bending and lifting items at home  (12/20/21); Goal status: In-progress   5.  Patient will report the ability to stand/walk equal or greater than 60 min before needing to sit to improve his ability to stand in line, socialize, and cook.  Baseline: 5-20 min  (12/20/21); Goal status: In-progress     PLAN: PT FREQUENCY: 1-2x/week   PT DURATION: 12 weeks   PLANNED INTERVENTIONS: Therapeutic exercises, Therapeutic activity, Neuromuscular re-education, Patient/Family education, Joint mobilization, DME instructions, Dry Needling, Electrical stimulation, Spinal mobilization, Cryotherapy, Moist heat, Manual therapy, and Re-evaluation.   PLAN FOR NEXT SESSION: 6MWT, update HEP as appropriate, manual therapy and exercises for improved joint mobility in the spine.   Nancy Nordmann, PT, DPT 01/02/22, 4:51 PM  [facility] [address] [place],  [address] P: [phone] I F: [phone]

## 2022-01-04 ENCOUNTER — Ambulatory Visit: Payer: Medicare Other | Admitting: Physical Therapy

## 2022-01-04 ENCOUNTER — Encounter: Payer: Self-pay | Admitting: Physical Therapy

## 2022-01-04 DIAGNOSIS — R293 Abnormal posture: Secondary | ICD-10-CM | POA: Diagnosis not present

## 2022-01-04 DIAGNOSIS — M546 Pain in thoracic spine: Secondary | ICD-10-CM

## 2022-01-04 DIAGNOSIS — M5459 Other low back pain: Secondary | ICD-10-CM

## 2022-01-04 DIAGNOSIS — R262 Difficulty in walking, not elsewhere classified: Secondary | ICD-10-CM | POA: Diagnosis not present

## 2022-01-04 NOTE — Therapy (Signed)
OUTPATIENT PHYSICAL THERAPY TREATMENT NOTE   Patient Name: Daniel [name] MRN: [id number] DOB:1950/06/11, 71 y.o., male Today's Date: 01/04/2022  PCP: Jerrol Banana., MD REFERRING PROVIDER: Allene Dillon, NP  END OF SESSION:   PT End of Session - 01/04/22 1608     Visit Number 5    Number of Visits 24    Date for PT Re-Evaluation 03/14/22    Authorization Type [employer] MEDICARE reporting period from 12/20/2021    Progress Note Due on Visit 10    PT Start Time 1605    PT Stop Time 1643    PT Time Calculation (min) 38 min    Activity Tolerance Patient tolerated treatment well;No increased pain    Behavior During Therapy [facility] for tasks assessed/performed               Past Medical History:  Diagnosis Date   Actinic keratosis    Asthma    Eczema    Hx of dysplastic nevus 06/06/2006   Mid lower back, slight atypia   Hypothyroidism    Seasonal allergies    Thyroid disease    Past Surgical History:  Procedure Laterality Date   CATARACT EXTRACTION     COLON SURGERY     COLONOSCOPY WITH PROPOFOL N/A 11/18/2014   Procedure: COLONOSCOPY WITH PROPOFOL;  Surgeon: [name], MD;  Location: [place];  Service: Endoscopy;  Laterality: N/A;   COLONOSCOPY WITH PROPOFOL N/A 12/13/2021   Procedure: COLONOSCOPY WITH PROPOFOL;  Surgeon: [name], MD;  Location: [facility];  Service: Endoscopy;  Laterality: N/A;   EYE SURGERY     KNEE SURGERY     NASAL SEPTUM SURGERY     Patient Active Problem List   Diagnosis Date Noted   Zenker's (hypopharyngeal) diverticulum 03/22/2018   Gastroesophageal reflux disease 03/22/2018   Fatty liver 10/09/2017   BPH (benign prostatic hyperplasia) 11/21/2014   Hypothyroidism 11/21/2014   Obesity 11/21/2014   Asthma 11/21/2014   Inguinal hernia 11/21/2014   OA (osteoarthritis) 11/21/2014   H/O adenomatous polyp of colon 10/14/2014    REFERRING DIAG: lumbar DDD (degenerative disc disease), lumbar stenosis with  neurogenic claudication, lumbar radiculitis  THERAPY DIAG:  Other low back pain  Pain in thoracic spine  Abnormal posture  Difficulty in walking, not elsewhere classified  Rationale for Evaluation and Treatment: Rehabilitation  PERTINENT HISTORY: Patient is a 71 y.o. male who presents to outpatient physical therapy with a referral for medical diagnosis lumbar DDD (degenerative disc disease, lumbar stenosis with neurogenic claudication, lumbar radiculitis. This patient's chief complaints consist of chronic pain in the low back and upper glutes that moves up towards his B scapulae with prolonged standing/walking and with intermittent shooting down his R left to the knee, leading to the following functional deficits: difficulty with anything that requires a lot of standing/walking, cooking for church/wine club, cleaning the house, waiting in line, standing longer than 5-20 min, having conversations with others while standing, yard work, Orthoptist, cleaning, sit <> stand, bending and lifting together, staining deck, shopping, walking more than 2 blocks, social participation, changing positions in the bed, golfing.    Relevant past medical history and comorbidities include asthma, Zenker's (hypopharyngeal) diverticulum, afib, GERD, hx of fatty liver, hypothyroidism, BPH, OA, hx adeomatous polyp of colon, inguinal hernia, L knee pain, hx of dysplastic nevus, colon surgery (7 inches removed of colon), cataract extraction, L knee pain.  Patient denies hx of cancer, stroke, seizures, lung problem, diabetes, unexplained weight  loss, unexplained changes in bowel or bladder problems, unexplained stumbling or dropping things, spinal surgery. He has been exercising daily.     PRECAUTIONS: none  SUBJECTIVE: Patient reports he is "fair" upon arrival. States he was a little sore after last PT session but ultimately felt better. He felt okay when he got up this morning. After he went on his morning  walk he started cleaning the house and his pain started climbing in his low back and spreading to his upper back. He rates his current pain as 3/10 in the low back.   PAIN:  Are you having pain? NPRS 3/10 in low back   TODAY'S TREATMENT  Therapeutic exercise: to centralize symptoms and improve ROM, strength, muscular endurance, and activity tolerance required for successful completion of functional activities.  - hooklying LTR, 1x3 min at self selected pace.  - prone press up, 3x10 - prone scapular retraction with hands at side and palms down, face in blue face cradle, 3x10. VC/TC for improved motion - seated rows at [facility] machine focusing on upper back extension and scapular retractoin, 3x10, 15/20/20#  Manual therapy: to reduce pain and tissue tension, improve range of motion, neuromodulation, in order to promote improved ability to complete functional activities. PRONE with face in blue face cradle - CPA grade II-IV thoracic spine through L5 with KE wedge, 40 reps each segment.then alternating UPA 1x10-15 reps per segment.  - CPA grade III-IV at lower cervical segments, 1x5-10 each side.   Pt required multimodal cuing for proper technique and to facilitate improved neuromuscular control, strength, range of motion, and functional ability resulting in improved performance and form.   PATIENT EDUCATION:  Education details: Exercise purpose/form. Self management techniques.  Person educated: Patient Education method: Explanation, demonstration, VC, TC Education comprehension: verbalized understanding, demonstrated understanding, and needs further education     HOME EXERCISE PROGRAM: Access Code: [email or web address] URL: [email or web address] Date: 12/28/2021 Prepared by: Rosita Kea  Exercises - Sidelying Thoracic Rotation with Open Book  - 1-2 x daily - 3 sets - 10 reps - 5 seconds hold - Bridge  - 1-2 x daily - 3 sets - 10 reps - 5 seconds hold - Supine Lower Trunk Rotation  -  1 x daily - 3 sets - 15 reps - Prone Press Up  - 1-2 x daily - 3 sets - 10 reps - Bilateral Bent Leg Lift  - 1-2 x daily - 3 sets - 10 reps - Seated Thoracic Lumbar Extension  - Seated Flexion Stretch with Swiss Ball  - 1 x daily - 1 sets - 20 reps - 5 seconds hold   ASSESSMENT:   CLINICAL IMPRESSION: Patient arrived in stooped position with low back pain. Session concentrated on improving segmental motion towards extension followed by stretching and strengthening into that range of motion and to improve posture. Patient more upright at end of session but continues to struggle with upper crossed posture.Patient would benefit from continued management of limiting condition by skilled physical therapist to address remaining impairments and functional limitations to work towards stated goals and return to PLOF or maximal functional independence.   Patient is a 71 y.o. male referred to outpatient physical therapy with a medical diagnosis of lumbar DDD (degenerative disc disease, lumbar stenosis with neurogenic claudication, lumbar radiculitis who presents with signs and symptoms consistent with chronic low back and thoracic spine pain and stiffness with chronic kyphotic postural changes, hip extension weakness, and intermittent radicular symptoms contributing. Patient's pattern of symptoms suggests sustained spinal  extension intolerance and difficulty with postural endurance is likely contributing to condition. Previous episode of care was able to improve patient's quality of life some with decreased pain and improved function and patient has potential for improvement from his current state in these areas. Patient presents with significant pain, joint stiffness, posture, ROM, motor control, muscle performance (strength/power/endurance), and activity tolerance impairments that are limiting ability to complete his usual activities including anything that requires a lot of standing/walking, cooking for  church/wine club, cleaning the house, waiting in line, standing longer than 5-20 min, having conversations with others while standing, yard work, Orthoptist, cleaning, sit <> stand, bending and lifting together, staining deck, shopping, walking more than 2 blocks, social participation, changing positions in the bed, golfing without difficulty. Patient will benefit from skilled physical therapy intervention to address current body structure impairments and activity limitations to improve function and work towards goals set in current POC in order to return to prior level of function or maximal functional improvement.      OBJECTIVE IMPAIRMENTS Abnormal gait, decreased activity tolerance, decreased endurance, decreased mobility, difficulty walking, decreased ROM, decreased strength, hypomobility, impaired perceived functional ability, increased muscle spasms, impaired flexibility, improper body mechanics, postural dysfunction, and pain.    ACTIVITY LIMITATIONS carrying, lifting, bending, standing, bed mobility, dressing, and locomotion level   PARTICIPATION LIMITATIONS: meal prep, cleaning, laundry, interpersonal relationship, shopping, community activity, yard work, church, and   asthma, Zenker's (hypopharyngeal) diverticulum, afib, GERD, hx of fatty liver, hypothyroidism, BPH, OA, hx adeomatous polyp of colon, inguinal hernia, L knee pain, hx of dysplastic nevus, colon surgery (7 inches removed of colon), cataract extraction, L knee pain   PERSONAL FACTORS Age, Past/current experiences, Time since onset of injury/illness/exacerbation, and 3+ comorbidities:   asthma, Zenker's (hypopharyngeal) diverticulum, afib, GERD, hx of fatty liver, hypothyroidism, BPH, OA, hx adeomatous polyp of colon, inguinal hernia, L knee pain, hx of dysplastic nevus, colon surgery (7 inches removed of colon), cataract extraction, L knee pain are also affecting patient's functional outcome.    REHAB POTENTIAL: Good    CLINICAL DECISION MAKING: Stable/uncomplicated   EVALUATION COMPLEXITY: Low     GOALS: Goals reviewed with patient? No   SHORT TERM GOALS: Target date: 01/03/2022   Patient will be independent with initial home exercise program for self-management of symptoms. Baseline: Initial HEP to be provided at visit 2 as appropriate (12/20/21); initial HEP provided visit 2 (12/26/2021); Goal status: In-progress     LONG TERM GOALS: Target date: 03/14/2022   Patient will be independent with a long-term home exercise program for self-management of symptoms.  Baseline: Initial HEP to be provided at visit 2 as appropriate (12/20/21); initial HEP provided visit 2 (12/26/2021); Goal status: In-progress   2.  Patient will demonstrate improved FOTO to equal or greater than 67 by visit #11 to demonstrate improvement in overall condition and self-reported functional ability.  Baseline: 54 (12/20/21); Goal status: In-progress   3.  Patient will ambulate equal or greater than 1200 feet on 6 Minute Walk Test with no increase in symptoms to demonstrate improved community ambulation.  Baseline: to be tested visit 2 as appropriate (12/20/21); 1,225' up to 2/10 NPS (12/26/2021);  Goal status: In-progress   4.  Patient will demonstrate the ability to lift 25# box floor to waist 1x10 times in a row with no increase in symptoms to improve his ability to bend and lift things around his home.  Baseline: reports difficulty with bending and lifting items at home  (  12/20/21); Goal status: In-progress   5.  Patient will report the ability to stand/walk equal or greater than 60 min before needing to sit to improve his ability to stand in line, socialize, and cook.  Baseline: 5-20 min (12/20/21); Goal status: In-progress     PLAN: PT FREQUENCY: 1-2x/week   PT DURATION: 12 weeks   PLANNED INTERVENTIONS: Therapeutic exercises, Therapeutic activity, Neuromuscular re-education, Patient/Family education, Joint  mobilization, DME instructions, Dry Needling, Electrical stimulation, Spinal mobilization, Cryotherapy, Moist heat, Manual therapy, and Re-evaluation.   PLAN FOR NEXT SESSION: 6MWT, update HEP as appropriate, manual therapy and exercises for improved joint mobility in the spine.   Nancy Nordmann, PT, DPT 01/04/22, 4:45 PM  [facility] [address] [place], [place] [address] P: [phone] I F: [phone]

## 2022-01-06 DIAGNOSIS — M48062 Spinal stenosis, lumbar region with neurogenic claudication: Secondary | ICD-10-CM | POA: Diagnosis not present

## 2022-01-06 DIAGNOSIS — M5416 Radiculopathy, lumbar region: Secondary | ICD-10-CM | POA: Diagnosis not present

## 2022-01-06 DIAGNOSIS — M5136 Other intervertebral disc degeneration, lumbar region: Secondary | ICD-10-CM | POA: Diagnosis not present

## 2022-01-09 ENCOUNTER — Encounter: Payer: Self-pay | Admitting: Physical Therapy

## 2022-01-09 ENCOUNTER — Ambulatory Visit: Payer: Medicare Other | Admitting: Physical Therapy

## 2022-01-09 DIAGNOSIS — Z125 Encounter for screening for malignant neoplasm of prostate: Secondary | ICD-10-CM | POA: Diagnosis not present

## 2022-01-09 DIAGNOSIS — R262 Difficulty in walking, not elsewhere classified: Secondary | ICD-10-CM

## 2022-01-09 DIAGNOSIS — M546 Pain in thoracic spine: Secondary | ICD-10-CM | POA: Diagnosis not present

## 2022-01-09 DIAGNOSIS — R293 Abnormal posture: Secondary | ICD-10-CM | POA: Diagnosis not present

## 2022-01-09 DIAGNOSIS — M5459 Other low back pain: Secondary | ICD-10-CM

## 2022-01-09 DIAGNOSIS — N401 Enlarged prostate with lower urinary tract symptoms: Secondary | ICD-10-CM | POA: Diagnosis not present

## 2022-01-09 NOTE — Therapy (Signed)
OUTPATIENT PHYSICAL THERAPY TREATMENT NOTE   Patient Name: Daniel [name] MRN: [id number] DOB:16-Jan-1951, 71 y.o., male Today's Date: 01/09/2022  PCP: Jerrol Banana., MD REFERRING PROVIDER: Allene Dillon, NP  END OF SESSION:   PT End of Session - 01/09/22 1641     Visit Number 6    Number of Visits 24    Date for PT Re-Evaluation 03/14/22    Authorization Type [employer] MEDICARE reporting period from 12/20/2021    Progress Note Due on Visit 10    PT Start Time 1612    PT Stop Time 1650    PT Time Calculation (min) 38 min    Activity Tolerance Patient tolerated treatment well;No increased pain    Behavior During Therapy [facility] for tasks assessed/performed                Past Medical History:  Diagnosis Date   Actinic keratosis    Asthma    Eczema    Hx of dysplastic nevus 06/06/2006   Mid lower back, slight atypia   Hypothyroidism    Seasonal allergies    Thyroid disease    Past Surgical History:  Procedure Laterality Date   CATARACT EXTRACTION     COLON SURGERY     COLONOSCOPY WITH PROPOFOL N/A 11/18/2014   Procedure: COLONOSCOPY WITH PROPOFOL;  Surgeon: [name], MD;  Location: [place];  Service: Endoscopy;  Laterality: N/A;   COLONOSCOPY WITH PROPOFOL N/A 12/13/2021   Procedure: COLONOSCOPY WITH PROPOFOL;  Surgeon: [name], MD;  Location: [facility];  Service: Endoscopy;  Laterality: N/A;   EYE SURGERY     KNEE SURGERY     NASAL SEPTUM SURGERY     Patient Active Problem List   Diagnosis Date Noted   Zenker's (hypopharyngeal) diverticulum 03/22/2018   Gastroesophageal reflux disease 03/22/2018   Fatty liver 10/09/2017   BPH (benign prostatic hyperplasia) 11/21/2014   Hypothyroidism 11/21/2014   Obesity 11/21/2014   Asthma 11/21/2014   Inguinal hernia 11/21/2014   OA (osteoarthritis) 11/21/2014   H/O adenomatous polyp of colon 10/14/2014    REFERRING DIAG: lumbar DDD (degenerative disc disease), lumbar stenosis with  neurogenic claudication, lumbar radiculitis  THERAPY DIAG:  Other low back pain  Pain in thoracic spine  Abnormal posture  Difficulty in walking, not elsewhere classified  Rationale for Evaluation and Treatment: Rehabilitation  PERTINENT HISTORY: Patient is a 71 y.o. male who presents to outpatient physical therapy with a referral for medical diagnosis lumbar DDD (degenerative disc disease, lumbar stenosis with neurogenic claudication, lumbar radiculitis. This patient's chief complaints consist of chronic pain in the low back and upper glutes that moves up towards his B scapulae with prolonged standing/walking and with intermittent shooting down his R left to the knee, leading to the following functional deficits: difficulty with anything that requires a lot of standing/walking, cooking for church/wine club, cleaning the house, waiting in line, standing longer than 5-20 min, having conversations with others while standing, yard work, Orthoptist, cleaning, sit <> stand, bending and lifting together, staining deck, shopping, walking more than 2 blocks, social participation, changing positions in the bed, golfing.    Relevant past medical history and comorbidities include asthma, Zenker's (hypopharyngeal) diverticulum, afib, GERD, hx of fatty liver, hypothyroidism, BPH, OA, hx adeomatous polyp of colon, inguinal hernia, L knee pain, hx of dysplastic nevus, colon surgery (7 inches removed of colon), cataract extraction, L knee pain.  Patient denies hx of cancer, stroke, seizures, lung problem, diabetes, unexplained  weight loss, unexplained changes in bowel or bladder problems, unexplained stumbling or dropping things, spinal surgery. He has been exercising daily.     PRECAUTIONS: none  SUBJECTIVE: Patient reports he is doing a little better. He saw his referring clinician and she prescribed Tramadol and increased his Gabapentin, which seems to be helping his pain and posture. She would  also like him to continue PT. He is increasing his gabapentin tonight again to get to the dose she recommended. He reports no pain currently after sitting in the waiting room.   PAIN:  Are you having pain? No  TODAY'S TREATMENT  Therapeutic exercise: to centralize symptoms and improve ROM, strength, muscular endurance, and activity tolerance required for successful completion of functional activities.  - standing wall sag, 2x20 - prone press up, 3x10 - seated rows at [address] focusing on upper back extension and scapular retractoin, 3x10, 20# - seated thoracic extension with towel roll behind thoracic spine and hands behind head. 3x10  Manual therapy: to reduce pain and tissue tension, improve range of motion, neuromodulation, in order to promote improved ability to complete functional activities. PRONE with face in table face cradle - CPA grade II-IV thoracic spine through L5 with KE wedge, 40-60 reps each segment.then alternating UPA 1x10reps per segment.  - CPA grade III-IV at lower cervical segments, 1x10 each side.   Pt required multimodal cuing for proper technique and to facilitate improved neuromuscular control, strength, range of motion, and functional ability resulting in improved performance and form.   PATIENT EDUCATION:  Education details: Exercise purpose/form. Self management techniques.  Person educated: Patient Education method: Explanation, demonstration, VC, TC Education comprehension: verbalized understanding, demonstrated understanding, and needs further education     HOME EXERCISE PROGRAM: Access Code: [email or web address] URL: [email or web address] Date: 12/28/2021 Prepared by: Rosita Kea  Exercises - Sidelying Thoracic Rotation with Open Book  - 1-2 x daily - 3 sets - 10 reps - 5 seconds hold - Bridge  - 1-2 x daily - 3 sets - 10 reps - 5 seconds hold - Supine Lower Trunk Rotation  - 1 x daily - 3 sets - 15 reps - Prone Press Up  - 1-2 x daily - 3  sets - 10 reps - Bilateral Bent Leg Lift  - 1-2 x daily - 3 sets - 10 reps - Seated Thoracic Lumbar Extension  - Seated Flexion Stretch with Swiss Ball  - 1 x daily - 1 sets - 20 reps - 5 seconds hold   ASSESSMENT:   CLINICAL IMPRESSION: Patient arrives with improved posture. Continued with manual therapy and exercises for improved spinal mobility and posture. Patient tolerated well but continues to struggle with keeping improved posture. Plan to continue with similar interventions next session as tolerated. Patient would benefit from continued management of limiting condition by skilled physical therapist to address remaining impairments and functional limitations to work towards stated goals and return to PLOF or maximal functional independence.   Patient is a 71 y.o. male referred to outpatient physical therapy with a medical diagnosis of lumbar DDD (degenerative disc disease, lumbar stenosis with neurogenic claudication, lumbar radiculitis who presents with signs and symptoms consistent with chronic low back and thoracic spine pain and stiffness with chronic kyphotic postural changes, hip extension weakness, and intermittent radicular symptoms contributing. Patient's pattern of symptoms suggests sustained spinal extension intolerance and difficulty with postural endurance is likely contributing to condition. Previous episode of care was able to improve patient's quality of life some with decreased pain  and improved function and patient has potential for improvement from his current state in these areas. Patient presents with significant pain, joint stiffness, posture, ROM, motor control, muscle performance (strength/power/endurance), and activity tolerance impairments that are limiting ability to complete his usual activities including anything that requires a lot of standing/walking, cooking for church/wine club, cleaning the house, waiting in line, standing longer than 5-20 min, having conversations  with others while standing, yard work, Orthoptist, cleaning, sit <> stand, bending and lifting together, staining deck, shopping, walking more than 2 blocks, social participation, changing positions in the bed, golfing without difficulty. Patient will benefit from skilled physical therapy intervention to address current body structure impairments and activity limitations to improve function and work towards goals set in current POC in order to return to prior level of function or maximal functional improvement.      OBJECTIVE IMPAIRMENTS Abnormal gait, decreased activity tolerance, decreased endurance, decreased mobility, difficulty walking, decreased ROM, decreased strength, hypomobility, impaired perceived functional ability, increased muscle spasms, impaired flexibility, improper body mechanics, postural dysfunction, and pain.    ACTIVITY LIMITATIONS carrying, lifting, bending, standing, bed mobility, dressing, and locomotion level   PARTICIPATION LIMITATIONS: meal prep, cleaning, laundry, interpersonal relationship, shopping, community activity, yard work, church, and   asthma, Zenker's (hypopharyngeal) diverticulum, afib, GERD, hx of fatty liver, hypothyroidism, BPH, OA, hx adeomatous polyp of colon, inguinal hernia, L knee pain, hx of dysplastic nevus, colon surgery (7 inches removed of colon), cataract extraction, L knee pain   PERSONAL FACTORS Age, Past/current experiences, Time since onset of injury/illness/exacerbation, and 3+ comorbidities:   asthma, Zenker's (hypopharyngeal) diverticulum, afib, GERD, hx of fatty liver, hypothyroidism, BPH, OA, hx adeomatous polyp of colon, inguinal hernia, L knee pain, hx of dysplastic nevus, colon surgery (7 inches removed of colon), cataract extraction, L knee pain are also affecting patient's functional outcome.    REHAB POTENTIAL: Good   CLINICAL DECISION MAKING: Stable/uncomplicated   EVALUATION COMPLEXITY: Low     GOALS: Goals  reviewed with patient? No   SHORT TERM GOALS: Target date: 01/03/2022   Patient will be independent with initial home exercise program for self-management of symptoms. Baseline: Initial HEP to be provided at visit 2 as appropriate (12/20/21); initial HEP provided visit 2 (12/26/2021); Goal status: In-progress     LONG TERM GOALS: Target date: 03/14/2022   Patient will be independent with a long-term home exercise program for self-management of symptoms.  Baseline: Initial HEP to be provided at visit 2 as appropriate (12/20/21); initial HEP provided visit 2 (12/26/2021); Goal status: In-progress   2.  Patient will demonstrate improved FOTO to equal or greater than 67 by visit #11 to demonstrate improvement in overall condition and self-reported functional ability.  Baseline: 54 (12/20/21); Goal status: In-progress   3.  Patient will ambulate equal or greater than 1200 feet on 6 Minute Walk Test with no increase in symptoms to demonstrate improved community ambulation.  Baseline: to be tested visit 2 as appropriate (12/20/21); 1,225' up to 2/10 NPS (12/26/2021);  Goal status: In-progress   4.  Patient will demonstrate the ability to lift 25# box floor to waist 1x10 times in a row with no increase in symptoms to improve his ability to bend and lift things around his home.  Baseline: reports difficulty with bending and lifting items at home  (12/20/21); Goal status: In-progress   5.  Patient will report the ability to stand/walk equal or greater than 60 min before needing to sit to improve his  ability to stand in line, socialize, and cook.  Baseline: 5-20 min (12/20/21); Goal status: In-progress     PLAN: PT FREQUENCY: 1-2x/week   PT DURATION: 12 weeks   PLANNED INTERVENTIONS: Therapeutic exercises, Therapeutic activity, Neuromuscular re-education, Patient/Family education, Joint mobilization, DME instructions, Dry Needling, Electrical stimulation, Spinal mobilization, Cryotherapy, Moist  heat, Manual therapy, and Re-evaluation.   PLAN FOR NEXT SESSION: 6MWT, update HEP as appropriate, manual therapy and exercises for improved joint mobility in the spine.   Nancy Nordmann, PT, DPT 01/09/22, 4:50 PM  [facility] [address] [place], [place] [address] P: [phone] I F: [phone]

## 2022-01-10 DIAGNOSIS — N401 Enlarged prostate with lower urinary tract symptoms: Secondary | ICD-10-CM | POA: Diagnosis not present

## 2022-01-10 DIAGNOSIS — Z125 Encounter for screening for malignant neoplasm of prostate: Secondary | ICD-10-CM | POA: Diagnosis not present

## 2022-01-11 ENCOUNTER — Ambulatory Visit: Payer: Medicare Other | Admitting: Physical Therapy

## 2022-01-11 ENCOUNTER — Encounter: Payer: Self-pay | Admitting: Physical Therapy

## 2022-01-11 DIAGNOSIS — M546 Pain in thoracic spine: Secondary | ICD-10-CM | POA: Diagnosis not present

## 2022-01-11 DIAGNOSIS — R262 Difficulty in walking, not elsewhere classified: Secondary | ICD-10-CM

## 2022-01-11 DIAGNOSIS — M5459 Other low back pain: Secondary | ICD-10-CM

## 2022-01-11 DIAGNOSIS — R293 Abnormal posture: Secondary | ICD-10-CM

## 2022-01-11 NOTE — Therapy (Signed)
OUTPATIENT PHYSICAL THERAPY TREATMENT NOTE   Patient Name: Daniel [name] MRN: [id number] DOB:[birth year], 71 y.o., male Today's Date: 01/11/2022  PCP: Jerrol Banana., MD REFERRING PROVIDER: Allene Dillon, NP  END OF SESSION:   PT End of Session - 01/11/22 1619     Visit Number 7    Number of Visits 24    Date for PT Re-Evaluation 03/14/22    Authorization Type [employer] MEDICARE reporting period from 12/20/2021    Progress Note Due on Visit 10    PT Start Time 1613    PT Stop Time 1651    PT Time Calculation (min) 38 min    Activity Tolerance Patient tolerated treatment well;No increased pain    Behavior During Therapy [facility] for tasks assessed/performed              Past Medical History:  Diagnosis Date   Actinic keratosis    Asthma    Eczema    Hx of dysplastic nevus 06/06/2006   Mid lower back, slight atypia   Hypothyroidism    Seasonal allergies    Thyroid disease    Past Surgical History:  Procedure Laterality Date   CATARACT EXTRACTION     COLON SURGERY     COLONOSCOPY WITH PROPOFOL N/A 11/18/2014   Procedure: COLONOSCOPY WITH PROPOFOL;  Surgeon: [name], MD;  Location: [facility];  Service: Endoscopy;  Laterality: N/A;   COLONOSCOPY WITH PROPOFOL N/A 12/13/2021   Procedure: COLONOSCOPY WITH PROPOFOL;  Surgeon: [name], MD;  Location: [facility] END[facility];  Service: Endoscopy;  Laterality: N/A;   EYE SURGERY     KNEE SURGERY     NASAL SEPTUM SURGERY     Patient Active Problem List   Diagnosis Date Noted   Zenker's (hypopharyngeal) diverticulum 03/22/2018   Gastroesophageal reflux disease 03/22/2018   Fatty liver 10/09/2017   BPH (benign prostatic hyperplasia) 11/21/2014   Hypothyroidism 11/21/2014   Obesity 11/21/2014   Asthma 11/21/2014   Inguinal hernia 11/21/2014   OA (osteoarthritis) 11/21/2014   H/O adenomatous polyp of colon 10/14/2014    REFERRING DIAG: lumbar DDD (degenerative disc disease), lumbar stenosis with  neurogenic claudication, lumbar radiculitis  THERAPY DIAG:  Other low back pain  Pain in thoracic spine  Abnormal posture  Difficulty in walking, not elsewhere classified  Rationale for Evaluation and Treatment: Rehabilitation  PERTINENT HISTORY: Patient is a 71 y.o. male who presents to outpatient physical therapy with a referral for medical diagnosis lumbar DDD (degenerative disc disease, lumbar stenosis with neurogenic claudication, lumbar radiculitis. This patient's chief complaints consist of chronic pain in the low back and upper glutes that moves up towards his B scapulae with prolonged standing/walking and with intermittent shooting down his R left to the knee, leading to the following functional deficits: difficulty with anything that requires a lot of standing/walking, cooking for church/wine club, cleaning the house, waiting in line, standing longer than 5-20 min, having conversations with others while standing, yard work, Orthoptist, cleaning, sit <> stand, bending and lifting together, staining deck, shopping, walking more than 2 blocks, social participation, changing positions in the bed, golfing.    Relevant past medical history and comorbidities include asthma, Zenker's (hypopharyngeal) diverticulum, afib, GERD, hx of fatty liver, hypothyroidism, BPH, OA, hx adeomatous polyp of colon, inguinal hernia, L knee pain, hx of dysplastic nevus, colon surgery (7 inches removed of colon), cataract extraction, L knee pain.  Patient denies hx of cancer, stroke, seizures, lung problem, diabetes, unexplained weight loss,  unexplained changes in bowel or bladder problems, unexplained stumbling or dropping things, spinal surgery. He has been exercising daily.     PRECAUTIONS: none  SUBJECTIVE: Patient reports he is not having any pain currently and he was not sore after last PT session. He feels a little stiffer but still feels he is standing up straighter.    PAIN:  Are you having  pain? No  TODAY'S TREATMENT  Therapeutic exercise: to centralize symptoms and improve ROM, strength, muscular endurance, and activity tolerance required for successful completion of functional activities.  - standing wall sag, 2x20 - seated thoracic extension with B shoulder scaption holding PVC stick, towel roll behind thoracic spine, 3x15.  - standing trunk rotations with long PCV pipe resting on top of shoulders with arms draped on pipe (modified back squat position). 4x5 each direction.  - seated rows at [place] focusing on upper back extension and scapular retractoin, 3x10, 20# - standing slam ball front slam and pick up, 1x4 with 15# slam ball - standing front push toss with 15# slam ball, walk to it and pick up, 1x6 (felt in back) - standing with back to wall, B scaption with 2#DB in each hand attempting to keep back glued to back for good posture, 2x10 - prone press up, 3x10  Manual therapy: to reduce pain and tissue tension, improve range of motion, neuromodulation, in order to promote improved ability to complete functional activities. PRONE with face in table face cradle - CPA grade II-IV thoracic spine through L5 with KE wedge, 40-60 reps each segment.then alternating UPA 1x10reps per segment.  - CPA grade III-IV at lower cervical segments, 1x10 each side.   Pt required multimodal cuing for proper technique and to facilitate improved neuromuscular control, strength, range of motion, and functional ability resulting in improved performance and form.   PATIENT EDUCATION:  Education details: Exercise purpose/form. Self management techniques.  Person educated: Patient Education method: Explanation, demonstration, VC, TC Education comprehension: verbalized understanding, demonstrated understanding, and needs further education     HOME EXERCISE PROGRAM: Access Code: [email or web address] URL: [email or web address] Date: 12/28/2021 Prepared by: Rosita Kea  Exercises -  Sidelying Thoracic Rotation with Open Book  - 1-2 x daily - 3 sets - 10 reps - 5 seconds hold - Bridge  - 1-2 x daily - 3 sets - 10 reps - 5 seconds hold - Supine Lower Trunk Rotation  - 1 x daily - 3 sets - 15 reps - Prone Press Up  - 1-2 x daily - 3 sets - 10 reps - Bilateral Bent Leg Lift  - 1-2 x daily - 3 sets - 10 reps - Seated Thoracic Lumbar Extension  - Seated Flexion Stretch with Swiss Ball  - 1 x daily - 1 sets - 20 reps - 5 seconds hold   ASSESSMENT:   CLINICAL IMPRESSION: Patient arrives with continued improvement in posture. Gently progressed postural strengthening and continued manual therapy for improved spinal mobility. Patient tolerated interventions well with temporary pain in specific places in lumbar and thoracic spine. He had a lot of trouble keeping his shoulders back against the wall with standing scaption exercise and complained of his back feeling weak during this. Plan to continue working on postural strengthening and activity tolerance as well as spinal flexibility. Patient would benefit from continued management of limiting condition by skilled physical therapist to address remaining impairments and functional limitations to work towards stated goals and return to PLOF or maximal functional independence.   Patient is a  71 y.o. male referred to outpatient physical therapy with a medical diagnosis of lumbar DDD (degenerative disc disease, lumbar stenosis with neurogenic claudication, lumbar radiculitis who presents with signs and symptoms consistent with chronic low back and thoracic spine pain and stiffness with chronic kyphotic postural changes, hip extension weakness, and intermittent radicular symptoms contributing. Patient's pattern of symptoms suggests sustained spinal extension intolerance and difficulty with postural endurance is likely contributing to condition. Previous episode of care was able to improve patient's quality of life some with decreased pain and  improved function and patient has potential for improvement from his current state in these areas. Patient presents with significant pain, joint stiffness, posture, ROM, motor control, muscle performance (strength/power/endurance), and activity tolerance impairments that are limiting ability to complete his usual activities including anything that requires a lot of standing/walking, cooking for church/wine club, cleaning the house, waiting in line, standing longer than 5-20 min, having conversations with others while standing, yard work, Orthoptist, cleaning, sit <> stand, bending and lifting together, staining deck, shopping, walking more than 2 blocks, social participation, changing positions in the bed, golfing without difficulty. Patient will benefit from skilled physical therapy intervention to address current body structure impairments and activity limitations to improve function and work towards goals set in current POC in order to return to prior level of function or maximal functional improvement.      OBJECTIVE IMPAIRMENTS Abnormal gait, decreased activity tolerance, decreased endurance, decreased mobility, difficulty walking, decreased ROM, decreased strength, hypomobility, impaired perceived functional ability, increased muscle spasms, impaired flexibility, improper body mechanics, postural dysfunction, and pain.    ACTIVITY LIMITATIONS carrying, lifting, bending, standing, bed mobility, dressing, and locomotion level   PARTICIPATION LIMITATIONS: meal prep, cleaning, laundry, interpersonal relationship, shopping, community activity, yard work, church, and   asthma, Zenker's (hypopharyngeal) diverticulum, afib, GERD, hx of fatty liver, hypothyroidism, BPH, OA, hx adeomatous polyp of colon, inguinal hernia, L knee pain, hx of dysplastic nevus, colon surgery (7 inches removed of colon), cataract extraction, L knee pain   PERSONAL FACTORS Age, Past/current experiences, Time since onset  of injury/illness/exacerbation, and 3+ comorbidities:   asthma, Zenker's (hypopharyngeal) diverticulum, afib, GERD, hx of fatty liver, hypothyroidism, BPH, OA, hx adeomatous polyp of colon, inguinal hernia, L knee pain, hx of dysplastic nevus, colon surgery (7 inches removed of colon), cataract extraction, L knee pain are also affecting patient's functional outcome.    REHAB POTENTIAL: Good   CLINICAL DECISION MAKING: Stable/uncomplicated   EVALUATION COMPLEXITY: Low     GOALS: Goals reviewed with patient? No   SHORT TERM GOALS: Target date: 01/03/2022   Patient will be independent with initial home exercise program for self-management of symptoms. Baseline: Initial HEP to be provided at visit 2 as appropriate (12/20/21); initial HEP provided visit 2 (12/26/2021); Goal status: In-progress     LONG TERM GOALS: Target date: 03/14/2022   Patient will be independent with a long-term home exercise program for self-management of symptoms.  Baseline: Initial HEP to be provided at visit 2 as appropriate (12/20/21); initial HEP provided visit 2 (12/26/2021); Goal status: In-progress   2.  Patient will demonstrate improved FOTO to equal or greater than 67 by visit #11 to demonstrate improvement in overall condition and self-reported functional ability.  Baseline: 54 (12/20/21); Goal status: In-progress   3.  Patient will ambulate equal or greater than 1200 feet on 6 Minute Walk Test with no increase in symptoms to demonstrate improved community ambulation.  Baseline: to be tested visit 2 as  appropriate (12/20/21); 1,225' up to 2/10 NPS (12/26/2021);  Goal status: In-progress   4.  Patient will demonstrate the ability to lift 25# box floor to waist 1x10 times in a row with no increase in symptoms to improve his ability to bend and lift things around his home.  Baseline: reports difficulty with bending and lifting items at home  (12/20/21); Goal status: In-progress   5.  Patient will report the  ability to stand/walk equal or greater than 60 min before needing to sit to improve his ability to stand in line, socialize, and cook.  Baseline: 5-20 min (12/20/21); Goal status: In-progress     PLAN: PT FREQUENCY: 1-2x/week   PT DURATION: 12 weeks   PLANNED INTERVENTIONS: Therapeutic exercises, Therapeutic activity, Neuromuscular re-education, Patient/Family education, Joint mobilization, DME instructions, Dry Needling, Electrical stimulation, Spinal mobilization, Cryotherapy, Moist heat, Manual therapy, and Re-evaluation.   PLAN FOR NEXT SESSION: update HEP as appropriate, manual therapy and exercises for improved joint mobility in the spine.   Nancy Nordmann, PT, DPT 01/11/22, 8:55 PM  [facility] [address] [place], [place] [address] P: [phone] I F: [phone]

## 2022-01-16 ENCOUNTER — Ambulatory Visit: Payer: Medicare Other | Admitting: Physical Therapy

## 2022-01-16 ENCOUNTER — Encounter: Payer: Self-pay | Admitting: Physical Therapy

## 2022-01-16 DIAGNOSIS — R293 Abnormal posture: Secondary | ICD-10-CM | POA: Diagnosis not present

## 2022-01-16 DIAGNOSIS — R262 Difficulty in walking, not elsewhere classified: Secondary | ICD-10-CM | POA: Diagnosis not present

## 2022-01-16 DIAGNOSIS — M5459 Other low back pain: Secondary | ICD-10-CM | POA: Diagnosis not present

## 2022-01-16 DIAGNOSIS — M546 Pain in thoracic spine: Secondary | ICD-10-CM | POA: Diagnosis not present

## 2022-01-16 NOTE — Therapy (Signed)
OUTPATIENT PHYSICAL THERAPY TREATMENT NOTE   Patient Name: Daniel [name] MRN: [id number] DOB:January 17, 1951, 71 y.o., male Today's Date: 01/16/2022  PCP: Jerrol Banana., MD REFERRING PROVIDER: Allene Dillon, NP  END OF SESSION:   PT End of Session - 01/16/22 1925     Visit Number 8    Number of Visits 24    Date for PT Re-Evaluation 03/14/22    Authorization Type [employer] MEDICARE reporting period from 12/20/2021    Progress Note Due on Visit 10    PT Start Time 1610    PT Stop Time 1650    PT Time Calculation (min) 40 min    Activity Tolerance Patient tolerated treatment well;No increased pain    Behavior During Therapy [facility] for tasks assessed/performed             Past Medical History:  Diagnosis Date   Actinic keratosis    Asthma    Eczema    Hx of dysplastic nevus 06/06/2006   Mid lower back, slight atypia   Hypothyroidism    Seasonal allergies    Thyroid disease    Past Surgical History:  Procedure Laterality Date   CATARACT EXTRACTION     COLON SURGERY     COLONOSCOPY WITH PROPOFOL N/A 11/18/2014   Procedure: COLONOSCOPY WITH PROPOFOL;  Surgeon: [name], MD;  Location: [facility];  Service: Endoscopy;  Laterality: N/A;   COLONOSCOPY WITH PROPOFOL N/A 12/13/2021   Procedure: COLONOSCOPY WITH PROPOFOL;  Surgeon: [name], MD;  Location: [facility];  Service: Endoscopy;  Laterality: N/A;   EYE SURGERY     KNEE SURGERY     NASAL SEPTUM SURGERY     Patient Active Problem List   Diagnosis Date Noted   Zenker's (hypopharyngeal) diverticulum 03/22/2018   Gastroesophageal reflux disease 03/22/2018   Fatty liver 10/09/2017   BPH (benign prostatic hyperplasia) 11/21/2014   Hypothyroidism 11/21/2014   Obesity 11/21/2014   Asthma 11/21/2014   Inguinal hernia 11/21/2014   OA (osteoarthritis) 11/21/2014   H/O adenomatous polyp of colon 10/14/2014    REFERRING DIAG: lumbar DDD (degenerative disc disease), lumbar stenosis with  neurogenic claudication, lumbar radiculitis  THERAPY DIAG:  Other low back pain  Pain in thoracic spine  Abnormal posture  Difficulty in walking, not elsewhere classified  Rationale for Evaluation and Treatment: Rehabilitation  PERTINENT HISTORY: Patient is a 71 y.o. male who presents to outpatient physical therapy with a referral for medical diagnosis lumbar DDD (degenerative disc disease, lumbar stenosis with neurogenic claudication, lumbar radiculitis. This patient's chief complaints consist of chronic pain in the low back and upper glutes that moves up towards his B scapulae with prolonged standing/walking and with intermittent shooting down his R left to the knee, leading to the following functional deficits: difficulty with anything that requires a lot of standing/walking, cooking for church/wine club, cleaning the house, waiting in line, standing longer than 5-20 min, having conversations with others while standing, yard work, Orthoptist, cleaning, sit <> stand, bending and lifting together, staining deck, shopping, walking more than 2 blocks, social participation, changing positions in the bed, golfing.    Relevant past medical history and comorbidities include asthma, Zenker's (hypopharyngeal) diverticulum, afib, GERD, hx of fatty liver, hypothyroidism, BPH, OA, hx adeomatous polyp of colon, inguinal hernia, L knee pain, hx of dysplastic nevus, colon surgery (7 inches removed of colon), cataract extraction, L knee pain.  Patient denies hx of cancer, stroke, seizures, lung problem, diabetes, unexplained weight loss, unexplained  changes in bowel or bladder problems, unexplained stumbling or dropping things, spinal surgery. He has been exercising daily.     PRECAUTIONS: none  SUBJECTIVE: Patient arrives with no pain currently but describes pain by end of day yesterday after cleaning his house starting in the morning. He states he had some pain earlier today but it calmed down  while he was sitting in the waiting room.   PAIN:  Are you having pain? No  TODAY'S TREATMENT  Therapeutic exercise: to centralize symptoms and improve ROM, strength, muscular endurance, and activity tolerance required for successful completion of functional activities.  - standing wall sag, 1x20 - seated rows at [facility] machine focusing on upper back extension and scapular retractoin, 3x10, 35# - standing trunk rotations with long PCV pipe resting on top of shoulders with arms draped on pipe (modified back squat position). 1x20 each direction. - seated thoracic extension with hands behind head, towel roll behind thoracic spine, 3x10. - standing with back to wall, B scaption with 2#DB in each hand attempting to keep back glued to back for good posture, 3x10 - standing front push toss with 15# slam ball, walk to it and pick up, 1x6 (felt in back) - prone press up, 3x10  Manual therapy: to reduce pain and tissue tension, improve range of motion, neuromodulation, in order to promote improved ability to complete functional activities. SEATED - PROM B pec stretch with hands behind head, 3x30-40 seconds PRONE with face in table face cradle - CPA grade II-IV thoracic spine through L5 with KE wedge, 40 reps each segment.then alternating UPA 1x10reps per segment.  - CPA grade III-IV at lower cervical segments, 1x10 each side.   Pt required multimodal cuing for proper technique and to facilitate improved neuromuscular control, strength, range of motion, and functional ability resulting in improved performance and form.   PATIENT EDUCATION:  Education details: Exercise purpose/form. Self management techniques.  Person educated: Patient Education method: Explanation, demonstration, VC, TC Education comprehension: verbalized understanding, demonstrated understanding, and needs further education     HOME EXERCISE PROGRAM: Access Code: [email or web address] URL: [email or web address] Date:  12/28/2021 Prepared by: Rosita Kea  Exercises - Sidelying Thoracic Rotation with Open Book  - 1-2 x daily - 3 sets - 10 reps - 5 seconds hold - Bridge  - 1-2 x daily - 3 sets - 10 reps - 5 seconds hold - Supine Lower Trunk Rotation  - 1 x daily - 3 sets - 15 reps - Prone Press Up  - 1-2 x daily - 3 sets - 10 reps - Bilateral Bent Leg Lift  - 1-2 x daily - 3 sets - 10 reps - Seated Thoracic Lumbar Extension  - Seated Flexion Stretch with Swiss Ball  - 1 x daily - 1 sets - 20 reps - 5 seconds hold   ASSESSMENT:   CLINICAL IMPRESSION: Patient continues to reports mild improvements in activity tolerance since changing his meds and is able to tolerate more exercises targeting improved posture. Continues to have significant joint stiffness in the spine and difficulty maintaining upright posture. Patient would benefit from continued management of limiting condition by skilled physical therapist to address remaining impairments and functional limitations to work towards stated goals and return to PLOF or maximal functional independence.    Patient is a 71 y.o. male referred to outpatient physical therapy with a medical diagnosis of lumbar DDD (degenerative disc disease, lumbar stenosis with neurogenic claudication, lumbar radiculitis who presents with signs and symptoms consistent with chronic low  back and thoracic spine pain and stiffness with chronic kyphotic postural changes, hip extension weakness, and intermittent radicular symptoms contributing. Patient's pattern of symptoms suggests sustained spinal extension intolerance and difficulty with postural endurance is likely contributing to condition. Previous episode of care was able to improve patient's quality of life some with decreased pain and improved function and patient has potential for improvement from his current state in these areas. Patient presents with significant pain, joint stiffness, posture, ROM, motor control, muscle performance  (strength/power/endurance), and activity tolerance impairments that are limiting ability to complete his usual activities including anything that requires a lot of standing/walking, cooking for church/wine club, cleaning the house, waiting in line, standing longer than 5-20 min, having conversations with others while standing, yard work, Orthoptist, cleaning, sit <> stand, bending and lifting together, staining deck, shopping, walking more than 2 blocks, social participation, changing positions in the bed, golfing without difficulty. Patient will benefit from skilled physical therapy intervention to address current body structure impairments and activity limitations to improve function and work towards goals set in current POC in order to return to prior level of function or maximal functional improvement.      OBJECTIVE IMPAIRMENTS Abnormal gait, decreased activity tolerance, decreased endurance, decreased mobility, difficulty walking, decreased ROM, decreased strength, hypomobility, impaired perceived functional ability, increased muscle spasms, impaired flexibility, improper body mechanics, postural dysfunction, and pain.    ACTIVITY LIMITATIONS carrying, lifting, bending, standing, bed mobility, dressing, and locomotion level   PARTICIPATION LIMITATIONS: meal prep, cleaning, laundry, interpersonal relationship, shopping, community activity, yard work, church, and   asthma, Zenker's (hypopharyngeal) diverticulum, afib, GERD, hx of fatty liver, hypothyroidism, BPH, OA, hx adeomatous polyp of colon, inguinal hernia, L knee pain, hx of dysplastic nevus, colon surgery (7 inches removed of colon), cataract extraction, L knee pain   PERSONAL FACTORS Age, Past/current experiences, Time since onset of injury/illness/exacerbation, and 3+ comorbidities:   asthma, Zenker's (hypopharyngeal) diverticulum, afib, GERD, hx of fatty liver, hypothyroidism, BPH, OA, hx adeomatous polyp of colon, inguinal  hernia, L knee pain, hx of dysplastic nevus, colon surgery (7 inches removed of colon), cataract extraction, L knee pain are also affecting patient's functional outcome.    REHAB POTENTIAL: Good   CLINICAL DECISION MAKING: Stable/uncomplicated   EVALUATION COMPLEXITY: Low     GOALS: Goals reviewed with patient? No   SHORT TERM GOALS: Target date: 01/03/2022   Patient will be independent with initial home exercise program for self-management of symptoms. Baseline: Initial HEP to be provided at visit 2 as appropriate (12/20/21); initial HEP provided visit 2 (12/26/2021); Goal status: In-progress     LONG TERM GOALS: Target date: 03/14/2022   Patient will be independent with a long-term home exercise program for self-management of symptoms.  Baseline: Initial HEP to be provided at visit 2 as appropriate (12/20/21); initial HEP provided visit 2 (12/26/2021); Goal status: In-progress   2.  Patient will demonstrate improved FOTO to equal or greater than 67 by visit #11 to demonstrate improvement in overall condition and self-reported functional ability.  Baseline: 54 (12/20/21); Goal status: In-progress   3.  Patient will ambulate equal or greater than 1200 feet on 6 Minute Walk Test with no increase in symptoms to demonstrate improved community ambulation.  Baseline: to be tested visit 2 as appropriate (12/20/21); 1,225' up to 2/10 NPS (12/26/2021);  Goal status: In-progress   4.  Patient will demonstrate the ability to lift 25# box floor to waist 1x10 times in a row with no  increase in symptoms to improve his ability to bend and lift things around his home.  Baseline: reports difficulty with bending and lifting items at home  (12/20/21); Goal status: In-progress   5.  Patient will report the ability to stand/walk equal or greater than 60 min before needing to sit to improve his ability to stand in line, socialize, and cook.  Baseline: 5-20 min (12/20/21); Goal status: In-progress      PLAN: PT FREQUENCY: 1-2x/week   PT DURATION: 12 weeks   PLANNED INTERVENTIONS: Therapeutic exercises, Therapeutic activity, Neuromuscular re-education, Patient/Family education, Joint mobilization, DME instructions, Dry Needling, Electrical stimulation, Spinal mobilization, Cryotherapy, Moist heat, Manual therapy, and Re-evaluation.   PLAN FOR NEXT SESSION: update HEP as appropriate, manual therapy and exercises for improved joint mobility in the spine.   Nancy Nordmann, PT, DPT 01/16/22, 7:58 PM  [facility] [address] [place], [place] [address] P: [phone] I F: [phone]

## 2022-01-18 ENCOUNTER — Encounter: Payer: Self-pay | Admitting: Physical Therapy

## 2022-01-18 ENCOUNTER — Ambulatory Visit: Payer: Medicare Other | Admitting: Physical Therapy

## 2022-01-18 DIAGNOSIS — R293 Abnormal posture: Secondary | ICD-10-CM | POA: Diagnosis not present

## 2022-01-18 DIAGNOSIS — R262 Difficulty in walking, not elsewhere classified: Secondary | ICD-10-CM | POA: Diagnosis not present

## 2022-01-18 DIAGNOSIS — M546 Pain in thoracic spine: Secondary | ICD-10-CM | POA: Diagnosis not present

## 2022-01-18 DIAGNOSIS — M5459 Other low back pain: Secondary | ICD-10-CM

## 2022-01-18 NOTE — Therapy (Signed)
OUTPATIENT PHYSICAL THERAPY TREATMENT NOTE   Patient Name: Daniel [name] MRN: [id number] DOB:[birth year], 71 y.o., male Today's Date: 01/18/2022  PCP: Jerrol Banana., MD REFERRING PROVIDER: Allene Dillon, NP  END OF SESSION:   PT End of Session - 01/18/22 1621     Visit Number 9    Number of Visits 24    Date for PT Re-Evaluation 03/14/22    Authorization Type [employer] MEDICARE reporting period from 12/20/2021    Progress Note Due on Visit 10    PT Start Time 1603    PT Stop Time 1643    PT Time Calculation (min) 40 min    Activity Tolerance Patient tolerated treatment well    Behavior During Therapy [facility] for tasks assessed/performed             Past Medical History:  Diagnosis Date   Actinic keratosis    Asthma    Eczema    Hx of dysplastic nevus 06/06/2006   Mid lower back, slight atypia   Hypothyroidism    Seasonal allergies    Thyroid disease    Past Surgical History:  Procedure Laterality Date   CATARACT EXTRACTION     COLON SURGERY     COLONOSCOPY WITH PROPOFOL N/A 11/18/2014   Procedure: COLONOSCOPY WITH PROPOFOL;  Surgeon: [name], MD;  Location: [facility] ENDOSCOPY;  Service: Endoscopy;  Laterality: N/A;   COLONOSCOPY WITH PROPOFOL N/A 12/13/2021   Procedure: COLONOSCOPY WITH PROPOFOL;  Surgeon: [name], MD;  Location: [facility] ENDOSCOPY;  Service: Endoscopy;  Laterality: N/A;   EYE SURGERY     KNEE SURGERY     NASAL SEPTUM SURGERY     Patient Active Problem List   Diagnosis Date Noted   Zenker's (hypopharyngeal) diverticulum 03/22/2018   Gastroesophageal reflux disease 03/22/2018   Fatty liver 10/09/2017   BPH (benign prostatic hyperplasia) 11/21/2014   Hypothyroidism 11/21/2014   Obesity 11/21/2014   Asthma 11/21/2014   Inguinal hernia 11/21/2014   OA (osteoarthritis) 11/21/2014   H/O adenomatous polyp of colon 10/14/2014    REFERRING DIAG: lumbar DDD (degenerative disc disease), lumbar stenosis with neurogenic claudication,  lumbar radiculitis  THERAPY DIAG:  Other low back pain  Pain in thoracic spine  Abnormal posture  Difficulty in walking, not elsewhere classified  Rationale for Evaluation and Treatment: Rehabilitation  PERTINENT HISTORY: Patient is a 71 y.o. male who presents to outpatient physical therapy with a referral for medical diagnosis lumbar DDD (degenerative disc disease, lumbar stenosis with neurogenic claudication, lumbar radiculitis. This patient's chief complaints consist of chronic pain in the low back and upper glutes that moves up towards his B scapulae with prolonged standing/walking and with intermittent shooting down his R left to the knee, leading to the following functional deficits: difficulty with anything that requires a lot of standing/walking, cooking for church/wine club, cleaning the house, waiting in line, standing longer than 5-20 min, having conversations with others while standing, yard work, Orthoptist, cleaning, sit <> stand, bending and lifting together, staining deck, shopping, walking more than 2 blocks, social participation, changing positions in the bed, golfing.    Relevant past medical history and comorbidities include asthma, Zenker's (hypopharyngeal) diverticulum, afib, GERD, hx of fatty liver, hypothyroidism, BPH, OA, hx adeomatous polyp of colon, inguinal hernia, L knee pain, hx of dysplastic nevus, colon surgery (7 inches removed of colon), cataract extraction, L knee pain.  Patient denies hx of cancer, stroke, seizures, lung problem, diabetes, unexplained weight loss, unexplained changes in  bowel or bladder problems, unexplained stumbling or dropping things, spinal surgery. He has been exercising daily.     PRECAUTIONS: none  SUBJECTIVE: Patient arrives stating he currently has no pain but did have some left scapular pain earlier today. He felt okay yesterday and did some errands   PAIN:  Are you having pain? No  TODAY'S TREATMENT  Therapeutic  exercise: to centralize symptoms and improve ROM, strength, muscular endurance, and activity tolerance required for successful completion of functional activities.  - standing wall sag, 1x20 (Manual therapy - see below).  - prone scapular retraction with shoulders externally rotated with hands at sides, 3x10.  - prone press up, 3x10 - seated rows at [facility] machine focusing on upper back extension and scapular retractoin, 3x12, 35# - standing trunk rotations with long PCV pipe resting on top of shoulders with arms draped on pipe (modified back squat position). 3x20 each direction. - standing with back to wall, B scaption with 2#DB in each hand attempting to keep back glued to back for good posture, 3x10. - standing front push toss with 15# slam ball, walk to it and pick up, 1x10.  Manual therapy: to reduce pain and tissue tension, improve range of motion, neuromodulation, in order to promote improved ability to complete functional activities. PRONE with face in table face cradle - CPA grade II-IV thoracic spine through L5 with KE wedge, 40 reps each segment.then alternating UPA 1x10reps per segment.  SEATED - PROM B pec stretch with hands behind head and towel roll behind thoracic spine, 3x60 seconds   Pt required multimodal cuing for proper technique and to facilitate improved neuromuscular control, strength, range of motion, and functional ability resulting in improved performance and form.   PATIENT EDUCATION:  Education details: Exercise purpose/form. Self management techniques.  Reviewed cancelation/no-show policy with patient and confirmed patient has correct phone number for clinic; patient verbalized understanding (01/18/22). Person educated: Patient Education method: Explanation, demonstration, VC, TC Education comprehension: verbalized understanding, demonstrated understanding, and needs further education     HOME EXERCISE PROGRAM: Access Code: [email or web address] URL:  [email or web address] Date: 12/28/2021 Prepared by: Rosita Kea  Exercises - Sidelying Thoracic Rotation with Open Book  - 1-2 x daily - 3 sets - 10 reps - 5 seconds hold - Bridge  - 1-2 x daily - 3 sets - 10 reps - 5 seconds hold - Supine Lower Trunk Rotation  - 1 x daily - 3 sets - 15 reps - Prone Press Up  - 1-2 x daily - 3 sets - 10 reps - Bilateral Bent Leg Lift  - 1-2 x daily - 3 sets - 10 reps - Seated Thoracic Lumbar Extension  - Seated Flexion Stretch with Swiss Ball  - 1 x daily - 1 sets - 20 reps - 5 seconds hold   ASSESSMENT:   CLINICAL IMPRESSION: Patient continues to work on improving postural strength and range of motion. He was better able to maintain upright posture during scaption exercise against the wall but continues to have difficulty maintaining upright posture related to stiffness, weakness, and habit. Plan to complete 10th visit progress note next session. Patient would benefit from continued management of limiting condition by skilled physical therapist to address remaining impairments and functional limitations to work towards stated goals and return to PLOF or maximal functional independence.   Patient is a 71 y.o. male referred to outpatient physical therapy with a medical diagnosis of lumbar DDD (degenerative disc disease, lumbar stenosis with neurogenic claudication, lumbar radiculitis who presents  with signs and symptoms consistent with chronic low back and thoracic spine pain and stiffness with chronic kyphotic postural changes, hip extension weakness, and intermittent radicular symptoms contributing. Patient's pattern of symptoms suggests sustained spinal extension intolerance and difficulty with postural endurance is likely contributing to condition. Previous episode of care was able to improve patient's quality of life some with decreased pain and improved function and patient has potential for improvement from his current state in these areas.  Patient presents with significant pain, joint stiffness, posture, ROM, motor control, muscle performance (strength/power/endurance), and activity tolerance impairments that are limiting ability to complete his usual activities including anything that requires a lot of standing/walking, cooking for church/wine club, cleaning the house, waiting in line, standing longer than 5-20 min, having conversations with others while standing, yard work, Orthoptist, cleaning, sit <> stand, bending and lifting together, staining deck, shopping, walking more than 2 blocks, social participation, changing positions in the bed, golfing without difficulty. Patient will benefit from skilled physical therapy intervention to address current body structure impairments and activity limitations to improve function and work towards goals set in current POC in order to return to prior level of function or maximal functional improvement.      OBJECTIVE IMPAIRMENTS Abnormal gait, decreased activity tolerance, decreased endurance, decreased mobility, difficulty walking, decreased ROM, decreased strength, hypomobility, impaired perceived functional ability, increased muscle spasms, impaired flexibility, improper body mechanics, postural dysfunction, and pain.    ACTIVITY LIMITATIONS carrying, lifting, bending, standing, bed mobility, dressing, and locomotion level   PARTICIPATION LIMITATIONS: meal prep, cleaning, laundry, interpersonal relationship, shopping, community activity, yard work, church, and   asthma, Zenker's (hypopharyngeal) diverticulum, afib, GERD, hx of fatty liver, hypothyroidism, BPH, OA, hx adeomatous polyp of colon, inguinal hernia, L knee pain, hx of dysplastic nevus, colon surgery (7 inches removed of colon), cataract extraction, L knee pain   PERSONAL FACTORS Age, Past/current experiences, Time since onset of injury/illness/exacerbation, and 3+ comorbidities:   asthma, Zenker's (hypopharyngeal)  diverticulum, afib, GERD, hx of fatty liver, hypothyroidism, BPH, OA, hx adeomatous polyp of colon, inguinal hernia, L knee pain, hx of dysplastic nevus, colon surgery (7 inches removed of colon), cataract extraction, L knee pain are also affecting patient's functional outcome.    REHAB POTENTIAL: Good   CLINICAL DECISION MAKING: Stable/uncomplicated   EVALUATION COMPLEXITY: Low     GOALS: Goals reviewed with patient? No   SHORT TERM GOALS: Target date: 01/03/2022   Patient will be independent with initial home exercise program for self-management of symptoms. Baseline: Initial HEP to be provided at visit 2 as appropriate (12/20/21); initial HEP provided visit 2 (12/26/2021); Goal status: In-progress     LONG TERM GOALS: Target date: 03/14/2022   Patient will be independent with a long-term home exercise program for self-management of symptoms.  Baseline: Initial HEP to be provided at visit 2 as appropriate (12/20/21); initial HEP provided visit 2 (12/26/2021); Goal status: In-progress   2.  Patient will demonstrate improved FOTO to equal or greater than 67 by visit #11 to demonstrate improvement in overall condition and self-reported functional ability.  Baseline: 54 (12/20/21); Goal status: In-progress   3.  Patient will ambulate equal or greater than 1200 feet on 6 Minute Walk Test with no increase in symptoms to demonstrate improved community ambulation.  Baseline: to be tested visit 2 as appropriate (12/20/21); 1,225' up to 2/10 NPS (12/26/2021);  Goal status: In-progress   4.  Patient will demonstrate the ability to lift 25# box floor to  waist 1x10 times in a row with no increase in symptoms to improve his ability to bend and lift things around his home.  Baseline: reports difficulty with bending and lifting items at home  (12/20/21); Goal status: In-progress   5.  Patient will report the ability to stand/walk equal or greater than 60 min before needing to sit to improve his  ability to stand in line, socialize, and cook.  Baseline: 5-20 min (12/20/21); Goal status: In-progress     PLAN: PT FREQUENCY: 1-2x/week   PT DURATION: 12 weeks   PLANNED INTERVENTIONS: Therapeutic exercises, Therapeutic activity, Neuromuscular re-education, Patient/Family education, Joint mobilization, DME instructions, Dry Needling, Electrical stimulation, Spinal mobilization, Cryotherapy, Moist heat, Manual therapy, and Re-evaluation.   PLAN FOR NEXT SESSION: update HEP as appropriate, manual therapy and exercises for improved joint mobility in the spine.   Nancy Nordmann, PT, DPT 01/18/22, 4:56 PM  [facility] [address] [place],  [address] P: [phone] I F: [phone]

## 2022-01-23 ENCOUNTER — Encounter: Payer: Self-pay | Admitting: Family Medicine

## 2022-01-23 ENCOUNTER — Ambulatory Visit: Payer: Medicare Other | Admitting: Physical Therapy

## 2022-01-23 ENCOUNTER — Encounter: Payer: Self-pay | Admitting: Physical Therapy

## 2022-01-23 DIAGNOSIS — R262 Difficulty in walking, not elsewhere classified: Secondary | ICD-10-CM

## 2022-01-23 DIAGNOSIS — M5459 Other low back pain: Secondary | ICD-10-CM

## 2022-01-23 DIAGNOSIS — R293 Abnormal posture: Secondary | ICD-10-CM | POA: Diagnosis not present

## 2022-01-23 DIAGNOSIS — M546 Pain in thoracic spine: Secondary | ICD-10-CM

## 2022-01-23 NOTE — Therapy (Addendum)
OUTPATIENT PHYSICAL THERAPY TREATMENT / PROGRESS NOTE Dates of reporting from 12/20/2021 to 01/23/2022   Patient Name: Daniel [name] MRN: [id number] DOB:[birth year], 71 y.o., male Today's Date: 01/23/2022  PCP: Jerrol Banana., MD REFERRING PROVIDER: Allene Dillon, NP  END OF SESSION:   PT End of Session - 01/23/22 1609     Visit Number 10    Number of Visits 24    Date for PT Re-Evaluation 03/14/22    Authorization Type [employer] MEDICARE reporting period from 12/20/2021    Progress Note Due on Visit 10    PT Start Time 1605    PT Stop Time 1645    PT Time Calculation (min) 40 min    Activity Tolerance Patient tolerated treatment well    Behavior During Therapy [facility] for tasks assessed/performed              Past Medical History:  Diagnosis Date   Actinic keratosis    Asthma    Eczema    Hx of dysplastic nevus 06/06/2006   Mid lower back, slight atypia   Hypothyroidism    Seasonal allergies    Thyroid disease    Past Surgical History:  Procedure Laterality Date   CATARACT EXTRACTION     COLON SURGERY     COLONOSCOPY WITH PROPOFOL N/A 11/18/2014   Procedure: COLONOSCOPY WITH PROPOFOL;  Surgeon: [name], MD;  Location: [facility] ENDOSCOPY;  Service: Endoscopy;  Laterality: N/A;   COLONOSCOPY WITH PROPOFOL N/A 12/13/2021   Procedure: COLONOSCOPY WITH PROPOFOL;  Surgeon: [name], MD;  Location: [facility] ENDOSCOPY;  Service: Endoscopy;  Laterality: N/A;   EYE SURGERY     KNEE SURGERY     NASAL SEPTUM SURGERY     Patient Active Problem List   Diagnosis Date Noted   Zenker's (hypopharyngeal) diverticulum 03/22/2018   Gastroesophageal reflux disease 03/22/2018   Fatty liver 10/09/2017   BPH (benign prostatic hyperplasia) 11/21/2014   Hypothyroidism 11/21/2014   Obesity 11/21/2014   Asthma 11/21/2014   Inguinal hernia 11/21/2014   OA (osteoarthritis) 11/21/2014   H/O adenomatous polyp of colon 10/14/2014    REFERRING DIAG: lumbar DDD (degenerative  disc disease), lumbar stenosis with neurogenic claudication, lumbar radiculitis  THERAPY DIAG:  Other low back pain  Pain in thoracic spine  Abnormal posture  Difficulty in walking, not elsewhere classified  Rationale for Evaluation and Treatment: Rehabilitation  PERTINENT HISTORY: Patient is a 71 y.o. male who presents to outpatient physical therapy with a referral for medical diagnosis lumbar DDD (degenerative disc disease, lumbar stenosis with neurogenic claudication, lumbar radiculitis. This patient's chief complaints consist of chronic pain in the low back and upper glutes that moves up towards his B scapulae with prolonged standing/walking and with intermittent shooting down his R left to the knee, leading to the following functional deficits: difficulty with anything that requires a lot of standing/walking, cooking for church/wine club, cleaning the house, waiting in line, standing longer than 5-20 min, having conversations with others while standing, yard work, Orthoptist, cleaning, sit <> stand, bending and lifting together, staining deck, shopping, walking more than 2 blocks, social participation, changing positions in the bed, golfing.    Relevant past medical history and comorbidities include asthma, Zenker's (hypopharyngeal) diverticulum, afib, GERD, hx of fatty liver, hypothyroidism, BPH, OA, hx adeomatous polyp of colon, inguinal hernia, L knee pain, hx of dysplastic nevus, colon surgery (7 inches removed of colon), cataract extraction, L knee pain.  Patient denies hx of cancer, stroke,  seizures, lung problem, diabetes, unexplained weight loss, unexplained changes in bowel or bladder problems, unexplained stumbling or dropping things, spinal surgery. He has been exercising daily.     PRECAUTIONS: none  SUBJECTIVE: Patient arrives stating he has just had some soreness in his hip today but currently not hurting. He states his function is getting better and he fells the  salon patch on his back is helping a lot. He states his back feels less tight after doing his HEP at home. This improved tension is temporary.   PAIN:  Are you having pain? No  OBJECTIVE  SELF-REPORTED FUNCTION FOTO score: 62/100 (lumbar spine questionnaire)  FUNCTIONAL/BALANCE TEST 6 Minute Walk Test: 1610 feet with no AD, no complaint of pain while walking. Pain at waist when going to sit after walking. R knee pain.   - floor to waist lift with box 10-25# with 5# increase each rep, then 3 reps at 25#. Reported pain with each lift at low back increasing to 4/10 at 25# with request to stop.    TODAY'S TREATMENT  Therapeutic exercise: to centralize symptoms and improve ROM, strength, muscular endurance, and activity tolerance required for successful completion of functional activities.  - testing to assess progress (see above).   - standing wall sag, 1x20 - seated rows at [place] focusing on upper back extension and scapular retractoin, 3x12, 35# - standing with back to wall, B scaption with 2#DB in each hand attempting to keep back glued to back for good posture, 3x10. - standing front push toss with 15# slam ball, walk to it and pick up, 1x12  (Manual therapy - see below).   - prone press up, 2x10  Manual therapy: to reduce pain and tissue tension, improve range of motion, neuromodulation, in order to promote improved ability to complete functional activities. PRONE with face in table face cradle - CPA grade II-IV thoracic spine through L5 with KE wedge, 10-40 reps each segment.then alternating UPA 1x10 reps per segment.   Pt required multimodal cuing for proper technique and to facilitate improved neuromuscular control, strength, range of motion, and functional ability resulting in improved performance and form.   PATIENT EDUCATION:  Education details: Exercise purpose/form. Self management techniques.  Reviewed cancelation/no-show policy with patient and confirmed patient  has correct phone number for clinic; patient verbalized understanding (01/18/22). Person educated: Patient Education method: Explanation, demonstration, VC, TC Education comprehension: verbalized understanding, demonstrated understanding, and needs further education     HOME EXERCISE PROGRAM: Access Code: [email or web address] URL: [email or web address] Date: 12/28/2021 Prepared by: Rosita Kea  Exercises - Sidelying Thoracic Rotation with Open Book  - 1-2 x daily - 3 sets - 10 reps - 5 seconds hold - Bridge  - 1-2 x daily - 3 sets - 10 reps - 5 seconds hold - Supine Lower Trunk Rotation  - 1 x daily - 3 sets - 15 reps - Prone Press Up  - 1-2 x daily - 3 sets - 10 reps - Bilateral Bent Leg Lift  - 1-2 x daily - 3 sets - 10 reps - Seated Thoracic Lumbar Extension  - Seated Flexion Stretch with Swiss Ball  - 1 x daily - 1 sets - 20 reps - 5 seconds hold   ASSESSMENT:   CLINICAL IMPRESSION: Patient has attended 10 physical therapy sessions since starting this episode of care on 12/20/2021. Patient has met his 6 minute walk goal and nearly met standing for 60 minute goal. He continues to have a lot of postural stiffness  and cannot reach full upright standing, but he does demonstrate improved posture after each PT session. Since his change in medications carry over between PT sessions has improved slightly. Patient participates well in clinic and with his HEP. Patient would benefit from continued management of limiting condition by skilled physical therapist to address remaining impairments and functional limitations to work towards stated goals and return to PLOF or maximal functional independence.   Patient is a 71 y.o. male referred to outpatient physical therapy with a medical diagnosis of lumbar DDD (degenerative disc disease, lumbar stenosis with neurogenic claudication, lumbar radiculitis who presents with signs and symptoms consistent with chronic low back and thoracic spine pain and  stiffness with chronic kyphotic postural changes, hip extension weakness, and intermittent radicular symptoms contributing. Patient's pattern of symptoms suggests sustained spinal extension intolerance and difficulty with postural endurance is likely contributing to condition. Previous episode of care was able to improve patient's quality of life some with decreased pain and improved function and patient has potential for improvement from his current state in these areas. Patient presents with significant pain, joint stiffness, posture, ROM, motor control, muscle performance (strength/power/endurance), and activity tolerance impairments that are limiting ability to complete his usual activities including anything that requires a lot of standing/walking, cooking for church/wine club, cleaning the house, waiting in line, standing longer than 5-20 min, having conversations with others while standing, yard work, Orthoptist, cleaning, sit <> stand, bending and lifting together, staining deck, shopping, walking more than 2 blocks, social participation, changing positions in the bed, golfing without difficulty. Patient will benefit from skilled physical therapy intervention to address current body structure impairments and activity limitations to improve function and work towards goals set in current POC in order to return to prior level of function or maximal functional improvement.      OBJECTIVE IMPAIRMENTS Abnormal gait, decreased activity tolerance, decreased endurance, decreased mobility, difficulty walking, decreased ROM, decreased strength, hypomobility, impaired perceived functional ability, increased muscle spasms, impaired flexibility, improper body mechanics, postural dysfunction, and pain.    ACTIVITY LIMITATIONS carrying, lifting, bending, standing, bed mobility, dressing, and locomotion level   PARTICIPATION LIMITATIONS: meal prep, cleaning, laundry, interpersonal relationship, shopping,  community activity, yard work, church, and   asthma, Zenker's (hypopharyngeal) diverticulum, afib, GERD, hx of fatty liver, hypothyroidism, BPH, OA, hx adeomatous polyp of colon, inguinal hernia, L knee pain, hx of dysplastic nevus, colon surgery (7 inches removed of colon), cataract extraction, L knee pain   PERSONAL FACTORS Age, Past/current experiences, Time since onset of injury/illness/exacerbation, and 3+ comorbidities:   asthma, Zenker's (hypopharyngeal) diverticulum, afib, GERD, hx of fatty liver, hypothyroidism, BPH, OA, hx adeomatous polyp of colon, inguinal hernia, L knee pain, hx of dysplastic nevus, colon surgery (7 inches removed of colon), cataract extraction, L knee pain are also affecting patient's functional outcome.    REHAB POTENTIAL: Good   CLINICAL DECISION MAKING: Stable/uncomplicated   EVALUATION COMPLEXITY: Low     GOALS: Goals reviewed with patient? No   SHORT TERM GOALS: Target date: 01/03/2022   Patient will be independent with initial home exercise program for self-management of symptoms. Baseline: Initial HEP to be provided at visit 2 as appropriate (12/20/21); initial HEP provided visit 2 (12/26/2021);  Goal status: met   LONG TERM GOALS: Target date: 03/14/2022   Patient will be independent with a long-term home exercise program for self-management of symptoms.  Baseline: Initial HEP to be provided at visit 2 as appropriate (12/20/21); initial HEP provided visit 2 (  12/26/2021); participating in HEP (01/23/2022);  Goal status: In-progress   2.  Patient will demonstrate improved FOTO to equal or greater than 67 by visit #11 to demonstrate improvement in overall condition and self-reported functional ability.  Baseline: 54 (12/20/21); 62 at visit # 10 (01/23/2022);  Goal status: In-progress   3.  Patient will ambulate equal or greater than 1200 feet on 6 Minute Walk Test with no increase in symptoms to demonstrate improved community ambulation.  Baseline: to be  tested visit 2 as appropriate (12/20/21); 1,225' up to 2/10 NPS (12/26/2021); 1263 feet with no AD, no complaint of pain while walking. Pain at waist when going to sit after walking. R knee pain (01/23/2022);  Goal status: Met 01/23/2022   4.  Patient will demonstrate the ability to lift 25# box floor to waist 1x10 times in a row with no increase in symptoms to improve his ability to bend and lift things around his home.  Baseline: reports difficulty with bending and lifting items at home  (12/20/21); 3 reps at 25#. Reported pain with each lift at low back increasing to 4/10 at 25# with request to stop (01/23/2022);  Goal status: In-progress   5.  Patient will report the ability to stand/walk equal or greater than 60 min before needing to sit to improve his ability to stand in line, socialize, and cook.  Baseline: 5-20 min (12/20/21); on his feet 60 min with salon pass on his back making hashbrown casserole this morning before he started to get pain, he feels he would not be able to do this without the salon pass (01/23/2022):  Goal status: Nearly met.      PLAN: PT FREQUENCY: 1-2x/week   PT DURATION: 12 weeks   PLANNED INTERVENTIONS: Therapeutic exercises, Therapeutic activity, Neuromuscular re-education, Patient/Family education, Joint mobilization, DME instructions, Dry Needling, Electrical stimulation, Spinal mobilization, Cryotherapy, Moist heat, Manual therapy, and Re-evaluation.   PLAN FOR NEXT SESSION: update HEP as appropriate, manual therapy and exercises for improved joint mobility in the spine.   Nancy Nordmann, PT, DPT 01/23/22, 4:52 PM  [facility] [address] [place], [place] [address] P: [phone] I F: [phone]

## 2022-01-25 ENCOUNTER — Ambulatory Visit: Payer: Medicare Other | Admitting: Physical Therapy

## 2022-01-25 ENCOUNTER — Encounter: Payer: Self-pay | Admitting: Physical Therapy

## 2022-01-25 DIAGNOSIS — R293 Abnormal posture: Secondary | ICD-10-CM | POA: Diagnosis not present

## 2022-01-25 DIAGNOSIS — M5459 Other low back pain: Secondary | ICD-10-CM | POA: Diagnosis not present

## 2022-01-25 DIAGNOSIS — M546 Pain in thoracic spine: Secondary | ICD-10-CM | POA: Diagnosis not present

## 2022-01-25 DIAGNOSIS — R262 Difficulty in walking, not elsewhere classified: Secondary | ICD-10-CM | POA: Diagnosis not present

## 2022-01-25 NOTE — Therapy (Signed)
OUTPATIENT PHYSICAL THERAPY TREATMENT NOTE  Patient Name: Daniel [name] MRN: [id number] DOB:Sep 25, 1950, 71 y.o., male Today's Date: 01/25/2022  PCP: Jerrol Banana., MD REFERRING PROVIDER: Allene Dillon, NP  END OF SESSION:   PT End of Session - 01/25/22 1628     Visit Number 11    Number of Visits 24    Date for PT Re-Evaluation 03/14/22    Authorization Type [employer] MEDICARE reporting period from 01/23/2022    Progress Note Due on Visit 20    PT Start Time 1605    PT Stop Time 1645    PT Time Calculation (min) 40 min    Activity Tolerance Patient tolerated treatment well    Behavior During Therapy [facility] for tasks assessed/performed               Past Medical History:  Diagnosis Date   Actinic keratosis    Asthma    Eczema    Hx of dysplastic nevus 06/06/2006   Mid lower back, slight atypia   Hypothyroidism    Seasonal allergies    Thyroid disease    Past Surgical History:  Procedure Laterality Date   CATARACT EXTRACTION     COLON SURGERY     COLONOSCOPY WITH PROPOFOL N/A 11/18/2014   Procedure: COLONOSCOPY WITH PROPOFOL;  Surgeon: [name], MD;  Location: [facility] ENDOSCOPY;  Service: Endoscopy;  Laterality: N/A;   COLONOSCOPY WITH PROPOFOL N/A 12/13/2021   Procedure: COLONOSCOPY WITH PROPOFOL;  Surgeon: [name], MD;  Location: [facility] ENDOSCOPY;  Service: Endoscopy;  Laterality: N/A;   EYE SURGERY     KNEE SURGERY     NASAL SEPTUM SURGERY     Patient Active Problem List   Diagnosis Date Noted   Zenker's (hypopharyngeal) diverticulum 03/22/2018   Gastroesophageal reflux disease 03/22/2018   Fatty liver 10/09/2017   BPH (benign prostatic hyperplasia) 11/21/2014   Hypothyroidism 11/21/2014   Obesity 11/21/2014   Asthma 11/21/2014   Inguinal hernia 11/21/2014   OA (osteoarthritis) 11/21/2014   H/O adenomatous polyp of colon 10/14/2014    REFERRING DIAG: lumbar DDD (degenerative disc disease), lumbar stenosis with neurogenic  claudication, lumbar radiculitis  THERAPY DIAG:  Other low back pain  Pain in thoracic spine  Abnormal posture  Difficulty in walking, not elsewhere classified  Rationale for Evaluation and Treatment: Rehabilitation  PERTINENT HISTORY: Patient is a 71 y.o. male who presents to outpatient physical therapy with a referral for medical diagnosis lumbar DDD (degenerative disc disease, lumbar stenosis with neurogenic claudication, lumbar radiculitis. This patient's chief complaints consist of chronic pain in the low back and upper glutes that moves up towards his B scapulae with prolonged standing/walking and with intermittent shooting down his R left to the knee, leading to the following functional deficits: difficulty with anything that requires a lot of standing/walking, cooking for church/wine club, cleaning the house, waiting in line, standing longer than 5-20 min, having conversations with others while standing, yard work, Orthoptist, cleaning, sit <> stand, bending and lifting together, staining deck, shopping, walking more than 2 blocks, social participation, changing positions in the bed, golfing.    Relevant past medical history and comorbidities include asthma, Zenker's (hypopharyngeal) diverticulum, afib, GERD, hx of fatty liver, hypothyroidism, BPH, OA, hx adeomatous polyp of colon, inguinal hernia, L knee pain, hx of dysplastic nevus, colon surgery (7 inches removed of colon), cataract extraction, L knee pain.  Patient denies hx of cancer, stroke, seizures, lung problem, diabetes, unexplained weight loss, unexplained changes  in bowel or bladder problems, unexplained stumbling or dropping things, spinal surgery. He has been exercising daily.     PRECAUTIONS: none  SUBJECTIVE: Patient reports he is a little sore upon arrival due to being very busy day. He states he was not sore after last PT session. He feels stiff upon arrival.   PAIN:  Are you having pain? Yes ache of 1-2/10  in top of both glutes  OBJECTIVE    TODAY'S TREATMENT  Therapeutic exercise: to centralize symptoms and improve ROM, strength, muscular endurance, and activity tolerance required for successful completion of functional activities.  - standing wall sag, 3x30 - seated rows at [facility] machine focusing on upper back extension and scapular retractoin, 3x12, 35# (Manual therapy - see below).   - prone press up, 3x10 - prone scapular retraction with palms on mat thumbs out, 3x10 VC/TC for improved ROM of scapula.  - standing B shoulder flexion to 90 deg with 2# DB in each hand while holding small ball on wall with back of head with body not touching wall.  - standing pec stretch in door, 3x30 seconds - seated lat pulls, 3x10 with 35# with cuing to improve posture.  - seated upper thoracic extension stretch while holding lat pull bar overhead and leaning forwards, approx 5 min spent trying different positions to stretch better.   Manual therapy: to reduce pain and tissue tension, improve range of motion, neuromodulation, in order to promote improved ability to complete functional activities. PRONE with face in table face cradle - CPA grade II-IV thoracic spine through L5 with KE wedge, 40 reps each segment.then alternating UPA 1x10 reps per segment.  - CPA 1x40 and alternating UPA 1x10 each side at C7, grade III-IV  Pt required multimodal cuing for proper technique and to facilitate improved neuromuscular control, strength, range of motion, and functional ability resulting in improved performance and form.   PATIENT EDUCATION:  Education details: Exercise purpose/form. Self management techniques.  Reviewed cancelation/no-show policy with patient and confirmed patient has correct phone number for clinic; patient verbalized understanding (01/18/22). Person educated: Patient Education method: Explanation, demonstration, VC, TC Education comprehension: verbalized understanding, demonstrated  understanding, and needs further education     HOME EXERCISE PROGRAM: Access Code: [email or web address] URL: [email or web address] Date: 12/28/2021 Prepared by: Rosita Kea  Exercises - Sidelying Thoracic Rotation with Open Book  - 1-2 x daily - 3 sets - 10 reps - 5 seconds hold - Bridge  - 1-2 x daily - 3 sets - 10 reps - 5 seconds hold - Supine Lower Trunk Rotation  - 1 x daily - 3 sets - 15 reps - Prone Press Up  - 1-2 x daily - 3 sets - 10 reps - Bilateral Bent Leg Lift  - 1-2 x daily - 3 sets - 10 reps - Seated Thoracic Lumbar Extension  - Seated Flexion Stretch with Swiss Ball  - 1 x daily - 1 sets - 20 reps - 5 seconds hold   ASSESSMENT:   CLINICAL IMPRESSION: Patient tolerated treatment well overall and reported he feels "taller" by end of session.  Patient continues to struggle with upright posture that likely increases his low back pain and difficulty standing. He continues to benefit from manual therapy and exercise interventions to help improve his posture and activity tolerance. Patient would benefit from continued management of limiting condition by skilled physical therapist to address remaining impairments and functional limitations to work towards stated goals and return to PLOF or maximal functional independence.  Patient is a 71 y.o. male referred to outpatient physical therapy with a medical diagnosis of lumbar DDD (degenerative disc disease, lumbar stenosis with neurogenic claudication, lumbar radiculitis who presents with signs and symptoms consistent with chronic low back and thoracic spine pain and stiffness with chronic kyphotic postural changes, hip extension weakness, and intermittent radicular symptoms contributing. Patient's pattern of symptoms suggests sustained spinal extension intolerance and difficulty with postural endurance is likely contributing to condition. Previous episode of care was able to improve patient's quality of life some with decreased pain  and improved function and patient has potential for improvement from his current state in these areas. Patient presents with significant pain, joint stiffness, posture, ROM, motor control, muscle performance (strength/power/endurance), and activity tolerance impairments that are limiting ability to complete his usual activities including anything that requires a lot of standing/walking, cooking for church/wine club, cleaning the house, waiting in line, standing longer than 5-20 min, having conversations with others while standing, yard work, Orthoptist, cleaning, sit <> stand, bending and lifting together, staining deck, shopping, walking more than 2 blocks, social participation, changing positions in the bed, golfing without difficulty. Patient will benefit from skilled physical therapy intervention to address current body structure impairments and activity limitations to improve function and work towards goals set in current POC in order to return to prior level of function or maximal functional improvement.      OBJECTIVE IMPAIRMENTS Abnormal gait, decreased activity tolerance, decreased endurance, decreased mobility, difficulty walking, decreased ROM, decreased strength, hypomobility, impaired perceived functional ability, increased muscle spasms, impaired flexibility, improper body mechanics, postural dysfunction, and pain.    ACTIVITY LIMITATIONS carrying, lifting, bending, standing, bed mobility, dressing, and locomotion level   PARTICIPATION LIMITATIONS: meal prep, cleaning, laundry, interpersonal relationship, shopping, community activity, yard work, church, and   asthma, Zenker's (hypopharyngeal) diverticulum, afib, GERD, hx of fatty liver, hypothyroidism, BPH, OA, hx adeomatous polyp of colon, inguinal hernia, L knee pain, hx of dysplastic nevus, colon surgery (7 inches removed of colon), cataract extraction, L knee pain   PERSONAL FACTORS Age, Past/current experiences, Time since  onset of injury/illness/exacerbation, and 3+ comorbidities:   asthma, Zenker's (hypopharyngeal) diverticulum, afib, GERD, hx of fatty liver, hypothyroidism, BPH, OA, hx adeomatous polyp of colon, inguinal hernia, L knee pain, hx of dysplastic nevus, colon surgery (7 inches removed of colon), cataract extraction, L knee pain are also affecting patient's functional outcome.    REHAB POTENTIAL: Good   CLINICAL DECISION MAKING: Stable/uncomplicated   EVALUATION COMPLEXITY: Low     GOALS: Goals reviewed with patient? No   SHORT TERM GOALS: Target date: 01/03/2022   Patient will be independent with initial home exercise program for self-management of symptoms. Baseline: Initial HEP to be provided at visit 2 as appropriate (12/20/21); initial HEP provided visit 2 (12/26/2021);  Goal status: met   LONG TERM GOALS: Target date: 03/14/2022   Patient will be independent with a long-term home exercise program for self-management of symptoms.  Baseline: Initial HEP to be provided at visit 2 as appropriate (12/20/21); initial HEP provided visit 2 (12/26/2021); participating in HEP (01/23/2022);  Goal status: In-progress   2.  Patient will demonstrate improved FOTO to equal or greater than 67 by visit #11 to demonstrate improvement in overall condition and self-reported functional ability.  Baseline: 54 (12/20/21); 62 at visit # 10 (01/23/2022);  Goal status: In-progress   3.  Patient will ambulate equal or greater than 1200 feet on 6 Minute Walk Test with no increase in  symptoms to demonstrate improved community ambulation.  Baseline: to be tested visit 2 as appropriate (12/20/21); 1,225' up to 2/10 NPS (12/26/2021); 1263 feet with no AD, no complaint of pain while walking. Pain at waist when going to sit after walking. R knee pain (01/23/2022);  Goal status: Met 01/23/2022   4.  Patient will demonstrate the ability to lift 25# box floor to waist 1x10 times in a row with no increase in symptoms to improve  his ability to bend and lift things around his home.  Baseline: reports difficulty with bending and lifting items at home  (12/20/21); 3 reps at 25#. Reported pain with each lift at low back increasing to 4/10 at 25# with request to stop (01/23/2022);  Goal status: In-progress   5.  Patient will report the ability to stand/walk equal or greater than 60 min before needing to sit to improve his ability to stand in line, socialize, and cook.  Baseline: 5-20 min (12/20/21); on his feet 60 min with salon pass on his back making hashbrown casserole this morning before he started to get pain, he feels he would not be able to do this without the salon pass (01/23/2022):  Goal status: Nearly met.      PLAN: PT FREQUENCY: 1-2x/week   PT DURATION: 12 weeks   PLANNED INTERVENTIONS: Therapeutic exercises, Therapeutic activity, Neuromuscular re-education, Patient/Family education, Joint mobilization, DME instructions, Dry Needling, Electrical stimulation, Spinal mobilization, Cryotherapy, Moist heat, Manual therapy, and Re-evaluation.   PLAN FOR NEXT SESSION: update HEP as appropriate, manual therapy and exercises for improved joint mobility in the spine.   Nancy Nordmann, PT, DPT 01/25/22, 8:21 PM  [facility] [address] [place], [place] [address] P: [phone] I F: [phone]

## 2022-01-31 ENCOUNTER — Other Ambulatory Visit: Payer: Self-pay | Admitting: Family Medicine

## 2022-01-31 MED ORDER — PREDNISONE 20 MG PO TABS: 20.0000 mg | ORAL_TABLET | Freq: Every day | ORAL | 1 refills | Status: DC

## 2022-01-31 NOTE — Telephone Encounter (Signed)
Pt called back saying he has not heard back about his question about predisone.  He would like for someone to call him back.  CB@ [phone]

## 2022-02-01 ENCOUNTER — Ambulatory Visit: Payer: Medicare Other | Admitting: Physical Therapy

## 2022-02-06 ENCOUNTER — Encounter: Payer: Medicare Other | Admitting: Physical Therapy

## 2022-02-08 ENCOUNTER — Ambulatory Visit: Payer: Medicare Other | Attending: Family Medicine | Admitting: Physical Therapy

## 2022-02-08 ENCOUNTER — Encounter: Payer: Self-pay | Admitting: Physical Therapy

## 2022-02-08 DIAGNOSIS — R293 Abnormal posture: Secondary | ICD-10-CM | POA: Diagnosis not present

## 2022-02-08 DIAGNOSIS — M5459 Other low back pain: Secondary | ICD-10-CM | POA: Diagnosis not present

## 2022-02-08 DIAGNOSIS — M546 Pain in thoracic spine: Secondary | ICD-10-CM | POA: Diagnosis not present

## 2022-02-08 DIAGNOSIS — R262 Difficulty in walking, not elsewhere classified: Secondary | ICD-10-CM | POA: Insufficient documentation

## 2022-02-08 NOTE — Therapy (Signed)
OUTPATIENT PHYSICAL THERAPY TREATMENT NOTE  Patient Name: Daniel [name] MRN: [id number] DOB:04/28/1951, 71 y.o., male Today's Date: 02/08/2022  PCP: Jerrol Banana., MD REFERRING PROVIDER: Allene Dillon, NP  END OF SESSION:   PT End of Session - 02/08/22 2030     Visit Number 12    Number of Visits 24    Date for PT Re-Evaluation 03/14/22    Authorization Type [employer] MEDICARE reporting period from 01/23/2022    Progress Note Due on Visit 20    PT Start Time 1610    PT Stop Time 1648    PT Time Calculation (min) 38 min    Activity Tolerance Patient tolerated treatment well    Behavior During Therapy [facility] for tasks assessed/performed                Past Medical History:  Diagnosis Date   Actinic keratosis    Asthma    Eczema    Hx of dysplastic nevus 06/06/2006   Mid lower back, slight atypia   Hypothyroidism    Seasonal allergies    Thyroid disease    Past Surgical History:  Procedure Laterality Date   CATARACT EXTRACTION     COLON SURGERY     COLONOSCOPY WITH PROPOFOL N/A 11/18/2014   Procedure: COLONOSCOPY WITH PROPOFOL;  Surgeon: [name], MD;  Location: [place];  Service: Endoscopy;  Laterality: N/A;   COLONOSCOPY WITH PROPOFOL N/A 12/13/2021   Procedure: COLONOSCOPY WITH PROPOFOL;  Surgeon: [name], MD;  Location: [facility];  Service: Endoscopy;  Laterality: N/A;   EYE SURGERY     KNEE SURGERY     NASAL SEPTUM SURGERY     Patient Active Problem List   Diagnosis Date Noted   Zenker's (hypopharyngeal) diverticulum 03/22/2018   Gastroesophageal reflux disease 03/22/2018   Fatty liver 10/09/2017   BPH (benign prostatic hyperplasia) 11/21/2014   Hypothyroidism 11/21/2014   Obesity 11/21/2014   Asthma 11/21/2014   Inguinal hernia 11/21/2014   OA (osteoarthritis) 11/21/2014   H/O adenomatous polyp of colon 10/14/2014    REFERRING DIAG: lumbar DDD (degenerative disc disease), lumbar stenosis with neurogenic  claudication, lumbar radiculitis  THERAPY DIAG:  Other low back pain  Pain in thoracic spine  Abnormal posture  Difficulty in walking, not elsewhere classified  Rationale for Evaluation and Treatment: Rehabilitation  PERTINENT HISTORY: Patient is a 71 y.o. male who presents to outpatient physical therapy with a referral for medical diagnosis lumbar DDD (degenerative disc disease, lumbar stenosis with neurogenic claudication, lumbar radiculitis. This patient's chief complaints consist of chronic pain in the low back and upper glutes that moves up towards his B scapulae with prolonged standing/walking and with intermittent shooting down his R left to the knee, leading to the following functional deficits: difficulty with anything that requires a lot of standing/walking, cooking for church/wine club, cleaning the house, waiting in line, standing longer than 5-20 min, having conversations with others while standing, yard work, Orthoptist, cleaning, sit <> stand, bending and lifting together, staining deck, shopping, walking more than 2 blocks, social participation, changing positions in the bed, golfing.    Relevant past medical history and comorbidities include asthma, Zenker's (hypopharyngeal) diverticulum, afib, GERD, hx of fatty liver, hypothyroidism, BPH, OA, hx adeomatous polyp of colon, inguinal hernia, L knee pain, hx of dysplastic nevus, colon surgery (7 inches removed of colon), cataract extraction, L knee pain.  Patient denies hx of cancer, stroke, seizures, lung problem, diabetes, unexplained weight loss, unexplained  changes in bowel or bladder problems, unexplained stumbling or dropping things, spinal surgery. He has been exercising daily.     PRECAUTIONS: none  SUBJECTIVE: Patient reports he did a lot of walking on his trip and his back is a little stiff. He missed his last PT appointment prior to the trip due to a stomach bug. Gabapentin and tramadol helps control the pain  until the end of the day. He is also feeling like this is affecting his neck where it feels like he wants to fall forwards. He took prednisone pill a day to help with his back.   PAIN:  Are you having pain? No just stiff.   OBJECTIVE    TODAY'S TREATMENT  Therapeutic exercise: to centralize symptoms and improve ROM, strength, muscular endurance, and activity tolerance required for successful completion of functional activities.  - standing wall sag, 1x20 (Manual therapy - see below).   - prone press up, 3x10 - prone baby cobra with cervical retraction and hand hover, 3x10 with 5 second holds. VC/TC for improved scapular and cervical spine retraction and hold time.  - Education on HEP including handout    Manual therapy: to reduce pain and tissue tension, improve range of motion, neuromodulation, in order to promote improved ability to complete functional activities. PRONE with face in table face cradle - CPA grade II-IV thoracic spine through L5 with KE wedge, 20-40 reps each segment.then alternating UPA 1x10 reps per segment.  - CPA 1x40 and alternating UPA 1x10 each side at C7, grade III-IV  Pt required multimodal cuing for proper technique and to facilitate improved neuromuscular control, strength, range of motion, and functional ability resulting in improved performance and form.   PATIENT EDUCATION:  Education details: Exercise purpose/form. Self management techniques.  Reviewed cancelation/no-show policy with patient and confirmed patient has correct phone number for clinic; patient verbalized understanding (01/18/22). Person educated: Patient Education method: Explanation, demonstration, VC, TC Education comprehension: verbalized understanding, demonstrated understanding, and needs further education     HOME EXERCISE PROGRAM: Access Code: [email or web address] URL: [email or web address] Date: 02/08/2022 Prepared by: Rosita Kea  Exercises - Sidelying Thoracic Rotation  with Open Book  - 1-2 x daily - 3 sets - 10 reps - 5 seconds hold - Bridge  - 1-2 x daily - 3 sets - 10 reps - 5 seconds hold - Supine Lower Trunk Rotation  - 1 x daily - 3 sets - 15 reps - Prone Press Up  - 1-2 x daily - 3 sets - 10 reps - Bilateral Bent Leg Lift  - 1-2 x daily - 3 sets - 10 reps - Seated Thoracic Lumbar Extension  - Seated Flexion Stretch with Swiss Ball  - 1 x daily - 1 sets - 20 reps - 5 seconds hold - Baby Cobra Hands Hovering   - 1 x daily - 3 sets - 10 reps - 5 seconds hold   ASSESSMENT:   CLINICAL IMPRESSION: Patient arrives with worsened posture and complaint of feeling like his back is really stiff and falling forwards at his neck after being away on vacation last week (last seen 2 weeks ago). Session focused on improving spinal mobility for extension and postural strength. Patient was able to complete baby cobra with hovering hands with difficulty due to fatigue. He reports pain while performing baby cobra at right low back and glute that does not linger afterwards. Plan to continue with exercises for improving postural strength and mobility. Patient would benefit from continued management of limiting condition  by skilled physical therapist to address remaining impairments and functional limitations to work towards stated goals and return to PLOF or maximal functional independence.     From PT eval 12/20/2021:  Patient is a 72 y.o. male referred to outpatient physical therapy with a medical diagnosis of lumbar DDD (degenerative disc disease, lumbar stenosis with neurogenic claudication, lumbar radiculitis who presents with signs and symptoms consistent with chronic low back and thoracic spine pain and stiffness with chronic kyphotic postural changes, hip extension weakness, and intermittent radicular symptoms contributing. Patient's pattern of symptoms suggests sustained spinal extension intolerance and difficulty with postural endurance is likely contributing to condition.  Previous episode of care was able to improve patient's quality of life some with decreased pain and improved function and patient has potential for improvement from his current state in these areas. Patient presents with significant pain, joint stiffness, posture, ROM, motor control, muscle performance (strength/power/endurance), and activity tolerance impairments that are limiting ability to complete his usual activities including anything that requires a lot of standing/walking, cooking for church/wine club, cleaning the house, waiting in line, standing longer than 5-20 min, having conversations with others while standing, yard work, Orthoptist, cleaning, sit <> stand, bending and lifting together, staining deck, shopping, walking more than 2 blocks, social participation, changing positions in the bed, golfing without difficulty. Patient will benefit from skilled physical therapy intervention to address current body structure impairments and activity limitations to improve function and work towards goals set in current POC in order to return to prior level of function or maximal functional improvement.      OBJECTIVE IMPAIRMENTS Abnormal gait, decreased activity tolerance, decreased endurance, decreased mobility, difficulty walking, decreased ROM, decreased strength, hypomobility, impaired perceived functional ability, increased muscle spasms, impaired flexibility, improper body mechanics, postural dysfunction, and pain.    ACTIVITY LIMITATIONS carrying, lifting, bending, standing, bed mobility, dressing, and locomotion level   PARTICIPATION LIMITATIONS: meal prep, cleaning, laundry, interpersonal relationship, shopping, community activity, yard work, church, and   asthma, Zenker's (hypopharyngeal) diverticulum, afib, GERD, hx of fatty liver, hypothyroidism, BPH, OA, hx adeomatous polyp of colon, inguinal hernia, L knee pain, hx of dysplastic nevus, colon surgery (7 inches removed of colon),  cataract extraction, L knee pain   PERSONAL FACTORS Age, Past/current experiences, Time since onset of injury/illness/exacerbation, and 3+ comorbidities:   asthma, Zenker's (hypopharyngeal) diverticulum, afib, GERD, hx of fatty liver, hypothyroidism, BPH, OA, hx adeomatous polyp of colon, inguinal hernia, L knee pain, hx of dysplastic nevus, colon surgery (7 inches removed of colon), cataract extraction, L knee pain are also affecting patient's functional outcome.    REHAB POTENTIAL: Good   CLINICAL DECISION MAKING: Stable/uncomplicated   EVALUATION COMPLEXITY: Low     GOALS: Goals reviewed with patient? No   SHORT TERM GOALS: Target date: 01/03/2022   Patient will be independent with initial home exercise program for self-management of symptoms. Baseline: Initial HEP to be provided at visit 2 as appropriate (12/20/21); initial HEP provided visit 2 (12/26/2021);  Goal status: met   LONG TERM GOALS: Target date: 03/14/2022   Patient will be independent with a long-term home exercise program for self-management of symptoms.  Baseline: Initial HEP to be provided at visit 2 as appropriate (12/20/21); initial HEP provided visit 2 (12/26/2021); participating in HEP (01/23/2022);  Goal status: In-progress   2.  Patient will demonstrate improved FOTO to equal or greater than 67 by visit #11 to demonstrate improvement in overall condition and self-reported functional ability.  Baseline: 54 (  12/20/21); 62 at visit # 10 (01/23/2022);  Goal status: In-progress   3.  Patient will ambulate equal or greater than 1200 feet on 6 Minute Walk Test with no increase in symptoms to demonstrate improved community ambulation.  Baseline: to be tested visit 2 as appropriate (12/20/21); 1,225' up to 2/10 NPS (12/26/2021); 1263 feet with no AD, no complaint of pain while walking. Pain at waist when going to sit after walking. R knee pain (01/23/2022);  Goal status: Met 01/23/2022   4.  Patient will demonstrate the  ability to lift 25# box floor to waist 1x10 times in a row with no increase in symptoms to improve his ability to bend and lift things around his home.  Baseline: reports difficulty with bending and lifting items at home  (12/20/21); 3 reps at 25#. Reported pain with each lift at low back increasing to 4/10 at 25# with request to stop (01/23/2022);  Goal status: In-progress   5.  Patient will report the ability to stand/walk equal or greater than 60 min before needing to sit to improve his ability to stand in line, socialize, and cook.  Baseline: 5-20 min (12/20/21); on his feet 60 min with salon pass on his back making hashbrown casserole this morning before he started to get pain, he feels he would not be able to do this without the salon pass (01/23/2022):  Goal status: Nearly met.      PLAN: PT FREQUENCY: 1-2x/week   PT DURATION: 12 weeks   PLANNED INTERVENTIONS: Therapeutic exercises, Therapeutic activity, Neuromuscular re-education, Patient/Family education, Joint mobilization, DME instructions, Dry Needling, Electrical stimulation, Spinal mobilization, Cryotherapy, Moist heat, Manual therapy, and Re-evaluation.   PLAN FOR NEXT SESSION: update HEP as appropriate, manual therapy and exercises for improved joint mobility in the spine.   Nancy Nordmann, PT, DPT 02/08/22, 8:34 PM  [facility] [address] [place], [place] [address] P: [phone] I F: [phone]

## 2022-02-09 ENCOUNTER — Other Ambulatory Visit: Payer: Self-pay | Admitting: Family Medicine

## 2022-02-14 ENCOUNTER — Ambulatory Visit (INDEPENDENT_AMBULATORY_CARE_PROVIDER_SITE_OTHER): Payer: Medicare Other

## 2022-02-14 DIAGNOSIS — Z23 Encounter for immunization: Secondary | ICD-10-CM

## 2022-02-15 ENCOUNTER — Ambulatory Visit: Payer: Medicare Other | Admitting: Physical Therapy

## 2022-02-17 DIAGNOSIS — M47816 Spondylosis without myelopathy or radiculopathy, lumbar region: Secondary | ICD-10-CM | POA: Diagnosis not present

## 2022-02-17 DIAGNOSIS — Z79899 Other long term (current) drug therapy: Secondary | ICD-10-CM | POA: Diagnosis not present

## 2022-02-21 DIAGNOSIS — H43813 Vitreous degeneration, bilateral: Secondary | ICD-10-CM | POA: Diagnosis not present

## 2022-02-27 ENCOUNTER — Ambulatory Visit: Payer: Medicare Other | Admitting: Family Medicine

## 2022-02-27 ENCOUNTER — Ambulatory Visit: Payer: Medicare Other | Attending: Family Medicine

## 2022-02-27 DIAGNOSIS — M5459 Other low back pain: Secondary | ICD-10-CM | POA: Insufficient documentation

## 2022-02-27 DIAGNOSIS — R293 Abnormal posture: Secondary | ICD-10-CM | POA: Insufficient documentation

## 2022-02-27 DIAGNOSIS — R262 Difficulty in walking, not elsewhere classified: Secondary | ICD-10-CM | POA: Diagnosis not present

## 2022-02-27 DIAGNOSIS — G8929 Other chronic pain: Secondary | ICD-10-CM | POA: Diagnosis not present

## 2022-02-27 DIAGNOSIS — M546 Pain in thoracic spine: Secondary | ICD-10-CM | POA: Diagnosis not present

## 2022-02-27 DIAGNOSIS — M545 Low back pain, unspecified: Secondary | ICD-10-CM | POA: Diagnosis not present

## 2022-02-27 NOTE — Therapy (Signed)
OUTPATIENT PHYSICAL THERAPY TREATMENT NOTE  Patient Name: Daniel [name] MRN: [id number] DOB:[birth year], 71 y.o., male Today's Date: 02/27/2022  PCP: Jerrol Banana., MD REFERRING PROVIDER: Allene Dillon, NP  END OF SESSION:   PT End of Session - 02/27/22 1646     Visit Number 13    Number of Visits 24    Date for PT Re-Evaluation 03/14/22    Authorization Type [employer] MEDICARE reporting period from 01/23/2022    Progress Note Due on Visit 20    PT Start Time 1645    PT Stop Time 1725    PT Time Calculation (min) 40 min    Activity Tolerance Patient tolerated treatment well    Behavior During Therapy [facility] for tasks assessed/performed                Past Medical History:  Diagnosis Date   Actinic keratosis    Asthma    Eczema    Hx of dysplastic nevus 06/06/2006   Mid lower back, slight atypia   Hypothyroidism    Seasonal allergies    Thyroid disease    Past Surgical History:  Procedure Laterality Date   CATARACT EXTRACTION     COLON SURGERY     COLONOSCOPY WITH PROPOFOL N/A 11/18/2014   Procedure: COLONOSCOPY WITH PROPOFOL;  Surgeon: [name], MD;  Location: [place];  Service: Endoscopy;  Laterality: N/A;   COLONOSCOPY WITH PROPOFOL N/A 12/13/2021   Procedure: COLONOSCOPY WITH PROPOFOL;  Surgeon: [name], MD;  Location: [facility] ENDOSCOPY;  Service: Endoscopy;  Laterality: N/A;   EYE SURGERY     KNEE SURGERY     NASAL SEPTUM SURGERY     Patient Active Problem List   Diagnosis Date Noted   Zenker's (hypopharyngeal) diverticulum 03/22/2018   Gastroesophageal reflux disease 03/22/2018   Fatty liver 10/09/2017   BPH (benign prostatic hyperplasia) 11/21/2014   Hypothyroidism 11/21/2014   Obesity 11/21/2014   Asthma 11/21/2014   Inguinal hernia 11/21/2014   OA (osteoarthritis) 11/21/2014   H/O adenomatous polyp of colon 10/14/2014    REFERRING DIAG: lumbar DDD (degenerative disc disease), lumbar stenosis with neurogenic  claudication, lumbar radiculitis  THERAPY DIAG:  Other low back pain  Pain in thoracic spine  Abnormal posture  Difficulty in walking, not elsewhere classified  Chronic bilateral low back pain, unspecified whether sciatica present  Rationale for Evaluation and Treatment: Rehabilitation  PERTINENT HISTORY: Patient is a 71 y.o. male who presents to outpatient physical therapy with a referral for medical diagnosis lumbar DDD (degenerative disc disease, lumbar stenosis with neurogenic claudication, lumbar radiculitis. This patient's chief complaints consist of chronic pain in the low back and upper glutes that moves up towards his B scapulae with prolonged standing/walking and with intermittent shooting down his R left to the knee, leading to the following functional deficits: difficulty with anything that requires a lot of standing/walking, cooking for church/wine club, cleaning the house, waiting in line, standing longer than 5-20 min, having conversations with others while standing, yard work, Orthoptist, cleaning, sit <> stand, bending and lifting together, staining deck, shopping, walking more than 2 blocks, social participation, changing positions in the bed, golfing.    Relevant past medical history and comorbidities include asthma, Zenker's (hypopharyngeal) diverticulum, afib, GERD, hx of fatty liver, hypothyroidism, BPH, OA, hx adeomatous polyp of colon, inguinal hernia, L knee pain, hx of dysplastic nevus, colon surgery (7 inches removed of colon), cataract extraction, L knee pain.  Patient denies hx of  cancer, stroke, seizures, lung problem, diabetes, unexplained weight loss, unexplained changes in bowel or bladder problems, unexplained stumbling or dropping things, spinal surgery. He has been exercising daily.     PRECAUTIONS: none  SUBJECTIVE: Patient reports he did a lot of walking on his trip and his back is a little stiff. He missed his last PT appointment prior to the  trip due to a stomach bug. Gabapentin and tramadol helps control the pain until the end of the day. He is also feeling like this is affecting his neck where it feels like he wants to fall forwards. He took prednisone pill a day to help with his back.   PAIN:  Are you having pain? No just stiff.   OBJECTIVE    TODAY'S TREATMENT  Therapeutic exercise: to centralize symptoms and improve ROM, strength, muscular endurance, and activity tolerance required for successful completion of functional activities.  - standing wall sag, 3x10  - prone press up, 3x10 - prone baby cobra with cervical retraction and hand hover, 3x10 with 5 second holds. Good carryover with scap and cervical retractions.  - Standing Y's on wall: 3x10 - Seated thoracic extension over 1/2 bolster: 3x12. Min to mod VC's to promote thoracic extension.  -Seated thoracic extension + rotation: 3x12. - Lat pull downs: 3x8, 35 #  - Standing scap retractions: 3x8, 15#.  Standing low row pull down with elbows extended: 3x8, min to mod multimodal cuing for form/technique.    Manual therapy: to reduce pain and tissue tension, improve range of motion, neuromodulation, in order to promote improved ability to complete functional activities. PRONE with face in table face cradle - CPA grade II-IV thoracic spine T1 through L5 10 reps each segment.then alternating UPA 1x10 reps per segment.    Pt required multimodal cuing for proper technique and to facilitate improved neuromuscular control, strength, range of motion, and functional ability resulting in improved performance and form.   PATIENT EDUCATION:  Education details: Exercise purpose/form. Self management techniques.  Reviewed cancelation/no-show policy with patient and confirmed patient has correct phone number for clinic; patient verbalized understanding (01/18/22). Person educated: Patient Education method: Explanation, demonstration, VC, TC Education comprehension: verbalized  understanding, demonstrated understanding, and needs further education     HOME EXERCISE PROGRAM: Access Code: [email or web address] URL: [email or web address] Date: 02/08/2022 Prepared by: Rosita Kea  Exercises - Sidelying Thoracic Rotation with Open Book  - 1-2 x daily - 3 sets - 10 reps - 5 seconds hold - Bridge  - 1-2 x daily - 3 sets - 10 reps - 5 seconds hold - Supine Lower Trunk Rotation  - 1 x daily - 3 sets - 15 reps - Prone Press Up  - 1-2 x daily - 3 sets - 10 reps - Bilateral Bent Leg Lift  - 1-2 x daily - 3 sets - 10 reps - Seated Thoracic Lumbar Extension  - Seated Flexion Stretch with Swiss Ball  - 1 x daily - 1 sets - 20 reps - 5 seconds hold - Baby Cobra Hands Hovering   - 1 x daily - 3 sets - 10 reps - 5 seconds hold   ASSESSMENT:   CLINICAL IMPRESSION: Pt arriving to PT after a few weeks absence. Pt tolerating postural strengthening and mobility exercises well without complaint. Does endorse increased soreness and stiffness most likely from time off of PT. Pt educated to listen to bodily response returning to therapy this date. Pt remains with postural limitations requiring min to mod multimodal cuing to improve  targeted musculature. Pt will continue to benefit from skilled PT services to address remaining impairments to progress towards remaining goals.      From PT eval 12/20/2021:  Patient is a 71 y.o. male referred to outpatient physical therapy with a medical diagnosis of lumbar DDD (degenerative disc disease, lumbar stenosis with neurogenic claudication, lumbar radiculitis who presents with signs and symptoms consistent with chronic low back and thoracic spine pain and stiffness with chronic kyphotic postural changes, hip extension weakness, and intermittent radicular symptoms contributing. Patient's pattern of symptoms suggests sustained spinal extension intolerance and difficulty with postural endurance is likely contributing to condition. Previous episode of  care was able to improve patient's quality of life some with decreased pain and improved function and patient has potential for improvement from his current state in these areas. Patient presents with significant pain, joint stiffness, posture, ROM, motor control, muscle performance (strength/power/endurance), and activity tolerance impairments that are limiting ability to complete his usual activities including anything that requires a lot of standing/walking, cooking for church/wine club, cleaning the house, waiting in line, standing longer than 5-20 min, having conversations with others while standing, yard work, Orthoptist, cleaning, sit <> stand, bending and lifting together, staining deck, shopping, walking more than 2 blocks, social participation, changing positions in the bed, golfing without difficulty. Patient will benefit from skilled physical therapy intervention to address current body structure impairments and activity limitations to improve function and work towards goals set in current POC in order to return to prior level of function or maximal functional improvement.      OBJECTIVE IMPAIRMENTS Abnormal gait, decreased activity tolerance, decreased endurance, decreased mobility, difficulty walking, decreased ROM, decreased strength, hypomobility, impaired perceived functional ability, increased muscle spasms, impaired flexibility, improper body mechanics, postural dysfunction, and pain.    ACTIVITY LIMITATIONS carrying, lifting, bending, standing, bed mobility, dressing, and locomotion level   PARTICIPATION LIMITATIONS: meal prep, cleaning, laundry, interpersonal relationship, shopping, community activity, yard work, church, and   asthma, Zenker's (hypopharyngeal) diverticulum, afib, GERD, hx of fatty liver, hypothyroidism, BPH, OA, hx adeomatous polyp of colon, inguinal hernia, L knee pain, hx of dysplastic nevus, colon surgery (7 inches removed of colon), cataract extraction, L  knee pain   PERSONAL FACTORS Age, Past/current experiences, Time since onset of injury/illness/exacerbation, and 3+ comorbidities:   asthma, Zenker's (hypopharyngeal) diverticulum, afib, GERD, hx of fatty liver, hypothyroidism, BPH, OA, hx adeomatous polyp of colon, inguinal hernia, L knee pain, hx of dysplastic nevus, colon surgery (7 inches removed of colon), cataract extraction, L knee pain are also affecting patient's functional outcome.    REHAB POTENTIAL: Good   CLINICAL DECISION MAKING: Stable/uncomplicated   EVALUATION COMPLEXITY: Low     GOALS: Goals reviewed with patient? No   SHORT TERM GOALS: Target date: 01/03/2022   Patient will be independent with initial home exercise program for self-management of symptoms. Baseline: Initial HEP to be provided at visit 2 as appropriate (12/20/21); initial HEP provided visit 2 (12/26/2021);  Goal status: met    LONG TERM GOALS: Target date: 03/14/2022   Patient will be independent with a long-term home exercise program for self-management of symptoms.  Baseline: Initial HEP to be provided at visit 2 as appropriate (12/20/21); initial HEP provided visit 2 (12/26/2021); participating in HEP (01/23/2022);  Goal status: In-progress   2.  Patient will demonstrate improved FOTO to equal or greater than 67 by visit #11 to demonstrate improvement in overall condition and self-reported functional ability.  Baseline: 54 (12/20/21); 62  at visit # 10 (01/23/2022);  Goal status: In-progress   3.  Patient will ambulate equal or greater than 1200 feet on 6 Minute Walk Test with no increase in symptoms to demonstrate improved community ambulation.  Baseline: to be tested visit 2 as appropriate (12/20/21); 1,225' up to 2/10 NPS (12/26/2021); 1263 feet with no AD, no complaint of pain while walking. Pain at waist when going to sit after walking. R knee pain (01/23/2022);  Goal status: Met 01/23/2022   4.  Patient will demonstrate the ability to lift 25# box  floor to waist 1x10 times in a row with no increase in symptoms to improve his ability to bend and lift things around his home.  Baseline: reports difficulty with bending and lifting items at home  (12/20/21); 3 reps at 25#. Reported pain with each lift at low back increasing to 4/10 at 25# with request to stop (01/23/2022);  Goal status: In-progress   5.  Patient will report the ability to stand/walk equal or greater than 60 min before needing to sit to improve his ability to stand in line, socialize, and cook.  Baseline: 5-20 min (12/20/21); on his feet 60 min with salon pass on his back making hashbrown casserole this morning before he started to get pain, he feels he would not be able to do this without the salon pass (01/23/2022):  Goal status: Nearly met.      PLAN: PT FREQUENCY: 1-2x/week   PT DURATION: 12 weeks   PLANNED INTERVENTIONS: Therapeutic exercises, Therapeutic activity, Neuromuscular re-education, Patient/Family education, Joint mobilization, DME instructions, Dry Needling, Electrical stimulation, Spinal mobilization, Cryotherapy, Moist heat, Manual therapy, and Re-evaluation.   PLAN FOR NEXT SESSION: update HEP as appropriate, manual therapy and exercises for improved joint mobility in the spine.  Salem Caster. Fairly IV, PT, DPT Physical Therapist- [facility]  02/27/22, 5:47 PM

## 2022-03-01 ENCOUNTER — Encounter: Payer: Self-pay | Admitting: Physical Therapy

## 2022-03-01 ENCOUNTER — Ambulatory Visit: Payer: Medicare Other

## 2022-03-01 DIAGNOSIS — R262 Difficulty in walking, not elsewhere classified: Secondary | ICD-10-CM

## 2022-03-01 DIAGNOSIS — G8929 Other chronic pain: Secondary | ICD-10-CM

## 2022-03-01 DIAGNOSIS — R293 Abnormal posture: Secondary | ICD-10-CM

## 2022-03-01 DIAGNOSIS — M5459 Other low back pain: Secondary | ICD-10-CM

## 2022-03-01 DIAGNOSIS — M546 Pain in thoracic spine: Secondary | ICD-10-CM | POA: Diagnosis not present

## 2022-03-01 DIAGNOSIS — M545 Low back pain, unspecified: Secondary | ICD-10-CM | POA: Diagnosis not present

## 2022-03-01 NOTE — Therapy (Signed)
OUTPATIENT PHYSICAL THERAPY TREATMENT NOTE  Patient Name: Daniel [name] MRN: [id number] DOB:08-Sep-1950, 71 y.o., male Today's Date: 03/01/2022  PCP: Jerrol Banana., MD REFERRING PROVIDER: Allene Dillon, NP  END OF SESSION:   PT End of Session - 03/01/22 1621     Visit Number 14    Number of Visits 24    Date for PT Re-Evaluation 03/14/22    Authorization Type [employer] MEDICARE reporting period from 01/23/2022    Progress Note Due on Visit 20    PT Start Time 1605    PT Stop Time 1648    PT Time Calculation (min) 43 min    Activity Tolerance Patient tolerated treatment well    Behavior During Therapy [facility] for tasks assessed/performed                Past Medical History:  Diagnosis Date   Actinic keratosis    Asthma    Eczema    Hx of dysplastic nevus 06/06/2006   Mid lower back, slight atypia   Hypothyroidism    Seasonal allergies    Thyroid disease    Past Surgical History:  Procedure Laterality Date   CATARACT EXTRACTION     COLON SURGERY     COLONOSCOPY WITH PROPOFOL N/A 11/18/2014   Procedure: COLONOSCOPY WITH PROPOFOL;  Surgeon: [name], MD;  Location: [place];  Service: Endoscopy;  Laterality: N/A;   COLONOSCOPY WITH PROPOFOL N/A 12/13/2021   Procedure: COLONOSCOPY WITH PROPOFOL;  Surgeon: [name], MD;  Location: [facility] ENDOSCOPY;  Service: Endoscopy;  Laterality: N/A;   EYE SURGERY     KNEE SURGERY     NASAL SEPTUM SURGERY     Patient Active Problem List   Diagnosis Date Noted   Zenker's (hypopharyngeal) diverticulum 03/22/2018   Gastroesophageal reflux disease 03/22/2018   Fatty liver 10/09/2017   BPH (benign prostatic hyperplasia) 11/21/2014   Hypothyroidism 11/21/2014   Obesity 11/21/2014   Asthma 11/21/2014   Inguinal hernia 11/21/2014   OA (osteoarthritis) 11/21/2014   H/O adenomatous polyp of colon 10/14/2014    REFERRING DIAG: lumbar DDD (degenerative disc disease), lumbar stenosis with neurogenic  claudication, lumbar radiculitis  THERAPY DIAG:  Other low back pain  Pain in thoracic spine  Abnormal posture  Difficulty in walking, not elsewhere classified  Chronic bilateral low back pain, unspecified whether sciatica present  Rationale for Evaluation and Treatment: Rehabilitation  PERTINENT HISTORY: Patient is a 71 y.o. male who presents to outpatient physical therapy with a referral for medical diagnosis lumbar DDD (degenerative disc disease, lumbar stenosis with neurogenic claudication, lumbar radiculitis. This patient's chief complaints consist of chronic pain in the low back and upper glutes that moves up towards his B scapulae with prolonged standing/walking and with intermittent shooting down his R left to the knee, leading to the following functional deficits: difficulty with anything that requires a lot of standing/walking, cooking for church/wine club, cleaning the house, waiting in line, standing longer than 5-20 min, having conversations with others while standing, yard work, Orthoptist, cleaning, sit <> stand, bending and lifting together, staining deck, shopping, walking more than 2 blocks, social participation, changing positions in the bed, golfing.    Relevant past medical history and comorbidities include asthma, Zenker's (hypopharyngeal) diverticulum, afib, GERD, hx of fatty liver, hypothyroidism, BPH, OA, hx adeomatous polyp of colon, inguinal hernia, L knee pain, hx of dysplastic nevus, colon surgery (7 inches removed of colon), cataract extraction, L knee pain.  Patient denies hx of  cancer, stroke, seizures, lung problem, diabetes, unexplained weight loss, unexplained changes in bowel or bladder problems, unexplained stumbling or dropping things, spinal surgery. He has been exercising daily.     PRECAUTIONS: none  SUBJECTIVE: Patient reports he did a lot of sitting today so he is stiff and painful in low back across the waistline on R and L side. Felt good  the next day after last session.   PAIN:  Are you having pain? Yes across low back.   OBJECTIVE    TODAY'S TREATMENT  Therapeutic exercise: to centralize symptoms and improve ROM, strength, muscular endurance, and activity tolerance required for successful completion of functional activities.   - prone press up, 3x10  - Seated physioball roll outs: R/L lateral deviations. Forwards. X20/direction. VC's for slowing speed of motion.    - Standing Y's on wall: 2x10  - Seated thoracic extension over 1/2 bolster: 3x12. Min to mod VC's to promote thoracic extension. Arms on occiput of head for pec stretch.   - Lat pull downs: 3x8, 35 #   - Standing scap retractions: 3x8, 15#.   - STS with 2 KG med ball overhead raises. 2x8. Fatigues quickly with diminished upright posture limiting overhead ball raises.    - Pec door way stretch: 2x30 sec with oscillations to pt tolerance.      Manual therapy: to reduce pain and tissue tension, improve range of motion, neuromodulation, in order to promote improved ability to complete functional activities.  PRONE with face in table face cradle - CPA grade III-IV thoracic spine T1 through S1. X2 lengths of spine, 15 sec increments per bout to improve mobility. 10 minutes total.    Pt required multimodal cuing for proper technique and to facilitate improved neuromuscular control, strength, range of motion, and functional ability resulting in improved performance and form.   PATIENT EDUCATION:  Education details: Exercise purpose/form. Self management techniques.  Reviewed cancelation/no-show policy with patient and confirmed patient has correct phone number for clinic; patient verbalized understanding (01/18/22). Person educated: Patient Education method: Explanation, demonstration, VC, TC Education comprehension: verbalized understanding, demonstrated understanding, and needs further education     HOME EXERCISE PROGRAM: Access Code:  [email or web address] URL: [email or web address] Date: 02/08/2022 Prepared by: Rosita Kea  Exercises - Sidelying Thoracic Rotation with Open Book  - 1-2 x daily - 3 sets - 10 reps - 5 seconds hold - Bridge  - 1-2 x daily - 3 sets - 10 reps - 5 seconds hold - Supine Lower Trunk Rotation  - 1 x daily - 3 sets - 15 reps - Prone Press Up  - 1-2 x daily - 3 sets - 10 reps - Bilateral Bent Leg Lift  - 1-2 x daily - 3 sets - 10 reps - Seated Thoracic Lumbar Extension  - Seated Flexion Stretch with Swiss Ball  - 1 x daily - 1 sets - 20 reps - 5 seconds hold - Baby Cobra Hands Hovering   - 1 x daily - 3 sets - 10 reps - 5 seconds hold   ASSESSMENT:   CLINICAL IMPRESSION: Continuing PT POC with focus on thoracolumbar mobility, posture, and periscapular strengthening. Pt endorsing pain relief and reduced stiffness this date with manual intervention and mobility/strengthening exercises. Pt requiring min to mod VC's for form/technique primarily with decreasing speed of exercise for targeted musculature and due to postural limitations. Pt will continue to benefit from skilled PT services to address remaining impairments to progress towards remaining goals.      From  PT eval 12/20/2021:  Patient is a 71 y.o. male referred to outpatient physical therapy with a medical diagnosis of lumbar DDD (degenerative disc disease, lumbar stenosis with neurogenic claudication, lumbar radiculitis who presents with signs and symptoms consistent with chronic low back and thoracic spine pain and stiffness with chronic kyphotic postural changes, hip extension weakness, and intermittent radicular symptoms contributing. Patient's pattern of symptoms suggests sustained spinal extension intolerance and difficulty with postural endurance is likely contributing to condition. Previous episode of care was able to improve patient's quality of life some with decreased pain and improved function and patient has potential for improvement  from his current state in these areas. Patient presents with significant pain, joint stiffness, posture, ROM, motor control, muscle performance (strength/power/endurance), and activity tolerance impairments that are limiting ability to complete his usual activities including anything that requires a lot of standing/walking, cooking for church/wine club, cleaning the house, waiting in line, standing longer than 5-20 min, having conversations with others while standing, yard work, Orthoptist, cleaning, sit <> stand, bending and lifting together, staining deck, shopping, walking more than 2 blocks, social participation, changing positions in the bed, golfing without difficulty. Patient will benefit from skilled physical therapy intervention to address current body structure impairments and activity limitations to improve function and work towards goals set in current POC in order to return to prior level of function or maximal functional improvement.      OBJECTIVE IMPAIRMENTS Abnormal gait, decreased activity tolerance, decreased endurance, decreased mobility, difficulty walking, decreased ROM, decreased strength, hypomobility, impaired perceived functional ability, increased muscle spasms, impaired flexibility, improper body mechanics, postural dysfunction, and pain.    ACTIVITY LIMITATIONS carrying, lifting, bending, standing, bed mobility, dressing, and locomotion level   PARTICIPATION LIMITATIONS: meal prep, cleaning, laundry, interpersonal relationship, shopping, community activity, yard work, church, and   asthma, Zenker's (hypopharyngeal) diverticulum, afib, GERD, hx of fatty liver, hypothyroidism, BPH, OA, hx adeomatous polyp of colon, inguinal hernia, L knee pain, hx of dysplastic nevus, colon surgery (7 inches removed of colon), cataract extraction, L knee pain   PERSONAL FACTORS Age, Past/current experiences, Time since onset of injury/illness/exacerbation, and 3+ comorbidities:    asthma, Zenker's (hypopharyngeal) diverticulum, afib, GERD, hx of fatty liver, hypothyroidism, BPH, OA, hx adeomatous polyp of colon, inguinal hernia, L knee pain, hx of dysplastic nevus, colon surgery (7 inches removed of colon), cataract extraction, L knee pain are also affecting patient's functional outcome.    REHAB POTENTIAL: Good   CLINICAL DECISION MAKING: Stable/uncomplicated   EVALUATION COMPLEXITY: Low     GOALS: Goals reviewed with patient? No   SHORT TERM GOALS: Target date: 01/03/2022   Patient will be independent with initial home exercise program for self-management of symptoms. Baseline: Initial HEP to be provided at visit 2 as appropriate (12/20/21); initial HEP provided visit 2 (12/26/2021);  Goal status: met    LONG TERM GOALS: Target date: 03/14/2022   Patient will be independent with a long-term home exercise program for self-management of symptoms.  Baseline: Initial HEP to be provided at visit 2 as appropriate (12/20/21); initial HEP provided visit 2 (12/26/2021); participating in HEP (01/23/2022);  Goal status: In-progress   2.  Patient will demonstrate improved FOTO to equal or greater than 67 by visit #11 to demonstrate improvement in overall condition and self-reported functional ability.  Baseline: 54 (12/20/21); 62 at visit # 10 (01/23/2022);  Goal status: In-progress   3.  Patient will ambulate equal or greater than 1200 feet on 6 Minute Walk  Test with no increase in symptoms to demonstrate improved community ambulation.  Baseline: to be tested visit 2 as appropriate (12/20/21); 1,225' up to 2/10 NPS (12/26/2021); 1263 feet with no AD, no complaint of pain while walking. Pain at waist when going to sit after walking. R knee pain (01/23/2022);  Goal status: Met 01/23/2022   4.  Patient will demonstrate the ability to lift 25# box floor to waist 1x10 times in a row with no increase in symptoms to improve his ability to bend and lift things around his home.   Baseline: reports difficulty with bending and lifting items at home  (12/20/21); 3 reps at 25#. Reported pain with each lift at low back increasing to 4/10 at 25# with request to stop (01/23/2022);  Goal status: In-progress   5.  Patient will report the ability to stand/walk equal or greater than 60 min before needing to sit to improve his ability to stand in line, socialize, and cook.  Baseline: 5-20 min (12/20/21); on his feet 60 min with salon pass on his back making hashbrown casserole this morning before he started to get pain, he feels he would not be able to do this without the salon pass (01/23/2022):  Goal status: Nearly met.      PLAN: PT FREQUENCY: 1-2x/week   PT DURATION: 12 weeks   PLANNED INTERVENTIONS: Therapeutic exercises, Therapeutic activity, Neuromuscular re-education, Patient/Family education, Joint mobilization, DME instructions, Dry Needling, Electrical stimulation, Spinal mobilization, Cryotherapy, Moist heat, Manual therapy, and Re-evaluation.   PLAN FOR NEXT SESSION: update HEP as appropriate, manual therapy and exercises for improved joint mobility in the spine.  Salem Caster. Fairly IV, PT, DPT Physical Therapist- [facility]  03/01/22, 5:03 PM

## 2022-03-02 NOTE — Telephone Encounter (Signed)
Ok to switch PCP.  Please update in Epic and make sure he has next appt scheduled.

## 2022-03-06 ENCOUNTER — Ambulatory Visit: Payer: Medicare Other | Admitting: Physical Therapy

## 2022-03-06 ENCOUNTER — Encounter: Payer: Self-pay | Admitting: Physical Therapy

## 2022-03-06 DIAGNOSIS — R262 Difficulty in walking, not elsewhere classified: Secondary | ICD-10-CM

## 2022-03-06 DIAGNOSIS — R293 Abnormal posture: Secondary | ICD-10-CM

## 2022-03-06 DIAGNOSIS — G8929 Other chronic pain: Secondary | ICD-10-CM | POA: Diagnosis not present

## 2022-03-06 DIAGNOSIS — M5459 Other low back pain: Secondary | ICD-10-CM | POA: Diagnosis not present

## 2022-03-06 DIAGNOSIS — M545 Low back pain, unspecified: Secondary | ICD-10-CM | POA: Diagnosis not present

## 2022-03-06 DIAGNOSIS — M546 Pain in thoracic spine: Secondary | ICD-10-CM | POA: Diagnosis not present

## 2022-03-06 NOTE — Therapy (Signed)
OUTPATIENT PHYSICAL THERAPY TREATMENT NOTE  Patient Name: Daniel [name] MRN: [id number] DOB:02-Nov-1950, 71 y.o., male Today's Date: 03/06/2022  PCP: Jerrol Banana., MD REFERRING PROVIDER: Allene Dillon, NP  END OF SESSION:   PT End of Session - 03/06/22 1608     Visit Number 15    Number of Visits 24    Date for PT Re-Evaluation 03/14/22    Authorization Type [employer] MEDICARE reporting period from 01/23/2022    Progress Note Due on Visit 20    PT Start Time 1605    PT Stop Time 1643    PT Time Calculation (min) 38 min    Activity Tolerance Patient tolerated treatment well    Behavior During Therapy [facility] for tasks assessed/performed             Past Medical History:  Diagnosis Date   Actinic keratosis    Asthma    Eczema    Hx of dysplastic nevus 06/06/2006   Mid lower back, slight atypia   Hypothyroidism    Seasonal allergies    Thyroid disease    Past Surgical History:  Procedure Laterality Date   CATARACT EXTRACTION     COLON SURGERY     COLONOSCOPY WITH PROPOFOL N/A 11/18/2014   Procedure: COLONOSCOPY WITH PROPOFOL;  Surgeon: [name], MD;  Location: [place];  Service: Endoscopy;  Laterality: N/A;   COLONOSCOPY WITH PROPOFOL N/A 12/13/2021   Procedure: COLONOSCOPY WITH PROPOFOL;  Surgeon: [name], MD;  Location: [facility];  Service: Endoscopy;  Laterality: N/A;   EYE SURGERY     KNEE SURGERY     NASAL SEPTUM SURGERY     Patient Active Problem List   Diagnosis Date Noted   Zenker's (hypopharyngeal) diverticulum 03/22/2018   Gastroesophageal reflux disease 03/22/2018   Fatty liver 10/09/2017   BPH (benign prostatic hyperplasia) 11/21/2014   Hypothyroidism 11/21/2014   Obesity 11/21/2014   Asthma 11/21/2014   Inguinal hernia 11/21/2014   OA (osteoarthritis) 11/21/2014   H/O adenomatous polyp of colon 10/14/2014    REFERRING DIAG: lumbar DDD (degenerative disc disease), lumbar stenosis with neurogenic claudication,  lumbar radiculitis  THERAPY DIAG:  Other low back pain  Pain in thoracic spine  Abnormal posture  Difficulty in walking, not elsewhere classified  Rationale for Evaluation and Treatment: Rehabilitation  PERTINENT HISTORY: Patient is a 71 y.o. male who presents to outpatient physical therapy with a referral for medical diagnosis lumbar DDD (degenerative disc disease, lumbar stenosis with neurogenic claudication, lumbar radiculitis. This patient's chief complaints consist of chronic pain in the low back and upper glutes that moves up towards his B scapulae with prolonged standing/walking and with intermittent shooting down his R left to the knee, leading to the following functional deficits: difficulty with anything that requires a lot of standing/walking, cooking for church/wine club, cleaning the house, waiting in line, standing longer than 5-20 min, having conversations with others while standing, yard work, Orthoptist, cleaning, sit <> stand, bending and lifting together, staining deck, shopping, walking more than 2 blocks, social participation, changing positions in the bed, golfing.    Relevant past medical history and comorbidities include asthma, Zenker's (hypopharyngeal) diverticulum, afib, GERD, hx of fatty liver, hypothyroidism, BPH, OA, hx adeomatous polyp of colon, inguinal hernia, L knee pain, hx of dysplastic nevus, colon surgery (7 inches removed of colon), cataract extraction, L knee pain.  Patient denies hx of cancer, stroke, seizures, lung problem, diabetes, unexplained weight loss, unexplained changes in bowel  or bladder problems, unexplained stumbling or dropping things, spinal surgery. He has been exercising daily.     PRECAUTIONS: none  SUBJECTIVE: Patient reports he is feeling stiff today in the low back. Patient states he is going to get an injection in his back tomorrow.   PAIN:  Are you having pain? Yes across low back. 1-2/10 mostly stiffness.     OBJECTIVE   TODAY'S TREATMENT  Therapeutic exercise: to centralize symptoms and improve ROM, strength, muscular endurance, and activity tolerance required for successful completion of functional activities. - prone press up, 3x10 - prone scapular retraction with upper trunk extension, 3x10.  - STS with 2 KG med ball overhead raises. 1x8. Fatigues quickly with diminished upright posture limiting overhead ball raises.   - standing cervical retraction/extension press into small green ball on wall while levering body away from wall, 3x10.  - Standing Y's on wall: 1x10 AROM, 2x10 with 2#DB in each hand.  - standing ball toss overhead onto wall, 2x20 with small green ball to improve postural endurance.  - Seated thoracic extension over 1/2 bolster: 3x10. Min TC/VC's to promote thoracic extension. Arms on occiput of head for pec stretch.  - Lat pull downs: 3x10, 35#  - Seated scapular row at [facility] machine: 3x10 at 15/25/35#.  - wall pec stretch (facing wall hands stretched to each side at 90 degrees abduction, rotating hips and torso to one side, 1x30 seconds each side.  - Pec door way stretch: 1x30 sec to pt tolerance.   Manual therapy: to reduce pain and tissue tension, improve range of motion, neuromodulation, in order to promote improved ability to complete functional activities. PRONE with face in table face cradle - CPA grade III-IV thoracic spine T1 through L5. ~ 10 reps per segment to improve mobility.   Pt required multimodal cuing for proper technique and to facilitate improved neuromuscular control, strength, range of motion, and functional ability resulting in improved performance and form.   PATIENT EDUCATION:  Education details: Exercise purpose/form. Self management techniques.  Reviewed cancelation/no-show policy with patient and confirmed patient has correct phone number for clinic; patient verbalized understanding (01/18/22). Person educated: Patient Education method:  Explanation, demonstration, VC, TC Education comprehension: verbalized understanding, demonstrated understanding, and needs further education     HOME EXERCISE PROGRAM: Access Code: [email or web address] URL: [email or web address] Date: 02/08/2022 Prepared by: Rosita Kea  Exercises - Sidelying Thoracic Rotation with Open Book  - 1-2 x daily - 3 sets - 10 reps - 5 seconds hold - Bridge  - 1-2 x daily - 3 sets - 10 reps - 5 seconds hold - Supine Lower Trunk Rotation  - 1 x daily - 3 sets - 15 reps - Prone Press Up  - 1-2 x daily - 3 sets - 10 reps - Bilateral Bent Leg Lift  - 1-2 x daily - 3 sets - 10 reps - Seated Thoracic Lumbar Extension  - Seated Flexion Stretch with Swiss Ball  - 1 x daily - 1 sets - 20 reps - 5 seconds hold - Baby Cobra Hands Hovering   - 1 x daily - 3 sets - 10 reps - 5 seconds hold   ASSESSMENT:   CLINICAL IMPRESSION: Patient tolerated treatment well with abolishment of pain and report of feeling able to stand up straighter by end of session. Patient continues to have difficulty with stiffness and postural strength/endurance to maintain upright posture. Plan to continue with interventions for improved mobility and strength/endurance to improve functional abilitiies. Patient would benefit  from continued management of limiting condition by skilled physical therapist to address remaining impairments and functional limitations to work towards stated goals and return to PLOF or maximal functional independence.     From PT eval 12/20/2021:  Patient is a 71 y.o. male referred to outpatient physical therapy with a medical diagnosis of lumbar DDD (degenerative disc disease, lumbar stenosis with neurogenic claudication, lumbar radiculitis who presents with signs and symptoms consistent with chronic low back and thoracic spine pain and stiffness with chronic kyphotic postural changes, hip extension weakness, and intermittent radicular symptoms contributing. Patient's pattern of  symptoms suggests sustained spinal extension intolerance and difficulty with postural endurance is likely contributing to condition. Previous episode of care was able to improve patient's quality of life some with decreased pain and improved function and patient has potential for improvement from his current state in these areas. Patient presents with significant pain, joint stiffness, posture, ROM, motor control, muscle performance (strength/power/endurance), and activity tolerance impairments that are limiting ability to complete his usual activities including anything that requires a lot of standing/walking, cooking for church/wine club, cleaning the house, waiting in line, standing longer than 5-20 min, having conversations with others while standing, yard work, Orthoptist, cleaning, sit <> stand, bending and lifting together, staining deck, shopping, walking more than 2 blocks, social participation, changing positions in the bed, golfing without difficulty. Patient will benefit from skilled physical therapy intervention to address current body structure impairments and activity limitations to improve function and work towards goals set in current POC in order to return to prior level of function or maximal functional improvement.      OBJECTIVE IMPAIRMENTS Abnormal gait, decreased activity tolerance, decreased endurance, decreased mobility, difficulty walking, decreased ROM, decreased strength, hypomobility, impaired perceived functional ability, increased muscle spasms, impaired flexibility, improper body mechanics, postural dysfunction, and pain.    ACTIVITY LIMITATIONS carrying, lifting, bending, standing, bed mobility, dressing, and locomotion level   PARTICIPATION LIMITATIONS: meal prep, cleaning, laundry, interpersonal relationship, shopping, community activity, yard work, church, and   asthma, Zenker's (hypopharyngeal) diverticulum, afib, GERD, hx of fatty liver, hypothyroidism, BPH,  OA, hx adeomatous polyp of colon, inguinal hernia, L knee pain, hx of dysplastic nevus, colon surgery (7 inches removed of colon), cataract extraction, L knee pain   PERSONAL FACTORS Age, Past/current experiences, Time since onset of injury/illness/exacerbation, and 3+ comorbidities:   asthma, Zenker's (hypopharyngeal) diverticulum, afib, GERD, hx of fatty liver, hypothyroidism, BPH, OA, hx adeomatous polyp of colon, inguinal hernia, L knee pain, hx of dysplastic nevus, colon surgery (7 inches removed of colon), cataract extraction, L knee pain are also affecting patient's functional outcome.    REHAB POTENTIAL: Good   CLINICAL DECISION MAKING: Stable/uncomplicated   EVALUATION COMPLEXITY: Low     GOALS: Goals reviewed with patient? No   SHORT TERM GOALS: Target date: 01/03/2022   Patient will be independent with initial home exercise program for self-management of symptoms. Baseline: Initial HEP to be provided at visit 2 as appropriate (12/20/21); initial HEP provided visit 2 (12/26/2021);  Goal status: met    LONG TERM GOALS: Target date: 03/14/2022   Patient will be independent with a long-term home exercise program for self-management of symptoms.  Baseline: Initial HEP to be provided at visit 2 as appropriate (12/20/21); initial HEP provided visit 2 (12/26/2021); participating in HEP (01/23/2022);  Goal status: In-progress   2.  Patient will demonstrate improved FOTO to equal or greater than 67 by visit #11 to demonstrate improvement in overall condition  and self-reported functional ability.  Baseline: 54 (12/20/21); 62 at visit # 10 (01/23/2022);  Goal status: In-progress   3.  Patient will ambulate equal or greater than 1200 feet on 6 Minute Walk Test with no increase in symptoms to demonstrate improved community ambulation.  Baseline: to be tested visit 2 as appropriate (12/20/21); 1,225' up to 2/10 NPS (12/26/2021); 1263 feet with no AD, no complaint of pain while walking. Pain at  waist when going to sit after walking. R knee pain (01/23/2022);  Goal status: Met 01/23/2022   4.  Patient will demonstrate the ability to lift 25# box floor to waist 1x10 times in a row with no increase in symptoms to improve his ability to bend and lift things around his home.  Baseline: reports difficulty with bending and lifting items at home  (12/20/21); 3 reps at 25#. Reported pain with each lift at low back increasing to 4/10 at 25# with request to stop (01/23/2022);  Goal status: In-progress   5.  Patient will report the ability to stand/walk equal or greater than 60 min before needing to sit to improve his ability to stand in line, socialize, and cook.  Baseline: 5-20 min (12/20/21); on his feet 60 min with salon pass on his back making hashbrown casserole this morning before he started to get pain, he feels he would not be able to do this without the salon pass (01/23/2022):  Goal status: Nearly met.      PLAN: PT FREQUENCY: 1-2x/week   PT DURATION: 12 weeks   PLANNED INTERVENTIONS: Therapeutic exercises, Therapeutic activity, Neuromuscular re-education, Patient/Family education, Joint mobilization, DME instructions, Dry Needling, Electrical stimulation, Spinal mobilization, Cryotherapy, Moist heat, Manual therapy, and Re-evaluation.   PLAN FOR NEXT SESSION: update HEP as appropriate, manual therapy and exercises for improved joint mobility in the spine.  Everlean Alstrom. Graylon Good, PT, DPT 03/06/22, 6:29 PM  [facility] [address] [place], [place] [address] P: [phone] I F: [phone]

## 2022-03-07 DIAGNOSIS — M47816 Spondylosis without myelopathy or radiculopathy, lumbar region: Secondary | ICD-10-CM | POA: Diagnosis not present

## 2022-03-08 ENCOUNTER — Ambulatory Visit: Payer: Medicare Other | Admitting: Physical Therapy

## 2022-03-08 ENCOUNTER — Encounter: Payer: Self-pay | Admitting: Physical Therapy

## 2022-03-08 DIAGNOSIS — G8929 Other chronic pain: Secondary | ICD-10-CM | POA: Diagnosis not present

## 2022-03-08 DIAGNOSIS — R262 Difficulty in walking, not elsewhere classified: Secondary | ICD-10-CM

## 2022-03-08 DIAGNOSIS — M545 Low back pain, unspecified: Secondary | ICD-10-CM | POA: Diagnosis not present

## 2022-03-08 DIAGNOSIS — M5459 Other low back pain: Secondary | ICD-10-CM

## 2022-03-08 DIAGNOSIS — M546 Pain in thoracic spine: Secondary | ICD-10-CM | POA: Diagnosis not present

## 2022-03-08 DIAGNOSIS — R293 Abnormal posture: Secondary | ICD-10-CM | POA: Diagnosis not present

## 2022-03-08 NOTE — Therapy (Signed)
OUTPATIENT PHYSICAL THERAPY TREATMENT NOTE  Patient Name: Daniel [name] MRN: [id number] DOB:Jun 28, 1950, 71 y.o., male Today's Date: 03/08/2022  PCP: Jerrol Banana., MD REFERRING PROVIDER: Allene Dillon, NP  END OF SESSION:   PT End of Session - 03/08/22 1618     Visit Number 16    Number of Visits 24    Date for PT Re-Evaluation 03/14/22    Authorization Type [employer] MEDICARE reporting period from 01/23/2022    Progress Note Due on Visit 20    PT Start Time 1555    PT Stop Time 1635    PT Time Calculation (min) 40 min    Activity Tolerance Patient tolerated treatment well    Behavior During Therapy [facility] for tasks assessed/performed              Past Medical History:  Diagnosis Date   Actinic keratosis    Asthma    Eczema    Hx of dysplastic nevus 06/06/2006   Mid lower back, slight atypia   Hypothyroidism    Seasonal allergies    Thyroid disease    Past Surgical History:  Procedure Laterality Date   CATARACT EXTRACTION     COLON SURGERY     COLONOSCOPY WITH PROPOFOL N/A 11/18/2014   Procedure: COLONOSCOPY WITH PROPOFOL;  Surgeon: [name], MD;  Location: [facility] ENDOSCOPY;  Service: Endoscopy;  Laterality: N/A;   COLONOSCOPY WITH PROPOFOL N/A 12/13/2021   Procedure: COLONOSCOPY WITH PROPOFOL;  Surgeon: [name], MD;  Location: [facility] ENDOSCOPY;  Service: Endoscopy;  Laterality: N/A;   EYE SURGERY     KNEE SURGERY     NASAL SEPTUM SURGERY     Patient Active Problem List   Diagnosis Date Noted   Zenker's (hypopharyngeal) diverticulum 03/22/2018   Gastroesophageal reflux disease 03/22/2018   Fatty liver 10/09/2017   BPH (benign prostatic hyperplasia) 11/21/2014   Hypothyroidism 11/21/2014   Obesity 11/21/2014   Asthma 11/21/2014   Inguinal hernia 11/21/2014   OA (osteoarthritis) 11/21/2014   H/O adenomatous polyp of colon 10/14/2014    REFERRING DIAG: lumbar DDD (degenerative disc disease), lumbar stenosis with neurogenic claudication,  lumbar radiculitis  THERAPY DIAG:  Other low back pain  Pain in thoracic spine  Abnormal posture  Difficulty in walking, not elsewhere classified  Rationale for Evaluation and Treatment: Rehabilitation  PERTINENT HISTORY: Patient is a 71 y.o. male who presents to outpatient physical therapy with a referral for medical diagnosis lumbar DDD (degenerative disc disease, lumbar stenosis with neurogenic claudication, lumbar radiculitis. This patient's chief complaints consist of chronic pain in the low back and upper glutes that moves up towards his B scapulae with prolonged standing/walking and with intermittent shooting down his R left to the knee, leading to the following functional deficits: difficulty with anything that requires a lot of standing/walking, cooking for church/wine club, cleaning the house, waiting in line, standing longer than 5-20 min, having conversations with others while standing, yard work, Orthoptist, cleaning, sit <> stand, bending and lifting together, staining deck, shopping, walking more than 2 blocks, social participation, changing positions in the bed, golfing.    Relevant past medical history and comorbidities include asthma, Zenker's (hypopharyngeal) diverticulum, afib, GERD, hx of fatty liver, hypothyroidism, BPH, OA, hx adeomatous polyp of colon, inguinal hernia, L knee pain, hx of dysplastic nevus, colon surgery (7 inches removed of colon), cataract extraction, L knee pain.  Patient denies hx of cancer, stroke, seizures, lung problem, diabetes, unexplained weight loss, unexplained changes in  bowel or bladder problems, unexplained stumbling or dropping things, spinal surgery. He has been exercising daily.     PRECAUTIONS: none  SUBJECTIVE: Patient reports he had a nerve block injection (MMB) to the L4-5 and L5-S1 facet joints yesterday morning at 8 am. He felt a lot of improvement until about 3-4pm. He had trouble sleeping last night, so he is tired today.  He states they did not tell him not to come to PT today. He states the injections did not help the stiffness. He has his next MMB injection scheduled for 03/22/2022.   PAIN:  Are you having pain? No pain but stiff.     OBJECTIVE   TODAY'S TREATMENT  Therapeutic exercise: to centralize symptoms and improve ROM, strength, muscular endurance, and activity tolerance required for successful completion of functional activities. - Seated scapular row at [place]: 3x10 at 35#. - Lat pull downs: 3x10, 35#  (Manual therapy - see below) - prone press up, 3x10 - prone scapular retraction with upper trunk extension hands at sides of face, 3x10 (difficult) - STS with 2 KG med ball overhead raises. 2x9/8. Fatigues quickly with diminished upright posture limiting overhead ball raises.   - standing cervical retraction/extension press into small green ball on wall while levering body away from wall, 3x10.  - Standing Y's on wall: 3x10 with 2#DB in each hand.  - Pec door way stretch: 3x30 sec to pt tolerance.  - Seated thoracic extension over 1/2 bolster: 2x10. Min TC/VC's to promote thoracic extension. Arms on occiput of head for pec stretch.   Manual therapy: to reduce pain and tissue tension, improve range of motion, neuromodulation, in order to promote improved ability to complete functional activities. PRONE with face in table face cradle - CPA/UPA grade III-IV thoracic spine T1 through L2. ~ 20-40 CPA and 10 reps alternating UPA each side per segment to improve mobility.  Pt required multimodal cuing for proper technique and to facilitate improved neuromuscular control, strength, range of motion, and functional ability resulting in improved performance and form.   PATIENT EDUCATION:  Education details: Exercise purpose/form. Self management techniques.  Reviewed cancelation/no-show policy with patient and confirmed patient has correct phone number for clinic; patient verbalized understanding  (01/18/22). Person educated: Patient Education method: Explanation, demonstration, VC, TC Education comprehension: verbalized understanding, demonstrated understanding, and needs further education     HOME EXERCISE PROGRAM: Access Code: [email or web address] URL: [email or web address] Date: 02/08/2022 Prepared by: Rosita Kea  Exercises - Sidelying Thoracic Rotation with Open Book  - 1-2 x daily - 3 sets - 10 reps - 5 seconds hold - Bridge  - 1-2 x daily - 3 sets - 10 reps - 5 seconds hold - Supine Lower Trunk Rotation  - 1 x daily - 3 sets - 15 reps - Prone Press Up  - 1-2 x daily - 3 sets - 10 reps - Bilateral Bent Leg Lift  - 1-2 x daily - 3 sets - 10 reps - Seated Thoracic Lumbar Extension  - Seated Flexion Stretch with Swiss Ball  - 1 x daily - 1 sets - 20 reps - 5 seconds hold - Baby Cobra Hands Hovering   - 1 x daily - 3 sets - 10 reps - 5 seconds hold   ASSESSMENT:   CLINICAL IMPRESSION: Patient arrives with extra fatigue today after not sleeping well last night. Session continued to focus on improving spinal mobility and postural strength and endurance. Patient required  more rests today due to increased fatigue but  reported improved stiffness and no pain by end of session. Patient continues to have difficulty maintaining upright posture. Patient would benefit from continued management of limiting condition by skilled physical therapist to address remaining impairments and functional limitations to work towards stated goals and return to PLOF or maximal functional independence.     From PT eval 12/20/2021:  Patient is a 71 y.o. male referred to outpatient physical therapy with a medical diagnosis of lumbar DDD (degenerative disc disease, lumbar stenosis with neurogenic claudication, lumbar radiculitis who presents with signs and symptoms consistent with chronic low back and thoracic spine pain and stiffness with chronic kyphotic postural changes, hip extension weakness, and  intermittent radicular symptoms contributing. Patient's pattern of symptoms suggests sustained spinal extension intolerance and difficulty with postural endurance is likely contributing to condition. Previous episode of care was able to improve patient's quality of life some with decreased pain and improved function and patient has potential for improvement from his current state in these areas. Patient presents with significant pain, joint stiffness, posture, ROM, motor control, muscle performance (strength/power/endurance), and activity tolerance impairments that are limiting ability to complete his usual activities including anything that requires a lot of standing/walking, cooking for church/wine club, cleaning the house, waiting in line, standing longer than 5-20 min, having conversations with others while standing, yard work, Orthoptist, cleaning, sit <> stand, bending and lifting together, staining deck, shopping, walking more than 2 blocks, social participation, changing positions in the bed, golfing without difficulty. Patient will benefit from skilled physical therapy intervention to address current body structure impairments and activity limitations to improve function and work towards goals set in current POC in order to return to prior level of function or maximal functional improvement.      OBJECTIVE IMPAIRMENTS Abnormal gait, decreased activity tolerance, decreased endurance, decreased mobility, difficulty walking, decreased ROM, decreased strength, hypomobility, impaired perceived functional ability, increased muscle spasms, impaired flexibility, improper body mechanics, postural dysfunction, and pain.    ACTIVITY LIMITATIONS carrying, lifting, bending, standing, bed mobility, dressing, and locomotion level   PARTICIPATION LIMITATIONS: meal prep, cleaning, laundry, interpersonal relationship, shopping, community activity, yard work, church, and   asthma, Zenker's (hypopharyngeal)  diverticulum, afib, GERD, hx of fatty liver, hypothyroidism, BPH, OA, hx adeomatous polyp of colon, inguinal hernia, L knee pain, hx of dysplastic nevus, colon surgery (7 inches removed of colon), cataract extraction, L knee pain   PERSONAL FACTORS Age, Past/current experiences, Time since onset of injury/illness/exacerbation, and 3+ comorbidities:   asthma, Zenker's (hypopharyngeal) diverticulum, afib, GERD, hx of fatty liver, hypothyroidism, BPH, OA, hx adeomatous polyp of colon, inguinal hernia, L knee pain, hx of dysplastic nevus, colon surgery (7 inches removed of colon), cataract extraction, L knee pain are also affecting patient's functional outcome.    REHAB POTENTIAL: Good   CLINICAL DECISION MAKING: Stable/uncomplicated   EVALUATION COMPLEXITY: Low     GOALS: Goals reviewed with patient? No   SHORT TERM GOALS: Target date: 01/03/2022   Patient will be independent with initial home exercise program for self-management of symptoms. Baseline: Initial HEP to be provided at visit 2 as appropriate (12/20/21); initial HEP provided visit 2 (12/26/2021);  Goal status: met    LONG TERM GOALS: Target date: 03/14/2022   Patient will be independent with a long-term home exercise program for self-management of symptoms.  Baseline: Initial HEP to be provided at visit 2 as appropriate (12/20/21); initial HEP provided visit 2 (12/26/2021); participating in HEP (01/23/2022);  Goal status: In-progress   2.  Patient will demonstrate improved FOTO to equal or greater than 67 by visit #11 to demonstrate improvement in overall condition and self-reported functional ability.  Baseline: 54 (12/20/21); 62 at visit # 10 (01/23/2022);  Goal status: In-progress   3.  Patient will ambulate equal or greater than 1200 feet on 6 Minute Walk Test with no increase in symptoms to demonstrate improved community ambulation.  Baseline: to be tested visit 2 as appropriate (12/20/21); 1,225' up to 2/10 NPS (12/26/2021);  1263 feet with no AD, no complaint of pain while walking. Pain at waist when going to sit after walking. R knee pain (01/23/2022);  Goal status: Met 01/23/2022   4.  Patient will demonstrate the ability to lift 25# box floor to waist 1x10 times in a row with no increase in symptoms to improve his ability to bend and lift things around his home.  Baseline: reports difficulty with bending and lifting items at home  (12/20/21); 3 reps at 25#. Reported pain with each lift at low back increasing to 4/10 at 25# with request to stop (01/23/2022);  Goal status: In-progress   5.  Patient will report the ability to stand/walk equal or greater than 60 min before needing to sit to improve his ability to stand in line, socialize, and cook.  Baseline: 5-20 min (12/20/21); on his feet 60 min with salon pass on his back making hashbrown casserole this morning before he started to get pain, he feels he would not be able to do this without the salon pass (01/23/2022):  Goal status: Nearly met.      PLAN: PT FREQUENCY: 1-2x/week   PT DURATION: 12 weeks   PLANNED INTERVENTIONS: Therapeutic exercises, Therapeutic activity, Neuromuscular re-education, Patient/Family education, Joint mobilization, DME instructions, Dry Needling, Electrical stimulation, Spinal mobilization, Cryotherapy, Moist heat, Manual therapy, and Re-evaluation.   PLAN FOR NEXT SESSION: update HEP as appropriate, manual therapy and exercises for improved joint mobility in the spine.  Everlean Alstrom. Graylon Good, PT, DPT 03/08/22, 4:45 PM  [facility] [address] [place], [place] [address] P: [phone] I F: [phone]

## 2022-03-13 ENCOUNTER — Encounter: Payer: Self-pay | Admitting: Physical Therapy

## 2022-03-13 ENCOUNTER — Ambulatory Visit: Payer: Medicare Other | Admitting: Physical Therapy

## 2022-03-13 DIAGNOSIS — M5459 Other low back pain: Secondary | ICD-10-CM | POA: Diagnosis not present

## 2022-03-13 DIAGNOSIS — R293 Abnormal posture: Secondary | ICD-10-CM | POA: Diagnosis not present

## 2022-03-13 DIAGNOSIS — M546 Pain in thoracic spine: Secondary | ICD-10-CM

## 2022-03-13 DIAGNOSIS — G8929 Other chronic pain: Secondary | ICD-10-CM | POA: Diagnosis not present

## 2022-03-13 DIAGNOSIS — M545 Low back pain, unspecified: Secondary | ICD-10-CM | POA: Diagnosis not present

## 2022-03-13 DIAGNOSIS — R262 Difficulty in walking, not elsewhere classified: Secondary | ICD-10-CM

## 2022-03-13 NOTE — Therapy (Signed)
OUTPATIENT PHYSICAL THERAPY TREATMENT NOTE  Patient Name: Daniel [name] MRN: [id number] DOB:August 18, 1950, 71 y.o., male Today's Date: 03/13/2022  PCP: Jerrol Banana., MD REFERRING PROVIDER: Allene Dillon, NP  END OF SESSION:   PT End of Session - 03/13/22 1606     Visit Number 17    Number of Visits 24    Date for PT Re-Evaluation 03/14/22    Authorization Type [employer] MEDICARE reporting period from 01/23/2022    Progress Note Due on Visit 20    PT Start Time 1605    PT Stop Time 1643    PT Time Calculation (min) 38 min    Activity Tolerance Patient tolerated treatment well    Behavior During Therapy [facility] for tasks assessed/performed               Past Medical History:  Diagnosis Date   Actinic keratosis    Asthma    Eczema    Hx of dysplastic nevus 06/06/2006   Mid lower back, slight atypia   Hypothyroidism    Seasonal allergies    Thyroid disease    Past Surgical History:  Procedure Laterality Date   CATARACT EXTRACTION     COLON SURGERY     COLONOSCOPY WITH PROPOFOL N/A 11/18/2014   Procedure: COLONOSCOPY WITH PROPOFOL;  Surgeon: [name], MD;  Location: [place];  Service: Endoscopy;  Laterality: N/A;   COLONOSCOPY WITH PROPOFOL N/A 12/13/2021   Procedure: COLONOSCOPY WITH PROPOFOL;  Surgeon: [name], MD;  Location: [facility] ENDOSCOPY;  Service: Endoscopy;  Laterality: N/A;   EYE SURGERY     KNEE SURGERY     NASAL SEPTUM SURGERY     Patient Active Problem List   Diagnosis Date Noted   Zenker's (hypopharyngeal) diverticulum 03/22/2018   Gastroesophageal reflux disease 03/22/2018   Fatty liver 10/09/2017   BPH (benign prostatic hyperplasia) 11/21/2014   Hypothyroidism 11/21/2014   Obesity 11/21/2014   Asthma 11/21/2014   Inguinal hernia 11/21/2014   OA (osteoarthritis) 11/21/2014   H/O adenomatous polyp of colon 10/14/2014    REFERRING DIAG: lumbar DDD (degenerative disc disease), lumbar stenosis with neurogenic  claudication, lumbar radiculitis  THERAPY DIAG:  Other low back pain  Pain in thoracic spine  Abnormal posture  Difficulty in walking, not elsewhere classified  Rationale for Evaluation and Treatment: Rehabilitation  PERTINENT HISTORY: Patient is a 71 y.o. male who presents to outpatient physical therapy with a referral for medical diagnosis lumbar DDD (degenerative disc disease, lumbar stenosis with neurogenic claudication, lumbar radiculitis. This patient's chief complaints consist of chronic pain in the low back and upper glutes that moves up towards his B scapulae with prolonged standing/walking and with intermittent shooting down his R left to the knee, leading to the following functional deficits: difficulty with anything that requires a lot of standing/walking, cooking for church/wine club, cleaning the house, waiting in line, standing longer than 5-20 min, having conversations with others while standing, yard work, Orthoptist, cleaning, sit <> stand, bending and lifting together, staining deck, shopping, walking more than 2 blocks, social participation, changing positions in the bed, golfing.    Relevant past medical history and comorbidities include asthma, Zenker's (hypopharyngeal) diverticulum, afib, GERD, hx of fatty liver, hypothyroidism, BPH, OA, hx adeomatous polyp of colon, inguinal hernia, L knee pain, hx of dysplastic nevus, colon surgery (7 inches removed of colon), cataract extraction, L knee pain.  Patient denies hx of cancer, stroke, seizures, lung problem, diabetes, unexplained weight loss, unexplained changes  in bowel or bladder problems, unexplained stumbling or dropping things, spinal surgery. He has been exercising daily.     PRECAUTIONS: none  SUBJECTIVE: Patient reports he was sore all over after last PT session but that is better now. He states his back is hurting some when he is standing after doing a lot of walking around at church due to a computer  problem. He states PT is helping him lose weight, which he has been working on. He states his HEP is going okay.   PAIN:  Are you having pain? NPRS 1-2/10 when standing  OBJECTIVE   TODAY'S TREATMENT  Therapeutic exercise: to centralize symptoms and improve ROM, strength, muscular endurance, and activity tolerance required for successful completion of functional activities. - Seated scapular row at [place]: 3x10 at 35#. - Lat pull downs: 3x10, 35# (Manual therapy - see below) - prone press up, 3x10 - prone scapular retraction with upper trunk extension hands at sides of face, 3x10 (difficult) - STS with 2 KG med ball overhead raises. 2x10. Fatigues quickly with diminished upright posture limiting overhead ball raises. - standing cervical retraction/extension press into small green ball on wall while levering body away from wall, 3x10.  - Standing Y's on wall: 3x10 with 2#DB in each hand. - Pec door way stretch: 3x30 sec to pt tolerance.  - Seated thoracic extension over 1/2 foam roll: 2x10. Min TC/VC's to promote thoracic extension. Arms on occiput of head for pec stretch.   Manual therapy: to reduce pain and tissue tension, improve range of motion, neuromodulation, in order to promote improved ability to complete functional activities. PRONE with face in table face cradle - CPA/UPA grade III-IV thoracic spine T1 through L5. ~ 10-20 CPA and 5-10 reps alternating UPA each side per segment to improve mobility.  Pt required multimodal cuing for proper technique and to facilitate improved neuromuscular control, strength, range of motion, and functional ability resulting in improved performance and form.   PATIENT EDUCATION:  Education details: Exercise purpose/form. Self management techniques.  Reviewed cancelation/no-show policy with patient and confirmed patient has correct phone number for clinic; patient verbalized understanding (01/18/22). Person educated: Patient Education  method: Explanation, demonstration, VC, TC Education comprehension: verbalized understanding, demonstrated understanding, and needs further education     HOME EXERCISE PROGRAM: Access Code: [email or web address] URL: [email or web address] Date: 02/08/2022 Prepared by: Rosita Kea  Exercises - Sidelying Thoracic Rotation with Open Book  - 1-2 x daily - 3 sets - 10 reps - 5 seconds hold - Bridge  - 1-2 x daily - 3 sets - 10 reps - 5 seconds hold - Supine Lower Trunk Rotation  - 1 x daily - 3 sets - 15 reps - Prone Press Up  - 1-2 x daily - 3 sets - 10 reps - Bilateral Bent Leg Lift  - 1-2 x daily - 3 sets - 10 reps - Seated Thoracic Lumbar Extension  - Seated Flexion Stretch with Swiss Ball  - 1 x daily - 1 sets - 20 reps - 5 seconds hold - Baby Cobra Hands Hovering   - 1 x daily - 3 sets - 10 reps - 5 seconds hold   ASSESSMENT:   CLINICAL IMPRESSION: Patient tolerated treatment well today with less rest breaks. Continues to have difficulty maintaining upright posture reliably and is limited by back pain. Did not progress exercises this session due to soreness after last session. Patient would benefit from continued management of limiting condition by skilled physical therapist to address remaining  impairments and functional limitations to work towards stated goals and return to PLOF or maximal functional independence.     From PT eval 12/20/2021:  Patient is a 71 y.o. male referred to outpatient physical therapy with a medical diagnosis of lumbar DDD (degenerative disc disease, lumbar stenosis with neurogenic claudication, lumbar radiculitis who presents with signs and symptoms consistent with chronic low back and thoracic spine pain and stiffness with chronic kyphotic postural changes, hip extension weakness, and intermittent radicular symptoms contributing. Patient's pattern of symptoms suggests sustained spinal extension intolerance and difficulty with postural endurance is likely  contributing to condition. Previous episode of care was able to improve patient's quality of life some with decreased pain and improved function and patient has potential for improvement from his current state in these areas. Patient presents with significant pain, joint stiffness, posture, ROM, motor control, muscle performance (strength/power/endurance), and activity tolerance impairments that are limiting ability to complete his usual activities including anything that requires a lot of standing/walking, cooking for church/wine club, cleaning the house, waiting in line, standing longer than 5-20 min, having conversations with others while standing, yard work, Orthoptist, cleaning, sit <> stand, bending and lifting together, staining deck, shopping, walking more than 2 blocks, social participation, changing positions in the bed, golfing without difficulty. Patient will benefit from skilled physical therapy intervention to address current body structure impairments and activity limitations to improve function and work towards goals set in current POC in order to return to prior level of function or maximal functional improvement.      OBJECTIVE IMPAIRMENTS Abnormal gait, decreased activity tolerance, decreased endurance, decreased mobility, difficulty walking, decreased ROM, decreased strength, hypomobility, impaired perceived functional ability, increased muscle spasms, impaired flexibility, improper body mechanics, postural dysfunction, and pain.    ACTIVITY LIMITATIONS carrying, lifting, bending, standing, bed mobility, dressing, and locomotion level   PARTICIPATION LIMITATIONS: meal prep, cleaning, laundry, interpersonal relationship, shopping, community activity, yard work, church, and   asthma, Zenker's (hypopharyngeal) diverticulum, afib, GERD, hx of fatty liver, hypothyroidism, BPH, OA, hx adeomatous polyp of colon, inguinal hernia, L knee pain, hx of dysplastic nevus, colon surgery (7  inches removed of colon), cataract extraction, L knee pain   PERSONAL FACTORS Age, Past/current experiences, Time since onset of injury/illness/exacerbation, and 3+ comorbidities:   asthma, Zenker's (hypopharyngeal) diverticulum, afib, GERD, hx of fatty liver, hypothyroidism, BPH, OA, hx adeomatous polyp of colon, inguinal hernia, L knee pain, hx of dysplastic nevus, colon surgery (7 inches removed of colon), cataract extraction, L knee pain are also affecting patient's functional outcome.    REHAB POTENTIAL: Good   CLINICAL DECISION MAKING: Stable/uncomplicated   EVALUATION COMPLEXITY: Low     GOALS: Goals reviewed with patient? No   SHORT TERM GOALS: Target date: 01/03/2022   Patient will be independent with initial home exercise program for self-management of symptoms. Baseline: Initial HEP to be provided at visit 2 as appropriate (12/20/21); initial HEP provided visit 2 (12/26/2021);  Goal status: met    LONG TERM GOALS: Target date: 03/14/2022   Patient will be independent with a long-term home exercise program for self-management of symptoms.  Baseline: Initial HEP to be provided at visit 2 as appropriate (12/20/21); initial HEP provided visit 2 (12/26/2021); participating in HEP (01/23/2022);  Goal status: In-progress   2.  Patient will demonstrate improved FOTO to equal or greater than 67 by visit #11 to demonstrate improvement in overall condition and self-reported functional ability.  Baseline: 54 (12/20/21); 62 at visit # 10 (  01/23/2022);  Goal status: In-progress   3.  Patient will ambulate equal or greater than 1200 feet on 6 Minute Walk Test with no increase in symptoms to demonstrate improved community ambulation.  Baseline: to be tested visit 2 as appropriate (12/20/21); 1,225' up to 2/10 NPS (12/26/2021); 1263 feet with no AD, no complaint of pain while walking. Pain at waist when going to sit after walking. R knee pain (01/23/2022);  Goal status: Met 01/23/2022   4.   Patient will demonstrate the ability to lift 25# box floor to waist 1x10 times in a row with no increase in symptoms to improve his ability to bend and lift things around his home.  Baseline: reports difficulty with bending and lifting items at home  (12/20/21); 3 reps at 25#. Reported pain with each lift at low back increasing to 4/10 at 25# with request to stop (01/23/2022);  Goal status: In-progress   5.  Patient will report the ability to stand/walk equal or greater than 60 min before needing to sit to improve his ability to stand in line, socialize, and cook.  Baseline: 5-20 min (12/20/21); on his feet 60 min with salon pass on his back making hashbrown casserole this morning before he started to get pain, he feels he would not be able to do this without the salon pass (01/23/2022):  Goal status: Nearly met.      PLAN: PT FREQUENCY: 1-2x/week   PT DURATION: 12 weeks   PLANNED INTERVENTIONS: Therapeutic exercises, Therapeutic activity, Neuromuscular re-education, Patient/Family education, Joint mobilization, DME instructions, Dry Needling, Electrical stimulation, Spinal mobilization, Cryotherapy, Moist heat, Manual therapy, and Re-evaluation.   PLAN FOR NEXT SESSION: update HEP as appropriate, manual therapy and exercises for improved joint mobility in the spine.  Everlean Alstrom. Graylon Good, PT, DPT 03/13/22, 4:51 PM  [facility] [address] [place], [place] [address] P: [phone] I F: [phone]

## 2022-03-15 ENCOUNTER — Encounter: Payer: Self-pay | Admitting: Physical Therapy

## 2022-03-15 ENCOUNTER — Ambulatory Visit: Payer: Medicare Other | Admitting: Physical Therapy

## 2022-03-15 DIAGNOSIS — M546 Pain in thoracic spine: Secondary | ICD-10-CM

## 2022-03-15 DIAGNOSIS — R293 Abnormal posture: Secondary | ICD-10-CM | POA: Diagnosis not present

## 2022-03-15 DIAGNOSIS — M545 Low back pain, unspecified: Secondary | ICD-10-CM | POA: Diagnosis not present

## 2022-03-15 DIAGNOSIS — R262 Difficulty in walking, not elsewhere classified: Secondary | ICD-10-CM

## 2022-03-15 DIAGNOSIS — M5459 Other low back pain: Secondary | ICD-10-CM | POA: Diagnosis not present

## 2022-03-15 DIAGNOSIS — G8929 Other chronic pain: Secondary | ICD-10-CM | POA: Diagnosis not present

## 2022-03-15 NOTE — Therapy (Signed)
OUTPATIENT PHYSICAL THERAPY TREATMENT NOTE / PROGRESS NOTE / RE-CERTIFICATION Dates of reporting from 01/23/2022 to 03/15/2022  Patient Name: Daniel [name] MRN: [id number] DOB:1951/02/11, 71 y.o., male Today's Date: 03/15/2022  PCP: Jerrol Banana., MD REFERRING PROVIDER: Allene Dillon, NP  END OF SESSION:   PT End of Session - 03/15/22 1556     Visit Number 18    Number of Visits 24    Date for PT Re-Evaluation 06/07/22    Authorization Type [employer] MEDICARE reporting period from 01/23/2022    Progress Note Due on Visit 20    PT Start Time 1600    PT Stop Time 1640    PT Time Calculation (min) 40 min    Activity Tolerance Patient tolerated treatment well    Behavior During Therapy [facility] for tasks assessed/performed                Past Medical History:  Diagnosis Date   Actinic keratosis    Asthma    Eczema    Hx of dysplastic nevus 06/06/2006   Mid lower back, slight atypia   Hypothyroidism    Seasonal allergies    Thyroid disease    Past Surgical History:  Procedure Laterality Date   CATARACT EXTRACTION     COLON SURGERY     COLONOSCOPY WITH PROPOFOL N/A 11/18/2014   Procedure: COLONOSCOPY WITH PROPOFOL;  Surgeon: [name], MD;  Location: [facility] ENDOSCOPY;  Service: Endoscopy;  Laterality: N/A;   COLONOSCOPY WITH PROPOFOL N/A 12/13/2021   Procedure: COLONOSCOPY WITH PROPOFOL;  Surgeon: [name], MD;  Location: [facility] ENDOSCOPY;  Service: Endoscopy;  Laterality: N/A;   EYE SURGERY     KNEE SURGERY     NASAL SEPTUM SURGERY     Patient Active Problem List   Diagnosis Date Noted   Zenker's (hypopharyngeal) diverticulum 03/22/2018   Gastroesophageal reflux disease 03/22/2018   Fatty liver 10/09/2017   BPH (benign prostatic hyperplasia) 11/21/2014   Hypothyroidism 11/21/2014   Obesity 11/21/2014   Asthma 11/21/2014   Inguinal hernia 11/21/2014   OA (osteoarthritis) 11/21/2014   H/O adenomatous polyp of colon 10/14/2014    REFERRING  DIAG: lumbar DDD (degenerative disc disease), lumbar stenosis with neurogenic claudication, lumbar radiculitis  THERAPY DIAG:  Other low back pain  Pain in thoracic spine  Abnormal posture  Difficulty in walking, not elsewhere classified  Rationale for Evaluation and Treatment: Rehabilitation  PERTINENT HISTORY: Patient is a 71 y.o. male who presents to outpatient physical therapy with a referral for medical diagnosis lumbar DDD (degenerative disc disease, lumbar stenosis with neurogenic claudication, lumbar radiculitis. This patient's chief complaints consist of chronic pain in the low back and upper glutes that moves up towards his B scapulae with prolonged standing/walking and with intermittent shooting down his R left to the knee, leading to the following functional deficits: difficulty with anything that requires a lot of standing/walking, cooking for church/wine club, cleaning the house, waiting in line, standing longer than 5-20 min, having conversations with others while standing, yard work, Orthoptist, cleaning, sit <> stand, bending and lifting together, staining deck, shopping, walking more than 2 blocks, social participation, changing positions in the bed, golfing.    Relevant past medical history and comorbidities include asthma, Zenker's (hypopharyngeal) diverticulum, afib, GERD, hx of fatty liver, hypothyroidism, BPH, OA, hx adeomatous polyp of colon, inguinal hernia, L knee pain, hx of dysplastic nevus, colon surgery (7 inches removed of colon), cataract extraction, L knee pain.  Patient denies  hx of cancer, stroke, seizures, lung problem, diabetes, unexplained weight loss, unexplained changes in bowel or bladder problems, unexplained stumbling or dropping things, spinal surgery. He has been exercising daily.     PRECAUTIONS: none  SUBJECTIVE: Patients reports he is sleepy after "going all day." His back is hurting today at the low back 3-4/10 and he was sore after last  PT session. He feels that PT is helping him by helping him be not as sore as he usually is. He stood 15 min recently to make up the bed, but other than that has not tested his standing tolerance lately.   PAIN:  Are you having pain? NPRS 3-4/10  in low back.   OBJECTIVE   SELF-REPORTED FUNCTION FOTO score: 65/100 (lumbar spine questionnaire)   FUNCTIONAL/BALANCE TEST 6 Minute Walk Test: 1347 feet with no AD, stiffening at about  3 min. Pain at right scapula up to 4-5/10. R knee pain.    - floor to waist lift with box 10-25# with 5# increase each rep, then 1x10 reps at 25#. Reported pain with each lift at low back increasing to 5/10 at 25#. Incomplete stand and occasionally missing touching floor.    TODAY'S TREATMENT  Therapeutic exercise: to centralize symptoms and improve ROM, strength, muscular endurance, and activity tolerance required for successful completion of functional activities. - measurements to assess progress - Lat pull downs: 3x10, 25# - Seated scapular row at [facility] machine: 3x10 at 35#. (Manual therapy - see below) - prone press up, 3x10 - prone scapular retraction with upper trunk extension hands at sides of face, 3x10 (difficult) - STS with green theraball overhead raises to tap wall or theraball stand overhead. 3x10. difficutl  Manual therapy: to reduce pain and tissue tension, improve range of motion, neuromodulation, in order to promote improved ability to complete functional activities. PRONE with face in table face cradle - CPA grade III-IV thoracic spine T2 through L5. ~ 10-20 CPA per segment to improve mobility.  Pt required multimodal cuing for proper technique and to facilitate improved neuromuscular control, strength, range of motion, and functional ability resulting in improved performance and form.   PATIENT EDUCATION:  Education details: Exercise purpose/form. Self management techniques.  Reviewed cancelation/no-show policy with patient and confirmed  patient has correct phone number for clinic; patient verbalized understanding (01/18/22). Person educated: Patient Education method: Explanation, demonstration, VC, TC Education comprehension: verbalized understanding, demonstrated understanding, and needs further education     HOME EXERCISE PROGRAM: Access Code: [email or web address] URL: [email or web address] Date: 02/08/2022 Prepared by: Rosita Kea  Exercises - Sidelying Thoracic Rotation with Open Book  - 1-2 x daily - 3 sets - 10 reps - 5 seconds hold - Bridge  - 1-2 x daily - 3 sets - 10 reps - 5 seconds hold - Supine Lower Trunk Rotation  - 1 x daily - 3 sets - 15 reps - Prone Press Up  - 1-2 x daily - 3 sets - 10 reps - Bilateral Bent Leg Lift  - 1-2 x daily - 3 sets - 10 reps - Seated Thoracic Lumbar Extension  - Seated Flexion Stretch with Swiss Ball  - 1 x daily - 1 sets - 20 reps - 5 seconds hold - Baby Cobra Hands Hovering   - 1 x daily - 3 sets - 10 reps - 5 seconds hold   ASSESSMENT:   CLINICAL IMPRESSION: Patient has attended 18 physical therapy sessions since starting current episode of care on 12/20/2021. Patient has shown improvements in [place]  score (lumbar), floor to waist lift, 6 Minute Walk Test, and HEP participation. He has variable report of standing tolerance, up to 60 min last progress note and at least 15 min today. Patient continues to have difficulty with activities requiring standing and upright postures such as cooking, yardwork, and walking. He has not yet reached his max potential improvement and would benefit from continued PT. Patient would benefit from continued management of limiting condition by skilled physical therapist to address remaining impairments and functional limitations to work towards stated goals and return to PLOF or maximal functional independence.     From PT eval 12/20/2021:  Patient is a 71 y.o. male referred to outpatient physical therapy with a medical diagnosis of lumbar DDD  (degenerative disc disease, lumbar stenosis with neurogenic claudication, lumbar radiculitis who presents with signs and symptoms consistent with chronic low back and thoracic spine pain and stiffness with chronic kyphotic postural changes, hip extension weakness, and intermittent radicular symptoms contributing. Patient's pattern of symptoms suggests sustained spinal extension intolerance and difficulty with postural endurance is likely contributing to condition. Previous episode of care was able to improve patient's quality of life some with decreased pain and improved function and patient has potential for improvement from his current state in these areas. Patient presents with significant pain, joint stiffness, posture, ROM, motor control, muscle performance (strength/power/endurance), and activity tolerance impairments that are limiting ability to complete his usual activities including anything that requires a lot of standing/walking, cooking for church/wine club, cleaning the house, waiting in line, standing longer than 5-20 min, having conversations with others while standing, yard work, operating hedge trimmer, cleaning, sit <> stand, bending and lifting together, staining deck, shopping, walking more than 2 blocks, social participation, changing positions in the bed, golfing without difficulty. Patient will benefit from skilled physical therapy intervention to address current body structure impairments and activity limitations to improve function and work towards goals set in current POC in order to return to prior level of function or maximal functional improvement.      OBJECTIVE IMPAIRMENTS Abnormal gait, decreased activity tolerance, decreased endurance, decreased mobility, difficulty walking, decreased ROM, decreased strength, hypomobility, impaired perceived functional ability, increased muscle spasms, impaired flexibility, improper body mechanics, postural dysfunction, and pain.    ACTIVITY  LIMITATIONS carrying, lifting, bending, standing, bed mobility, dressing, and locomotion level   PARTICIPATION LIMITATIONS: meal prep, cleaning, laundry, interpersonal relationship, shopping, community activity, yard work, church, and   asthma, Zenker's (hypopharyngeal) diverticulum, afib, GERD, hx of fatty liver, hypothyroidism, BPH, OA, hx adeomatous polyp of colon, inguinal hernia, L knee pain, hx of dysplastic nevus, colon surgery (7 inches removed of colon), cataract extraction, L knee pain   PERSONAL FACTORS Age, Past/current experiences, Time since onset of injury/illness/exacerbation, and 3+ comorbidities:   asthma, Zenker's (hypopharyngeal) diverticulum, afib, GERD, hx of fatty liver, hypothyroidism, BPH, OA, hx adeomatous polyp of colon, inguinal hernia, L knee pain, hx of dysplastic nevus, colon surgery (7 inches removed of colon), cataract extraction, L knee pain are also affecting patient's functional outcome.    REHAB POTENTIAL: Good   CLINICAL DECISION MAKING: Stable/uncomplicated   EVALUATION COMPLEXITY: Low     GOALS: Goals reviewed with patient? yes   SHORT TERM GOALS: Target date: 01/03/2022   Patient will be independent with initial home exercise program for self-management of symptoms. Baseline: Initial HEP to be provided at visit 2 as appropriate (12/20/21); initial HEP provided visit 2 (12/26/2021);  Goal status: met    LONG TERM GOALS: Target   date: 03/14/2022. UPDATED to 06/07/2022 for all unmet goals on 03/15/2022.    Patient will be independent with a long-term home exercise program for self-management of symptoms.  Baseline: Initial HEP to be provided at visit 2 as appropriate (12/20/21); initial HEP provided visit 2 (12/26/2021); participating in HEP (01/23/2022); currently participating (03/15/2022);   Goal status: In-progress   2.  Patient will demonstrate improved FOTO to equal or greater than 67 by visit #11 to demonstrate improvement in overall condition and  self-reported functional ability.  Baseline: 54 (12/20/21); 62 at visit # 10 (01/23/2022); 65 at visit #18 (03/15/2022); Goal status: In-progress   3.  Patient will ambulate equal or greater than 1200 feet on 6 Minute Walk Test with no increase in symptoms to demonstrate improved community ambulation.  Baseline: to be tested visit 2 as appropriate (12/20/21); 1,225' up to 2/10 NPS (12/26/2021); 1263 feet with no AD, no complaint of pain while walking. Pain at waist when going to sit after walking. R knee pain (01/23/2022); 1347 feet with no AD, stiffening at about  3 min. Pain at right scapula up to 4-5/10. R knee pain (03/15/2022);  Goal status: Met 01/23/2022   4.  Patient will demonstrate the ability to lift 25# box floor to waist 1x10 times in a row with no increase in symptoms to improve his ability to bend and lift things around his home.  Baseline: reports difficulty with bending and lifting items at home  (12/20/21); 3 reps at 25#. Reported pain with each lift at low back increasing to 4/10 at 25# with request to stop (01/23/2022); 1x10 reps at 25#. Reported pain with each lift at low back increasing to 5/10 at 25#. Incomplete stand and occasionally missing touching floor (03/15/2022);  Goal status: In-progress   5.  Patient will report the ability to stand/walk equal or greater than 60 min before needing to sit to improve his ability to stand in line, socialize, and cook.  Baseline: 5-20 min (12/20/21); on his feet 60 min with salon pass on his back making hashbrown casserole this morning before he started to get pain, he feels he would not be able to do this without the salon pass (01/23/2022): Has not tested it recently, there is a party coming up where he will be cooking in November (03/15/2022);  Goal status: Nearly met.      PLAN: PT FREQUENCY: 1-2x/week   PT DURATION: 12 weeks   PLANNED INTERVENTIONS: Therapeutic exercises, Therapeutic activity, Neuromuscular re-education,  Patient/Family education, Joint mobilization, DME instructions, Dry Needling, Electrical stimulation, Spinal mobilization, Cryotherapy, Moist heat, Manual therapy, and Re-evaluation.   PLAN FOR NEXT SESSION: update HEP as appropriate, manual therapy and exercises for improved joint mobility in the spine.  Sara R. Snyder, PT, DPT 03/15/22, 5:45 PM  [facility] [address] [place], [place] [address] P: [phone] I F: [phone] 

## 2022-03-16 ENCOUNTER — Encounter: Payer: Self-pay | Admitting: Family Medicine

## 2022-03-17 MED ORDER — PREDNISONE 20 MG PO TABS: 20.0000 mg | ORAL_TABLET | Freq: Every day | ORAL | 1 refills | Status: DC

## 2022-03-20 ENCOUNTER — Ambulatory Visit: Payer: Medicare Other | Admitting: Physical Therapy

## 2022-03-20 ENCOUNTER — Encounter: Payer: Self-pay | Admitting: Physical Therapy

## 2022-03-20 DIAGNOSIS — M545 Low back pain, unspecified: Secondary | ICD-10-CM | POA: Diagnosis not present

## 2022-03-20 DIAGNOSIS — R262 Difficulty in walking, not elsewhere classified: Secondary | ICD-10-CM | POA: Diagnosis not present

## 2022-03-20 DIAGNOSIS — R293 Abnormal posture: Secondary | ICD-10-CM | POA: Diagnosis not present

## 2022-03-20 DIAGNOSIS — M546 Pain in thoracic spine: Secondary | ICD-10-CM | POA: Diagnosis not present

## 2022-03-20 DIAGNOSIS — M5459 Other low back pain: Secondary | ICD-10-CM | POA: Diagnosis not present

## 2022-03-20 DIAGNOSIS — G8929 Other chronic pain: Secondary | ICD-10-CM | POA: Diagnosis not present

## 2022-03-20 NOTE — Therapy (Addendum)
OUTPATIENT PHYSICAL THERAPY TREATMENT NOTE   Patient Name: Daniel [name] MRN: [id number] DOB:1951-05-03, 71 y.o., male Today's Date: 03/20/2022  PCP: Jerrol Banana., MD REFERRING PROVIDER: Allene Dillon, NP  END OF SESSION:   PT End of Session - 03/20/22 1631     Visit Number 19    Number of Visits 24    Date for PT Re-Evaluation 06/07/22    Authorization Type [employer] MEDICARE reporting period from 01/23/2022    Progress Note Due on Visit 20    PT Start Time 1612    PT Stop Time 1650    PT Time Calculation (min) 38 min    Activity Tolerance Patient tolerated treatment well    Behavior During Therapy [facility] for tasks assessed/performed                 Past Medical History:  Diagnosis Date   Actinic keratosis    Asthma    Eczema    Hx of dysplastic nevus 06/06/2006   Mid lower back, slight atypia   Hypothyroidism    Seasonal allergies    Thyroid disease    Past Surgical History:  Procedure Laterality Date   CATARACT EXTRACTION     COLON SURGERY     COLONOSCOPY WITH PROPOFOL N/A 11/18/2014   Procedure: COLONOSCOPY WITH PROPOFOL;  Surgeon: [name], MD;  Location: [facility] ENDOSCOPY;  Service: Endoscopy;  Laterality: N/A;   COLONOSCOPY WITH PROPOFOL N/A 12/13/2021   Procedure: COLONOSCOPY WITH PROPOFOL;  Surgeon: [name], MD;  Location: [facility] ENDOSCOPY;  Service: Endoscopy;  Laterality: N/A;   EYE SURGERY     KNEE SURGERY     NASAL SEPTUM SURGERY     Patient Active Problem List   Diagnosis Date Noted   Zenker's (hypopharyngeal) diverticulum 03/22/2018   Gastroesophageal reflux disease 03/22/2018   Fatty liver 10/09/2017   BPH (benign prostatic hyperplasia) 11/21/2014   Hypothyroidism 11/21/2014   Obesity 11/21/2014   Asthma 11/21/2014   Inguinal hernia 11/21/2014   OA (osteoarthritis) 11/21/2014   H/O adenomatous polyp of colon 10/14/2014    REFERRING DIAG: lumbar DDD (degenerative disc disease), lumbar stenosis with neurogenic  claudication, lumbar radiculitis  THERAPY DIAG:  Other low back pain  Pain in thoracic spine  Abnormal posture  Difficulty in walking, not elsewhere classified  Rationale for Evaluation and Treatment: Rehabilitation  PERTINENT HISTORY: Patient is a 70 y.o. male who presents to outpatient physical therapy with a referral for medical diagnosis lumbar DDD (degenerative disc disease, lumbar stenosis with neurogenic claudication, lumbar radiculitis. This patient's chief complaints consist of chronic pain in the low back and upper glutes that moves up towards his B scapulae with prolonged standing/walking and with intermittent shooting down his R left to the knee, leading to the following functional deficits: difficulty with anything that requires a lot of standing/walking, cooking for church/wine club, cleaning the house, waiting in line, standing longer than 5-20 min, having conversations with others while standing, yard work, Orthoptist, cleaning, sit <> stand, bending and lifting together, staining deck, shopping, walking more than 2 blocks, social participation, changing positions in the bed, golfing.    Relevant past medical history and comorbidities include asthma, Zenker's (hypopharyngeal) diverticulum, afib, GERD, hx of fatty liver, hypothyroidism, BPH, OA, hx adeomatous polyp of colon, inguinal hernia, L knee pain, hx of dysplastic nevus, colon surgery (7 inches removed of colon), cataract extraction, L knee pain.  Patient denies hx of cancer, stroke, seizures, lung problem, diabetes, unexplained weight  loss, unexplained changes in bowel or bladder problems, unexplained stumbling or dropping things, spinal surgery. He has been exercising daily.     PRECAUTIONS: none  SUBJECTIVE: Patient reports 2/10 pain in the low back.  He states he had the best day he has had in a long time with minimal to no pain yesterday and he does not know why. He had a little pain in the morning, then it  went away. He has not changed his medications. He is going to [place] this coming weekend. He states he was sore all over after last PT session.   PAIN:  Are you having pain? NPRS 2/10  in low back.   OBJECTIVE  TODAY'S TREATMENT  Therapeutic exercise: to centralize symptoms and improve ROM, strength, muscular endurance, and activity tolerance required for successful completion of functional activities. - measurements to assess progress - Lat pull downs: 3x10, 35# - Seated scapular row at [facility] machine: 3x10 at 35#. (Manual therapy - see below) - prone press up, 3x10 - prone scapular retraction with upper trunk extension hands at sides of face, 3x10 (difficult) - STS from 18 inch chair (Green) with green theraball overhead raises to tap theraball stand overhead. 2x8. Difficult and unable to do more due to knee burning.  - standing upper trunk extension off wall levering off of head pressing into small green ball on wall. 3x10.  - standing underhand tosses on wall over head with small green ball to improve postural strength and endurance, 3x20.  - standing bilateral pec stretch in doorway, 3x30 seconds.   Manual therapy: to reduce pain and tissue tension, improve range of motion, neuromodulation, in order to promote improved ability to complete functional activities. PRONE with face in table face cradle - CPA grade III-IV thoracic spine T2 through L5. ~ 40 CPA per segment to improve mobility.  Pt required multimodal cuing for proper technique and to facilitate improved neuromuscular control, strength, range of motion, and functional ability resulting in improved performance and form.   PATIENT EDUCATION:  Education details: Exercise purpose/form. Self management techniques.  Reviewed cancelation/no-show policy with patient and confirmed patient has correct phone number for clinic; patient verbalized understanding (01/18/22). Person educated: Patient Education method: Explanation,  demonstration, VC, TC Education comprehension: verbalized understanding, demonstrated understanding, and needs further education     HOME EXERCISE PROGRAM: Access Code: [email or web address] URL: [email or web address] Date: 02/08/2022 Prepared by: Rosita Kea  Exercises - Sidelying Thoracic Rotation with Open Book  - 1-2 x daily - 3 sets - 10 reps - 5 seconds hold - Bridge  - 1-2 x daily - 3 sets - 10 reps - 5 seconds hold - Supine Lower Trunk Rotation  - 1 x daily - 3 sets - 15 reps - Prone Press Up  - 1-2 x daily - 3 sets - 10 reps - Bilateral Bent Leg Lift  - 1-2 x daily - 3 sets - 10 reps - Seated Thoracic Lumbar Extension  - Seated Flexion Stretch with Swiss Ball  - 1 x daily - 1 sets - 20 reps - 5 seconds hold - Baby Cobra Hands Hovering   - 1 x daily - 3 sets - 10 reps - 5 seconds hold   ASSESSMENT:   CLINICAL IMPRESSION: Patient arrives reporting significantly decreased pain yesterday for no identifiable reason. Interventions to improve postural ROM, motor control, and strength/endurance continued today. Patient appears appropriately challenged by exercises. He was unable to complete more repetitions or sets of sit <> stand exercise due to  burning in B knees. He requires cuing for improved form and performance of activities as well as encouragement to complete sufficient exercise volume due to quick fatigue. Plan to complete 20th visit progress note next session. Patient would benefit from continued management of limiting condition by skilled physical therapist to address remaining impairments and functional limitations to work towards stated goals and return to PLOF or maximal functional independence.     From PT eval 12/20/2021:  Patient is a 71 y.o. male referred to outpatient physical therapy with a medical diagnosis of lumbar DDD (degenerative disc disease, lumbar stenosis with neurogenic claudication, lumbar radiculitis who presents with signs and symptoms consistent with  chronic low back and thoracic spine pain and stiffness with chronic kyphotic postural changes, hip extension weakness, and intermittent radicular symptoms contributing. Patient's pattern of symptoms suggests sustained spinal extension intolerance and difficulty with postural endurance is likely contributing to condition. Previous episode of care was able to improve patient's quality of life some with decreased pain and improved function and patient has potential for improvement from his current state in these areas. Patient presents with significant pain, joint stiffness, posture, ROM, motor control, muscle performance (strength/power/endurance), and activity tolerance impairments that are limiting ability to complete his usual activities including anything that requires a lot of standing/walking, cooking for church/wine club, cleaning the house, waiting in line, standing longer than 5-20 min, having conversations with others while standing, yard work, Orthoptist, cleaning, sit <> stand, bending and lifting together, staining deck, shopping, walking more than 2 blocks, social participation, changing positions in the bed, golfing without difficulty. Patient will benefit from skilled physical therapy intervention to address current body structure impairments and activity limitations to improve function and work towards goals set in current POC in order to return to prior level of function or maximal functional improvement.      OBJECTIVE IMPAIRMENTS Abnormal gait, decreased activity tolerance, decreased endurance, decreased mobility, difficulty walking, decreased ROM, decreased strength, hypomobility, impaired perceived functional ability, increased muscle spasms, impaired flexibility, improper body mechanics, postural dysfunction, and pain.    ACTIVITY LIMITATIONS carrying, lifting, bending, standing, bed mobility, dressing, and locomotion level   PARTICIPATION LIMITATIONS: meal prep, cleaning,  laundry, interpersonal relationship, shopping, community activity, yard work, church, and   asthma, Zenker's (hypopharyngeal) diverticulum, afib, GERD, hx of fatty liver, hypothyroidism, BPH, OA, hx adeomatous polyp of colon, inguinal hernia, L knee pain, hx of dysplastic nevus, colon surgery (7 inches removed of colon), cataract extraction, L knee pain   PERSONAL FACTORS Age, Past/current experiences, Time since onset of injury/illness/exacerbation, and 3+ comorbidities:   asthma, Zenker's (hypopharyngeal) diverticulum, afib, GERD, hx of fatty liver, hypothyroidism, BPH, OA, hx adeomatous polyp of colon, inguinal hernia, L knee pain, hx of dysplastic nevus, colon surgery (7 inches removed of colon), cataract extraction, L knee pain are also affecting patient's functional outcome.    REHAB POTENTIAL: Good   CLINICAL DECISION MAKING: Stable/uncomplicated   EVALUATION COMPLEXITY: Low     GOALS: Goals reviewed with patient? yes   SHORT TERM GOALS: Target date: 01/03/2022   Patient will be independent with initial home exercise program for self-management of symptoms. Baseline: Initial HEP to be provided at visit 2 as appropriate (12/20/21); initial HEP provided visit 2 (12/26/2021);  Goal status: met    LONG TERM GOALS: Target date: 03/14/2022. UPDATED to 06/07/2022 for all unmet goals on 03/15/2022.    Patient will be independent with a long-term home exercise program for self-management of symptoms.  Baseline:  Initial HEP to be provided at visit 2 as appropriate (12/20/21); initial HEP provided visit 2 (12/26/2021); participating in HEP (01/23/2022); currently participating (03/15/2022);   Goal status: In-progress   2.  Patient will demonstrate improved FOTO to equal or greater than 67 by visit #11 to demonstrate improvement in overall condition and self-reported functional ability.  Baseline: 54 (12/20/21); 62 at visit # 10 (01/23/2022); 65 at visit #18 (03/15/2022); Goal status: In-progress    3.  Patient will ambulate equal or greater than 1200 feet on 6 Minute Walk Test with no increase in symptoms to demonstrate improved community ambulation.  Baseline: to be tested visit 2 as appropriate (12/20/21); 1,225' up to 2/10 NPS (12/26/2021); 1263 feet with no AD, no complaint of pain while walking. Pain at waist when going to sit after walking. R knee pain (01/23/2022); 1347 feet with no AD, stiffening at about  3 min. Pain at right scapula up to 4-5/10. R knee pain (03/15/2022);  Goal status: Met 01/23/2022   4.  Patient will demonstrate the ability to lift 25# box floor to waist 1x10 times in a row with no increase in symptoms to improve his ability to bend and lift things around his home.  Baseline: reports difficulty with bending and lifting items at home  (12/20/21); 3 reps at 25#. Reported pain with each lift at low back increasing to 4/10 at 25# with request to stop (01/23/2022); 1x10 reps at 25#. Reported pain with each lift at low back increasing to 5/10 at 25#. Incomplete stand and occasionally missing touching floor (03/15/2022);  Goal status: In-progress   5.  Patient will report the ability to stand/walk equal or greater than 60 min before needing to sit to improve his ability to stand in line, socialize, and cook.  Baseline: 5-20 min (12/20/21); on his feet 60 min with salon pass on his back making hashbrown casserole this morning before he started to get pain, he feels he would not be able to do this without the salon pass (01/23/2022): Has not tested it recently, there is a party coming up where he will be cooking in November (03/15/2022);  Goal status: Nearly met.      PLAN: PT FREQUENCY: 1-2x/week   PT DURATION: 12 weeks   PLANNED INTERVENTIONS: Therapeutic exercises, Therapeutic activity, Neuromuscular re-education, Patient/Family education, Joint mobilization, DME instructions, Dry Needling, Electrical stimulation, Spinal mobilization, Cryotherapy, Moist heat, Manual  therapy, and Re-evaluation.   PLAN FOR NEXT SESSION: update HEP as appropriate, manual therapy and exercises for improved joint mobility in the spine.  Everlean Alstrom. Graylon Good, PT, DPT 03/20/22, 5:15 PM  [facility] [address] [place], [place] [address] P: [phone] I F: [phone]

## 2022-03-22 ENCOUNTER — Encounter: Payer: Medicare Other | Admitting: Physical Therapy

## 2022-03-22 DIAGNOSIS — M47816 Spondylosis without myelopathy or radiculopathy, lumbar region: Secondary | ICD-10-CM | POA: Diagnosis not present

## 2022-03-27 ENCOUNTER — Encounter: Payer: Medicare Other | Admitting: Physical Therapy

## 2022-03-27 NOTE — Progress Notes (Unsigned)
   I,Ruhaan Nordahl S Nyx Keady,acting as a Education administrator for Lavon Paganini, MD.,have documented all relevant documentation on the behalf of Lavon Paganini, MD,as directed by  Lavon Paganini, MD while in the presence of Lavon Paganini, MD.     Established patient visit   Patient: Daniel [name]   DOB: 01-16-1951   71 y.o. Male  MRN: [id number] Visit Date: 03/28/2022  Today's healthcare provider: Lavon Paganini, MD   No chief complaint on file.  Subjective    HPI  Follow up for hypotension  The patient was last seen for this 3 months ago. Changes made at last visit include stop florinef.  He reports {excellent/good/fair/poor:19665} compliance with treatment. He feels that condition is {improved/worse/unchanged:3041574}. He {is/is not:21021397} having side effects. ***  -----------------------------------------------------------------------------------------   Medications: Outpatient Medications Prior to Visit  Medication Sig   albuterol (VENTOLIN HFA) 108 (90 Base) MCG/ACT inhaler Inhale 2 puffs into the lungs every 6 (six) hours as needed for wheezing or shortness of breath.   ELIQUIS 5 MG TABS tablet Take 5 mg by mouth 2 (two) times daily.   Famotidine (PEPCID PO) Take 1 tablet by mouth daily.   fludrocortisone (FLORINEF) 0.1 MG tablet Take 1 tablet (0.1 mg total) by mouth daily.   fluticasone (FLONASE) 50 MCG/ACT nasal spray Place 2 sprays into both nostrils daily.   gabapentin (NEURONTIN) 100 MG capsule Take 100 mg by mouth at bedtime.   levothyroxine (SYNTHROID) 100 MCG tablet TAKE 1 TABLET BY MOUTH EVERY DAY BEFORE BREAKFAST   methocarbamol (ROBAXIN) 500 MG tablet Take 1 tablet (500 mg total) by mouth 3 (three) times daily.   metoprolol succinate (TOPROL-XL) 25 MG 24 hr tablet Take 25 mg by mouth daily.   midodrine (PROAMATINE) 2.5 MG tablet Take 1 tablet (2.5 mg total) by mouth 2 (two) times daily with a meal.   Multiple Vitamin (MULTIVITAMIN) capsule Take 1 capsule  daily by mouth. (Patient not taking: Reported on 12/13/2021)   pantoprazole (PROTONIX) 40 MG tablet TAKE 1 TABLET(40 MG) BY MOUTH TWICE DAILY   predniSONE (DELTASONE) 20 MG tablet Take 1 tablet (20 mg total) by mouth daily with breakfast.   No facility-administered medications prior to visit.    Review of Systems  {Labs  Heme  Chem  Endocrine  Serology  Results Review (optional):23779}   Objective    There were no vitals taken for this visit. {Show previous vital signs (optional):23777}  Physical Exam  ***  No results found for any visits on 03/28/22.  Assessment & Plan     ***  No follow-ups on file.      {provider attestation***:1}   Lavon Paganini, MD  [facility] [phone] (phone) [phone] (fax)  [place]

## 2022-03-28 ENCOUNTER — Encounter: Payer: Self-pay | Admitting: Family Medicine

## 2022-03-28 ENCOUNTER — Ambulatory Visit (INDEPENDENT_AMBULATORY_CARE_PROVIDER_SITE_OTHER): Payer: Medicare Other | Admitting: Family Medicine

## 2022-03-28 VITALS — BP 127/81 | HR 57 | Temp 98.7°F | Resp 16 | Wt 205.9 lb

## 2022-03-28 DIAGNOSIS — I950 Idiopathic hypotension: Secondary | ICD-10-CM

## 2022-03-28 DIAGNOSIS — M791 Myalgia, unspecified site: Secondary | ICD-10-CM

## 2022-03-28 DIAGNOSIS — I480 Paroxysmal atrial fibrillation: Secondary | ICD-10-CM | POA: Diagnosis not present

## 2022-03-28 DIAGNOSIS — M4306 Spondylolysis, lumbar region: Secondary | ICD-10-CM | POA: Diagnosis not present

## 2022-03-28 DIAGNOSIS — K219 Gastro-esophageal reflux disease without esophagitis: Secondary | ICD-10-CM

## 2022-03-28 DIAGNOSIS — D6869 Other thrombophilia: Secondary | ICD-10-CM

## 2022-03-28 DIAGNOSIS — E559 Vitamin D deficiency, unspecified: Secondary | ICD-10-CM

## 2022-03-28 DIAGNOSIS — J4520 Mild intermittent asthma, uncomplicated: Secondary | ICD-10-CM

## 2022-03-28 DIAGNOSIS — E785 Hyperlipidemia, unspecified: Secondary | ICD-10-CM

## 2022-03-28 DIAGNOSIS — T466X5A Adverse effect of antihyperlipidemic and antiarteriosclerotic drugs, initial encounter: Secondary | ICD-10-CM

## 2022-03-28 DIAGNOSIS — E039 Hypothyroidism, unspecified: Secondary | ICD-10-CM

## 2022-03-28 DIAGNOSIS — R269 Unspecified abnormalities of gait and mobility: Secondary | ICD-10-CM

## 2022-03-28 MED ORDER — PREDNISONE 20 MG PO TABS: 20.0000 mg | ORAL_TABLET | Freq: Every day | ORAL | 1 refills | Status: DC

## 2022-03-28 NOTE — Assessment & Plan Note (Signed)
A-fib on anticoagulation

## 2022-03-28 NOTE — Assessment & Plan Note (Signed)
Patient with stiff gait today-his upper body is stiff and his arms do not move much while he walks and he is leaning forward He does not seem to have a shuffling gait and he does not have any freezing episodes, but he is concerned about the possibility of early Parkinson's as he is also seen handwriting change I do not see the classic tremor or gait of Parkinson's, but patient is concerned about this and would like evaluation by neurology Neurology referral placed today

## 2022-03-28 NOTE — Assessment & Plan Note (Signed)
Seems to have resolved He is no longer on florinef or midodrine and BP is wnl Continue to monitor

## 2022-03-28 NOTE — Assessment & Plan Note (Signed)
Chronic and stable Albuterol as needed No current exacerbation

## 2022-03-28 NOTE — Assessment & Plan Note (Signed)
Previously well controlled Continue Synthroid at current dose  Recheck TSH and adjust Synthroid as indicated   

## 2022-03-28 NOTE — Assessment & Plan Note (Signed)
Followed by [facility] cardiology He is in normal sinus rhythm today Having infrequent episodes of A-fib Continue metoprolol at current dose Continue Eliquis for anticoagulation Continue to monitor CBC

## 2022-03-28 NOTE — Assessment & Plan Note (Signed)
Chronic and well-controlled Continue PPI at current dose

## 2022-03-28 NOTE — Assessment & Plan Note (Signed)
Continue supplement Recheck level 

## 2022-03-28 NOTE — Assessment & Plan Note (Signed)
Reviewed last lipid panel Not currently on a statin-history of arthralgias and myalgias from statin Recheck FLP and CMP Discussed diet and exercise

## 2022-03-28 NOTE — Assessment & Plan Note (Signed)
Followed by [facility] Doing well physical therapy Continue gabapentin and tramadol per physiatry Takes prednisone 20 mg daily for short trips to [place] intermittently through the year Refilled for his next 3-day trip

## 2022-03-29 ENCOUNTER — Ambulatory Visit: Payer: Medicare Other | Attending: Family Medicine | Admitting: Physical Therapy

## 2022-03-29 ENCOUNTER — Encounter: Payer: Self-pay | Admitting: Physical Therapy

## 2022-03-29 DIAGNOSIS — M5459 Other low back pain: Secondary | ICD-10-CM | POA: Insufficient documentation

## 2022-03-29 DIAGNOSIS — R262 Difficulty in walking, not elsewhere classified: Secondary | ICD-10-CM | POA: Insufficient documentation

## 2022-03-29 DIAGNOSIS — M546 Pain in thoracic spine: Secondary | ICD-10-CM | POA: Diagnosis not present

## 2022-03-29 DIAGNOSIS — R293 Abnormal posture: Secondary | ICD-10-CM | POA: Diagnosis not present

## 2022-03-29 LAB — COMPREHENSIVE METABOLIC PANEL
ALT: 7 IU/L (ref 0–44)
AST: 11 IU/L (ref 0–40)
Albumin/Globulin Ratio: 1.9 (ref 1.2–2.2)
Albumin: 4.3 g/dL (ref 3.8–4.8)
Alkaline Phosphatase: 69 IU/L (ref 44–121)
BUN/Creatinine Ratio: 17 (ref 10–24)
BUN: 18 mg/dL (ref 8–27)
Bilirubin Total: 0.7 mg/dL (ref 0.0–1.2)
CO2: 23 mmol/L (ref 20–29)
Calcium: 9.3 mg/dL (ref 8.6–10.2)
Chloride: 103 mmol/L (ref 96–106)
Creatinine, Ser: 1.07 mg/dL (ref 0.76–1.27)
Globulin, Total: 2.3 g/dL (ref 1.5–4.5)
Glucose: 95 mg/dL (ref 70–99)
Potassium: 4.1 mmol/L (ref 3.5–5.2)
Sodium: 143 mmol/L (ref 134–144)
Total Protein: 6.6 g/dL (ref 6.0–8.5)
eGFR: 74 mL/min/{1.73_m2} (ref >59)

## 2022-03-29 LAB — CBC
Hematocrit: 46.6 % (ref 37.5–51.0)
Hemoglobin: 16.3 g/dL (ref 13.0–17.7)
MCH: 28.9 pg (ref 26.6–33.0)
MCHC: 35 g/dL (ref 31.5–35.7)
MCV: 83 fL (ref 79–97)
Platelets: 275 10*3/uL (ref 150–450)
RBC: 5.64 x10E6/uL (ref 4.14–5.80)
RDW: 12.3 % (ref 11.6–15.4)
WBC: 7.6 10*3/uL (ref 3.4–10.8)

## 2022-03-29 LAB — LIPID PANEL
Chol/HDL Ratio: 2.8 ratio (ref 0.0–5.0)
Cholesterol, Total: 203 mg/dL — ABNORMAL HIGH (ref 100–199)
HDL: 73 mg/dL (ref >39)
LDL Chol Calc (NIH): 116 mg/dL — ABNORMAL HIGH (ref 0–99)
Triglycerides: 78 mg/dL (ref 0–149)
VLDL Cholesterol Cal: 14 mg/dL (ref 5–40)

## 2022-03-29 LAB — TSH: TSH: 1.18 u[IU]/mL (ref 0.450–4.500)

## 2022-03-29 LAB — VITAMIN D 25 HYDROXY (VIT D DEFICIENCY, FRACTURES): Vit D, 25-Hydroxy: 27 ng/mL — ABNORMAL LOW (ref 30.0–100.0)

## 2022-03-29 NOTE — Therapy (Signed)
OUTPATIENT PHYSICAL THERAPY TREATMENT NOTE / PROGRESS NOTE Dates of reporting from 01/23/2022 to 03/29/2022  Patient Name: Daniel [name] MRN: [id number] DOB:1950-10-01, 71 y.o., male Today's Date: 03/29/2022  PCP: Jerrol Banana., MD REFERRING PROVIDER: Allene Dillon, NP  END OF SESSION:   PT End of Session - 03/29/22 1600     Visit Number 20    Number of Visits 24    Date for PT Re-Evaluation 06/07/22    Authorization Type [employer] MEDICARE reporting period from 01/23/2022    Progress Note Due on Visit 20    PT Start Time 1600    PT Stop Time 1640    PT Time Calculation (min) 40 min    Activity Tolerance Patient tolerated treatment well    Behavior During Therapy [facility] for tasks assessed/performed              Past Medical History:  Diagnosis Date   Actinic keratosis    Asthma    Eczema    Hx of dysplastic nevus 06/06/2006   Mid lower back, slight atypia   Hypothyroidism    Seasonal allergies    Thyroid disease    Past Surgical History:  Procedure Laterality Date   CATARACT EXTRACTION     COLON SURGERY     COLONOSCOPY WITH PROPOFOL N/A 11/18/2014   Procedure: COLONOSCOPY WITH PROPOFOL;  Surgeon: [name], MD;  Location: [facility] ENDOSCOPY;  Service: Endoscopy;  Laterality: N/A;   COLONOSCOPY WITH PROPOFOL N/A 12/13/2021   Procedure: COLONOSCOPY WITH PROPOFOL;  Surgeon: [name], MD;  Location: [facility] ENDOSCOPY;  Service: Endoscopy;  Laterality: N/A;   EYE SURGERY     KNEE SURGERY     NASAL SEPTUM SURGERY     Patient Active Problem List   Diagnosis Date Noted   Paroxysmal atrial fibrillation ([place]) 03/28/2022   Idiopathic hypotension 03/28/2022   Spondylolysis, lumbar region 03/28/2022   Avitaminosis D 03/28/2022   Myalgia due to statin 03/28/2022   Acquired thrombophilia ([place]) 03/28/2022   Hyperlipidemia 03/28/2022   Abnormal gait 03/28/2022   Mild intermittent asthma without complication 75/64/3329   Zenker's (hypopharyngeal) diverticulum  03/22/2018   Gastroesophageal reflux disease 03/22/2018   Fatty liver 10/09/2017   BPH (benign prostatic hyperplasia) 11/21/2014   Hypothyroidism 11/21/2014   Inguinal hernia 11/21/2014   OA (osteoarthritis) 11/21/2014   H/O adenomatous polyp of colon 10/14/2014    REFERRING DIAG: lumbar DDD (degenerative disc disease), lumbar stenosis with neurogenic claudication, lumbar radiculitis  THERAPY DIAG:  Other low back pain  Pain in thoracic spine  Abnormal posture  Difficulty in walking, not elsewhere classified  Rationale for Evaluation and Treatment: Rehabilitation  PERTINENT HISTORY: Patient is a 71 y.o. male who presents to outpatient physical therapy with a referral for medical diagnosis lumbar DDD (degenerative disc disease, lumbar stenosis with neurogenic claudication, lumbar radiculitis. This patient's chief complaints consist of chronic pain in the low back and upper glutes that moves up towards his B scapulae with prolonged standing/walking and with intermittent shooting down his R left to the knee, leading to the following functional deficits: difficulty with anything that requires a lot of standing/walking, cooking for church/wine club, cleaning the house, waiting in line, standing longer than 5-20 min, having conversations with others while standing, yard work, Orthoptist, cleaning, sit <> stand, bending and lifting together, staining deck, shopping, walking more than 2 blocks, social participation, changing positions in the bed, golfing.    Relevant past medical history and comorbidities include asthma,  Zenker's (hypopharyngeal) diverticulum, afib, GERD, hx of fatty liver, hypothyroidism, BPH, OA, hx adeomatous polyp of colon, inguinal hernia, L knee pain, hx of dysplastic nevus, colon surgery (7 inches removed of colon), cataract extraction, L knee pain.  Patient denies hx of cancer, stroke, seizures, lung problem, diabetes, unexplained weight loss, unexplained changes  in bowel or bladder problems, unexplained stumbling or dropping things, spinal surgery. He has been exercising daily.     PRECAUTIONS: none  SUBJECTIVE: Patient reports his back is a little stiff today. He had a good time in [place] and took prednisone while he was there to help with his back pain while being up and walking around more. He states he saw his new PCP (since Dr. [name] moved to another clinic) and expressed is concern about having early stages of Parkinson's. She referred him to Dr. Manuella Ghazi the neurologist. He has noticed a lot of things that point him towards Parkinson's, the one he feels is most concerning is macrographia. He feels it might be contributing to his back pain because it is so hard for him to maintain upright posture. He had a second injection with Dr. Sharlet Salina since last PT session. It worked for most of the day, so he is now scheduled for an ablation in mid November.   PAIN:  Are you having pain? NPRS 0/10  in low back.   OBJECTIVE (measurements last taken 03/15/2022);   SELF-REPORTED FUNCTION FOTO score: 65/100 (lumbar spine questionnaire)   FUNCTIONAL/BALANCE TEST 6 Minute Walk Test: 1347 feet with no AD, stiffening at about  3 min. Pain at right scapula up to 4-5/10. R knee pain.    - floor to waist lift with box 10-25# with 5# increase each rep, then 1x10 reps at 25#. Reported pain with each lift at low back increasing to 5/10 at 25#. Incomplete stand and occasionally missing touching floor.     TODAY'S TREATMENT  Therapeutic exercise: to centralize symptoms and improve ROM, strength, muscular endurance, and activity tolerance required for successful completion of functional activities. - Lat pull downs: 3x10, 35# - Seated scapular row at [facility] machine: 3x10 at 35#.  (Manual therapy - see below)  - prone press up, 3x10 - prone scapular retraction with upper trunk extension hands at sides of face, 3x10 (difficult) - standing bilateral pec stretch in  doorway, 3x30 seconds.  - STS from 18 inch chair (Green) with green theraball overhead raises to tap green theraball stand overhead. 3x8. Difficult.  - standing upper trunk extension off wall levering off of head pressing into small green ball on wall. 3x10.  - standing underhand tosses on wall over head with small green ball to improve postural strength and endurance, 3x20.   Manual therapy: to reduce pain and tissue tension, improve range of motion, neuromodulation, in order to promote improved ability to complete functional activities. PRONE with face in table face cradle - CPA grade III-IV thoracic spine T2 through L5. ~ 15-40 CPA per segment to improve mobility.  Pt required multimodal cuing for proper technique and to facilitate improved neuromuscular control, strength, range of motion, and functional ability resulting in improved performance and form.   PATIENT EDUCATION:  Education details: Exercise purpose/form. Self management techniques.  Reviewed cancelation/no-show policy with patient and confirmed patient has correct phone number for clinic; patient verbalized understanding (01/18/22). Person educated: Patient Education method: Explanation, demonstration, VC, TC Education comprehension: verbalized understanding, demonstrated understanding, and needs further education     HOME EXERCISE PROGRAM: Access Code: [email or web address] URL:  [email or web address] Date: 02/08/2022 Prepared by: Rosita Kea  Exercises - Sidelying Thoracic Rotation with Open Book  - 1-2 x daily - 3 sets - 10 reps - 5 seconds hold - Bridge  - 1-2 x daily - 3 sets - 10 reps - 5 seconds hold - Supine Lower Trunk Rotation  - 1 x daily - 3 sets - 15 reps - Prone Press Up  - 1-2 x daily - 3 sets - 10 reps - Bilateral Bent Leg Lift  - 1-2 x daily - 3 sets - 10 reps - Seated Thoracic Lumbar Extension  - Seated Flexion Stretch with Swiss Ball  - 1 x daily - 1 sets - 20 reps - 5 seconds hold - Baby Cobra  Hands Hovering   - 1 x daily - 3 sets - 10 reps - 5 seconds hold   ASSESSMENT:   CLINICAL IMPRESSION: Patient has attended 20 physical therapy sessions since starting the current episode of care on 12/20/2021. Patient has shown improvements in FOTO score (lumbar), floor to waist lift, 6 Minute Walk Test, and HEP participation. He has variable report of standing tolerance, up to 60 min last progress note and at least 15 min last time he was asked on 03/15/2022. Measurements were taken 2 visits ago and not repeated today.  Patient continues to have difficulty with activities requiring standing and upright postures such as cooking, yardwork, and walking. He has not yet reached his max potential improvement and would benefit from continued PT. Patient continues to struggle with maintaining upright posture. He has upcoming procedures to ablate nerves in the low back and is planning to see a neurologist about his concerns regarding Parkinson's. Plan to continue working on improved postural ROM and strength to improve quality of life and function. Patient may have enhanced improvement in PT and better ability to work towards postural strength goals following upcoming nerve ablation. Patient would benefit from continued management of limiting condition by skilled physical therapist to address remaining impairments and functional limitations to work towards stated goals and return to PLOF or maximal functional independence.    From PT eval 12/20/2021:  Patient is a 71 y.o. male referred to outpatient physical therapy with a medical diagnosis of lumbar DDD (degenerative disc disease, lumbar stenosis with neurogenic claudication, lumbar radiculitis who presents with signs and symptoms consistent with chronic low back and thoracic spine pain and stiffness with chronic kyphotic postural changes, hip extension weakness, and intermittent radicular symptoms contributing. Patient's pattern of symptoms suggests sustained spinal  extension intolerance and difficulty with postural endurance is likely contributing to condition. Previous episode of care was able to improve patient's quality of life some with decreased pain and improved function and patient has potential for improvement from his current state in these areas. Patient presents with significant pain, joint stiffness, posture, ROM, motor control, muscle performance (strength/power/endurance), and activity tolerance impairments that are limiting ability to complete his usual activities including anything that requires a lot of standing/walking, cooking for church/wine club, cleaning the house, waiting in line, standing longer than 5-20 min, having conversations with others while standing, yard work, Orthoptist, cleaning, sit <> stand, bending and lifting together, staining deck, shopping, walking more than 2 blocks, social participation, changing positions in the bed, golfing without difficulty. Patient will benefit from skilled physical therapy intervention to address current body structure impairments and activity limitations to improve function and work towards goals set in current POC in order to return to prior level of function  or maximal functional improvement.      OBJECTIVE IMPAIRMENTS Abnormal gait, decreased activity tolerance, decreased endurance, decreased mobility, difficulty walking, decreased ROM, decreased strength, hypomobility, impaired perceived functional ability, increased muscle spasms, impaired flexibility, improper body mechanics, postural dysfunction, and pain.    ACTIVITY LIMITATIONS carrying, lifting, bending, standing, bed mobility, dressing, and locomotion level   PARTICIPATION LIMITATIONS: meal prep, cleaning, laundry, interpersonal relationship, shopping, community activity, yard work, church, and   asthma, Zenker's (hypopharyngeal) diverticulum, afib, GERD, hx of fatty liver, hypothyroidism, BPH, OA, hx adeomatous polyp of colon,  inguinal hernia, L knee pain, hx of dysplastic nevus, colon surgery (7 inches removed of colon), cataract extraction, L knee pain   PERSONAL FACTORS Age, Past/current experiences, Time since onset of injury/illness/exacerbation, and 3+ comorbidities:   asthma, Zenker's (hypopharyngeal) diverticulum, afib, GERD, hx of fatty liver, hypothyroidism, BPH, OA, hx adeomatous polyp of colon, inguinal hernia, L knee pain, hx of dysplastic nevus, colon surgery (7 inches removed of colon), cataract extraction, L knee pain are also affecting patient's functional outcome.    REHAB POTENTIAL: Good   CLINICAL DECISION MAKING: Stable/uncomplicated   EVALUATION COMPLEXITY: Low     GOALS: Goals reviewed with patient? yes   SHORT TERM GOALS: Target date: 01/03/2022   Patient will be independent with initial home exercise program for self-management of symptoms. Baseline: Initial HEP to be provided at visit 2 as appropriate (12/20/21); initial HEP provided visit 2 (12/26/2021);  Goal status: met    LONG TERM GOALS: Target date: 03/14/2022. UPDATED to 06/07/2022 for all unmet goals on 03/15/2022.    Patient will be independent with a long-term home exercise program for self-management of symptoms.  Baseline: Initial HEP to be provided at visit 2 as appropriate (12/20/21); initial HEP provided visit 2 (12/26/2021); participating in HEP (01/23/2022); currently participating (03/15/2022);   Goal status: In-progress   2.  Patient will demonstrate improved FOTO to equal or greater than 67 by visit #11 to demonstrate improvement in overall condition and self-reported functional ability.  Baseline: 54 (12/20/21); 62 at visit # 10 (01/23/2022); 65 at visit #18 (03/15/2022); Goal status: In-progress   3.  Patient will ambulate equal or greater than 1200 feet on 6 Minute Walk Test with no increase in symptoms to demonstrate improved community ambulation.  Baseline: to be tested visit 2 as appropriate (12/20/21); 1,225' up  to 2/10 NPS (12/26/2021); 1263 feet with no AD, no complaint of pain while walking. Pain at waist when going to sit after walking. R knee pain (01/23/2022); 1347 feet with no AD, stiffening at about  3 min. Pain at right scapula up to 4-5/10. R knee pain (03/15/2022);  Goal status: Met 01/23/2022   4.  Patient will demonstrate the ability to lift 25# box floor to waist 1x10 times in a row with no increase in symptoms to improve his ability to bend and lift things around his home.  Baseline: reports difficulty with bending and lifting items at home  (12/20/21); 3 reps at 25#. Reported pain with each lift at low back increasing to 4/10 at 25# with request to stop (01/23/2022); 1x10 reps at 25#. Reported pain with each lift at low back increasing to 5/10 at 25#. Incomplete stand and occasionally missing touching floor (03/15/2022);  Goal status: In-progress   5.  Patient will report the ability to stand/walk equal or greater than 60 min before needing to sit to improve his ability to stand in line, socialize, and cook.  Baseline: 5-20 min (12/20/21); on his  feet 60 min with salon pass on his back making hashbrown casserole this morning before he started to get pain, he feels he would not be able to do this without the salon pass (01/23/2022): Has not tested it recently, there is a party coming up where he will be cooking in November (03/15/2022);  Goal status: Nearly met.      PLAN: PT FREQUENCY: 1-2x/week   PT DURATION: 12 weeks   PLANNED INTERVENTIONS: Therapeutic exercises, Therapeutic activity, Neuromuscular re-education, Patient/Family education, Joint mobilization, DME instructions, Dry Needling, Electrical stimulation, Spinal mobilization, Cryotherapy, Moist heat, Manual therapy, and Re-evaluation.   PLAN FOR NEXT SESSION: update HEP as appropriate, manual therapy and exercises for improved joint mobility in the spine.  Everlean Alstrom. Graylon Good, PT, DPT 03/29/22, 8:43 PM  [facility] [address] [place], [place] [address] P: [phone] I F: [phone]

## 2022-03-31 ENCOUNTER — Encounter: Payer: Self-pay | Admitting: Family Medicine

## 2022-03-31 NOTE — Telephone Encounter (Signed)
Daniel [name] The referral was sent to Dr Trena Platt office on 03/28/22. Please reach out to their office to schedule appointment at [phone]

## 2022-04-05 ENCOUNTER — Encounter: Payer: Medicare Other | Admitting: Physical Therapy

## 2022-04-10 ENCOUNTER — Ambulatory Visit: Payer: Medicare Other | Admitting: Physical Therapy

## 2022-04-10 NOTE — Therapy (Signed)
[facility] [address] [place], [address] Phone: [phone]   Fax:  [phone]  Patient Details  Name: Daniel [name] MRN: [id number] Date of Birth: 12-08-1950 Referring Provider:  Harvest Dark, FNP  Encounter Date: 04/10/2022   Patient had left with PT went to get him from waiting room at 7:37pm (for 7:30pm appointment). PT called patient and he said he had to leave to go home, change clothes, and get to a meeting at church that was called earlier in the day. He states he didn't cancel PT earlier because the thought he would have time, but he left when PT didn't start on time. He states he knows PT isn't always able to start on time. He confirmed his next appointment scheduled for 5:30pm on Wednesday 04/12/2022. PT apologized for running behind.   Everlean Alstrom. Graylon Good, PT, DPT 04/10/22, 5:46 PM  [facility] [address] [place], [address] Phone: [phone]   Fax:  [phone]

## 2022-04-12 ENCOUNTER — Encounter: Payer: Self-pay | Admitting: Physical Therapy

## 2022-04-12 ENCOUNTER — Ambulatory Visit: Payer: Medicare Other | Admitting: Physical Therapy

## 2022-04-12 DIAGNOSIS — R293 Abnormal posture: Secondary | ICD-10-CM

## 2022-04-12 DIAGNOSIS — M546 Pain in thoracic spine: Secondary | ICD-10-CM

## 2022-04-12 DIAGNOSIS — M5459 Other low back pain: Secondary | ICD-10-CM

## 2022-04-12 DIAGNOSIS — R262 Difficulty in walking, not elsewhere classified: Secondary | ICD-10-CM

## 2022-04-12 NOTE — Therapy (Signed)
OUTPATIENT PHYSICAL THERAPY TREATMENT NOTE   Patient Name: Daniel [name] MRN: [id number] DOB:10-15-1950, 71 y.o., male Today's Date: 04/12/2022  PCP: Daniel [name]., MD REFERRING PROVIDER: Allene Dillon, NP  END OF SESSION:   PT End of Session - 04/12/22 1605     Visit Number 21    Number of Visits 24    Date for PT Re-Evaluation 06/07/22    Authorization Type [employer] MEDICARE reporting period from 03/29/2022    Progress Note Due on Visit 20    PT Start Time 1600    PT Stop Time 1640    PT Time Calculation (min) 40 min    Activity Tolerance Patient tolerated treatment well    Behavior During Therapy [name] for tasks assessed/performed               Past Medical History:  Diagnosis Date   Actinic keratosis    Asthma    Eczema    Hx of dysplastic nevus 06/06/2006   Mid lower back, slight atypia   Hypothyroidism    Seasonal allergies    Thyroid disease    Past Surgical History:  Procedure Laterality Date   CATARACT EXTRACTION     COLON SURGERY     COLONOSCOPY WITH PROPOFOL N/A 11/18/2014   Procedure: COLONOSCOPY WITH PROPOFOL;  Surgeon: Daniel Silvas, MD;  Location: [facility] ENDOSCOPY;  Service: Endoscopy;  Laterality: N/A;   COLONOSCOPY WITH PROPOFOL N/A 12/13/2021   Procedure: COLONOSCOPY WITH PROPOFOL;  Surgeon: Daniel Rubenstein, MD;  Location: [facility] ENDOSCOPY;  Service: Endoscopy;  Laterality: N/A;   EYE SURGERY     KNEE SURGERY     NASAL SEPTUM SURGERY     Patient Active Problem List   Diagnosis Date Noted   Paroxysmal atrial fibrillation ([place]) 03/28/2022   Idiopathic hypotension 03/28/2022   Spondylolysis, lumbar region 03/28/2022   Avitaminosis D 03/28/2022   Myalgia due to statin 03/28/2022   Acquired thrombophilia ([place]) 03/28/2022   Hyperlipidemia 03/28/2022   Abnormal gait 03/28/2022   Mild intermittent asthma without complication 62/13/0865   Zenker's (hypopharyngeal) diverticulum 03/22/2018   Gastroesophageal reflux disease 03/22/2018    Fatty liver 10/09/2017   BPH (benign prostatic hyperplasia) 11/21/2014   Hypothyroidism 11/21/2014   Inguinal hernia 11/21/2014   OA (osteoarthritis) 11/21/2014   H/O adenomatous polyp of colon 10/14/2014    REFERRING DIAG: lumbar DDD (degenerative disc disease), lumbar stenosis with neurogenic claudication, lumbar radiculitis  THERAPY DIAG:  Other low back pain  Pain in thoracic spine  Abnormal posture  Difficulty in walking, not elsewhere classified  Rationale for Evaluation and Treatment: Rehabilitation  PERTINENT HISTORY: Patient is a 72 y.o. male who presents to outpatient physical therapy with a referral for medical diagnosis lumbar DDD (degenerative disc disease, lumbar stenosis with neurogenic claudication, lumbar radiculitis. This patient's chief complaints consist of chronic pain in the low back and upper glutes that moves up towards his B scapulae with prolonged standing/walking and with intermittent shooting down his R left to the knee, leading to the following functional deficits: difficulty with anything that requires a lot of standing/walking, cooking for church/wine club, cleaning the house, waiting in line, standing longer than 5-20 min, having conversations with others while standing, yard work, Orthoptist, cleaning, sit <> stand, bending and lifting together, staining deck, shopping, walking more than 2 blocks, social participation, changing positions in the bed, golfing.    Relevant past medical history and comorbidities include asthma, Zenker's (hypopharyngeal) diverticulum, afib, GERD, hx of fatty  liver, hypothyroidism, BPH, OA, hx adeomatous polyp of colon, inguinal hernia, L knee pain, hx of dysplastic nevus, colon surgery (7 inches removed of colon), cataract extraction, L knee pain.  Patient denies hx of cancer, stroke, seizures, lung problem, diabetes, unexplained weight loss, unexplained changes in bowel or bladder problems, unexplained stumbling or  dropping things, spinal surgery. He has been exercising daily.     PRECAUTIONS: none  SUBJECTIVE: Patient reports his back is bothering him more today. He felt okay after last PT session. He states his back felt better than it has felt in weeks on Saturday until evening. He does not know why. Yesterday he swept leaves and blew leaves out of the garage while wearing his back belt and it did not bother him much. He awoke with the increased pain and thinks it could be from how he slept or maybe the work yesterday. He is supposed to have a nerve ablation tomorrow as long as his insurance approves it.   PAIN:  Are you having pain? NPRS 4-5/10  in low back.   OBJECTIVE        TODAY'S TREATMENT  Therapeutic exercise: to centralize symptoms and improve ROM, strength, muscular endurance, and activity tolerance required for successful completion of functional activities. - Lat pull downs: 3x10, 35# - Seated scapular row at [facility] machine: 3x10 at 35#. Cuing for upright posture.   (Manual therapy - see below)  - prone press up, 3x10 - prone scapular retraction with upper trunk extension hands at sides of face, 3x10 (difficult).cuing for correct performance.  - standing bilateral pec stretch in doorway, 3x30 seconds. Cuing for correct positioning and non-painful foot forwards.  - STS from 18 inch chair (Green) with green theraball overhead raises to tap green theraball stand overhead. 2x8. Difficult.   Manual therapy: to reduce pain and tissue tension, improve range of motion, neuromodulation, in order to promote improved ability to complete functional activities. PRONE with face in table face cradle - CPA grade III-IV thoracic spine T2 through L5. ~ 15-40 CPA and 5-10 alternating UPA per segment to improve mobility. - STM to lumbar and lower thoracic paraspinals.   Pt required multimodal cuing for proper technique and to facilitate improved neuromuscular control, strength, range of motion, and  functional ability resulting in improved performance and form.   PATIENT EDUCATION:  Education details: Exercise purpose/form. Self management techniques.  Reviewed cancelation/no-show policy with patient and confirmed patient has correct phone number for clinic; patient verbalized understanding (01/18/22). Person educated: Patient Education method: Explanation, demonstration, VC, TC Education comprehension: verbalized understanding, demonstrated understanding, and needs further education     HOME EXERCISE PROGRAM: Access Code: [email or web address] URL: [email or web address] Date: 02/08/2022 Prepared by: Rosita Kea  Exercises - Sidelying Thoracic Rotation with Open Book  - 1-2 x daily - 3 sets - 10 reps - 5 seconds hold - Bridge  - 1-2 x daily - 3 sets - 10 reps - 5 seconds hold - Supine Lower Trunk Rotation  - 1 x daily - 3 sets - 15 reps - Prone Press Up  - 1-2 x daily - 3 sets - 10 reps - Bilateral Bent Leg Lift  - 1-2 x daily - 3 sets - 10 reps - Seated Thoracic Lumbar Extension  - Seated Flexion Stretch with Swiss Ball  - 1 x daily - 1 sets - 20 reps - 5 seconds hold - Baby Cobra Hands Hovering   - 1 x daily - 3 sets - 10 reps - 5 seconds  hold   ASSESSMENT:   CLINICAL IMPRESSION: Patient arrives with elevated low back pain today and continued difficulty with upright posture. He continues to report decreased pain in response to manual therapy. Also continued with strengthening exercises for posture and function. Patient scheduled to have nerve ablation in his back tomorrow. Plan to continue with strengthening and interventions for pain control as needed. Patient continues to require cuing for optimal ROM and motor control during PT sessions. Patient would benefit from continued management of limiting condition by skilled physical therapist to address remaining impairments and functional limitations to work towards stated goals and return to PLOF or maximal functional independence.     From PT eval 12/20/2021:  Patient is a 71 y.o. male referred to outpatient physical therapy with a medical diagnosis of lumbar DDD (degenerative disc disease, lumbar stenosis with neurogenic claudication, lumbar radiculitis who presents with signs and symptoms consistent with chronic low back and thoracic spine pain and stiffness with chronic kyphotic postural changes, hip extension weakness, and intermittent radicular symptoms contributing. Patient's pattern of symptoms suggests sustained spinal extension intolerance and difficulty with postural endurance is likely contributing to condition. Previous episode of care was able to improve patient's quality of life some with decreased pain and improved function and patient has potential for improvement from his current state in these areas. Patient presents with significant pain, joint stiffness, posture, ROM, motor control, muscle performance (strength/power/endurance), and activity tolerance impairments that are limiting ability to complete his usual activities including anything that requires a lot of standing/walking, cooking for church/wine club, cleaning the house, waiting in line, standing longer than 5-20 min, having conversations with others while standing, yard work, Orthoptist, cleaning, sit <> stand, bending and lifting together, staining deck, shopping, walking more than 2 blocks, social participation, changing positions in the bed, golfing without difficulty. Patient will benefit from skilled physical therapy intervention to address current body structure impairments and activity limitations to improve function and work towards goals set in current POC in order to return to prior level of function or maximal functional improvement.      OBJECTIVE IMPAIRMENTS Abnormal gait, decreased activity tolerance, decreased endurance, decreased mobility, difficulty walking, decreased ROM, decreased strength, hypomobility, impaired perceived  functional ability, increased muscle spasms, impaired flexibility, improper body mechanics, postural dysfunction, and pain.    ACTIVITY LIMITATIONS carrying, lifting, bending, standing, bed mobility, dressing, and locomotion level   PARTICIPATION LIMITATIONS: meal prep, cleaning, laundry, interpersonal relationship, shopping, community activity, yard work, church, and   asthma, Zenker's (hypopharyngeal) diverticulum, afib, GERD, hx of fatty liver, hypothyroidism, BPH, OA, hx adeomatous polyp of colon, inguinal hernia, L knee pain, hx of dysplastic nevus, colon surgery (7 inches removed of colon), cataract extraction, L knee pain   PERSONAL FACTORS Age, Past/current experiences, Time since onset of injury/illness/exacerbation, and 3+ comorbidities:   asthma, Zenker's (hypopharyngeal) diverticulum, afib, GERD, hx of fatty liver, hypothyroidism, BPH, OA, hx adeomatous polyp of colon, inguinal hernia, L knee pain, hx of dysplastic nevus, colon surgery (7 inches removed of colon), cataract extraction, L knee pain are also affecting patient's functional outcome.    REHAB POTENTIAL: Good   CLINICAL DECISION MAKING: Stable/uncomplicated   EVALUATION COMPLEXITY: Low     GOALS: Goals reviewed with patient? yes   SHORT TERM GOALS: Target date: 01/03/2022   Patient will be independent with initial home exercise program for self-management of symptoms. Baseline: Initial HEP to be provided at visit 2 as appropriate (12/20/21); initial HEP provided visit 2 (12/26/2021);  Goal status: met    LONG TERM GOALS: Target date: 03/14/2022. UPDATED to 06/07/2022 for all unmet goals on 03/15/2022.    Patient will be independent with a long-term home exercise program for self-management of symptoms.  Baseline: Initial HEP to be provided at visit 2 as appropriate (12/20/21); initial HEP provided visit 2 (12/26/2021); participating in HEP (01/23/2022); currently participating (03/15/2022);   Goal status: In-progress    2.  Patient will demonstrate improved FOTO to equal or greater than 67 by visit #11 to demonstrate improvement in overall condition and self-reported functional ability.  Baseline: 54 (12/20/21); 62 at visit # 10 (01/23/2022); 65 at visit #18 (03/15/2022); Goal status: In-progress   3.  Patient will ambulate equal or greater than 1200 feet on 6 Minute Walk Test with no increase in symptoms to demonstrate improved community ambulation.  Baseline: to be tested visit 2 as appropriate (12/20/21); 1,225' up to 2/10 NPS (12/26/2021); 1263 feet with no AD, no complaint of pain while walking. Pain at waist when going to sit after walking. R knee pain (01/23/2022); 1347 feet with no AD, stiffening at about  3 min. Pain at right scapula up to 4-5/10. R knee pain (03/15/2022);  Goal status: Met 01/23/2022   4.  Patient will demonstrate the ability to lift 25# box floor to waist 1x10 times in a row with no increase in symptoms to improve his ability to bend and lift things around his home.  Baseline: reports difficulty with bending and lifting items at home  (12/20/21); 3 reps at 25#. Reported pain with each lift at low back increasing to 4/10 at 25# with request to stop (01/23/2022); 1x10 reps at 25#. Reported pain with each lift at low back increasing to 5/10 at 25#. Incomplete stand and occasionally missing touching floor (03/15/2022);  Goal status: In-progress   5.  Patient will report the ability to stand/walk equal or greater than 60 min before needing to sit to improve his ability to stand in line, socialize, and cook.  Baseline: 5-20 min (12/20/21); on his feet 60 min with salon pass on his back making hashbrown casserole this morning before he started to get pain, he feels he would not be able to do this without the salon pass (01/23/2022): Has not tested it recently, there is a party coming up where he will be cooking in November (03/15/2022);  Goal status: Nearly met.      PLAN: PT FREQUENCY:  1-2x/week   PT DURATION: 12 weeks   PLANNED INTERVENTIONS: Therapeutic exercises, Therapeutic activity, Neuromuscular re-education, Patient/Family education, Joint mobilization, DME instructions, Dry Needling, Electrical stimulation, Spinal mobilization, Cryotherapy, Moist heat, Manual therapy, and Re-evaluation.   PLAN FOR NEXT SESSION: update HEP as appropriate, manual therapy and exercises for improved joint mobility in the spine.  Everlean Alstrom. Graylon Good, PT, DPT 04/12/22, 4:40 PM  [facility] [address] [place], [place] [address] P: [phone] I F: [phone]

## 2022-04-13 DIAGNOSIS — M47816 Spondylosis without myelopathy or radiculopathy, lumbar region: Secondary | ICD-10-CM | POA: Diagnosis not present

## 2022-04-17 ENCOUNTER — Ambulatory Visit: Payer: Medicare Other | Admitting: Physical Therapy

## 2022-04-19 ENCOUNTER — Ambulatory Visit: Payer: Medicare Other | Admitting: Physical Therapy

## 2022-04-25 ENCOUNTER — Ambulatory Visit: Payer: Medicare Other | Admitting: Family Medicine

## 2022-04-26 ENCOUNTER — Ambulatory Visit: Payer: Medicare Other | Admitting: Physical Therapy

## 2022-04-26 ENCOUNTER — Encounter: Payer: Self-pay | Admitting: Physical Therapy

## 2022-04-26 DIAGNOSIS — R262 Difficulty in walking, not elsewhere classified: Secondary | ICD-10-CM | POA: Diagnosis not present

## 2022-04-26 DIAGNOSIS — M546 Pain in thoracic spine: Secondary | ICD-10-CM | POA: Diagnosis not present

## 2022-04-26 DIAGNOSIS — M5459 Other low back pain: Secondary | ICD-10-CM

## 2022-04-26 DIAGNOSIS — R293 Abnormal posture: Secondary | ICD-10-CM

## 2022-04-26 NOTE — Therapy (Signed)
OUTPATIENT PHYSICAL THERAPY TREATMENT NOTE   Patient Name: Daniel [name] MRN: [id number] DOB:[birth year], 71 y.o., male Today's Date: 04/26/2022  PCP: Jerrol Banana., MD REFERRING PROVIDER: Allene Dillon, NP  END OF SESSION:   PT End of Session - 04/26/22 1654     Visit Number 22    Number of Visits 24    Date for PT Re-Evaluation 06/07/22    Authorization Type [employer] MEDICARE reporting period from 03/29/2022    Progress Note Due on Visit 20    PT Start Time 1649    PT Stop Time 1710    PT Time Calculation (min) 21 min    Activity Tolerance Patient tolerated treatment well    Behavior During Therapy [facility] for tasks assessed/performed                Past Medical History:  Diagnosis Date   Actinic keratosis    Asthma    Eczema    Hx of dysplastic nevus 06/06/2006   Mid lower back, slight atypia   Hypothyroidism    Seasonal allergies    Thyroid disease    Past Surgical History:  Procedure Laterality Date   CATARACT EXTRACTION     COLON SURGERY     COLONOSCOPY WITH PROPOFOL N/A 11/18/2014   Procedure: COLONOSCOPY WITH PROPOFOL;  Surgeon: [name], MD;  Location: [facility] ENDOSCOPY;  Service: Endoscopy;  Laterality: N/A;   COLONOSCOPY WITH PROPOFOL N/A 12/13/2021   Procedure: COLONOSCOPY WITH PROPOFOL;  Surgeon: [name], MD;  Location: [facility] ENDOSCOPY;  Service: Endoscopy;  Laterality: N/A;   EYE SURGERY     KNEE SURGERY     NASAL SEPTUM SURGERY     Patient Active Problem List   Diagnosis Date Noted   Paroxysmal atrial fibrillation ([place]) 03/28/2022   Idiopathic hypotension 03/28/2022   Spondylolysis, lumbar region 03/28/2022   Avitaminosis D 03/28/2022   Myalgia due to statin 03/28/2022   Acquired thrombophilia ([place]) 03/28/2022   Hyperlipidemia 03/28/2022   Abnormal gait 03/28/2022   Mild intermittent asthma without complication 52/84/1324   Zenker's (hypopharyngeal) diverticulum 03/22/2018   Gastroesophageal reflux disease 03/22/2018    Fatty liver 10/09/2017   BPH (benign prostatic hyperplasia) 11/21/2014   Hypothyroidism 11/21/2014   Inguinal hernia 11/21/2014   OA (osteoarthritis) 11/21/2014   H/O adenomatous polyp of colon 10/14/2014    REFERRING DIAG: lumbar DDD (degenerative disc disease), lumbar stenosis with neurogenic claudication, lumbar radiculitis  THERAPY DIAG:  Other low back pain  Pain in thoracic spine  Abnormal posture  Difficulty in walking, not elsewhere classified  Rationale for Evaluation and Treatment: Rehabilitation  PERTINENT HISTORY: Patient is a 71 y.o. male who presents to outpatient physical therapy with a referral for medical diagnosis lumbar DDD (degenerative disc disease, lumbar stenosis with neurogenic claudication, lumbar radiculitis. This patient's chief complaints consist of chronic pain in the low back and upper glutes that moves up towards his B scapulae with prolonged standing/walking and with intermittent shooting down his R left to the knee, leading to the following functional deficits: difficulty with anything that requires a lot of standing/walking, cooking for church/wine club, cleaning the house, waiting in line, standing longer than 5-20 min, having conversations with others while standing, yard work, Orthoptist, cleaning, sit <> stand, bending and lifting together, staining deck, shopping, walking more than 2 blocks, social participation, changing positions in the bed, golfing.    Relevant past medical history and comorbidities include asthma, Zenker's (hypopharyngeal) diverticulum, afib, GERD, hx of  fatty liver, hypothyroidism, BPH, OA, hx adeomatous polyp of colon, inguinal hernia, L knee pain, hx of dysplastic nevus, colon surgery (7 inches removed of colon), cataract extraction, L knee pain.  Patient denies hx of cancer, stroke, seizures, lung problem, diabetes, unexplained weight loss, unexplained changes in bowel or bladder problems, unexplained stumbling or  dropping things, spinal surgery. He has been exercising daily.     PRECAUTIONS: none  SUBJECTIVE: Patient reports he got his nerve ablated almost 2 weeks ago. His back felt a lot better for a few days, then it was not quite as helpful. Sunday after Thanksgiving he spent a lot of time on his feet helping show people where things were to decorate at the church resulting in feeling like he could barely move on Monday with a lot of pain. However, today he has no pain and does not feel stiff either. He needs to leave by 5:10pm to get to a meeting at church this evening. He states he is hoping to improve his postural strength since getting the ablation since it will be easier to complete strengthening exercises with less pain.   PAIN:  Are you having pain? NPRS 0/10 and no pain   OBJECTIVE     TODAY'S TREATMENT  Therapeutic exercise: to centralize symptoms and improve ROM, strength, muscular endurance, and activity tolerance required for successful completion of functional activities. - Lat pull downs: 3x10, 35# - Seated scapular row at [facility] machine: 3x10 at 35#. Cuing for upright posture.  (Manual therapy - see below) - prone press up, 3x10 - prone scapular retraction with upper trunk extension hands at sides of face, 3x10. cuing for improved ROM and to use upper body more than feet.  - STS from 18 inch chair (Green) with green theraball overhead raises to tap green theraball stand overhead. 1x10.   Manual therapy: to reduce pain and tissue tension, improve range of motion, neuromodulation, in order to promote improved ability to complete functional activities. PRONE with face in table face cradle - CPA grade III-IV thoracic spine T2 through L5. ~ 10-30 CPA - brief STM to lumbar and lower thoracic paraspinals.   Pt required multimodal cuing for proper technique and to facilitate improved neuromuscular control, strength, range of motion, and functional ability resulting in improved performance  and form.   PATIENT EDUCATION:  Education details: Exercise purpose/form. Self management techniques.  Reviewed cancelation/no-show policy with patient and confirmed patient has correct phone number for clinic; patient verbalized understanding (01/18/22). Person educated: Patient Education method: Explanation, demonstration, VC, TC Education comprehension: verbalized understanding, demonstrated understanding, and needs further education     HOME EXERCISE PROGRAM: Access Code: [email or web address] URL: [email or web address] Date: 02/08/2022 Prepared by: Rosita Kea  Exercises - Sidelying Thoracic Rotation with Open Book  - 1-2 x daily - 3 sets - 10 reps - 5 seconds hold - Bridge  - 1-2 x daily - 3 sets - 10 reps - 5 seconds hold - Supine Lower Trunk Rotation  - 1 x daily - 3 sets - 15 reps - Prone Press Up  - 1-2 x daily - 3 sets - 10 reps - Bilateral Bent Leg Lift  - 1-2 x daily - 3 sets - 10 reps - Seated Thoracic Lumbar Extension  - Seated Flexion Stretch with Swiss Ball  - 1 x daily - 1 sets - 20 reps - 5 seconds hold - Baby Cobra Hands Hovering   - 1 x daily - 3 sets - 10 reps - 5 seconds hold  ASSESSMENT:   CLINICAL IMPRESSION: Patient arrives with no low back pain today, almost 2 weeks after nerve ablation procedure. He demonstrated more upright posture per observation but continues to struggle with maintaining good posture and completing postural exercises due to stiffness and limited postural strength and endurance. Session was shortened by patient's need to leave 20 min early. Plan to continue with postural strengthening and endurance exercises and ROM interventions as appropriate. Patient continues to require PT cuing for form, ROM, completion of exercises. Patient would benefit from continued management of limiting condition by skilled physical therapist to address remaining impairments and functional limitations to work towards stated goals and return to PLOF or maximal  functional independence.    From PT eval 12/20/2021:  Patient is a 71 y.o. male referred to outpatient physical therapy with a medical diagnosis of lumbar DDD (degenerative disc disease, lumbar stenosis with neurogenic claudication, lumbar radiculitis who presents with signs and symptoms consistent with chronic low back and thoracic spine pain and stiffness with chronic kyphotic postural changes, hip extension weakness, and intermittent radicular symptoms contributing. Patient's pattern of symptoms suggests sustained spinal extension intolerance and difficulty with postural endurance is likely contributing to condition. Previous episode of care was able to improve patient's quality of life some with decreased pain and improved function and patient has potential for improvement from his current state in these areas. Patient presents with significant pain, joint stiffness, posture, ROM, motor control, muscle performance (strength/power/endurance), and activity tolerance impairments that are limiting ability to complete his usual activities including anything that requires a lot of standing/walking, cooking for church/wine club, cleaning the house, waiting in line, standing longer than 5-20 min, having conversations with others while standing, yard work, Orthoptist, cleaning, sit <> stand, bending and lifting together, staining deck, shopping, walking more than 2 blocks, social participation, changing positions in the bed, golfing without difficulty. Patient will benefit from skilled physical therapy intervention to address current body structure impairments and activity limitations to improve function and work towards goals set in current POC in order to return to prior level of function or maximal functional improvement.      OBJECTIVE IMPAIRMENTS Abnormal gait, decreased activity tolerance, decreased endurance, decreased mobility, difficulty walking, decreased ROM, decreased strength, hypomobility,  impaired perceived functional ability, increased muscle spasms, impaired flexibility, improper body mechanics, postural dysfunction, and pain.    ACTIVITY LIMITATIONS carrying, lifting, bending, standing, bed mobility, dressing, and locomotion level   PARTICIPATION LIMITATIONS: meal prep, cleaning, laundry, interpersonal relationship, shopping, community activity, yard work, church, and   asthma, Zenker's (hypopharyngeal) diverticulum, afib, GERD, hx of fatty liver, hypothyroidism, BPH, OA, hx adeomatous polyp of colon, inguinal hernia, L knee pain, hx of dysplastic nevus, colon surgery (7 inches removed of colon), cataract extraction, L knee pain   PERSONAL FACTORS Age, Past/current experiences, Time since onset of injury/illness/exacerbation, and 3+ comorbidities:   asthma, Zenker's (hypopharyngeal) diverticulum, afib, GERD, hx of fatty liver, hypothyroidism, BPH, OA, hx adeomatous polyp of colon, inguinal hernia, L knee pain, hx of dysplastic nevus, colon surgery (7 inches removed of colon), cataract extraction, L knee pain are also affecting patient's functional outcome.    REHAB POTENTIAL: Good   CLINICAL DECISION MAKING: Stable/uncomplicated   EVALUATION COMPLEXITY: Low     GOALS: Goals reviewed with patient? yes   SHORT TERM GOALS: Target date: 01/03/2022   Patient will be independent with initial home exercise program for self-management of symptoms. Baseline: Initial HEP to be provided at visit 2 as appropriate (  12/20/21); initial HEP provided visit 2 (12/26/2021);  Goal status: met    LONG TERM GOALS: Target date: 03/14/2022. UPDATED to 06/07/2022 for all unmet goals on 03/15/2022.    Patient will be independent with a long-term home exercise program for self-management of symptoms.  Baseline: Initial HEP to be provided at visit 2 as appropriate (12/20/21); initial HEP provided visit 2 (12/26/2021); participating in HEP (01/23/2022); currently participating (03/15/2022);   Goal  status: In-progress   2.  Patient will demonstrate improved FOTO to equal or greater than 67 by visit #11 to demonstrate improvement in overall condition and self-reported functional ability.  Baseline: 54 (12/20/21); 62 at visit # 10 (01/23/2022); 65 at visit #18 (03/15/2022); Goal status: In-progress   3.  Patient will ambulate equal or greater than 1200 feet on 6 Minute Walk Test with no increase in symptoms to demonstrate improved community ambulation.  Baseline: to be tested visit 2 as appropriate (12/20/21); 1,225' up to 2/10 NPS (12/26/2021); 1263 feet with no AD, no complaint of pain while walking. Pain at waist when going to sit after walking. R knee pain (01/23/2022); 1347 feet with no AD, stiffening at about  3 min. Pain at right scapula up to 4-5/10. R knee pain (03/15/2022);  Goal status: Met 01/23/2022   4.  Patient will demonstrate the ability to lift 25# box floor to waist 1x10 times in a row with no increase in symptoms to improve his ability to bend and lift things around his home.  Baseline: reports difficulty with bending and lifting items at home  (12/20/21); 3 reps at 25#. Reported pain with each lift at low back increasing to 4/10 at 25# with request to stop (01/23/2022); 1x10 reps at 25#. Reported pain with each lift at low back increasing to 5/10 at 25#. Incomplete stand and occasionally missing touching floor (03/15/2022);  Goal status: In-progress   5.  Patient will report the ability to stand/walk equal or greater than 60 min before needing to sit to improve his ability to stand in line, socialize, and cook.  Baseline: 5-20 min (12/20/21); on his feet 60 min with salon pass on his back making hashbrown casserole this morning before he started to get pain, he feels he would not be able to do this without the salon pass (01/23/2022): Has not tested it recently, there is a party coming up where he will be cooking in November (03/15/2022);  Goal status: Nearly met.      PLAN: PT  FREQUENCY: 1-2x/week   PT DURATION: 12 weeks   PLANNED INTERVENTIONS: Therapeutic exercises, Therapeutic activity, Neuromuscular re-education, Patient/Family education, Joint mobilization, DME instructions, Dry Needling, Electrical stimulation, Spinal mobilization, Cryotherapy, Moist heat, Manual therapy, and Re-evaluation.   PLAN FOR NEXT SESSION: update HEP as appropriate, manual therapy and exercises for improved joint mobility in the spine.  Everlean Alstrom. Graylon Good, PT, DPT 04/26/22, 5:50 PM  [facility] [address] [place], [place] [address] P: [phone] I F: [phone]

## 2022-05-01 ENCOUNTER — Encounter: Payer: Self-pay | Admitting: Physical Therapy

## 2022-05-01 ENCOUNTER — Ambulatory Visit: Payer: Medicare Other | Attending: Family Medicine | Admitting: Physical Therapy

## 2022-05-01 DIAGNOSIS — R293 Abnormal posture: Secondary | ICD-10-CM | POA: Insufficient documentation

## 2022-05-01 DIAGNOSIS — R262 Difficulty in walking, not elsewhere classified: Secondary | ICD-10-CM | POA: Insufficient documentation

## 2022-05-01 DIAGNOSIS — M546 Pain in thoracic spine: Secondary | ICD-10-CM | POA: Insufficient documentation

## 2022-05-01 DIAGNOSIS — M5459 Other low back pain: Secondary | ICD-10-CM | POA: Diagnosis not present

## 2022-05-01 NOTE — Therapy (Signed)
OUTPATIENT PHYSICAL THERAPY TREATMENT NOTE   Patient Name: Daniel [name] MRN: [id number] DOB:1950-10-04, 71 y.o., male Today's Date: 05/01/2022  PCP: Jerrol Banana., MD REFERRING PROVIDER: Allene Dillon, NP  END OF SESSION:   PT End of Session - 05/01/22 1911     Visit Number 23    Number of Visits 24    Date for PT Re-Evaluation 06/07/22    Authorization Type [employer] MEDICARE reporting period from 03/29/2022    Progress Note Due on Visit 30    PT Start Time 1818    PT Stop Time 1856    PT Time Calculation (min) 38 min    Activity Tolerance Patient tolerated treatment well    Behavior During Therapy [facility] for tasks assessed/performed                Past Medical History:  Diagnosis Date   Actinic keratosis    Asthma    Eczema    Hx of dysplastic nevus 06/06/2006   Mid lower back, slight atypia   Hypothyroidism    Seasonal allergies    Thyroid disease    Past Surgical History:  Procedure Laterality Date   CATARACT EXTRACTION     COLON SURGERY     COLONOSCOPY WITH PROPOFOL N/A 11/18/2014   Procedure: COLONOSCOPY WITH PROPOFOL;  Surgeon: [name], MD;  Location: [facility] ENDOSCOPY;  Service: Endoscopy;  Laterality: N/A;   COLONOSCOPY WITH PROPOFOL N/A 12/13/2021   Procedure: COLONOSCOPY WITH PROPOFOL;  Surgeon: [name], MD;  Location: [facility] ENDOSCOPY;  Service: Endoscopy;  Laterality: N/A;   EYE SURGERY     KNEE SURGERY     NASAL SEPTUM SURGERY     Patient Active Problem List   Diagnosis Date Noted   Paroxysmal atrial fibrillation ([place]) 03/28/2022   Idiopathic hypotension 03/28/2022   Spondylolysis, lumbar region 03/28/2022   Avitaminosis D 03/28/2022   Myalgia due to statin 03/28/2022   Acquired thrombophilia ([place]) 03/28/2022   Hyperlipidemia 03/28/2022   Abnormal gait 03/28/2022   Mild intermittent asthma without complication 99/37/1696   Zenker's (hypopharyngeal) diverticulum 03/22/2018   Gastroesophageal reflux disease 03/22/2018    Fatty liver 10/09/2017   BPH (benign prostatic hyperplasia) 11/21/2014   Hypothyroidism 11/21/2014   Inguinal hernia 11/21/2014   OA (osteoarthritis) 11/21/2014   H/O adenomatous polyp of colon 10/14/2014    REFERRING DIAG: lumbar DDD (degenerative disc disease), lumbar stenosis with neurogenic claudication, lumbar radiculitis  THERAPY DIAG:  Other low back pain  Pain in thoracic spine  Abnormal posture  Difficulty in walking, not elsewhere classified  Rationale for Evaluation and Treatment: Rehabilitation  PERTINENT HISTORY: Patient is a 71 y.o. male who presents to outpatient physical therapy with a referral for medical diagnosis lumbar DDD (degenerative disc disease, lumbar stenosis with neurogenic claudication, lumbar radiculitis. This patient's chief complaints consist of chronic pain in the low back and upper glutes that moves up towards his B scapulae with prolonged standing/walking and with intermittent shooting down his R left to the knee, leading to the following functional deficits: difficulty with anything that requires a lot of standing/walking, cooking for church/wine club, cleaning the house, waiting in line, standing longer than 5-20 min, having conversations with others while standing, yard work, Orthoptist, cleaning, sit <> stand, bending and lifting together, staining deck, shopping, walking more than 2 blocks, social participation, changing positions in the bed, golfing.    Relevant past medical history and comorbidities include asthma, Zenker's (hypopharyngeal) diverticulum, afib, GERD, hx of  fatty liver, hypothyroidism, BPH, OA, hx adeomatous polyp of colon, inguinal hernia, L knee pain, hx of dysplastic nevus, colon surgery (7 inches removed of colon), cataract extraction, L knee pain.  Patient denies hx of cancer, stroke, seizures, lung problem, diabetes, unexplained weight loss, unexplained changes in bowel or bladder problems, unexplained stumbling or  dropping things, spinal surgery. He has been exercising daily.     PRECAUTIONS: none  SUBJECTIVE: Patient reports he has 1/10 pain in the low back and it feels stiff this evening. He reports it was pretty sore this morning. He states he felt okay after last PT session. He put up the rest of his christmas decorations yesterday with his back belt on. His back hurt while he was doing, but felt better when he sat.   PAIN:  Are you having pain? NPRS 0/10 and no pain   OBJECTIVE     TODAY'S TREATMENT  Therapeutic exercise: to centralize symptoms and improve ROM, strength, muscular endurance, and activity tolerance required for successful completion of functional activities. - standing frontal plane leg swings at TM bar with B UE support, 3x30 seconds each foot.  - bent over trunk rotation reaches with one arm on TM bar in hip flexion, and the other arm reaching to ceiling to across chest, 1x30 seconds each arm.  (Manual therapy - see below) - prone scapular retraction B UE at 90/90 position. 3x10. cuing for improved ROM and scapular retraction. Fatigues at 5th rep.  - prone press up, 3x10 - STS from 18 inch chair (Green) with green theraball overhead raises to tap green theraball stand overhead. 2x8. (Limited by legs) - seated on edge of plinth adjusted to allow him to sit with knees slightly bent: good morning with 5#DB on each shoulder, 3x10. Cuing for improved ROM and flat back.  - standing horizontal abduction with YTB anchored on opposite side, 3x10 each side. Cuing for posture and UE position.  - standing shoulder AAROM holding gait belt with both hands moving shoulders into flexion towards hyperflexion and back with hands spread as wide as needed to get belt overhead, 3x10 (good effect on lifting chest).   Manual therapy: to reduce pain and tissue tension, improve range of motion, neuromodulation, in order to promote improved ability to complete functional activities. PRONE with face in  blue face cradle - CPA grade III-IV thoracic spine T2 through L5. ~ 10-40  Pt required multimodal cuing for proper technique and to facilitate improved neuromuscular control, strength, range of motion, and functional ability resulting in improved performance and form.   PATIENT EDUCATION:  Education details: Exercise purpose/form. Self management techniques.  Reviewed cancelation/no-show policy with patient and confirmed patient has correct phone number for clinic; patient verbalized understanding (01/18/22). Person educated: Patient Education method: Explanation, demonstration, VC, TC Education comprehension: verbalized understanding, demonstrated understanding, and needs further education     HOME EXERCISE PROGRAM: Access Code: [email or web address] URL: [email or web address] Date: 02/08/2022 Prepared by: Rosita Kea  Exercises - Sidelying Thoracic Rotation with Open Book  - 1-2 x daily - 3 sets - 10 reps - 5 seconds hold - Bridge  - 1-2 x daily - 3 sets - 10 reps - 5 seconds hold - Supine Lower Trunk Rotation  - 1 x daily - 3 sets - 15 reps - Prone Press Up  - 1-2 x daily - 3 sets - 10 reps - Bilateral Bent Leg Lift  - 1-2 x daily - 3 sets - 10 reps - Seated Thoracic Lumbar Extension  -  Seated Flexion Stretch with Swiss Ball  - 1 x daily - 1 sets - 20 reps - 5 seconds hold - Baby Cobra Hands Hovering   - 1 x daily - 3 sets - 10 reps - 5 seconds hold   ASSESSMENT:   CLINICAL IMPRESSION: Patient arrives with low back pain today but increased stiffness. Session continued to focus on decreasing stiffness and improving postural strength, endurance, and function. Patient fatigued quickly with exercises but was able to complete them without lasting pain. Patient continues to require cuing for full range of motion and engagement of postural muscles. Plan to continue with similar focus next session as tolerated. Patient would benefit from continued management of limiting condition by  skilled physical therapist to address remaining impairments and functional limitations to work towards stated goals and return to PLOF or maximal functional independence.    From PT eval 12/20/2021:  Patient is a 71 y.o. male referred to outpatient physical therapy with a medical diagnosis of lumbar DDD (degenerative disc disease, lumbar stenosis with neurogenic claudication, lumbar radiculitis who presents with signs and symptoms consistent with chronic low back and thoracic spine pain and stiffness with chronic kyphotic postural changes, hip extension weakness, and intermittent radicular symptoms contributing. Patient's pattern of symptoms suggests sustained spinal extension intolerance and difficulty with postural endurance is likely contributing to condition. Previous episode of care was able to improve patient's quality of life some with decreased pain and improved function and patient has potential for improvement from his current state in these areas. Patient presents with significant pain, joint stiffness, posture, ROM, motor control, muscle performance (strength/power/endurance), and activity tolerance impairments that are limiting ability to complete his usual activities including anything that requires a lot of standing/walking, cooking for church/wine club, cleaning the house, waiting in line, standing longer than 5-20 min, having conversations with others while standing, yard work, Orthoptist, cleaning, sit <> stand, bending and lifting together, staining deck, shopping, walking more than 2 blocks, social participation, changing positions in the bed, golfing without difficulty. Patient will benefit from skilled physical therapy intervention to address current body structure impairments and activity limitations to improve function and work towards goals set in current POC in order to return to prior level of function or maximal functional improvement.      OBJECTIVE IMPAIRMENTS Abnormal  gait, decreased activity tolerance, decreased endurance, decreased mobility, difficulty walking, decreased ROM, decreased strength, hypomobility, impaired perceived functional ability, increased muscle spasms, impaired flexibility, improper body mechanics, postural dysfunction, and pain.    ACTIVITY LIMITATIONS carrying, lifting, bending, standing, bed mobility, dressing, and locomotion level   PARTICIPATION LIMITATIONS: meal prep, cleaning, laundry, interpersonal relationship, shopping, community activity, yard work, church, and   asthma, Zenker's (hypopharyngeal) diverticulum, afib, GERD, hx of fatty liver, hypothyroidism, BPH, OA, hx adeomatous polyp of colon, inguinal hernia, L knee pain, hx of dysplastic nevus, colon surgery (7 inches removed of colon), cataract extraction, L knee pain   PERSONAL FACTORS Age, Past/current experiences, Time since onset of injury/illness/exacerbation, and 3+ comorbidities:   asthma, Zenker's (hypopharyngeal) diverticulum, afib, GERD, hx of fatty liver, hypothyroidism, BPH, OA, hx adeomatous polyp of colon, inguinal hernia, L knee pain, hx of dysplastic nevus, colon surgery (7 inches removed of colon), cataract extraction, L knee pain are also affecting patient's functional outcome.    REHAB POTENTIAL: Good   CLINICAL DECISION MAKING: Stable/uncomplicated   EVALUATION COMPLEXITY: Low     GOALS: Goals reviewed with patient? yes   SHORT TERM GOALS: Target date:  01/03/2022   Patient will be independent with initial home exercise program for self-management of symptoms. Baseline: Initial HEP to be provided at visit 2 as appropriate (12/20/21); initial HEP provided visit 2 (12/26/2021);  Goal status: met    LONG TERM GOALS: Target date: 03/14/2022. UPDATED to 06/07/2022 for all unmet goals on 03/15/2022.    Patient will be independent with a long-term home exercise program for self-management of symptoms.  Baseline: Initial HEP to be provided at visit 2 as  appropriate (12/20/21); initial HEP provided visit 2 (12/26/2021); participating in HEP (01/23/2022); currently participating (03/15/2022);   Goal status: In-progress   2.  Patient will demonstrate improved FOTO to equal or greater than 67 by visit #11 to demonstrate improvement in overall condition and self-reported functional ability.  Baseline: 54 (12/20/21); 62 at visit # 10 (01/23/2022); 65 at visit #18 (03/15/2022); Goal status: In-progress   3.  Patient will ambulate equal or greater than 1200 feet on 6 Minute Walk Test with no increase in symptoms to demonstrate improved community ambulation.  Baseline: to be tested visit 2 as appropriate (12/20/21); 1,225' up to 2/10 NPS (12/26/2021); 1263 feet with no AD, no complaint of pain while walking. Pain at waist when going to sit after walking. R knee pain (01/23/2022); 1347 feet with no AD, stiffening at about  3 min. Pain at right scapula up to 4-5/10. R knee pain (03/15/2022);  Goal status: Met 01/23/2022   4.  Patient will demonstrate the ability to lift 25# box floor to waist 1x10 times in a row with no increase in symptoms to improve his ability to bend and lift things around his home.  Baseline: reports difficulty with bending and lifting items at home  (12/20/21); 3 reps at 25#. Reported pain with each lift at low back increasing to 4/10 at 25# with request to stop (01/23/2022); 1x10 reps at 25#. Reported pain with each lift at low back increasing to 5/10 at 25#. Incomplete stand and occasionally missing touching floor (03/15/2022);  Goal status: In-progress   5.  Patient will report the ability to stand/walk equal or greater than 60 min before needing to sit to improve his ability to stand in line, socialize, and cook.  Baseline: 5-20 min (12/20/21); on his feet 60 min with salon pass on his back making hashbrown casserole this morning before he started to get pain, he feels he would not be able to do this without the salon pass (01/23/2022): Has  not tested it recently, there is a party coming up where he will be cooking in November (03/15/2022);  Goal status: Nearly met.      PLAN: PT FREQUENCY: 1-2x/week   PT DURATION: 12 weeks   PLANNED INTERVENTIONS: Therapeutic exercises, Therapeutic activity, Neuromuscular re-education, Patient/Family education, Joint mobilization, DME instructions, Dry Needling, Electrical stimulation, Spinal mobilization, Cryotherapy, Moist heat, Manual therapy, and Re-evaluation.   PLAN FOR NEXT SESSION: update HEP as appropriate, manual therapy and exercises for improved joint mobility in the spine.  Everlean Alstrom. Graylon Good, PT, DPT 05/01/22, 7:14 PM  [facility] [address] [place], [place] [address] P: [phone] I F: [phone]

## 2022-05-03 ENCOUNTER — Ambulatory Visit: Payer: Medicare Other | Admitting: Physical Therapy

## 2022-05-03 ENCOUNTER — Encounter: Payer: Self-pay | Admitting: Physical Therapy

## 2022-05-03 DIAGNOSIS — R293 Abnormal posture: Secondary | ICD-10-CM | POA: Diagnosis not present

## 2022-05-03 DIAGNOSIS — M5459 Other low back pain: Secondary | ICD-10-CM | POA: Diagnosis not present

## 2022-05-03 DIAGNOSIS — R262 Difficulty in walking, not elsewhere classified: Secondary | ICD-10-CM

## 2022-05-03 DIAGNOSIS — M546 Pain in thoracic spine: Secondary | ICD-10-CM | POA: Diagnosis not present

## 2022-05-03 NOTE — Therapy (Signed)
OUTPATIENT PHYSICAL THERAPY TREATMENT NOTE   Patient Name: Daniel [name] MRN: [id number] DOB:1950-12-31, 71 y.o., male Today's Date: 05/03/2022  PCP: Jerrol Banana., MD REFERRING PROVIDER: Allene Dillon, NP  END OF SESSION:   PT End of Session - 05/03/22 1624     Visit Number 24    Number of Visits 24    Date for PT Re-Evaluation 06/07/22    Authorization Type [employer] MEDICARE reporting period from 03/29/2022    Progress Note Due on Visit 30    PT Start Time 1602    PT Stop Time 1640    PT Time Calculation (min) 38 min    Activity Tolerance Patient tolerated treatment well    Behavior During Therapy [facility] for tasks assessed/performed                 Past Medical History:  Diagnosis Date   Actinic keratosis    Asthma    Eczema    Hx of dysplastic nevus 06/06/2006   Mid lower back, slight atypia   Hypothyroidism    Seasonal allergies    Thyroid disease    Past Surgical History:  Procedure Laterality Date   CATARACT EXTRACTION     COLON SURGERY     COLONOSCOPY WITH PROPOFOL N/A 11/18/2014   Procedure: COLONOSCOPY WITH PROPOFOL;  Surgeon: [name], MD;  Location: [facility] ENDOSCOPY;  Service: Endoscopy;  Laterality: N/A;   COLONOSCOPY WITH PROPOFOL N/A 12/13/2021   Procedure: COLONOSCOPY WITH PROPOFOL;  Surgeon: [name], MD;  Location: [facility] ENDOSCOPY;  Service: Endoscopy;  Laterality: N/A;   EYE SURGERY     KNEE SURGERY     NASAL SEPTUM SURGERY     Patient Active Problem List   Diagnosis Date Noted   Paroxysmal atrial fibrillation ([place]) 03/28/2022   Idiopathic hypotension 03/28/2022   Spondylolysis, lumbar region 03/28/2022   Avitaminosis D 03/28/2022   Myalgia due to statin 03/28/2022   Acquired thrombophilia ([place]) 03/28/2022   Hyperlipidemia 03/28/2022   Abnormal gait 03/28/2022   Mild intermittent asthma without complication 75/44/9201   Zenker's (hypopharyngeal) diverticulum 03/22/2018   Gastroesophageal reflux disease 03/22/2018    Fatty liver 10/09/2017   BPH (benign prostatic hyperplasia) 11/21/2014   Hypothyroidism 11/21/2014   Inguinal hernia 11/21/2014   OA (osteoarthritis) 11/21/2014   H/O adenomatous polyp of colon 10/14/2014    REFERRING DIAG: lumbar DDD (degenerative disc disease), lumbar stenosis with neurogenic claudication, lumbar radiculitis  THERAPY DIAG:  Other low back pain  Pain in thoracic spine  Abnormal posture  Difficulty in walking, not elsewhere classified  Rationale for Evaluation and Treatment: Rehabilitation  PERTINENT HISTORY: Patient is a 71 y.o. male who presents to outpatient physical therapy with a referral for medical diagnosis lumbar DDD (degenerative disc disease, lumbar stenosis with neurogenic claudication, lumbar radiculitis. This patient's chief complaints consist of chronic pain in the low back and upper glutes that moves up towards his B scapulae with prolonged standing/walking and with intermittent shooting down his R left to the knee, leading to the following functional deficits: difficulty with anything that requires a lot of standing/walking, cooking for church/wine club, cleaning the house, waiting in line, standing longer than 5-20 min, having conversations with others while standing, yard work, Orthoptist, cleaning, sit <> stand, bending and lifting together, staining deck, shopping, walking more than 2 blocks, social participation, changing positions in the bed, golfing.    Relevant past medical history and comorbidities include asthma, Zenker's (hypopharyngeal) diverticulum, afib, GERD, hx  of fatty liver, hypothyroidism, BPH, OA, hx adeomatous polyp of colon, inguinal hernia, L knee pain, hx of dysplastic nevus, colon surgery (7 inches removed of colon), cataract extraction, L knee pain.  Patient denies hx of cancer, stroke, seizures, lung problem, diabetes, unexplained weight loss, unexplained changes in bowel or bladder problems, unexplained stumbling or  dropping things, spinal surgery. He has been exercising daily.     PRECAUTIONS: none  SUBJECTIVE: Patient reports he has 1/10 pain in the low back after sitting in the waiting room. He states mornings are the hardest due to increased stiffness and pain.   PAIN:  Are you having pain? NPRS 1/10 in low back  OBJECTIVE     TODAY'S TREATMENT  Therapeutic exercise: to centralize symptoms and improve ROM, strength, muscular endurance, and activity tolerance required for successful completion of functional activities. - standing frontal plane leg swings at TM bar with B UE support, 3x30 seconds each foot.  - standing shoulder AAROM holding gait belt with both hands moving shoulders into flexion towards hyperflexion and back with hands spread as wide as needed to get belt overhead, 3x10 (good effect on lifting chest).  (Manual therapy - see below) - prone press up, 3x10 - prone scapular retraction with upper back extension and cervical spine retraction,  B UE at 90/90 position. 3x10. cuing for improved ROM and scapular retraction. - STS from 18 inch chair (Green) with green theraball overhead raises to tap green theraball stand overhead. 3x8. (reports leg fatigue) - standing horizontal abduction with RTB anchored on opposite side, 3x10 each side. Cuing for posture and UE position.  - seated on edge of plinth adjusted to allow him to sit with knees slightly bent: good morning with 5#DB on each shoulder, 3x10. Cuing for improved ROM and flat back.  - standing hip extension at hip machine from position 3, 3x10 each side with B UE support at 100#  Manual therapy: to reduce pain and tissue tension, improve range of motion, neuromodulation, in order to promote improved ability to complete functional activities. PRONE with face in table face cradle - CPA grade III-IV thoracic spine T2 through L5. ~ 20 per segment.  Pt required multimodal cuing for proper technique and to facilitate improved  neuromuscular control, strength, range of motion, and functional ability resulting in improved performance and form.   PATIENT EDUCATION:  Education details: Exercise purpose/form. Self management techniques.  Reviewed cancelation/no-show policy with patient and confirmed patient has correct phone number for clinic; patient verbalized understanding (01/18/22). Person educated: Patient Education method: Explanation, demonstration, VC, TC Education comprehension: verbalized understanding, demonstrated understanding, and needs further education     HOME EXERCISE PROGRAM: Access Code: [email or web address] URL: [email or web address] Date: 02/08/2022 Prepared by: Rosita Kea  Exercises - Sidelying Thoracic Rotation with Open Book  - 1-2 x daily - 3 sets - 10 reps - 5 seconds hold - Bridge  - 1-2 x daily - 3 sets - 10 reps - 5 seconds hold - Supine Lower Trunk Rotation  - 1 x daily - 3 sets - 15 reps - Prone Press Up  - 1-2 x daily - 3 sets - 10 reps - Bilateral Bent Leg Lift  - 1-2 x daily - 3 sets - 10 reps - Seated Thoracic Lumbar Extension  - Seated Flexion Stretch with Swiss Ball  - 1 x daily - 1 sets - 20 reps - 5 seconds hold - Baby Cobra Hands Hovering   - 1 x daily - 3 sets - 10  reps - 5 seconds hold   ASSESSMENT:   CLINICAL IMPRESSION: Patient arrives with mild low back pain and moderate stiffness. Continues to report higher pain and stiffness in the mornings or when he is still that improves with movement and activity. Today's session continued to focus on improving spinal and posture ROM and mobility as well as postural strength and endurance. Although patinet continues to be limited in these areas, he was able to complete more exercise volume this session. He continues to require skilled PT for cuing to maximize ROM, and for exercise selection, reps, sets, and resistance. Patient would benefit from continued management of limiting condition by skilled physical therapist to  address remaining impairments and functional limitations to work towards stated goals and return to PLOF or maximal functional independence.    From PT eval 12/20/2021:  Patient is a 71 y.o. male referred to outpatient physical therapy with a medical diagnosis of lumbar DDD (degenerative disc disease, lumbar stenosis with neurogenic claudication, lumbar radiculitis who presents with signs and symptoms consistent with chronic low back and thoracic spine pain and stiffness with chronic kyphotic postural changes, hip extension weakness, and intermittent radicular symptoms contributing. Patient's pattern of symptoms suggests sustained spinal extension intolerance and difficulty with postural endurance is likely contributing to condition. Previous episode of care was able to improve patient's quality of life some with decreased pain and improved function and patient has potential for improvement from his current state in these areas. Patient presents with significant pain, joint stiffness, posture, ROM, motor control, muscle performance (strength/power/endurance), and activity tolerance impairments that are limiting ability to complete his usual activities including anything that requires a lot of standing/walking, cooking for church/wine club, cleaning the house, waiting in line, standing longer than 5-20 min, having conversations with others while standing, yard work, Orthoptist, cleaning, sit <> stand, bending and lifting together, staining deck, shopping, walking more than 2 blocks, social participation, changing positions in the bed, golfing without difficulty. Patient will benefit from skilled physical therapy intervention to address current body structure impairments and activity limitations to improve function and work towards goals set in current POC in order to return to prior level of function or maximal functional improvement.      OBJECTIVE IMPAIRMENTS Abnormal gait, decreased activity  tolerance, decreased endurance, decreased mobility, difficulty walking, decreased ROM, decreased strength, hypomobility, impaired perceived functional ability, increased muscle spasms, impaired flexibility, improper body mechanics, postural dysfunction, and pain.    ACTIVITY LIMITATIONS carrying, lifting, bending, standing, bed mobility, dressing, and locomotion level   PARTICIPATION LIMITATIONS: meal prep, cleaning, laundry, interpersonal relationship, shopping, community activity, yard work, church, and   asthma, Zenker's (hypopharyngeal) diverticulum, afib, GERD, hx of fatty liver, hypothyroidism, BPH, OA, hx adeomatous polyp of colon, inguinal hernia, L knee pain, hx of dysplastic nevus, colon surgery (7 inches removed of colon), cataract extraction, L knee pain   PERSONAL FACTORS Age, Past/current experiences, Time since onset of injury/illness/exacerbation, and 3+ comorbidities:   asthma, Zenker's (hypopharyngeal) diverticulum, afib, GERD, hx of fatty liver, hypothyroidism, BPH, OA, hx adeomatous polyp of colon, inguinal hernia, L knee pain, hx of dysplastic nevus, colon surgery (7 inches removed of colon), cataract extraction, L knee pain are also affecting patient's functional outcome.    REHAB POTENTIAL: Good   CLINICAL DECISION MAKING: Stable/uncomplicated   EVALUATION COMPLEXITY: Low     GOALS: Goals reviewed with patient? yes   SHORT TERM GOALS: Target date: 01/03/2022   Patient will be independent with initial home exercise  program for self-management of symptoms. Baseline: Initial HEP to be provided at visit 2 as appropriate (12/20/21); initial HEP provided visit 2 (12/26/2021);  Goal status: met    LONG TERM GOALS: Target date: 03/14/2022. UPDATED to 06/07/2022 for all unmet goals on 03/15/2022.    Patient will be independent with a long-term home exercise program for self-management of symptoms.  Baseline: Initial HEP to be provided at visit 2 as appropriate (12/20/21);  initial HEP provided visit 2 (12/26/2021); participating in HEP (01/23/2022); currently participating (03/15/2022);   Goal status: In-progress   2.  Patient will demonstrate improved FOTO to equal or greater than 67 by visit #11 to demonstrate improvement in overall condition and self-reported functional ability.  Baseline: 54 (12/20/21); 62 at visit # 10 (01/23/2022); 65 at visit #18 (03/15/2022); Goal status: In-progress   3.  Patient will ambulate equal or greater than 1200 feet on 6 Minute Walk Test with no increase in symptoms to demonstrate improved community ambulation.  Baseline: to be tested visit 2 as appropriate (12/20/21); 1,225' up to 2/10 NPS (12/26/2021); 1263 feet with no AD, no complaint of pain while walking. Pain at waist when going to sit after walking. R knee pain (01/23/2022); 1347 feet with no AD, stiffening at about  3 min. Pain at right scapula up to 4-5/10. R knee pain (03/15/2022);  Goal status: Met 01/23/2022   4.  Patient will demonstrate the ability to lift 25# box floor to waist 1x10 times in a row with no increase in symptoms to improve his ability to bend and lift things around his home.  Baseline: reports difficulty with bending and lifting items at home  (12/20/21); 3 reps at 25#. Reported pain with each lift at low back increasing to 4/10 at 25# with request to stop (01/23/2022); 1x10 reps at 25#. Reported pain with each lift at low back increasing to 5/10 at 25#. Incomplete stand and occasionally missing touching floor (03/15/2022);  Goal status: In-progress   5.  Patient will report the ability to stand/walk equal or greater than 60 min before needing to sit to improve his ability to stand in line, socialize, and cook.  Baseline: 5-20 min (12/20/21); on his feet 60 min with salon pass on his back making hashbrown casserole this morning before he started to get pain, he feels he would not be able to do this without the salon pass (01/23/2022): Has not tested it recently,  there is a party coming up where he will be cooking in November (03/15/2022);  Goal status: Nearly met.      PLAN: PT FREQUENCY: 1-2x/week   PT DURATION: 12 weeks   PLANNED INTERVENTIONS: Therapeutic exercises, Therapeutic activity, Neuromuscular re-education, Patient/Family education, Joint mobilization, DME instructions, Dry Needling, Electrical stimulation, Spinal mobilization, Cryotherapy, Moist heat, Manual therapy, and Re-evaluation.   PLAN FOR NEXT SESSION: update HEP as appropriate, manual therapy and exercises for improved joint mobility in the spine.  Everlean Alstrom. Graylon Good, PT, DPT 05/03/22, 7:30 PM  [facility] [address] [place], [place] [address] P: [phone] I F: [phone]

## 2022-05-04 ENCOUNTER — Ambulatory Visit: Payer: Medicare Other | Admitting: Family Medicine

## 2022-05-08 ENCOUNTER — Encounter: Payer: Self-pay | Admitting: Physical Therapy

## 2022-05-08 ENCOUNTER — Ambulatory Visit: Payer: Medicare Other | Admitting: Physical Therapy

## 2022-05-08 DIAGNOSIS — M546 Pain in thoracic spine: Secondary | ICD-10-CM | POA: Diagnosis not present

## 2022-05-08 DIAGNOSIS — R293 Abnormal posture: Secondary | ICD-10-CM

## 2022-05-08 DIAGNOSIS — M5459 Other low back pain: Secondary | ICD-10-CM | POA: Diagnosis not present

## 2022-05-08 DIAGNOSIS — R262 Difficulty in walking, not elsewhere classified: Secondary | ICD-10-CM

## 2022-05-08 NOTE — Therapy (Signed)
OUTPATIENT PHYSICAL THERAPY TREATMENT NOTE   Patient Name: Daniel [name] MRN: [id number] DOB:Jun 10, 1950, 71 y.o., male Today's Date: 05/08/2022  PCP: Jerrol Banana., MD REFERRING PROVIDER: Allene Dillon, NP  END OF SESSION:   PT End of Session - 05/08/22 1534     Visit Number 25    Number of Visits 41    Date for PT Re-Evaluation 06/07/22    Authorization Type [employer] MEDICARE reporting period from 03/29/2022    Progress Note Due on Visit 30    PT Start Time 1517    PT Stop Time 1555    PT Time Calculation (min) 38 min    Activity Tolerance Patient tolerated treatment well    Behavior During Therapy [facility] for tasks assessed/performed                 Past Medical History:  Diagnosis Date   Actinic keratosis    Asthma    Eczema    Hx of dysplastic nevus 06/06/2006   Mid lower back, slight atypia   Hypothyroidism    Seasonal allergies    Thyroid disease    Past Surgical History:  Procedure Laterality Date   CATARACT EXTRACTION     COLON SURGERY     COLONOSCOPY WITH PROPOFOL N/A 11/18/2014   Procedure: COLONOSCOPY WITH PROPOFOL;  Surgeon: [name], MD;  Location: [facility] ENDOSCOPY;  Service: Endoscopy;  Laterality: N/A;   COLONOSCOPY WITH PROPOFOL N/A 12/13/2021   Procedure: COLONOSCOPY WITH PROPOFOL;  Surgeon: [name], MD;  Location: [facility] ENDOSCOPY;  Service: Endoscopy;  Laterality: N/A;   EYE SURGERY     KNEE SURGERY     NASAL SEPTUM SURGERY     Patient Active Problem List   Diagnosis Date Noted   Paroxysmal atrial fibrillation ([place]) 03/28/2022   Idiopathic hypotension 03/28/2022   Spondylolysis, lumbar region 03/28/2022   Avitaminosis D 03/28/2022   Myalgia due to statin 03/28/2022   Acquired thrombophilia ([place]) 03/28/2022   Hyperlipidemia 03/28/2022   Abnormal gait 03/28/2022   Mild intermittent asthma without complication 56/81/2751   Zenker's (hypopharyngeal) diverticulum 03/22/2018   Gastroesophageal reflux disease 03/22/2018    Fatty liver 10/09/2017   BPH (benign prostatic hyperplasia) 11/21/2014   Hypothyroidism 11/21/2014   Inguinal hernia 11/21/2014   OA (osteoarthritis) 11/21/2014   H/O adenomatous polyp of colon 10/14/2014    REFERRING DIAG: lumbar DDD (degenerative disc disease), lumbar stenosis with neurogenic claudication, lumbar radiculitis  THERAPY DIAG:  Other low back pain  Pain in thoracic spine  Abnormal posture  Difficulty in walking, not elsewhere classified  Rationale for Evaluation and Treatment: Rehabilitation  PERTINENT HISTORY: Patient is a 71 y.o. male who presents to outpatient physical therapy with a referral for medical diagnosis lumbar DDD (degenerative disc disease, lumbar stenosis with neurogenic claudication, lumbar radiculitis. This patient's chief complaints consist of chronic pain in the low back and upper glutes that moves up towards his B scapulae with prolonged standing/walking and with intermittent shooting down his R left to the knee, leading to the following functional deficits: difficulty with anything that requires a lot of standing/walking, cooking for church/wine club, cleaning the house, waiting in line, standing longer than 5-20 min, having conversations with others while standing, yard work, Orthoptist, cleaning, sit <> stand, bending and lifting together, staining deck, shopping, walking more than 2 blocks, social participation, changing positions in the bed, golfing.    Relevant past medical history and comorbidities include asthma, Zenker's (hypopharyngeal) diverticulum, afib, GERD, hx  of fatty liver, hypothyroidism, BPH, OA, hx adeomatous polyp of colon, inguinal hernia, L knee pain, hx of dysplastic nevus, colon surgery (7 inches removed of colon), cataract extraction, L knee pain.  Patient denies hx of cancer, stroke, seizures, lung problem, diabetes, unexplained weight loss, unexplained changes in bowel or bladder problems, unexplained stumbling or  dropping things, spinal surgery. He has been exercising daily.     PRECAUTIONS: none  SUBJECTIVE: patient reports everything is stiff after walking 4-5 miles each day this weekend in [place] and the tricky drive home. He had a lot of pain this morning in his back but now it is just stiffness.    PAIN:  Are you having pain? NPRS 0/10 in low back  OBJECTIVE     TODAY'S TREATMENT  Therapeutic exercise: to centralize symptoms and improve ROM, strength, muscular endurance, and activity tolerance required for successful completion of functional activities. - standing frontal plane leg swings at TM bar with B UE support, 3x30 seconds each foot. Cuing for increased ROM, back movement.  - standing shoulder AAROM holding gait belt with both hands moving shoulders into flexion towards hyperflexion and back with hands spread as wide as needed to get belt overhead, 3x10, cuing for improved elbow extension.  (Manual therapy - see below) - prone press up, 3x10 - prone scapular retraction with upper back extension and cervical spine retraction,  B UE at 90/90 position. 3x10. - STS from 18 inch chair (Green) with green theraball overhead raises to tap green theraball stand overhead. 3x8. (reports leg fatigue). Cuing for complete ROM, and help with counting.  - standing horizontal abduction with RTB anchored on opposite side, 3x10 each side. Cuing for  ROM and UE position.  - standing hip extension at hip machine from, height at position 4,  rotary position 3, 3x10 each side with B UE support at 100/130/160# (poor ROM on left, try 145# next time, left limiting).  - seated on edge of plinth adjusted to allow him to sit with knees slightly bent: good morning with 5#DB on each shoulder, 3x10. Cuing for improved ROM, hips wider,  and flat back.   Manual therapy: to reduce pain and tissue tension, improve range of motion, neuromodulation, in order to promote improved ability to complete functional  activities. PRONE with face in table face cradle - CPA grade III-IV thoracic spine T2 through L5. ~ 20 per segment.  Pt required multimodal cuing for proper technique and to facilitate improved neuromuscular control, strength, range of motion, and functional ability resulting in improved performance and form.   PATIENT EDUCATION:  Education details: Exercise purpose/form. Self management techniques.  Reviewed cancelation/no-show policy with patient and confirmed patient has correct phone number for clinic; patient verbalized understanding (01/18/22). Person educated: Patient Education method: Explanation, demonstration, VC, TC Education comprehension: verbalized understanding, demonstrated understanding, and needs further education     HOME EXERCISE PROGRAM: Access Code: [email or web address] URL: [email or web address] Date: 02/08/2022 Prepared by: Rosita Kea  Exercises - Sidelying Thoracic Rotation with Open Book  - 1-2 x daily - 3 sets - 10 reps - 5 seconds hold - Bridge  - 1-2 x daily - 3 sets - 10 reps - 5 seconds hold - Supine Lower Trunk Rotation  - 1 x daily - 3 sets - 15 reps - Prone Press Up  - 1-2 x daily - 3 sets - 10 reps - Bilateral Bent Leg Lift  - 1-2 x daily - 3 sets - 10 reps - Seated Thoracic Lumbar Extension  -  Seated Flexion Stretch with Swiss Ball  - 1 x daily - 1 sets - 20 reps - 5 seconds hold - Baby Cobra Hands Hovering   - 1 x daily - 3 sets - 10 reps - 5 seconds hold   ASSESSMENT:   CLINICAL IMPRESSION: Patient arrives increased sensation of stiffness after an active weekend at [facility]. Continued with interventions for trunk extension mobility and postural/functional strength to improve posture and activity tolerance. Patient continues to have difficulty staying out of flexed posture which negatively affects his function and back pain in upright positions. Patient continues to require PT guidance for exercise selection, volume, and manual  interventions. Patient would benefit from continued management of limiting condition by skilled physical therapist to address remaining impairments and functional limitations to work towards stated goals and return to PLOF or maximal functional independence.    From PT eval 12/20/2021:  Patient is a 71 y.o. male referred to outpatient physical therapy with a medical diagnosis of lumbar DDD (degenerative disc disease, lumbar stenosis with neurogenic claudication, lumbar radiculitis who presents with signs and symptoms consistent with chronic low back and thoracic spine pain and stiffness with chronic kyphotic postural changes, hip extension weakness, and intermittent radicular symptoms contributing. Patient's pattern of symptoms suggests sustained spinal extension intolerance and difficulty with postural endurance is likely contributing to condition. Previous episode of care was able to improve patient's quality of life some with decreased pain and improved function and patient has potential for improvement from his current state in these areas. Patient presents with significant pain, joint stiffness, posture, ROM, motor control, muscle performance (strength/power/endurance), and activity tolerance impairments that are limiting ability to complete his usual activities including anything that requires a lot of standing/walking, cooking for church/wine club, cleaning the house, waiting in line, standing longer than 5-20 min, having conversations with others while standing, yard work, Orthoptist, cleaning, sit <> stand, bending and lifting together, staining deck, shopping, walking more than 2 blocks, social participation, changing positions in the bed, golfing without difficulty. Patient will benefit from skilled physical therapy intervention to address current body structure impairments and activity limitations to improve function and work towards goals set in current POC in order to return to prior  level of function or maximal functional improvement.      OBJECTIVE IMPAIRMENTS Abnormal gait, decreased activity tolerance, decreased endurance, decreased mobility, difficulty walking, decreased ROM, decreased strength, hypomobility, impaired perceived functional ability, increased muscle spasms, impaired flexibility, improper body mechanics, postural dysfunction, and pain.    ACTIVITY LIMITATIONS carrying, lifting, bending, standing, bed mobility, dressing, and locomotion level   PARTICIPATION LIMITATIONS: meal prep, cleaning, laundry, interpersonal relationship, shopping, community activity, yard work, church, and   asthma, Zenker's (hypopharyngeal) diverticulum, afib, GERD, hx of fatty liver, hypothyroidism, BPH, OA, hx adeomatous polyp of colon, inguinal hernia, L knee pain, hx of dysplastic nevus, colon surgery (7 inches removed of colon), cataract extraction, L knee pain   PERSONAL FACTORS Age, Past/current experiences, Time since onset of injury/illness/exacerbation, and 3+ comorbidities:   asthma, Zenker's (hypopharyngeal) diverticulum, afib, GERD, hx of fatty liver, hypothyroidism, BPH, OA, hx adeomatous polyp of colon, inguinal hernia, L knee pain, hx of dysplastic nevus, colon surgery (7 inches removed of colon), cataract extraction, L knee pain are also affecting patient's functional outcome.    REHAB POTENTIAL: Good   CLINICAL DECISION MAKING: Stable/uncomplicated   EVALUATION COMPLEXITY: Low     GOALS: Goals reviewed with patient? yes   SHORT TERM GOALS: Target date: 01/03/2022  Patient will be independent with initial home exercise program for self-management of symptoms. Baseline: Initial HEP to be provided at visit 2 as appropriate (12/20/21); initial HEP provided visit 2 (12/26/2021);  Goal status: met    LONG TERM GOALS: Target date: 03/14/2022. UPDATED to 06/07/2022 for all unmet goals on 03/15/2022.    Patient will be independent with a long-term home exercise program  for self-management of symptoms.  Baseline: Initial HEP to be provided at visit 2 as appropriate (12/20/21); initial HEP provided visit 2 (12/26/2021); participating in HEP (01/23/2022); currently participating (03/15/2022);   Goal status: In-progress   2.  Patient will demonstrate improved FOTO to equal or greater than 67 by visit #11 to demonstrate improvement in overall condition and self-reported functional ability.  Baseline: 54 (12/20/21); 62 at visit # 10 (01/23/2022); 65 at visit #18 (03/15/2022); Goal status: In-progress   3.  Patient will ambulate equal or greater than 1200 feet on 6 Minute Walk Test with no increase in symptoms to demonstrate improved community ambulation.  Baseline: to be tested visit 2 as appropriate (12/20/21); 1,225' up to 2/10 NPS (12/26/2021); 1263 feet with no AD, no complaint of pain while walking. Pain at waist when going to sit after walking. R knee pain (01/23/2022); 1347 feet with no AD, stiffening at about  3 min. Pain at right scapula up to 4-5/10. R knee pain (03/15/2022);  Goal status: Met 01/23/2022   4.  Patient will demonstrate the ability to lift 25# box floor to waist 1x10 times in a row with no increase in symptoms to improve his ability to bend and lift things around his home.  Baseline: reports difficulty with bending and lifting items at home  (12/20/21); 3 reps at 25#. Reported pain with each lift at low back increasing to 4/10 at 25# with request to stop (01/23/2022); 1x10 reps at 25#. Reported pain with each lift at low back increasing to 5/10 at 25#. Incomplete stand and occasionally missing touching floor (03/15/2022);  Goal status: In-progress   5.  Patient will report the ability to stand/walk equal or greater than 60 min before needing to sit to improve his ability to stand in line, socialize, and cook.  Baseline: 5-20 min (12/20/21); on his feet 60 min with salon pass on his back making hashbrown casserole this morning before he started to get  pain, he feels he would not be able to do this without the salon pass (01/23/2022): Has not tested it recently, there is a party coming up where he will be cooking in November (03/15/2022);  Goal status: Nearly met.      PLAN: PT FREQUENCY: 1-2x/week   PT DURATION: 12 weeks   PLANNED INTERVENTIONS: Therapeutic exercises, Therapeutic activity, Neuromuscular re-education, Patient/Family education, Joint mobilization, DME instructions, Dry Needling, Electrical stimulation, Spinal mobilization, Cryotherapy, Moist heat, Manual therapy, and Re-evaluation.   PLAN FOR NEXT SESSION: update HEP as appropriate, manual therapy and exercises for improved joint mobility in the spine.  Everlean Alstrom. Graylon Good, PT, DPT 05/08/22, 3:55 PM  [facility] [address] [place], [place] [address] P: [phone] I F: [phone]

## 2022-05-15 ENCOUNTER — Encounter: Payer: Self-pay | Admitting: Physical Therapy

## 2022-05-15 ENCOUNTER — Ambulatory Visit: Payer: Medicare Other | Admitting: Dermatology

## 2022-05-15 ENCOUNTER — Ambulatory Visit: Payer: Medicare Other | Admitting: Physical Therapy

## 2022-05-15 VITALS — BP 135/88 | HR 60

## 2022-05-15 DIAGNOSIS — D171 Benign lipomatous neoplasm of skin and subcutaneous tissue of trunk: Secondary | ICD-10-CM | POA: Diagnosis not present

## 2022-05-15 DIAGNOSIS — L578 Other skin changes due to chronic exposure to nonionizing radiation: Secondary | ICD-10-CM | POA: Diagnosis not present

## 2022-05-15 DIAGNOSIS — R262 Difficulty in walking, not elsewhere classified: Secondary | ICD-10-CM

## 2022-05-15 DIAGNOSIS — Z86018 Personal history of other benign neoplasm: Secondary | ICD-10-CM

## 2022-05-15 DIAGNOSIS — L720 Epidermal cyst: Secondary | ICD-10-CM

## 2022-05-15 DIAGNOSIS — M5459 Other low back pain: Secondary | ICD-10-CM | POA: Diagnosis not present

## 2022-05-15 DIAGNOSIS — Z1283 Encounter for screening for malignant neoplasm of skin: Secondary | ICD-10-CM

## 2022-05-15 DIAGNOSIS — D179 Benign lipomatous neoplasm, unspecified: Secondary | ICD-10-CM

## 2022-05-15 DIAGNOSIS — R293 Abnormal posture: Secondary | ICD-10-CM | POA: Diagnosis not present

## 2022-05-15 DIAGNOSIS — M546 Pain in thoracic spine: Secondary | ICD-10-CM | POA: Diagnosis not present

## 2022-05-15 DIAGNOSIS — L814 Other melanin hyperpigmentation: Secondary | ICD-10-CM

## 2022-05-15 DIAGNOSIS — L820 Inflamed seborrheic keratosis: Secondary | ICD-10-CM

## 2022-05-15 DIAGNOSIS — D229 Melanocytic nevi, unspecified: Secondary | ICD-10-CM

## 2022-05-15 DIAGNOSIS — L821 Other seborrheic keratosis: Secondary | ICD-10-CM

## 2022-05-15 NOTE — Progress Notes (Signed)
Follow-Up Visit   Subjective  Daniel [name] is a 71 y.o. male who presents for the following: Annual Exam (Hx AK, dysplastic nevi ). The patient presents for Total-Body Skin Exam (TBSE) for skin cancer screening and mole check.  The patient has spots, moles and lesions to be evaluated, some may be new or changing and the patient has concerns that these could be cancer.  The following portions of the chart were reviewed this encounter and updated as appropriate:   Tobacco  Allergies  Meds  Problems  Med Hx  Surg Hx  Fam Hx     Review of Systems:  No other skin or systemic complaints except as noted in HPI or Assessment and Plan.  Objective  Well appearing patient in no apparent distress; mood and affect are within normal limits.  A full examination was performed including scalp, head, eyes, ears, nose, lips, neck, chest, axillae, abdomen, back, buttocks, bilateral upper extremities, bilateral lower extremities, hands, feet, fingers, toes, fingernails, and toenails. All findings within normal limits unless otherwise noted below.  R cheek/infraorbital x 1 Erythematous stuck-on, waxy papule or plaque  L cheek 1.0 cm firm SQ nodule.   R mid back 2.0 cm rubbery nodule.   Assessment & Plan  Inflamed seborrheic keratosis R cheek/infraorbital x 1 Symptomatic, irritating, patient would like treated. Destruction of lesion - R cheek/infraorbital x 1 Complexity: simple   Destruction method: cryotherapy   Informed consent: discussed and consent obtained   Timeout:  patient name, date of birth, surgical site, and procedure verified Lesion destroyed using liquid nitrogen: Yes   Region frozen until ice ball extended beyond lesion: Yes   Outcome: patient tolerated procedure well with no complications   Post-procedure details: wound care instructions given    Epidermal inclusion cyst - 1 cm L cheek Benign-appearing. Exam most consistent with an epidermal inclusion cyst. Discussed  that a cyst is a benign growth that can grow over time and sometimes get irritated or inflamed. Recommend observation if it is not bothersome. Discussed option of surgical excision to remove it if it is growing, symptomatic, or other changes noted. Please call for new or changing lesions so they can be evaluated.  Lipoma,  2 cm R mid back Recommend observation if it is not bothersome. Discussed option of surgical excision to remove it if it is growing, symptomatic, or other changes noted. Please call for new or changing lesions so they can be evaluated.  Lentigines - Scattered tan macules - Due to sun exposure - Benign-appearing, observe - Recommend daily broad spectrum sunscreen SPF 30+ to sun-exposed areas, reapply every 2 hours as needed. - Call for any changes  Seborrheic Keratoses - Stuck-on, waxy, tan-brown papules and/or plaques  - Benign-appearing - Discussed benign etiology and prognosis. - Observe - Call for any changes  Melanocytic Nevi - Tan-brown and/or pink-flesh-colored symmetric macules and papules - Benign appearing on exam today - Observation - Call clinic for new or changing moles - Recommend daily use of broad spectrum spf 30+ sunscreen to sun-exposed areas.   Hemangiomas - Red papules - Discussed benign nature - Observe - Call for any changes  Actinic Damage - Chronic condition, secondary to cumulative UV/sun exposure - diffuse scaly erythematous macules with underlying dyspigmentation - Recommend daily broad spectrum sunscreen SPF 30+ to sun-exposed areas, reapply every 2 hours as needed.  - Staying in the shade or wearing long sleeves, sun glasses (UVA+UVB protection) and wide brim hats (4-inch brim around the entire  circumference of the hat) are also recommended for sun protection.  - Call for new or changing lesions.  History of Dysplastic Nevi - No evidence of recurrence today - Recommend regular full body skin exams - Recommend daily broad  spectrum sunscreen SPF 30+ to sun-exposed areas, reapply every 2 hours as needed.  - Call if any new or changing lesions are noted between office visits  Skin cancer screening performed today.  Return in about 1 year (around 05/16/2023) for TBSE; surgery for cyst excision .  Luther Redo, CMA, am acting as scribe for Sarina Ser, MD . Documentation: I have reviewed the above documentation for accuracy and completeness, and I agree with the above.  Sarina Ser, MD

## 2022-05-15 NOTE — Therapy (Signed)
OUTPATIENT PHYSICAL THERAPY TREATMENT NOTE   Patient Name: Daniel [name] MRN: [id number] DOB:Jan 11, 1951, 71 y.o., male Today's Date: 05/15/2022  PCP: Jerrol Banana., MD REFERRING PROVIDER: Allene Dillon, NP  END OF SESSION:   PT End of Session - 05/15/22 1529     Visit Number 26    Number of Visits 41    Date for PT Re-Evaluation 06/07/22    Authorization Type [employer] MEDICARE reporting period from 03/29/2022    Progress Note Due on Visit 30    PT Start Time 1515    PT Stop Time 1555    PT Time Calculation (min) 40 min    Activity Tolerance Patient tolerated treatment well    Behavior During Therapy [facility] for tasks assessed/performed                  Past Medical History:  Diagnosis Date   Actinic keratosis    Asthma    Eczema    Hx of dysplastic nevus 06/06/2006   Mid lower back, slight atypia   Hypothyroidism    Seasonal allergies    Thyroid disease    Past Surgical History:  Procedure Laterality Date   CATARACT EXTRACTION     COLON SURGERY     COLONOSCOPY WITH PROPOFOL N/A 11/18/2014   Procedure: COLONOSCOPY WITH PROPOFOL;  Surgeon: [name], MD;  Location: [facility] ENDOSCOPY;  Service: Endoscopy;  Laterality: N/A;   COLONOSCOPY WITH PROPOFOL N/A 12/13/2021   Procedure: COLONOSCOPY WITH PROPOFOL;  Surgeon: [name], MD;  Location: [facility] ENDOSCOPY;  Service: Endoscopy;  Laterality: N/A;   EYE SURGERY     KNEE SURGERY     NASAL SEPTUM SURGERY     Patient Active Problem List   Diagnosis Date Noted   Paroxysmal atrial fibrillation ([place]) 03/28/2022   Idiopathic hypotension 03/28/2022   Spondylolysis, lumbar region 03/28/2022   Avitaminosis D 03/28/2022   Myalgia due to statin 03/28/2022   Acquired thrombophilia ([place]) 03/28/2022   Hyperlipidemia 03/28/2022   Abnormal gait 03/28/2022   Mild intermittent asthma without complication 27/74/1287   Zenker's (hypopharyngeal) diverticulum 03/22/2018   Gastroesophageal reflux disease  03/22/2018   Fatty liver 10/09/2017   BPH (benign prostatic hyperplasia) 11/21/2014   Hypothyroidism 11/21/2014   Inguinal hernia 11/21/2014   OA (osteoarthritis) 11/21/2014   H/O adenomatous polyp of colon 10/14/2014    REFERRING DIAG: lumbar DDD (degenerative disc disease), lumbar stenosis with neurogenic claudication, lumbar radiculitis  THERAPY DIAG:  Other low back pain  Pain in thoracic spine  Abnormal posture  Difficulty in walking, not elsewhere classified  Rationale for Evaluation and Treatment: Rehabilitation  PERTINENT HISTORY: Patient is a 71 y.o. male who presents to outpatient physical therapy with a referral for medical diagnosis lumbar DDD (degenerative disc disease, lumbar stenosis with neurogenic claudication, lumbar radiculitis. This patient's chief complaints consist of chronic pain in the low back and upper glutes that moves up towards his B scapulae with prolonged standing/walking and with intermittent shooting down his R left to the knee, leading to the following functional deficits: difficulty with anything that requires a lot of standing/walking, cooking for church/wine club, cleaning the house, waiting in line, standing longer than 5-20 min, having conversations with others while standing, yard work, Orthoptist, cleaning, sit <> stand, bending and lifting together, staining deck, shopping, walking more than 2 blocks, social participation, changing positions in the bed, golfing.    Relevant past medical history and comorbidities include asthma, Zenker's (hypopharyngeal) diverticulum, afib, GERD,  hx of fatty liver, hypothyroidism, BPH, OA, hx adeomatous polyp of colon, inguinal hernia, L knee pain, hx of dysplastic nevus, colon surgery (7 inches removed of colon), cataract extraction, L knee pain.  Patient denies hx of cancer, stroke, seizures, lung problem, diabetes, unexplained weight loss, unexplained changes in bowel or bladder problems, unexplained  stumbling or dropping things, spinal surgery. He has been exercising daily.     PRECAUTIONS: none  SUBJECTIVE: patient reports he has had low back pain for the last hour today but yesterday and today  his pain has mostly been just in the mornings. He states he felt okay after last PT session.   PAIN:  Are you having pain? NPRS 1/10 in low back  OBJECTIVE     TODAY'S TREATMENT  Therapeutic exercise: to centralize symptoms and improve ROM, strength, muscular endurance, and activity tolerance required for successful completion of functional activities. - standing frontal plane leg swings at TM bar with B UE support, 2x30 seconds each foot. Cuing for increased ROM, back movement.  - standing shoulder AAROM holding gait belt with both hands moving shoulders into flexion towards hyperflexion and back with hands spread as wide as needed to get belt overhead, 3x10, cuing for improved elbow extension.  - standing hip extension at hip machine from, height at position 4,  rotary position 3, 4x12/03/12/09 each side with B UE support at 70/115/145/160# (left limiting).  (Manual therapy - see below) - prone press up, 3x10 - prone scapular retraction with upper back extension and cervical spine retraction,  B UE at 90/90 position. 3x10. - seated on edge of plinth adjusted to allow him to sit with knees slightly bent: good morning with 9#DB on each shoulder, 3x10. Cuing for improved ROM, hips wider, and flat back. Utilized long foam roller for target for forehead to improve ROM.  - Standing mini-squat over 18 inch chair (Green) touching green theraball to ground, then overhead raises to tap green theraball stand overhead. 3x8. (reports leg fatigue). Cuing for complete ROM, and help with counting.  - standing horizontal abduction with RTB anchored on opposite side, 3x10 each side. Cuing for  ROM and UE position.   Manual therapy: to reduce pain and tissue tension, improve range of motion, neuromodulation, in  order to promote improved ability to complete functional activities. PRONE with face in table face cradle - CPA grade III-IV thoracic spine T2 through L5. ~ 20 per segment.  Pt required multimodal cuing for proper technique and to facilitate improved neuromuscular control, strength, range of motion, and functional ability resulting in improved performance and form.   PATIENT EDUCATION:  Education details: Exercise purpose/form. Self management techniques.  Reviewed cancelation/no-show policy with patient and confirmed patient has correct phone number for clinic; patient verbalized understanding (01/18/22). Person educated: Patient Education method: Explanation, demonstration, VC, TC Education comprehension: verbalized understanding, demonstrated understanding, and needs further education     HOME EXERCISE PROGRAM: Access Code: [email or web address] URL: [email or web address] Date: 02/08/2022 Prepared by: Rosita Kea  Exercises - Sidelying Thoracic Rotation with Open Book  - 1-2 x daily - 3 sets - 10 reps - 5 seconds hold - Bridge  - 1-2 x daily - 3 sets - 10 reps - 5 seconds hold - Supine Lower Trunk Rotation  - 1 x daily - 3 sets - 15 reps - Prone Press Up  - 1-2 x daily - 3 sets - 10 reps - Bilateral Bent Leg Lift  - 1-2 x daily - 3 sets - 10 reps -  Seated Thoracic Lumbar Extension  - Seated Flexion Stretch with Swiss Ball  - 1 x daily - 1 sets - 20 reps - 5 seconds hold - Baby Cobra Hands Hovering   - 1 x daily - 3 sets - 10 reps - 5 seconds hold   ASSESSMENT:   CLINICAL IMPRESSION: Patient arrives with mild pain and continued pain in the mornings. Continued with glute/LE/back strengthening and interventions for joint mobility and ROM. Patient feeling more fatigued than usual after being busy this weekend, but was able to continue with similar exercises. Continues to have trouble with stiffness, quick fatigue, and utilizing full available ROM for posture and other activities.  Patient continues to require PT guidance for exercise selection, improved ROM, form, and volume. He continues to benefit from continued manual intervention to improve joint stiffness. Patient would benefit from continued management of limiting condition by skilled physical therapist to address remaining impairments and functional limitations to work towards stated goals and return to PLOF or maximal functional independence.    From PT eval 12/20/2021:  Patient is a 71 y.o. male referred to outpatient physical therapy with a medical diagnosis of lumbar DDD (degenerative disc disease, lumbar stenosis with neurogenic claudication, lumbar radiculitis who presents with signs and symptoms consistent with chronic low back and thoracic spine pain and stiffness with chronic kyphotic postural changes, hip extension weakness, and intermittent radicular symptoms contributing. Patient's pattern of symptoms suggests sustained spinal extension intolerance and difficulty with postural endurance is likely contributing to condition. Previous episode of care was able to improve patient's quality of life some with decreased pain and improved function and patient has potential for improvement from his current state in these areas. Patient presents with significant pain, joint stiffness, posture, ROM, motor control, muscle performance (strength/power/endurance), and activity tolerance impairments that are limiting ability to complete his usual activities including anything that requires a lot of standing/walking, cooking for church/wine club, cleaning the house, waiting in line, standing longer than 5-20 min, having conversations with others while standing, yard work, Orthoptist, cleaning, sit <> stand, bending and lifting together, staining deck, shopping, walking more than 2 blocks, social participation, changing positions in the bed, golfing without difficulty. Patient will benefit from skilled physical therapy  intervention to address current body structure impairments and activity limitations to improve function and work towards goals set in current POC in order to return to prior level of function or maximal functional improvement.      OBJECTIVE IMPAIRMENTS Abnormal gait, decreased activity tolerance, decreased endurance, decreased mobility, difficulty walking, decreased ROM, decreased strength, hypomobility, impaired perceived functional ability, increased muscle spasms, impaired flexibility, improper body mechanics, postural dysfunction, and pain.    ACTIVITY LIMITATIONS carrying, lifting, bending, standing, bed mobility, dressing, and locomotion level   PARTICIPATION LIMITATIONS: meal prep, cleaning, laundry, interpersonal relationship, shopping, community activity, yard work, church, and   asthma, Zenker's (hypopharyngeal) diverticulum, afib, GERD, hx of fatty liver, hypothyroidism, BPH, OA, hx adeomatous polyp of colon, inguinal hernia, L knee pain, hx of dysplastic nevus, colon surgery (7 inches removed of colon), cataract extraction, L knee pain   PERSONAL FACTORS Age, Past/current experiences, Time since onset of injury/illness/exacerbation, and 3+ comorbidities:   asthma, Zenker's (hypopharyngeal) diverticulum, afib, GERD, hx of fatty liver, hypothyroidism, BPH, OA, hx adeomatous polyp of colon, inguinal hernia, L knee pain, hx of dysplastic nevus, colon surgery (7 inches removed of colon), cataract extraction, L knee pain are also affecting patient's functional outcome.    REHAB POTENTIAL: Good  CLINICAL DECISION MAKING: Stable/uncomplicated   EVALUATION COMPLEXITY: Low     GOALS: Goals reviewed with patient? yes   SHORT TERM GOALS: Target date: 01/03/2022   Patient will be independent with initial home exercise program for self-management of symptoms. Baseline: Initial HEP to be provided at visit 2 as appropriate (12/20/21); initial HEP provided visit 2 (12/26/2021);  Goal status: met     LONG TERM GOALS: Target date: 03/14/2022. UPDATED to 06/07/2022 for all unmet goals on 03/15/2022.    Patient will be independent with a long-term home exercise program for self-management of symptoms.  Baseline: Initial HEP to be provided at visit 2 as appropriate (12/20/21); initial HEP provided visit 2 (12/26/2021); participating in HEP (01/23/2022); currently participating (03/15/2022);   Goal status: In-progress   2.  Patient will demonstrate improved FOTO to equal or greater than 67 by visit #11 to demonstrate improvement in overall condition and self-reported functional ability.  Baseline: 54 (12/20/21); 62 at visit # 10 (01/23/2022); 65 at visit #18 (03/15/2022); Goal status: In-progress   3.  Patient will ambulate equal or greater than 1200 feet on 6 Minute Walk Test with no increase in symptoms to demonstrate improved community ambulation.  Baseline: to be tested visit 2 as appropriate (12/20/21); 1,225' up to 2/10 NPS (12/26/2021); 1263 feet with no AD, no complaint of pain while walking. Pain at waist when going to sit after walking. R knee pain (01/23/2022); 1347 feet with no AD, stiffening at about  3 min. Pain at right scapula up to 4-5/10. R knee pain (03/15/2022);  Goal status: Met 01/23/2022   4.  Patient will demonstrate the ability to lift 25# box floor to waist 1x10 times in a row with no increase in symptoms to improve his ability to bend and lift things around his home.  Baseline: reports difficulty with bending and lifting items at home  (12/20/21); 3 reps at 25#. Reported pain with each lift at low back increasing to 4/10 at 25# with request to stop (01/23/2022); 1x10 reps at 25#. Reported pain with each lift at low back increasing to 5/10 at 25#. Incomplete stand and occasionally missing touching floor (03/15/2022);  Goal status: In-progress   5.  Patient will report the ability to stand/walk equal or greater than 60 min before needing to sit to improve his ability to stand in  line, socialize, and cook.  Baseline: 5-20 min (12/20/21); on his feet 60 min with salon pass on his back making hashbrown casserole this morning before he started to get pain, he feels he would not be able to do this without the salon pass (01/23/2022): Has not tested it recently, there is a party coming up where he will be cooking in November (03/15/2022);  Goal status: Nearly met.      PLAN: PT FREQUENCY: 1-2x/week   PT DURATION: 12 weeks   PLANNED INTERVENTIONS: Therapeutic exercises, Therapeutic activity, Neuromuscular re-education, Patient/Family education, Joint mobilization, DME instructions, Dry Needling, Electrical stimulation, Spinal mobilization, Cryotherapy, Moist heat, Manual therapy, and Re-evaluation.   PLAN FOR NEXT SESSION: update HEP as appropriate, manual therapy and exercises for improved joint mobility in the spine.  Everlean Alstrom. Graylon Good, PT, DPT 05/15/22, 3:51 PM  [facility] [address] [place],  [address] P: [phone] I F: [phone]

## 2022-05-15 NOTE — Patient Instructions (Addendum)
 Pre-Operative Instructions  You are scheduled for a surgical procedure at [facility]. We recommend you read the following instructions. If you have any questions or concerns, please call the office at [phone].  Shower and wash the entire body with soap and water the day of your surgery paying special attention to cleansing at and around the planned surgery site.  Avoid aspirin or aspirin containing products at least fourteen (14) days prior to your surgical procedure and for at least one week (7 Days) after your surgical procedure. If you take aspirin on a regular basis for heart disease or history of stroke or for any other reason, we may recommend you continue taking aspirin but please notify us if you take this on a regular basis. Aspirin can cause more bleeding to occur during surgery as well as prolonged bleeding and bruising after surgery.   Avoid other nonsteroidal pain medications at least one week prior to surgery and at least one week prior to your surgery. These include medications such as Ibuprofen (Motrin, Advil and Nuprin), Naprosyn, Voltaren, Relafen, etc. If medications are used for therapeutic reasons, please inform us as they can cause increased bleeding or prolonged bleeding during and bruising after surgical procedures.   Please advise us if you are taking any "blood thinner" medications such as Coumadin or Dipyridamole or Plavix or similar medications. These cause increased bleeding and prolonged bleeding during procedures and bruising after surgical procedures. We may have to consider discontinuing these medications briefly prior to and shortly after your surgery if safe to do so.   Please inform us of all medications you are currently taking. All medications that are taken regularly should be taken the day of surgery as you always do. Nevertheless, we need to be informed of what medications you are taking prior to surgery to know whether they will affect the  procedure or cause any complications.   Please inform us of any medication allergies. Also inform us of whether you have allergies to Latex or rubber products or whether you have had any adverse reaction to Lidocaine or Epinephrine.  Please inform us of any prosthetic or artificial body parts such as artificial heart valve, joint replacements, etc., or similar condition that might require preoperative antibiotics.   We recommend avoidance of alcohol at least two weeks prior to surgery and continued avoidance for at least two weeks after surgery.   We recommend discontinuation of tobacco smoking at least two weeks prior to surgery and continued abstinence for at least two weeks after surgery.  Do not plan strenuous exercise, strenuous work or strenuous lifting for approximately four weeks after your surgery.   We request if you are unable to make your scheduled surgical appointment, please call us at least a week in advance or as soon as you are aware of a problem so that we can cancel or reschedule the appointment.   You MAY TAKE TYLENOL (acetaminophen) for pain as it is not a blood thinner.   PLEASE PLAN TO BE IN TOWN FOR TWO WEEKS FOLLOWING SURGERY, THIS IS IMPORTANT SO YOU CAN BE CHECKED FOR DRESSING CHANGES, SUTURE REMOVAL AND TO MONITOR FOR POSSIBLE COMPLICATIONS.    Due to recent changes in healthcare laws, you may see results of your pathology and/or laboratory studies on MyChart before the doctors have had a chance to review them. We understand that in some cases there may be results that are confusing or concerning to you. Please understand that not all results are   received at the same time and often the doctors may need to interpret multiple results in order to provide you with the best plan of care or course of treatment. Therefore, we ask that you please give us 2 business days to thoroughly review all your results before contacting the office for clarification. Should we see a  critical lab result, you will be contacted sooner.   If You Need Anything After Your Visit  If you have any questions or concerns for your doctor, please call our main line at [phone] and press option 4 to reach your doctor's medical assistant. If no one answers, please leave a voicemail as directed and we will return your call as soon as possible. Messages left after 4 pm will be answered the following business day.   You may also send us a message via MyChart. We typically respond to MyChart messages within 1-2 business days.  For prescription refills, please ask your pharmacy to contact our office. Our fax number is [phone].  If you have an urgent issue when the clinic is closed that cannot wait until the next business day, you can page your doctor at the number below.    Please note that while we do our best to be available for urgent issues outside of office hours, we are not available 24/7.   If you have an urgent issue and are unable to reach us, you may choose to seek medical care at your doctor's office, retail clinic, urgent care center, or emergency room.  If you have a medical emergency, please immediately call 911 or go to the emergency department.  Pager Numbers  - Dr. Kowalski: [phone]  - Dr. Moye: [phone]  - Dr. Stewart: [phone]  In the event of inclement weather, please call our main line at [phone] for an update on the status of any delays or closures.  Dermatology Medication Tips: Please keep the boxes that topical medications come in in order to help keep track of the instructions about where and how to use these. Pharmacies typically print the medication instructions only on the boxes and not directly on the medication tubes.   If your medication is too expensive, please contact our office at [phone] option 4 or send us a message through MyChart.   We are unable to tell what your co-pay for medications will be in advance as  this is different depending on your insurance coverage. However, we may be able to find a substitute medication at lower cost or fill out paperwork to get insurance to cover a needed medication.   If a prior authorization is required to get your medication covered by your insurance company, please allow us 1-2 business days to complete this process.  Drug prices often vary depending on where the prescription is filled and some pharmacies may offer cheaper prices.  The website www.goodrx.com contains coupons for medications through different pharmacies. The prices here do not account for what the cost may be with help from insurance (it may be cheaper with your insurance), but the website can give you the price if you did not use any insurance.  - You can print the associated coupon and take it with your prescription to the pharmacy.  - You may also stop by our office during regular business hours and pick up a GoodRx coupon card.  - If you need your prescription sent electronically to a different pharmacy, notify our office through [facility] MyChart or by phone at [phone] option 4.       Si Usted Necesita Algo Despus de Su Visita  Tambin puede enviarnos un mensaje a travs de MyChart. Por lo general respondemos a los mensajes de MyChart en el transcurso de 1 a 2 das hbiles.  Para renovar recetas, por favor pida a su farmacia que se ponga en contacto con nuestra oficina. Nuestro nmero de fax es el [phone].  Si tiene un asunto urgente cuando la clnica est cerrada y que no puede esperar hasta el siguiente da hbil, puede llamar/localizar a su doctor(a) al nmero que aparece a continuacin.   Por favor, tenga en cuenta que aunque hacemos todo lo posible para estar disponibles para asuntos urgentes fuera del horario de oficina, no estamos disponibles las 24 horas del da, los 7 das de la semana.   Si tiene un problema urgente y no puede comunicarse con nosotros, puede optar por  buscar atencin mdica  en el consultorio de su doctor(a), en una clnica privada, en un centro de atencin urgente o en una sala de emergencias.  Si tiene una emergencia mdica, por favor llame inmediatamente al 911 o vaya a la sala de emergencias.  Nmeros de bper  - Dr. Kowalski: [phone]  - Dra. Moye: [phone]  - Dra. Stewart: [phone]  En caso de inclemencias del tiempo, por favor llame a nuestra lnea principal al [phone] para una actualizacin sobre el estado de cualquier retraso o cierre.  Consejos para la medicacin en dermatologa: Por favor, guarde las cajas en las que vienen los medicamentos de uso tpico para ayudarle a seguir las instrucciones sobre dnde y cmo usarlos. Las farmacias generalmente imprimen las instrucciones del medicamento slo en las cajas y no directamente en los tubos del medicamento.   Si su medicamento es muy caro, por favor, pngase en contacto con nuestra oficina llamando al [phone] y presione la opcin 4 o envenos un mensaje a travs de MyChart.   No podemos decirle cul ser su copago por los medicamentos por adelantado ya que esto es diferente dependiendo de la cobertura de su seguro. Sin embargo, es posible que podamos encontrar un medicamento sustituto a menor costo o llenar un formulario para que el seguro cubra el medicamento que se considera necesario.   Si se requiere una autorizacin previa para que su compaa de seguros cubra su medicamento, por favor permtanos de 1 a 2 das hbiles para completar este proceso.  Los precios de los medicamentos varan con frecuencia dependiendo del lugar de dnde se surte la receta y alguna farmacias pueden ofrecer precios ms baratos.  El sitio web www.goodrx.com tiene cupones para medicamentos de diferentes farmacias. Los precios aqu no tienen en cuenta lo que podra costar con la ayuda del seguro (puede ser ms barato con su seguro), pero el sitio web puede darle el precio si no  utiliz ningn seguro.  - Puede imprimir el cupn correspondiente y llevarlo con su receta a la farmacia.  - Tambin puede pasar por nuestra oficina durante el horario de atencin regular y recoger una tarjeta de cupones de GoodRx.  - Si necesita que su receta se enve electrnicamente a una farmacia diferente, informe a nuestra oficina a travs de MyChart de [place] o por telfono llamando al [phone] y presione la opcin 4.  

## 2022-05-16 ENCOUNTER — Ambulatory Visit (LOCAL_COMMUNITY_HEALTH_CENTER): Payer: Medicare Other

## 2022-05-16 DIAGNOSIS — Z23 Encounter for immunization: Secondary | ICD-10-CM | POA: Diagnosis not present

## 2022-05-16 DIAGNOSIS — Z719 Counseling, unspecified: Secondary | ICD-10-CM

## 2022-05-16 NOTE — Progress Notes (Signed)
  Are you feeling sick today? No   Have you ever received a dose of COVID-19 Vaccine? [employer], [place], [place], [place], Other) Yes  If yes, which vaccine and how many doses?   4 doses Pfizer   Did you bring the vaccination record card or other documentation?  Yes   Do you have a health condition or are undergoing treatment that makes you moderately or severely immunocompromised? This would include, but not be limited to: cancer, HIV, organ transplant, immunosuppressive therapy/high-dose corticosteroids, or moderate/severe primary immunodeficiency.  No  Have you received COVID-19 vaccine before or during hematopoietic cell transplant (HCT) or CAR-T-cell therapies? No  Have you ever had an allergic reaction to: (This would include a severe allergic reaction or a reaction that caused hives, swelling, or respiratory distress, including wheezing.) A component of a COVID-19 vaccine or a previous dose of COVID-19 vaccine? No   Have you ever had an allergic reaction to another vaccine (other thanCOVID-19 vaccine) or an injectable medication? (This would include a severe allergic reaction or a reaction that caused hives, swelling, or respiratory distress, including wheezing.)   No    Do you have a history of any of the following:  Myocarditis or Pericarditis No  Dermal fillers:  No  Multisystem Inflammatory Syndrome (MIS-C or MIS-A)? No  COVID-19 disease within the past 3 months? No  Vaccinated with monkeypox vaccine in the last 4 weeks? No   VIS provided. Comirnaty +12Y [id number] IM in right deltoid.  Tolerated well. Waited 15 minutes. [facility] updated and copy provided.

## 2022-05-17 ENCOUNTER — Ambulatory Visit: Payer: Medicare Other | Admitting: Physical Therapy

## 2022-05-18 ENCOUNTER — Ambulatory Visit: Payer: Medicare Other | Admitting: Physical Therapy

## 2022-05-26 ENCOUNTER — Encounter: Payer: Self-pay | Admitting: Dermatology

## 2022-05-26 NOTE — Progress Notes (Deleted)
      Established patient visit   Patient: Daniel [name]   DOB: 1950/08/01   71 y.o. Male  MRN: [id number] Visit Date: 06/01/2022  Today's healthcare provider: Lavon Paganini, MD   No chief complaint on file.  Subjective    HPI  Follow up for ***  The patient was last seen for this {NUMBERS 1-12:18279} {days/wks/mos/yrs:310907} ago. Changes made at last visit include ***.  He reports {excellent/good/fair/poor:19665} compliance with treatment. He feels that condition is {improved/worse/unchanged:3041574}. He {is/is not:21021397} having side effects. ***  -----------------------------------------------------------------------------------------   Medications: Outpatient Medications Prior to Visit  Medication Sig   albuterol (VENTOLIN HFA) 108 (90 Base) MCG/ACT inhaler Inhale 2 puffs into the lungs every 6 (six) hours as needed for wheezing or shortness of breath.   ELIQUIS 5 MG TABS tablet Take 5 mg by mouth 2 (two) times daily.   gabapentin (NEURONTIN) 100 MG capsule Take 300 mg by mouth at bedtime.   levothyroxine (SYNTHROID) 100 MCG tablet TAKE 1 TABLET BY MOUTH EVERY DAY BEFORE BREAKFAST   metoprolol succinate (TOPROL-XL) 25 MG 24 hr tablet Take 25 mg by mouth daily.   pantoprazole (PROTONIX) 40 MG tablet TAKE 1 TABLET(40 MG) BY MOUTH TWICE DAILY   predniSONE (DELTASONE) 20 MG tablet Take 1 tablet (20 mg total) by mouth daily with breakfast.   traMADol (ULTRAM) 50 MG tablet Take 50 mg by mouth every 6 (six) hours as needed.   No facility-administered medications prior to visit.    Review of Systems  {Labs  Heme  Chem  Endocrine  Serology  Results Review (optional):23779}   Objective    There were no vitals taken for this visit. {Show previous vital signs (optional):23777}  Physical Exam  ***  No results found for any visits on 06/01/22.  Assessment & Plan     ***  No follow-ups on file.      {provider attestation***:1}   Lavon Paganini, MD   [facility] [phone] (phone) [phone] (fax)  [place]

## 2022-05-30 ENCOUNTER — Encounter: Payer: Self-pay | Admitting: Dermatology

## 2022-05-30 ENCOUNTER — Ambulatory Visit: Payer: Medicare Other | Admitting: Family Medicine

## 2022-05-30 DIAGNOSIS — M47816 Spondylosis without myelopathy or radiculopathy, lumbar region: Secondary | ICD-10-CM | POA: Diagnosis not present

## 2022-05-30 DIAGNOSIS — M5136 Other intervertebral disc degeneration, lumbar region: Secondary | ICD-10-CM | POA: Diagnosis not present

## 2022-05-30 DIAGNOSIS — M48062 Spinal stenosis, lumbar region with neurogenic claudication: Secondary | ICD-10-CM | POA: Diagnosis not present

## 2022-05-30 DIAGNOSIS — M5416 Radiculopathy, lumbar region: Secondary | ICD-10-CM | POA: Diagnosis not present

## 2022-05-31 ENCOUNTER — Ambulatory Visit: Payer: Medicare Other | Attending: Family Medicine | Admitting: Physical Therapy

## 2022-05-31 ENCOUNTER — Encounter: Payer: Self-pay | Admitting: Physical Therapy

## 2022-05-31 DIAGNOSIS — R262 Difficulty in walking, not elsewhere classified: Secondary | ICD-10-CM | POA: Diagnosis not present

## 2022-05-31 DIAGNOSIS — R293 Abnormal posture: Secondary | ICD-10-CM | POA: Diagnosis not present

## 2022-05-31 DIAGNOSIS — M545 Low back pain, unspecified: Secondary | ICD-10-CM | POA: Insufficient documentation

## 2022-05-31 DIAGNOSIS — M546 Pain in thoracic spine: Secondary | ICD-10-CM | POA: Diagnosis not present

## 2022-05-31 DIAGNOSIS — M5459 Other low back pain: Secondary | ICD-10-CM

## 2022-05-31 DIAGNOSIS — G8929 Other chronic pain: Secondary | ICD-10-CM | POA: Diagnosis not present

## 2022-05-31 NOTE — Therapy (Signed)
OUTPATIENT PHYSICAL THERAPY TREATMENT NOTE   Patient Name: Daniel [name] MRN: [id number] DOB:[birth year], 72 y.o., male Today's Date: 05/31/2022  PCP: Jerrol Banana., MD REFERRING PROVIDER: Allene Dillon, NP  END OF SESSION:   PT End of Session - 05/31/22 1559     Visit Number 27    Number of Visits 41    Date for PT Re-Evaluation 06/07/22    Authorization Type [employer] MEDICARE reporting period from 03/29/2022    Progress Note Due on Visit 30    PT Start Time 1600    PT Stop Time 1640    PT Time Calculation (min) 40 min    Activity Tolerance Patient tolerated treatment well    Behavior During Therapy [facility] for tasks assessed/performed                  Past Medical History:  Diagnosis Date   Actinic keratosis    Asthma    Eczema    Hx of dysplastic nevus 06/06/2006   Mid lower back, slight atypia   Hypothyroidism    Seasonal allergies    Thyroid disease    Past Surgical History:  Procedure Laterality Date   CATARACT EXTRACTION     COLON SURGERY     COLONOSCOPY WITH PROPOFOL N/A 11/18/2014   Procedure: COLONOSCOPY WITH PROPOFOL;  Surgeon: [name], MD;  Location: [facility] ENDOSCOPY;  Service: Endoscopy;  Laterality: N/A;   COLONOSCOPY WITH PROPOFOL N/A 12/13/2021   Procedure: COLONOSCOPY WITH PROPOFOL;  Surgeon: [name], MD;  Location: [facility] ENDOSCOPY;  Service: Endoscopy;  Laterality: N/A;   EYE SURGERY     KNEE SURGERY     NASAL SEPTUM SURGERY     Patient Active Problem List   Diagnosis Date Noted   Paroxysmal atrial fibrillation ([place]) 03/28/2022   Idiopathic hypotension 03/28/2022   Spondylolysis, lumbar region 03/28/2022   Avitaminosis D 03/28/2022   Myalgia due to statin 03/28/2022   Acquired thrombophilia ([place]) 03/28/2022   Hyperlipidemia 03/28/2022   Abnormal gait 03/28/2022   Mild intermittent asthma without complication 84/16/6063   Zenker's (hypopharyngeal) diverticulum 03/22/2018   Gastroesophageal reflux disease 03/22/2018    Fatty liver 10/09/2017   BPH (benign prostatic hyperplasia) 11/21/2014   Hypothyroidism 11/21/2014   Inguinal hernia 11/21/2014   OA (osteoarthritis) 11/21/2014   H/O adenomatous polyp of colon 10/14/2014    REFERRING DIAG: lumbar DDD (degenerative disc disease), lumbar stenosis with neurogenic claudication, lumbar radiculitis  THERAPY DIAG:  Other low back pain  Pain in thoracic spine  Abnormal posture  Difficulty in walking, not elsewhere classified  Rationale for Evaluation and Treatment: Rehabilitation  PERTINENT HISTORY: Patient is a 72 y.o. male who presents to outpatient physical therapy with a referral for medical diagnosis lumbar DDD (degenerative disc disease, lumbar stenosis with neurogenic claudication, lumbar radiculitis. This patient's chief complaints consist of chronic pain in the low back and upper glutes that moves up towards his B scapulae with prolonged standing/walking and with intermittent shooting down his R left to the knee, leading to the following functional deficits: difficulty with anything that requires a lot of standing/walking, cooking for church/wine club, cleaning the house, waiting in line, standing longer than 5-20 min, having conversations with others while standing, yard work, Orthoptist, cleaning, sit <> stand, bending and lifting together, staining deck, shopping, walking more than 2 blocks, social participation, changing positions in the bed, golfing.    Relevant past medical history and comorbidities include asthma, Zenker's (hypopharyngeal) diverticulum, afib, GERD,  hx of fatty liver, hypothyroidism, BPH, OA, hx adeomatous polyp of colon, inguinal hernia, L knee pain, hx of dysplastic nevus, colon surgery (7 inches removed of colon), cataract extraction, L knee pain.  Patient denies hx of cancer, stroke, seizures, lung problem, diabetes, unexplained weight loss, unexplained changes in bowel or bladder problems, unexplained stumbling or  dropping things, spinal surgery. He has been exercising daily.     PRECAUTIONS: none  SUBJECTIVE: Patient reports he was doing pretty well until a few days ago when he started having a lot more pain so he could barely walk in the morning. He saw Allene Dillon, FNP yesterday who increased his pain medication and that has been helping. Loree Fee is planning to see him again in 6 months and they will talk about getting a spinal stimulator.   PAIN:  Are you having pain? NPRS 2/10 in low back  OBJECTIVE     TODAY'S TREATMENT  Therapeutic exercise: to centralize symptoms and improve ROM, strength, muscular endurance, and activity tolerance required for successful completion of functional activities. - standing frontal plane leg swings at TM bar with B UE support, 3x30 seconds each foot. Cuing for increased ROM, back movement.  - standing against wall shoulder AAROM holding gait belt with both hands moving shoulders into flexion towards hyperflexion and back with hands spread as wide as needed to get belt overhead, 3x10, cuing for improved ROM.  - standing hip extension at hip machine from, height at position 4,  rotary position 3, shaft 1) 4x12/03/08/11 each side with B UE support at 85/115/10/160# (left limiting). Cuing for form.  - standing lumbar extension over TM bar, 2x10 (painful at first but improved with reps).  (Manual therapy - see below) - prone press up, 3x15 - prone scapular retraction with upper back extension and cervical spine retraction,  hands at side 3x10. Cuing for improved scapular motion.  - seated on edge of plinth adjusted to allow him to sit with knees slightly bent: good morning 1x4 with 9#DB on each shoulder (dropped to 5#DB 1x10 for second set, then discontinued due to increasing pain). Cuing for improved ROM, hips wider, and flat back. Utilized long foam roller for target for forehead to improve ROM.   Manual therapy: to reduce pain and tissue tension, improve range of  motion, neuromodulation, in order to promote improved ability to complete functional activities. PRONE with face in table face cradle - CPA grade III-IV thoracic spine T2 through L5. ~ 20-30 per segment. - L UPA and rib springing over L lower ribs and lower thoracic spine.  - STM to left QL and lumbar paraspinals in tender region.   Pt required multimodal cuing for proper technique and to facilitate improved neuromuscular control, strength, range of motion, and functional ability resulting in improved performance and form.   PATIENT EDUCATION:  Education details: Exercise purpose/form. Self management techniques.  Reviewed cancelation/no-show policy with patient and confirmed patient has correct phone number for clinic; patient verbalized understanding (01/18/22). Person educated: Patient Education method: Explanation, demonstration, VC, TC Education comprehension: verbalized understanding, demonstrated understanding, and needs further education     HOME EXERCISE PROGRAM: Access Code: [email or web address] URL: [email or web address] Date: 02/08/2022 Prepared by: Rosita Kea  Exercises - Sidelying Thoracic Rotation with Open Book  - 1-2 x daily - 3 sets - 10 reps - 5 seconds hold - Bridge  - 1-2 x daily - 3 sets - 10 reps - 5 seconds hold - Supine Lower Trunk Rotation  - 1 x daily -  3 sets - 15 reps - Prone Press Up  - 1-2 x daily - 3 sets - 10 reps - Bilateral Bent Leg Lift  - 1-2 x daily - 3 sets - 10 reps - Seated Thoracic Lumbar Extension  - Seated Flexion Stretch with Swiss Ball  - 1 x daily - 1 sets - 20 reps - 5 seconds hold - Baby Cobra Hands Hovering   - 1 x daily - 3 sets - 10 reps - 5 seconds hold   ASSESSMENT:   CLINICAL IMPRESSION: Patient arrives with mild pain but report of increased pain recently. He continues to have difficulty with full movements and maintaining upright posture. He had increased pain in his lower left flank region with pressure to the lower ribs  and with loaded flexion. He felt a little better after several extension reps. He also experienced some instability in his R knee while standing on it while performing hip extensions on the hip machine. He was able to continue with modifications and reported no increased pain by end of session. Patient would benefit from continued management of limiting condition by skilled physical therapist to address remaining impairments and functional limitations to work towards stated goals and return to PLOF or maximal functional independence.    From PT eval 12/20/2021:  Patient is a 72 y.o. male referred to outpatient physical therapy with a medical diagnosis of lumbar DDD (degenerative disc disease, lumbar stenosis with neurogenic claudication, lumbar radiculitis who presents with signs and symptoms consistent with chronic low back and thoracic spine pain and stiffness with chronic kyphotic postural changes, hip extension weakness, and intermittent radicular symptoms contributing. Patient's pattern of symptoms suggests sustained spinal extension intolerance and difficulty with postural endurance is likely contributing to condition. Previous episode of care was able to improve patient's quality of life some with decreased pain and improved function and patient has potential for improvement from his current state in these areas. Patient presents with significant pain, joint stiffness, posture, ROM, motor control, muscle performance (strength/power/endurance), and activity tolerance impairments that are limiting ability to complete his usual activities including anything that requires a lot of standing/walking, cooking for church/wine club, cleaning the house, waiting in line, standing longer than 5-20 min, having conversations with others while standing, yard work, Orthoptist, cleaning, sit <> stand, bending and lifting together, staining deck, shopping, walking more than 2 blocks, social participation,  changing positions in the bed, golfing without difficulty. Patient will benefit from skilled physical therapy intervention to address current body structure impairments and activity limitations to improve function and work towards goals set in current POC in order to return to prior level of function or maximal functional improvement.      OBJECTIVE IMPAIRMENTS Abnormal gait, decreased activity tolerance, decreased endurance, decreased mobility, difficulty walking, decreased ROM, decreased strength, hypomobility, impaired perceived functional ability, increased muscle spasms, impaired flexibility, improper body mechanics, postural dysfunction, and pain.    ACTIVITY LIMITATIONS carrying, lifting, bending, standing, bed mobility, dressing, and locomotion level   PARTICIPATION LIMITATIONS: meal prep, cleaning, laundry, interpersonal relationship, shopping, community activity, yard work, church, and   asthma, Zenker's (hypopharyngeal) diverticulum, afib, GERD, hx of fatty liver, hypothyroidism, BPH, OA, hx adeomatous polyp of colon, inguinal hernia, L knee pain, hx of dysplastic nevus, colon surgery (7 inches removed of colon), cataract extraction, L knee pain   PERSONAL FACTORS Age, Past/current experiences, Time since onset of injury/illness/exacerbation, and 3+ comorbidities:   asthma, Zenker's (hypopharyngeal) diverticulum, afib, GERD, hx of fatty liver,  hypothyroidism, BPH, OA, hx adeomatous polyp of colon, inguinal hernia, L knee pain, hx of dysplastic nevus, colon surgery (7 inches removed of colon), cataract extraction, L knee pain are also affecting patient's functional outcome.    REHAB POTENTIAL: Good   CLINICAL DECISION MAKING: Stable/uncomplicated   EVALUATION COMPLEXITY: Low     GOALS: Goals reviewed with patient? yes   SHORT TERM GOALS: Target date: 01/03/2022   Patient will be independent with initial home exercise program for self-management of symptoms. Baseline: Initial HEP to be  provided at visit 2 as appropriate (12/20/21); initial HEP provided visit 2 (12/26/2021);  Goal status: met    LONG TERM GOALS: Target date: 03/14/2022. UPDATED to 06/07/2022 for all unmet goals on 03/15/2022.    Patient will be independent with a long-term home exercise program for self-management of symptoms.  Baseline: Initial HEP to be provided at visit 2 as appropriate (12/20/21); initial HEP provided visit 2 (12/26/2021); participating in HEP (01/23/2022); currently participating (03/15/2022);   Goal status: In-progress   2.  Patient will demonstrate improved FOTO to equal or greater than 67 by visit #11 to demonstrate improvement in overall condition and self-reported functional ability.  Baseline: 54 (12/20/21); 62 at visit # 10 (01/23/2022); 65 at visit #18 (03/15/2022); Goal status: In-progress   3.  Patient will ambulate equal or greater than 1200 feet on 6 Minute Walk Test with no increase in symptoms to demonstrate improved community ambulation.  Baseline: to be tested visit 2 as appropriate (12/20/21); 1,225' up to 2/10 NPS (12/26/2021); 1263 feet with no AD, no complaint of pain while walking. Pain at waist when going to sit after walking. R knee pain (01/23/2022); 1347 feet with no AD, stiffening at about  3 min. Pain at right scapula up to 4-5/10. R knee pain (03/15/2022);  Goal status: Met 01/23/2022   4.  Patient will demonstrate the ability to lift 25# box floor to waist 1x10 times in a row with no increase in symptoms to improve his ability to bend and lift things around his home.  Baseline: reports difficulty with bending and lifting items at home  (12/20/21); 3 reps at 25#. Reported pain with each lift at low back increasing to 4/10 at 25# with request to stop (01/23/2022); 1x10 reps at 25#. Reported pain with each lift at low back increasing to 5/10 at 25#. Incomplete stand and occasionally missing touching floor (03/15/2022);  Goal status: In-progress   5.  Patient will report  the ability to stand/walk equal or greater than 60 min before needing to sit to improve his ability to stand in line, socialize, and cook.  Baseline: 5-20 min (12/20/21); on his feet 60 min with salon pass on his back making hashbrown casserole this morning before he started to get pain, he feels he would not be able to do this without the salon pass (01/23/2022): Has not tested it recently, there is a party coming up where he will be cooking in November (03/15/2022);  Goal status: Nearly met.      PLAN: PT FREQUENCY: 1-2x/week   PT DURATION: 12 weeks   PLANNED INTERVENTIONS: Therapeutic exercises, Therapeutic activity, Neuromuscular re-education, Patient/Family education, Joint mobilization, DME instructions, Dry Needling, Electrical stimulation, Spinal mobilization, Cryotherapy, Moist heat, Manual therapy, and Re-evaluation.   PLAN FOR NEXT SESSION: update HEP as appropriate, manual therapy and exercises for improved joint mobility in the spine.  Everlean Alstrom. Graylon Good, PT, DPT 05/31/22, 5:12 PM  [facility] [address]  [place], [place] [address] P: [phone] I F: [phone]

## 2022-06-01 ENCOUNTER — Ambulatory Visit: Payer: Medicare Other | Admitting: Family Medicine

## 2022-06-05 ENCOUNTER — Ambulatory Visit: Payer: Medicare Other | Admitting: Physical Therapy

## 2022-06-05 ENCOUNTER — Ambulatory Visit (INDEPENDENT_AMBULATORY_CARE_PROVIDER_SITE_OTHER): Payer: Medicare Other | Admitting: Family Medicine

## 2022-06-05 ENCOUNTER — Encounter: Payer: Self-pay | Admitting: Family Medicine

## 2022-06-05 VITALS — BP 127/79 | HR 56 | Temp 97.7°F | Resp 16 | Wt 212.0 lb

## 2022-06-05 DIAGNOSIS — K5904 Chronic idiopathic constipation: Secondary | ICD-10-CM | POA: Diagnosis not present

## 2022-06-05 DIAGNOSIS — M545 Low back pain, unspecified: Secondary | ICD-10-CM | POA: Diagnosis not present

## 2022-06-05 DIAGNOSIS — R1032 Left lower quadrant pain: Secondary | ICD-10-CM | POA: Diagnosis not present

## 2022-06-05 DIAGNOSIS — I480 Paroxysmal atrial fibrillation: Secondary | ICD-10-CM | POA: Diagnosis not present

## 2022-06-05 DIAGNOSIS — G20C Parkinsonism, unspecified: Secondary | ICD-10-CM | POA: Diagnosis not present

## 2022-06-05 DIAGNOSIS — E538 Deficiency of other specified B group vitamins: Secondary | ICD-10-CM | POA: Diagnosis not present

## 2022-06-05 DIAGNOSIS — R269 Unspecified abnormalities of gait and mobility: Secondary | ICD-10-CM | POA: Diagnosis not present

## 2022-06-05 NOTE — Assessment & Plan Note (Signed)
Resolved May have been 2/2 constipation (see plan above) Reassured patient that it was not diverticulitis as it has resolved without any abx or intervention

## 2022-06-05 NOTE — Assessment & Plan Note (Signed)
Longstanding Worse in the setting of tramadol use Encourage use of miralax daily for control Ok to use with or w/o stool softener Start with 1 cap full daily and titrate to one soft BM daily

## 2022-06-05 NOTE — Progress Notes (Signed)
I,Sulibeya S Dimas,acting as a Education administrator for Lavon Paganini, MD.,have documented all relevant documentation on the behalf of Lavon Paganini, MD,as directed by  Lavon Paganini, MD while in the presence of Lavon Paganini, MD.     Established patient visit   Patient: Daniel [name]   DOB: [birth year]   72 y.o. Male  MRN: [id number] Visit Date: 06/05/2022  Today's healthcare provider: Lavon Paganini, MD   Chief Complaint  Patient presents with   Follow-up   Subjective    HPI  Patient reports he was having pain on lower side of abdomen with bowel movement. He reports symptoms were present about 2-3 weeks ago. Reports symptoms have completely resolved. Patient was concerned about diverticulitis.   Had a similar episode in 1999 that he ended up in the hospital for diverticulitis.  No fever. No residual abd pain. Chronic constipation. Trying stool softener. Still having hard BMs  Medications: Outpatient Medications Prior to Visit  Medication Sig   albuterol (VENTOLIN HFA) 108 (90 Base) MCG/ACT inhaler Inhale 2 puffs into the lungs every 6 (six) hours as needed for wheezing or shortness of breath.   ELIQUIS 5 MG TABS tablet Take 5 mg by mouth 2 (two) times daily.   gabapentin (NEURONTIN) 100 MG capsule Take 300 mg by mouth at bedtime.   levothyroxine (SYNTHROID) 100 MCG tablet TAKE 1 TABLET BY MOUTH EVERY DAY BEFORE BREAKFAST   metoprolol succinate (TOPROL-XL) 25 MG 24 hr tablet Take 25 mg by mouth daily.   pantoprazole (PROTONIX) 40 MG tablet TAKE 1 TABLET(40 MG) BY MOUTH TWICE DAILY   traMADol (ULTRAM) 50 MG tablet Take 50 mg by mouth every 6 (six) hours as needed.   predniSONE (DELTASONE) 20 MG tablet Take 1 tablet (20 mg total) by mouth daily with breakfast. (Patient not taking: Reported on 06/05/2022)   No facility-administered medications prior to visit.    Review of Systems  Constitutional:  Negative for appetite change, chills and fever.  Respiratory:  Negative for  chest tightness and shortness of breath.   Cardiovascular:  Negative for chest pain.  Gastrointestinal:  Positive for constipation. Negative for abdominal distention, abdominal pain, blood in stool, diarrhea, nausea and vomiting.  Genitourinary:  Negative for hematuria.       Objective    BP 127/79 (BP Location: Left Arm, Patient Position: Sitting, Cuff Size: Large)   Pulse (!) 56   Temp 97.7 F (36.5 C) (Oral)   Resp 16   Wt 212 lb (96.2 kg)   SpO2 100%   BMI 27.22 kg/m  BP Readings from Last 3 Encounters:  06/05/22 127/79  05/15/22 135/88  03/28/22 127/81   Wt Readings from Last 3 Encounters:  06/05/22 212 lb (96.2 kg)  03/28/22 205 lb 14.4 oz (93.4 kg)  12/13/21 207 lb (93.9 kg)      Physical Exam Vitals reviewed.  Constitutional:      General: He is not in acute distress.    Appearance: Normal appearance. He is not diaphoretic.  HENT:     Head: Normocephalic and atraumatic.  Eyes:     General: No scleral icterus.    Conjunctiva/sclera: Conjunctivae normal.  Cardiovascular:     Rate and Rhythm: Normal rate and regular rhythm.     Pulses: Normal pulses.     Heart sounds: Normal heart sounds. No murmur heard. Pulmonary:     Effort: Pulmonary effort is normal. No respiratory distress.     Breath sounds: Normal breath sounds. No wheezing or rhonchi.  Abdominal:     General: There is no distension.     Palpations: Abdomen is soft.     Tenderness: There is no abdominal tenderness. There is no guarding or rebound.  Musculoskeletal:     Cervical back: Neck supple.     Right lower leg: No edema.     Left lower leg: No edema.  Lymphadenopathy:     Cervical: No cervical adenopathy.  Skin:    General: Skin is warm and dry.  Neurological:     Mental Status: He is alert and oriented to person, place, and time. Mental status is at baseline.  Psychiatric:        Mood and Affect: Mood normal.        Behavior: Behavior normal.       No results found for any visits  on 06/05/22.  Assessment & Plan     Problem List Items Addressed This Visit       Digestive   Chronic idiopathic constipation    Longstanding Worse in the setting of tramadol use Encourage use of miralax daily for control Ok to use with or w/o stool softener Start with 1 cap full daily and titrate to one soft BM daily        Other   Left lower quadrant abdominal pain - Primary    Resolved May have been 2/2 constipation (see plan above) Reassured patient that it was not diverticulitis as it has resolved without any abx or intervention        No follow-ups on file.      I, Lavon Paganini, MD, have reviewed all documentation for this visit. The documentation on 06/05/22 for the exam, diagnosis, procedures, and orders are all accurate and complete.   Maxen Rowland, Dionne Bucy, MD, MPH [facility]

## 2022-06-07 ENCOUNTER — Ambulatory Visit: Payer: Medicare Other

## 2022-06-07 DIAGNOSIS — M546 Pain in thoracic spine: Secondary | ICD-10-CM | POA: Diagnosis not present

## 2022-06-07 DIAGNOSIS — M5459 Other low back pain: Secondary | ICD-10-CM | POA: Diagnosis not present

## 2022-06-07 DIAGNOSIS — M545 Low back pain, unspecified: Secondary | ICD-10-CM

## 2022-06-07 DIAGNOSIS — R293 Abnormal posture: Secondary | ICD-10-CM | POA: Diagnosis not present

## 2022-06-07 DIAGNOSIS — R262 Difficulty in walking, not elsewhere classified: Secondary | ICD-10-CM

## 2022-06-07 DIAGNOSIS — G8929 Other chronic pain: Secondary | ICD-10-CM | POA: Diagnosis not present

## 2022-06-07 NOTE — Therapy (Signed)
OUTPATIENT PHYSICAL THERAPY TREATMENT NOTE   Patient Name: Daniel [name] MRN: [id number] DOB:03/17/1951, 72 y.o., male Today's Date: 06/07/2022  PCP: Jerrol Banana., MD REFERRING PROVIDER: Allene Dillon, NP  END OF SESSION:   PT End of Session - 06/07/22 1517     Visit Number 28    Number of Visits 41    Date for PT Re-Evaluation 06/07/22    Authorization Type [employer] MEDICARE reporting period from 03/29/2022    Progress Note Due on Visit 30    PT Start Time 1515    PT Stop Time 1533    PT Time Calculation (min) 18 min    Equipment Utilized During Treatment Gait belt    Activity Tolerance Patient tolerated treatment well;No increased pain    Behavior During Therapy [facility] for tasks assessed/performed                  Past Medical History:  Diagnosis Date   Actinic keratosis    Asthma    Eczema    Hx of dysplastic nevus 06/06/2006   Mid lower back, slight atypia   Hypothyroidism    Seasonal allergies    Thyroid disease    Past Surgical History:  Procedure Laterality Date   CATARACT EXTRACTION     COLON SURGERY     COLONOSCOPY WITH PROPOFOL N/A 11/18/2014   Procedure: COLONOSCOPY WITH PROPOFOL;  Surgeon: [name], MD;  Location: [facility];  Service: Endoscopy;  Laterality: N/A;   COLONOSCOPY WITH PROPOFOL N/A 12/13/2021   Procedure: COLONOSCOPY WITH PROPOFOL;  Surgeon: [name], MD;  Location: [facility];  Service: Endoscopy;  Laterality: N/A;   EYE SURGERY     KNEE SURGERY     NASAL SEPTUM SURGERY     Patient Active Problem List   Diagnosis Date Noted   Chronic idiopathic constipation 06/05/2022   Left lower quadrant abdominal pain 06/05/2022   Paroxysmal atrial fibrillation ([place]) 03/28/2022   Idiopathic hypotension 03/28/2022   Spondylolysis, lumbar region 03/28/2022   Avitaminosis D 03/28/2022   Myalgia due to statin 03/28/2022   Acquired thrombophilia ([place]) 03/28/2022   Hyperlipidemia 03/28/2022   Abnormal gait  03/28/2022   Mild intermittent asthma without complication 68/34/1962   Zenker's (hypopharyngeal) diverticulum 03/22/2018   Gastroesophageal reflux disease 03/22/2018   Fatty liver 10/09/2017   BPH (benign prostatic hyperplasia) 11/21/2014   Hypothyroidism 11/21/2014   Inguinal hernia 11/21/2014   OA (osteoarthritis) 11/21/2014   H/O adenomatous polyp of colon 10/14/2014    REFERRING DIAG: lumbar DDD (degenerative disc disease), lumbar stenosis with neurogenic claudication, lumbar radiculitis  THERAPY DIAG:  Other low back pain  Pain in thoracic spine  Abnormal posture  Difficulty in walking, not elsewhere classified  Chronic bilateral low back pain, unspecified whether sciatica present  Rationale for Evaluation and Treatment: Rehabilitation  PERTINENT HISTORY: Patient is a 72 y.o. male who presents to outpatient physical therapy with a referral for medical diagnosis lumbar DDD (degenerative disc disease, lumbar stenosis with neurogenic claudication, lumbar radiculitis. This patient's chief complaints consist of chronic pain in the low back and upper glutes that moves up towards his B scapulae with prolonged standing/walking and with intermittent shooting down his R left to the knee, leading to the following functional deficits: difficulty with anything that requires a lot of standing/walking, cooking for church/wine club, cleaning the house, waiting in line, standing longer than 5-20 min, having conversations with others while standing, yard work, Orthoptist, cleaning, sit <> stand, bending  and lifting together, staining deck, shopping, walking more than 2 blocks, social participation, changing positions in the bed, golfing.    Relevant past medical history and comorbidities include asthma, Zenker's (hypopharyngeal) diverticulum, afib, GERD, hx of fatty liver, hypothyroidism, BPH, OA, hx adeomatous polyp of colon, inguinal hernia, L knee pain, hx of dysplastic nevus, colon  surgery (7 inches removed of colon), cataract extraction, L knee pain.  Patient denies hx of cancer, stroke, seizures, lung problem, diabetes, unexplained weight loss, unexplained changes in bowel or bladder problems, unexplained stumbling or dropping things, spinal surgery. He has been exercising daily.     PRECAUTIONS: none  SUBJECTIVE: Pt reports he saw neurology on Monday, on new medication for parkinsons. This has made him quite nausead last day.   PAIN:  Are you having pain? NPRS 8/10 in low back  OBJECTIVE     TODAY'S TREATMENT   -Hooklying on plinth, legs elevated, moist heat to Rt low back area x 5 minutes, excellent reduction of pain from 8/10 to 2-3/10; -sustained release to Rt lower lumbar spinalis, moderate release after 60sec.  - standing against wall shoulder AAROM holding gait belt with both hands moving shoulders into flexion towards hyperflexion and back with hands spread as wide as needed to get belt overhead, 1x10 *able to completed 3 reps with sharp return of high pain levels, pt elects to end session and  return to home to focus on pain relief.   Pt required multimodal cuing for proper technique and to facilitate improved neuromuscular control, strength, range of motion, and functional ability resulting in improved performance and form.   PATIENT EDUCATION:  Education details: Exercise purpose/form. Self management techniques.  Reviewed cancelation/no-show policy with patient and confirmed patient has correct phone number for clinic; patient verbalized understanding (01/18/22). Person educated: Patient Education method: Explanation, demonstration, VC, TC Education comprehension: verbalized understanding, demonstrated understanding, and needs further education     HOME EXERCISE PROGRAM: Access Code: [email or web address] URL: [email or web address] Date: 02/08/2022 Prepared by: Rosita Kea  Exercises - Sidelying Thoracic Rotation with Open Book  - 1-2 x daily  - 3 sets - 10 reps - 5 seconds hold - Bridge  - 1-2 x daily - 3 sets - 10 reps - 5 seconds hold - Supine Lower Trunk Rotation  - 1 x daily - 3 sets - 15 reps - Prone Press Up  - 1-2 x daily - 3 sets - 10 reps - Bilateral Bent Leg Lift  - 1-2 x daily - 3 sets - 10 reps - Seated Thoracic Lumbar Extension  - Seated Flexion Stretch with Swiss Ball  - 1 x daily - 1 sets - 20 reps - 5 seconds hold - Baby Cobra Hands Hovering   - 1 x daily - 3 sets - 10 reps - 5 seconds hold   ASSESSMENT:   CLINICAL IMPRESSION: Session limited by significant increase in Rt low back pain near renal/infracostal area. Started session with attempts to bring down to a level that would allow improved participation. Continued with activity as tolerated which ends up being very little. Pt who reports nearly calling to cancel his visit, ultimately decides to cut his session short and go home to focus on pain control. Also discussed new medication side effects. Patient would benefit from continued management of limiting condition by skilled physical therapist to address remaining impairments and functional limitations to work towards stated goals and return to PLOF or maximal functional independence.         OBJECTIVE IMPAIRMENTS  Abnormal gait, decreased activity tolerance, decreased endurance, decreased mobility, difficulty walking, decreased ROM, decreased strength, hypomobility, impaired perceived functional ability, increased muscle spasms, impaired flexibility, improper body mechanics, postural dysfunction, and pain.    ACTIVITY LIMITATIONS carrying, lifting, bending, standing, bed mobility, dressing, and locomotion level   PARTICIPATION LIMITATIONS: meal prep, cleaning, laundry, interpersonal relationship, shopping, community activity, yard work, church, and   asthma, Zenker's (hypopharyngeal) diverticulum, afib, GERD, hx of fatty liver, hypothyroidism, BPH, OA, hx adeomatous polyp of colon, inguinal hernia, L knee pain,  hx of dysplastic nevus, colon surgery (7 inches removed of colon), cataract extraction, L knee pain   PERSONAL FACTORS Age, Past/current experiences, Time since onset of injury/illness/exacerbation, and 3+ comorbidities:   asthma, Zenker's (hypopharyngeal) diverticulum, afib, GERD, hx of fatty liver, hypothyroidism, BPH, OA, hx adeomatous polyp of colon, inguinal hernia, L knee pain, hx of dysplastic nevus, colon surgery (7 inches removed of colon), cataract extraction, L knee pain are also affecting patient's functional outcome.    REHAB POTENTIAL: Good   CLINICAL DECISION MAKING: Stable/uncomplicated   EVALUATION COMPLEXITY: Low     GOALS: Goals reviewed with patient? yes   SHORT TERM GOALS: Target date: 01/03/2022   Patient will be independent with initial home exercise program for self-management of symptoms. Baseline: Initial HEP to be provided at visit 2 as appropriate (12/20/21); initial HEP provided visit 2 (12/26/2021);  Goal status: met    LONG TERM GOALS: Target date: 03/14/2022. UPDATED to 06/07/2022 for all unmet goals on 03/15/2022.    Patient will be independent with a long-term home exercise program for self-management of symptoms.  Baseline: Initial HEP to be provided at visit 2 as appropriate (12/20/21); initial HEP provided visit 2 (12/26/2021); participating in HEP (01/23/2022); currently participating (03/15/2022);   Goal status: In-progress   2.  Patient will demonstrate improved FOTO to equal or greater than 67 by visit #11 to demonstrate improvement in overall condition and self-reported functional ability.  Baseline: 53 (12/20/21); 62 at visit # 10 (01/23/2022); 65 at visit #18 (03/15/2022); Goal status: In-progress   3.  Patient will ambulate equal or greater than 1200 feet on 6 Minute Walk Test with no increase in symptoms to demonstrate improved community ambulation.  Baseline: to be tested visit 2 as appropriate (12/20/21); 1,225' up to 2/10 NPS (12/26/2021); 1263  feet with no AD, no complaint of pain while walking. Pain at waist when going to sit after walking. R knee pain (01/23/2022); 1347 feet with no AD, stiffening at about  3 min. Pain at right scapula up to 4-5/10. R knee pain (03/15/2022);  Goal status: Met 01/23/2022   4.  Patient will demonstrate the ability to lift 25# box floor to waist 1x10 times in a row with no increase in symptoms to improve his ability to bend and lift things around his home.  Baseline: reports difficulty with bending and lifting items at home  (12/20/21); 3 reps at 25#. Reported pain with each lift at low back increasing to 4/10 at 25# with request to stop (01/23/2022); 1x10 reps at 25#. Reported pain with each lift at low back increasing to 5/10 at 25#. Incomplete stand and occasionally missing touching floor (03/15/2022);  Goal status: In-progress   5.  Patient will report the ability to stand/walk equal or greater than 60 min before needing to sit to improve his ability to stand in line, socialize, and cook.  Baseline: 5-20 min (12/20/21); on his feet 60 min with salon pass on his back making hashbrown  casserole this morning before he started to get pain, he feels he would not be able to do this without the salon pass (01/23/2022): Has not tested it recently, there is a party coming up where he will be cooking in November (03/15/2022);  Goal status: Nearly met.      PLAN: PT FREQUENCY: 1-2x/week   PT DURATION: 12 weeks   PLANNED INTERVENTIONS: Therapeutic exercises, Therapeutic activity, Neuromuscular re-education, Patient/Family education, Joint mobilization, DME instructions, Dry Needling, Electrical stimulation, Spinal mobilization, Cryotherapy, Moist heat, Manual therapy, and Re-evaluation.   PLAN FOR NEXT SESSION: update HEP as appropriate, manual therapy and exercises for improved joint mobility in the spine.  3:38 PM, 06/07/22 Etta Grandchild, PT, DPT Physical Therapist - [place] [phone]  (Office)   06/07/22, 3:38 PM  [facility] [address] [place], [place] [address] P: [phone] I F: [phone]

## 2022-06-08 DIAGNOSIS — I480 Paroxysmal atrial fibrillation: Secondary | ICD-10-CM | POA: Diagnosis not present

## 2022-06-08 DIAGNOSIS — R001 Bradycardia, unspecified: Secondary | ICD-10-CM | POA: Diagnosis not present

## 2022-06-08 DIAGNOSIS — E782 Mixed hyperlipidemia: Secondary | ICD-10-CM | POA: Diagnosis not present

## 2022-06-12 ENCOUNTER — Encounter: Payer: Self-pay | Admitting: Physical Therapy

## 2022-06-12 ENCOUNTER — Ambulatory Visit: Payer: Medicare Other | Admitting: Physical Therapy

## 2022-06-12 DIAGNOSIS — R293 Abnormal posture: Secondary | ICD-10-CM | POA: Diagnosis not present

## 2022-06-12 DIAGNOSIS — M5459 Other low back pain: Secondary | ICD-10-CM | POA: Diagnosis not present

## 2022-06-12 DIAGNOSIS — R262 Difficulty in walking, not elsewhere classified: Secondary | ICD-10-CM

## 2022-06-12 DIAGNOSIS — G8929 Other chronic pain: Secondary | ICD-10-CM | POA: Diagnosis not present

## 2022-06-12 DIAGNOSIS — M545 Low back pain, unspecified: Secondary | ICD-10-CM | POA: Diagnosis not present

## 2022-06-12 DIAGNOSIS — M546 Pain in thoracic spine: Secondary | ICD-10-CM

## 2022-06-12 NOTE — Therapy (Signed)
OUTPATIENT PHYSICAL THERAPY TREATMENT NOTE / PROGRESS NOTE / RE-CERTIFICATION Dates of reporting from 03/15/2022 - 06/12/2022  Patient Name: Daniel [name] MRN: [id number] DOB:1950/07/04, 72 y.o., male Today's Date: 06/12/2022  PCP: Jerrol Banana., MD REFERRING PROVIDER: Allene Dillon, NP  END OF SESSION:   PT End of Session - 06/12/22 1525     Visit Number 29    Number of Visits 41    Date for PT Re-Evaluation 09/04/22    Authorization Type [employer] MEDICARE reporting period from 03/29/2022    Progress Note Due on Visit 30    PT Start Time 1525    PT Stop Time 1605    PT Time Calculation (min) 40 min    Equipment Utilized During Treatment Gait belt    Activity Tolerance Patient tolerated treatment well;No increased pain    Behavior During Therapy [facility] for tasks assessed/performed              Past Medical History:  Diagnosis Date   Actinic keratosis    Asthma    Eczema    Hx of dysplastic nevus 06/06/2006   Mid lower back, slight atypia   Hypothyroidism    Seasonal allergies    Thyroid disease    Past Surgical History:  Procedure Laterality Date   CATARACT EXTRACTION     COLON SURGERY     COLONOSCOPY WITH PROPOFOL N/A 11/18/2014   Procedure: COLONOSCOPY WITH PROPOFOL;  Surgeon: [name], MD;  Location: [place];  Service: Endoscopy;  Laterality: N/A;   COLONOSCOPY WITH PROPOFOL N/A 12/13/2021   Procedure: COLONOSCOPY WITH PROPOFOL;  Surgeon: [name], MD;  Location: [facility] ENDOSCOPY;  Service: Endoscopy;  Laterality: N/A;   EYE SURGERY     KNEE SURGERY     NASAL SEPTUM SURGERY     Patient Active Problem List   Diagnosis Date Noted   Chronic idiopathic constipation 06/05/2022   Left lower quadrant abdominal pain 06/05/2022   Paroxysmal atrial fibrillation ([place]) 03/28/2022   Idiopathic hypotension 03/28/2022   Spondylolysis, lumbar region 03/28/2022   Avitaminosis D 03/28/2022   Myalgia due to statin 03/28/2022   Acquired  thrombophilia ([place]) 03/28/2022   Hyperlipidemia 03/28/2022   Abnormal gait 03/28/2022   Mild intermittent asthma without complication 85/06/7739   Zenker's (hypopharyngeal) diverticulum 03/22/2018   Gastroesophageal reflux disease 03/22/2018   Fatty liver 10/09/2017   BPH (benign prostatic hyperplasia) 11/21/2014   Hypothyroidism 11/21/2014   Inguinal hernia 11/21/2014   OA (osteoarthritis) 11/21/2014   H/O adenomatous polyp of colon 10/14/2014    REFERRING DIAG: lumbar DDD (degenerative disc disease), lumbar stenosis with neurogenic claudication, lumbar radiculitis  THERAPY DIAG:  Other low back pain  Pain in thoracic spine  Abnormal posture  Difficulty in walking, not elsewhere classified  Rationale for Evaluation and Treatment: Rehabilitation  PERTINENT HISTORY: Patient is a 72 y.o. male who presents to outpatient physical therapy with a referral for medical diagnosis lumbar DDD (degenerative disc disease, lumbar stenosis with neurogenic claudication, lumbar radiculitis. This patient's chief complaints consist of chronic pain in the low back and upper glutes that moves up towards his B scapulae with prolonged standing/walking and with intermittent shooting down his R left to the knee, leading to the following functional deficits: difficulty with anything that requires a lot of standing/walking, cooking for church/wine club, cleaning the house, waiting in line, standing longer than 5-20 min, having conversations with others while standing, yard work, Orthoptist, cleaning, sit <> stand, bending and lifting together,  staining deck, shopping, walking more than 2 blocks, social participation, changing positions in the bed, golfing.    Relevant past medical history and comorbidities include asthma, Zenker's (hypopharyngeal) diverticulum, afib, GERD, hx of fatty liver, hypothyroidism, BPH, OA, hx adeomatous polyp of colon, inguinal hernia, L knee pain, hx of dysplastic nevus,  colon surgery (7 inches removed of colon), cataract extraction, L knee pain.  Patient denies hx of cancer, stroke, seizures, lung problem, diabetes, unexplained weight loss, unexplained changes in bowel or bladder problems, unexplained stumbling or dropping things, spinal surgery. He has been exercising daily.     PRECAUTIONS: none  SUBJECTIVE: Patient reports he is feeling better since last PT session after starting medication (Carbidopa-Levodopa 25-100) for Parkinson's. He feels a bit of nausea today but not as bad as before. He was also limited at last PT session because he was having a lot of right flank pain. He has had this periodically over the last couple of decades. His doctors have been unable to determine where it came from. He has pain there a little bit. He states his usual back pain is fine currently. He states there is no pain at all there. He thinks the Parkinson's medication is possibly having a positive effect on his pain. He is feeling more or less okay today. He has been doing his HEP and they help. He finds the half push ups (prone press up), the exercise where he pulls knees to chest, and prone super mans help the most. He also bought a heating pad. He states he has noticed an improvement in his functional activity tolerance but he still has to quit what he is doing in a standing position because he feels weakness and pain.    PAIN:  Are you having pain? NPRS 4/10 right flank.   OBJECTIVE  SELF-REPORTED FUNCTION FOTO score: 63/100 (lumbar spine questionnaire)  FUNCTIONAL/BALANCE TEST 6 Minute Walk Test: 1400 feet with no AD, R flank pain increasing near  3 min and traveling to right upper trap region. Increased familiar pain at low back by end of walk NPRS 5/10.    - floor to waist lift with box 10-25# with 5# increase each rep, then 1x10 reps at 25#. Reported pain with each lift at low back increasing to 5/10 at 25#. Incomplete stand at times.   TODAY'S TREATMENT   Therapeutic exercise: to centralize symptoms and improve ROM, strength, muscular endurance, and activity tolerance required for successful completion of functional activities.  - measurements to assess progress (see above).  -  hooklying lower trunk rotation with clinician assistance and overpressure, top knee flexed, 3x30 seconds each side.  - seated right trunk rotation with clinician overpressure, 1x10 (improved right low back pain).   Manual therapy: to reduce pain and tissue tension, improve range of motion, neuromodulation, in order to promote improved ability to complete functional activities. - seated manual thoracic extension with PT's B UE under pt forearms with hands on patient's upper back as patient holds both elbows with hands. 2x10 (2nd set PT on 8 inch stool to improve ROM).   Pt required multimodal cuing for proper technique and to facilitate improved neuromuscular control, strength, range of motion, and functional ability resulting in improved performance and form.   PATIENT EDUCATION:  Education details: Exercise purpose/form. Self management techniques.  Reviewed cancelation/no-show policy with patient and confirmed patient has correct phone number for clinic; patient verbalized understanding (01/18/22). Person educated: Patient Education method: Explanation, demonstration, VC, TC Education comprehension: verbalized understanding, demonstrated  understanding, and needs further education     HOME EXERCISE PROGRAM: Access Code: [email or web address] URL: [email or web address] Date: 02/08/2022 Prepared by: Rosita Kea  Exercises - Sidelying Thoracic Rotation with Open Book  - 1-2 x daily - 3 sets - 10 reps - 5 seconds hold - Bridge  - 1-2 x daily - 3 sets - 10 reps - 5 seconds hold - Supine Lower Trunk Rotation  - 1 x daily - 3 sets - 15 reps - Prone Press Up  - 1-2 x daily - 3 sets - 10 reps - Bilateral Bent Leg Lift  - 1-2 x daily - 3 sets - 10 reps - Seated  Thoracic Lumbar Extension  - Seated Flexion Stretch with Swiss Ball  - 1 x daily - 1 sets - 20 reps - 5 seconds hold - Baby Cobra Hands Hovering   - 1 x daily - 3 sets - 10 reps - 5 seconds hold   ASSESSMENT:   CLINICAL IMPRESSION: Patient has attended 29 physical therapy sessions since starting current episode of care on 12/20/2021. Patient has made significant progress towards goals since starting PT but has not showed much objective improvement towards goals since his last progress note. He does report improved improved standing tolerance up to over 60 min since last progress note when he estimated 15 min and he has reported improved back pain recently after starting Carbidopa-Levodopa about a week and a half ago for parkinsonism. Patient has had some difficulty with regular visits due to illness, weather, and scheduling issues related to his outside responsibilities. He recently was limited by side effects of the new medication that is getting better.  Patient reports his schedule is more clear over the next month, and he reports being motivated to attend 2x a week where he feels he gets the most benefits. He has been consistent with HEP and feels he gets improved pain relief and function when he does his HEP regularly and comes to PT twice a week. Patient continues to have debilitating low back pain as well as changes in posture and difficulty moving through full ROM. Patient has potential for further improvements in pain and posture with new medication prescribed to help him move better. Due to this change that has likelihood to improve his rehab potential, plan to continue with PT to continue working towards goals and improved quality of life. Patient would benefit from continued management of limiting condition by skilled physical therapist to address remaining impairments and functional limitations to work towards stated goals and return to PLOF or maximal functional independence.     OBJECTIVE  IMPAIRMENTS Abnormal gait, decreased activity tolerance, decreased endurance, decreased mobility, difficulty walking, decreased ROM, decreased strength, hypomobility, impaired perceived functional ability, increased muscle spasms, impaired flexibility, improper body mechanics, postural dysfunction, and pain.    ACTIVITY LIMITATIONS carrying, lifting, bending, standing, bed mobility, dressing, and locomotion level   PARTICIPATION LIMITATIONS: meal prep, cleaning, laundry, interpersonal relationship, shopping, community activity, yard work, church, and   asthma, Zenker's (hypopharyngeal) diverticulum, afib, GERD, hx of fatty liver, hypothyroidism, BPH, OA, hx adeomatous polyp of colon, inguinal hernia, L knee pain, hx of dysplastic nevus, colon surgery (7 inches removed of colon), cataract extraction, L knee pain   PERSONAL FACTORS Age, Past/current experiences, Time since onset of injury/illness/exacerbation, and 3+ comorbidities:   asthma, Zenker's (hypopharyngeal) diverticulum, afib, GERD, hx of fatty liver, hypothyroidism, BPH, OA, hx adeomatous polyp of colon, inguinal hernia, L knee pain, hx of dysplastic nevus, colon surgery (7 inches  removed of colon), cataract extraction, L knee pain are also affecting patient's functional outcome.    REHAB POTENTIAL: Good   CLINICAL DECISION MAKING: Stable/uncomplicated   EVALUATION COMPLEXITY: Low     GOALS: Goals reviewed with patient? yes   SHORT TERM GOALS: Target date: 01/03/2022   Patient will be independent with initial home exercise program for self-management of symptoms. Baseline: Initial HEP to be provided at visit 2 as appropriate (12/20/21); initial HEP provided visit 2 (12/26/2021);  Goal status: met    LONG TERM GOALS: Target date: 03/14/2022. UPDATED to 06/07/2022 for all unmet goals on 03/15/2022. UPDATED to 09/04/2022 for all unmet goals on 06/12/2022.    Patient will be independent with a long-term home exercise program for  self-management of symptoms.  Baseline: Initial HEP to be provided at visit 2 as appropriate (12/20/21); initial HEP provided visit 2 (12/26/2021); participating in HEP (01/23/2022); currently participating (03/15/2022; 06/12/2022);   Goal status: In-progress   2.  Patient will demonstrate improved FOTO to equal or greater than 67 by visit #11 to demonstrate improvement in overall condition and self-reported functional ability.  Baseline: 54 (12/20/21); 62 at visit # 10 (01/23/2022); 65 at visit #18 (03/15/2022); 63 at visit #29 (06/12/2022);  Goal status: In-progress   3.  Patient will ambulate equal or greater than 1200 feet on 6 Minute Walk Test with no increase in symptoms to demonstrate improved community ambulation.  Baseline: to be tested visit 2 as appropriate (12/20/21); 1,225' up to 2/10 NPS (12/26/2021); 1263 feet with no AD, no complaint of pain while walking. Pain at waist when going to sit after walking. R knee pain (01/23/2022); 1347 feet with no AD, stiffening at about  3 min. Pain at right scapula up to 4-5/10. R knee pain (03/15/2022); 1400 feet with no AD, R flank pain increasing near  3 min and traveling to right upper trap region. Increased familiar pain at low back by end of walk NPRS 5/10 (06/12/2022);  Goal status: Met 01/23/2022   4.  Patient will demonstrate the ability to lift 25# box floor to waist 1x10 times in a row with no increase in symptoms to improve his ability to bend and lift things around his home.  Baseline: reports difficulty with bending and lifting items at home  (12/20/21); 3 reps at 25#. Reported pain with each lift at low back increasing to 4/10 at 25# with request to stop (01/23/2022); 1x10 reps at 25#. Reported pain with each lift at low back increasing to 5/10 at 25#. Incomplete stand and occasionally missing touching floor (03/15/2022; 06/12/2022);  Goal status: In-progress   5.  Patient will report the ability to stand/walk equal or greater than 60 min before  needing to sit to improve his ability to stand in line, socialize, and cook.  Baseline: 5-20 min (12/20/21); on his feet 60 min with salon pass on his back making hashbrown casserole this morning before he started to get pain, he feels he would not be able to do this without the salon pass (01/23/2022): Has not tested it recently, there is a party coming up where he will be cooking in November (03/15/2022); at least 1 hour before he has to sit down, he can clean 1 bathroom at a time (06/12/2022);  Goal status: Nearly met.      PLAN: PT FREQUENCY: 1-2x/week   PT DURATION: 12 weeks   PLANNED INTERVENTIONS: Therapeutic exercises, Therapeutic activity, Neuromuscular re-education, Patient/Family education, Joint mobilization, DME instructions, Dry Needling, Electrical stimulation, Spinal  mobilization, Cryotherapy, Moist heat, Manual therapy, and Re-evaluation.   PLAN FOR NEXT SESSION: update HEP as appropriate, manual therapy and exercises for improved joint mobility in the spine.  Everlean Alstrom. Graylon Good, PT, DPT 06/12/22, 4:39 PM  [facility] [address] [place], [place] [address] P: [phone] I F: [phone]

## 2022-06-13 ENCOUNTER — Encounter: Payer: Medicare Other | Admitting: Dermatology

## 2022-06-14 ENCOUNTER — Ambulatory Visit: Payer: Medicare Other | Admitting: Physical Therapy

## 2022-06-15 LAB — BASIC METABOLIC PANEL
BUN: 16 (ref 4–21)
CO2: 29 — AB (ref 13–22)
Chloride: 105 (ref 99–108)
Creatinine: 1 (ref 0.6–1.3)
Glucose: 86
Potassium: 3.9 mEq/L (ref 3.5–5.1)
Sodium: 140 (ref 137–147)

## 2022-06-15 LAB — CBC AND DIFFERENTIAL
HCT: 47 (ref 41–53)
Hemoglobin: 16.1 (ref 13.5–17.5)
Platelets: 262 10*3/uL (ref 150–400)
WBC: 6.5

## 2022-06-15 LAB — TSH: TSH: 1.12 (ref 0.41–5.90)

## 2022-06-15 LAB — COMPREHENSIVE METABOLIC PANEL: eGFR: 79

## 2022-06-15 LAB — VITAMIN B12: Vitamin B-12: 289

## 2022-06-15 LAB — VITAMIN D 25 HYDROXY (VIT D DEFICIENCY, FRACTURES): Vit D, 25-Hydroxy: 29.7

## 2022-06-15 LAB — CBC: RBC: 65.34 — AB (ref 3.87–5.11)

## 2022-06-19 ENCOUNTER — Encounter: Payer: Self-pay | Admitting: Physical Therapy

## 2022-06-19 ENCOUNTER — Ambulatory Visit: Payer: Medicare Other | Admitting: Physical Therapy

## 2022-06-19 DIAGNOSIS — M546 Pain in thoracic spine: Secondary | ICD-10-CM

## 2022-06-19 DIAGNOSIS — R262 Difficulty in walking, not elsewhere classified: Secondary | ICD-10-CM

## 2022-06-19 DIAGNOSIS — R293 Abnormal posture: Secondary | ICD-10-CM | POA: Diagnosis not present

## 2022-06-19 DIAGNOSIS — M545 Low back pain, unspecified: Secondary | ICD-10-CM | POA: Diagnosis not present

## 2022-06-19 DIAGNOSIS — G8929 Other chronic pain: Secondary | ICD-10-CM | POA: Diagnosis not present

## 2022-06-19 DIAGNOSIS — M5459 Other low back pain: Secondary | ICD-10-CM

## 2022-06-19 NOTE — Therapy (Signed)
OUTPATIENT PHYSICAL THERAPY TREATMENT NOTE / PROGRESS NOTE  Dates of reporting from 03/15/2022 - 06/19/2022  Patient Name: Daniel [name] MRN: [id number] DOB:1951/01/21, 72 y.o., male Today's Date: 06/19/2022  PCP: Jerrol Banana., MD REFERRING PROVIDER: Allene Dillon, NP  END OF SESSION:   PT End of Session - 06/19/22 1543     Visit Number 30    Number of Visits 41    Date for PT Re-Evaluation 09/04/22    Authorization Type [employer] MEDICARE reporting period from 03/15/2022    Progress Note Due on Visit 30    PT Start Time 1545    PT Stop Time 1630    PT Time Calculation (min) 45 min    Equipment Utilized During Treatment Gait belt    Activity Tolerance Patient tolerated treatment well;No increased pain    Behavior During Therapy [facility] for tasks assessed/performed              Past Medical History:  Diagnosis Date   Actinic keratosis    Asthma    Eczema    Hx of dysplastic nevus 06/06/2006   Mid lower back, slight atypia   Hypothyroidism    Seasonal allergies    Thyroid disease    Past Surgical History:  Procedure Laterality Date   CATARACT EXTRACTION     COLON SURGERY     COLONOSCOPY WITH PROPOFOL N/A 11/18/2014   Procedure: COLONOSCOPY WITH PROPOFOL;  Surgeon: [name], MD;  Location: [place];  Service: Endoscopy;  Laterality: N/A;   COLONOSCOPY WITH PROPOFOL N/A 12/13/2021   Procedure: COLONOSCOPY WITH PROPOFOL;  Surgeon: [name], MD;  Location: [facility];  Service: Endoscopy;  Laterality: N/A;   EYE SURGERY     KNEE SURGERY     NASAL SEPTUM SURGERY     Patient Active Problem List   Diagnosis Date Noted   Chronic idiopathic constipation 06/05/2022   Left lower quadrant abdominal pain 06/05/2022   Paroxysmal atrial fibrillation ([place]) 03/28/2022   Idiopathic hypotension 03/28/2022   Spondylolysis, lumbar region 03/28/2022   Avitaminosis D 03/28/2022   Myalgia due to statin 03/28/2022   Acquired thrombophilia ([place])  03/28/2022   Hyperlipidemia 03/28/2022   Abnormal gait 03/28/2022   Mild intermittent asthma without complication 86/57/8469   Zenker's (hypopharyngeal) diverticulum 03/22/2018   Gastroesophageal reflux disease 03/22/2018   Fatty liver 10/09/2017   BPH (benign prostatic hyperplasia) 11/21/2014   Hypothyroidism 11/21/2014   Inguinal hernia 11/21/2014   OA (osteoarthritis) 11/21/2014   H/O adenomatous polyp of colon 10/14/2014    REFERRING DIAG: lumbar DDD (degenerative disc disease), lumbar stenosis with neurogenic claudication, lumbar radiculitis  THERAPY DIAG:  Other low back pain  Pain in thoracic spine  Abnormal posture  Difficulty in walking, not elsewhere classified  Rationale for Evaluation and Treatment: Rehabilitation  PERTINENT HISTORY: Patient is a 72 y.o. male who presents to outpatient physical therapy with a referral for medical diagnosis lumbar DDD (degenerative disc disease, lumbar stenosis with neurogenic claudication, lumbar radiculitis. This patient's chief complaints consist of chronic pain in the low back and upper glutes that moves up towards his B scapulae with prolonged standing/walking and with intermittent shooting down his R left to the knee, leading to the following functional deficits: difficulty with anything that requires a lot of standing/walking, cooking for church/wine club, cleaning the house, waiting in line, standing longer than 5-20 min, having conversations with others while standing, yard work, Orthoptist, cleaning, sit <> stand, bending and lifting together, staining  deck, shopping, walking more than 2 blocks, social participation, changing positions in the bed, golfing.    Relevant past medical history and comorbidities include asthma, Zenker's (hypopharyngeal) diverticulum, afib, GERD, hx of fatty liver, hypothyroidism, BPH, OA, hx adeomatous polyp of colon, inguinal hernia, L knee pain, hx of dysplastic nevus, colon surgery (7 inches  removed of colon), cataract extraction, L knee pain.  Patient denies hx of cancer, stroke, seizures, lung problem, diabetes, unexplained weight loss, unexplained changes in bowel or bladder problems, unexplained stumbling or dropping things, spinal surgery. He has been exercising daily.     PRECAUTIONS: none  SUBJECTIVE: Patient reports he felt better yesterday than today. He reports he is having some heartburn from something he ate.  He states his back has been doing pretty good but it still hurts in the morning and feels better after he gets moving and takes tramadol. He reports little pain upon arrival. He states he was sore after last PT session in his back.   PAIN:  Are you having pain? NPRS 1/10 right flank.   OBJECTIVE (last measured 06/12/2022 unless otherwise noted)  SELF-REPORTED FUNCTION FOTO score: 63/100 (lumbar spine questionnaire)  FUNCTIONAL/BALANCE TEST 6 Minute Walk Test: 1400 feet with no AD, R flank pain increasing near  3 min and traveling to right upper trap region. Increased familiar pain at low back by end of walk NPRS 5/10.    - floor to waist lift with box 10-25# with 5# increase each rep, then 1x10 reps at 25#. Reported pain with each lift at low back increasing to 5/10 at 25#. Incomplete stand at times.   TODAY'S TREATMENT  Therapeutic exercise: to centralize symptoms and improve ROM, strength, muscular endurance, and activity tolerance required for successful completion of functional activities.  - standing frontal plane leg swings at TM bar with B UE support, 1x30 seconds each foot. Cuing for increased ROM, back movement.  - hooklying wide leg low trunk rotation with clinician OP at end range 1x10 each side and without clinician OP 1x10.  - foam roller up the wall thoracic spine stretch 1x5 but discontinued due to lack of thoracic stretch (felt more in shoulders).  - hooklying bridge 2x10, cuing for improved ROM and decreased speed.   Superset:  - seated  thoracic extension over towel roll in chair with clinician OP, 3x10 with up to 5 second hold.  - sit <> stand with overhead press with 3#DB in each hand. From airex pad in 18 inch chair. Cuing for improved UE position. Able to complete 3 reps from 18 inch chair before adding airex.   - half hooklying curl with hands reaching towards feet, 5x15-20 seconds (chin tuck hardest).   Manual therapy: to reduce pain and tissue tension, improve range of motion, neuromodulation, in order to promote improved ability to complete functional activities. - hooklying rotational physiologic mobilization with Green Theraaball under legs, 2x30-60 seconds each side (left lumbar rotation harder due to left sided pain).  - seated manual thoracic extension with PT's B UE under pt forearms with hands on patient's upper back as patient holds both elbows with hands. 3x30-0 seconds (PT on 8 inch stool to improve ROM).   Pt required multimodal cuing for proper technique and to facilitate improved neuromuscular control, strength, range of motion, and functional ability resulting in improved performance and form.   PATIENT EDUCATION:  Education details: Exercise purpose/form. Self management techniques.  Reviewed cancelation/no-show policy with patient and confirmed patient has correct phone number for clinic;  patient verbalized understanding (01/18/22). Person educated: Patient Education method: Explanation, demonstration, VC, TC Education comprehension: verbalized understanding, demonstrated understanding, and needs further education     HOME EXERCISE PROGRAM: Access Code: [email or web address] URL: [email or web address] Date: 02/08/2022 Prepared by: Rosita Kea  Exercises - Sidelying Thoracic Rotation with Open Book  - 1-2 x daily - 3 sets - 10 reps - 5 seconds hold - Bridge  - 1-2 x daily - 3 sets - 10 reps - 5 seconds hold - Supine Lower Trunk Rotation  - 1 x daily - 3 sets - 15 reps - Prone Press Up  - 1-2 x  daily - 3 sets - 10 reps - Bilateral Bent Leg Lift  - 1-2 x daily - 3 sets - 10 reps - Seated Thoracic Lumbar Extension  - Seated Flexion Stretch with Swiss Ball  - 1 x daily - 1 sets - 20 reps - 5 seconds hold - Baby Cobra Hands Hovering   - 1 x daily - 3 sets - 10 reps - 5 seconds hold   ASSESSMENT:   CLINICAL IMPRESSION: Patient arrives reporting improved pain through the day but not in the mornings. He continues to have difficulty with stooped posture and energy level. Today's session focused on AROM and PT assisted exercises for spine mobility and postural/functional strength. Patient fatigued quickly and was unable to rise from 18 inch chair more than 3 times without UE support. Patient was able to get much better stretch with PT facilitation and OP for spine rotation and extension. Patient fatigued quickly with chin tuck and lift during curl and was most limited by poor neck strength/endurance. Plan to continue with ROM and postural/functional strengthening next session as appropriate.   Patient has attended 30 physical therapy sessions since starting current episode of care on 12/20/2021. Patient has made significant progress towards goals since starting PT but has not showed much objective improvement towards goals since his last progress note. He does report improved improved standing tolerance up to over 60 min since last progress note when he estimated 15 min and he has reported improved back pain recently after starting Carbidopa-Levodopa about two weeks ago ago for parkinsonism. Patient has had some difficulty with regular visits due to illness, weather, and scheduling issues related to his outside responsibilities. He recently was limited by side effects of the new medication that is getting better.  Patient reports his schedule is more clear over the next month, and he reports being motivated to attend 2x a week where he feels he gets the most benefits. He has been consistent with HEP and  feels he gets improved pain relief and function when he does his HEP regularly and comes to PT twice a week. Patient continues to have debilitating low back pain as well as changes in posture and difficulty moving through full ROM. Patient has potential for further improvements in pain and posture with new medication prescribed to help him move better. Due to this change that has likelihood to improve his rehab potential, plan to continue with PT to continue working towards goals and improved quality of life. Patient would benefit from continued management of limiting condition by skilled physical therapist to address remaining impairments and functional limitations to work towards stated goals and return to PLOF or maximal functional independence.     OBJECTIVE IMPAIRMENTS Abnormal gait, decreased activity tolerance, decreased endurance, decreased mobility, difficulty walking, decreased ROM, decreased strength, hypomobility, impaired perceived functional ability, increased muscle spasms, impaired flexibility, improper body mechanics, postural  dysfunction, and pain.    ACTIVITY LIMITATIONS carrying, lifting, bending, standing, bed mobility, dressing, and locomotion level   PARTICIPATION LIMITATIONS: meal prep, cleaning, laundry, interpersonal relationship, shopping, community activity, yard work, church, and   asthma, Zenker's (hypopharyngeal) diverticulum, afib, GERD, hx of fatty liver, hypothyroidism, BPH, OA, hx adeomatous polyp of colon, inguinal hernia, L knee pain, hx of dysplastic nevus, colon surgery (7 inches removed of colon), cataract extraction, L knee pain   PERSONAL FACTORS Age, Past/current experiences, Time since onset of injury/illness/exacerbation, and 3+ comorbidities:   asthma, Zenker's (hypopharyngeal) diverticulum, afib, GERD, hx of fatty liver, hypothyroidism, BPH, OA, hx adeomatous polyp of colon, inguinal hernia, L knee pain, hx of dysplastic nevus, colon surgery (7 inches removed  of colon), cataract extraction, L knee pain are also affecting patient's functional outcome.    REHAB POTENTIAL: Good   CLINICAL DECISION MAKING: Stable/uncomplicated   EVALUATION COMPLEXITY: Low     GOALS: Goals reviewed with patient? yes   SHORT TERM GOALS: Target date: 01/03/2022   Patient will be independent with initial home exercise program for self-management of symptoms. Baseline: Initial HEP to be provided at visit 2 as appropriate (12/20/21); initial HEP provided visit 2 (12/26/2021);  Goal status: met    LONG TERM GOALS: Target date: 03/14/2022. UPDATED to 06/07/2022 for all unmet goals on 03/15/2022. UPDATED to 09/04/2022 for all unmet goals on 06/12/2022.    Patient will be independent with a long-term home exercise program for self-management of symptoms.  Baseline: Initial HEP to be provided at visit 2 as appropriate (12/20/21); initial HEP provided visit 2 (12/26/2021); participating in HEP (01/23/2022); currently participating (03/15/2022; 06/12/2022);   Goal status: In-progress   2.  Patient will demonstrate improved FOTO to equal or greater than 67 by visit #11 to demonstrate improvement in overall condition and self-reported functional ability.  Baseline: 54 (12/20/21); 62 at visit # 10 (01/23/2022); 65 at visit #18 (03/15/2022); 63 at visit #29 (06/12/2022);  Goal status: In-progress   3.  Patient will ambulate equal or greater than 1200 feet on 6 Minute Walk Test with no increase in symptoms to demonstrate improved community ambulation.  Baseline: to be tested visit 2 as appropriate (12/20/21); 1,225' up to 2/10 NPS (12/26/2021); 1263 feet with no AD, no complaint of pain while walking. Pain at waist when going to sit after walking. R knee pain (01/23/2022); 1347 feet with no AD, stiffening at about  3 min. Pain at right scapula up to 4-5/10. R knee pain (03/15/2022); 1400 feet with no AD, R flank pain increasing near  3 min and traveling to right upper trap region. Increased  familiar pain at low back by end of walk NPRS 5/10 (06/12/2022);  Goal status: Met 01/23/2022   4.  Patient will demonstrate the ability to lift 25# box floor to waist 1x10 times in a row with no increase in symptoms to improve his ability to bend and lift things around his home.  Baseline: reports difficulty with bending and lifting items at home  (12/20/21); 3 reps at 25#. Reported pain with each lift at low back increasing to 4/10 at 25# with request to stop (01/23/2022); 1x10 reps at 25#. Reported pain with each lift at low back increasing to 5/10 at 25#. Incomplete stand and occasionally missing touching floor (03/15/2022; 06/12/2022);  Goal status: In-progress   5.  Patient will report the ability to stand/walk equal or greater than 60 min before needing to sit to improve his ability to stand in line,  socialize, and cook.  Baseline: 5-20 min (12/20/21); on his feet 60 min with salon pass on his back making hashbrown casserole this morning before he started to get pain, he feels he would not be able to do this without the salon pass (01/23/2022): Has not tested it recently, there is a party coming up where he will be cooking in November (03/15/2022); at least 1 hour before he has to sit down, he can clean 1 bathroom at a time (06/12/2022);  Goal status: Nearly met.      PLAN: PT FREQUENCY: 1-2x/week   PT DURATION: 12 weeks   PLANNED INTERVENTIONS: Therapeutic exercises, Therapeutic activity, Neuromuscular re-education, Patient/Family education, Joint mobilization, DME instructions, Dry Needling, Electrical stimulation, Spinal mobilization, Cryotherapy, Moist heat, Manual therapy, and Re-evaluation.   PLAN FOR NEXT SESSION: update HEP as appropriate, manual therapy and exercises for improved joint mobility in the spine.  Everlean Alstrom. Graylon Good, PT, DPT 06/19/22, 4:35 PM  [facility] [address] [place], [place] [address] P: [phone] I F: [phone]

## 2022-06-20 ENCOUNTER — Ambulatory Visit (INDEPENDENT_AMBULATORY_CARE_PROVIDER_SITE_OTHER): Payer: Medicare Other | Admitting: Dermatology

## 2022-06-20 DIAGNOSIS — D492 Neoplasm of unspecified behavior of bone, soft tissue, and skin: Secondary | ICD-10-CM

## 2022-06-20 DIAGNOSIS — L720 Epidermal cyst: Secondary | ICD-10-CM | POA: Diagnosis not present

## 2022-06-20 MED ORDER — MUPIROCIN 2 % EX OINT: 1.0000 | TOPICAL_OINTMENT | Freq: Every day | CUTANEOUS | 1 refills | Status: DC

## 2022-06-20 NOTE — Patient Instructions (Addendum)

## 2022-06-20 NOTE — Progress Notes (Signed)
   Follow-Up Visit   Subjective  Daniel [name] is a 72 y.o. male who presents for the following: cyst vs other (L cheek, pt presents for excision).  The following portions of the chart were reviewed this encounter and updated as appropriate:   Tobacco  Allergies  Meds  Problems  Med Hx  Surg Hx  Fam Hx     Review of Systems:  No other skin or systemic complaints except as noted in HPI or Assessment and Plan.  Objective  Well appearing patient in no apparent distress; mood and affect are within normal limits.  A focused examination was performed including face. Relevant physical exam findings are noted in the Assessment and Plan.  L cheek Cystic pap 1.5 x 1.0cm   Assessment & Plan  Neoplasm of skin L cheek  Skin excision  Lesion length (cm):  1.5 Lesion width (cm):  1 Margin per side (cm):  0 Total excision diameter (cm):  1.5 Informed consent: discussed and consent obtained   Timeout: patient name, date of birth, surgical site, and procedure verified   Procedure prep:  Patient was prepped and draped in usual sterile fashion Prep type:  Isopropyl alcohol and povidone-iodine Anesthesia: the lesion was anesthetized in a standard fashion   Anesthetic:  1% lidocaine w/ epinephrine 1-100,000 buffered w/ 8.4% NaHCO3 (6cc lido w/ epi, 3cc bupivicaine, Total of 9cc) Instrument used comment:  #15c blade Hemostasis achieved with: pressure   Hemostasis achieved with comment:  Electrocautery Outcome: patient tolerated procedure well with no complications   Post-procedure details: sterile dressing applied and wound care instructions given   Dressing type: bandage, pressure dressing and bacitracin (Mupirocin)    Skin repair Complexity:  Complex Final length (cm):  2.1 Reason for type of repair: reduce tension to allow closure, reduce the risk of dehiscence, infection, and necrosis, reduce subcutaneous dead space and avoid a hematoma, allow closure of the large defect, preserve  normal anatomy, preserve normal anatomical and functional relationships and enhance both functionality and cosmetic results   Undermining: area extensively undermined   Undermining comment:  Undermining Defect 1.5 cm Subcutaneous layers (deep stitches):  Suture size:  5-0 Suture type: Vicryl (polyglactin 910)   Subcutaneous suture technique: Inverted Dermal. Fine/surface layer approximation (top stitches):  Suture size:  5-0 Stitches: simple interrupted   Suture removal (days):  7 Hemostasis achieved with: pressure Outcome: patient tolerated procedure well with no complications   Post-procedure details: sterile dressing applied and wound care instructions given   Dressing type: bandage, pressure dressing and bacitracin (Mupirocin)    mupirocin ointment (BACTROBAN) 2 % Apply 1 Application topically daily. Qd to excision site  Specimen 1 - Surgical pathology Differential Diagnosis: D48.5 Cyst vs other Check Margins: No Cystic pap 1.5 x 1.0cm  Cyst vs other excised today Start Mupirocin oint qd to excision site   Return in about 1 week (around 06/27/2022) for suture removal.  I, Othelia Pulling, RMA, am acting as scribe for Sarina Ser, MD . Documentation: I have reviewed the above documentation for accuracy and completeness, and I agree with the above.  Sarina Ser, MD

## 2022-06-21 ENCOUNTER — Telehealth: Payer: Self-pay

## 2022-06-21 ENCOUNTER — Ambulatory Visit: Payer: Medicare Other | Admitting: Physical Therapy

## 2022-06-21 NOTE — Telephone Encounter (Signed)
Patient doing fine after yesterdays surgery./sh 

## 2022-06-23 DIAGNOSIS — M1711 Unilateral primary osteoarthritis, right knee: Secondary | ICD-10-CM | POA: Diagnosis not present

## 2022-06-26 ENCOUNTER — Encounter: Payer: Self-pay | Admitting: Physical Therapy

## 2022-06-26 ENCOUNTER — Ambulatory Visit: Payer: Medicare Other | Admitting: Physical Therapy

## 2022-06-26 DIAGNOSIS — M5459 Other low back pain: Secondary | ICD-10-CM | POA: Diagnosis not present

## 2022-06-26 DIAGNOSIS — G8929 Other chronic pain: Secondary | ICD-10-CM | POA: Diagnosis not present

## 2022-06-26 DIAGNOSIS — M546 Pain in thoracic spine: Secondary | ICD-10-CM | POA: Diagnosis not present

## 2022-06-26 DIAGNOSIS — R262 Difficulty in walking, not elsewhere classified: Secondary | ICD-10-CM

## 2022-06-26 DIAGNOSIS — M545 Low back pain, unspecified: Secondary | ICD-10-CM | POA: Diagnosis not present

## 2022-06-26 DIAGNOSIS — R293 Abnormal posture: Secondary | ICD-10-CM

## 2022-06-26 NOTE — Therapy (Signed)
OUTPATIENT PHYSICAL THERAPY TREATMENT NOTE   Patient Name: Daniel [name] MRN: [id number] DOB:Aug 30, 1950, 72 y.o., male Today's Date: 06/26/2022  PCP: Jerrol Banana., MD REFERRING PROVIDER: Allene Dillon, NP  END OF SESSION:   PT End of Session - 06/26/22 1656     Visit Number 31    Number of Visits 41    Date for PT Re-Evaluation 09/04/22    Authorization Type [employer] MEDICARE reporting period from 06/19/2022    Progress Note Due on Visit 30    PT Start Time 1605    PT Stop Time 1645    PT Time Calculation (min) 40 min    Equipment Utilized During Treatment Gait belt    Activity Tolerance Patient tolerated treatment well    Behavior During Therapy [facility] for tasks assessed/performed               Past Medical History:  Diagnosis Date   Actinic keratosis    Asthma    Eczema    Hx of dysplastic nevus 06/06/2006   Mid lower back, slight atypia   Hypothyroidism    Seasonal allergies    Thyroid disease    Past Surgical History:  Procedure Laterality Date   CATARACT EXTRACTION     COLON SURGERY     COLONOSCOPY WITH PROPOFOL N/A 11/18/2014   Procedure: COLONOSCOPY WITH PROPOFOL;  Surgeon: [name], MD;  Location: [facility];  Service: Endoscopy;  Laterality: N/A;   COLONOSCOPY WITH PROPOFOL N/A 12/13/2021   Procedure: COLONOSCOPY WITH PROPOFOL;  Surgeon: [name], MD;  Location: [facility] ENDOSCOPY;  Service: Endoscopy;  Laterality: N/A;   EYE SURGERY     KNEE SURGERY     NASAL SEPTUM SURGERY     Patient Active Problem List   Diagnosis Date Noted   Chronic idiopathic constipation 06/05/2022   Left lower quadrant abdominal pain 06/05/2022   Paroxysmal atrial fibrillation ([place]) 03/28/2022   Idiopathic hypotension 03/28/2022   Spondylolysis, lumbar region 03/28/2022   Avitaminosis D 03/28/2022   Myalgia due to statin 03/28/2022   Acquired thrombophilia ([place]) 03/28/2022   Hyperlipidemia 03/28/2022   Abnormal gait 03/28/2022   Mild  intermittent asthma without complication 36/62/9476   Zenker's (hypopharyngeal) diverticulum 03/22/2018   Gastroesophageal reflux disease 03/22/2018   Fatty liver 10/09/2017   BPH (benign prostatic hyperplasia) 11/21/2014   Hypothyroidism 11/21/2014   Inguinal hernia 11/21/2014   OA (osteoarthritis) 11/21/2014   H/O adenomatous polyp of colon 10/14/2014    REFERRING DIAG: lumbar DDD (degenerative disc disease), lumbar stenosis with neurogenic claudication, lumbar radiculitis  THERAPY DIAG:  Other low back pain  Pain in thoracic spine  Abnormal posture  Difficulty in walking, not elsewhere classified  Rationale for Evaluation and Treatment: Rehabilitation  PERTINENT HISTORY: Patient is a 72 y.o. male who presents to outpatient physical therapy with a referral for medical diagnosis lumbar DDD (degenerative disc disease, lumbar stenosis with neurogenic claudication, lumbar radiculitis. This patient's chief complaints consist of chronic pain in the low back and upper glutes that moves up towards his B scapulae with prolonged standing/walking and with intermittent shooting down his R left to the knee, leading to the following functional deficits: difficulty with anything that requires a lot of standing/walking, cooking for church/wine club, cleaning the house, waiting in line, standing longer than 5-20 min, having conversations with others while standing, yard work, Orthoptist, cleaning, sit <> stand, bending and lifting together, staining deck, shopping, walking more than 2 blocks, social participation, changing positions  in the bed, golfing.    Relevant past medical history and comorbidities include asthma, Zenker's (hypopharyngeal) diverticulum, afib, GERD, hx of fatty liver, hypothyroidism, BPH, OA, hx adeomatous polyp of colon, inguinal hernia, L knee pain, hx of dysplastic nevus, colon surgery (7 inches removed of colon), cataract extraction, L knee pain.  Patient denies hx of  cancer, stroke, seizures, lung problem, diabetes, unexplained weight loss, unexplained changes in bowel or bladder problems, unexplained stumbling or dropping things, spinal surgery. He has been exercising daily.     PRECAUTIONS: none  SUBJECTIVE: Patient reports he has felt better in the last 4 days than he has for a long time. He states he saw his new orthopedic doctor (Dr. Oren Section) at [facility] and he really likes him. He gave him a steroid shot in the right knee and recommended he continue PT even if it is for the the back. Patient really liked Dr. Oren Section. He states his right shoulder and back were sore after last PT session. He states he has pain every morning and after he takes his meds he feels better.   PAIN:  Are you having pain? NPRS 0/10  OBJECTIVE   TODAY'S TREATMENT  Therapeutic exercise: to centralize symptoms and improve ROM, strength, muscular endurance, and activity tolerance required for successful completion of functional activities.  - hooklying LTR with legs on clear theraball, self -selected pace for 2 min.  - hooklying wide leg low trunk rotation with clinician OP at end range 1x10 each side with 3-5 second hold.  - hooklying with no head support, with snow angel with clinician OP with abduction force at overhead motion.  - hooklying bridge with shoulder flexion 2x10, cuing for improved ROM, AAROM with clinician OP for UE into flexion.  - half hooklying curl with hands reaching towards feet, 5x20 seconds (chin tuck hardest).   Superset:  - seated thoracic extension over towel roll in chair with clinician OP, 2x10 with up to 5 second hold.  - sit <> stand with overhead press with 3#DB in each hand. From a2z pad in 18 inch chair. Cuing for improved UE position. Able to complete 2 reps from 18 inch chair before adding pad. 2x10. Pain in right knee.   - standing TKE with black theraband loop, 1x20 with BlackTB loop. R knee only.   - Education on HEP including  handout   Manual therapy: to reduce pain and tissue tension, improve range of motion, neuromodulation, in order to promote improved ability to complete functional activities. - hooklying rotational physiologic mobilization with clear Theraball under legs, 2x30 seconds each side.     Pt required multimodal cuing for proper technique and to facilitate improved neuromuscular control, strength, range of motion, and functional ability resulting in improved performance and form.   PATIENT EDUCATION:  Education details: Exercise purpose/form. Self management techniques.  Reviewed cancelation/no-show policy with patient and confirmed patient has correct phone number for clinic; patient verbalized understanding (01/18/22). Person educated: Patient Education method: Explanation, demonstration, VC, TC Education comprehension: verbalized understanding, demonstrated understanding, and needs further education     HOME EXERCISE PROGRAM: Access Code: [email or web address] URL: [email or web address] Date: 06/26/2022 Prepared by: Rosita Kea  Exercises - Sidelying Thoracic Rotation with Open Book  - 1-2 x daily - 3 sets - 10 reps - 5 seconds hold - Bridge  - 1-2 x daily - 3 sets - 10 reps - 5 seconds hold - Supine Lower Trunk Rotation  - 1 x daily - 3 sets - 15 reps -  Prone Press Up  - 1-2 x daily - 3 sets - 10 reps - Bilateral Bent Leg Lift  - 1-2 x daily - 3 sets - 10 reps - Seated Thoracic Lumbar Extension  - Seated Flexion Stretch with Swiss Ball  - 1 x daily - 1 sets - 20 reps - 5 seconds hold - Baby Cobra Hands Hovering   - 1 x daily - 3 sets - 10 reps - 5 seconds hold - Standing Terminal Knee Extension with Resistance  - 2 x daily - 1 sets - 20 reps - 10 seconds hold   ASSESSMENT:   CLINICAL IMPRESSION: Patient arrives with continued report of improved back pain and knee pain after R knee injection. Session continued to focus on interventions to improve postural ROM and  core/postural/functional strength. He continues to be limited by knee pain and poor postural endurance, LE strength, and incomplete movement. Plan to continue with interventions for pain, ROM, strength, and motor control as tolerated. Patient continues to require assistance from PT to improve ability to participate in exercises. Patient would benefit from continued management of limiting condition by skilled physical therapist to address remaining impairments and functional limitations to work towards stated goals and return to PLOF or maximal functional independence.    Patient has attended 30 physical therapy sessions since starting current episode of care on 12/20/2021. Patient has made significant progress towards goals since starting PT but has not showed much objective improvement towards goals since his last progress note. He does report improved improved standing tolerance up to over 60 min since last progress note when he estimated 15 min and he has reported improved back pain recently after starting Carbidopa-Levodopa about two weeks ago ago for parkinsonism. Patient has had some difficulty with regular visits due to illness, weather, and scheduling issues related to his outside responsibilities. He recently was limited by side effects of the new medication that is getting better.  Patient reports his schedule is more clear over the next month, and he reports being motivated to attend 2x a week where he feels he gets the most benefits. He has been consistent with HEP and feels he gets improved pain relief and function when he does his HEP regularly and comes to PT twice a week. Patient continues to have debilitating low back pain as well as changes in posture and difficulty moving through full ROM. Patient has potential for further improvements in pain and posture with new medication prescribed to help him move better. Due to this change that has likelihood to improve his rehab potential, plan to  continue with PT to continue working towards goals and improved quality of life. Patient would benefit from continued management of limiting condition by skilled physical therapist to address remaining impairments and functional limitations to work towards stated goals and return to PLOF or maximal functional independence.     OBJECTIVE IMPAIRMENTS Abnormal gait, decreased activity tolerance, decreased endurance, decreased mobility, difficulty walking, decreased ROM, decreased strength, hypomobility, impaired perceived functional ability, increased muscle spasms, impaired flexibility, improper body mechanics, postural dysfunction, and pain.    ACTIVITY LIMITATIONS carrying, lifting, bending, standing, bed mobility, dressing, and locomotion level   PARTICIPATION LIMITATIONS: meal prep, cleaning, laundry, interpersonal relationship, shopping, community activity, yard work, church, and   asthma, Zenker's (hypopharyngeal) diverticulum, afib, GERD, hx of fatty liver, hypothyroidism, BPH, OA, hx adeomatous polyp of colon, inguinal hernia, L knee pain, hx of dysplastic nevus, colon surgery (7 inches removed of colon), cataract extraction, L knee  pain   PERSONAL FACTORS Age, Past/current experiences, Time since onset of injury/illness/exacerbation, and 3+ comorbidities:   asthma, Zenker's (hypopharyngeal) diverticulum, afib, GERD, hx of fatty liver, hypothyroidism, BPH, OA, hx adeomatous polyp of colon, inguinal hernia, L knee pain, hx of dysplastic nevus, colon surgery (7 inches removed of colon), cataract extraction, L knee pain are also affecting patient's functional outcome.    REHAB POTENTIAL: Good   CLINICAL DECISION MAKING: Stable/uncomplicated   EVALUATION COMPLEXITY: Low     GOALS: Goals reviewed with patient? yes   SHORT TERM GOALS: Target date: 01/03/2022   Patient will be independent with initial home exercise program for self-management of symptoms. Baseline: Initial HEP to be provided at  visit 2 as appropriate (12/20/21); initial HEP provided visit 2 (12/26/2021);  Goal status: met    LONG TERM GOALS: Target date: 03/14/2022. UPDATED to 06/07/2022 for all unmet goals on 03/15/2022. UPDATED to 09/04/2022 for all unmet goals on 06/12/2022.    Patient will be independent with a long-term home exercise program for self-management of symptoms.  Baseline: Initial HEP to be provided at visit 2 as appropriate (12/20/21); initial HEP provided visit 2 (12/26/2021); participating in HEP (01/23/2022); currently participating (03/15/2022; 06/12/2022);   Goal status: In-progress   2.  Patient will demonstrate improved FOTO to equal or greater than 67 by visit #11 to demonstrate improvement in overall condition and self-reported functional ability.  Baseline: 54 (12/20/21); 62 at visit # 10 (01/23/2022); 65 at visit #18 (03/15/2022); 63 at visit #29 (06/12/2022);  Goal status: In-progress   3.  Patient will ambulate equal or greater than 1200 feet on 6 Minute Walk Test with no increase in symptoms to demonstrate improved community ambulation.  Baseline: to be tested visit 2 as appropriate (12/20/21); 1,225' up to 2/10 NPS (12/26/2021); 1263 feet with no AD, no complaint of pain while walking. Pain at waist when going to sit after walking. R knee pain (01/23/2022); 1347 feet with no AD, stiffening at about  3 min. Pain at right scapula up to 4-5/10. R knee pain (03/15/2022); 1400 feet with no AD, R flank pain increasing near  3 min and traveling to right upper trap region. Increased familiar pain at low back by end of walk NPRS 5/10 (06/12/2022);  Goal status: Met 01/23/2022   4.  Patient will demonstrate the ability to lift 25# box floor to waist 1x10 times in a row with no increase in symptoms to improve his ability to bend and lift things around his home.  Baseline: reports difficulty with bending and lifting items at home  (12/20/21); 3 reps at 25#. Reported pain with each lift at low back increasing to  4/10 at 25# with request to stop (01/23/2022); 1x10 reps at 25#. Reported pain with each lift at low back increasing to 5/10 at 25#. Incomplete stand and occasionally missing touching floor (03/15/2022; 06/12/2022);  Goal status: In-progress   5.  Patient will report the ability to stand/walk equal or greater than 60 min before needing to sit to improve his ability to stand in line, socialize, and cook.  Baseline: 5-20 min (12/20/21); on his feet 60 min with salon pass on his back making hashbrown casserole this morning before he started to get pain, he feels he would not be able to do this without the salon pass (01/23/2022): Has not tested it recently, there is a party coming up where he will be cooking in November (03/15/2022); at least 1 hour before he has to sit down, he  can clean 1 bathroom at a time (06/12/2022);  Goal status: Nearly met.      PLAN: PT FREQUENCY: 1-2x/week   PT DURATION: 12 weeks   PLANNED INTERVENTIONS: Therapeutic exercises, Therapeutic activity, Neuromuscular re-education, Patient/Family education, Joint mobilization, DME instructions, Dry Needling, Electrical stimulation, Spinal mobilization, Cryotherapy, Moist heat, Manual therapy, and Re-evaluation.   PLAN FOR NEXT SESSION: update HEP as appropriate, manual therapy and exercises for improved joint mobility in the spine.  Everlean Alstrom. Graylon Good, PT, DPT 06/26/22, 4:57 PM  [facility] [address] [place], [place] [address] P: [phone] I F: [phone]

## 2022-06-27 ENCOUNTER — Ambulatory Visit (INDEPENDENT_AMBULATORY_CARE_PROVIDER_SITE_OTHER): Payer: Medicare Other | Admitting: Dermatology

## 2022-06-27 DIAGNOSIS — Z4802 Encounter for removal of sutures: Secondary | ICD-10-CM

## 2022-06-27 DIAGNOSIS — L720 Epidermal cyst: Secondary | ICD-10-CM

## 2022-06-27 NOTE — Patient Instructions (Signed)
Due to recent changes in healthcare laws, you may see results of your pathology and/or laboratory studies on MyChart before the doctors have had a chance to review them. We understand that in some cases there may be results that are confusing or concerning to you. Please understand that not all results are received at the same time and often the doctors may need to interpret multiple results in order to provide you with the best plan of care or course of treatment. Therefore, we ask that you please give us 2 business days to thoroughly review all your results before contacting the office for clarification. Should we see a critical lab result, you will be contacted sooner.   If You Need Anything After Your Visit  If you have any questions or concerns for your doctor, please call our main line at [phone] and press option 4 to reach your doctor's medical assistant. If no one answers, please leave a voicemail as directed and we will return your call as soon as possible. Messages left after 4 pm will be answered the following business day.   You may also send us a message via MyChart. We typically respond to MyChart messages within 1-2 business days.  For prescription refills, please ask your pharmacy to contact our office. Our fax number is [phone].  If you have an urgent issue when the clinic is closed that cannot wait until the next business day, you can page your doctor at the number below.    Please note that while we do our best to be available for urgent issues outside of office hours, we are not available 24/7.   If you have an urgent issue and are unable to reach us, you may choose to seek medical care at your doctor's office, retail clinic, urgent care center, or emergency room.  If you have a medical emergency, please immediately call 911 or go to the emergency department.  Pager Numbers  - Dr. Kowalski: [phone]  - Dr. Moye: [phone]  - Dr. Stewart:  [phone]  In the event of inclement weather, please call our main line at [phone] for an update on the status of any delays or closures.  Dermatology Medication Tips: Please keep the boxes that topical medications come in in order to help keep track of the instructions about where and how to use these. Pharmacies typically print the medication instructions only on the boxes and not directly on the medication tubes.   If your medication is too expensive, please contact our office at [phone] option 4 or send us a message through MyChart.   We are unable to tell what your co-pay for medications will be in advance as this is different depending on your insurance coverage. However, we may be able to find a substitute medication at lower cost or fill out paperwork to get insurance to cover a needed medication.   If a prior authorization is required to get your medication covered by your insurance company, please allow us 1-2 business days to complete this process.  Drug prices often vary depending on where the prescription is filled and some pharmacies may offer cheaper prices.  The website www.goodrx.com contains coupons for medications through different pharmacies. The prices here do not account for what the cost may be with help from insurance (it may be cheaper with your insurance), but the website can give you the price if you did not use any insurance.  - You can print the associated coupon and take it with   your prescription to the pharmacy.  - You may also stop by our office during regular business hours and pick up a GoodRx coupon card.  - If you need your prescription sent electronically to a different pharmacy, notify our office through [place] MyChart or by phone at [phone] option 4.     Si Usted Necesita Algo Despus de Su Visita  Tambin puede enviarnos un mensaje a travs de MyChart. Por lo general respondemos a los mensajes de MyChart en el transcurso de 1 a 2  das hbiles.  Para renovar recetas, por favor pida a su farmacia que se ponga en contacto con nuestra oficina. Nuestro nmero de fax es el [phone].  Si tiene un asunto urgente cuando la clnica est cerrada y que no puede esperar hasta el siguiente da hbil, puede llamar/localizar a su doctor(a) al nmero que aparece a continuacin.   Por favor, tenga en cuenta que aunque hacemos todo lo posible para estar disponibles para asuntos urgentes fuera del horario de oficina, no estamos disponibles las 24 horas del da, los 7 das de la semana.   Si tiene un problema urgente y no puede comunicarse con nosotros, puede optar por buscar atencin mdica  en el consultorio de su doctor(a), en una clnica privada, en un centro de atencin urgente o en una sala de emergencias.  Si tiene una emergencia mdica, por favor llame inmediatamente al 911 o vaya a la sala de emergencias.  Nmeros de bper  - Dr. Kowalski: [phone]  - Dra. Moye: [phone]  - Dra. Stewart: [phone]  En caso de inclemencias del tiempo, por favor llame a nuestra lnea principal al [phone] para una actualizacin sobre el estado de cualquier retraso o cierre.  Consejos para la medicacin en dermatologa: Por favor, guarde las cajas en las que vienen los medicamentos de uso tpico para ayudarle a seguir las instrucciones sobre dnde y cmo usarlos. Las farmacias generalmente imprimen las instrucciones del medicamento slo en las cajas y no directamente en los tubos del medicamento.   Si su medicamento es muy caro, por favor, pngase en contacto con nuestra oficina llamando al [phone] y presione la opcin 4 o envenos un mensaje a travs de MyChart.   No podemos decirle cul ser su copago por los medicamentos por adelantado ya que esto es diferente dependiendo de la cobertura de su seguro. Sin embargo, es posible que podamos encontrar un medicamento sustituto a menor costo o llenar un formulario para que el  seguro cubra el medicamento que se considera necesario.   Si se requiere una autorizacin previa para que su compaa de seguros cubra su medicamento, por favor permtanos de 1 a 2 das hbiles para completar este proceso.  Los precios de los medicamentos varan con frecuencia dependiendo del lugar de dnde se surte la receta y alguna farmacias pueden ofrecer precios ms baratos.  El sitio web www.goodrx.com tiene cupones para medicamentos de diferentes farmacias. Los precios aqu no tienen en cuenta lo que podra costar con la ayuda del seguro (puede ser ms barato con su seguro), pero el sitio web puede darle el precio si no utiliz ningn seguro.  - Puede imprimir el cupn correspondiente y llevarlo con su receta a la farmacia.  - Tambin puede pasar por nuestra oficina durante el horario de atencin regular y recoger una tarjeta de cupones de GoodRx.  - Si necesita que su receta se enve electrnicamente a una farmacia diferente, informe a nuestra oficina a travs de MyChart de [place]   o por telfono llamando al [phone] y presione la opcin 4.  

## 2022-06-27 NOTE — Progress Notes (Signed)
   Follow-Up Visit   Subjective  Daniel [name] is a 72 y.o. male who presents for the following: Post op/suture removal (Pathology proven benign cyst of the L cheek, patient is here today for suture removal).  The following portions of the chart were reviewed this encounter and updated as appropriate:   Tobacco  Allergies  Meds  Problems  Med Hx  Surg Hx  Fam Hx     Review of Systems:  No other skin or systemic complaints except as noted in HPI or Assessment and Plan.  Objective  Well appearing patient in no apparent distress; mood and affect are within normal limits.  A focused examination was performed including the face. Relevant physical exam findings are noted in the Assessment and Plan.  L cheek Healing excision site.   Assessment & Plan  Epidermal inclusion cyst L cheek  Encounter for Removal of Sutures - Incision site at the L cheek is clean, dry and intact - Wound cleansed, sutures removed, wound cleansed and steri strips applied.  - Discussed pathology results showing a benign cyst.  - Patient advised to keep steri-strips dry until they fall off. - Scars remodel for a full year. - Once steri-strips fall off, patient can apply over-the-counter silicone scar cream each night to help with scar remodeling if desired. - Patient advised to call with any concerns or if they notice any new or changing lesions.   Return for appointment as scheduled.  Luther Redo, CMA, am acting as scribe for Sarina Ser, MD . Documentation: I have reviewed the above documentation for accuracy and completeness, and I agree with the above.  Sarina Ser, MD

## 2022-06-28 ENCOUNTER — Encounter: Payer: Self-pay | Admitting: Physical Therapy

## 2022-06-28 ENCOUNTER — Other Ambulatory Visit (HOSPITAL_COMMUNITY): Payer: Self-pay | Admitting: Neurology

## 2022-06-28 ENCOUNTER — Ambulatory Visit: Payer: Medicare Other | Admitting: Physical Therapy

## 2022-06-28 ENCOUNTER — Encounter: Payer: Self-pay | Admitting: Dermatology

## 2022-06-28 DIAGNOSIS — R293 Abnormal posture: Secondary | ICD-10-CM | POA: Diagnosis not present

## 2022-06-28 DIAGNOSIS — R262 Difficulty in walking, not elsewhere classified: Secondary | ICD-10-CM

## 2022-06-28 DIAGNOSIS — M546 Pain in thoracic spine: Secondary | ICD-10-CM

## 2022-06-28 DIAGNOSIS — G20C Parkinsonism, unspecified: Secondary | ICD-10-CM

## 2022-06-28 DIAGNOSIS — M545 Low back pain, unspecified: Secondary | ICD-10-CM | POA: Diagnosis not present

## 2022-06-28 DIAGNOSIS — R269 Unspecified abnormalities of gait and mobility: Secondary | ICD-10-CM

## 2022-06-28 DIAGNOSIS — G8929 Other chronic pain: Secondary | ICD-10-CM | POA: Diagnosis not present

## 2022-06-28 DIAGNOSIS — M5459 Other low back pain: Secondary | ICD-10-CM | POA: Diagnosis not present

## 2022-06-28 NOTE — Therapy (Signed)
OUTPATIENT PHYSICAL THERAPY TREATMENT NOTE   Patient Name: Daniel [name] MRN: [id number] DOB:1950-10-22, 72 y.o., male Today's Date: 06/28/2022  PCP: Jerrol Banana., MD REFERRING PROVIDER: Allene Dillon, NP  END OF SESSION:   PT End of Session - 06/28/22 1658     Visit Number 32    Number of Visits 41    Date for PT Re-Evaluation 09/04/22    Authorization Type [employer] MEDICARE reporting period from 06/19/2022    Progress Note Due on Visit 30    PT Start Time 1602    PT Stop Time 1642    PT Time Calculation (min) 40 min    Equipment Utilized During Treatment Gait belt    Activity Tolerance Patient tolerated treatment well    Behavior During Therapy [facility] for tasks assessed/performed             Past Medical History:  Diagnosis Date   Actinic keratosis    Asthma    Eczema    Hx of dysplastic nevus 06/06/2006   Mid lower back, slight atypia   Hypothyroidism    Seasonal allergies    Thyroid disease    Past Surgical History:  Procedure Laterality Date   CATARACT EXTRACTION     COLON SURGERY     COLONOSCOPY WITH PROPOFOL N/A 11/18/2014   Procedure: COLONOSCOPY WITH PROPOFOL;  Surgeon: [name], MD;  Location: [place];  Service: Endoscopy;  Laterality: N/A;   COLONOSCOPY WITH PROPOFOL N/A 12/13/2021   Procedure: COLONOSCOPY WITH PROPOFOL;  Surgeon: [name], MD;  Location: [facility] ENDOSCOPY;  Service: Endoscopy;  Laterality: N/A;   EYE SURGERY     KNEE SURGERY     NASAL SEPTUM SURGERY     Patient Active Problem List   Diagnosis Date Noted   Chronic idiopathic constipation 06/05/2022   Left lower quadrant abdominal pain 06/05/2022   Paroxysmal atrial fibrillation ([place]) 03/28/2022   Idiopathic hypotension 03/28/2022   Spondylolysis, lumbar region 03/28/2022   Avitaminosis D 03/28/2022   Myalgia due to statin 03/28/2022   Acquired thrombophilia ([place]) 03/28/2022   Hyperlipidemia 03/28/2022   Abnormal gait 03/28/2022   Mild intermittent  asthma without complication 40/98/1191   Zenker's (hypopharyngeal) diverticulum 03/22/2018   Gastroesophageal reflux disease 03/22/2018   Fatty liver 10/09/2017   BPH (benign prostatic hyperplasia) 11/21/2014   Hypothyroidism 11/21/2014   Inguinal hernia 11/21/2014   OA (osteoarthritis) 11/21/2014   H/O adenomatous polyp of colon 10/14/2014    REFERRING DIAG: lumbar DDD (degenerative disc disease), lumbar stenosis with neurogenic claudication, lumbar radiculitis  THERAPY DIAG:  Other low back pain  Pain in thoracic spine  Abnormal posture  Difficulty in walking, not elsewhere classified  Rationale for Evaluation and Treatment: Rehabilitation  PERTINENT HISTORY: Patient is a 72 y.o. male who presents to outpatient physical therapy with a referral for medical diagnosis lumbar DDD (degenerative disc disease, lumbar stenosis with neurogenic claudication, lumbar radiculitis. This patient's chief complaints consist of chronic pain in the low back and upper glutes that moves up towards his B scapulae with prolonged standing/walking and with intermittent shooting down his R left to the knee, leading to the following functional deficits: difficulty with anything that requires a lot of standing/walking, cooking for church/wine club, cleaning the house, waiting in line, standing longer than 5-20 min, having conversations with others while standing, yard work, Orthoptist, cleaning, sit <> stand, bending and lifting together, staining deck, shopping, walking more than 2 blocks, social participation, changing positions in the  bed, golfing.    Relevant past medical history and comorbidities include asthma, Zenker's (hypopharyngeal) diverticulum, afib, GERD, hx of fatty liver, hypothyroidism, BPH, OA, hx adeomatous polyp of colon, inguinal hernia, L knee pain, hx of dysplastic nevus, colon surgery (7 inches removed of colon), cataract extraction, L knee pain.  Patient denies hx of cancer,  stroke, seizures, lung problem, diabetes, unexplained weight loss, unexplained changes in bowel or bladder problems, unexplained stumbling or dropping things, spinal surgery. He has been exercising daily.     PRECAUTIONS: none  SUBJECTIVE: Patient reports he is doing well and does not have pain by now but had his usual low back stiffness and pain earlier today. He states his brain MRI is scheduled next Friday.   PAIN:  Are you having pain? NPRS 0/10 just some stiffness.   OBJECTIVE   TODAY'S TREATMENT  Therapeutic exercise: to centralize symptoms and improve ROM, strength, muscular endurance, and activity tolerance required for successful completion of functional activities.  - standing TKE with black theraband loop, 1x20 with SilverTB loop. R knee only. Cuing for improved body position and hold time (5 seconds).  - forward step to kickstand with back heel lifted with twist each direction while holding 5#DB in front of body. CGA, cuing for increased range of motion as well as keeping elbows as extended as possible. 2x10 feet.  - modified hang from lat pull bar, 5x15 seconds (limited by pt's concern for fatigue at hands and R knee discomfort).   Superset:  - seated thoracic extension over towel roll in chair with clinician OP, 3x10 with up to 5 second hold.  - sit <> stand with overhead press with 3#DB in each hand. From a2z pad in 18 inch chair with airex pad on it.. Cuing for improved UE position and encouragement to complete.   - standing shoulder extension from greater than 90 degrees to hips with lat pull bar while activating abdominal muscles, 3x10 with 20#. Self selected seated rest after 2nd set.   - half hooklying curl with hands reaching towards feet, 5x20 seconds (chin tuck hardest). - hooklying bridge with shoulder flexion 2x10, cuing for improved ROM.   Pt required multimodal cuing for proper technique and to facilitate improved neuromuscular control, strength, range of motion,  and functional ability resulting in improved performance and form.   PATIENT EDUCATION:  Education details: Exercise purpose/form. Self management techniques.  Reviewed cancelation/no-show policy with patient and confirmed patient has correct phone number for clinic; patient verbalized understanding (01/18/22). Person educated: Patient Education method: Explanation, demonstration, VC, TC Education comprehension: verbalized understanding, demonstrated understanding, and needs further education     HOME EXERCISE PROGRAM: Access Code: [email or web address] URL: [email or web address] Date: 06/26/2022 Prepared by: Rosita Kea  Exercises - Sidelying Thoracic Rotation with Open Book  - 1-2 x daily - 3 sets - 10 reps - 5 seconds hold - Bridge  - 1-2 x daily - 3 sets - 10 reps - 5 seconds hold - Supine Lower Trunk Rotation  - 1 x daily - 3 sets - 15 reps - Prone Press Up  - 1-2 x daily - 3 sets - 10 reps - Bilateral Bent Leg Lift  - 1-2 x daily - 3 sets - 10 reps - Seated Thoracic Lumbar Extension  - Seated Flexion Stretch with Swiss Ball  - 1 x daily - 1 sets - 20 reps - 5 seconds hold - Baby Cobra Hands Hovering   - 1 x daily - 3 sets - 10 reps -  5 seconds hold - Standing Terminal Knee Extension with Resistance  - 2 x daily - 1 sets - 20 reps - 10 seconds hold   ASSESSMENT:   CLINICAL IMPRESSION: Patient arrives with continued complaint of morning low back pain and difficulty with posture. Session continued with functional/LE/trunk strengthening, balance/motor control activities, and stretching to help improve posture and functional ability. Patient reported no increase in pain by end of session. He also improved in stiffness and discomfort with stretching by end of session. He continues to want to speed through repetitions and lack full ROM in most movements despite cuing to slow down and move through larger range. Patient would benefit from continued management of limiting condition by  skilled physical therapist to address remaining impairments and functional limitations to work towards stated goals and return to PLOF or maximal functional independence.   OBJECTIVE IMPAIRMENTS Abnormal gait, decreased activity tolerance, decreased endurance, decreased mobility, difficulty walking, decreased ROM, decreased strength, hypomobility, impaired perceived functional ability, increased muscle spasms, impaired flexibility, improper body mechanics, postural dysfunction, and pain.    ACTIVITY LIMITATIONS carrying, lifting, bending, standing, bed mobility, dressing, and locomotion level   PARTICIPATION LIMITATIONS: meal prep, cleaning, laundry, interpersonal relationship, shopping, community activity, yard work, church, and   asthma, Zenker's (hypopharyngeal) diverticulum, afib, GERD, hx of fatty liver, hypothyroidism, BPH, OA, hx adeomatous polyp of colon, inguinal hernia, L knee pain, hx of dysplastic nevus, colon surgery (7 inches removed of colon), cataract extraction, L knee pain   PERSONAL FACTORS Age, Past/current experiences, Time since onset of injury/illness/exacerbation, and 3+ comorbidities:   asthma, Zenker's (hypopharyngeal) diverticulum, afib, GERD, hx of fatty liver, hypothyroidism, BPH, OA, hx adeomatous polyp of colon, inguinal hernia, L knee pain, hx of dysplastic nevus, colon surgery (7 inches removed of colon), cataract extraction, L knee pain are also affecting patient's functional outcome.    REHAB POTENTIAL: Good   CLINICAL DECISION MAKING: Stable/uncomplicated   EVALUATION COMPLEXITY: Low     GOALS: Goals reviewed with patient? yes   SHORT TERM GOALS: Target date: 01/03/2022   Patient will be independent with initial home exercise program for self-management of symptoms. Baseline: Initial HEP to be provided at visit 2 as appropriate (12/20/21); initial HEP provided visit 2 (12/26/2021);  Goal status: met    LONG TERM GOALS: Target date: 03/14/2022. UPDATED to  06/07/2022 for all unmet goals on 03/15/2022. UPDATED to 09/04/2022 for all unmet goals on 06/12/2022.    Patient will be independent with a long-term home exercise program for self-management of symptoms.  Baseline: Initial HEP to be provided at visit 2 as appropriate (12/20/21); initial HEP provided visit 2 (12/26/2021); participating in HEP (01/23/2022); currently participating (03/15/2022; 06/12/2022);   Goal status: In-progress   2.  Patient will demonstrate improved FOTO to equal or greater than 67 by visit #11 to demonstrate improvement in overall condition and self-reported functional ability.  Baseline: 54 (12/20/21); 62 at visit # 10 (01/23/2022); 65 at visit #18 (03/15/2022); 63 at visit #29 (06/12/2022);  Goal status: In-progress   3.  Patient will ambulate equal or greater than 1200 feet on 6 Minute Walk Test with no increase in symptoms to demonstrate improved community ambulation.  Baseline: to be tested visit 2 as appropriate (12/20/21); 1,225' up to 2/10 NPS (12/26/2021); 1263 feet with no AD, no complaint of pain while walking. Pain at waist when going to sit after walking. R knee pain (01/23/2022); 1347 feet with no AD, stiffening at about  3 min. Pain at right scapula  up to 4-5/10. R knee pain (03/15/2022); 1400 feet with no AD, R flank pain increasing near  3 min and traveling to right upper trap region. Increased familiar pain at low back by end of walk NPRS 5/10 (06/12/2022);  Goal status: Met 01/23/2022   4.  Patient will demonstrate the ability to lift 25# box floor to waist 1x10 times in a row with no increase in symptoms to improve his ability to bend and lift things around his home.  Baseline: reports difficulty with bending and lifting items at home  (12/20/21); 3 reps at 25#. Reported pain with each lift at low back increasing to 4/10 at 25# with request to stop (01/23/2022); 1x10 reps at 25#. Reported pain with each lift at low back increasing to 5/10 at 25#. Incomplete stand and  occasionally missing touching floor (03/15/2022; 06/12/2022);  Goal status: In-progress   5.  Patient will report the ability to stand/walk equal or greater than 60 min before needing to sit to improve his ability to stand in line, socialize, and cook.  Baseline: 5-20 min (12/20/21); on his feet 60 min with salon pass on his back making hashbrown casserole this morning before he started to get pain, he feels he would not be able to do this without the salon pass (01/23/2022): Has not tested it recently, there is a party coming up where he will be cooking in November (03/15/2022); at least 1 hour before he has to sit down, he can clean 1 bathroom at a time (06/12/2022);  Goal status: Nearly met.      PLAN: PT FREQUENCY: 1-2x/week   PT DURATION: 12 weeks   PLANNED INTERVENTIONS: Therapeutic exercises, Therapeutic activity, Neuromuscular re-education, Patient/Family education, Joint mobilization, DME instructions, Dry Needling, Electrical stimulation, Spinal mobilization, Cryotherapy, Moist heat, Manual therapy, and Re-evaluation.   PLAN FOR NEXT SESSION: update HEP as appropriate, manual therapy and exercises for improved joint mobility in the spine.  Everlean Alstrom. Graylon Good, PT, DPT 06/28/22, 5:23 PM  [facility] [address] [place], [place] [address] P: [phone] I F: [phone]

## 2022-06-30 ENCOUNTER — Encounter: Payer: Self-pay | Admitting: Dermatology

## 2022-07-03 ENCOUNTER — Ambulatory Visit: Payer: Medicare Other | Admitting: Urology

## 2022-07-03 ENCOUNTER — Encounter: Payer: Self-pay | Admitting: Urology

## 2022-07-03 VITALS — BP 123/78 | HR 54 | Ht 74.0 in | Wt 200.0 lb

## 2022-07-03 DIAGNOSIS — N5201 Erectile dysfunction due to arterial insufficiency: Secondary | ICD-10-CM | POA: Diagnosis not present

## 2022-07-03 DIAGNOSIS — N401 Enlarged prostate with lower urinary tract symptoms: Secondary | ICD-10-CM | POA: Diagnosis not present

## 2022-07-03 DIAGNOSIS — Z125 Encounter for screening for malignant neoplasm of prostate: Secondary | ICD-10-CM

## 2022-07-05 ENCOUNTER — Ambulatory Visit: Payer: Medicare Other | Attending: Family Medicine | Admitting: Physical Therapy

## 2022-07-05 DIAGNOSIS — R262 Difficulty in walking, not elsewhere classified: Secondary | ICD-10-CM | POA: Insufficient documentation

## 2022-07-05 DIAGNOSIS — M546 Pain in thoracic spine: Secondary | ICD-10-CM | POA: Insufficient documentation

## 2022-07-05 DIAGNOSIS — M5459 Other low back pain: Secondary | ICD-10-CM | POA: Insufficient documentation

## 2022-07-05 DIAGNOSIS — R293 Abnormal posture: Secondary | ICD-10-CM | POA: Diagnosis not present

## 2022-07-05 NOTE — Therapy (Signed)
OUTPATIENT PHYSICAL THERAPY TREATMENT NOTE   Patient Name: Daniel [name] MRN: [id number] DOB:1950/06/14, 72 y.o., male Today's Date: 07/05/2022  PCP: Jerrol Banana., MD REFERRING PROVIDER: Allene Dillon, NP  END OF SESSION:   PT End of Session - 07/05/22 1632     Visit Number 33    Number of Visits 41    Date for PT Re-Evaluation 09/04/22    Authorization Type [employer] MEDICARE reporting period from 06/19/2022    Progress Note Due on Visit 30    PT Start Time 1602    PT Stop Time 1640    PT Time Calculation (min) 38 min    Equipment Utilized During Treatment Gait belt    Activity Tolerance Patient tolerated treatment well    Behavior During Therapy [facility] for tasks assessed/performed              Past Medical History:  Diagnosis Date   Actinic keratosis    Asthma    Eczema    Hx of dysplastic nevus 06/06/2006   Mid lower back, slight atypia   Hypothyroidism    Seasonal allergies    Thyroid disease    Past Surgical History:  Procedure Laterality Date   CATARACT EXTRACTION     COLON SURGERY     COLONOSCOPY WITH PROPOFOL N/A 11/18/2014   Procedure: COLONOSCOPY WITH PROPOFOL;  Surgeon: [name], MD;  Location: [place];  Service: Endoscopy;  Laterality: N/A;   COLONOSCOPY WITH PROPOFOL N/A 12/13/2021   Procedure: COLONOSCOPY WITH PROPOFOL;  Surgeon: [name], MD;  Location: [facility] END[facility];  Service: Endoscopy;  Laterality: N/A;   EYE SURGERY     KNEE SURGERY     NASAL SEPTUM SURGERY     Patient Active Problem List   Diagnosis Date Noted   Chronic idiopathic constipation 06/05/2022   Left lower quadrant abdominal pain 06/05/2022   Paroxysmal atrial fibrillation ([place]) 03/28/2022   Idiopathic hypotension 03/28/2022   Spondylolysis, lumbar region 03/28/2022   Avitaminosis D 03/28/2022   Myalgia due to statin 03/28/2022   Acquired thrombophilia ([place]) 03/28/2022   Hyperlipidemia 03/28/2022   Abnormal gait 03/28/2022   Mild intermittent  asthma without complication 35/32/9924   Zenker's (hypopharyngeal) diverticulum 03/22/2018   Gastroesophageal reflux disease 03/22/2018   Fatty liver 10/09/2017   BPH (benign prostatic hyperplasia) 11/21/2014   Hypothyroidism 11/21/2014   Inguinal hernia 11/21/2014   OA (osteoarthritis) 11/21/2014   H/O adenomatous polyp of colon 10/14/2014    REFERRING DIAG: lumbar DDD (degenerative disc disease), lumbar stenosis with neurogenic claudication, lumbar radiculitis  THERAPY DIAG:  Other low back pain  Pain in thoracic spine  Abnormal posture  Difficulty in walking, not elsewhere classified  Rationale for Evaluation and Treatment: Rehabilitation  PERTINENT HISTORY: Patient is a 72 y.o. male who presents to outpatient physical therapy with a referral for medical diagnosis lumbar DDD (degenerative disc disease, lumbar stenosis with neurogenic claudication, lumbar radiculitis. This patient's chief complaints consist of chronic pain in the low back and upper glutes that moves up towards his B scapulae with prolonged standing/walking and with intermittent shooting down his R left to the knee, leading to the following functional deficits: difficulty with anything that requires a lot of standing/walking, cooking for church/wine club, cleaning the house, waiting in line, standing longer than 5-20 min, having conversations with others while standing, yard work, Orthoptist, cleaning, sit <> stand, bending and lifting together, staining deck, shopping, walking more than 2 blocks, social participation, changing positions in  the bed, golfing.    Relevant past medical history and comorbidities include asthma, Zenker's (hypopharyngeal) diverticulum, afib, GERD, hx of fatty liver, hypothyroidism, BPH, OA, hx adeomatous polyp of colon, inguinal hernia, L knee pain, hx of dysplastic nevus, colon surgery (7 inches removed of colon), cataract extraction, L knee pain.  Patient denies hx of cancer,  stroke, seizures, lung problem, diabetes, unexplained weight loss, unexplained changes in bowel or bladder problems, unexplained stumbling or dropping things, spinal surgery. He has been exercising daily.     PRECAUTIONS: none  SUBJECTIVE: Patient reports he continues to have a lot of morning pain that makes it hard to walk but dissipates after taking meds and sitting still for a while.   PAIN:  Are you having pain? NPRS 2/10 just some stiffness.   OBJECTIVE   TODAY'S TREATMENT  Therapeutic exercise: to centralize symptoms and improve ROM, strength, muscular endurance, and activity tolerance required for successful completion of functional activities.  - standing TKE with black theraband loop, 1x20 with SilverTB loop. R knee only. Cuing for improved body position and hold time (5 seconds).  - forward step to kickstand with back heel lifted with twist each direction while holding 7#DB in front of body. CGA, cuing for technique. 2x20 feet.   Superset:  - seated thoracic extension over towel roll in chair with clinician OP, 3x10 with up to 5 second hold.  - sit <> stand with overhead press with 3#DB in each hand. From a2z pad in 18 inch chair with airex pad on it.. Cuing for improved UE position and encouragement to complete.   - modified hang from lat pull bar, 3x25 seconds (limited by fatigue at hands and R knee discomfort).  - standing shoulder extension from greater than 90 degrees to hips with lat pull bar while activating abdominal muscles, 3x10 with 20/25/25#.   - half hooklying curl with hands reaching towards feet, 5x20 seconds (chin tuck hardest). - hooklying bridge with shoulder flexion 2x10, cuing for improved ROM.   Pt required multimodal cuing for proper technique and to facilitate improved neuromuscular control, strength, range of motion, and functional ability resulting in improved performance and form.   PATIENT EDUCATION:  Education details: Exercise purpose/form. Self  management techniques.  Reviewed cancelation/no-show policy with patient and confirmed patient has correct phone number for clinic; patient verbalized understanding (01/18/22). Person educated: Patient Education method: Explanation, demonstration, VC, TC Education comprehension: verbalized understanding, demonstrated understanding, and needs further education     HOME EXERCISE PROGRAM: Access Code: [email or web address] URL: [email or web address] Date: 06/26/2022 Prepared by: Rosita Kea  Exercises - Sidelying Thoracic Rotation with Open Book  - 1-2 x daily - 3 sets - 10 reps - 5 seconds hold - Bridge  - 1-2 x daily - 3 sets - 10 reps - 5 seconds hold - Supine Lower Trunk Rotation  - 1 x daily - 3 sets - 15 reps - Prone Press Up  - 1-2 x daily - 3 sets - 10 reps - Bilateral Bent Leg Lift  - 1-2 x daily - 3 sets - 10 reps - Seated Thoracic Lumbar Extension  - Seated Flexion Stretch with Swiss Ball  - 1 x daily - 1 sets - 20 reps - 5 seconds hold - Baby Cobra Hands Hovering   - 1 x daily - 3 sets - 10 reps - 5 seconds hold - Standing Terminal Knee Extension with Resistance  - 2 x daily - 1 sets - 20 reps - 10 seconds hold  ASSESSMENT:   CLINICAL IMPRESSION: Patient arrives with continued complaint of morning low back pain and difficulty with posture but overall improvement in back pain during the day. Continued with core/LE/functional strengthening, balance, and ROM exercises to decrease pain. Patient continues to find exercises challenging and continues to require cuing to improve ROM and slow movements for more effective form. Patient fatigues quickly. He has increased low back pain when going from sit to stand by end of session. Patient would benefit from continued management of limiting condition by skilled physical therapist to address remaining impairments and functional limitations to work towards stated goals and return to PLOF or maximal functional independence.    OBJECTIVE  IMPAIRMENTS Abnormal gait, decreased activity tolerance, decreased endurance, decreased mobility, difficulty walking, decreased ROM, decreased strength, hypomobility, impaired perceived functional ability, increased muscle spasms, impaired flexibility, improper body mechanics, postural dysfunction, and pain.    ACTIVITY LIMITATIONS carrying, lifting, bending, standing, bed mobility, dressing, and locomotion level   PARTICIPATION LIMITATIONS: meal prep, cleaning, laundry, interpersonal relationship, shopping, community activity, yard work, church, and   asthma, Zenker's (hypopharyngeal) diverticulum, afib, GERD, hx of fatty liver, hypothyroidism, BPH, OA, hx adeomatous polyp of colon, inguinal hernia, L knee pain, hx of dysplastic nevus, colon surgery (7 inches removed of colon), cataract extraction, L knee pain   PERSONAL FACTORS Age, Past/current experiences, Time since onset of injury/illness/exacerbation, and 3+ comorbidities:   asthma, Zenker's (hypopharyngeal) diverticulum, afib, GERD, hx of fatty liver, hypothyroidism, BPH, OA, hx adeomatous polyp of colon, inguinal hernia, L knee pain, hx of dysplastic nevus, colon surgery (7 inches removed of colon), cataract extraction, L knee pain are also affecting patient's functional outcome.    REHAB POTENTIAL: Good   CLINICAL DECISION MAKING: Stable/uncomplicated   EVALUATION COMPLEXITY: Low     GOALS: Goals reviewed with patient? yes   SHORT TERM GOALS: Target date: 01/03/2022   Patient will be independent with initial home exercise program for self-management of symptoms. Baseline: Initial HEP to be provided at visit 2 as appropriate (12/20/21); initial HEP provided visit 2 (12/26/2021);  Goal status: met    LONG TERM GOALS: Target date: 03/14/2022. UPDATED to 06/07/2022 for all unmet goals on 03/15/2022. UPDATED to 09/04/2022 for all unmet goals on 06/12/2022.    Patient will be independent with a long-term home exercise program for  self-management of symptoms.  Baseline: Initial HEP to be provided at visit 2 as appropriate (12/20/21); initial HEP provided visit 2 (12/26/2021); participating in HEP (01/23/2022); currently participating (03/15/2022; 06/12/2022);   Goal status: In-progress   2.  Patient will demonstrate improved FOTO to equal or greater than 67 by visit #11 to demonstrate improvement in overall condition and self-reported functional ability.  Baseline: 54 (12/20/21); 62 at visit # 10 (01/23/2022); 65 at visit #18 (03/15/2022); 63 at visit #29 (06/12/2022);  Goal status: In-progress   3.  Patient will ambulate equal or greater than 1200 feet on 6 Minute Walk Test with no increase in symptoms to demonstrate improved community ambulation.  Baseline: to be tested visit 2 as appropriate (12/20/21); 1,225' up to 2/10 NPS (12/26/2021); 1263 feet with no AD, no complaint of pain while walking. Pain at waist when going to sit after walking. R knee pain (01/23/2022); 1347 feet with no AD, stiffening at about  3 min. Pain at right scapula up to 4-5/10. R knee pain (03/15/2022); 1400 feet with no AD, R flank pain increasing near  3 min and traveling to right upper trap region. Increased familiar pain at  low back by end of walk NPRS 5/10 (06/12/2022);  Goal status: Met 01/23/2022   4.  Patient will demonstrate the ability to lift 25# box floor to waist 1x10 times in a row with no increase in symptoms to improve his ability to bend and lift things around his home.  Baseline: reports difficulty with bending and lifting items at home  (12/20/21); 3 reps at 25#. Reported pain with each lift at low back increasing to 4/10 at 25# with request to stop (01/23/2022); 1x10 reps at 25#. Reported pain with each lift at low back increasing to 5/10 at 25#. Incomplete stand and occasionally missing touching floor (03/15/2022; 06/12/2022);  Goal status: In-progress   5.  Patient will report the ability to stand/walk equal or greater than 60 min before  needing to sit to improve his ability to stand in line, socialize, and cook.  Baseline: 5-20 min (12/20/21); on his feet 60 min with salon pass on his back making hashbrown casserole this morning before he started to get pain, he feels he would not be able to do this without the salon pass (01/23/2022): Has not tested it recently, there is a party coming up where he will be cooking in November (03/15/2022); at least 1 hour before he has to sit down, he can clean 1 bathroom at a time (06/12/2022);  Goal status: Nearly met.      PLAN: PT FREQUENCY: 1-2x/week   PT DURATION: 12 weeks   PLANNED INTERVENTIONS: Therapeutic exercises, Therapeutic activity, Neuromuscular re-education, Patient/Family education, Joint mobilization, DME instructions, Dry Needling, Electrical stimulation, Spinal mobilization, Cryotherapy, Moist heat, Manual therapy, and Re-evaluation.   PLAN FOR NEXT SESSION: update HEP as appropriate, manual therapy and exercises for improved joint mobility in the spine.  Everlean Alstrom. Graylon Good, PT, DPT 07/05/22, 4:47 PM  [facility] [address] [place], [place] [address] P: [phone] I F: [phone]

## 2022-07-06 ENCOUNTER — Encounter: Payer: Self-pay | Admitting: Urology

## 2022-07-06 NOTE — Progress Notes (Signed)
07/03/2022 1:04 PM   [name] 1950/09/16 [id number]  Referring provider: Virginia Crews, MD [address] [place],  [place] [address]  Chief Complaint  Patient presents with   Benign Prostatic Hypertrophy    Urologic history:  1.  BPH with LUTS UroLift 12/2017  2.  Erectile dysfunction Sildenafil 60 mg  3.  History abnormal PSA velocity TRUS/biopsy 12/2010 PSA 2.8 with benign pathology   HPI: Daniel [name] is a 72 y.o. male who presents for transfer of urologic care after his urologist recently retired.  Was followed by Dr. Yves Dill for the above problem list He was last seen 01/09/2022 and had no problems.  PSA stable at 1.8 No complaints today   PMH: Past Medical History:  Diagnosis Date   Actinic keratosis    Asthma    Eczema    Hx of dysplastic nevus 06/06/2006   Mid lower back, slight atypia   Hypothyroidism    Seasonal allergies    Thyroid disease     Surgical History: Past Surgical History:  Procedure Laterality Date   CATARACT EXTRACTION     COLON SURGERY     COLONOSCOPY WITH PROPOFOL N/A 11/18/2014   Procedure: COLONOSCOPY WITH PROPOFOL;  Surgeon: [name], MD;  Location: [place];  Service: Endoscopy;  Laterality: N/A;   COLONOSCOPY WITH PROPOFOL N/A 12/13/2021   Procedure: COLONOSCOPY WITH PROPOFOL;  Surgeon: [name], MD;  Location: [facility];  Service: Endoscopy;  Laterality: N/A;   EYE SURGERY     KNEE SURGERY     NASAL SEPTUM SURGERY      Home Medications:  Allergies as of 07/03/2022       Reactions   Ciprofloxacin Hcl Other (See Comments)   Blisters break out   Levaquin [levofloxacin In D5w]    Sulfa Antibiotics Other (See Comments)   unknown        Medication List        Accurate as of July 03, 2022 11:59 PM. If you have any questions, ask your nurse or doctor.          albuterol 108 (90 Base) MCG/ACT inhaler Commonly known as: VENTOLIN HFA Inhale 2 puffs into the  lungs every 6 (six) hours as needed for wheezing or shortness of breath.   carbidopa-levodopa 25-100 MG disintegrating tablet Commonly known as: PARCOPA Take 1 tablet by mouth 3 (three) times daily.   Eliquis 5 MG Tabs tablet Generic drug: apixaban Take 5 mg by mouth 2 (two) times daily.   gabapentin 100 MG capsule Commonly known as: NEURONTIN Take 300 mg by mouth at bedtime.   levothyroxine 100 MCG tablet Commonly known as: SYNTHROID TAKE 1 TABLET BY MOUTH EVERY DAY BEFORE BREAKFAST   metoprolol succinate 25 MG 24 hr tablet Commonly known as: TOPROL-XL Take 25 mg by mouth daily.   mupirocin ointment 2 % Commonly known as: BACTROBAN Apply 1 Application topically daily. Qd to excision site   pantoprazole 40 MG tablet Commonly known as: PROTONIX TAKE 1 TABLET(40 MG) BY MOUTH TWICE DAILY   predniSONE 20 MG tablet Commonly known as: DELTASONE Take 1 tablet (20 mg total) by mouth daily with breakfast.   traMADol 50 MG tablet Commonly known as: ULTRAM Take 50 mg by mouth every 6 (six) hours as needed.        Allergies:  Allergies  Allergen Reactions   Ciprofloxacin Hcl Other (See Comments)    Blisters break out   Levaquin [Levofloxacin In D5w]  Sulfa Antibiotics Other (See Comments)    unknown    Family History: Family History  Problem Relation Age of Onset   Colon cancer Mother    Cervical cancer Mother    Colon polyps Mother    Osteoporosis Mother    Hypertension Father    Leukemia Father    Arthritis Brother    Heart attack Brother    Throat cancer Maternal Grandmother     Social History:  reports that he quit smoking about 33 years ago. His smoking use included cigarettes. He has never used smokeless tobacco. He reports current alcohol use. He reports that he does not use drugs.   Physical Exam: BP 123/78   Pulse (!) 54   Ht '6\' 2"'$  (1.88 m)   Wt 200 lb (90.7 kg)   BMI 25.68 kg/m   Constitutional:  Alert and oriented, No acute  distress. HEENT: [place] AT Respiratory: Normal respiratory effort, no increased work of breathing. Psychiatric: Normal mood and affect.   Assessment & Plan:    1.  BPH with LUTS Doing well status post UroLift  2.  Erectile dysfunction Stable on sildenafil  3.  Prostate cancer screening We discussed current prostate cancer screening recommendations with PSA/DRE until age 39.  We did discuss healthier patients can continue into the mid 70s and he would like to continue annual screening. Follow-up 1 year with PSA   Abbie Sons, MD  [facility] [address], [place], [place] [address] [phone]

## 2022-07-07 ENCOUNTER — Ambulatory Visit (HOSPITAL_COMMUNITY)
Admission: RE | Admit: 2022-07-07 | Discharge: 2022-07-07 | Disposition: A | Discharge status: Home / Self Care | Payer: Medicare Other | Source: Ambulatory Visit | Attending: Neurology | Admitting: Neurology

## 2022-07-07 DIAGNOSIS — G20C Parkinsonism, unspecified: Secondary | ICD-10-CM

## 2022-07-07 DIAGNOSIS — R269 Unspecified abnormalities of gait and mobility: Secondary | ICD-10-CM | POA: Insufficient documentation

## 2022-07-07 DIAGNOSIS — Q283 Other malformations of cerebral vessels: Secondary | ICD-10-CM | POA: Diagnosis not present

## 2022-07-07 DIAGNOSIS — G9389 Other specified disorders of brain: Secondary | ICD-10-CM | POA: Diagnosis not present

## 2022-07-07 DIAGNOSIS — G20A1 Parkinson's disease without dyskinesia, without mention of fluctuations: Secondary | ICD-10-CM | POA: Diagnosis not present

## 2022-07-11 ENCOUNTER — Ambulatory Visit: Payer: Medicare Other | Admitting: Physical Therapy

## 2022-07-12 ENCOUNTER — Ambulatory Visit: Payer: Medicare Other | Admitting: Physical Therapy

## 2022-07-12 ENCOUNTER — Encounter: Payer: Self-pay | Admitting: Physical Therapy

## 2022-07-12 DIAGNOSIS — M546 Pain in thoracic spine: Secondary | ICD-10-CM | POA: Diagnosis not present

## 2022-07-12 DIAGNOSIS — R293 Abnormal posture: Secondary | ICD-10-CM

## 2022-07-12 DIAGNOSIS — M5459 Other low back pain: Secondary | ICD-10-CM | POA: Diagnosis not present

## 2022-07-12 DIAGNOSIS — R262 Difficulty in walking, not elsewhere classified: Secondary | ICD-10-CM | POA: Diagnosis not present

## 2022-07-12 NOTE — Therapy (Signed)
OUTPATIENT PHYSICAL THERAPY TREATMENT NOTE   Patient Name: Daniel [name] MRN: [id number] DOB:01/19/1951, 72 y.o., male Today's Date: 07/12/2022  PCP: Jerrol Banana., MD REFERRING PROVIDER: Allene Dillon, NP  END OF SESSION:   PT End of Session - 07/12/22 1549     Visit Number 34    Number of Visits 41    Date for PT Re-Evaluation 09/04/22    Authorization Type [employer] MEDICARE reporting period from 06/19/2022    Progress Note Due on Visit 40    PT Start Time 1556    PT Stop Time 1636    PT Time Calculation (min) 40 min    Equipment Utilized During Treatment Gait belt    Activity Tolerance Patient tolerated treatment well    Behavior During Therapy [facility] for tasks assessed/performed              Past Medical History:  Diagnosis Date   Actinic keratosis    Asthma    Eczema    Hx of dysplastic nevus 06/06/2006   Mid lower back, slight atypia   Hypothyroidism    Seasonal allergies    Thyroid disease    Past Surgical History:  Procedure Laterality Date   CATARACT EXTRACTION     COLON SURGERY     COLONOSCOPY WITH PROPOFOL N/A 11/18/2014   Procedure: COLONOSCOPY WITH PROPOFOL;  Surgeon: [name], MD;  Location: [facility] ENDOSCOPY;  Service: Endoscopy;  Laterality: N/A;   COLONOSCOPY WITH PROPOFOL N/A 12/13/2021   Procedure: COLONOSCOPY WITH PROPOFOL;  Surgeon: [name], MD;  Location: [facility] ENDOSCOPY;  Service: Endoscopy;  Laterality: N/A;   EYE SURGERY     KNEE SURGERY     NASAL SEPTUM SURGERY     Patient Active Problem List   Diagnosis Date Noted   Chronic idiopathic constipation 06/05/2022   Left lower quadrant abdominal pain 06/05/2022   Paroxysmal atrial fibrillation ([place]) 03/28/2022   Idiopathic hypotension 03/28/2022   Spondylolysis, lumbar region 03/28/2022   Avitaminosis D 03/28/2022   Myalgia due to statin 03/28/2022   Acquired thrombophilia ([place]) 03/28/2022   Hyperlipidemia 03/28/2022   Abnormal gait 03/28/2022   Mild intermittent  asthma without complication [id number]   Zenker's (hypopharyngeal) diverticulum 03/22/2018   Gastroesophageal reflux disease 03/22/2018   Fatty liver 10/09/2017   BPH (benign prostatic hyperplasia) 11/21/2014   Hypothyroidism 11/21/2014   Inguinal hernia 11/21/2014   OA (osteoarthritis) 11/21/2014   H/O adenomatous polyp of colon 10/14/2014    REFERRING DIAG: lumbar DDD (degenerative disc disease), lumbar stenosis with neurogenic claudication, lumbar radiculitis  THERAPY DIAG:  Other low back pain  Pain in thoracic spine  Abnormal posture  Difficulty in walking, not elsewhere classified  Rationale for Evaluation and Treatment: Rehabilitation  PERTINENT HISTORY: Patient is a 72 y.o. male who presents to outpatient physical therapy with a referral for medical diagnosis lumbar DDD (degenerative disc disease, lumbar stenosis with neurogenic claudication, lumbar radiculitis. This patient's chief complaints consist of chronic pain in the low back and upper glutes that moves up towards his B scapulae with prolonged standing/walking and with intermittent shooting down his R left to the knee, leading to the following functional deficits: difficulty with anything that requires a lot of standing/walking, cooking for church/wine club, cleaning the house, waiting in line, standing longer than 5-20 min, having conversations with others while standing, yard work, Orthoptist, cleaning, sit <> stand, bending and lifting together, staining deck, shopping, walking more than 2 blocks, social participation, changing positions in  the bed, golfing.    Relevant past medical history and comorbidities include asthma, Zenker's (hypopharyngeal) diverticulum, afib, GERD, hx of fatty liver, hypothyroidism, BPH, OA, hx adeomatous polyp of colon, inguinal hernia, L knee pain, hx of dysplastic nevus, colon surgery (7 inches removed of colon), cataract extraction, L knee pain.  Patient denies hx of cancer,  stroke, seizures, lung problem, diabetes, unexplained weight loss, unexplained changes in bowel or bladder problems, unexplained stumbling or dropping things, spinal surgery. He has been exercising daily.     PRECAUTIONS: none  SUBJECTIVE: Patient reports he is feeling pretty good. His right shoulder is sore and his back hurts a little. Pain continues to be much more intense in the back to get going. R knee is doing well. He states the radiology report showed his brain MRI was unremarkable. He has not spoken to Dr. Manuella Ghazi about it yet. He states if it would warm up he thinks he could do some walking up to half a mile.   PAIN:  Are you having pain? NPRS 1-2/10 familiar low back pain,  1-2/10 R shoulder.   OBJECTIVE   TODAY'S TREATMENT  Therapeutic exercise: to centralize symptoms and improve ROM, strength, muscular endurance, and activity tolerance required for successful completion of functional activities.  -  alternating lateral stepping over small aerobic step with 5#AW on each foot, B UE support, 3x10 each side.  - forward step to kickstand with back heel lifted with twist each direction while holding 10#DB in front of body. CGA, cuing for technique. 3x20 feet.  - modified hang from lat pull bar, 3x30 seconds (reports LE fatigue).  Superset:  - seated thoracic extension over towel roll in chair with clinician OP, 3x10 with up to 5 second hold.  - sit <> stand with overhead press with 4#DB in each hand. From a2z pad in 18 inch chair, 3x10/7/10. Cuing for improved UE position and encouragement to complete. Initial 4 reps from chair without pad in it. Limited most by LE fatigue.   - standing B shoulder flexion with PVC stick loaded with AW while standing on airex pad. 3x10 at 10/05/08#. CGA - standing shoulder extension from greater than 90 degrees to hips with lat pull bar while activating abdominal muscles, 3x10 with 25#.  Cuing to stay tall with torso - hooklying bridge with no pillow 1x30,    Pt required multimodal cuing for proper technique and to facilitate improved neuromuscular control, strength, range of motion, and functional ability resulting in improved performance and form.   PATIENT EDUCATION:  Education details: Exercise purpose/form. Self management techniques.  Reviewed cancelation/no-show policy with patient and confirmed patient has correct phone number for clinic; patient verbalized understanding (01/18/22). Person educated: Patient Education method: Explanation, demonstration, VC, TC Education comprehension: verbalized understanding, demonstrated understanding, and needs further education     HOME EXERCISE PROGRAM: Access Code: [email or web address] URL: [email or web address] Date: 06/26/2022 Prepared by: Rosita Kea  Exercises - Sidelying Thoracic Rotation with Open Book  - 1-2 x daily - 3 sets - 10 reps - 5 seconds hold - Bridge  - 1-2 x daily - 3 sets - 10 reps - 5 seconds hold - Supine Lower Trunk Rotation  - 1 x daily - 3 sets - 15 reps - Prone Press Up  - 1-2 x daily - 3 sets - 10 reps - Bilateral Bent Leg Lift  - 1-2 x daily - 3 sets - 10 reps - Seated Thoracic Lumbar Extension  - Seated Flexion Stretch with Swiss Ball  -  1 x daily - 1 sets - 20 reps - 5 seconds hold - Baby Cobra Hands Hovering   - 1 x daily - 3 sets - 10 reps - 5 seconds hold - Standing Terminal Knee Extension with Resistance  - 2 x daily - 1 sets - 20 reps - 10 seconds hold   ASSESSMENT:   CLINICAL IMPRESSION: Patient arrives with continued complaint of morning low back pain  but appears to be doing a little better. He was able to complete more tasking functional activities today that were slight progression from before. Integrated functional as well as balance exercises as well as those for improved posture and back pain. Patient provided with handout about exercise for people with Parkinson's. Patient would benefit from continued management of limiting condition by skilled  physical therapist to address remaining impairments and functional limitations to work towards stated goals and return to PLOF or maximal functional independence.   OBJECTIVE IMPAIRMENTS Abnormal gait, decreased activity tolerance, decreased endurance, decreased mobility, difficulty walking, decreased ROM, decreased strength, hypomobility, impaired perceived functional ability, increased muscle spasms, impaired flexibility, improper body mechanics, postural dysfunction, and pain.    ACTIVITY LIMITATIONS carrying, lifting, bending, standing, bed mobility, dressing, and locomotion level   PARTICIPATION LIMITATIONS: meal prep, cleaning, laundry, interpersonal relationship, shopping, community activity, yard work, church, and   asthma, Zenker's (hypopharyngeal) diverticulum, afib, GERD, hx of fatty liver, hypothyroidism, BPH, OA, hx adeomatous polyp of colon, inguinal hernia, L knee pain, hx of dysplastic nevus, colon surgery (7 inches removed of colon), cataract extraction, L knee pain   PERSONAL FACTORS Age, Past/current experiences, Time since onset of injury/illness/exacerbation, and 3+ comorbidities:   asthma, Zenker's (hypopharyngeal) diverticulum, afib, GERD, hx of fatty liver, hypothyroidism, BPH, OA, hx adeomatous polyp of colon, inguinal hernia, L knee pain, hx of dysplastic nevus, colon surgery (7 inches removed of colon), cataract extraction, L knee pain are also affecting patient's functional outcome.    REHAB POTENTIAL: Good   CLINICAL DECISION MAKING: Stable/uncomplicated   EVALUATION COMPLEXITY: Low     GOALS: Goals reviewed with patient? yes   SHORT TERM GOALS: Target date: 01/03/2022   Patient will be independent with initial home exercise program for self-management of symptoms. Baseline: Initial HEP to be provided at visit 2 as appropriate (12/20/21); initial HEP provided visit 2 (12/26/2021);  Goal status: met    LONG TERM GOALS: Target date: 03/14/2022. UPDATED to 06/07/2022  for all unmet goals on 03/15/2022. UPDATED to 09/04/2022 for all unmet goals on 06/12/2022.    Patient will be independent with a long-term home exercise program for self-management of symptoms.  Baseline: Initial HEP to be provided at visit 2 as appropriate (12/20/21); initial HEP provided visit 2 (12/26/2021); participating in HEP (01/23/2022); currently participating (03/15/2022; 06/12/2022);   Goal status: In-progress   2.  Patient will demonstrate improved FOTO to equal or greater than 67 by visit #11 to demonstrate improvement in overall condition and self-reported functional ability.  Baseline: 54 (12/20/21); 62 at visit # 10 (01/23/2022); 65 at visit #18 (03/15/2022); 63 at visit #29 (06/12/2022);  Goal status: In-progress   3.  Patient will ambulate equal or greater than 1200 feet on 6 Minute Walk Test with no increase in symptoms to demonstrate improved community ambulation.  Baseline: to be tested visit 2 as appropriate (12/20/21); 1,225' up to 2/10 NPS (12/26/2021); 1263 feet with no AD, no complaint of pain while walking. Pain at waist when going to sit after walking. R knee pain (01/23/2022); 1347  feet with no AD, stiffening at about  3 min. Pain at right scapula up to 4-5/10. R knee pain (03/15/2022); 1400 feet with no AD, R flank pain increasing near  3 min and traveling to right upper trap region. Increased familiar pain at low back by end of walk NPRS 5/10 (06/12/2022);  Goal status: Met 01/23/2022   4.  Patient will demonstrate the ability to lift 25# box floor to waist 1x10 times in a row with no increase in symptoms to improve his ability to bend and lift things around his home.  Baseline: reports difficulty with bending and lifting items at home  (12/20/21); 3 reps at 25#. Reported pain with each lift at low back increasing to 4/10 at 25# with request to stop (01/23/2022); 1x10 reps at 25#. Reported pain with each lift at low back increasing to 5/10 at 25#. Incomplete stand and occasionally  missing touching floor (03/15/2022; 06/12/2022);  Goal status: In-progress   5.  Patient will report the ability to stand/walk equal or greater than 60 min before needing to sit to improve his ability to stand in line, socialize, and cook.  Baseline: 5-20 min (12/20/21); on his feet 60 min with salon pass on his back making hashbrown casserole this morning before he started to get pain, he feels he would not be able to do this without the salon pass (01/23/2022): Has not tested it recently, there is a party coming up where he will be cooking in November (03/15/2022); at least 1 hour before he has to sit down, he can clean 1 bathroom at a time (06/12/2022);  Goal status: Nearly met.      PLAN: PT FREQUENCY: 1-2x/week   PT DURATION: 12 weeks   PLANNED INTERVENTIONS: Therapeutic exercises, Therapeutic activity, Neuromuscular re-education, Patient/Family education, Joint mobilization, DME instructions, Dry Needling, Electrical stimulation, Spinal mobilization, Cryotherapy, Moist heat, Manual therapy, and Re-evaluation.   PLAN FOR NEXT SESSION: update HEP as appropriate, manual therapy and exercises for improved joint mobility in the spine.  Everlean Alstrom. Graylon Good, PT, DPT 07/12/22, 4:41 PM  [facility] [address] [place], [place] [address] P: [phone] I F: [phone]

## 2022-07-18 ENCOUNTER — Encounter: Payer: Medicare Other | Admitting: Physical Therapy

## 2022-07-18 DIAGNOSIS — R269 Unspecified abnormalities of gait and mobility: Secondary | ICD-10-CM | POA: Diagnosis not present

## 2022-07-18 DIAGNOSIS — G20C Parkinsonism, unspecified: Secondary | ICD-10-CM | POA: Diagnosis not present

## 2022-07-19 ENCOUNTER — Ambulatory Visit: Payer: Medicare Other | Admitting: Physical Therapy

## 2022-07-19 ENCOUNTER — Encounter: Payer: Self-pay | Admitting: Physical Therapy

## 2022-07-19 DIAGNOSIS — M5459 Other low back pain: Secondary | ICD-10-CM | POA: Diagnosis not present

## 2022-07-19 DIAGNOSIS — R262 Difficulty in walking, not elsewhere classified: Secondary | ICD-10-CM

## 2022-07-19 DIAGNOSIS — R293 Abnormal posture: Secondary | ICD-10-CM | POA: Diagnosis not present

## 2022-07-19 DIAGNOSIS — M546 Pain in thoracic spine: Secondary | ICD-10-CM

## 2022-07-19 NOTE — Therapy (Signed)
OUTPATIENT PHYSICAL THERAPY TREATMENT NOTE   Patient Name: Daniel [name] MRN: [id number] DOB:[birth year], 72 y.o., male Today's Date: 07/19/2022  PCP: Jerrol Banana., MD REFERRING PROVIDER: Allene Dillon, NP  END OF SESSION:   PT End of Session - 07/19/22 1722     Visit Number 35    Number of Visits 41    Date for PT Re-Evaluation 09/04/22    Authorization Type [employer] MEDICARE reporting period from 06/19/2022    Progress Note Due on Visit 40    PT Start Time 1650    PT Stop Time 1728    PT Time Calculation (min) 38 min    Equipment Utilized During Treatment Gait belt    Activity Tolerance Patient tolerated treatment well    Behavior During Therapy WFL for tasks assessed/performed               Past Medical History:  Diagnosis Date   Actinic keratosis    Asthma    Eczema    Hx of dysplastic nevus 06/06/2006   Mid lower back, slight atypia   Hypothyroidism    Seasonal allergies    Thyroid disease    Past Surgical History:  Procedure Laterality Date   CATARACT EXTRACTION     COLON SURGERY     COLONOSCOPY WITH PROPOFOL N/A 11/18/2014   Procedure: COLONOSCOPY WITH PROPOFOL;  Surgeon: [name], MD;  Location: [place];  Service: Endoscopy;  Laterality: N/A;   COLONOSCOPY WITH PROPOFOL N/A 12/13/2021   Procedure: COLONOSCOPY WITH PROPOFOL;  Surgeon: [name], MD;  Location: [facility];  Service: Endoscopy;  Laterality: N/A;   EYE SURGERY     KNEE SURGERY     NASAL SEPTUM SURGERY     Patient Active Problem List   Diagnosis Date Noted   Chronic idiopathic constipation 06/05/2022   Left lower quadrant abdominal pain 06/05/2022   Paroxysmal atrial fibrillation ([place]) 03/28/2022   Idiopathic hypotension 03/28/2022   Spondylolysis, lumbar region 03/28/2022   Avitaminosis D 03/28/2022   Myalgia due to statin 03/28/2022   Acquired thrombophilia ([place]) 03/28/2022   Hyperlipidemia 03/28/2022   Abnormal gait 03/28/2022   Mild  intermittent asthma without complication [id number]   Zenker's (hypopharyngeal) diverticulum 03/22/2018   Gastroesophageal reflux disease 03/22/2018   Fatty liver 10/09/2017   BPH (benign prostatic hyperplasia) 11/21/2014   Hypothyroidism 11/21/2014   Inguinal hernia 11/21/2014   OA (osteoarthritis) 11/21/2014   H/O adenomatous polyp of colon 10/14/2014    REFERRING DIAG: lumbar DDD (degenerative disc disease), lumbar stenosis with neurogenic claudication, lumbar radiculitis  THERAPY DIAG:  Other low back pain  Pain in thoracic spine  Abnormal posture  Difficulty in walking, not elsewhere classified  Rationale for Evaluation and Treatment: Rehabilitation  PERTINENT HISTORY: Patient is a 72 y.o. male who presents to outpatient physical therapy with a referral for medical diagnosis lumbar DDD (degenerative disc disease, lumbar stenosis with neurogenic claudication, lumbar radiculitis. This patient's chief complaints consist of chronic pain in the low back and upper glutes that moves up towards his B scapulae with prolonged standing/walking and with intermittent shooting down his R left to the knee, leading to the following functional deficits: difficulty with anything that requires a lot of standing/walking, cooking for church/wine club, cleaning the house, waiting in line, standing longer than 5-20 min, having conversations with others while standing, yard work, Orthoptist, cleaning, sit <> stand, bending and lifting together, staining deck, shopping, walking more than 2 blocks, social participation, changing positions  in the bed, golfing.    Relevant past medical history and comorbidities include asthma, Zenker's (hypopharyngeal) diverticulum, afib, GERD, hx of fatty liver, hypothyroidism, BPH, OA, hx adeomatous polyp of colon, inguinal hernia, L knee pain, hx of dysplastic nevus, colon surgery (7 inches removed of colon), cataract extraction, L knee pain.  Patient denies hx of  cancer, stroke, seizures, lung problem, diabetes, unexplained weight loss, unexplained changes in bowel or bladder problems, unexplained stumbling or dropping things, spinal surgery. He has been exercising daily.     PRECAUTIONS: none  SUBJECTIVE: Patient reports his back has been doing pretty good except he still has 9-10/10 pain in the morning until he starts to feel better in 1 hour  after taking meds and getting going. Laying on his back for 15 min helps him too. He was sore in his shoulder after last PT session but it didn't last long. He feels like his legs are getting stronger and he feels like he may be able to walk a half mile now.   PAIN:  Are you having pain? NPRS 0/10 OBJECTIVE   TODAY'S TREATMENT  Therapeutic exercise: to centralize symptoms and improve ROM, strength, muscular endurance, and activity tolerance required for successful completion of functional activities.  -  alternating lateral stepping over 8 inch small aerobic step with 5#AW on each foot, B UE support, 3x10 each side.  - forward step to kickstand with back heel lifted with twist each direction while holding 15#DB in front of body. CGA, cuing for technique. 3x20 feet.  - modified hang from lat pull bar, 3x30 seconds (reports LE fatigue, R knee pain).  Superset:  - seated thoracic extension over towel roll in chair with clinician OP, 3x10 with up to 5 second hold.  - sit <> stand with overhead press with 5#DB in each hand. From a2z pad in 18 inch chair, 3x10. Cuing for improved UE position and encouragement to complete. Limited most by LE fatigue.   - standing B shoulder flexion with PVC stick loaded with 10#AW while standing on airex pad. 1x10 CGA   Pt required multimodal cuing for proper technique and to facilitate improved neuromuscular control, strength, range of motion, and functional ability resulting in improved performance and form.   PATIENT EDUCATION:  Education details: Exercise purpose/form. Self  management techniques.  Reviewed cancelation/no-show policy with patient and confirmed patient has correct phone number for clinic; patient verbalized understanding (01/18/22). Person educated: Patient Education method: Explanation, demonstration, VC, TC Education comprehension: verbalized understanding, demonstrated understanding, and needs further education     HOME EXERCISE PROGRAM: Access Code: [email or web address] URL: [email or web address] Date: 06/26/2022 Prepared by: Rosita Kea  Exercises - Sidelying Thoracic Rotation with Open Book  - 1-2 x daily - 3 sets - 10 reps - 5 seconds hold - Bridge  - 1-2 x daily - 3 sets - 10 reps - 5 seconds hold - Supine Lower Trunk Rotation  - 1 x daily - 3 sets - 15 reps - Prone Press Up  - 1-2 x daily - 3 sets - 10 reps - Bilateral Bent Leg Lift  - 1-2 x daily - 3 sets - 10 reps - Seated Thoracic Lumbar Extension  - Seated Flexion Stretch with Swiss Ball  - 1 x daily - 1 sets - 20 reps - 5 seconds hold - Baby Cobra Hands Hovering   - 1 x daily - 3 sets - 10 reps - 5 seconds hold - Standing Terminal Knee Extension with Resistance  - 2  x daily - 1 sets - 20 reps - 10 seconds hold   ASSESSMENT:   CLINICAL IMPRESSION: Patient arrives with continued complaint of morning low back pain but appears to be standing taller and continuing to have some pain relief through the day. He needed more rest breaks between exercises today and seemed more tired than last visit. Session continued to focus on functional/postural/LE strengthening and ROM exercises to improve posture, pain, and function. Patient would benefit from continued management of limiting condition by skilled physical therapist to address remaining impairments and functional limitations to work towards stated goals and return to PLOF or maximal functional independence.   OBJECTIVE IMPAIRMENTS Abnormal gait, decreased activity tolerance, decreased endurance, decreased mobility, difficulty  walking, decreased ROM, decreased strength, hypomobility, impaired perceived functional ability, increased muscle spasms, impaired flexibility, improper body mechanics, postural dysfunction, and pain.    ACTIVITY LIMITATIONS carrying, lifting, bending, standing, bed mobility, dressing, and locomotion level   PARTICIPATION LIMITATIONS: meal prep, cleaning, laundry, interpersonal relationship, shopping, community activity, yard work, church, and   asthma, Zenker's (hypopharyngeal) diverticulum, afib, GERD, hx of fatty liver, hypothyroidism, BPH, OA, hx adeomatous polyp of colon, inguinal hernia, L knee pain, hx of dysplastic nevus, colon surgery (7 inches removed of colon), cataract extraction, L knee pain   PERSONAL FACTORS Age, Past/current experiences, Time since onset of injury/illness/exacerbation, and 3+ comorbidities:   asthma, Zenker's (hypopharyngeal) diverticulum, afib, GERD, hx of fatty liver, hypothyroidism, BPH, OA, hx adeomatous polyp of colon, inguinal hernia, L knee pain, hx of dysplastic nevus, colon surgery (7 inches removed of colon), cataract extraction, L knee pain are also affecting patient's functional outcome.    REHAB POTENTIAL: Good   CLINICAL DECISION MAKING: Stable/uncomplicated   EVALUATION COMPLEXITY: Low     GOALS: Goals reviewed with patient? yes   SHORT TERM GOALS: Target date: 01/03/2022   Patient will be independent with initial home exercise program for self-management of symptoms. Baseline: Initial HEP to be provided at visit 2 as appropriate (12/20/21); initial HEP provided visit 2 (12/26/2021);  Goal status: met    LONG TERM GOALS: Target date: 03/14/2022. UPDATED to 06/07/2022 for all unmet goals on 03/15/2022. UPDATED to 09/04/2022 for all unmet goals on 06/12/2022.    Patient will be independent with a long-term home exercise program for self-management of symptoms.  Baseline: Initial HEP to be provided at visit 2 as appropriate (12/20/21); initial HEP  provided visit 2 (12/26/2021); participating in HEP (01/23/2022); currently participating (03/15/2022; 06/12/2022);   Goal status: In-progress   2.  Patient will demonstrate improved FOTO to equal or greater than 67 by visit #11 to demonstrate improvement in overall condition and self-reported functional ability.  Baseline: 54 (12/20/21); 62 at visit # 10 (01/23/2022); 65 at visit #18 (03/15/2022); 63 at visit #29 (06/12/2022);  Goal status: In-progress   3.  Patient will ambulate equal or greater than 1200 feet on 6 Minute Walk Test with no increase in symptoms to demonstrate improved community ambulation.  Baseline: to be tested visit 2 as appropriate (12/20/21); 1,225' up to 2/10 NPS (12/26/2021); 1263 feet with no AD, no complaint of pain while walking. Pain at waist when going to sit after walking. R knee pain (01/23/2022); 1347 feet with no AD, stiffening at about  3 min. Pain at right scapula up to 4-5/10. R knee pain (03/15/2022); 1400 feet with no AD, R flank pain increasing near  3 min and traveling to right upper trap region. Increased familiar pain at low back by end  of walk NPRS 5/10 (06/12/2022);  Goal status: Met 01/23/2022   4.  Patient will demonstrate the ability to lift 25# box floor to waist 1x10 times in a row with no increase in symptoms to improve his ability to bend and lift things around his home.  Baseline: reports difficulty with bending and lifting items at home  (12/20/21); 3 reps at 25#. Reported pain with each lift at low back increasing to 4/10 at 25# with request to stop (01/23/2022); 1x10 reps at 25#. Reported pain with each lift at low back increasing to 5/10 at 25#. Incomplete stand and occasionally missing touching floor (03/15/2022; 06/12/2022);  Goal status: In-progress   5.  Patient will report the ability to stand/walk equal or greater than 60 min before needing to sit to improve his ability to stand in line, socialize, and cook.  Baseline: 5-20 min (12/20/21); on his  feet 60 min with salon pass on his back making hashbrown casserole this morning before he started to get pain, he feels he would not be able to do this without the salon pass (01/23/2022): Has not tested it recently, there is a party coming up where he will be cooking in November (03/15/2022); at least 1 hour before he has to sit down, he can clean 1 bathroom at a time (06/12/2022);  Goal status: Nearly met.      PLAN: PT FREQUENCY: 1-2x/week   PT DURATION: 12 weeks   PLANNED INTERVENTIONS: Therapeutic exercises, Therapeutic activity, Neuromuscular re-education, Patient/Family education, Joint mobilization, DME instructions, Dry Needling, Electrical stimulation, Spinal mobilization, Cryotherapy, Moist heat, Manual therapy, and Re-evaluation.   PLAN FOR NEXT SESSION: update HEP as appropriate, manual therapy and exercises for improved joint mobility in the spine.  Everlean Alstrom. Graylon Good, PT, DPT 07/19/22, 8:42 PM  [facility] [address] [place], [place] [address] P: [phone] I F: [phone]

## 2022-07-24 ENCOUNTER — Encounter: Payer: Self-pay | Admitting: Physical Therapy

## 2022-07-24 ENCOUNTER — Ambulatory Visit: Payer: Medicare Other | Admitting: Physical Therapy

## 2022-07-24 DIAGNOSIS — M546 Pain in thoracic spine: Secondary | ICD-10-CM | POA: Diagnosis not present

## 2022-07-24 DIAGNOSIS — R293 Abnormal posture: Secondary | ICD-10-CM

## 2022-07-24 DIAGNOSIS — M5459 Other low back pain: Secondary | ICD-10-CM

## 2022-07-24 DIAGNOSIS — R262 Difficulty in walking, not elsewhere classified: Secondary | ICD-10-CM

## 2022-07-24 NOTE — Therapy (Signed)
OUTPATIENT PHYSICAL THERAPY TREATMENT NOTE   Patient Name: Daniel [name] MRN: [id number] DOB:04-02-1951, 72 y.o., male Today's Date: 07/24/2022  PCP: Jerrol Banana., MD REFERRING PROVIDER: Allene Dillon, NP  END OF SESSION:   PT End of Session - 07/24/22 2009     Visit Number 36    Number of Visits 41    Date for PT Re-Evaluation 09/04/22    Authorization Type [employer] MEDICARE reporting period from 06/19/2022    Progress Note Due on Visit 40    PT Start Time 1650    PT Stop Time 1730    PT Time Calculation (min) 40 min    Equipment Utilized During Treatment Gait belt    Activity Tolerance Patient tolerated treatment well;Patient limited by fatigue;Patient limited by pain    Behavior During Therapy [facility] for tasks assessed/performed                Past Medical History:  Diagnosis Date   Actinic keratosis    Asthma    Eczema    Hx of dysplastic nevus 06/06/2006   Mid lower back, slight atypia   Hypothyroidism    Seasonal allergies    Thyroid disease    Past Surgical History:  Procedure Laterality Date   CATARACT EXTRACTION     COLON SURGERY     COLONOSCOPY WITH PROPOFOL N/A 11/18/2014   Procedure: COLONOSCOPY WITH PROPOFOL;  Surgeon: [name], MD;  Location: [facility];  Service: Endoscopy;  Laterality: N/A;   COLONOSCOPY WITH PROPOFOL N/A 12/13/2021   Procedure: COLONOSCOPY WITH PROPOFOL;  Surgeon: [name], MD;  Location: [facility] ENDOSCOPY;  Service: Endoscopy;  Laterality: N/A;   EYE SURGERY     KNEE SURGERY     NASAL SEPTUM SURGERY     Patient Active Problem List   Diagnosis Date Noted   Chronic idiopathic constipation 06/05/2022   Left lower quadrant abdominal pain 06/05/2022   Paroxysmal atrial fibrillation ([place]) 03/28/2022   Idiopathic hypotension 03/28/2022   Spondylolysis, lumbar region 03/28/2022   Avitaminosis D 03/28/2022   Myalgia due to statin 03/28/2022   Acquired thrombophilia ([place]) 03/28/2022   Hyperlipidemia  03/28/2022   Abnormal gait 03/28/2022   Mild intermittent asthma without complication [id number]   Zenker's (hypopharyngeal) diverticulum 03/22/2018   Gastroesophageal reflux disease 03/22/2018   Fatty liver 10/09/2017   BPH (benign prostatic hyperplasia) 11/21/2014   Hypothyroidism 11/21/2014   Inguinal hernia 11/21/2014   OA (osteoarthritis) 11/21/2014   H/O adenomatous polyp of colon 10/14/2014    REFERRING DIAG: lumbar DDD (degenerative disc disease), lumbar stenosis with neurogenic claudication, lumbar radiculitis  THERAPY DIAG:  Other low back pain  Pain in thoracic spine  Abnormal posture  Difficulty in walking, not elsewhere classified  Rationale for Evaluation and Treatment: Rehabilitation  PERTINENT HISTORY: Patient is a 72 y.o. male who presents to outpatient physical therapy with a referral for medical diagnosis lumbar DDD (degenerative disc disease, lumbar stenosis with neurogenic claudication, lumbar radiculitis. This patient's chief complaints consist of chronic pain in the low back and upper glutes that moves up towards his B scapulae with prolonged standing/walking and with intermittent shooting down his R left to the knee, leading to the following functional deficits: difficulty with anything that requires a lot of standing/walking, cooking for church/wine club, cleaning the house, waiting in line, standing longer than 5-20 min, having conversations with others while standing, yard work, Orthoptist, cleaning, sit <> stand, bending and lifting together, staining deck, shopping, walking more  than 2 blocks, social participation, changing positions in the bed, golfing.    Relevant past medical history and comorbidities include asthma, Zenker's (hypopharyngeal) diverticulum, afib, GERD, hx of fatty liver, hypothyroidism, BPH, OA, hx adeomatous polyp of colon, inguinal hernia, L knee pain, hx of dysplastic nevus, colon surgery (7 inches removed of colon), cataract  extraction, L knee pain.  Patient denies hx of cancer, stroke, seizures, lung problem, diabetes, unexplained weight loss, unexplained changes in bowel or bladder problems, unexplained stumbling or dropping things, spinal surgery. He has been exercising daily.     PRECAUTIONS: none  SUBJECTIVE: Patient reports his back is bothering him today after cleaning his house on Saturday and Sunday. He states he felt pretty good after last PT session for two days. He states his R knee has been popping a bit more.   PAIN:  Are you having pain? NPRS 4-5/10 across low back OBJECTIVE   TODAY'S TREATMENT  Therapeutic exercise: to centralize symptoms and improve ROM, strength, muscular endurance, and activity tolerance required for successful completion of functional activities.  -  alternating lateral stepping over 8 inch small aerobic step with 5#AW on each foot, B UE support, 3x10 each side.  - forward step to kickstand with back heel lifted with twist each direction while holding 15#DB held with both hands in front of body. CGA, cuing for technique. 3x20 feet.  - modified hang from lat pull bar, 3x30 seconds (reports UE fatigue).   Superset:  - seated thoracic extension over towel roll in chair with clinician OP, 3x10 with up to 5 second hold.  - sit <> stand/squat with overhead press with 6#DB in each hand. From a2z pad in 18 inch chair, 3x10. Cuing for improved UE position and encouragement to complete. Limited most by LE fatigue.   - standing B shoulder flexion with PVC stick loaded with 10#AW while standing on airex pad. 2x10, CGA   Pt required multimodal cuing for proper technique and to facilitate improved neuromuscular control, strength, range of motion, and functional ability resulting in improved performance and form.   PATIENT EDUCATION:  Education details: Exercise purpose/form. Self management techniques.  Reviewed cancelation/no-show policy with patient and confirmed patient has correct  phone number for clinic; patient verbalized understanding (01/18/22). Person educated: Patient Education method: Explanation, demonstration, VC, TC Education comprehension: verbalized understanding, demonstrated understanding, and needs further education     HOME EXERCISE PROGRAM: Access Code: [email or web address] URL: [email or web address] Date: 06/26/2022 Prepared by: Rosita Kea  Exercises - Sidelying Thoracic Rotation with Open Book  - 1-2 x daily - 3 sets - 10 reps - 5 seconds hold - Bridge  - 1-2 x daily - 3 sets - 10 reps - 5 seconds hold - Supine Lower Trunk Rotation  - 1 x daily - 3 sets - 15 reps - Prone Press Up  - 1-2 x daily - 3 sets - 10 reps - Bilateral Bent Leg Lift  - 1-2 x daily - 3 sets - 10 reps - Seated Thoracic Lumbar Extension  - Seated Flexion Stretch with Swiss Ball  - 1 x daily - 1 sets - 20 reps - 5 seconds hold - Baby Cobra Hands Hovering   - 1 x daily - 3 sets - 10 reps - 5 seconds hold - Standing Terminal Knee Extension with Resistance  - 2 x daily - 1 sets - 20 reps - 10 seconds hold   ASSESSMENT:   CLINICAL IMPRESSION: Patient arrives reporting increased pain after cleaning his house. He  was able to continue with strengthening and postural exercises with progressions as appropriate. He did note that his upper and lower extremities did not get as tired as easily with bar hang but he did not sit all the way down during sit<> stand exercise. Patient continues to have limitations due to his right knee pain and popping. He also did not appear as upright today. Plan to continue with postural/LE/functional strengthening as tolerated. Patient would benefit from continued management of limiting condition by skilled physical therapist to address remaining impairments and functional limitations to work towards stated goals and return to PLOF or maximal functional independence.   OBJECTIVE IMPAIRMENTS Abnormal gait, decreased activity tolerance, decreased endurance,  decreased mobility, difficulty walking, decreased ROM, decreased strength, hypomobility, impaired perceived functional ability, increased muscle spasms, impaired flexibility, improper body mechanics, postural dysfunction, and pain.    ACTIVITY LIMITATIONS carrying, lifting, bending, standing, bed mobility, dressing, and locomotion level   PARTICIPATION LIMITATIONS: meal prep, cleaning, laundry, interpersonal relationship, shopping, community activity, yard work, church, and   asthma, Zenker's (hypopharyngeal) diverticulum, afib, GERD, hx of fatty liver, hypothyroidism, BPH, OA, hx adeomatous polyp of colon, inguinal hernia, L knee pain, hx of dysplastic nevus, colon surgery (7 inches removed of colon), cataract extraction, L knee pain   PERSONAL FACTORS Age, Past/current experiences, Time since onset of injury/illness/exacerbation, and 3+ comorbidities:   asthma, Zenker's (hypopharyngeal) diverticulum, afib, GERD, hx of fatty liver, hypothyroidism, BPH, OA, hx adeomatous polyp of colon, inguinal hernia, L knee pain, hx of dysplastic nevus, colon surgery (7 inches removed of colon), cataract extraction, L knee pain are also affecting patient's functional outcome.    REHAB POTENTIAL: Good   CLINICAL DECISION MAKING: Stable/uncomplicated   EVALUATION COMPLEXITY: Low     GOALS: Goals reviewed with patient? yes   SHORT TERM GOALS: Target date: 01/03/2022   Patient will be independent with initial home exercise program for self-management of symptoms. Baseline: Initial HEP to be provided at visit 2 as appropriate (12/20/21); initial HEP provided visit 2 (12/26/2021);  Goal status: met    LONG TERM GOALS: Target date: 03/14/2022. UPDATED to 06/07/2022 for all unmet goals on 03/15/2022. UPDATED to 09/04/2022 for all unmet goals on 06/12/2022.    Patient will be independent with a long-term home exercise program for self-management of symptoms.  Baseline: Initial HEP to be provided at visit 2 as  appropriate (12/20/21); initial HEP provided visit 2 (12/26/2021); participating in HEP (01/23/2022); currently participating (03/15/2022; 06/12/2022);   Goal status: In-progress   2.  Patient will demonstrate improved FOTO to equal or greater than 67 by visit #11 to demonstrate improvement in overall condition and self-reported functional ability.  Baseline: 54 (12/20/21); 62 at visit # 10 (01/23/2022); 65 at visit #18 (03/15/2022); 63 at visit #29 (06/12/2022);  Goal status: In-progress   3.  Patient will ambulate equal or greater than 1200 feet on 6 Minute Walk Test with no increase in symptoms to demonstrate improved community ambulation.  Baseline: to be tested visit 2 as appropriate (12/20/21); 1,225' up to 2/10 NPS (12/26/2021); 1263 feet with no AD, no complaint of pain while walking. Pain at waist when going to sit after walking. R knee pain (01/23/2022); 1347 feet with no AD, stiffening at about  3 min. Pain at right scapula up to 4-5/10. R knee pain (03/15/2022); 1400 feet with no AD, R flank pain increasing near  3 min and traveling to right upper trap region. Increased familiar pain at low back by end of walk  NPRS 5/10 (06/12/2022);  Goal status: Met 01/23/2022   4.  Patient will demonstrate the ability to lift 25# box floor to waist 1x10 times in a row with no increase in symptoms to improve his ability to bend and lift things around his home.  Baseline: reports difficulty with bending and lifting items at home  (12/20/21); 3 reps at 25#. Reported pain with each lift at low back increasing to 4/10 at 25# with request to stop (01/23/2022); 1x10 reps at 25#. Reported pain with each lift at low back increasing to 5/10 at 25#. Incomplete stand and occasionally missing touching floor (03/15/2022; 06/12/2022);  Goal status: In-progress   5.  Patient will report the ability to stand/walk equal or greater than 60 min before needing to sit to improve his ability to stand in line, socialize, and cook.   Baseline: 5-20 min (12/20/21); on his feet 60 min with salon pass on his back making hashbrown casserole this morning before he started to get pain, he feels he would not be able to do this without the salon pass (01/23/2022): Has not tested it recently, there is a party coming up where he will be cooking in November (03/15/2022); at least 1 hour before he has to sit down, he can clean 1 bathroom at a time (06/12/2022);  Goal status: Nearly met.      PLAN: PT FREQUENCY: 1-2x/week   PT DURATION: 12 weeks   PLANNED INTERVENTIONS: Therapeutic exercises, Therapeutic activity, Neuromuscular re-education, Patient/Family education, Joint mobilization, DME instructions, Dry Needling, Electrical stimulation, Spinal mobilization, Cryotherapy, Moist heat, Manual therapy, and Re-evaluation.   PLAN FOR NEXT SESSION: update HEP as appropriate, manual therapy and exercises for improved joint mobility in the spine.  Everlean Alstrom. Graylon Good, PT, DPT 07/24/22, 8:12 PM  [facility] [address] [place], [place] [address] P: [phone] I F: [phone]

## 2022-08-02 ENCOUNTER — Ambulatory Visit: Payer: Medicare Other | Attending: Family Medicine | Admitting: Physical Therapy

## 2022-08-02 ENCOUNTER — Encounter: Payer: Self-pay | Admitting: Physical Therapy

## 2022-08-02 DIAGNOSIS — M546 Pain in thoracic spine: Secondary | ICD-10-CM | POA: Diagnosis not present

## 2022-08-02 DIAGNOSIS — R262 Difficulty in walking, not elsewhere classified: Secondary | ICD-10-CM | POA: Diagnosis not present

## 2022-08-02 DIAGNOSIS — M5459 Other low back pain: Secondary | ICD-10-CM | POA: Insufficient documentation

## 2022-08-02 DIAGNOSIS — R293 Abnormal posture: Secondary | ICD-10-CM | POA: Insufficient documentation

## 2022-08-02 NOTE — Therapy (Signed)
OUTPATIENT PHYSICAL THERAPY TREATMENT NOTE   Patient Name: Daniel [name] MRN: [id number] DOB:Aug 29, 1950, 72 y.o., male Today's Date: 08/02/2022  PCP: Jerrol Banana., MD REFERRING PROVIDER: Allene Dillon, NP  END OF SESSION:   PT End of Session - 08/02/22 1647     Visit Number 37    Number of Visits 41    Date for PT Re-Evaluation 09/04/22    Authorization Type [employer] MEDICARE reporting period from 06/19/2022    Progress Note Due on Visit 40    PT Start Time 1647    PT Stop Time 1725    PT Time Calculation (min) 38 min    Equipment Utilized During Treatment Gait belt    Activity Tolerance Patient tolerated treatment well;Patient limited by fatigue;Patient limited by pain    Behavior During Therapy [facility] for tasks assessed/performed                 Past Medical History:  Diagnosis Date   Actinic keratosis    Asthma    Eczema    Hx of dysplastic nevus 06/06/2006   Mid lower back, slight atypia   Hypothyroidism    Seasonal allergies    Thyroid disease    Past Surgical History:  Procedure Laterality Date   CATARACT EXTRACTION     COLON SURGERY     COLONOSCOPY WITH PROPOFOL N/A 11/18/2014   Procedure: COLONOSCOPY WITH PROPOFOL;  Surgeon: [name], MD;  Location: [place];  Service: Endoscopy;  Laterality: N/A;   COLONOSCOPY WITH PROPOFOL N/A 12/13/2021   Procedure: COLONOSCOPY WITH PROPOFOL;  Surgeon: [name], MD;  Location: [facility] ENDOSCOPY;  Service: Endoscopy;  Laterality: N/A;   EYE SURGERY     KNEE SURGERY     NASAL SEPTUM SURGERY     Patient Active Problem List   Diagnosis Date Noted   Chronic idiopathic constipation 06/05/2022   Left lower quadrant abdominal pain 06/05/2022   Paroxysmal atrial fibrillation ([place]) 03/28/2022   Idiopathic hypotension 03/28/2022   Spondylolysis, lumbar region 03/28/2022   Avitaminosis D 03/28/2022   Myalgia due to statin 03/28/2022   Acquired thrombophilia ([place]) 03/28/2022   Hyperlipidemia  03/28/2022   Abnormal gait 03/28/2022   Mild intermittent asthma without complication [id number]   Zenker's (hypopharyngeal) diverticulum 03/22/2018   Gastroesophageal reflux disease 03/22/2018   Fatty liver 10/09/2017   BPH (benign prostatic hyperplasia) 11/21/2014   Hypothyroidism 11/21/2014   Inguinal hernia 11/21/2014   OA (osteoarthritis) 11/21/2014   H/O adenomatous polyp of colon 10/14/2014    REFERRING DIAG: lumbar DDD (degenerative disc disease), lumbar stenosis with neurogenic claudication, lumbar radiculitis  THERAPY DIAG:  Other low back pain  Pain in thoracic spine  Abnormal posture  Difficulty in walking, not elsewhere classified  Rationale for Evaluation and Treatment: Rehabilitation  PERTINENT HISTORY: Patient is a 72 y.o. male who presents to outpatient physical therapy with a referral for medical diagnosis lumbar DDD (degenerative disc disease, lumbar stenosis with neurogenic claudication, lumbar radiculitis. This patient's chief complaints consist of chronic pain in the low back and upper glutes that moves up towards his B scapulae with prolonged standing/walking and with intermittent shooting down his R left to the knee, leading to the following functional deficits: difficulty with anything that requires a lot of standing/walking, cooking for church/wine club, cleaning the house, waiting in line, standing longer than 5-20 min, having conversations with others while standing, yard work, Orthoptist, cleaning, sit <> stand, bending and lifting together, staining deck, shopping, walking  more than 2 blocks, social participation, changing positions in the bed, golfing.    Relevant past medical history and comorbidities include asthma, Zenker's (hypopharyngeal) diverticulum, afib, GERD, hx of fatty liver, hypothyroidism, BPH, OA, hx adeomatous polyp of colon, inguinal hernia, L knee pain, hx of dysplastic nevus, colon surgery (7 inches removed of colon), cataract  extraction, L knee pain.  Patient denies hx of cancer, stroke, seizures, lung problem, diabetes, unexplained weight loss, unexplained changes in bowel or bladder problems, unexplained stumbling or dropping things, spinal surgery. He has been exercising daily.     PRECAUTIONS: none  SUBJECTIVE: Patient reports his back pain is doing pretty good. After getting up in the morning it hurts pretty bad for about an hour then feels better after it stretches out and he takes his medication. He felt okay after last PT session. He did very well in [place] with combo of tramadol and prednisone (lasted through Monday). He states it almost felt like he did not have a back problem when in [place]. R knee has bothered him some.   PAIN:  Are you having pain? NPRS 0/10 across low back OBJECTIVE   TODAY'S TREATMENT  Therapeutic exercise: to centralize symptoms and improve ROM, strength, muscular endurance, and activity tolerance required for successful completion of functional activities.  -  alternating lateral stepping over 8 inch small aerobic step with 5#AW on each foot, B UE support, 3x10 each side.   Superset:  - seated thoracic extension over towel roll in chair with clinician OP, 3x10 with up to 5 second hold.  - sit <> stand/squat with overhead press with 6#DB in each hand. From a2z pad in 18 inch chair, 3x10. Cuing for improved UE position and encouragement to complete. Limited most by LE fatigue. Reports R knee pain during exercise.   - standing B shoulder flexion with PVC stick loaded with 10#AW while standing on airex pad. 3x10, CGA. (Seated rest after 2nd set).  - forward step to kickstand with back heel lifted with twist each direction while holding 15#DB held with both hands in front of body. CGA, cuing for technique. 3x20 feet.  - seated scapular rows at [facility] machine, 3x10 at 25/35/35#.   Pt required multimodal cuing for proper technique and to facilitate improved neuromuscular  control, strength, range of motion, and functional ability resulting in improved performance and form.   PATIENT EDUCATION:  Education details: Exercise purpose/form. Self management techniques.  Reviewed cancelation/no-show policy with patient and confirmed patient has correct phone number for clinic; patient verbalized understanding (01/18/22). Person educated: Patient Education method: Explanation, demonstration, VC, TC Education comprehension: verbalized understanding, demonstrated understanding, and needs further education     HOME EXERCISE PROGRAM: Access Code: [email or web address] URL: [email or web address] Date: 06/26/2022 Prepared by: Rosita Kea  Exercises - Sidelying Thoracic Rotation with Open Book  - 1-2 x daily - 3 sets - 10 reps - 5 seconds hold - Bridge  - 1-2 x daily - 3 sets - 10 reps - 5 seconds hold - Supine Lower Trunk Rotation  - 1 x daily - 3 sets - 15 reps - Prone Press Up  - 1-2 x daily - 3 sets - 10 reps - Bilateral Bent Leg Lift  - 1-2 x daily - 3 sets - 10 reps - Seated Thoracic Lumbar Extension  - Seated Flexion Stretch with Swiss Ball  - 1 x daily - 1 sets - 20 reps - 5 seconds hold - Baby Cobra Hands Hovering   - 1 x daily -  3 sets - 10 reps - 5 seconds hold - Standing Terminal Knee Extension with Resistance  - 2 x daily - 1 sets - 20 reps - 10 seconds hold   ASSESSMENT:   CLINICAL IMPRESSION: Patient arrives reporting stable back pain with consistent participation in PT and medication. Today's session continued with LE, balance, postural and functional strengthening to improve posture, decrease pain, and improve functional activity tolerance for daily activities. Patient reports he is tired by the end of the session. Patient would benefit from continued management of limiting condition by skilled physical therapist to address remaining impairments and functional limitations to work towards stated goals and return to PLOF or maximal functional  independence.   OBJECTIVE IMPAIRMENTS Abnormal gait, decreased activity tolerance, decreased endurance, decreased mobility, difficulty walking, decreased ROM, decreased strength, hypomobility, impaired perceived functional ability, increased muscle spasms, impaired flexibility, improper body mechanics, postural dysfunction, and pain.    ACTIVITY LIMITATIONS carrying, lifting, bending, standing, bed mobility, dressing, and locomotion level   PARTICIPATION LIMITATIONS: meal prep, cleaning, laundry, interpersonal relationship, shopping, community activity, yard work, church, and   asthma, Zenker's (hypopharyngeal) diverticulum, afib, GERD, hx of fatty liver, hypothyroidism, BPH, OA, hx adeomatous polyp of colon, inguinal hernia, L knee pain, hx of dysplastic nevus, colon surgery (7 inches removed of colon), cataract extraction, L knee pain   PERSONAL FACTORS Age, Past/current experiences, Time since onset of injury/illness/exacerbation, and 3+ comorbidities:   asthma, Zenker's (hypopharyngeal) diverticulum, afib, GERD, hx of fatty liver, hypothyroidism, BPH, OA, hx adeomatous polyp of colon, inguinal hernia, L knee pain, hx of dysplastic nevus, colon surgery (7 inches removed of colon), cataract extraction, L knee pain are also affecting patient's functional outcome.    REHAB POTENTIAL: Good   CLINICAL DECISION MAKING: Stable/uncomplicated   EVALUATION COMPLEXITY: Low     GOALS: Goals reviewed with patient? yes   SHORT TERM GOALS: Target date: 01/03/2022   Patient will be independent with initial home exercise program for self-management of symptoms. Baseline: Initial HEP to be provided at visit 2 as appropriate (12/20/21); initial HEP provided visit 2 (12/26/2021);  Goal status: met    LONG TERM GOALS: Target date: 03/14/2022. UPDATED to 06/07/2022 for all unmet goals on 03/15/2022. UPDATED to 09/04/2022 for all unmet goals on 06/12/2022.    Patient will be independent with a long-term home  exercise program for self-management of symptoms.  Baseline: Initial HEP to be provided at visit 2 as appropriate (12/20/21); initial HEP provided visit 2 (12/26/2021); participating in HEP (01/23/2022); currently participating (03/15/2022; 06/12/2022);   Goal status: In-progress   2.  Patient will demonstrate improved FOTO to equal or greater than 67 by visit #11 to demonstrate improvement in overall condition and self-reported functional ability.  Baseline: 54 (12/20/21); 62 at visit # 10 (01/23/2022); 65 at visit #18 (03/15/2022); 63 at visit #29 (06/12/2022);  Goal status: In-progress   3.  Patient will ambulate equal or greater than 1200 feet on 6 Minute Walk Test with no increase in symptoms to demonstrate improved community ambulation.  Baseline: to be tested visit 2 as appropriate (12/20/21); 1,225' up to 2/10 NPS (12/26/2021); 1263 feet with no AD, no complaint of pain while walking. Pain at waist when going to sit after walking. R knee pain (01/23/2022); 1347 feet with no AD, stiffening at about  3 min. Pain at right scapula up to 4-5/10. R knee pain (03/15/2022); 1400 feet with no AD, R flank pain increasing near  3 min and traveling to right upper trap  region. Increased familiar pain at low back by end of walk NPRS 5/10 (06/12/2022);  Goal status: Met 01/23/2022   4.  Patient will demonstrate the ability to lift 25# box floor to waist 1x10 times in a row with no increase in symptoms to improve his ability to bend and lift things around his home.  Baseline: reports difficulty with bending and lifting items at home  (12/20/21); 3 reps at 25#. Reported pain with each lift at low back increasing to 4/10 at 25# with request to stop (01/23/2022); 1x10 reps at 25#. Reported pain with each lift at low back increasing to 5/10 at 25#. Incomplete stand and occasionally missing touching floor (03/15/2022; 06/12/2022);  Goal status: In-progress   5.  Patient will report the ability to stand/walk equal or greater  than 60 min before needing to sit to improve his ability to stand in line, socialize, and cook.  Baseline: 5-20 min (12/20/21); on his feet 60 min with salon pass on his back making hashbrown casserole this morning before he started to get pain, he feels he would not be able to do this without the salon pass (01/23/2022): Has not tested it recently, there is a party coming up where he will be cooking in November (03/15/2022); at least 1 hour before he has to sit down, he can clean 1 bathroom at a time (06/12/2022);  Goal status: Nearly met.      PLAN: PT FREQUENCY: 1-2x/week   PT DURATION: 12 weeks   PLANNED INTERVENTIONS: Therapeutic exercises, Therapeutic activity, Neuromuscular re-education, Patient/Family education, Joint mobilization, DME instructions, Dry Needling, Electrical stimulation, Spinal mobilization, Cryotherapy, Moist heat, Manual therapy, and Re-evaluation.   PLAN FOR NEXT SESSION: update HEP as appropriate, manual therapy and exercises for improved joint mobility in the spine.  Everlean Alstrom. Graylon Good, PT, DPT 08/02/22, 5:24 PM  [facility] [address] [place], [place] [address] P: [phone] I F: [phone]

## 2022-08-08 ENCOUNTER — Telehealth: Payer: Self-pay | Admitting: Family Medicine

## 2022-08-08 MED ORDER — LEVOTHYROXINE SODIUM 100 MCG PO TABS: ORAL_TABLET | ORAL | 1 refills | Status: DC

## 2022-08-08 NOTE — Telephone Encounter (Signed)
[facility] pharmacy faxed refill request for the following medications:    levothyroxine (SYNTHROID) 100 MCG tablet   Please advise  

## 2022-08-09 ENCOUNTER — Ambulatory Visit: Payer: Medicare Other | Admitting: Physical Therapy

## 2022-08-16 ENCOUNTER — Ambulatory Visit: Payer: Medicare Other | Admitting: Physical Therapy

## 2022-08-16 ENCOUNTER — Encounter: Payer: Self-pay | Admitting: Physical Therapy

## 2022-08-16 DIAGNOSIS — M5459 Other low back pain: Secondary | ICD-10-CM

## 2022-08-16 DIAGNOSIS — R293 Abnormal posture: Secondary | ICD-10-CM

## 2022-08-16 DIAGNOSIS — R262 Difficulty in walking, not elsewhere classified: Secondary | ICD-10-CM | POA: Diagnosis not present

## 2022-08-16 DIAGNOSIS — M546 Pain in thoracic spine: Secondary | ICD-10-CM

## 2022-08-16 NOTE — Therapy (Signed)
OUTPATIENT PHYSICAL THERAPY TREATMENT NOTE   Patient Name: Daniel [name] MRN: [id number] DOB:[birth year], 72 y.o., male Today's Date: 08/16/2022  PCP: Jerrol Banana., MD REFERRING PROVIDER: Allene Dillon, NP  END OF SESSION:   PT End of Session - 08/16/22 1725     Visit Number 38    Number of Visits 41    Date for PT Re-Evaluation 09/04/22    Authorization Type [employer] MEDICARE reporting period from 06/19/2022    Progress Note Due on Visit 40    PT Start Time 1645    PT Stop Time 1725    PT Time Calculation (min) 40 min    Equipment Utilized During Treatment Gait belt    Activity Tolerance Patient tolerated treatment well;Patient limited by fatigue;Patient limited by pain    Behavior During Therapy [facility] for tasks assessed/performed                  Past Medical History:  Diagnosis Date   Actinic keratosis    Asthma    Eczema    Hx of dysplastic nevus 06/06/2006   Mid lower back, slight atypia   Hypothyroidism    Seasonal allergies    Thyroid disease    Past Surgical History:  Procedure Laterality Date   CATARACT EXTRACTION     COLON SURGERY     COLONOSCOPY WITH PROPOFOL N/A 11/18/2014   Procedure: COLONOSCOPY WITH PROPOFOL;  Surgeon: [name], MD;  Location: [facility] ENDOSCOPY;  Service: Endoscopy;  Laterality: N/A;   COLONOSCOPY WITH PROPOFOL N/A 12/13/2021   Procedure: COLONOSCOPY WITH PROPOFOL;  Surgeon: [name], MD;  Location: [facility] ENDOSCOPY;  Service: Endoscopy;  Laterality: N/A;   EYE SURGERY     KNEE SURGERY     NASAL SEPTUM SURGERY     Patient Active Problem List   Diagnosis Date Noted   Chronic idiopathic constipation 06/05/2022   Left lower quadrant abdominal pain 06/05/2022   Paroxysmal atrial fibrillation ([place]) 03/28/2022   Idiopathic hypotension 03/28/2022   Spondylolysis, lumbar region 03/28/2022   Avitaminosis D 03/28/2022   Myalgia due to statin 03/28/2022   Acquired thrombophilia ([place]) 03/28/2022   Hyperlipidemia  03/28/2022   Abnormal gait 03/28/2022   Mild intermittent asthma without complication [id number]   Zenker's (hypopharyngeal) diverticulum 03/22/2018   Gastroesophageal reflux disease 03/22/2018   Fatty liver 10/09/2017   BPH (benign prostatic hyperplasia) 11/21/2014   Hypothyroidism 11/21/2014   Inguinal hernia 11/21/2014   OA (osteoarthritis) 11/21/2014   H/O adenomatous polyp of colon 10/14/2014    REFERRING DIAG: lumbar DDD (degenerative disc disease), lumbar stenosis with neurogenic claudication, lumbar radiculitis  THERAPY DIAG:  Other low back pain  Pain in thoracic spine  Abnormal posture  Difficulty in walking, not elsewhere classified  Rationale for Evaluation and Treatment: Rehabilitation  PERTINENT HISTORY: Patient is a 72 y.o. male who presents to outpatient physical therapy with a referral for medical diagnosis lumbar DDD (degenerative disc disease, lumbar stenosis with neurogenic claudication, lumbar radiculitis. This patient's chief complaints consist of chronic pain in the low back and upper glutes that moves up towards his B scapulae with prolonged standing/walking and with intermittent shooting down his R left to the knee, leading to the following functional deficits: difficulty with anything that requires a lot of standing/walking, cooking for church/wine club, cleaning the house, waiting in line, standing longer than 5-20 min, having conversations with others while standing, yard work, Orthoptist, cleaning, sit <> stand, bending and lifting together, staining deck, shopping,  walking more than 2 blocks, social participation, changing positions in the bed, golfing.    Relevant past medical history and comorbidities include asthma, Zenker's (hypopharyngeal) diverticulum, afib, GERD, hx of fatty liver, hypothyroidism, BPH, OA, hx adeomatous polyp of colon, inguinal hernia, L knee pain, hx of dysplastic nevus, colon surgery (7 inches removed of colon), cataract  extraction, L knee pain.  Patient denies hx of cancer, stroke, seizures, lung problem, diabetes, unexplained weight loss, unexplained changes in bowel or bladder problems, unexplained stumbling or dropping things, spinal surgery. He has been exercising daily.     PRECAUTIONS: none  SUBJECTIVE: Patient reports his back did not do to well this weekend but it has been recovering the last few days. He states he got up on Saturday and it was hurting more for no apparent reason. He did not do anything he can remember that was significant on Friday to cause it. He continues to have a lot of pain in the mornings as usual.   PAIN:  Are you having pain? NPRS 1-2/10 across low back OBJECTIVE   TODAY'S TREATMENT  Therapeutic exercise: to centralize symptoms and improve ROM, strength, muscular endurance, and activity tolerance required for successful completion of functional activities.  -  alternating lateral stepping over 8 inch small aerobic step with 5#AW on each foot, B UE support, 3x10 each side.   Superset:  - seated thoracic extension over towel roll in chair with clinician OP, 3x10 with up to 5 second hold.  - sit <> stand/squat with overhead press with 6#DB in each hand. From a2z pad in 18 inch chair, 3x10. Cuing for improved UE position and encouragement to complete. Limited most by LE fatigue. Reports R knee pain during exercise.   - standing B shoulder flexion with PVC stick loaded with 10/05/08#AW while standing on airex pad. 3x10, CGA. - forward step to kickstand with back heel lifted with twist each direction while holding 15#DB held with both hands in front of body. CGA, cuing for technique. 3x20 feet.  - standing lumbothoracic and cervical extension over TM bar, 2x10.  - seated scapular rows at [facility] machine, 3x10 at 35#.  - seated lat pull at [facility] machine, 3x10 at 35#  Pt required multimodal cuing for proper technique and to facilitate improved neuromuscular control, strength, range of  motion, and functional ability resulting in improved performance and form.   PATIENT EDUCATION:  Education details: Exercise purpose/form. Self management techniques.  Reviewed cancelation/no-show policy with patient and confirmed patient has correct phone number for clinic; patient verbalized understanding (01/18/22). Person educated: Patient Education method: Explanation, demonstration, VC, TC Education comprehension: verbalized understanding, demonstrated understanding, and needs further education     HOME EXERCISE PROGRAM: Access Code: [email or web address] URL: [email or web address] Date: 06/26/2022 Prepared by: Rosita Kea  Exercises - Sidelying Thoracic Rotation with Open Book  - 1-2 x daily - 3 sets - 10 reps - 5 seconds hold - Bridge  - 1-2 x daily - 3 sets - 10 reps - 5 seconds hold - Supine Lower Trunk Rotation  - 1 x daily - 3 sets - 15 reps - Prone Press Up  - 1-2 x daily - 3 sets - 10 reps - Bilateral Bent Leg Lift  - 1-2 x daily - 3 sets - 10 reps - Seated Thoracic Lumbar Extension  - Seated Flexion Stretch with Swiss Ball  - 1 x daily - 1 sets - 20 reps - 5 seconds hold - Baby Cobra Hands Hovering   - 1  x daily - 3 sets - 10 reps - 5 seconds hold - Standing Terminal Knee Extension with Resistance  - 2 x daily - 1 sets - 20 reps - 10 seconds hold   ASSESSMENT:   CLINICAL IMPRESSION: Patient arrives reporting increased back pain since last PT session for no apparent reason. He was able to continue with today's strengthening and balance activities to improve functional and postural strength. He continues to struggle with stooped posture and incomplete ROM despite cuing and effort to improve ROM and posture. Patient would benefit from continued management of limiting condition by skilled physical therapist to address remaining impairments and functional limitations to work towards stated goals and return to PLOF or maximal functional independence.   OBJECTIVE IMPAIRMENTS  Abnormal gait, decreased activity tolerance, decreased endurance, decreased mobility, difficulty walking, decreased ROM, decreased strength, hypomobility, impaired perceived functional ability, increased muscle spasms, impaired flexibility, improper body mechanics, postural dysfunction, and pain.    ACTIVITY LIMITATIONS carrying, lifting, bending, standing, bed mobility, dressing, and locomotion level   PARTICIPATION LIMITATIONS: meal prep, cleaning, laundry, interpersonal relationship, shopping, community activity, yard work, church, and   asthma, Zenker's (hypopharyngeal) diverticulum, afib, GERD, hx of fatty liver, hypothyroidism, BPH, OA, hx adeomatous polyp of colon, inguinal hernia, L knee pain, hx of dysplastic nevus, colon surgery (7 inches removed of colon), cataract extraction, L knee pain   PERSONAL FACTORS Age, Past/current experiences, Time since onset of injury/illness/exacerbation, and 3+ comorbidities:   asthma, Zenker's (hypopharyngeal) diverticulum, afib, GERD, hx of fatty liver, hypothyroidism, BPH, OA, hx adeomatous polyp of colon, inguinal hernia, L knee pain, hx of dysplastic nevus, colon surgery (7 inches removed of colon), cataract extraction, L knee pain are also affecting patient's functional outcome.    REHAB POTENTIAL: Good   CLINICAL DECISION MAKING: Stable/uncomplicated   EVALUATION COMPLEXITY: Low     GOALS: Goals reviewed with patient? yes   SHORT TERM GOALS: Target date: 01/03/2022   Patient will be independent with initial home exercise program for self-management of symptoms. Baseline: Initial HEP to be provided at visit 2 as appropriate (12/20/21); initial HEP provided visit 2 (12/26/2021);  Goal status: met    LONG TERM GOALS: Target date: 03/14/2022. UPDATED to 06/07/2022 for all unmet goals on 03/15/2022. UPDATED to 09/04/2022 for all unmet goals on 06/12/2022.    Patient will be independent with a long-term home exercise program for self-management of  symptoms.  Baseline: Initial HEP to be provided at visit 2 as appropriate (12/20/21); initial HEP provided visit 2 (12/26/2021); participating in HEP (01/23/2022); currently participating (03/15/2022; 06/12/2022);   Goal status: In-progress   2.  Patient will demonstrate improved FOTO to equal or greater than 67 by visit #11 to demonstrate improvement in overall condition and self-reported functional ability.  Baseline: 54 (12/20/21); 62 at visit # 10 (01/23/2022); 65 at visit #18 (03/15/2022); 63 at visit #29 (06/12/2022);  Goal status: In-progress   3.  Patient will ambulate equal or greater than 1200 feet on 6 Minute Walk Test with no increase in symptoms to demonstrate improved community ambulation.  Baseline: to be tested visit 2 as appropriate (12/20/21); 1,225' up to 2/10 NPS (12/26/2021); 1263 feet with no AD, no complaint of pain while walking. Pain at waist when going to sit after walking. R knee pain (01/23/2022); 1347 feet with no AD, stiffening at about  3 min. Pain at right scapula up to 4-5/10. R knee pain (03/15/2022); 1400 feet with no AD, R flank pain increasing near  3 min  and traveling to right upper trap region. Increased familiar pain at low back by end of walk NPRS 5/10 (06/12/2022);  Goal status: Met 01/23/2022   4.  Patient will demonstrate the ability to lift 25# box floor to waist 1x10 times in a row with no increase in symptoms to improve his ability to bend and lift things around his home.  Baseline: reports difficulty with bending and lifting items at home  (12/20/21); 3 reps at 25#. Reported pain with each lift at low back increasing to 4/10 at 25# with request to stop (01/23/2022); 1x10 reps at 25#. Reported pain with each lift at low back increasing to 5/10 at 25#. Incomplete stand and occasionally missing touching floor (03/15/2022; 06/12/2022);  Goal status: In-progress   5.  Patient will report the ability to stand/walk equal or greater than 60 min before needing to sit to  improve his ability to stand in line, socialize, and cook.  Baseline: 5-20 min (12/20/21); on his feet 60 min with salon pass on his back making hashbrown casserole this morning before he started to get pain, he feels he would not be able to do this without the salon pass (01/23/2022): Has not tested it recently, there is a party coming up where he will be cooking in November (03/15/2022); at least 1 hour before he has to sit down, he can clean 1 bathroom at a time (06/12/2022);  Goal status: Nearly met.      PLAN: PT FREQUENCY: 1-2x/week   PT DURATION: 12 weeks   PLANNED INTERVENTIONS: Therapeutic exercises, Therapeutic activity, Neuromuscular re-education, Patient/Family education, Joint mobilization, DME instructions, Dry Needling, Electrical stimulation, Spinal mobilization, Cryotherapy, Moist heat, Manual therapy, and Re-evaluation.   PLAN FOR NEXT SESSION: update HEP as appropriate, manual therapy and exercises for improved joint mobility in the spine.  Everlean Alstrom. Graylon Good, PT, DPT 08/16/22, 5:26 PM  [facility] [address] [place], [place] [address] P: [phone] I F: [phone]

## 2022-08-17 DIAGNOSIS — M545 Low back pain, unspecified: Secondary | ICD-10-CM | POA: Diagnosis not present

## 2022-08-17 DIAGNOSIS — I480 Paroxysmal atrial fibrillation: Secondary | ICD-10-CM | POA: Diagnosis not present

## 2022-08-17 DIAGNOSIS — G20A1 Parkinson's disease without dyskinesia, without mention of fluctuations: Secondary | ICD-10-CM | POA: Diagnosis not present

## 2022-08-17 DIAGNOSIS — G8929 Other chronic pain: Secondary | ICD-10-CM | POA: Diagnosis not present

## 2022-08-23 ENCOUNTER — Ambulatory Visit: Payer: Medicare Other | Admitting: Physical Therapy

## 2022-08-30 ENCOUNTER — Ambulatory Visit: Payer: Medicare Other | Attending: Family Medicine | Admitting: Physical Therapy

## 2022-08-30 ENCOUNTER — Encounter: Payer: Self-pay | Admitting: Physical Therapy

## 2022-08-30 DIAGNOSIS — R262 Difficulty in walking, not elsewhere classified: Secondary | ICD-10-CM

## 2022-08-30 DIAGNOSIS — M5459 Other low back pain: Secondary | ICD-10-CM | POA: Insufficient documentation

## 2022-08-30 DIAGNOSIS — G8929 Other chronic pain: Secondary | ICD-10-CM | POA: Insufficient documentation

## 2022-08-30 DIAGNOSIS — R293 Abnormal posture: Secondary | ICD-10-CM

## 2022-08-30 DIAGNOSIS — M546 Pain in thoracic spine: Secondary | ICD-10-CM

## 2022-08-30 DIAGNOSIS — M545 Low back pain, unspecified: Secondary | ICD-10-CM | POA: Insufficient documentation

## 2022-08-30 NOTE — Therapy (Signed)
OUTPATIENT PHYSICAL THERAPY TREATMENT NOTE   Patient Name: Daniel [name] MRN: [id number] DOB:1950-09-29, 72 y.o., male Today's Date: 08/30/2022  PCP: Jerrol Banana., MD REFERRING PROVIDER: Allene Dillon, NP  END OF SESSION:   PT End of Session - 08/30/22 1646     Visit Number 39    Number of Visits 41    Date for PT Re-Evaluation 09/04/22    Authorization Type [employer] MEDICARE reporting period from 06/19/2022    Progress Note Due on Visit 40    PT Start Time 1646    PT Stop Time 1725    PT Time Calculation (min) 39 min    Equipment Utilized During Treatment Gait belt    Activity Tolerance Patient tolerated treatment well;Patient limited by fatigue;Patient limited by pain    Behavior During Therapy [facility] for tasks assessed/performed                   Past Medical History:  Diagnosis Date   Actinic keratosis    Asthma    Eczema    Hx of dysplastic nevus 06/06/2006   Mid lower back, slight atypia   Hypothyroidism    Seasonal allergies    Thyroid disease    Past Surgical History:  Procedure Laterality Date   CATARACT EXTRACTION     COLON SURGERY     COLONOSCOPY WITH PROPOFOL N/A 11/18/2014   Procedure: COLONOSCOPY WITH PROPOFOL;  Surgeon: [name], MD;  Location: [place];  Service: Endoscopy;  Laterality: N/A;   COLONOSCOPY WITH PROPOFOL N/A 12/13/2021   Procedure: COLONOSCOPY WITH PROPOFOL;  Surgeon: [name], MD;  Location: [facility] END[facility];  Service: Endoscopy;  Laterality: N/A;   EYE SURGERY     KNEE SURGERY     NASAL SEPTUM SURGERY     Patient Active Problem List   Diagnosis Date Noted   Chronic idiopathic constipation 06/05/2022   Left lower quadrant abdominal pain 06/05/2022   Paroxysmal atrial fibrillation 03/28/2022   Idiopathic hypotension 03/28/2022   Spondylolysis, lumbar region 03/28/2022   Avitaminosis D 03/28/2022   Myalgia due to statin 03/28/2022   Acquired thrombophilia 03/28/2022   Hyperlipidemia 03/28/2022    Abnormal gait 03/28/2022   Mild intermittent asthma without complication [id number]   Zenker's (hypopharyngeal) diverticulum 03/22/2018   Gastroesophageal reflux disease 03/22/2018   Fatty liver 10/09/2017   BPH (benign prostatic hyperplasia) 11/21/2014   Hypothyroidism 11/21/2014   Inguinal hernia 11/21/2014   OA (osteoarthritis) 11/21/2014   H/O adenomatous polyp of colon 10/14/2014    REFERRING DIAG: lumbar DDD (degenerative disc disease), lumbar stenosis with neurogenic claudication, lumbar radiculitis  THERAPY DIAG:  Other low back pain  Pain in thoracic spine  Abnormal posture  Difficulty in walking, not elsewhere classified  Rationale for Evaluation and Treatment: Rehabilitation  PERTINENT HISTORY: Patient is a 72 y.o. male who presents to outpatient physical therapy with a referral for medical diagnosis lumbar DDD (degenerative disc disease, lumbar stenosis with neurogenic claudication, lumbar radiculitis. This patient's chief complaints consist of chronic pain in the low back and upper glutes that moves up towards his B scapulae with prolonged standing/walking and with intermittent shooting down his R left to the knee, leading to the following functional deficits: difficulty with anything that requires a lot of standing/walking, cooking for church/wine club, cleaning the house, waiting in line, standing longer than 5-20 min, having conversations with others while standing, yard work, Orthoptist, cleaning, sit <> stand, bending and lifting together, staining deck, shopping, walking  more than 2 blocks, social participation, changing positions in the bed, golfing.    Relevant past medical history and comorbidities include asthma, Zenker's (hypopharyngeal) diverticulum, afib, GERD, hx of fatty liver, hypothyroidism, BPH, OA, hx adeomatous polyp of colon, inguinal hernia, L knee pain, hx of dysplastic nevus, colon surgery (7 inches removed of colon), cataract extraction,  L knee pain.  Patient denies hx of cancer, stroke, seizures, lung problem, diabetes, unexplained weight loss, unexplained changes in bowel or bladder problems, unexplained stumbling or dropping things, spinal surgery. He has been exercising daily.     PRECAUTIONS: Parkinson's  SUBJECTIVE: Patient reports he is doing well today. His back is doing well. Sunday he on his feet from 7:30am to 1-2pm and it did not hurt much. He has been doing some walking and prefers it over the idea of a recumbent bike. He used to have a stationary bike and he hated it. He thinks his neurologist will want him to come to PT for Parkinson's once he is done with PT for the low back. He has been doing more in the yard than he has been able to for a while. He must use his back belt when out in the yard, and his back is eventually limiting.   PAIN:  Are you having pain? NPRS 0/10 across low back  OBJECTIVE  SELF-REPORTED FUNCTION FOTO score: 67/100 (lumbar spine questionnaire)   TODAY'S TREATMENT  Therapeutic exercise: to centralize symptoms and improve ROM, strength, muscular endurance, and activity tolerance required for successful completion of functional activities.  -  alternating lateral stepping over 8 inch small aerobic step with 5#AW on each foot, B UE support, 3x10 each side.   Superset:  - seated thoracic extension over towel roll in chair with clinician OP, 3x10. - sit <> stand/squat with overhead press with 6#DB in each hand. From 18 inch chair, 3x10. Cuing for improved UE position and encouragement to complete. Limited most by LE fatigue and incomplete over head movement. .   - standing B shoulder flexion with PVC stick loaded with 10#AW while standing on airex pad. 3x10, CGA. - forward step to kickstand with back heel lifted with twist each direction while holding 15#DB held with both hands in front of body. Mod I to supervision, cuing for technique. 3x20 feet.  - standing lumbothoracic and cervical  extension over TM bar, 2x10.  - seated scapular rows at [facility] machine, 3x10 at 35#.  - seated lat pull at [facility] machine, 3x10 at 35#  Pt required multimodal cuing for proper technique and to facilitate improved neuromuscular control, strength, range of motion, and functional ability resulting in improved performance and form.   PATIENT EDUCATION:  Education details: Exercise purpose/form. Self management techniques.  Reviewed cancelation/no-show policy with patient and confirmed patient has correct phone number for clinic; patient verbalized understanding (01/18/22). Person educated: Patient Education method: Explanation, demonstration, VC, TC Education comprehension: verbalized understanding, demonstrated understanding, and needs further education     HOME EXERCISE PROGRAM: Access Code: [email or web address] URL: [email or web address] Date: 06/26/2022 Prepared by: Rosita Kea  Exercises - Sidelying Thoracic Rotation with Open Book  - 1-2 x daily - 3 sets - 10 reps - 5 seconds hold - Bridge  - 1-2 x daily - 3 sets - 10 reps - 5 seconds hold - Supine Lower Trunk Rotation  - 1 x daily - 3 sets - 15 reps - Prone Press Up  - 1-2 x daily - 3 sets - 10 reps - Bilateral Bent Leg Lift  -  1-2 x daily - 3 sets - 10 reps - Seated Thoracic Lumbar Extension  - Seated Flexion Stretch with Swiss Ball  - 1 x daily - 1 sets - 20 reps - 5 seconds hold - Baby Cobra Hands Hovering   - 1 x daily - 3 sets - 10 reps - 5 seconds hold - Standing Terminal Knee Extension with Resistance  - 2 x daily - 1 sets - 20 reps - 10 seconds hold   ASSESSMENT:   CLINICAL IMPRESSION: Patient arrives reporting improved pain and function since last PT session. FOTO score demonstrates mild improvement. Patient appears to be nearing readiness for discharge from PT for low back pain, but he would benefit from PT for Parkinson's diagnosis that has been recently confirmed. Plan to assess for discharge from PT for low back at  next session and seek PT referral as agreed to by MD for Parkinson's diagnosis. Patient would benefit from continued management of limiting condition by skilled physical therapist to address remaining impairments and functional limitations to work towards stated goals and return to PLOF or maximal functional independence.   OBJECTIVE IMPAIRMENTS Abnormal gait, decreased activity tolerance, decreased endurance, decreased mobility, difficulty walking, decreased ROM, decreased strength, hypomobility, impaired perceived functional ability, increased muscle spasms, impaired flexibility, improper body mechanics, postural dysfunction, and pain.    ACTIVITY LIMITATIONS carrying, lifting, bending, standing, bed mobility, dressing, and locomotion level   PARTICIPATION LIMITATIONS: meal prep, cleaning, laundry, interpersonal relationship, shopping, community activity, yard work, church, and   asthma, Zenker's (hypopharyngeal) diverticulum, afib, GERD, hx of fatty liver, hypothyroidism, BPH, OA, hx adeomatous polyp of colon, inguinal hernia, L knee pain, hx of dysplastic nevus, colon surgery (7 inches removed of colon), cataract extraction, L knee pain   PERSONAL FACTORS Age, Past/current experiences, Time since onset of injury/illness/exacerbation, and 3+ comorbidities:   asthma, Zenker's (hypopharyngeal) diverticulum, afib, GERD, hx of fatty liver, hypothyroidism, BPH, OA, hx adeomatous polyp of colon, inguinal hernia, L knee pain, hx of dysplastic nevus, colon surgery (7 inches removed of colon), cataract extraction, L knee pain are also affecting patient's functional outcome.    REHAB POTENTIAL: Good   CLINICAL DECISION MAKING: Stable/uncomplicated   EVALUATION COMPLEXITY: Low     GOALS: Goals reviewed with patient? yes   SHORT TERM GOALS: Target date: 01/03/2022   Patient will be independent with initial home exercise program for self-management of symptoms. Baseline: Initial HEP to be provided at  visit 2 as appropriate (12/20/21); initial HEP provided visit 2 (12/26/2021);  Goal status: met    LONG TERM GOALS: Target date: 03/14/2022. UPDATED to 06/07/2022 for all unmet goals on 03/15/2022. UPDATED to 09/04/2022 for all unmet goals on 06/12/2022.    Patient will be independent with a long-term home exercise program for self-management of symptoms.  Baseline: Initial HEP to be provided at visit 2 as appropriate (12/20/21); initial HEP provided visit 2 (12/26/2021); participating in HEP (01/23/2022); currently participating (03/15/2022; 06/12/2022);   Goal status: In-progress   2.  Patient will demonstrate improved FOTO to equal or greater than 67 by visit #11 to demonstrate improvement in overall condition and self-reported functional ability.  Baseline: 54 (12/20/21); 62 at visit # 10 (01/23/2022); 65 at visit #18 (03/15/2022); 63 at visit #29 (06/12/2022);  Goal status: In-progress   3.  Patient will ambulate equal or greater than 1200 feet on 6 Minute Walk Test with no increase in symptoms to demonstrate improved community ambulation.  Baseline: to be tested visit 2 as appropriate (12/20/21);  1,225' up to 2/10 NPS (12/26/2021); 1263 feet with no AD, no complaint of pain while walking. Pain at waist when going to sit after walking. R knee pain (01/23/2022); 1347 feet with no AD, stiffening at about  3 min. Pain at right scapula up to 4-5/10. R knee pain (03/15/2022); 1400 feet with no AD, R flank pain increasing near  3 min and traveling to right upper trap region. Increased familiar pain at low back by end of walk NPRS 5/10 (06/12/2022);  Goal status: Met 01/23/2022   4.  Patient will demonstrate the ability to lift 25# box floor to waist 1x10 times in a row with no increase in symptoms to improve his ability to bend and lift things around his home.  Baseline: reports difficulty with bending and lifting items at home  (12/20/21); 3 reps at 25#. Reported pain with each lift at low back increasing to  4/10 at 25# with request to stop (01/23/2022); 1x10 reps at 25#. Reported pain with each lift at low back increasing to 5/10 at 25#. Incomplete stand and occasionally missing touching floor (03/15/2022; 06/12/2022);  Goal status: In-progress   5.  Patient will report the ability to stand/walk equal or greater than 60 min before needing to sit to improve his ability to stand in line, socialize, and cook.  Baseline: 5-20 min (12/20/21); on his feet 60 min with salon pass on his back making hashbrown casserole this morning before he started to get pain, he feels he would not be able to do this without the salon pass (01/23/2022): Has not tested it recently, there is a party coming up where he will be cooking in November (03/15/2022); at least 1 hour before he has to sit down, he can clean 1 bathroom at a time (06/12/2022);  Goal status: Nearly met.      PLAN: PT FREQUENCY: 1-2x/week   PT DURATION: 12 weeks   PLANNED INTERVENTIONS: Therapeutic exercises, Therapeutic activity, Neuromuscular re-education, Patient/Family education, Joint mobilization, DME instructions, Dry Needling, Electrical stimulation, Spinal mobilization, Cryotherapy, Moist heat, Manual therapy, and Re-evaluation.   PLAN FOR NEXT SESSION: update HEP as appropriate, manual therapy and exercises for improved joint mobility in the spine.  Everlean Alstrom. Graylon Good, PT, DPT 08/30/22, 5:27 PM  [facility] [address] [place], [place] [address] P: [phone] I F: [phone]

## 2022-09-06 ENCOUNTER — Ambulatory Visit: Payer: Medicare Other | Admitting: Physical Therapy

## 2022-09-06 NOTE — Therapy (Deleted)
OUTPATIENT PHYSICAL THERAPY EVALUATION   Patient Name: Daniel [name] MRN: [id number] DOB:November 10, 1950, 72 y.o., male Today's Date: 09/06/2022   PCP:  Erasmo Downer, MD  REFERRING PROVIDER: Cristopher Peru, MD (Neurology)  END OF SESSION:   Past Medical History:  Diagnosis Date   Actinic keratosis    Asthma    Eczema    Hx of dysplastic nevus 06/06/2006   Mid lower back, slight atypia   Hypothyroidism    Seasonal allergies    Thyroid disease    Past Surgical History:  Procedure Laterality Date   CATARACT EXTRACTION     COLON SURGERY     COLONOSCOPY WITH PROPOFOL N/A 11/18/2014   Procedure: COLONOSCOPY WITH PROPOFOL;  Surgeon: Scot Jun, MD;  Location: [facility] ENDOSCOPY;  Service: Endoscopy;  Laterality: N/A;   COLONOSCOPY WITH PROPOFOL N/A 12/13/2021   Procedure: COLONOSCOPY WITH PROPOFOL;  Surgeon: Regis Bill, MD;  Location: [facility] ENDOSCOPY;  Service: Endoscopy;  Laterality: N/A;   EYE SURGERY     KNEE SURGERY     NASAL SEPTUM SURGERY     Patient Active Problem List   Diagnosis Date Noted   Chronic idiopathic constipation 06/05/2022   Left lower quadrant abdominal pain 06/05/2022   Paroxysmal atrial fibrillation 03/28/2022   Idiopathic hypotension 03/28/2022   Spondylolysis, lumbar region 03/28/2022   Avitaminosis D 03/28/2022   Myalgia due to statin 03/28/2022   Acquired thrombophilia 03/28/2022   Hyperlipidemia 03/28/2022   Abnormal gait 03/28/2022   Mild intermittent asthma without complication 03/28/2022   Zenker's (hypopharyngeal) diverticulum 03/22/2018   Gastroesophageal reflux disease 03/22/2018   Fatty liver 10/09/2017   BPH (benign prostatic hyperplasia) 11/21/2014   Hypothyroidism 11/21/2014   Inguinal hernia 11/21/2014   OA (osteoarthritis) 11/21/2014   H/O adenomatous polyp of colon 10/14/2014    ONSET DATE: Parkinson's confirmed 08/17/2022  REFERRING DIAG: Parkinson's, back pain  THERAPY DIAG:  No diagnosis found.  Rationale  for Evaluation and Treatment: Rehabilitation  SUBJECTIVE:                                                                                                                                                                                             SUBJECTIVE STATEMENT: ***  PERTINENT HISTORY: Patient is a 72 y.o. male who presents to outpatient physical therapy with a referral for medical diagnosis of Parkinson's and back pain. This patient's chief complaints consist of ***, leading to the following functional deficits: ***. Relevant past medical history and comorbidities include chronic back pain, asthma, Zenker's (hypopharyngeal) diverticulum, afib, GERD, hx of fatty liver, hypothyroidism, BPH, OA, hx adeomatous polyp of colon, inguinal hernia,  L knee pain, hx of dysplastic nevus, colon surgery (7 inches removed of colon), cataract extraction, L knee pain.  Patient denies hx of {redflags:27294}    PAIN:  Are you having pain? Yes: NPRS scale: Current: ***/10,  Best: ***/10, Worst: ***/10. 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   PRECAUTIONS: {Therapy precautions:24002}  WEIGHT BEARING RESTRICTIONS: {Yes ***/No:24003}  FALLS: Has patient fallen in last 6 months? {fallsyesno:27318}  LIVING ENVIRONMENT: Lives with: alone Lives in: {Lives in:25570} Stairs: {opstairs:27293} Has following equipment at home: {Assistive devices:23999}  OCCUPATION: retired  PLOF: Independent  PATIENT GOALS: ***  OBJECTIVE:   DIAGNOSTIC FINDINGS:  Per Dr. 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 MRI with NeuroQuant analysis remarkable for hippocampal volume in the 3rd percentile  SynOne Biopsy . . .  showed evidence of alpha-synuclein, a protein associated with  Parkinson Disease.  Syn-One Biopsy 07/18/2022:  Synucleinopathy: There is pathologic evidence of phosphorylated alpha-synuclein deposition within cutaneous nerves. This finding is consistent with a diagnosis of an alpha-synucleinopathy. Clinical correlation is required to distinguish the type of synucleinopathy. Small Fiber Neuropathy: There was normal intraepidermal nerve fiber density. A normal intraepidermal nerve fiber density does not exclude a small fiber neuropathy or neurodegenerative process. Amyloidosis: There is no pathologic evidence of amyloid deposition in cutaneous nerves. A normal [place] red stain does not exclude a diagnosis of amyloidosis.   MRI brain without contrast with NeuroQuant 07/07/2022 NeuroQuant Findings:  Volumetric analysis of the brain was performed, with a fully  detailed report in [employer]. Briefly, the comparison with age and  gender matched reference reveals congruent age normal brain volume.  Note there is under tagging of the cortex especially in the frontal  parietal region which is better preserved than suggested on the  tables.   IMPRESSION:  1. Unremarkable brain MRI. No reversible finding.  2. NeuroQuant volumetric analysis of the brain, see details on  [employer].   COGNITION: Overall cognitive status: Within functional limits for tasks assessed  SELF- REPORTED FUNCTION FOTO score: ***/100 (*** questionnaire)  OBSERVATION/INSPECTION Posture Posture (seated): forward head, rounded shoulders, slumped in sitting.  Posture (standing): *** Posture correction: *** Anthropometrics Tremor: none Body composition: *** Muscle bulk: *** Skin: The incision sites appear to be healing well with no excessive redness, warmth, drainage or signs of infection present.  *** Edema: *** Functional Mobility Bed mobility: *** Transfers: *** Gait: grossly WFL for household and short community ambulation. More detailed gait analysis deferred to later date as  needed. *** Stairs: ***  SPINE MOTION  LUMBAR SPINE AROM *Indicates pain Flexion: *** Extension: *** Side Flexion:   R ***  L *** Rotation:  R *** L *** Side glide:  R *** L ***   NEUROLOGICAL  Upper Motor Neuron Screen Babinski, Hoffman's and Clonus (ankle) negative bilaterally.  Dermatomes C2-T1 appears equal and intact to light touch except the following: *** L2-S2 appears equal and intact to light touch except the following: *** Deep Tendon Reflexes R/L  ***+/***+ Biceps brachii reflex (C5, C6) ***+/***+ Brachioradialis reflex (C6) ***+/***+ Triceps brachii reflex (C7) ***+/***+ Quadriceps reflex (L4) ***+/***+ Achilles reflex (S1)  SPINE MOTION  CERVICAL SPINE AROM *Indicates pain Flexion: *** Extension: *** Side Flexion:   R ***  L *** Rotation:  R *** L ***   PERIPHERAL  JOINT MOTION (in degrees)  ACTIVE RANGE OF MOTION (AROM) *Indicates pain Date Date Date  Joint/Motion R/L R/L R/L  Shoulder     Flexion / / /  Extension / / /  Abduction  / / /  External rotation / / /  Internal rotation / / /  Elbow     Flexion  / / /  Extension  / / /  Wrist     Flexion / / /  Extension  / / /  Radial deviation / / /  Ulnar deviation / / /  Pronation / / /  Supination / / /  Hip     Flexion / / /  Extension  / / /  Abduction / / /  Adduction / / /  External rotation / / /  Internal rotation  / / /  Knee     Extension / / /  Flexoin / / /  Ankle/Foot     Dorsiflexion (knee ext) / / /  Dorsiflexion (knee flex) / / /  Plantarflexion / / /  Everison / / /  Inversion / / /  Great toe extension / / /  Great toe flexion / / /  Comments:   PASSIVE RANGE OF MOTION (PROM) *Indicates pain Date Date Date  Joint/Motion R/L R/L R/L  Shoulder     Flexion / / /  Extension / / /  Abduction  / / /  External rotation / / /  Internal rotation / / /  Elbow     Flexion  / / /  Extension  / / /  Wrist     Flexion / / /  Extension  / / /  Radial  deviation / / /  Ulnar deviation / / /  Pronation / / /  Supination / / /  Hip     Flexion  / / /  Extension  / / /  Abduction / / /  Adduction / / /  External rotation / / /  Internal rotation  / / /  Knee     Extension / / /  Flexion / / /  Ankle/Foot     Dorsiflexion (knee ext) / / /  Dorsiflexion (knee flex) / / /  Plantarflexion / / /  Everison / / /  Inversion / / /  Great toe extension / / /  Great toe flexion / / /  Comments:   MUSCLE PERFORMANCE (MMT):  *Indicates pain Date Date Date  Joint/Motion R/L R/L R/L  Shoulder     Flexion / / /  Abduction (C5) / / /  External rotation / / /  Internal rotation / / /  Extension / / /  Elbow     Flexion (C6) / / /  Extension (C7) / / /  Wrist     Flexion (C7) / / /  Extension (C6) / / /  Radial deviation / / /  Ulnar deviation (C8) / / /  Pronation / / /  Supination / / /  Hand     Thumb extension (C8) / / /  Finger abduction (T1) / / /  Grip (C8) / / /  Hip     Flexion (L1, L2) / / /  Extension (knee ext) / / /  Extension (knee flex) / / /  Abduction / / /  Adduction / / /  External rotation / / /  Internal rotation  / / /  Knee     Extension (L3) / / /  Flexion (S2) / / /  Ankle/Foot     Dorsiflexion (L4) / / /  Great toe extension (L5) / / /  Eversion (S1) / / /  Plantarflexion (S1) / / /  Inversion / / /  Pronation / / /  Great toe flexion / / /  Comments:   SPECIAL TESTS:  .Neurodynamictests .NeurodynamicUE .NeurodynamicLE .CspineInstability .CSPINESPECIALTESTS .SHOULDERSPECIALTESTCLUSTERS .HIPSPECIALTESTS .SIJSPECIALTESTS   SHOULDER SPECIAL TESTS RTC, Impingement, Anterior Instability (macrotrauma), Labral Tear: Painful arc test: R = ***, L = ***. Drop arm test: R = ***, L = ***. Hawkins-Kennedy test: R = ***, L = ***. Infraspinatus test: R = ***, L = ***. Apprehension test: R = ***, L = ***. Relocation test: R = ***, L = ***. Active compression test: R = ***, L =  ***.  ACCESSORY MOTION: ***  PALPATION: ***  SUSTAINED POSITIONS TESTING:  ***  REPEATED MOTIONS TESTING: ***  FUNCTIONAL/BALANCE TESTS: Five Time Sit to Stand (5TSTS): *** seconds Functional Gait Assessment (FGA): ***/30 (see details above) Ten meter walking trial (10MWT): *** m/s Six Minute Walk Test (6MWT): *** feet Timed Up and Go (TUG): *** seconds   Dynamic Gait Index: ***/24 BERG Balance Scale: ***/56 Tinetti/POMA: ***/28 Timed Up and GO: *** seconds (average of 3 trials) Trial 1: *** Trial 2: *** Trial 3: *** Romberg test: -Narrow stance, eyes open: *** seconds -Narrow stance, eyes closed: *** seconds Sharpened Romberg test: -Tandem stance, eyes open: *** seconds -Tandem stance, eyes closed: *** seconds  Narrow stance, firm surface, eyes open: *** seconds Narrow stance, firm surface, eyes closed: *** seconds Narrow stance, compliant surface, eyes open: *** seconds Narrow stance, compliant surface, eyes closed: *** seconds Single leg stance, firm surface, eyes open: R= *** seconds, L= *** seconds Single leg stance, compliant surface, eyes open: R= *** seconds, L= *** seconds Gait speed: *** m/s Functional reach test: *** inches     FUNCTIONAL TESTS:  {Functional tests:24029}  PATIENT SURVEYS:  {rehab surveys:24030}  TODAY'S TREATMENT:  education  PATIENT EDUCATION: Education details: *** Person educated: {Person educated:25204} Education method: {Education Method:25205} Education comprehension: {Education Comprehension:25206}  HOME EXERCISE PROGRAM: ***  GOALS: Goals reviewed with patient? No  SHORT TERM GOALS: Target date: 09/20/2022  Patient will be independent with initial home exercise program for self-management of symptoms. Baseline: {HEPbaseline4:27310} (09/06/22); Goal status: INITIAL   LONG TERM GOALS: Target date: 11/29/2022  Patient will be independent with a long-term home exercise program for self-management of symptoms.   Baseline: {HEPbaseline4:27310} (09/06/22); Goal status: INITIAL  2.  Patient will demonstrate improved FOTO to equal or greater than *** by visit #*** to demonstrate improvement in overall condition and self-reported functional ability.  Baseline: *** (09/06/22); Goal status: INITIAL  3.  *** Baseline: *** (09/06/22); Goal status: INITIAL  4.  *** Baseline: *** (09/06/22); Goal status: INITIAL  5.  Patient will complete community, work and/or recreational activities without limitation due to current condition.  Baseline: *** (09/06/22); Goal status: INITIAL  6.  *** Baseline: *** Goal status: INITIAL  ASSESSMENT:  CLINICAL IMPRESSION: Patient is a 72 y.o. male referred to outpatient physical therapy with a medical diagnosis of Parkinson's and back pain who presents with signs and symptoms consistent with ***. Patient presents with significant *** impairments that are limiting ability to complete *** without difficulty. Patient will benefit from skilled physical therapy intervention to address current body structure impairments and activity limitations to improve  function and work towards goals set in current POC in order to return to prior level of function or maximal functional improvement.   OBJECTIVE IMPAIRMENTS: {opptimpairments:25111}.   ACTIVITY LIMITATIONS: {activitylimitations:27494}  PARTICIPATION LIMITATIONS: {participationrestrictions:25113}  PERSONAL FACTORS: {Personal factors:25162} are also affecting patient's functional outcome.   REHAB POTENTIAL: {rehabpotential:25112}  CLINICAL DECISION MAKING: {clinical decision making:25114}  EVALUATION COMPLEXITY: {Evaluation complexity:25115}  PLAN:  PT FREQUENCY: 1-2x/week  PT DURATION: 12 weeks  PLANNED INTERVENTIONS: Therapeutic exercises, Therapeutic activity, Neuromuscular re-education, Balance training, Gait training, Patient/Family education, Self Care, Joint mobilization, Stair training, DME instructions,  Dry Needling, Electrical stimulation, Spinal mobilization, Cryotherapy, Moist heat, Manual therapy, and Re-evaluation  PLAN FOR NEXT SESSION: update HEP as appropriate, progressive LE/trunk/UE/functional strength and ROM exercises, balance and gait exercises, large amplitude movements.    Luretha Murphy. Ilsa Iha, PT, DPT 09/06/22, 4:47 PM  [facility] [address] [place], [place] [address] P: [phone] I F: [phone]

## 2022-09-07 ENCOUNTER — Encounter: Payer: Self-pay | Admitting: Physical Therapy

## 2022-09-07 ENCOUNTER — Other Ambulatory Visit: Payer: Self-pay

## 2022-09-07 ENCOUNTER — Ambulatory Visit: Payer: Medicare Other | Admitting: Physical Therapy

## 2022-09-07 DIAGNOSIS — R262 Difficulty in walking, not elsewhere classified: Secondary | ICD-10-CM | POA: Diagnosis not present

## 2022-09-07 DIAGNOSIS — R293 Abnormal posture: Secondary | ICD-10-CM

## 2022-09-07 DIAGNOSIS — G8929 Other chronic pain: Secondary | ICD-10-CM | POA: Diagnosis not present

## 2022-09-07 DIAGNOSIS — M546 Pain in thoracic spine: Secondary | ICD-10-CM | POA: Diagnosis not present

## 2022-09-07 DIAGNOSIS — M545 Low back pain, unspecified: Secondary | ICD-10-CM | POA: Diagnosis not present

## 2022-09-07 DIAGNOSIS — I950 Idiopathic hypotension: Secondary | ICD-10-CM

## 2022-09-07 DIAGNOSIS — M5459 Other low back pain: Secondary | ICD-10-CM

## 2022-09-07 DIAGNOSIS — E785 Hyperlipidemia, unspecified: Secondary | ICD-10-CM

## 2022-09-07 NOTE — Therapy (Signed)
OUTPATIENT PHYSICAL THERAPY TREATMENT NOTE / DISCHARGE SUMMARY Dates of reporting from 12/20/2021 to 09/07/2022  Patient Name: Daniel [name] MRN: [id number] DOB:09-18-1950, 72 y.o., male Today's Date: 09/07/2022  PCP: Maple Hudson., MD REFERRING PROVIDER: Burman Freestone, NP  END OF SESSION:   PT End of Session - 09/07/22 1606     Visit Number 40    Number of Visits 41    Date for PT Re-Evaluation 09/04/22    Authorization Type [employer] MEDICARE reporting period from 06/19/2022    Progress Note Due on Visit 40    PT Start Time 1605    PT Stop Time 1645    PT Time Calculation (min) 40 min    Equipment Utilized During Treatment Gait belt    Activity Tolerance Patient tolerated treatment well;Patient limited by fatigue;Patient limited by pain    Behavior During Therapy [facility] for tasks assessed/performed              Past Medical History:  Diagnosis Date   Actinic keratosis    Asthma    Eczema    Hx of dysplastic nevus 06/06/2006   Mid lower back, slight atypia   Hypothyroidism    Seasonal allergies    Thyroid disease    Past Surgical History:  Procedure Laterality Date   CATARACT EXTRACTION     COLON SURGERY     COLONOSCOPY WITH PROPOFOL N/A 11/18/2014   Procedure: COLONOSCOPY WITH PROPOFOL;  Surgeon: Scot Jun, MD;  [facility];  Service: Endoscopy;  Laterality: N/A;   COLONOSCOPY WITH PROPOFOL N/A 12/13/2021   Procedure: COLONOSCOPY WITH PROPOFOL;  Surgeon: Regis Bill, MD;  Location: [facility] ENDOSCOPY;  Service: Endoscopy;  Laterality: N/A;   EYE SURGERY     KNEE SURGERY     NASAL SEPTUM SURGERY     Patient Active Problem List   Diagnosis Date Noted   Chronic idiopathic constipation 06/05/2022   Left lower quadrant abdominal pain 06/05/2022   Paroxysmal atrial fibrillation 03/28/2022   Idiopathic hypotension 03/28/2022   Spondylolysis, lumbar region 03/28/2022   Avitaminosis D 03/28/2022   Myalgia due to statin 03/28/2022    Acquired thrombophilia 03/28/2022   Hyperlipidemia 03/28/2022   Abnormal gait 03/28/2022   Mild intermittent asthma without complication 03/28/2022   Zenker's (hypopharyngeal) diverticulum 03/22/2018   Gastroesophageal reflux disease 03/22/2018   Fatty liver 10/09/2017   BPH (benign prostatic hyperplasia) 11/21/2014   Hypothyroidism 11/21/2014   Inguinal hernia 11/21/2014   OA (osteoarthritis) 11/21/2014   H/O adenomatous polyp of colon 10/14/2014    REFERRING DIAG: lumbar DDD (degenerative disc disease), lumbar stenosis with neurogenic claudication, lumbar radiculitis  THERAPY DIAG:  Other low back pain  Pain in thoracic spine  Abnormal posture  Difficulty in walking, not elsewhere classified  Rationale for Evaluation and Treatment: Rehabilitation  PERTINENT HISTORY: Patient is a 72 y.o. male who presents to outpatient physical therapy with a referral for medical diagnosis lumbar DDD (degenerative disc disease, lumbar stenosis with neurogenic claudication, lumbar radiculitis. This patient's chief complaints consist of chronic pain in the low back and upper glutes that moves up towards his B scapulae with prolonged standing/walking and with intermittent shooting down his R left to the knee, leading to the following functional deficits: difficulty with anything that requires a lot of standing/walking, cooking for church/wine club, cleaning the house, waiting in line, standing longer than 5-20 min, having conversations with others while standing, yard work, Systems analyst, cleaning, sit <> stand, bending and lifting together,  staining deck, shopping, walking more than 2 blocks, social participation, changing positions in the bed, golfing.    Relevant past medical history and comorbidities include asthma, Zenker's (hypopharyngeal) diverticulum, afib, GERD, hx of fatty liver, hypothyroidism, BPH, OA, hx adeomatous polyp of colon, inguinal hernia, L knee pain, hx of dysplastic nevus,  colon surgery (7 inches removed of colon), cataract extraction, L knee pain.  Patient denies hx of cancer, stroke, seizures, lung problem, diabetes, unexplained weight loss, unexplained changes in bowel or bladder problems, unexplained stumbling or dropping things, spinal surgery. He has been exercising daily.     PRECAUTIONS: Parkinson's  SUBJECTIVE: Patient reports his back has been doing pretty well. He has pain mostly when he walks or stands too much, but it is not like it was. He plans to continue walking and doing back exercises to help keep his pain at bay. He is planning to try to get back into doing things like cooking at church. He was able to a lot more outdoor stuff this weekend. He still has pain in the mornings that feels like it is hard to walk but it gets better once he takes his meds and moves around. Pateint states he has a good HEP that he is following that he is comfortable with.   PAIN:  Are you having pain? NPRS 0/10 across low back  OBJECTIVE   SELF-REPORTED FUNCTION FOTO score: 79/100 (lumbar spine questionnaire)   FUNCTIONAL/BALANCE TEST 6 Minute Walk Test: 1410 feet with no AD, mild pain at upper back by end.    - floor to waist lift with box 10-25# with 5# increase each rep, then 1x10 reps at 25#.    TODAY'S TREATMENT  Therapeutic exercise: to centralize symptoms and improve ROM, strength, muscular endurance, and activity tolerance required for successful completion of functional activities.  - testing to assess progress at discharge (see above)  -  alternating lateral stepping over 8 inch small aerobic step with 5#AW on each foot, B UE support, 3x10 each side.   Superset:  - seated thoracic extension over towel roll in chair with, 3x10. - sit <> stand/squat with overhead press with 5#DB in each hand. From 18 inch chair, 3x10. Cuing for improved UE position and encouragement to complete. Limited most by LE fatigue and incomplete over head movement. .   -  review of HEP  Pt required multimodal cuing for proper technique and to facilitate improved neuromuscular control, strength, range of motion, and functional ability resulting in improved performance and form.   PATIENT EDUCATION:  Education details: Exercise purpose/form. Self management techniques. Discharge recommendations and resources.  Reviewed cancelation/no-show policy with patient and confirmed patient has correct phone number for clinic; patient verbalized understanding (01/18/22). Person educated: Patient Education method: Explanation, demonstration, VC, TC Education comprehension: verbalized understanding, demonstrated understanding, and needs further education     HOME EXERCISE PROGRAM: Access Code: [email or web address] URL: [email or web address] Date: 06/26/2022 Prepared by: [name]  Exercises - Sidelying Thoracic Rotation with Open Book  - 1-2 x daily - 3 sets - 10 reps - 5 seconds hold - Bridge  - 1-2 x daily - 3 sets - 10 reps - 5 seconds hold - Supine Lower Trunk Rotation  - 1 x daily - 3 sets - 15 reps - Prone Press Up  - 1-2 x daily - 3 sets - 10 reps - Bilateral Bent Leg Lift  - 1-2 x daily - 3 sets - 10 reps - Seated Thoracic Lumbar Extension  - Seated Flexion  Stretch with Whole FoodsSwiss Ball  - 1 x daily - 1 sets - 20 reps - 5 seconds hold - Baby Cobra Hands Hovering   - 1 x daily - 3 sets - 10 reps - 5 seconds hold - Standing Terminal Knee Extension with Resistance  - 2 x daily - 1 sets - 20 reps - 10 seconds hold  HOME EXERCISE PROGRAM [[id number]] View at "[email or web address]" using code: [email or web address]  Lumbar Extension over fulcrum (Mckenzie) -  Repeat 10 Repetitions, Complete 2 Sets, Perform 1 Times a Day  Lateral Hurdle Step Over -  Repeat 10 Repetitions, Complete 3 Sets, Perform 3 Times a Week  Thoracic Extension Over Chair -  Repeat 10 Repetitions, Hold 3 Seconds, Complete 3 Sets, Perform 3 Times a Week  Hip Thrusters -  Repeat 10 Repetitions, Complete 3 Sets,  Perform 3 Times a Week  Dynamic Warm up: Lunge walk with twist -  Repeat 10 Repetitions, Complete 3 Sets, Perform 3 Times a Week  Weighted Overhead Flexion -  Repeat 10 Repetitions, Complete 3 Sets, Perform 3 Times a Week   ASSESSMENT:   CLINICAL IMPRESSION: Patient has attended 40 physical therapy treatment sessions since starting current episode of care on 12/20/2021. Patient at first struggled to make progress towards goals and has struggled at times with consistent attendance. However, over the past few weeks he has been improving with multi-discipline interventions including medications and increasing overall activity. It was also recently confirmed he has Parkinson's that is likely contributing to some of his abnormal posture and difficulty with full ROM during movements. Patient is being discharged today due to improvement in condition and so he can start PT with focus on rehabilitation techniques for Parkinson's. Patient was provided with a long term HEP to help him with long term management of low back pain.   OBJECTIVE IMPAIRMENTS Abnormal gait, decreased activity tolerance, decreased endurance, decreased mobility, difficulty walking, decreased ROM, decreased strength, hypomobility, impaired perceived functional ability, increased muscle spasms, impaired flexibility, improper body mechanics, postural dysfunction, and pain.    ACTIVITY LIMITATIONS carrying, lifting, bending, standing, bed mobility, dressing, and locomotion level   PARTICIPATION LIMITATIONS: meal prep, cleaning, laundry, interpersonal relationship, shopping, community activity, yard work, church, and   asthma, Zenker's (hypopharyngeal) diverticulum, afib, GERD, hx of fatty liver, hypothyroidism, BPH, OA, hx adeomatous polyp of colon, inguinal hernia, L knee pain, hx of dysplastic nevus, colon surgery (7 inches removed of colon), cataract extraction, L knee pain   PERSONAL FACTORS Age, Past/current experiences, Time since  onset of injury/illness/exacerbation, and 3+ comorbidities:   asthma, Zenker's (hypopharyngeal) diverticulum, afib, GERD, hx of fatty liver, hypothyroidism, BPH, OA, hx adeomatous polyp of colon, inguinal hernia, L knee pain, hx of dysplastic nevus, colon surgery (7 inches removed of colon), cataract extraction, L knee pain are also affecting patient's functional outcome.    REHAB POTENTIAL: Good   CLINICAL DECISION MAKING: Stable/uncomplicated   EVALUATION COMPLEXITY: Low     GOALS: Goals reviewed with patient? yes   SHORT TERM GOALS: Target date: 01/03/2022   Patient will be independent with initial home exercise program for self-management of symptoms. Baseline: Initial HEP to be provided at visit 2 as appropriate (12/20/21); initial HEP provided visit 2 (12/26/2021);  Goal status: met    LONG TERM GOALS: Target date: 03/14/2022. UPDATED to 06/07/2022 for all unmet goals on 03/15/2022. UPDATED to 09/04/2022 for all unmet goals on 06/12/2022.    Patient will be independent with a long-term home exercise program for self-management of symptoms.  Baseline: Initial HEP to be provided at visit 2 as appropriate (12/20/21); initial HEP provided visit 2 (12/26/2021); participating in HEP (01/23/2022); currently participating (03/15/2022; 06/12/2022); participating daily(09/07/2022);   Goal status: MET   2.  Patient will demonstrate improved FOTO to equal or greater than 67 by visit #11 to demonstrate improvement in overall condition and self-reported functional ability.  Baseline: 54 (12/20/21); 62 at visit # 10 (01/23/2022); 65 at visit #18 (03/15/2022); 63 at visit #29 (06/12/2022); 79 at visit #40 (09/07/2022);  Goal status: MET at visit #40   3.  Patient will ambulate equal or greater than 1200 feet on 6 Minute Walk Test with no increase in symptoms to demonstrate improved community ambulation.  Baseline: to be tested visit 2 as appropriate (12/20/21); 1,225' up to 2/10 NPS (12/26/2021); 1263 feet with  no AD, no complaint of pain while walking. Pain at waist when going to sit after walking. R knee pain (01/23/2022); 1347 feet with no AD, stiffening at about  3 min. Pain at right scapula up to 4-5/10. R knee pain (03/15/2022); 1400 feet with no AD, R flank pain increasing near  3 min and traveling to right upper trap region. Increased familiar pain at low back by end of walk NPRS 5/10 (06/12/2022); 1410 with no AD with mild increase in upper back symptoms (09/07/2022);  Goal status: Met 01/23/2022   4.  Patient will demonstrate the ability to lift 25# box floor to waist 1x10 times in a row with no increase in symptoms to improve his ability to bend and lift things around his home.  Baseline: reports difficulty with bending and lifting items at home  (12/20/21); 3 reps at 25#. Reported pain with each lift at low back increasing to 4/10 at 25# with request to stop (01/23/2022); 1x10 reps at 25#. Reported pain with each lift at low back increasing to 5/10 at 25#. Incomplete stand and occasionally missing touching floor (03/15/2022; 06/12/2022); MET (09/07/2022);  Goal status: MET   5.  Patient will report the ability to stand/walk equal or greater than 60 min before needing to sit to improve his ability to stand in line, socialize, and cook.  Baseline: 5-20 min (12/20/21); on his feet 60 min with salon pass on his back making hashbrown casserole this morning before he started to get pain, he feels he would not be able to do this without the salon pass (01/23/2022): Has not tested it recently, there is a party coming up where he will be cooking in November (03/15/2022); at least 1 hour before he has to sit down, he can clean 1 bathroom at a time (06/12/2022); standing 3-4 hours and walking unsure, he can do 2 blocks easily (09/07/2022);  Goal status: MET     PLAN: PT FREQUENCY: 1-2x/week   PT DURATION: 12 weeks   PLANNED INTERVENTIONS: Therapeutic exercises, Therapeutic activity, Neuromuscular re-education,  Patient/Family education, Joint mobilization, DME instructions, Dry Needling, Electrical stimulation, Spinal mobilization, Cryotherapy, Moist heat, Manual therapy, and Re-evaluation.   PLAN FOR NEXT SESSION: update HEP as appropriate, manual therapy and exercises for improved joint mobility in the spine.  Luretha Murphy. Ilsa Iha, PT, DPT 09/07/22, 6:27 PM  [facility] [address] [place], [place] [address] P: [phone] I F: [phone]

## 2022-09-13 ENCOUNTER — Telehealth: Payer: Self-pay

## 2022-09-13 NOTE — Progress Notes (Signed)
Care Management & Coordination Services Pharmacy Team Pharmacy Assistant   Name: Daniel [name]  MRN: [id number] DOB: 08-Sep-1950   Reason for Encounter: Appointment Reminder  Contacted patient to confirm in office appointment with Angelena Sole, PharmD on 09/15/2022 at 1000. {US HC Outreach:[id number]}  Primary concerns for visit include: ***   Chart review: Conditions to be addressed/monitored: GERD, Hypothyroidism, Osteoarthritis, and Paroxysmal A-FIB, Idiopathic Hypotension, Asthma, Hyperlipidemia,   Recent office visits:  06/05/2022 Shirlee Latch, MD (PCP Office Visit) for Follow-up- No medication changes noted, No orders placed, No follow-up noted, patient to follow-up in 1 week  03/28/2022 Shirlee Latch ,MD (PCP Office Visit) for Follow-up- Stopped: Famotidine, Fludrocortisone, Fluticasone 50 mcg, Methocarbamol 500 mg, Midodrine 2.5 mg, Multiple Vitamin, Lab orders placed, Referral to Neurology order placed,    Recent consult visits:  09/07/2022 Lesli Albee, PT (Physical Therapy) for Other low back pain- No medication changes noted, No orders placed,   08/30/2022 Lesli Albee, PT (Physical Therapy) for Other low back pain- No medication changes noted, No orders placed,   08/17/2022 Hemang Wallis Mart, MD (Neurology) for Primary Parkinsonism- No medication changes noted, No orders placed, patient to follow-up in 4 months  08/16/2022 Lesli Albee, PT (Physical Therapy) for Other low back pain- No medication changes noted, No orders placed,   08/02/2022 Lesli Albee, PT (Physical Therapy) for Other low back pain- No medication changes noted, No orders placed,   07/24/2022 Lesli Albee, PT (Physical Therapy) for Other low back pain- No medication changes noted, No orders placed,   07/19/2022 Lesli Albee, PT (Physical Therapy) for Other low back pain- No medication changes noted, No orders placed,   07/12/2022 Lesli Albee, PT (Physical Therapy) for Other low back pain- No  medication changes noted, No orders placed,   07/05/2022 Lesli Albee, PT (Physical Therapy) for Other low back pain- No medication changes noted, No orders placed,   07/03/2022 Irineo Axon, MD (Urology) for Follow-up- No medication changes noted, No orders placed, patient to follow-up in 1 year  06/28/2022 Lesli Albee, PT (Physical Therapy) for Other low back pain- No medication changes noted, No orders placed,   06/27/2022 Armida Sans, MD (Dermatology) for Post/op suture removal- No medication changes noted, No orders placed, No follow-up noted  06/26/2022 Lesli Albee, PT (Physical Therapy) for Other low back pain- No medication changes noted, No orders placed,   06/23/2022 Karle Barr (Ortho surgery) I am unable to view this note  06/20/2022 Armida Sans, MD (Dermatology) for Neoplasm of skin: Started: Mupirocin 2% 1 application topical daily, orders placed,   06/19/2022 Lesli Albee, PT (Physical Therapy) for Other low back pain- No medication changes noted, No orders placed,   06/09/2022 Hemang Wallis Mart, MD (Neurology Telephone Call) for Medication Management: Low Vit B12, should take 1000 mcg once a day, should get Vit B12 checked after 3 months to make sure it improved., Patient should increase by or start supplementing with 1000 units of Vitamin D3 once a day. Patient should get Vitamin D checked in 3 months to make sure levels have improved.  06/12/2022 Lesli Albee, PT (Physical Therapy) for Other low back pain- No medication changes noted, No orders placed,   06/08/2022 Elmon Kirschner, PA (Cardiology) for A-FIB- No medication changes noted, No orders placed, patient to follow-up in 6 months  06/07/2022 Alvera Novel, PT (Physical Therapy) for Other low back pain- No medication changes noted, No orders placed,   06/05/2022 Hemang Wallis Mart, MD (Neurology) for Initial Consult- Started: Carbidopa-Levodopa We  will send carbidopa/levodopa 25/100  mg 1 tab three times a day. Increase the dosage according to the schedule below as tolerated and needed. Morning Afternoon Evening Days 1 - 3 1/2 tab 1/2 tab 1/2 tab Days 4 and beyond 1 tab 1 tab 1 tab, Lab orders placed, MRI brain order placed, patient to follow-up in 2 months  05/31/2022 Lesli Albee, PT (Physical Therapy) for Other low back pain- No medication changes noted, No orders placed,   05/30/2022 Alphonzo Lemmings Meeler, NP (Physical medicine) for Follow-up- Increase tramadol  1.5 tablet po BID PRN, No orders placed, patient to follow-up in 6 months.  05/15/2022 Lesli Albee, PT (Physical Therapy) for Other low back pain- No medication changes noted, No orders placed,   05/15/2022 Armida Sans, MD (Dermatology) for Annual Exam- No medication changes noted, Destruction of Lesion order placed, Patient to follow-up in 1 year  05/08/2022 Lesli Albee, PT (Physical Therapy) for Other low back pain- No medication changes noted, No orders placed,   05/03/2022 Lesli Albee, PT (Physical Therapy) for Other low back pain- No medication changes noted, No orders placed,   05/01/2022 Lesli Albee, PT (Physical Therapy) for Other low back pain- No medication changes noted, No orders placed,   04/26/2022 Lesli Albee, PT (Physical Therapy) for Other low back pain- No medication changes noted, No orders placed,   04/12/2022 Lesli Albee, PT (Physical Therapy) for Other low back pain- No medication changes noted, No orders placed,   03/29/2022 Lesli Albee, PT (Physical Therapy) for Other low back pain- No medication changes noted, No orders placed,   03/20/2022 Lesli Albee, PT (Physical Therapy) for Other low back pain- No medication changes noted, No orders placed,   03/15/2022 Lesli Albee, PT (Physical Therapy) for Other low back pain- No medication changes noted, No orders placed, Follow-up in 6 months  Hospital visits:  None in previous 6 months  Medications: Outpatient Encounter Medications as of  09/13/2022  Medication Sig   albuterol (VENTOLIN HFA) 108 (90 Base) MCG/ACT inhaler Inhale 2 puffs into the lungs every 6 (six) hours as needed for wheezing or shortness of breath.   carbidopa-levodopa (PARCOPA) 25-100 MG disintegrating tablet Take 1 tablet by mouth 3 (three) times daily.   ELIQUIS 5 MG TABS tablet Take 5 mg by mouth 2 (two) times daily.   gabapentin (NEURONTIN) 100 MG capsule Take 300 mg by mouth at bedtime.   levothyroxine (SYNTHROID) 100 MCG tablet TAKE 1 TABLET BY MOUTH EVERY DAY BEFORE BREAKFAST   metoprolol succinate (TOPROL-XL) 25 MG 24 hr tablet Take 25 mg by mouth daily.   mupirocin ointment (BACTROBAN) 2 % Apply 1 Application topically daily. Qd to excision site   pantoprazole (PROTONIX) 40 MG tablet TAKE 1 TABLET(40 MG) BY MOUTH TWICE DAILY   predniSONE (DELTASONE) 20 MG tablet Take 1 tablet (20 mg total) by mouth daily with breakfast.   traMADol (ULTRAM) 50 MG tablet Take 50 mg by mouth every 6 (six) hours as needed.   No facility-administered encounter medications on file as of 09/13/2022.   Do you have any problems getting your medications? {yes/no:20286}  If yes what types of problems are you experiencing? {Problems:27223}  What is your top health concern you would like to discuss at your upcoming visit?   Have you seen any other providers since your last visit with PCP? {yes/no:20286}   Star Rating Drugs:  None ID  Care Gaps: Annual wellness visit in last year? Yes, but he is up for AWV this year as the  one last year has expired.    Adelene Idler, CPA/CMA Clinical Pharmacist Assistant Phone: [phone]

## 2022-09-15 ENCOUNTER — Ambulatory Visit: Payer: Medicare Other

## 2022-09-15 DIAGNOSIS — I480 Paroxysmal atrial fibrillation: Secondary | ICD-10-CM

## 2022-09-15 DIAGNOSIS — K219 Gastro-esophageal reflux disease without esophagitis: Secondary | ICD-10-CM

## 2022-09-15 DIAGNOSIS — E559 Vitamin D deficiency, unspecified: Secondary | ICD-10-CM

## 2022-09-15 MED ORDER — PREDNISONE 20 MG PO TABS: 20.0000 mg | ORAL_TABLET | Freq: Every day | ORAL | 1 refills | Status: DC

## 2022-09-15 NOTE — Progress Notes (Signed)
Care Management & Coordination Services Pharmacy Note  09/15/2022 Name:  Daniel [name] MRN:  [id number] DOB:  January 03, 1951  Summary: Patient presents for initial consult.   -Patient with uncomplicated GERD, reports symptoms well controlled unless he has dietary indiscretions.   -Patient requests refill of prednisone to reduce pain while he is at assisting with reenactments at [facility]. Dr. Beryle Flock was amenable to refilling this prescription for him.   Recommendations/Changes made from today's visit: -INCREASE Vitamin D to 5000 units daily  -DECREASE pantoprazole to 40 mg daily  -Counseled patient on trigger avoidance, avoiding lying down after meals.   Follow up plan: CPP follow-up 9 months   Subjective: [name] is an 72 y.o. year old male who is a primary patient of Bacigalupo, Marzella Schlein, MD.  The care coordination team was consulted for assistance with disease management and care coordination needs.    Engaged with patient face to face for initial visit.  Recent office visits: 06/05/2022 Shirlee Latch, MD (PCP Office Visit) for Follow-up- No medication changes noted, No orders placed, No follow-up noted, patient to follow-up in 1 week   03/28/2022 Shirlee Latch ,MD (PCP Office Visit) for Follow-up- Stopped: Famotidine, Fludrocortisone, Fluticasone 50 mcg, Methocarbamol 500 mg, Midodrine 2.5 mg, Multiple Vitamin, Lab orders placed, Referral to Neurology order placed,   Recent consult visits: 09/07/2022 Lesli Albee, PT (Physical Therapy) for Other low back pain- No medication changes noted, No orders placed   06/20/2022 Armida Sans, MD (Dermatology) for Neoplasm of skin: Started: Mupirocin 2% 1 application topical daily, orders placed,   06/09/2022 Hemang Wallis Mart, MD (Neurology Telephone Call) for Medication Management: Low Vit B12, should take 1000 mcg once a day, should get Vit B12 checked after 3 months to make sure it improved., Patient should  increase by or start supplementing with 1000 units of Vitamin D3 once a day. Patient should get Vitamin D checked in 3 months to make sure levels have improved.   06/12/2022 Lesli Albee, PT (Physical Therapy) for Other low back pain- No medication changes noted, No orders placed,    06/08/2022 Elmon Kirschner, PA (Cardiology) for A-FIB- No medication changes noted, No orders placed, patient to follow-up in 6 months   06/07/2022 Alvera Novel, PT (Physical Therapy) for Other low back pain- No medication changes noted, No orders placed,    06/05/2022 Hemang Wallis Mart, MD (Neurology) for Initial Consult- Started: Carbidopa-Levodopa We will send carbidopa/levodopa 25/100 mg 1 tab three times a day. Increase the dosage according to the schedule below as tolerated and needed. Morning Afternoon Evening Days 1 - 3 1/2 tab 1/2 tab 1/2 tab Days 4 and beyond 1 tab 1 tab 1 tab, Lab orders placed, MRI brain order placed, patient to follow-up in 2 months   05/31/2022 Lesli Albee, PT (Physical Therapy) for Other low back pain- No medication changes noted, No orders placed,    05/30/2022 Alphonzo Lemmings Meeler, NP (Physical medicine) for Follow-up- Increase tramadol 50mg  1.5 tablet po BID PRN, No orders placed, patient to follow-up in 6 months.   05/15/2022 Lesli Albee, PT (Physical Therapy) for Other low back pain- No medication changes noted, No orders placed,    05/15/2022 Armida Sans, MD (Dermatology) for Annual Exam- No medication changes noted, Destruction of Lesion order placed, Patient to follow-up in 1 year   Hospital visits:  None in previous 6 months  Hospital visits: None in previous 6 months   Objective:  Lab Results  Component Value Date   CREATININE 1.07  03/28/2022   BUN 18 03/28/2022   EGFR 74 03/28/2022   GFRNONAA 76 05/27/2020   GFRAA 88 05/27/2020   NA 143 03/28/2022   K 4.1 03/28/2022   CALCIUM 9.3 03/28/2022   CO2 23 03/28/2022   GLUCOSE 95 03/28/2022    Lab  Results  Component Value Date/Time   HGBA1C 5.4 08/24/2021 10:57 AM   HGBA1C 5.6 05/17/2021 09:20 AM    Last diabetic Eye exam: No results found for: "HMDIABEYEEXA"  Last diabetic Foot exam: No results found for: "HMDIABFOOTEX"   Lab Results  Component Value Date   CHOL 203 (H) 03/28/2022   HDL 73 03/28/2022   LDLCALC 116 (H) 03/28/2022   TRIG 78 03/28/2022   CHOLHDL 2.8 03/28/2022       Latest Ref Rng & Units 03/28/2022   10:54 AM 11/16/2021    9:34 AM 08/24/2021   10:57 AM  Hepatic Function  Total Protein 6.0 - 8.5 g/dL 6.6   6.7   Albumin 3.8 - 4.8 g/dL 4.3  4.1  4.3   AST 0 - 40 IU/L 11   15   ALT 0 - 44 IU/L 7   15   Alk Phosphatase 44 - 121 IU/L 69   74   Total Bilirubin 0.0 - 1.2 mg/dL 0.7   1.0     Lab Results  Component Value Date/Time   TSH 1.180 03/28/2022 10:54 AM   TSH 1.860 11/16/2021 09:34 AM       Latest Ref Rng & Units 03/28/2022   10:54 AM 11/16/2021    9:34 AM 08/24/2021   10:57 AM  CBC  WBC 3.4 - 10.8 x10E3/uL 7.6  5.9  7.1   Hemoglobin 13.0 - 17.7 g/dL 16.1  09.6  04.5   Hematocrit 37.5 - 51.0 % 46.6  48.4  46.7   Platelets 150 - 450 x10E3/uL 275  258  233     Lab Results  Component Value Date/Time   VD25OH 27.0 (L) 03/28/2022 10:54 AM   VD25OH 24.1 (L) 05/17/2021 09:20 AM   VITAMINB12 363 05/17/2021 09:20 AM    Clinical ASCVD: No  The 10-year ASCVD risk score (Arnett DK, et al., 2019) is: 19.3%   Values used to calculate the score:     Age: 72 years     Sex: Male     Is Non-Hispanic African American: No     Diabetic: No     Tobacco smoker: No     Systolic Blood Pressure: 128 mmHg     Is BP treated: No     HDL Cholesterol: 73 mg/dL     Total Cholesterol: 203 mg/dL       40/98/1191   47:82 AM 12/05/2021    8:20 AM 06/23/2021    8:51 AM  Depression screen PHQ 2/9  Decreased Interest 0 0 0  Down, Depressed, Hopeless 0 0 0  PHQ - 2 Score 0 0 0  Altered sleeping 0 0 0  Tired, decreased energy 0 0 0  Change in appetite 0 0 0   Feeling bad or failure about yourself  0 0 0  Trouble concentrating 0 0 0  Moving slowly or fidgety/restless 0 0 0  Suicidal thoughts 0 0 0  PHQ-9 Score 0 0 0  Difficult doing work/chores Not difficult at all Not difficult at all Not difficult at all     Social History   Tobacco Use  Smoking Status Former   Types: Cigarettes   Quit date: 1991  Years since quitting: 33.3  Smokeless Tobacco Never  Tobacco Comments   quit smoking 25 years ago   BP Readings from Last 3 Encounters:  07/03/22 123/78  06/05/22 127/79  05/15/22 135/88   Pulse Readings from Last 3 Encounters:  07/03/22 (!) 54  06/05/22 (!) 56  05/15/22 60   Wt Readings from Last 3 Encounters:  07/03/22 200 lb (90.7 kg)  06/05/22 212 lb (96.2 kg)  03/28/22 205 lb 14.4 oz (93.4 kg)   BMI Readings from Last 3 Encounters:  07/03/22 25.68 kg/m  06/05/22 27.22 kg/m  03/28/22 26.44 kg/m    Allergies  Allergen Reactions   Ciprofloxacin Hcl Other (See Comments)    Blisters break out   Levaquin [Levofloxacin In D5w]    Sulfa Antibiotics Other (See Comments)    unknown    Medications Reviewed Today     Reviewed by Cira Rue, PT (Physical Therapist) on 09/07/22 at 1607  Med List Status: <None>   Medication Order Taking? Sig Documenting Provider Last Dose Status Informant  albuterol (VENTOLIN HFA) 108 (90 Base) MCG/ACT inhaler [id number] No Inhale 2 puffs into the lungs every 6 (six) hours as needed for wheezing or shortness of breath. Debera Lat, PA-C Taking Active   carbidopa-levodopa (PARCOPA) 25-100 MG disintegrating tablet [id number] No Take 1 tablet by mouth 3 (three) times daily. [provider] Taking Active Self  ELIQUIS 5 MG TABS tablet [id number] No Take 5 mg by mouth 2 (two) times daily. [provider] Taking Active   gabapentin (NEURONTIN) 100 MG capsule [id number] No Take 300 mg by mouth at bedtime. [provider] Taking Active   levothyroxine (SYNTHROID) 100  MCG tablet [id number]  TAKE 1 TABLET BY MOUTH EVERY DAY BEFORE BREAKFAST Bacigalupo, Marzella Schlein, MD  Active   metoprolol succinate (TOPROL-XL) 25 MG 24 hr tablet [id number] No Take 25 mg by mouth daily. [provider] Taking Active   mupirocin ointment (BACTROBAN) 2 % [id number] No Apply 1 Application topically daily. Qd to excision site Deirdre Evener, MD Taking Active   pantoprazole (PROTONIX) 40 MG tablet [id number] No TAKE 1 TABLET(40 MG) BY MOUTH TWICE DAILY Bosie Clos, MD Taking Active   predniSONE (DELTASONE) 20 MG tablet [id number] No Take 1 tablet (20 mg total) by mouth daily with breakfast. Erasmo Downer, MD Taking Active   traMADol (ULTRAM) 50 MG tablet [id number] No Take 50 mg by mouth every 6 (six) hours as needed. [provider] Taking Active             SDOH:  (Social Determinants of Health) assessments and interventions performed: Yes SDOH Interventions    Flowsheet Row Office Visit from 12/05/2021 in [facility] Office Visit from 02/16/2021 in [facility]  SDOH Interventions    Depression Interventions/Treatment  PHQ2-9 Score <4 Follow-up Not Indicated PHQ2-9 Score <4 Follow-up Not Indicated       Medication Assistance: None required.  Patient affirms current coverage meets needs.  Medication Access: Within the past 30 days, how often has patient missed a dose of medication? None Is a pillbox or other method used to improve adherence? Yes  Factors that may affect medication adherence? no barriers identified Are meds synced by current pharmacy? No  Are meds delivered by current pharmacy? No  Does patient experience delays in picking up medications due to transportation concerns? No   [facility] Services Reviewed: Is patient disadvantaged to use [facility]?: Yes  Current Rx insurance  plan: BCBS Name and location of Current pharmacy:  [id number] - [place], [place] -  [address] AT [facility] [address] [facility] [address] [facility] [address] [facility] [address] [place] [address] Phone: [phone] Fax: [phone]  [facility] services reviewed with patient today?: Yes  Patient requests to transfer care to [facility]?: No  Reason patient declined to change pharmacies: Disadvantaged due to insurance/mail order  Compliance/Adherence/Medication fill history: Care Gaps: Annual wellness visit in last year? Yes   Star-Rating Drugs: None ID    Assessment/Plan  Atrial Fibrillation (Goal: prevent stroke and major bleeding) -Controlled -CHADSVASC: 1 -Current treatment: Rate control: Metoprolol XL 25 mg  Anticoagulation: Eliquis 5 mg twice daily  -Medications previously tried: NA -Counseled on avoidance of NSAIDs due to increased bleeding risk with anticoagulants; -Recommended to continue current medication  COPD (Goal: control symptoms and prevent exacerbations) -Controlled -Current treatment  Albuterol HFA 2 puffs every 6 hours as needed  -Medications previously tried: NA  -MMRC score: 1 -Pulmonary function testing: NA -Exacerbations requiring treatment in last 6 months: None -Frequency of rescue inhaler use: <1x monthly; primarily with significant exertion.  -Recommended to continue current medication  Parkinson's (Goal: Minimize symptoms) -Controlled -Current treatment  Carbidopa-levodopa 25-100 mg TID  -Medications previously tried: NA  -Recommended to continue current medication  Vitamin D deficiency (Goal: Vitamin D >30) -Uncontrolled -Current treatment  Vitamin D3 2000 units daily  -Medications previously tried: MA -INCREASE Vitamin D to 5000 units daily   GERD (Goal: Minimize symptoms) -Controlled -Current treatment  Pantoprazole 40 mg twice daily -Medications previously tried: NA -Patient with uncomplicated GERD, reports symptoms well controlled unless he has dietary indiscretions.  -Counseled patient on trigger  avoidance, avoiding lying down after meals.  -DECREASE pantoprazole to 40 mg daily   Follow Up Plan: Telephone follow up appointment with care management team member scheduled for: 05/31/2022 at 11:00 AM  Angelena Sole, PharmD, Patsy Baltimore, CPP  Clinical Pharmacist Practitioner  [facility] [phone]

## 2022-09-15 NOTE — Patient Instructions (Addendum)
Visit Information It was great speaking with you today!  Please let me know if you have any questions about our visit.  Plan:  I would recommend increasing your Vitamin D3 supplement to 5000 units daily  Let's try decreasing your pantoprazole to once daily and see how your reflux symptoms do.  I will speak with Dr. Beryle Flock regarding your prednisone refill.   Print copy of patient instructions, educational materials, and care plan provided in person.  Telephone follow up appointment with pharmacy team member scheduled for: 06/10/2022 at 11:00 AM  Adelene Idler, CPA/CMA Clinical Pharmacist Assistant  Angelena Sole, PharmD, BCACP, CPP  Clinical Pharmacist Practitioner  [facility]   Phone: [phone]

## 2022-09-18 ENCOUNTER — Ambulatory Visit: Payer: Medicare Other | Admitting: Physical Therapy

## 2022-09-18 NOTE — Therapy (Deleted)
OUTPATIENT PHYSICAL THERAPY NEURO EVALUATION   Patient Name: Daniel [name] MRN: [id number] DOB:[birth year], 72 y.o., male Today's Date: 09/18/2022   PCP: *** REFERRING PROVIDER: ***  END OF SESSION:   Past Medical History:  Diagnosis Date   Actinic keratosis    Asthma    Eczema    Hx of dysplastic nevus 06/06/2006   Mid lower back, slight atypia   Hypothyroidism    Seasonal allergies    Thyroid disease    Past Surgical History:  Procedure Laterality Date   CATARACT EXTRACTION     COLON SURGERY     COLONOSCOPY WITH PROPOFOL N/A 11/18/2014   Procedure: COLONOSCOPY WITH PROPOFOL;  Surgeon: Scot Jun, MD;  Location: [facility]COPY;  Service: Endoscopy;  Laterality: N/A;   COLONOSCOPY WITH PROPOFOL N/A 12/13/2021   Procedure: COLONOSCOPY WITH PROPOFOL;  Surgeon: Regis Bill, MD;  Location: [facility] ENDOSCOPY;  Service: Endoscopy;  Laterality: N/A;   EYE SURGERY     KNEE SURGERY     NASAL SEPTUM SURGERY     Patient Active Problem List   Diagnosis Date Noted   Chronic idiopathic constipation 06/05/2022   Left lower quadrant abdominal pain 06/05/2022   Paroxysmal atrial fibrillation 03/28/2022   Idiopathic hypotension 03/28/2022   Spondylolysis, lumbar region 03/28/2022   Avitaminosis D 03/28/2022   Myalgia due to statin 03/28/2022   Acquired thrombophilia 03/28/2022   Hyperlipidemia 03/28/2022   Abnormal gait 03/28/2022   Mild intermittent asthma without complication 03/28/2022   Zenker's (hypopharyngeal) diverticulum 03/22/2018   Gastroesophageal reflux disease 03/22/2018   Fatty liver 10/09/2017   BPH (benign prostatic hyperplasia) 11/21/2014   Hypothyroidism 11/21/2014   Inguinal hernia 11/21/2014   OA (osteoarthritis) 11/21/2014   H/O adenomatous polyp of colon 10/14/2014    ONSET DATE: ***  REFERRING DIAG:  G20.A1 (ICD-10-CM) - Parkinson's disease  M54.9 (ICD-10-CM) - Back pain    THERAPY DIAG:  No diagnosis found.  Rationale for Evaluation  and Treatment: {HABREHAB:27488}  SUBJECTIVE:                                                                                                                                                                                             SUBJECTIVE STATEMENT: *** Pt accompanied by: {accompnied:27141}  PERTINENT HISTORY: Per [name] PT from 09/07/22 PT discharge from Low back treatment for focussed on PD related impairments at our clinic "Patient has attended 40 physical therapy treatment sessions since starting current episode of care on 12/20/2021. Patient at first struggled to make progress towards goals and has struggled at times with consistent attendance. However, over the past few weeks he  has been improving with multi-discipline interventions including medications and increasing overall activity. It was also recently confirmed he has Parkinson's that is likely contributing to some of his abnormal posture and difficulty with full ROM during movements. Patient is being discharged today due to improvement in condition and so he can start PT with focus on rehabilitation techniques for Parkinson's. Patient was provided with a long term HEP to help him with long term management of low back pain."  PAIN:  Are you having pain? {OPRCPAIN:27236}  PRECAUTIONS: {Therapy precautions:24002}  WEIGHT BEARING RESTRICTIONS: {Yes ***/No:24003}  FALLS: Has patient fallen in last 6 months? {fallsyesno:27318}  LIVING ENVIRONMENT: Lives with: {OPRC lives with:25569::"lives with their family"} Lives in: {Lives in:25570} Stairs: {opstairs:27293} Has following equipment at home: {Assistive devices:23999}  PLOF: {PLOF:24004}  PATIENT GOALS: ***  OBJECTIVE:   DIAGNOSTIC FINDINGS: ***  COGNITION: Overall cognitive status: {cognition:24006}   SENSATION: {sensation:27233}  COORDINATION: ***  EDEMA:  {edema:24020}  MUSCLE TONE: {LE tone:25568}  MUSCLE LENGTH: Hamstrings: Right *** deg; Left ***  deg Thomas test: Right *** deg; Left *** deg  DTRs:  {DTR SITE:24025}  POSTURE: {posture:25561}  LOWER EXTREMITY ROM:     {AROM/PROM:27142}  Right Eval Left Eval  Hip flexion    Hip extension    Hip abduction    Hip adduction    Hip internal rotation    Hip external rotation    Knee flexion    Knee extension    Ankle dorsiflexion    Ankle plantarflexion    Ankle inversion    Ankle eversion     (Blank rows = not tested)  LOWER EXTREMITY MMT:    MMT Right Eval Left Eval  Hip flexion    Hip extension    Hip abduction    Hip adduction    Hip internal rotation    Hip external rotation    Knee flexion    Knee extension    Ankle dorsiflexion    Ankle plantarflexion    Ankle inversion    Ankle eversion    (Blank rows = not tested)  BED MOBILITY:  {Bed mobility:24027}  TRANSFERS: Assistive device utilized: {Assistive devices:23999}  Sit to stand: {Levels of assistance:24026} Stand to sit: {Levels of assistance:24026} Chair to chair: {Levels of assistance:24026} Floor: {Levels of assistance:24026}  RAMP:  Level of Assistance: {Levels of assistance:24026} Assistive device utilized: {Assistive devices:23999} Ramp Comments: ***  CURB:  Level of Assistance: {Levels of assistance:24026} Assistive device utilized: {Assistive devices:23999} Curb Comments: ***  STAIRS: Level of Assistance: {Levels of assistance:24026} Stair Negotiation Technique: {Stair Technique:27161} with {Rail Assistance:27162} Number of Stairs: ***  Height of Stairs: ***  Comments: ***  GAIT: Gait pattern: {gait characteristics:25376} Distance walked: *** Assistive device utilized: {Assistive devices:23999} Level of assistance: {Levels of assistance:24026} Comments: ***  FUNCTIONAL TESTS:  {Functional tests:24029}  PATIENT SURVEYS:  {rehab surveys:24030}  TODAY'S TREATMENT:  DATE: ***    PATIENT EDUCATION: Education details: *** Person educated: {Person educated:25204} Education method: {Education Method:25205} Education comprehension: {Education Comprehension:25206}  HOME EXERCISE PROGRAM: ***  GOALS: Goals reviewed with patient? {yes/no:20286}  SHORT TERM GOALS: Target date: ***  *** Baseline: Goal status: {GOALSTATUS:25110}  2.  *** Baseline:  Goal status: {GOALSTATUS:25110}  3.  *** Baseline:  Goal status: {GOALSTATUS:25110}  4.  *** Baseline:  Goal status: {GOALSTATUS:25110}  5.  *** Baseline:  Goal status: {GOALSTATUS:25110}  6.  *** Baseline:  Goal status: {GOALSTATUS:25110}  LONG TERM GOALS: Target date: ***  *** Baseline:  Goal status: {GOALSTATUS:25110}  2.  *** Baseline:  Goal status: {GOALSTATUS:25110}  3.  *** Baseline:  Goal status: {GOALSTATUS:25110}  4.  *** Baseline:  Goal status: {GOALSTATUS:25110}  5.  *** Baseline:  Goal status: {GOALSTATUS:25110}  6.  *** Baseline:  Goal status: {GOALSTATUS:25110}  ASSESSMENT:  CLINICAL IMPRESSION: Patient is a *** y.o. *** who was seen today for physical therapy evaluation and treatment for ***.   OBJECTIVE IMPAIRMENTS: {opptimpairments:25111}.   ACTIVITY LIMITATIONS: {activitylimitations:27494}  PARTICIPATION LIMITATIONS: {participationrestrictions:25113}  PERSONAL FACTORS: {Personal factors:25162} are also affecting patient's functional outcome.   REHAB POTENTIAL: {rehabpotential:25112}  CLINICAL DECISION MAKING: {clinical decision making:25114}  EVALUATION COMPLEXITY: {Evaluation complexity:25115}  PLAN:  PT FREQUENCY: {rehab frequency:25116}  PT DURATION: {rehab duration:25117}  PLANNED INTERVENTIONS: {rehab planned interventions:25118::"Therapeutic exercises","Therapeutic activity","Neuromuscular re-education","Balance training","Gait training","Patient/Family education","Self Care","Joint mobilization"}  PLAN FOR  NEXT SESSION: ***   Norman Herrlich, PT 09/18/2022, 8:04 AM

## 2022-09-21 ENCOUNTER — Ambulatory Visit: Payer: Medicare Other

## 2022-09-22 MED ORDER — PANTOPRAZOLE SODIUM 40 MG PO TBEC
40.0000 mg | DELAYED_RELEASE_TABLET | Freq: Every day | ORAL | 3 refills | Status: DC
Start: 2022-09-22 — End: 2023-02-20

## 2022-09-25 NOTE — Therapy (Signed)
OUTPATIENT PHYSICAL THERAPY NEURO EVALUATION   Patient Name: Daniel [name] MRN: [id number] DOB:Aug 10, 1950, 72 y.o., male Today's Date: 09/26/2022   PCP:    Denzil Magnuson   REFERRING PROVIDER: Lonell Face, MD   END OF SESSION:  PT End of Session - 09/26/22 1554     Visit Number 1    Number of Visits 24    Date for PT Re-Evaluation 12/19/22    Progress Note Due on Visit 10    PT Start Time 1600    PT Stop Time 1645    PT Time Calculation (min) 45 min    Equipment Utilized During Treatment Gait belt    Activity Tolerance Patient tolerated treatment well;Patient limited by fatigue;Patient limited by pain    Behavior During Therapy [facility] for tasks assessed/performed             Past Medical History:  Diagnosis Date   Actinic keratosis    Asthma    Eczema    Hx of dysplastic nevus 06/06/2006   Mid lower back, slight atypia   Hypothyroidism    Seasonal allergies    Thyroid disease    Past Surgical History:  Procedure Laterality Date   CATARACT EXTRACTION     COLON SURGERY     COLONOSCOPY WITH PROPOFOL N/A 11/18/2014   Procedure: COLONOSCOPY WITH PROPOFOL;  Surgeon: Scot Jun, MD;  Location: [facility] ENDOSCOPY;  Service: Endoscopy;  Laterality: N/A;   COLONOSCOPY WITH PROPOFOL N/A 12/13/2021   Procedure: COLONOSCOPY WITH PROPOFOL;  Surgeon: Regis Bill, MD;  Location: [facility] ENDOSCOPY;  Service: Endoscopy;  Laterality: N/A;   EYE SURGERY     KNEE SURGERY     NASAL SEPTUM SURGERY     Patient Active Problem List   Diagnosis Date Noted   Parkinson's disease 09/26/2022   Chronic idiopathic constipation 06/05/2022   Left lower quadrant abdominal pain 06/05/2022   Paroxysmal atrial fibrillation (HCC) 03/28/2022   Idiopathic hypotension 03/28/2022   Spondylolysis, lumbar region 03/28/2022   Avitaminosis D 03/28/2022   Myalgia due to statin 03/28/2022   Acquired thrombophilia (HCC) 03/28/2022   Hyperlipidemia 03/28/2022   Abnormal gait  03/28/2022   Mild intermittent asthma without complication 03/28/2022   Zenker's (hypopharyngeal) diverticulum 03/22/2018   Gastroesophageal reflux disease 03/22/2018   Fatty liver 10/09/2017   BPH (benign prostatic hyperplasia) 11/21/2014   Hypothyroidism 11/21/2014   Inguinal hernia 11/21/2014   OA (osteoarthritis) 11/21/2014   H/O adenomatous polyp of colon 10/14/2014    ONSET DATE: 04/28/2022 for PD, back pain began around 09/10/21   REFERRING DIAG:  G20.A1 (ICD-10-CM) - Parkinson's disease  M54.9 (ICD-10-CM) - Back pain    THERAPY DIAG:  Other low back pain  Pain in thoracic spine  Abnormal posture  Difficulty in walking, not elsewhere classified  Chronic bilateral low back pain, unspecified whether sciatica present  Rationale for Evaluation and Treatment: Rehabilitation  SUBJECTIVE:  SUBJECTIVE STATEMENT: Pt reports his back pain is better than it was but is is not completely gone. With his back he reports when he does his exercises, takes medications and gets moving around it does not hurt as bad.   Pt suspected he had PD when his writing got very small. His Gait and forward lean is also a concern and his voice has changed and it feels like he has an allergy. Pt reports sometimes he feels like he is off balance. Pt reports his back would still limit him on long walking but not as much.   It appears pt back is more limiting than the PD at this time.  Pt accompanied by: self  PERTINENT HISTORY: Per [name] PT from 09/07/22 PT discharge from Low back treatment for focussed on PD related impairments at our clinic "Patient has attended 40 physical therapy treatment sessions since starting current episode of care on 12/20/2021. Patient at first struggled to make progress towards goals and  has struggled at times with consistent attendance. However, over the past few weeks he has been improving with multi-discipline interventions including medications and increasing overall activity. It was also recently confirmed he has Parkinson's that is likely contributing to some of his abnormal posture and difficulty with full ROM during movements. Patient is being discharged today due to improvement in condition and so he can start PT with focus on rehabilitation techniques for Parkinson's. Patient was provided with a long term HEP to help him with long term management of low back pain."  PAIN:  Are you having pain? Yes: NPRS scale: 0/10 Pain location: low back  Pain description: tight and achy  Aggravating factors: when laying down or getting up from sleep  Relieving factors: PT exercises, movement   PRECAUTIONS: None  WEIGHT BEARING RESTRICTIONS: No  FALLS: Has patient fallen in last 6 months? No  LIVING ENVIRONMENT: Lives with: lives alone Lives in: House/apartment Stairs: Yes: External: 5 steps; on left going up Has following equipment at home: None  PLOF: Independent  PATIENT GOALS: Improve his low back pain, improve posture, improve walking.   OBJECTIVE:   DIAGNOSTIC FINDINGS: MRI of L spine 07/12/21: Degenerative disc disease throughout the lumbar region with loss of disc height. No apparent compressive stenosis of the canal. Discogenic endplate marrow changes without active edema at L2-3, L3-4 and L4-5, which could correlate with regional axial back pain.   Left lateral recess and foraminal narrowing at L2-3, left foraminal narrowing at L3-4, and bilateral lateral recess and foraminal narrowing at L4-5. None of the stenoses are severe or are seen to result in definite neural compression.   Shallow left posterolateral disc herniation at L1-2 with caudal migration of a tiny amount of disc material to the left of midline. This does not appear to cause any significant  compressive effect upon the neural structures.  COGNITION: Overall cognitive status: Within functional limits for tasks assessed   SENSATION: WFL   POSTURE: rounded shoulders, forward head, and increased thoracic kyphosis  LOWER EXTREMITY ROM:   WNL LOWER EXTREMITY MMT:    MMT Right Eval Left Eval  Hip flexion 4+ 4+  Hip extension    Hip abduction 5 5  Hip adduction 5 5  Hip internal rotation    Hip external rotation    Knee flexion 5 5  Knee extension 5 5  Ankle dorsiflexion 5 5  Ankle plantarflexion 5 5  Ankle inversion    Ankle eversion    (Blank rows =  not tested)  BED MOBILITY: WNL    STAIRS: Level of Assistance: Complete Independence Stair Negotiation Technique: Alternating Pattern  with Bilateral Rails Number of Stairs: 4  Height of Stairs: 6    GAIT: Gait pattern:  forward posture and short step length bilateral Distance walked: 30 ft Assistive device utilized: None Level of assistance: Complete Independence   FUNCTIONAL TESTS:  5 times sit to stand: 13.07 6 minute walk test: test visit 2  Mini Best Test:   PATIENT SURVEYS:  Modified Oswestry 10/50  FOTO 70, risk adjusted goal of 75   [facility] PT Assessment - 09/26/22 0001       Standardized Balance Assessment   Standardized Balance Assessment Mini-BESTest      Mini-BESTest   Sit To Stand Moderate: Comes to stand WITH use of hands on first attempt. (P)     Rise to Toes Normal: Stable for 3 s with maximum height. (P)     Stand on one leg (left) Moderate: < 20 s (P)     Stand on one leg (right) Moderate: < 20 s (P)     Stand on one leg - lowest score 1 (P)     Compensatory Stepping Correction - Forward Moderate: More than one step is required to recover equilibrium (P)     Compensatory Stepping Correction - Backward Moderate: More than one step is required to recover equilibrium (P)     Compensatory Stepping Correction - Left Lateral Moderate: Several steps to recover equilibrium (P)      Compensatory Stepping Correction - Right Lateral Moderate: Several steps to recover equilibrium (P)     Stepping Corredtion Lateral - lowest score 1 (P)     Stance - Feet together, eyes open, firm surface  Normal: 30s (P)     Stance - Feet together, eyes closed, foam surface  Normal: 30s (P)     Incline - Eyes Closed Normal: Stands independently 30s and aligns with gravity (P)     Change in Gait Speed Normal: Significantly changes walkling speed without imbalance (P)     Walk with head turns - Horizontal Moderate: performs head turns with reduction in gait speed. (P)     Walk with pivot turns Normal: Turns with feet close FAST (< 3 steps) with good balance. (P)     Step over obstacles Severe: Unable to step over box OR steps around box (P)     Timed UP & GO with Dual Task Moderate: Dual Task affects either counting OR walking (>10%) when compared to the TUG without Dual Task. (P)    8.46 normal 12.97 s with dual task   Mini-BEST total score 19 (P)              TODAY'S TREATMENT:                                                                                                                              DATE: 09/26/22  PATIENT EDUCATION: Education details: POC Person educated: Patient Education method: Explanation Education comprehension: verbalized understanding  HOME EXERCISE PROGRAM: Seated PWR! Moves handout gone over and provided.   GOALS: Goals reviewed with patient? Yes  SHORT TERM GOALS: Target date: 10/24/2022       Patient will be independent in progressive home exercise program for PD to improve strength/mobility for better functional independence with ADLs. Baseline: No HEP for PD Goal status: INITIAL   LONG TERM GOALS: Target date: 12/19/2022    1.  Patient  will complete five times sit to stand test in < 10 seconds indicating an increased LE strength and improved balance. Baseline: 13.07 Goal status: INITIAL  2.  Patient will increase FOTO score to  equal to or greater than  75   to demonstrate statistically significant improvement in mobility and quality of life.  Baseline: 70 Goal status: INITIAL   3.  Patient will increase Mini best Balance score by > 4 points to demonstrate decreased fall risk during functional activities. Baseline: 19 Goal status: INITIAL   4.   Patient will reduce dual task timed up and go to <11 seconds to reduce fall risk and demonstrate improved transfer/gait ability. Baseline: 12.97 Goal status: INITIAL  5.   Patient will increase six minute walk test distance to >1000 for progression to community ambulator and improve gait ability Baseline: Test visit 2  Goal status: INITIAL  6. Pt will improve modified oswestry score by 5 points or greater in order to indicate improved function in relation to his back pain   Baseline: 10/50  Goal status: INITIAL    ASSESSMENT:  CLINICAL IMPRESSION: Patient is a 72 year old male who presents to physical therapy for evaluation following diagnosis of Parkinson's disease as well as concurrent low back pain he was previously being treated for at different physical therapy clinic.  Patient presents with deficits in his balance posing risk for falls as well as deficits with his posture posing risk for limiting his mobility and increasing his pain.  Patient will benefit from skilled physical therapy in order to improve his balance, improve his signs and symptoms of his Parkinson's disease, and to improve his overall quality of life. OBJECTIVE IMPAIRMENTS: Abnormal gait, decreased balance, decreased endurance, decreased mobility, difficulty walking, and hypomobility.   ACTIVITY LIMITATIONS: carrying, lifting, bending, standing, squatting, stairs, and locomotion level  PARTICIPATION LIMITATIONS: community activity  PERSONAL FACTORS: Age and 1-2 comorbidities: Asthma  are also affecting patient's functional outcome.   REHAB POTENTIAL: Good  CLINICAL DECISION MAKING:  Stable/uncomplicated  EVALUATION COMPLEXITY: Low  PLAN:  PT FREQUENCY: 2x/week  PT DURATION: 12 weeks  PLANNED INTERVENTIONS: Therapeutic exercises, Therapeutic activity, Neuromuscular re-education, Balance training, Gait training, Patient/Family education, Self Care, Joint mobilization, Joint manipulation, Stair training, Dry Needling, Spinal mobilization, Cryotherapy, Moist heat, and Manual therapy  PLAN FOR NEXT SESSION: , begin POC for balance, low back pain, and PD specific impairments.    Norman Herrlich, PT 09/26/2022, 5:05 PM

## 2022-09-26 ENCOUNTER — Ambulatory Visit (INDEPENDENT_AMBULATORY_CARE_PROVIDER_SITE_OTHER): Payer: Medicare Other | Admitting: Family Medicine

## 2022-09-26 ENCOUNTER — Ambulatory Visit: Payer: Medicare Other | Admitting: Physical Therapy

## 2022-09-26 ENCOUNTER — Encounter: Payer: Self-pay | Admitting: Family Medicine

## 2022-09-26 ENCOUNTER — Encounter: Payer: Self-pay | Admitting: Physical Therapy

## 2022-09-26 VITALS — BP 117/76 | HR 57 | Temp 97.8°F | Resp 12 | Ht 74.0 in | Wt 214.0 lb

## 2022-09-26 DIAGNOSIS — E785 Hyperlipidemia, unspecified: Secondary | ICD-10-CM

## 2022-09-26 DIAGNOSIS — M546 Pain in thoracic spine: Secondary | ICD-10-CM

## 2022-09-26 DIAGNOSIS — G8929 Other chronic pain: Secondary | ICD-10-CM | POA: Diagnosis not present

## 2022-09-26 DIAGNOSIS — M5459 Other low back pain: Secondary | ICD-10-CM | POA: Diagnosis not present

## 2022-09-26 DIAGNOSIS — G20B2 Parkinson's disease with dyskinesia, with fluctuations: Secondary | ICD-10-CM

## 2022-09-26 DIAGNOSIS — E039 Hypothyroidism, unspecified: Secondary | ICD-10-CM

## 2022-09-26 DIAGNOSIS — R262 Difficulty in walking, not elsewhere classified: Secondary | ICD-10-CM | POA: Diagnosis not present

## 2022-09-26 DIAGNOSIS — G20A1 Parkinson's disease without dyskinesia, without mention of fluctuations: Secondary | ICD-10-CM | POA: Insufficient documentation

## 2022-09-26 DIAGNOSIS — Z0000 Encounter for general adult medical examination without abnormal findings: Secondary | ICD-10-CM

## 2022-09-26 DIAGNOSIS — R293 Abnormal posture: Secondary | ICD-10-CM | POA: Diagnosis not present

## 2022-09-26 DIAGNOSIS — M545 Low back pain, unspecified: Secondary | ICD-10-CM | POA: Diagnosis not present

## 2022-09-26 DIAGNOSIS — I480 Paroxysmal atrial fibrillation: Secondary | ICD-10-CM

## 2022-09-26 DIAGNOSIS — D6869 Other thrombophilia: Secondary | ICD-10-CM

## 2022-09-26 DIAGNOSIS — K760 Fatty (change of) liver, not elsewhere classified: Secondary | ICD-10-CM

## 2022-09-26 DIAGNOSIS — M791 Myalgia, unspecified site: Secondary | ICD-10-CM

## 2022-09-26 NOTE — Assessment & Plan Note (Signed)
F/b [facility] Doing PT currently

## 2022-09-26 NOTE — Assessment & Plan Note (Signed)
Followed by [facility] cardiology He is in normal sinus rhythm today Having infrequent episodes of A-fib Continue metoprolol at current dose Continue Eliquis for anticoagulation Continue to monitor CBC - reviewed from [facility] chart

## 2022-09-26 NOTE — Assessment & Plan Note (Signed)
Intolerant to statins. 

## 2022-09-26 NOTE — Assessment & Plan Note (Signed)
Reviewed last lipid panel °Not currently on a statin °Recheck FLP and CMP annually °Discussed diet and exercise  °

## 2022-09-26 NOTE — Progress Notes (Signed)
I,Sulibeya S Dimas,acting as a Neurosurgeon for Shirlee Latch, MD.,have documented all relevant documentation on the behalf of Shirlee Latch, MD,as directed by  Shirlee Latch, MD while in the presence of Shirlee Latch, MD.    Annual Wellness Visit     Patient: Daniel [name], Male    DOB: Jan 24, 1951, 72 y.o.   MRN: [id number] Visit Date: 09/26/2022  Today's Provider: Shirlee Latch, MD   Chief Complaint  Patient presents with   Medicare Wellness   Subjective    Daniel [name] is a 72 y.o. male who presents today for his Annual Wellness Visit. He reports consuming a general diet. Home exercise routine includes stretching. He generally feels well. He reports sleeping well. He does not have additional problems to discuss today.   HPI   Medications: Outpatient Medications Prior to Visit  Medication Sig   albuterol (VENTOLIN HFA) 108 (90 Base) MCG/ACT inhaler Inhale 2 puffs into the lungs every 6 (six) hours as needed for wheezing or shortness of breath.   carbidopa-levodopa (PARCOPA) 25-100 MG disintegrating tablet Take 1 tablet by mouth 3 (three) times daily.   Cholecalciferol (VITAMIN D) 125 MCG (5000 UT) CAPS Take 5,000 Units by mouth daily.   docusate sodium (COLACE) 100 MG capsule Take 200 mg by mouth at bedtime.   ELDERBERRY PO Take 200 mg by mouth at bedtime.   ELIQUIS 5 MG TABS tablet Take 5 mg by mouth 2 (two) times daily.   levothyroxine (SYNTHROID) 100 MCG tablet TAKE 1 TABLET BY MOUTH EVERY DAY BEFORE BREAKFAST   metoprolol succinate (TOPROL-XL) 25 MG 24 hr tablet Take 25 mg by mouth daily.   pantoprazole (PROTONIX) 40 MG tablet Take 1 tablet (40 mg total) by mouth daily.   polyethylene glycol (MIRALAX / GLYCOLAX) 17 g packet Take 17 g by mouth daily as needed for moderate constipation or severe constipation.   predniSONE (DELTASONE) 20 MG tablet Take 1 tablet (20 mg total) by mouth daily with breakfast.   traMADol (ULTRAM) 50 MG tablet Take 75 mg by  mouth 2 (two) times daily.   No facility-administered medications prior to visit.    Allergies  Allergen Reactions   Ciprofloxacin Hcl Other (See Comments)    Blisters break out   Levaquin [Levofloxacin In D5w]    Sulfa Antibiotics Other (See Comments)    unknown    Patient Care Team: Erasmo Downer, MD as PCP - General (Family Medicine) Gaspar Cola, [facility] (Pharmacist)  Review of Systems  Gastrointestinal:  Positive for constipation.  All other systems reviewed and are negative.   Last CBC Lab Results  Component Value Date   WBC 7.6 03/28/2022   HGB 16.3 03/28/2022   HCT 46.6 03/28/2022   MCV 83 03/28/2022   MCH 28.9 03/28/2022   RDW 12.3 03/28/2022   PLT 275 03/28/2022   Last metabolic panel Lab Results  Component Value Date   GLUCOSE 95 03/28/2022   NA 143 03/28/2022   K 4.1 03/28/2022   CL 103 03/28/2022   CO2 23 03/28/2022   BUN 18 03/28/2022   CREATININE 1.07 03/28/2022   EGFR 74 03/28/2022   CALCIUM 9.3 03/28/2022   PHOS 3.4 11/16/2021   PROT 6.6 03/28/2022   ALBUMIN 4.3 03/28/2022   LABGLOB 2.3 03/28/2022   AGRATIO 1.9 03/28/2022   BILITOT 0.7 03/28/2022   ALKPHOS 69 03/28/2022   AST 11 03/28/2022   ALT 7 03/28/2022   Last lipids Lab Results  Component Value Date  CHOL 203 (H) 03/28/2022   HDL 73 03/28/2022   LDLCALC 116 (H) 03/28/2022   TRIG 78 03/28/2022   CHOLHDL 2.8 03/28/2022   Last hemoglobin A1c Lab Results  Component Value Date   HGBA1C 5.4 08/24/2021   Last thyroid functions Lab Results  Component Value Date   TSH 1.180 03/28/2022   T4TOTAL 7.3 05/27/2020   Last vitamin D Lab Results  Component Value Date   VD25OH 27.0 (L) 03/28/2022   Last vitamin B12 and Folate Lab Results  Component Value Date   VITAMINB12 363 05/17/2021        Objective    Vitals: BP 117/76 (BP Location: Left Arm, Patient Position: Sitting, Cuff Size: Large)   Pulse (!) 57   Temp 97.8 F (36.6 C) (Temporal)   Resp 12   Ht 6'  2" (1.88 m)   Wt 214 lb (97.1 kg)   BMI 27.48 kg/m  BP Readings from Last 3 Encounters:  09/26/22 117/76  07/03/22 123/78  06/05/22 127/79   Wt Readings from Last 3 Encounters:  09/26/22 214 lb (97.1 kg)  07/03/22 200 lb (90.7 kg)  06/05/22 212 lb (96.2 kg)      Physical Exam Vitals reviewed.  Constitutional:      General: He is not in acute distress.    Appearance: Normal appearance. He is well-developed. He is not diaphoretic.  HENT:     Head: Normocephalic and atraumatic.     Right Ear: Tympanic membrane, ear canal and external ear normal.     Left Ear: Tympanic membrane, ear canal and external ear normal.     Nose: Nose normal.     Mouth/Throat:     Mouth: Mucous membranes are moist.     Pharynx: Oropharynx is clear. No oropharyngeal exudate.  Eyes:     General: No scleral icterus.    Conjunctiva/sclera: Conjunctivae normal.     Pupils: Pupils are equal, round, and reactive to light.  Neck:     Thyroid: No thyromegaly.  Cardiovascular:     Rate and Rhythm: Normal rate and regular rhythm.     Pulses: Normal pulses.     Heart sounds: Normal heart sounds. No murmur heard. Pulmonary:     Effort: Pulmonary effort is normal. No respiratory distress.     Breath sounds: Normal breath sounds. No wheezing or rales.  Abdominal:     General: There is no distension.     Palpations: Abdomen is soft.     Tenderness: There is no abdominal tenderness.  Musculoskeletal:        General: No deformity.     Cervical back: Neck supple.     Right lower leg: No edema.     Left lower leg: No edema.  Lymphadenopathy:     Cervical: No cervical adenopathy.  Skin:    General: Skin is warm and dry.     Findings: No rash.  Neurological:     Mental Status: He is alert. Mental status is at baseline.  Psychiatric:        Mood and Affect: Mood normal.        Behavior: Behavior normal.        Thought Content: Thought content normal.      Most recent functional status assessment:     09/26/2022   10:45 AM  In your present state of health, do you have any difficulty performing the following activities:  Hearing? 1  Vision? 0  Difficulty concentrating or making decisions? 0  Walking  or climbing stairs? 0  Dressing or bathing? 0  Doing errands, shopping? 0   Most recent fall risk assessment:    09/26/2022   10:44 AM  Fall Risk   Falls in the past year? 0  Number falls in past yr: 0  Injury with Fall? 0  Risk for fall due to : No Fall Risks  Follow up Falls evaluation completed    Most recent depression screenings:    09/26/2022   10:44 AM 03/28/2022   10:25 AM  PHQ 2/9 Scores  PHQ - 2 Score 0 0  PHQ- 9 Score 0 0   Most recent cognitive screening:    05/27/2019    9:55 AM  6CIT Screen  What Year? 0 points  What month? 0 points  What time? 0 points  Count back from 20 0 points  Months in reverse 0 points  Repeat phrase 0 points  Total Score 0 points   Most recent Audit-C alcohol use screening    09/26/2022   10:45 AM  Alcohol Use Disorder Test (AUDIT)  1. How often do you have a drink containing alcohol? 3  2. How many drinks containing alcohol do you have on a typical day when you are drinking? 0  3. How often do you have six or more drinks on one occasion? 0  AUDIT-C Score 3   A score of 3 or more in women, and 4 or more in men indicates increased risk for alcohol abuse, EXCEPT if all of the points are from question 1   No results found for any visits on 09/26/22.  Assessment & Plan     Annual wellness visit done today including the all of the following: Reviewed patient's Family Medical History Reviewed and updated list of patient's medical providers Assessment of cognitive impairment was done Assessed patient's functional ability Established a written schedule for health screening services Health Risk Assessent Completed and Reviewed  Exercise Activities and Dietary recommendations  Goals   None     Immunization History   Administered Date(s) Administered   COVID-19, mRNA, vaccine(Comirnaty)12 years and older 05/16/2022   Fluad Quad(high Dose 65+) 02/05/2019, 02/18/2020, 03/23/2021, 02/14/2022   Influenza, High Dose Seasonal PF 02/25/2015, 02/15/2017, 02/13/2018   Influenza, Seasonal, Injecte, Preservative Fre 06/26/2016   PFIZER(Purple Top)SARS-COV-2 Vaccination 07/09/2019, 07/30/2019, 03/23/2020   Pneumococcal Conjugate-13 01/27/2015   Pneumococcal Polysaccharide-23 04/18/2018   Td 04/06/2020   Tdap 01/23/2007   Zoster Recombinat (Shingrix) 06/12/2018, 01/29/2020   Zoster, Live 05/31/2010    Health Maintenance  Topic Date Due   COVID-19 Vaccine (5 - 2023-24 season) 07/11/2022   INFLUENZA VACCINE  12/28/2022   Medicare Annual Wellness (AWV)  09/26/2023   COLONOSCOPY (Pts 45-18yrs Insurance coverage will need to be confirmed)  12/14/2026   DTaP/Tdap/Td (3 - Td or Tdap) 04/06/2030   Pneumonia Vaccine 4+ Years old  Completed   Hepatitis C Screening  Completed   Zoster Vaccines- Shingrix  Completed   HPV VACCINES  Aged Out     Discussed health benefits of physical activity, and encouraged him to engage in regular exercise appropriate for his age and condition.    Problem List Items Addressed This Visit       Cardiovascular and Mediastinum   Paroxysmal atrial fibrillation (HCC)    Followed by [facility] cardiology He is in normal sinus rhythm today Having infrequent episodes of A-fib Continue metoprolol at current dose Continue Eliquis for anticoagulation Continue to monitor CBC - reviewed from [facility] chart  Digestive   Fatty liver    Chronic and stable Reviewed last labs        Endocrine   Hypothyroidism    Previously well controlled - reviewed labs from [facility] chart Continue Synthroid at current dose         Nervous and Auditory   Parkinson's disease    F/b [facility] Doing PT currently        Hematopoietic and Hemostatic   Acquired thrombophilia (HCC)    A-fib on  anticoagulation        Other   Myalgia due to statin    Intolerant to statins      Hyperlipidemia    Reviewed last lipid panel Not currently on a statin Recheck FLP and CMP annually Discussed diet and exercise       Other Visit Diagnoses     Encounter for annual wellness visit (AWV) in Medicare patient    -  Primary   Encounter for annual physical exam            Return in about 6 months (around 03/28/2023) for chronic disease f/u.     I, Shirlee Latch, MD, have reviewed all documentation for this visit. The documentation on 09/26/22 for the exam, diagnosis, procedures, and orders are all accurate and complete.   Wei Newbrough, Marzella Schlein, MD, MPH [facility]

## 2022-09-26 NOTE — Assessment & Plan Note (Signed)
A-fib on anticoagulation 

## 2022-09-26 NOTE — Assessment & Plan Note (Signed)
Previously well controlled - reviewed labs from [facility] chart Continue Synthroid at current dose

## 2022-09-26 NOTE — Assessment & Plan Note (Signed)
Chronic and stable Reviewed last labs

## 2022-09-28 ENCOUNTER — Ambulatory Visit: Payer: Medicare Other | Attending: Neurology

## 2022-09-28 DIAGNOSIS — M6281 Muscle weakness (generalized): Secondary | ICD-10-CM | POA: Diagnosis not present

## 2022-09-28 DIAGNOSIS — R2681 Unsteadiness on feet: Secondary | ICD-10-CM | POA: Diagnosis not present

## 2022-09-28 DIAGNOSIS — R278 Other lack of coordination: Secondary | ICD-10-CM | POA: Insufficient documentation

## 2022-09-28 DIAGNOSIS — R293 Abnormal posture: Secondary | ICD-10-CM

## 2022-09-28 DIAGNOSIS — M5459 Other low back pain: Secondary | ICD-10-CM

## 2022-09-28 DIAGNOSIS — R262 Difficulty in walking, not elsewhere classified: Secondary | ICD-10-CM | POA: Diagnosis not present

## 2022-09-28 DIAGNOSIS — M545 Low back pain, unspecified: Secondary | ICD-10-CM | POA: Diagnosis not present

## 2022-09-28 DIAGNOSIS — G8929 Other chronic pain: Secondary | ICD-10-CM | POA: Insufficient documentation

## 2022-09-28 NOTE — Therapy (Signed)
OUTPATIENT PHYSICAL THERAPY NEURO TREATMENT NOTE   Patient Name: Daniel [name] MRN: [id number] DOB:1950/08/26, 72 y.o., male Today's Date: 09/28/2022   PCP:    Denzil Magnuson   REFERRING PROVIDER: Lonell Face, MD   END OF SESSION:  PT End of Session - 09/28/22 1544     Visit Number 2    Number of Visits 24    Date for PT Re-Evaluation 12/19/22    Progress Note Due on Visit 10    PT Start Time 1557    PT Stop Time 1640    PT Time Calculation (min) 43 min    Equipment Utilized During Treatment Gait belt    Activity Tolerance Patient tolerated treatment well    Behavior During Therapy WFL for tasks assessed/performed              Past Medical History:  Diagnosis Date   Actinic keratosis    Asthma    Eczema    Hx of dysplastic nevus 06/06/2006   Mid lower back, slight atypia   Hypothyroidism    Seasonal allergies    Thyroid disease    Past Surgical History:  Procedure Laterality Date   CATARACT EXTRACTION     COLON SURGERY     COLONOSCOPY WITH PROPOFOL N/A 11/18/2014   Procedure: COLONOSCOPY WITH PROPOFOL;  Surgeon: Scot Jun, MD;  Location: [facility] ENDOSCOPY;  Service: Endoscopy;  Laterality: N/A;   COLONOSCOPY WITH PROPOFOL N/A 12/13/2021   Procedure: COLONOSCOPY WITH PROPOFOL;  Surgeon: Regis Bill, MD;  Location: [facility] ENDOSCOPY;  Service: Endoscopy;  Laterality: N/A;   EYE SURGERY     KNEE SURGERY     NASAL SEPTUM SURGERY     Patient Active Problem List   Diagnosis Date Noted   Parkinson's disease 09/26/2022   Chronic idiopathic constipation 06/05/2022   Left lower quadrant abdominal pain 06/05/2022   Paroxysmal atrial fibrillation (HCC) 03/28/2022   Idiopathic hypotension 03/28/2022   Spondylolysis, lumbar region 03/28/2022   Avitaminosis D 03/28/2022   Myalgia due to statin 03/28/2022   Acquired thrombophilia (HCC) 03/28/2022   Hyperlipidemia 03/28/2022   Abnormal gait 03/28/2022   Mild intermittent asthma without  complication 03/28/2022   Zenker's (hypopharyngeal) diverticulum 03/22/2018   Gastroesophageal reflux disease 03/22/2018   Fatty liver 10/09/2017   BPH (benign prostatic hyperplasia) 11/21/2014   Hypothyroidism 11/21/2014   Inguinal hernia 11/21/2014   OA (osteoarthritis) 11/21/2014   H/O adenomatous polyp of colon 10/14/2014    ONSET DATE: 04/28/2022 for PD, back pain began around 09/10/21   REFERRING DIAG:  G20.A1 (ICD-10-CM) - Parkinson's disease  M54.9 (ICD-10-CM) - Back pain    THERAPY DIAG:  Unsteadiness on feet  Abnormal posture  Other low back pain  Rationale for Evaluation and Treatment: Rehabilitation  SUBJECTIVE:  SUBJECTIVE STATEMENT: Pt reports no pain currently. Pt reports no stumbles/falls and no new/current concerns. Has back pain in the morning that goes away after a couple hours. Pt reports he has difficulty walking on uneven surfaces Pt accompanied by: self  PERTINENT HISTORY: Per [name] PT from 09/07/22 PT discharge from Low back treatment for focussed on PD related impairments at our clinic "Patient has attended 40 physical therapy treatment sessions since starting current episode of care on 12/20/2021. Patient at first struggled to make progress towards goals and has struggled at times with consistent attendance. However, over the past few weeks he has been improving with multi-discipline interventions including medications and increasing overall activity. It was also recently confirmed he has Parkinson's that is likely contributing to some of his abnormal posture and difficulty with full ROM during movements. Patient is being discharged today due to improvement in condition and so he can start PT with focus on rehabilitation techniques for Parkinson's. Patient was provided  with a long term HEP to help him with long term management of low back pain."  PAIN:  Are you having pain? Yes: NPRS scale: 0/10 Pain location: low back  Pain description: tight and achy  Aggravating factors: when laying down or getting up from sleep  Relieving factors: PT exercises, movement   PRECAUTIONS: None  WEIGHT BEARING RESTRICTIONS: No  FALLS: Has patient fallen in last 6 months? No  LIVING ENVIRONMENT: Lives with: lives alone Lives in: House/apartment Stairs: Yes: External: 5 steps; on left going up Has following equipment at home: None  PLOF: Independent  PATIENT GOALS: Improve his low back pain, improve posture, improve walking.   OBJECTIVE:   DIAGNOSTIC FINDINGS: MRI of L spine 07/12/21: Degenerative disc disease throughout the lumbar region with loss of disc height. No apparent compressive stenosis of the canal. Discogenic endplate marrow changes without active edema at L2-3, L3-4 and L4-5, which could correlate with regional axial back pain.   Left lateral recess and foraminal narrowing at L2-3, left foraminal narrowing at L3-4, and bilateral lateral recess and foraminal narrowing at L4-5. None of the stenoses are severe or are seen to result in definite neural compression.   Shallow left posterolateral disc herniation at L1-2 with caudal migration of a tiny amount of disc material to the left of midline. This does not appear to cause any significant compressive effect upon the neural structures.  COGNITION: Overall cognitive status: Within functional limits for tasks assessed   SENSATION: WFL   POSTURE: rounded shoulders, forward head, and increased thoracic kyphosis  LOWER EXTREMITY ROM:   WNL LOWER EXTREMITY MMT:    MMT Right Eval Left Eval  Hip flexion 4+ 4+  Hip extension    Hip abduction 5 5  Hip adduction 5 5  Hip internal rotation    Hip external rotation    Knee flexion 5 5  Knee extension 5 5  Ankle dorsiflexion 5 5  Ankle  plantarflexion 5 5  Ankle inversion    Ankle eversion    (Blank rows = not tested)  BED MOBILITY: WNL    STAIRS: Level of Assistance: Complete Independence Stair Negotiation Technique: Alternating Pattern  with Bilateral Rails Number of Stairs: 4  Height of Stairs: 6    GAIT: Gait pattern:  forward posture and short step length bilateral Distance walked: 30 ft Assistive device utilized: None Level of assistance: Complete Independence   FUNCTIONAL TESTS:  5 times sit to stand: 13.07 6 minute walk test: 1414 ft  Mini  Best Test:   PATIENT SURVEYS:  Modified Oswestry 10/50  FOTO 70, risk adjusted goal of 75     TODAY'S TREATMENT:                                                                                                                              DATE: 09/28/22   TE:  completed, pt ambulates 1414 ft Wall slides 10x to promote upright posture Chin tucks standing against wall 10x for postural strengthening STS with bilat shoulder abduction 10x  Seated thoracic ext over chair 2x10    NMR: gait belt donned and CGA provided unless specified otherwise. Performed at support surface SLB 2x30-45 sec each LE. Difficult for pt, intermittent toe-touch/UE support SLB progression - LE on soccer CW/CC 10x each LE, intermittent UE support Orange hurdle fwd/bckwd and LTL x multiple reps of each. Pt rates easy  WBOS on airex basketball shots for dynamic balance x 1 round. Pt then performs intervention in semi-tandem on airex x 1 round in each LE position. Decreased postural stability requiring up to min a with WBOS, but minimal decrease in postural stability and CGA with tandem stance EC NBOS on airex 2x30 sec. Difficult  Obstacle course: pt steps on and over airex, over half bolster, and from firm surface to soft mat with weaving around 3 cones 4x through no LOB       PATIENT EDUCATION: Education details: Pt educated throughout session about proper posture and  technique with exercises. Improved exercise technique, movement at target joints, use of target muscles after min to mod verbal, visual, tactile cues.  Person educated: Patient Education method: Explanation Education comprehension: verbalized understanding  HOME EXERCISE PROGRAM: Seated PWR! Moves handout gone over and provided.   GOALS: Goals reviewed with patient? Yes  SHORT TERM GOALS: Target date: 10/24/2022       Patient will be independent in progressive home exercise program for PD to improve strength/mobility for better functional independence with ADLs. Baseline: No HEP for PD; 09/28/2022 per previous note pt currently perform PWR interventions as part of HEP Goal status: INITIAL   LONG TERM GOALS: Target date: 12/19/2022    1.  Patient  will complete five times sit to stand test in < 10 seconds indicating an increased LE strength and improved balance. Baseline: 13.07 Goal status: INITIAL  2.  Patient will increase FOTO score to equal to or greater than  75   to demonstrate statistically significant improvement in mobility and quality of life.  Baseline: 70 Goal status: INITIAL   3.  Patient will increase Mini best Balance score by > 4 points to demonstrate decreased fall risk during functional activities. Baseline: 19 Goal status: INITIAL   4.   Patient will reduce dual task timed up and go to <11 seconds to reduce fall risk and demonstrate improved transfer/gait ability. Baseline: 12.97 Goal status: INITIAL  5.   Patient will increase six minute walk test distance to >  1000 for progression to community ambulator and improve gait ability Baseline: Test visit 2 09/28/2022: 1414 ft  Goal status: REVISED/MET 09/28/2022  6. Pt will improve modified oswestry score by 5 points or greater in order to indicate improved function in relation to his back pain   Baseline: 10/50  Goal status: INITIAL    ASSESSMENT:  CLINICAL IMPRESSION: Initiated balance training with  continued additional focus on promoting improved posture. Pt most challenged with SLB activities and WBOS on airex with dual motor task (basketball), where he required up to min a to correct for decreased balance. Pt reports difficulty with compliant surfaces, plan to attempt ambulation outside future visit. Patient will benefit from skilled physical therapy in order to improve his balance, improve his signs and symptoms of his Parkinson's disease, and to improve his overall quality of life. OBJECTIVE IMPAIRMENTS: Abnormal gait, decreased balance, decreased endurance, decreased mobility, difficulty walking, and hypomobility.   ACTIVITY LIMITATIONS: carrying, lifting, bending, standing, squatting, stairs, and locomotion level  PARTICIPATION LIMITATIONS: community activity  PERSONAL FACTORS: Age and 1-2 comorbidities: Asthma  are also affecting patient's functional outcome.   REHAB POTENTIAL: Good  CLINICAL DECISION MAKING: Stable/uncomplicated  EVALUATION COMPLEXITY: Low  PLAN:  PT FREQUENCY: 2x/week  PT DURATION: 12 weeks  PLANNED INTERVENTIONS: Therapeutic exercises, Therapeutic activity, Neuromuscular re-education, Balance training, Gait training, Patient/Family education, Self Care, Joint mobilization, Joint manipulation, Stair training, Dry Needling, Spinal mobilization, Cryotherapy, Moist heat, and Manual therapy  PLAN FOR NEXT SESSION:  balance, low back pain, and PD specific impairments.    Baird Kay, PT 09/28/2022, 5:45 PM

## 2022-10-02 ENCOUNTER — Ambulatory Visit: Payer: Medicare Other

## 2022-10-02 DIAGNOSIS — R293 Abnormal posture: Secondary | ICD-10-CM | POA: Diagnosis not present

## 2022-10-02 DIAGNOSIS — M5459 Other low back pain: Secondary | ICD-10-CM

## 2022-10-02 DIAGNOSIS — M545 Low back pain, unspecified: Secondary | ICD-10-CM | POA: Diagnosis not present

## 2022-10-02 DIAGNOSIS — R2681 Unsteadiness on feet: Secondary | ICD-10-CM | POA: Diagnosis not present

## 2022-10-02 DIAGNOSIS — R278 Other lack of coordination: Secondary | ICD-10-CM | POA: Diagnosis not present

## 2022-10-02 DIAGNOSIS — R262 Difficulty in walking, not elsewhere classified: Secondary | ICD-10-CM | POA: Diagnosis not present

## 2022-10-02 DIAGNOSIS — M6281 Muscle weakness (generalized): Secondary | ICD-10-CM | POA: Diagnosis not present

## 2022-10-02 DIAGNOSIS — G8929 Other chronic pain: Secondary | ICD-10-CM | POA: Diagnosis not present

## 2022-10-02 NOTE — Therapy (Signed)
OUTPATIENT PHYSICAL THERAPY NEURO TREATMENT NOTE   Patient Name: Daniel [name] MRN: [id number] DOB:03/10/1951, 72 y.o., male Today's Date: 10/02/2022   PCP:    Denzil Magnuson   REFERRING PROVIDER: Lonell Face, MD   END OF SESSION:  PT End of Session - 10/02/22 1502     Visit Number 3    Number of Visits 24    Date for PT Re-Evaluation 12/19/22    Progress Note Due on Visit 10    PT Start Time 1507    PT Stop Time 1555    PT Time Calculation (min) 48 min    Equipment Utilized During Treatment Gait belt    Activity Tolerance Patient tolerated treatment well    Behavior During Therapy WFL for tasks assessed/performed              Past Medical History:  Diagnosis Date   Actinic keratosis    Asthma    Eczema    Hx of dysplastic nevus 06/06/2006   Mid lower back, slight atypia   Hypothyroidism    Seasonal allergies    Thyroid disease    Past Surgical History:  Procedure Laterality Date   CATARACT EXTRACTION     COLON SURGERY     COLONOSCOPY WITH PROPOFOL N/A 11/18/2014   Procedure: COLONOSCOPY WITH PROPOFOL;  Surgeon: Scot Jun, MD;  Location: [facility] ENDOSCOPY;  Service: Endoscopy;  Laterality: N/A;   COLONOSCOPY WITH PROPOFOL N/A 12/13/2021   Procedure: COLONOSCOPY WITH PROPOFOL;  Surgeon: Regis Bill, MD;  Location: [facility] ENDOSCOPY;  Service: Endoscopy;  Laterality: N/A;   EYE SURGERY     KNEE SURGERY     NASAL SEPTUM SURGERY     Patient Active Problem List   Diagnosis Date Noted   Parkinson's disease 09/26/2022   Chronic idiopathic constipation 06/05/2022   Left lower quadrant abdominal pain 06/05/2022   Paroxysmal atrial fibrillation (HCC) 03/28/2022   Idiopathic hypotension 03/28/2022   Spondylolysis, lumbar region 03/28/2022   Avitaminosis D 03/28/2022   Myalgia due to statin 03/28/2022   Acquired thrombophilia (HCC) 03/28/2022   Hyperlipidemia 03/28/2022   Abnormal gait 03/28/2022   Mild intermittent asthma without  complication 03/28/2022   Zenker's (hypopharyngeal) diverticulum 03/22/2018   Gastroesophageal reflux disease 03/22/2018   Fatty liver 10/09/2017   BPH (benign prostatic hyperplasia) 11/21/2014   Hypothyroidism 11/21/2014   Inguinal hernia 11/21/2014   OA (osteoarthritis) 11/21/2014   H/O adenomatous polyp of colon 10/14/2014    ONSET DATE: 04/28/2022 for PD, back pain began around 09/10/21   REFERRING DIAG:  G20.A1 (ICD-10-CM) - Parkinson's disease  M54.9 (ICD-10-CM) - Back pain    THERAPY DIAG:  Unsteadiness on feet  Abnormal posture  Other low back pain  Rationale for Evaluation and Treatment: Rehabilitation  SUBJECTIVE:  SUBJECTIVE STATEMENT: Pt reports a twinge of back pain. 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 He reports no stumbles/trips/falls. Pt has more difficulty getting dressed (putting on pants) than he used to. He says the strength in his arms in not what it use to be.    Pt accompanied by: self  PERTINENT HISTORY: Per [name] PT from 09/07/22 PT discharge from Low back treatment for focussed on PD related impairments at our clinic "Patient has attended 40 physical therapy treatment sessions since starting current episode of care on 12/20/2021. Patient at first struggled to make progress towards goals and has struggled at times with consistent attendance. However, over the past few weeks he has been improving with multi-discipline interventions including medications and increasing overall activity. It was also recently confirmed he has Parkinson's that is likely contributing to some of his abnormal posture and difficulty with full ROM during movements. Patient is being discharged today due to improvement in condition and  so he can start PT with focus on rehabilitation techniques for Parkinson's. Patient was provided with a long term HEP to help him with long term management of low back pain."  PAIN:  Are you having pain? Yes: NPRS scale: 0/10 Pain location: low back  Pain description: tight and achy  Aggravating factors: when laying down or getting up from sleep  Relieving factors: PT exercises, movement   PRECAUTIONS: None  WEIGHT BEARING RESTRICTIONS: No  FALLS: Has patient fallen in last 6 months? No  LIVING ENVIRONMENT: Lives with: lives alone Lives in: House/apartment Stairs: Yes: External: 5 steps; on left going up Has following equipment at home: None  PLOF: Independent  PATIENT GOALS: Improve his low back pain, improve posture, improve walking.   OBJECTIVE:   DIAGNOSTIC FINDINGS: MRI of L spine 07/12/21: Degenerative disc disease throughout the lumbar region with loss of disc height. No apparent compressive stenosis of the canal. Discogenic endplate marrow changes without active edema at L2-3, L3-4 and L4-5, which could correlate with regional axial back pain.   Left lateral recess and foraminal narrowing at L2-3, left foraminal narrowing at L3-4, and bilateral lateral recess and foraminal narrowing at L4-5. None of the stenoses are severe or are seen to result in definite neural compression.   Shallow left posterolateral disc herniation at L1-2 with caudal migration of a tiny amount of disc material to the left of midline. This does not appear to cause any significant compressive effect upon the neural structures.  COGNITION: Overall cognitive status: Within functional limits for tasks assessed   SENSATION: WFL   POSTURE: rounded shoulders, forward head, and increased thoracic kyphosis  LOWER EXTREMITY ROM:   WNL LOWER EXTREMITY MMT:    MMT Right Eval Left Eval  Hip flexion 4+ 4+  Hip extension    Hip abduction 5 5  Hip adduction 5 5  Hip internal rotation    Hip  external rotation    Knee flexion 5 5  Knee extension 5 5  Ankle dorsiflexion 5 5  Ankle plantarflexion 5 5  Ankle inversion    Ankle eversion    (Blank rows = not tested)  BED MOBILITY: WNL    STAIRS: Level of Assistance: Complete Independence Stair Negotiation Technique: Alternating Pattern  with Bilateral Rails Number of Stairs: 4  Height of Stairs: 6    GAIT: Gait pattern:  forward posture and short step length bilateral Distance walked: 30 ft Assistive  device utilized: None Level of assistance: Complete Independence   FUNCTIONAL TESTS:  5 times sit to stand: 13.07 6 minute walk test: 1414 ft  Mini Best Test:   PATIENT SURVEYS:  Modified Oswestry 10/50  FOTO 70, risk adjusted goal of 75     TODAY'S TREATMENT:                                                                                                                              DATE: 10/02/22   NMR: gait belt donned and CGA provided unless specified otherwise. Performed at support surface Large amplitude STS with trunk twist and clap 10x. Pt rates easy, however, exhibits some difficulty with sequencing/upright posture Seated twist with unilateral UE reach10x with 5 sec hold each UE. Cuing for upright posture, rates easy, continues to exhibit some difficulty with getting into more upright posture Standing rocking with alt UE reach instatic  lunge position with 1.5# weights donned each UE 2x15 each side. CGA-min a due to unsteadiness  LTL step out with trunk twist 4 reps completed to R - discontinued due to knee pain  Standing trunk twist with 1.5# weights on wrists 10x each side, cuing for posture throughout SLB 3x30 sec each LE - intermittent UE support/toe-touch  One foot on floor, one on 6" step with vertical and horizontal head turns 10x each. Vertical rated more difficult for pt KoreBalance: Tux racer 4x attempted with both BUE and UUE support, requires BUE support at this time due to difficulty.  Performed to promote anticipatory postural control, ankle and hip strategies Gait with vertical head turns over 10 meters x 4 rounds Soccer ball  SLB progression CW/CC 10x for each, then performed again with dual cog task - intermittent UE support Large amplitude STS with twist and clap 10x   Pt pain-free with all interventions with exception of LTL step out with trunk twist, which was discontinued  PATIENT EDUCATION: Education details: Pt educated throughout session about proper posture and technique with exercises. Improved exercise technique, movement at target joints, use of target muscles after min to mod verbal, visual, tactile cues.  Person educated: Patient Education method: Explanation Education comprehension: verbalized understanding  HOME EXERCISE PROGRAM: Seated PWR! Moves handout gone over and provided.   GOALS: Goals reviewed with patient? Yes  SHORT TERM GOALS: Target date: 10/24/2022       Patient will be independent in progressive home exercise program for PD to improve strength/mobility for better functional independence with ADLs. Baseline: No HEP for PD; 09/28/2022 per previous note pt currently perform PWR interventions as part of HEP Goal status: INITIAL   LONG TERM GOALS: Target date: 12/19/2022    1.  Patient  will complete five times sit to stand test in < 10 seconds indicating an increased LE strength and improved balance. Baseline: 13.07 Goal status: INITIAL  2.  Patient will increase FOTO score to equal to or greater than  75   to  demonstrate statistically significant improvement in mobility and quality of life.  Baseline: 70 Goal status: INITIAL   3.  Patient will increase Mini best Balance score by > 4 points to demonstrate decreased fall risk during functional activities. Baseline: 19 Goal status: INITIAL   4.   Patient will reduce dual task timed up and go to <11 seconds to reduce fall risk and demonstrate improved transfer/gait  ability. Baseline: 12.97 Goal status: INITIAL  5.   Patient will increase six minute walk test distance to >1000 for progression to community ambulator and improve gait ability Baseline: Test visit 2 09/28/2022: 1414 ft  Goal status: REVISED/MET 09/28/2022  6. Pt will improve modified oswestry score by 5 points or greater in order to indicate improved function in relation to his back pain   Baseline: 10/50  Goal status: INITIAL    ASSESSMENT:  CLINICAL IMPRESSION: Pt rates majority of interventions this session as easy, however, does exhibit balance difficulty with more dynamic activities, particularly those involving UE coordination or dual task. Pt able to correct for improved upright posture throughout with cuing, but has difficulty sustaining this/self-correcting.  The patient will benefit from skilled physical therapy in order to improve his balance, improve his signs and symptoms of his Parkinson's disease, and to improve his overall quality of life. OBJECTIVE IMPAIRMENTS: Abnormal gait, decreased balance, decreased endurance, decreased mobility, difficulty walking, and hypomobility.   ACTIVITY LIMITATIONS: carrying, lifting, bending, standing, squatting, stairs, and locomotion level  PARTICIPATION LIMITATIONS: community activity  PERSONAL FACTORS: Age and 1-2 comorbidities: Asthma  are also affecting patient's functional outcome.   REHAB POTENTIAL: Good  CLINICAL DECISION MAKING: Stable/uncomplicated  EVALUATION COMPLEXITY: Low  PLAN:  PT FREQUENCY: 2x/week  PT DURATION: 12 weeks  PLANNED INTERVENTIONS: Therapeutic exercises, Therapeutic activity, Neuromuscular re-education, Balance training, Gait training, Patient/Family education, Self Care, Joint mobilization, Joint manipulation, Stair training, Dry Needling, Spinal mobilization, Cryotherapy, Moist heat, and Manual therapy  PLAN FOR NEXT SESSION:  balance, low back pain, and PD specific impairments.    Baird Kay,  PT 10/02/2022, 4:13 PM

## 2022-10-05 ENCOUNTER — Ambulatory Visit: Payer: Medicare Other

## 2022-10-05 DIAGNOSIS — G8929 Other chronic pain: Secondary | ICD-10-CM

## 2022-10-05 DIAGNOSIS — R2681 Unsteadiness on feet: Secondary | ICD-10-CM

## 2022-10-05 DIAGNOSIS — R293 Abnormal posture: Secondary | ICD-10-CM

## 2022-10-05 DIAGNOSIS — M6281 Muscle weakness (generalized): Secondary | ICD-10-CM | POA: Diagnosis not present

## 2022-10-05 DIAGNOSIS — R262 Difficulty in walking, not elsewhere classified: Secondary | ICD-10-CM | POA: Diagnosis not present

## 2022-10-05 DIAGNOSIS — R278 Other lack of coordination: Secondary | ICD-10-CM | POA: Diagnosis not present

## 2022-10-05 DIAGNOSIS — M5459 Other low back pain: Secondary | ICD-10-CM | POA: Diagnosis not present

## 2022-10-05 DIAGNOSIS — M545 Low back pain, unspecified: Secondary | ICD-10-CM | POA: Diagnosis not present

## 2022-10-05 NOTE — Therapy (Signed)
OUTPATIENT PHYSICAL THERAPY NEURO TREATMENT NOTE   Patient Name: Daniel [name] MRN: [id number] DOB:[birth year], 72 y.o., male Today's Date: 10/05/2022   PCP:    Denzil Magnuson   REFERRING PROVIDER: Lonell Face, MD   END OF SESSION:  PT End of Session - 10/05/22 1757     Visit Number 4    Number of Visits 24    Date for PT Re-Evaluation 12/19/22    Progress Note Due on Visit 10    PT Start Time 1517    PT Stop Time 1558    PT Time Calculation (min) 41 min    Equipment Utilized During Treatment Gait belt    Activity Tolerance Patient tolerated treatment well    Behavior During Therapy WFL for tasks assessed/performed              Past Medical History:  Diagnosis Date   Actinic keratosis    Asthma    Eczema    Hx of dysplastic nevus 06/06/2006   Mid lower back, slight atypia   Hypothyroidism    Seasonal allergies    Thyroid disease    Past Surgical History:  Procedure Laterality Date   CATARACT EXTRACTION     COLON SURGERY     COLONOSCOPY WITH PROPOFOL N/A 11/18/2014   Procedure: COLONOSCOPY WITH PROPOFOL;  Surgeon: Scot Jun, MD;  Location: [facility] ENDOSCOPY;  Service: Endoscopy;  Laterality: N/A;   COLONOSCOPY WITH PROPOFOL N/A 12/13/2021   Procedure: COLONOSCOPY WITH PROPOFOL;  Surgeon: Regis Bill, MD;  Location: [facility] ENDOSCOPY;  Service: Endoscopy;  Laterality: N/A;   EYE SURGERY     KNEE SURGERY     NASAL SEPTUM SURGERY     Patient Active Problem List   Diagnosis Date Noted   Parkinson's disease 09/26/2022   Chronic idiopathic constipation 06/05/2022   Left lower quadrant abdominal pain 06/05/2022   Paroxysmal atrial fibrillation (HCC) 03/28/2022   Idiopathic hypotension 03/28/2022   Spondylolysis, lumbar region 03/28/2022   Avitaminosis D 03/28/2022   Myalgia due to statin 03/28/2022   Acquired thrombophilia (HCC) 03/28/2022   Hyperlipidemia 03/28/2022   Abnormal gait 03/28/2022   Mild intermittent asthma without  complication 03/28/2022   Zenker's (hypopharyngeal) diverticulum 03/22/2018   Gastroesophageal reflux disease 03/22/2018   Fatty liver 10/09/2017   BPH (benign prostatic hyperplasia) 11/21/2014   Hypothyroidism 11/21/2014   Inguinal hernia 11/21/2014   OA (osteoarthritis) 11/21/2014   H/O adenomatous polyp of colon 10/14/2014    ONSET DATE: 04/28/2022 for PD, back pain began around 09/10/21   REFERRING DIAG:  G20.A1 (ICD-10-CM) - Parkinson's disease  M54.9 (ICD-10-CM) - Back pain    THERAPY DIAG:  Unsteadiness on feet  Abnormal posture  Chronic bilateral low back pain, unspecified whether sciatica present  Rationale for Evaluation and Treatment: Rehabilitation  SUBJECTIVE:  SUBJECTIVE STATEMENT: Pt reports his back is feeling a little stiff today, reports he did a lot of housework yesterday and walked 3 blocks. He reports his balance felt good as well.   Pt accompanied by: self  PERTINENT HISTORY: Per [name] PT from 09/07/22 PT discharge from Low back treatment for focussed on PD related impairments at our clinic "Patient has attended 40 physical therapy treatment sessions since starting current episode of care on 12/20/2021. Patient at first struggled to make progress towards goals and has struggled at times with consistent attendance. However, over the past few weeks he has been improving with multi-discipline interventions including medications and increasing overall activity. It was also recently confirmed he has Parkinson's that is likely contributing to some of his abnormal posture and difficulty with full ROM during movements. Patient is being discharged today due to improvement in condition and so he can start PT with focus on rehabilitation techniques for Parkinson's. Patient was  provided with a long term HEP to help him with long term management of low back pain."  PAIN:  Are you having pain? Yes: NPRS scale: 0/10 Pain location: low back  Pain description: tight and achy  Aggravating factors: when laying down or getting up from sleep  Relieving factors: PT exercises, movement   PRECAUTIONS: None  WEIGHT BEARING RESTRICTIONS: No  FALLS: Has patient fallen in last 6 months? No  LIVING ENVIRONMENT: Lives with: lives alone Lives in: House/apartment Stairs: Yes: External: 5 steps; on left going up Has following equipment at home: None  PLOF: Independent  PATIENT GOALS: Improve his low back pain, improve posture, improve walking.   OBJECTIVE:   DIAGNOSTIC FINDINGS: MRI of L spine 07/12/21: Degenerative disc disease throughout the lumbar region with loss of disc height. No apparent compressive stenosis of the canal. Discogenic endplate marrow changes without active edema at L2-3, L3-4 and L4-5, which could correlate with regional axial back pain.   Left lateral recess and foraminal narrowing at L2-3, left foraminal narrowing at L3-4, and bilateral lateral recess and foraminal narrowing at L4-5. None of the stenoses are severe or are seen to result in definite neural compression.   Shallow left posterolateral disc herniation at L1-2 with caudal migration of a tiny amount of disc material to the left of midline. This does not appear to cause any significant compressive effect upon the neural structures.  COGNITION: Overall cognitive status: Within functional limits for tasks assessed   SENSATION: WFL   POSTURE: rounded shoulders, forward head, and increased thoracic kyphosis  LOWER EXTREMITY ROM:   WNL LOWER EXTREMITY MMT:    MMT Right Eval Left Eval  Hip flexion 4+ 4+  Hip extension    Hip abduction 5 5  Hip adduction 5 5  Hip internal rotation    Hip external rotation    Knee flexion 5 5  Knee extension 5 5  Ankle dorsiflexion 5 5   Ankle plantarflexion 5 5  Ankle inversion    Ankle eversion    (Blank rows = not tested)  BED MOBILITY: WNL    STAIRS: Level of Assistance: Complete Independence Stair Negotiation Technique: Alternating Pattern  with Bilateral Rails Number of Stairs: 4  Height of Stairs: 6    GAIT: Gait pattern:  forward posture and short step length bilateral Distance walked: 30 ft Assistive device utilized: None Level of assistance: Complete Independence   FUNCTIONAL TESTS:  5 times sit to stand: 13.07 6 minute walk test: 1414 ft  Mini Best Test:  PATIENT SURVEYS:  Modified Oswestry 10/50  FOTO 70, risk adjusted goal of 75     TODAY'S TREATMENT:                                                                                                                              DATE: 10/05/22  TE: Physioball rollouts FWD/BCKWD and LTL x multiple reps of each  Seated thoracic ext over backrest of chair 2x10 Seated trunk rotations 10x each side  NMR: gait belt donned and CGA provided unless specified otherwise.  High knee march over 10 meters 2x without dual cog task, 2x with dual cog task. Rates more difficult with dual cog task.  In // bars: multiple reps of the following Tandem gait with and without dual cog task - decreased stability with dual cog task Airex beam tandem gait - rates hard, intermittent UE support   Emphasis on large amplitude STS with trunk twist and clap also for sequencing 10x. Some continued difficulty with sequencing   SLB 2x30 sec each LE - intermittent toe touch to correct  KoreBalance: TuxRacer bilat UE support 3x- very challenging, minimal resistance from balance board. Performed to promote anticipatory postural control, weight-shifts, ankle and hip strategies  Sustained heel raise static balance x 30 sec --then repeated with addition of vertical head turns 10x, then vertical head turn with alt UE reach 10x, then performed with horizontal head turns  10x  Static stand NBOS EC x 60 sec. Difficult, intermittent UE support  PATIENT EDUCATION: Education details: Pt educated throughout session about proper posture and technique with exercises. Improved exercise technique, movement at target joints, use of target muscles after min to mod verbal, visual, tactile cues.  Person educated: Patient Education method: Explanation Education comprehension: verbalized understanding  HOME EXERCISE PROGRAM: Seated PWR! Moves handout gone over and provided.   GOALS: Goals reviewed with patient? Yes  SHORT TERM GOALS: Target date: 10/24/2022       Patient will be independent in progressive home exercise program for PD to improve strength/mobility for better functional independence with ADLs. Baseline: No HEP for PD; 09/28/2022 per previous note pt currently perform PWR interventions as part of HEP Goal status: INITIAL   LONG TERM GOALS: Target date: 12/19/2022    1.  Patient  will complete five times sit to stand test in < 10 seconds indicating an increased LE strength and improved balance. Baseline: 13.07 Goal status: INITIAL  2.  Patient will increase FOTO score to equal to or greater than  75   to demonstrate statistically significant improvement in mobility and quality of life.  Baseline: 70 Goal status: INITIAL   3.  Patient will increase Mini best Balance score by > 4 points to demonstrate decreased fall risk during functional activities. Baseline: 19 Goal status: INITIAL   4.   Patient will reduce dual task timed up and go to <11 seconds to reduce fall risk and demonstrate improved transfer/gait ability. Baseline: 12.97  Goal status: INITIAL  5.   Patient will increase six minute walk test distance to >1000 for progression to community ambulator and improve gait ability Baseline: Test visit 2 09/28/2022: 1414 ft  Goal status: REVISED/MET 09/28/2022  6. Pt will improve modified oswestry score by 5 points or greater in order to  indicate improved function in relation to his back pain   Baseline: 10/50  Goal status: INITIAL    ASSESSMENT:  CLINICAL IMPRESSION: Pt able to advance to more challenging balance tasks that target SLB deficits and impairments with dual cognitive tasks. While pt able to progress, he still has notable difficulty with maintaining postural stability with addition of dual cog task. He still requires intermittent toe-touch throughout with SLB as well. The patient will benefit from skilled physical therapy in order to improve his balance, improve his signs and symptoms of his Parkinson's disease, and to improve his overall quality of life. OBJECTIVE IMPAIRMENTS: Abnormal gait, decreased balance, decreased endurance, decreased mobility, difficulty walking, and hypomobility.   ACTIVITY LIMITATIONS: carrying, lifting, bending, standing, squatting, stairs, and locomotion level  PARTICIPATION LIMITATIONS: community activity  PERSONAL FACTORS: Age and 1-2 comorbidities: Asthma  are also affecting patient's functional outcome.   REHAB POTENTIAL: Good  CLINICAL DECISION MAKING: Stable/uncomplicated  EVALUATION COMPLEXITY: Low  PLAN:  PT FREQUENCY: 2x/week  PT DURATION: 12 weeks  PLANNED INTERVENTIONS: Therapeutic exercises, Therapeutic activity, Neuromuscular re-education, Balance training, Gait training, Patient/Family education, Self Care, Joint mobilization, Joint manipulation, Stair training, Dry Needling, Spinal mobilization, Cryotherapy, Moist heat, and Manual therapy  PLAN FOR NEXT SESSION:  balance, low back pain, and PD specific impairments.    Baird Kay, PT 10/05/2022, 6:01 PM

## 2022-10-10 ENCOUNTER — Ambulatory Visit: Payer: Medicare Other | Admitting: Physical Therapy

## 2022-10-10 DIAGNOSIS — G8929 Other chronic pain: Secondary | ICD-10-CM | POA: Diagnosis not present

## 2022-10-10 DIAGNOSIS — R262 Difficulty in walking, not elsewhere classified: Secondary | ICD-10-CM | POA: Diagnosis not present

## 2022-10-10 DIAGNOSIS — M5459 Other low back pain: Secondary | ICD-10-CM | POA: Diagnosis not present

## 2022-10-10 DIAGNOSIS — R2681 Unsteadiness on feet: Secondary | ICD-10-CM

## 2022-10-10 DIAGNOSIS — M6281 Muscle weakness (generalized): Secondary | ICD-10-CM | POA: Diagnosis not present

## 2022-10-10 DIAGNOSIS — M545 Low back pain, unspecified: Secondary | ICD-10-CM | POA: Diagnosis not present

## 2022-10-10 DIAGNOSIS — R293 Abnormal posture: Secondary | ICD-10-CM

## 2022-10-10 DIAGNOSIS — R278 Other lack of coordination: Secondary | ICD-10-CM | POA: Diagnosis not present

## 2022-10-10 NOTE — Therapy (Signed)
OUTPATIENT PHYSICAL THERAPY NEURO TREATMENT NOTE   Patient Name: Daniel [name] MRN: [id number] DOB:1951/04/21, 72 y.o., male Today's Date: 10/10/2022   PCP:    Denzil Magnuson   REFERRING PROVIDER: Lonell Face, MD   END OF SESSION:  PT End of Session - 10/10/22 1510     Visit Number 5    Number of Visits 24    Date for PT Re-Evaluation 12/19/22    Progress Note Due on Visit 10    PT Start Time 1510    PT Stop Time 1553    PT Time Calculation (min) 43 min    Equipment Utilized During Treatment Gait belt    Activity Tolerance Patient tolerated treatment well    Behavior During Therapy WFL for tasks assessed/performed               Past Medical History:  Diagnosis Date   Actinic keratosis    Asthma    Eczema    Hx of dysplastic nevus 06/06/2006   Mid lower back, slight atypia   Hypothyroidism    Seasonal allergies    Thyroid disease    Past Surgical History:  Procedure Laterality Date   CATARACT EXTRACTION     COLON SURGERY     COLONOSCOPY WITH PROPOFOL N/A 11/18/2014   Procedure: COLONOSCOPY WITH PROPOFOL;  Surgeon: Scot Jun, MD;  Location: [facility] ENDOSCOPY;  Service: Endoscopy;  Laterality: N/A;   COLONOSCOPY WITH PROPOFOL N/A 12/13/2021   Procedure: COLONOSCOPY WITH PROPOFOL;  Surgeon: Regis Bill, MD;  Location: [facility] ENDOSCOPY;  Service: Endoscopy;  Laterality: N/A;   EYE SURGERY     KNEE SURGERY     NASAL SEPTUM SURGERY     Patient Active Problem List   Diagnosis Date Noted   Parkinson's disease 09/26/2022   Chronic idiopathic constipation 06/05/2022   Left lower quadrant abdominal pain 06/05/2022   Paroxysmal atrial fibrillation (HCC) 03/28/2022   Idiopathic hypotension 03/28/2022   Spondylolysis, lumbar region 03/28/2022   Avitaminosis D 03/28/2022   Myalgia due to statin 03/28/2022   Acquired thrombophilia (HCC) 03/28/2022   Hyperlipidemia 03/28/2022   Abnormal gait 03/28/2022   Mild intermittent asthma without  complication 03/28/2022   Zenker's (hypopharyngeal) diverticulum 03/22/2018   Gastroesophageal reflux disease 03/22/2018   Fatty liver 10/09/2017   BPH (benign prostatic hyperplasia) 11/21/2014   Hypothyroidism 11/21/2014   Inguinal hernia 11/21/2014   OA (osteoarthritis) 11/21/2014   H/O adenomatous polyp of colon 10/14/2014    ONSET DATE: 04/28/2022 for PD, back pain began around 09/10/21   REFERRING DIAG:  G20.A1 (ICD-10-CM) - Parkinson's disease  M54.9 (ICD-10-CM) - Back pain    THERAPY DIAG:  Unsteadiness on feet  Abnormal posture  Chronic bilateral low back pain, unspecified whether sciatica present  Other low back pain  Difficulty in walking, not elsewhere classified  Rationale for Evaluation and Treatment: Rehabilitation  SUBJECTIVE:  SUBJECTIVE STATEMENT: Pt reports having some good days and bad days with his back but the exercises are doing a good job of managing the pain. No falls or LOB.    Pt accompanied by: self  PERTINENT HISTORY: Per [name] PT from 09/07/22 PT discharge from Low back treatment for focussed on PD related impairments at our clinic "Patient has attended 40 physical therapy treatment sessions since starting current episode of care on 12/20/2021. Patient at first struggled to make progress towards goals and has struggled at times with consistent attendance. However, over the past few weeks he has been improving with multi-discipline interventions including medications and increasing overall activity. It was also recently confirmed he has Parkinson's that is likely contributing to some of his abnormal posture and difficulty with full ROM during movements. Patient is being discharged today due to improvement in condition and so he can start PT with focus on  rehabilitation techniques for Parkinson's. Patient was provided with a long term HEP to help him with long term management of low back pain."  PAIN:  Are you having pain? Yes: NPRS scale: 0/10 Pain location: low back  Pain description: tight and achy  Aggravating factors: when laying down or getting up from sleep  Relieving factors: PT exercises, movement   PRECAUTIONS: None  WEIGHT BEARING RESTRICTIONS: No  FALLS: Has patient fallen in last 6 months? No  LIVING ENVIRONMENT: Lives with: lives alone Lives in: House/apartment Stairs: Yes: External: 5 steps; on left going up Has following equipment at home: None  PLOF: Independent  PATIENT GOALS: Improve his low back pain, improve posture, improve walking.   OBJECTIVE:   DIAGNOSTIC FINDINGS: MRI of L spine 07/12/21: Degenerative disc disease throughout the lumbar region with loss of disc height. No apparent compressive stenosis of the canal. Discogenic endplate marrow changes without active edema at L2-3, L3-4 and L4-5, which could correlate with regional axial back pain.   Left lateral recess and foraminal narrowing at L2-3, left foraminal narrowing at L3-4, and bilateral lateral recess and foraminal narrowing at L4-5. None of the stenoses are severe or are seen to result in definite neural compression.   Shallow left posterolateral disc herniation at L1-2 with caudal migration of a tiny amount of disc material to the left of midline. This does not appear to cause any significant compressive effect upon the neural structures.  COGNITION: Overall cognitive status: Within functional limits for tasks assessed   SENSATION: WFL   POSTURE: rounded shoulders, forward head, and increased thoracic kyphosis  LOWER EXTREMITY ROM:   WNL LOWER EXTREMITY MMT:    MMT Right Eval Left Eval  Hip flexion 4+ 4+  Hip extension    Hip abduction 5 5  Hip adduction 5 5  Hip internal rotation    Hip external rotation    Knee  flexion 5 5  Knee extension 5 5  Ankle dorsiflexion 5 5  Ankle plantarflexion 5 5  Ankle inversion    Ankle eversion    (Blank rows = not tested)  BED MOBILITY: WNL    STAIRS: Level of Assistance: Complete Independence Stair Negotiation Technique: Alternating Pattern  with Bilateral Rails Number of Stairs: 4  Height of Stairs: 6    GAIT: Gait pattern:  forward posture and short step length bilateral Distance walked: 30 ft Assistive device utilized: None Level of assistance: Complete Independence   FUNCTIONAL TESTS:  5 times sit to stand: 13.07 6 minute walk test: 1414 ft  Mini Best Test:  PATIENT SURVEYS:  Modified Oswestry 10/50  FOTO 70, risk adjusted goal of 75     TODAY'S TREATMENT:                                                                                                                              DATE: 10/10/22  TE:  Octane fitness level 2 x 6 min for aerobic priming and for big UE and LE reciprocal movements   Physioball rollouts FWD/BCKWD and LTL x multiple reps of each x 5 sec holds   NMR: gait belt donned and CGA provided unless specified otherwise.  Seated PWR! Flow x 10 of ea (PWR! Up, rock, twist and step)   5 x 1/2 foam step over with dual cog task. X 8 times through.   Standing PWR! Moves x 10 ea of PRW! Up, rock, twist and step with cues and instruction    PATIENT EDUCATION: Education details: Pt educated throughout session about proper posture and technique with exercises. Improved exercise technique, movement at target joints, use of target muscles after min to mod verbal, visual, tactile cues.  Person educated: Patient Education method: Explanation Education comprehension: verbalized understanding  HOME EXERCISE PROGRAM: Seated PWR! Moves handout gone over and provided.  Standing PWR! Moves handout reviewed and provided.   GOALS: Goals reviewed with patient? Yes  SHORT TERM GOALS: Target date: 10/24/2022        Patient will be independent in progressive home exercise program for PD to improve strength/mobility for better functional independence with ADLs. Baseline: No HEP for PD; 09/28/2022 per previous note pt currently perform PWR interventions as part of HEP Goal status: INITIAL   LONG TERM GOALS: Target date: 12/19/2022    1.  Patient  will complete five times sit to stand test in < 10 seconds indicating an increased LE strength and improved balance. Baseline: 13.07 Goal status: INITIAL  2.  Patient will increase FOTO score to equal to or greater than  75   to demonstrate statistically significant improvement in mobility and quality of life.  Baseline: 70 Goal status: INITIAL   3.  Patient will increase Mini best Balance score by > 4 points to demonstrate decreased fall risk during functional activities. Baseline: 19 Goal status: INITIAL   4.   Patient will reduce dual task timed up and go to <11 seconds to reduce fall risk and demonstrate improved transfer/gait ability. Baseline: 12.97 Goal status: INITIAL  5.   Patient will increase six minute walk test distance to >1000 for progression to community ambulator and improve gait ability Baseline: Test visit 2 09/28/2022: 1414 ft  Goal status: REVISED/MET 09/28/2022  6. Pt will improve modified oswestry score by 5 points or greater in order to indicate improved function in relation to his back pain   Baseline: 10/50  Goal status: INITIAL    ASSESSMENT:  CLINICAL IMPRESSION:  Pt presents with good motivation for PT activities. Pt progresses with PWR! Moves into  flow incorporating more cognitive load with task as well as with PWR! Standing exercises to further challenge balance with HEP for PD specific activities. Pt will continue to benefit from skilled physical therapy intervention to address impairments, improve QOL, and attain therapy goals.    OBJECTIVE IMPAIRMENTS: Abnormal gait, decreased balance, decreased endurance, decreased  mobility, difficulty walking, and hypomobility.   ACTIVITY LIMITATIONS: carrying, lifting, bending, standing, squatting, stairs, and locomotion level  PARTICIPATION LIMITATIONS: community activity  PERSONAL FACTORS: Age and 1-2 comorbidities: Asthma  are also affecting patient's functional outcome.   REHAB POTENTIAL: Good  CLINICAL DECISION MAKING: Stable/uncomplicated  EVALUATION COMPLEXITY: Low  PLAN:  PT FREQUENCY: 2x/week  PT DURATION: 12 weeks  PLANNED INTERVENTIONS: Therapeutic exercises, Therapeutic activity, Neuromuscular re-education, Balance training, Gait training, Patient/Family education, Self Care, Joint mobilization, Joint manipulation, Stair training, Dry Needling, Spinal mobilization, Cryotherapy, Moist heat, and Manual therapy  PLAN FOR NEXT SESSION:  balance, low back pain, and PD specific impairments.    Norman Herrlich, PT 10/10/2022, 4:00 PM

## 2022-10-12 ENCOUNTER — Ambulatory Visit: Payer: Medicare Other

## 2022-10-12 DIAGNOSIS — M6281 Muscle weakness (generalized): Secondary | ICD-10-CM

## 2022-10-12 DIAGNOSIS — R293 Abnormal posture: Secondary | ICD-10-CM

## 2022-10-12 DIAGNOSIS — R278 Other lack of coordination: Secondary | ICD-10-CM

## 2022-10-12 DIAGNOSIS — R2681 Unsteadiness on feet: Secondary | ICD-10-CM | POA: Diagnosis not present

## 2022-10-12 DIAGNOSIS — G8929 Other chronic pain: Secondary | ICD-10-CM | POA: Diagnosis not present

## 2022-10-12 DIAGNOSIS — M5459 Other low back pain: Secondary | ICD-10-CM | POA: Diagnosis not present

## 2022-10-12 DIAGNOSIS — M545 Low back pain, unspecified: Secondary | ICD-10-CM | POA: Diagnosis not present

## 2022-10-12 DIAGNOSIS — R262 Difficulty in walking, not elsewhere classified: Secondary | ICD-10-CM | POA: Diagnosis not present

## 2022-10-12 NOTE — Therapy (Signed)
OUTPATIENT PHYSICAL THERAPY NEURO TREATMENT NOTE   Patient Name: Daniel [name] MRN: [id number] DOB:[birth year], 72 y.o., male Today's Date: 10/12/2022   PCP:    Denzil Magnuson   REFERRING PROVIDER: Lonell Face, MD   END OF SESSION:  PT End of Session - 10/12/22 1603     Visit Number 6    Number of Visits 24    Date for PT Re-Evaluation 12/19/22    Progress Note Due on Visit 10    PT Start Time 1601    PT Stop Time 1643    PT Time Calculation (min) 42 min    Equipment Utilized During Treatment Gait belt    Activity Tolerance Patient tolerated treatment well    Behavior During Therapy WFL for tasks assessed/performed               Past Medical History:  Diagnosis Date   Actinic keratosis    Asthma    Eczema    Hx of dysplastic nevus 06/06/2006   Mid lower back, slight atypia   Hypothyroidism    Seasonal allergies    Thyroid disease    Past Surgical History:  Procedure Laterality Date   CATARACT EXTRACTION     COLON SURGERY     COLONOSCOPY WITH PROPOFOL N/A 11/18/2014   Procedure: COLONOSCOPY WITH PROPOFOL;  Surgeon: Scot Jun, MD;  Location: [facility] ENDOSCOPY;  Service: Endoscopy;  Laterality: N/A;   COLONOSCOPY WITH PROPOFOL N/A 12/13/2021   Procedure: COLONOSCOPY WITH PROPOFOL;  Surgeon: Regis Bill, MD;  Location: [facility] ENDOSCOPY;  Service: Endoscopy;  Laterality: N/A;   EYE SURGERY     KNEE SURGERY     NASAL SEPTUM SURGERY     Patient Active Problem List   Diagnosis Date Noted   Parkinson's disease 09/26/2022   Chronic idiopathic constipation 06/05/2022   Left lower quadrant abdominal pain 06/05/2022   Paroxysmal atrial fibrillation (HCC) 03/28/2022   Idiopathic hypotension 03/28/2022   Spondylolysis, lumbar region 03/28/2022   Avitaminosis D 03/28/2022   Myalgia due to statin 03/28/2022   Acquired thrombophilia (HCC) 03/28/2022   Hyperlipidemia 03/28/2022   Abnormal gait 03/28/2022   Mild intermittent asthma without  complication 03/28/2022   Zenker's (hypopharyngeal) diverticulum 03/22/2018   Gastroesophageal reflux disease 03/22/2018   Fatty liver 10/09/2017   BPH (benign prostatic hyperplasia) 11/21/2014   Hypothyroidism 11/21/2014   Inguinal hernia 11/21/2014   OA (osteoarthritis) 11/21/2014   H/O adenomatous polyp of colon 10/14/2014    ONSET DATE: 04/28/2022 for PD, back pain began around 09/10/21   REFERRING DIAG:  G20.A1 (ICD-10-CM) - Parkinson's disease  M54.9 (ICD-10-CM) - Back pain    THERAPY DIAG:  Unsteadiness on feet  Abnormal posture  Other lack of coordination  Muscle weakness (generalized)  Rationale for Evaluation and Treatment: Rehabilitation  SUBJECTIVE:  SUBJECTIVE STATEMENT: Pt reports some soreness yesterday morning but it went away. He had a very active day and was on his feet most of the day. He reports his back did not hurt nearly as much as it used to with that level of activity. He reports no other updates.   Pt accompanied by: self  PERTINENT HISTORY: Per [name] PT from 09/07/22 PT discharge from Low back treatment for focussed on PD related impairments at our clinic "Patient has attended 40 physical therapy treatment sessions since starting current episode of care on 12/20/2021. Patient at first struggled to make progress towards goals and has struggled at times with consistent attendance. However, over the past few weeks he has been improving with multi-discipline interventions including medications and increasing overall activity. It was also recently confirmed he has Parkinson's that is likely contributing to some of his abnormal posture and difficulty with full ROM during movements. Patient is being discharged today due to improvement in condition and so he can start PT  with focus on rehabilitation techniques for Parkinson's. Patient was provided with a long term HEP to help him with long term management of low back pain."  PAIN:  Are you having pain? Yes: NPRS scale: 0/10 Pain location: low back  Pain description: tight and achy  Aggravating factors: when laying down or getting up from sleep  Relieving factors: PT exercises, movement   PRECAUTIONS: None  WEIGHT BEARING RESTRICTIONS: No  FALLS: Has patient fallen in last 6 months? No  LIVING ENVIRONMENT: Lives with: lives alone Lives in: House/apartment Stairs: Yes: External: 5 steps; on left going up Has following equipment at home: None  PLOF: Independent  PATIENT GOALS: Improve his low back pain, improve posture, improve walking.   OBJECTIVE:   DIAGNOSTIC FINDINGS: MRI of L spine 07/12/21: Degenerative disc disease throughout the lumbar region with loss of disc height. No apparent compressive stenosis of the canal. Discogenic endplate marrow changes without active edema at L2-3, L3-4 and L4-5, which could correlate with regional axial back pain.   Left lateral recess and foraminal narrowing at L2-3, left foraminal narrowing at L3-4, and bilateral lateral recess and foraminal narrowing at L4-5. None of the stenoses are severe or are seen to result in definite neural compression.   Shallow left posterolateral disc herniation at L1-2 with caudal migration of a tiny amount of disc material to the left of midline. This does not appear to cause any significant compressive effect upon the neural structures.  COGNITION: Overall cognitive status: Within functional limits for tasks assessed   SENSATION: WFL   POSTURE: rounded shoulders, forward head, and increased thoracic kyphosis  LOWER EXTREMITY ROM:   WNL LOWER EXTREMITY MMT:    MMT Right Eval Left Eval  Hip flexion 4+ 4+  Hip extension    Hip abduction 5 5  Hip adduction 5 5  Hip internal rotation    Hip external rotation     Knee flexion 5 5  Knee extension 5 5  Ankle dorsiflexion 5 5  Ankle plantarflexion 5 5  Ankle inversion    Ankle eversion    (Blank rows = not tested)  BED MOBILITY: WNL    STAIRS: Level of Assistance: Complete Independence Stair Negotiation Technique: Alternating Pattern  with Bilateral Rails Number of Stairs: 4  Height of Stairs: 6    GAIT: Gait pattern:  forward posture and short step length bilateral Distance walked: 30 ft Assistive device utilized: None Level of assistance: Complete Independence   FUNCTIONAL  TESTS:  5 times sit to stand: 13.07 6 minute walk test: 1414 ft  Mini Best Test:   PATIENT SURVEYS:  Modified Oswestry 10/50  FOTO 70, risk adjusted goal of 75     TODAY'S TREATMENT:                                                                                                                              DATE: 10/12/22  TE:  Octane fitness level 2 x 6 min for aerobic priming and for big UE and LE reciprocal movements. Pt reports feeling some LLE fatigue    Physioball rollouts FWD/BCKWD and LTL x multiple reps of each. Pt reports feeling a stretch   NMR: gait belt donned and CGA provided unless specified otherwise.  Large amplitude movement: STS with UE abduction 10x. Terminated due to reports of increased RLE knee pain (currently wearing soft sleeve)  Lunge position with FWD/BCKWD weight shift with alt UE reach -  15x2 sets  each LE position. Cuing for increased LUE shoulder ext. Requires CGA to correct for LOB  Standing large amplitude LTL step and reach 2x15 each side. Rates medium, pt fatigued Standing forward bow with BUE shoulder ext and backwards step 15x each side   Arm-swing and UE coordination training with PVC pipe in BUE approx 2x148 ft ambulating with cuing throughout and 1x148 without PVC cuing. Pt shows improvement in self-sustaining arm-swing following cues      PATIENT EDUCATION: Education details: Pt educated throughout  session about proper posture and technique with exercises. Improved exercise technique, movement at target joints, use of target muscles after min to mod verbal, visual, tactile cues.  Person educated: Patient Education method: Explanation Education comprehension: verbalized understanding  HOME EXERCISE PROGRAM: Seated PWR! Moves handout gone over and provided.  Standing PWR! Moves handout reviewed and provided.   GOALS: Goals reviewed with patient? Yes  SHORT TERM GOALS: Target date: 10/24/2022       Patient will be independent in progressive home exercise program for PD to improve strength/mobility for better functional independence with ADLs. Baseline: No HEP for PD; 09/28/2022 per previous note pt currently perform PWR interventions as part of HEP Goal status: INITIAL   LONG TERM GOALS: Target date: 12/19/2022    1.  Patient  will complete five times sit to stand test in < 10 seconds indicating an increased LE strength and improved balance. Baseline: 13.07 Goal status: INITIAL  2.  Patient will increase FOTO score to equal to or greater than  75   to demonstrate statistically significant improvement in mobility and quality of life.  Baseline: 70 Goal status: INITIAL   3.  Patient will increase Mini best Balance score by > 4 points to demonstrate decreased fall risk during functional activities. Baseline: 19 Goal status: INITIAL   4.   Patient will reduce dual task timed up and go to <11 seconds to reduce fall risk and demonstrate improved transfer/gait ability. Baseline:  12.97 Goal status: INITIAL  5.   Patient will increase six minute walk test distance to >1000 for progression to community ambulator and improve gait ability Baseline: Test visit 2 09/28/2022: 1414 ft  Goal status: REVISED/MET 09/28/2022  6. Pt will improve modified oswestry score by 5 points or greater in order to indicate improved function in relation to his back pain   Baseline: 10/50  Goal status:  INITIAL    ASSESSMENT:  CLINICAL IMPRESSION:  Continued focus on large-amplitude Parkinson's specific exercises. Pt able to sustain high volume of reps, but is mildly fatigued with some balance difficulty with standing interventions. Pt did show within-session improvement in bilat UE coordination and arm-swing following use of PVCs for cuing. Pt will continue to benefit from skilled physical therapy intervention to address impairments, improve QOL, and attain therapy goals.    OBJECTIVE IMPAIRMENTS: Abnormal gait, decreased balance, decreased endurance, decreased mobility, difficulty walking, and hypomobility.   ACTIVITY LIMITATIONS: carrying, lifting, bending, standing, squatting, stairs, and locomotion level  PARTICIPATION LIMITATIONS: community activity  PERSONAL FACTORS: Age and 1-2 comorbidities: Asthma  are also affecting patient's functional outcome.   REHAB POTENTIAL: Good  CLINICAL DECISION MAKING: Stable/uncomplicated  EVALUATION COMPLEXITY: Low  PLAN:  PT FREQUENCY: 2x/week  PT DURATION: 12 weeks  PLANNED INTERVENTIONS: Therapeutic exercises, Therapeutic activity, Neuromuscular re-education, Balance training, Gait training, Patient/Family education, Self Care, Joint mobilization, Joint manipulation, Stair training, Dry Needling, Spinal mobilization, Cryotherapy, Moist heat, and Manual therapy  PLAN FOR NEXT SESSION:  balance, low back pain, and PD specific impairments.    Baird Kay, PT 10/12/2022, 4:47 PM

## 2022-10-16 ENCOUNTER — Ambulatory Visit: Payer: Medicare Other

## 2022-10-16 DIAGNOSIS — R293 Abnormal posture: Secondary | ICD-10-CM | POA: Diagnosis not present

## 2022-10-16 DIAGNOSIS — R278 Other lack of coordination: Secondary | ICD-10-CM | POA: Diagnosis not present

## 2022-10-16 DIAGNOSIS — G8929 Other chronic pain: Secondary | ICD-10-CM | POA: Diagnosis not present

## 2022-10-16 DIAGNOSIS — M545 Low back pain, unspecified: Secondary | ICD-10-CM | POA: Diagnosis not present

## 2022-10-16 DIAGNOSIS — M6281 Muscle weakness (generalized): Secondary | ICD-10-CM

## 2022-10-16 DIAGNOSIS — R2681 Unsteadiness on feet: Secondary | ICD-10-CM

## 2022-10-16 DIAGNOSIS — R262 Difficulty in walking, not elsewhere classified: Secondary | ICD-10-CM

## 2022-10-16 DIAGNOSIS — M5459 Other low back pain: Secondary | ICD-10-CM | POA: Diagnosis not present

## 2022-10-16 NOTE — Therapy (Signed)
OUTPATIENT PHYSICAL THERAPY NEURO TREATMENT NOTE   Patient Name: Daniel [name] MRN: [id number] DOB:May 22, 1951, 72 y.o., male Today's Date: 10/16/2022   PCP:    Denzil Magnuson   REFERRING PROVIDER: Lonell Face, MD   END OF SESSION:  PT End of Session - 10/16/22 1646     Visit Number 7    Number of Visits 24    Date for PT Re-Evaluation 12/19/22    Progress Note Due on Visit 10    PT Start Time 1645    PT Stop Time 1727    PT Time Calculation (min) 42 min    Equipment Utilized During Treatment Gait belt    Activity Tolerance Patient tolerated treatment well    Behavior During Therapy WFL for tasks assessed/performed                Past Medical History:  Diagnosis Date   Actinic keratosis    Asthma    Eczema    Hx of dysplastic nevus 06/06/2006   Mid lower back, slight atypia   Hypothyroidism    Seasonal allergies    Thyroid disease    Past Surgical History:  Procedure Laterality Date   CATARACT EXTRACTION     COLON SURGERY     COLONOSCOPY WITH PROPOFOL N/A 11/18/2014   Procedure: COLONOSCOPY WITH PROPOFOL;  Surgeon: Scot Jun, MD;  Location: [facility] ENDOSCOPY;  Service: Endoscopy;  Laterality: N/A;   COLONOSCOPY WITH PROPOFOL N/A 12/13/2021   Procedure: COLONOSCOPY WITH PROPOFOL;  Surgeon: Regis Bill, MD;  Location: [facility] ENDOSCOPY;  Service: Endoscopy;  Laterality: N/A;   EYE SURGERY     KNEE SURGERY     NASAL SEPTUM SURGERY     Patient Active Problem List   Diagnosis Date Noted   Parkinson's disease 09/26/2022   Chronic idiopathic constipation 06/05/2022   Left lower quadrant abdominal pain 06/05/2022   Paroxysmal atrial fibrillation (HCC) 03/28/2022   Idiopathic hypotension 03/28/2022   Spondylolysis, lumbar region 03/28/2022   Avitaminosis D 03/28/2022   Myalgia due to statin 03/28/2022   Acquired thrombophilia (HCC) 03/28/2022   Hyperlipidemia 03/28/2022   Abnormal gait 03/28/2022   Mild intermittent asthma without  complication 03/28/2022   Zenker's (hypopharyngeal) diverticulum 03/22/2018   Gastroesophageal reflux disease 03/22/2018   Fatty liver 10/09/2017   BPH (benign prostatic hyperplasia) 11/21/2014   Hypothyroidism 11/21/2014   Inguinal hernia 11/21/2014   OA (osteoarthritis) 11/21/2014   H/O adenomatous polyp of colon 10/14/2014    ONSET DATE: 04/28/2022 for PD, back pain began around 09/10/21   REFERRING DIAG:  G20.A1 (ICD-10-CM) - Parkinson's disease  M54.9 (ICD-10-CM) - Back pain    THERAPY DIAG:  Other lack of coordination  Difficulty in walking, not elsewhere classified  Unsteadiness on feet  Muscle weakness (generalized)  Rationale for Evaluation and Treatment: Rehabilitation  SUBJECTIVE:  SUBJECTIVE STATEMENT: Pt reports some soreness today, rates 3/10. Pt reports thinks it was due to active weekend. Pt reports no stumbles/falls.   Pt accompanied by: self  PERTINENT HISTORY: Per [name] PT from 09/07/22 PT discharge from Low back treatment for focussed on PD related impairments at our clinic "Patient has attended 40 physical therapy treatment sessions since starting current episode of care on 12/20/2021. Patient at first struggled to make progress towards goals and has struggled at times with consistent attendance. However, over the past few weeks he has been improving with multi-discipline interventions including medications and increasing overall activity. It was also recently confirmed he has Parkinson's that is likely contributing to some of his abnormal posture and difficulty with full ROM during movements. Patient is being discharged today due to improvement in condition and so he can start PT with focus on rehabilitation techniques for Parkinson's. Patient was provided with a long  term HEP to help him with long term management of low back pain."  PAIN:  Are you having pain? Yes: NPRS scale: 0/10 Pain location: low back  Pain description: tight and achy  Aggravating factors: when laying down or getting up from sleep  Relieving factors: PT exercises, movement   PRECAUTIONS: None  WEIGHT BEARING RESTRICTIONS: No  FALLS: Has patient fallen in last 6 months? No  LIVING ENVIRONMENT: Lives with: lives alone Lives in: House/apartment Stairs: Yes: External: 5 steps; on left going up Has following equipment at home: None  PLOF: Independent  PATIENT GOALS: Improve his low back pain, improve posture, improve walking.   OBJECTIVE:   DIAGNOSTIC FINDINGS: MRI of L spine 07/12/21: Degenerative disc disease throughout the lumbar region with loss of disc height. No apparent compressive stenosis of the canal. Discogenic endplate marrow changes without active edema at L2-3, L3-4 and L4-5, which could correlate with regional axial back pain.   Left lateral recess and foraminal narrowing at L2-3, left foraminal narrowing at L3-4, and bilateral lateral recess and foraminal narrowing at L4-5. None of the stenoses are severe or are seen to result in definite neural compression.   Shallow left posterolateral disc herniation at L1-2 with caudal migration of a tiny amount of disc material to the left of midline. This does not appear to cause any significant compressive effect upon the neural structures.  COGNITION: Overall cognitive status: Within functional limits for tasks assessed   SENSATION: WFL   POSTURE: rounded shoulders, forward head, and increased thoracic kyphosis  LOWER EXTREMITY ROM:   WNL LOWER EXTREMITY MMT:    MMT Right Eval Left Eval  Hip flexion 4+ 4+  Hip extension    Hip abduction 5 5  Hip adduction 5 5  Hip internal rotation    Hip external rotation    Knee flexion 5 5  Knee extension 5 5  Ankle dorsiflexion 5 5  Ankle plantarflexion 5  5  Ankle inversion    Ankle eversion    (Blank rows = not tested)  BED MOBILITY: WNL    STAIRS: Level of Assistance: Complete Independence Stair Negotiation Technique: Alternating Pattern  with Bilateral Rails Number of Stairs: 4  Height of Stairs: 6    GAIT: Gait pattern:  forward posture and short step length bilateral Distance walked: 30 ft Assistive device utilized: None Level of assistance: Complete Independence   FUNCTIONAL TESTS:  5 times sit to stand: 13.07 6 minute walk test: 1414 ft  Mini Best Test:   PATIENT SURVEYS:  Modified Oswestry 10/50  FOTO 70,  risk adjusted goal of 75     TODAY'S TREATMENT:                                                                                                                              DATE: 10/16/22  TE:  Octane fitness level 3 x 6 min for aerobic priming and for big UE and LE reciprocal movements. Pt reports feeling some LLE fatigue    Physioball rollouts FWD/BCKWD and LTL x multiple reps of each. Pt reports feeling a stretch   Seated thoracic ext over chair 10x   Seated trunk twists 10x each side     NMR: gait belt donned and CGA provided unless specified otherwise.  1.5# AW donned to each wrist  Large amplitude movement: -STS with UE abduction 1x10, 1x8. Rates hard, second set limited due to knee pain -Lunge position with FWD/BCKWD weight shift with alt UE reach -  12x2 sets  each LE position. Cuing for increased BUE shoulder ext. SBA, cuing throughout for sequencing. -Standing large amplitude LTL step and reach 2x15 each side. Rates medium, pt reports fatigue in shoulders. Pt difficulty with sequencing.   Standing forward bow with BUE shoulder ext and backwards step 3x10 alternating each side   Arm-swing and UE coordination training with PVC pipe in BUE approx 2x148 ft with PVCs 2x148 without, then pt repeats this sequence for second round.  Pt able to self-correct within session, but only sustains for a  few minutes at a time.      PATIENT EDUCATION: Education details: Pt educated throughout session about proper posture and technique with exercises. Improved exercise technique, movement at target joints, use of target muscles after min to mod verbal, visual, tactile cues.  Person educated: Patient Education method: Explanation Education comprehension: verbalized understanding  HOME EXERCISE PROGRAM: Seated PWR! Moves handout gone over and provided.  Standing PWR! Moves handout reviewed and provided.   GOALS: Goals reviewed with patient? Yes  SHORT TERM GOALS: Target date: 10/24/2022       Patient will be independent in progressive home exercise program for PD to improve strength/mobility for better functional independence with ADLs. Baseline: No HEP for PD; 09/28/2022 per previous note pt currently perform PWR interventions as part of HEP Goal status: INITIAL   LONG TERM GOALS: Target date: 12/19/2022    1.  Patient  will complete five times sit to stand test in < 10 seconds indicating an increased LE strength and improved balance. Baseline: 13.07 Goal status: INITIAL  2.  Patient will increase FOTO score to equal to or greater than  75   to demonstrate statistically significant improvement in mobility and quality of life.  Baseline: 70 Goal status: INITIAL   3.  Patient will increase Mini best Balance score by > 4 points to demonstrate decreased fall risk during functional activities. Baseline: 19 Goal status: INITIAL   4.   Patient will reduce dual task timed up and go to <  11 seconds to reduce fall risk and demonstrate improved transfer/gait ability. Baseline: 12.97 Goal status: INITIAL  5.   Patient will increase six minute walk test distance to >1000 for progression to community ambulator and improve gait ability Baseline: Test visit 2 09/28/2022: 1414 ft  Goal status: REVISED/MET 09/28/2022  6. Pt will improve modified oswestry score by 5 points or greater in  order to indicate improved function in relation to his back pain   Baseline: 10/50  Goal status: INITIAL    ASSESSMENT:  CLINICAL IMPRESSION: Pt able to advance majority of exercises with donning weights on BUE with only mild fatigue increase. Pt continues to have difficulty with sequencing, but is able to show some improvement within session, before exhibiting decrease in technique. Pt will continue to benefit from skilled physical therapy intervention to address impairments, improve QOL, and attain therapy goals.    OBJECTIVE IMPAIRMENTS: Abnormal gait, decreased balance, decreased endurance, decreased mobility, difficulty walking, and hypomobility.   ACTIVITY LIMITATIONS: carrying, lifting, bending, standing, squatting, stairs, and locomotion level  PARTICIPATION LIMITATIONS: community activity  PERSONAL FACTORS: Age and 1-2 comorbidities: Asthma  are also affecting patient's functional outcome.   REHAB POTENTIAL: Good  CLINICAL DECISION MAKING: Stable/uncomplicated  EVALUATION COMPLEXITY: Low  PLAN:  PT FREQUENCY: 2x/week  PT DURATION: 12 weeks  PLANNED INTERVENTIONS: Therapeutic exercises, Therapeutic activity, Neuromuscular re-education, Balance training, Gait training, Patient/Family education, Self Care, Joint mobilization, Joint manipulation, Stair training, Dry Needling, Spinal mobilization, Cryotherapy, Moist heat, and Manual therapy  PLAN FOR NEXT SESSION:  balance, low back pain, and PD specific impairments, continue plan    Baird Kay, PT 10/16/2022, 5:32 PM

## 2022-10-18 ENCOUNTER — Ambulatory Visit: Payer: Medicare Other

## 2022-10-19 ENCOUNTER — Ambulatory Visit: Payer: Medicare Other

## 2022-10-19 DIAGNOSIS — R2681 Unsteadiness on feet: Secondary | ICD-10-CM | POA: Diagnosis not present

## 2022-10-19 DIAGNOSIS — R278 Other lack of coordination: Secondary | ICD-10-CM | POA: Diagnosis not present

## 2022-10-19 DIAGNOSIS — R293 Abnormal posture: Secondary | ICD-10-CM | POA: Diagnosis not present

## 2022-10-19 DIAGNOSIS — G8929 Other chronic pain: Secondary | ICD-10-CM | POA: Diagnosis not present

## 2022-10-19 DIAGNOSIS — R262 Difficulty in walking, not elsewhere classified: Secondary | ICD-10-CM

## 2022-10-19 DIAGNOSIS — M5459 Other low back pain: Secondary | ICD-10-CM | POA: Diagnosis not present

## 2022-10-19 DIAGNOSIS — M545 Low back pain, unspecified: Secondary | ICD-10-CM | POA: Diagnosis not present

## 2022-10-19 DIAGNOSIS — M6281 Muscle weakness (generalized): Secondary | ICD-10-CM | POA: Diagnosis not present

## 2022-10-19 NOTE — Therapy (Signed)
OUTPATIENT PHYSICAL THERAPY NEURO TREATMENT NOTE   Patient Name: Daniel [name] MRN: [id number] DOB:1951/03/27, 72 y.o., male Today's Date: 10/19/2022   PCP:    Denzil Magnuson   REFERRING PROVIDER: Lonell Face, MD   END OF SESSION:  PT End of Session - 10/19/22 1633     Visit Number 8    Number of Visits 24    Date for PT Re-Evaluation 12/19/22    Progress Note Due on Visit 10    PT Start Time 1547    PT Stop Time 1630    PT Time Calculation (min) 43 min    Equipment Utilized During Treatment Gait belt    Activity Tolerance Patient tolerated treatment well    Behavior During Therapy WFL for tasks assessed/performed                 Past Medical History:  Diagnosis Date   Actinic keratosis    Asthma    Eczema    Hx of dysplastic nevus 06/06/2006   Mid lower back, slight atypia   Hypothyroidism    Seasonal allergies    Thyroid disease    Past Surgical History:  Procedure Laterality Date   CATARACT EXTRACTION     COLON SURGERY     COLONOSCOPY WITH PROPOFOL N/A 11/18/2014   Procedure: COLONOSCOPY WITH PROPOFOL;  Surgeon: Scot Jun, MD;  Location: [facility] ENDOSCOPY;  Service: Endoscopy;  Laterality: N/A;   COLONOSCOPY WITH PROPOFOL N/A 12/13/2021   Procedure: COLONOSCOPY WITH PROPOFOL;  Surgeon: Regis Bill, MD;  Location: [facility] ENDOSCOPY;  Service: Endoscopy;  Laterality: N/A;   EYE SURGERY     KNEE SURGERY     NASAL SEPTUM SURGERY     Patient Active Problem List   Diagnosis Date Noted   Parkinson's disease 09/26/2022   Chronic idiopathic constipation 06/05/2022   Left lower quadrant abdominal pain 06/05/2022   Paroxysmal atrial fibrillation (HCC) 03/28/2022   Idiopathic hypotension 03/28/2022   Spondylolysis, lumbar region 03/28/2022   Avitaminosis D 03/28/2022   Myalgia due to statin 03/28/2022   Acquired thrombophilia (HCC) 03/28/2022   Hyperlipidemia 03/28/2022   Abnormal gait 03/28/2022   Mild intermittent asthma without  complication 03/28/2022   Zenker's (hypopharyngeal) diverticulum 03/22/2018   Gastroesophageal reflux disease 03/22/2018   Fatty liver 10/09/2017   BPH (benign prostatic hyperplasia) 11/21/2014   Hypothyroidism 11/21/2014   Inguinal hernia 11/21/2014   OA (osteoarthritis) 11/21/2014   H/O adenomatous polyp of colon 10/14/2014    ONSET DATE: 04/28/2022 for PD, back pain began around 09/10/21   REFERRING DIAG:  G20.A1 (ICD-10-CM) - Parkinson's disease  M54.9 (ICD-10-CM) - Back pain    THERAPY DIAG:  Unsteadiness on feet  Other lack of coordination  Difficulty in walking, not elsewhere classified  Rationale for Evaluation and Treatment: Rehabilitation  SUBJECTIVE:  SUBJECTIVE STATEMENT: Pt reports no pain currently, no stumbles or falls. He reports his back still hurts in the morning, but not as bad and resolves in an hour.    Pt accompanied by: self  PERTINENT HISTORY: Per [name] PT from 09/07/22 PT discharge from Low back treatment for focussed on PD related impairments at our clinic "Patient has attended 40 physical therapy treatment sessions since starting current episode of care on 12/20/2021. Patient at first struggled to make progress towards goals and has struggled at times with consistent attendance. However, over the past few weeks he has been improving with multi-discipline interventions including medications and increasing overall activity. It was also recently confirmed he has Parkinson's that is likely contributing to some of his abnormal posture and difficulty with full ROM during movements. Patient is being discharged today due to improvement in condition and so he can start PT with focus on rehabilitation techniques for Parkinson's. Patient was provided with a long term HEP to help  him with long term management of low back pain."  PAIN:  Are you having pain? Yes: NPRS scale: 0/10 Pain location: low back  Pain description: tight and achy  Aggravating factors: when laying down or getting up from sleep  Relieving factors: PT exercises, movement   PRECAUTIONS: None  WEIGHT BEARING RESTRICTIONS: No  FALLS: Has patient fallen in last 6 months? No  LIVING ENVIRONMENT: Lives with: lives alone Lives in: House/apartment Stairs: Yes: External: 5 steps; on left going up Has following equipment at home: None  PLOF: Independent  PATIENT GOALS: Improve his low back pain, improve posture, improve walking.   OBJECTIVE:   DIAGNOSTIC FINDINGS: MRI of L spine 07/12/21: Degenerative disc disease throughout the lumbar region with loss of disc height. No apparent compressive stenosis of the canal. Discogenic endplate marrow changes without active edema at L2-3, L3-4 and L4-5, which could correlate with regional axial back pain.   Left lateral recess and foraminal narrowing at L2-3, left foraminal narrowing at L3-4, and bilateral lateral recess and foraminal narrowing at L4-5. None of the stenoses are severe or are seen to result in definite neural compression.   Shallow left posterolateral disc herniation at L1-2 with caudal migration of a tiny amount of disc material to the left of midline. This does not appear to cause any significant compressive effect upon the neural structures.  COGNITION: Overall cognitive status: Within functional limits for tasks assessed   SENSATION: WFL   POSTURE: rounded shoulders, forward head, and increased thoracic kyphosis  LOWER EXTREMITY ROM:   WNL LOWER EXTREMITY MMT:    MMT Right Eval Left Eval  Hip flexion 4+ 4+  Hip extension    Hip abduction 5 5  Hip adduction 5 5  Hip internal rotation    Hip external rotation    Knee flexion 5 5  Knee extension 5 5  Ankle dorsiflexion 5 5  Ankle plantarflexion 5 5  Ankle  inversion    Ankle eversion    (Blank rows = not tested)  BED MOBILITY: WNL    STAIRS: Level of Assistance: Complete Independence Stair Negotiation Technique: Alternating Pattern  with Bilateral Rails Number of Stairs: 4  Height of Stairs: 6    GAIT: Gait pattern:  forward posture and short step length bilateral Distance walked: 30 ft Assistive device utilized: None Level of assistance: Complete Independence   FUNCTIONAL TESTS:  5 times sit to stand: 13.07 6 minute walk test: 1414 ft  Mini Best Test:   PATIENT  SURVEYS:  Modified Oswestry 10/50  FOTO 70, risk adjusted goal of 75     TODAY'S TREATMENT:                                                                                                                              DATE: 10/19/22   NMR: gait belt donned and CGA provided unless specified otherwise.  1.5# AW donned to each wrist  Arm-swing and UE coordination training 3x148 cuing throughout Arm-swing and UE coordination training for shoulder extension with intermittently cone hand-off for cuing 4x148. Pt moderately tired. Arm-swing and UE coordination training dual cog task 3x148. Cuing throughout for arm-swing  1.5# AW donned each wrist Large amplitude movement: -STS with UE abduction 2x10. Limited due to R knee pain. This resolves with rest.  -Lunge position with FWD/BCKWD weight shift with alt UE reach -  2x15  sets  each LE position. Fatiguing, mild decrease in balance corrected with step strategy. Cuing for increased BUE shoulder ext. SBA, cuing throughout for sequencing. -Standing forward bow with BUE shoulder ext and backwards step 1x15 each side. Some improvement with coordination  -Standing large amplitude LTL step and reach 2x15 each side.   Cross-over stepping on airex beam with BUE>UUE support x multiple reps    Airex beam NBOS EC 2x30 sec, requires at least UUE support to maintain balance  PATIENT EDUCATION: Education details: Pt educated  throughout session about proper posture and technique with exercises. Improved exercise technique, movement at target joints, use of target muscles after min to mod verbal, visual, tactile cues.  Person educated: Patient Education method: Explanation Education comprehension: verbalized understanding  HOME EXERCISE PROGRAM: Seated PWR! Moves handout gone over and provided.  Standing PWR! Moves handout reviewed and provided.   GOALS: Goals reviewed with patient? Yes  SHORT TERM GOALS: Target date: 10/24/2022       Patient will be independent in progressive home exercise program for PD to improve strength/mobility for better functional independence with ADLs. Baseline: No HEP for PD; 09/28/2022 per previous note pt currently perform PWR interventions as part of HEP Goal status: INITIAL   LONG TERM GOALS: Target date: 12/19/2022    1.  Patient  will complete five times sit to stand test in < 10 seconds indicating an increased LE strength and improved balance. Baseline: 13.07 Goal status: INITIAL  2.  Patient will increase FOTO score to equal to or greater than  75   to demonstrate statistically significant improvement in mobility and quality of life.  Baseline: 70 Goal status: INITIAL   3.  Patient will increase Mini best Balance score by > 4 points to demonstrate decreased fall risk during functional activities. Baseline: 19 Goal status: INITIAL   4.   Patient will reduce dual task timed up and go to <11 seconds to reduce fall risk and demonstrate improved transfer/gait ability. Baseline: 12.97 Goal status: INITIAL  5.   Patient will increase six minute walk  test distance to >1000 for progression to community ambulator and improve gait ability Baseline: Test visit 2 09/28/2022: 1414 ft  Goal status: REVISED/MET 09/28/2022  6. Pt will improve modified oswestry score by 5 points or greater in order to indicate improved function in relation to his back pain   Baseline:  10/50  Goal status: INITIAL    ASSESSMENT:  CLINICAL IMPRESSION: Pt continues to advance Parkinson's specific exercises with increased volume, indicating improved activity tolerance. Pt still with mild LOB throughout, correcting typically with use of step-strategies. Pt will continue to benefit from skilled physical therapy intervention to address impairments, improve QOL, and attain therapy goals.    OBJECTIVE IMPAIRMENTS: Abnormal gait, decreased balance, decreased endurance, decreased mobility, difficulty walking, and hypomobility.   ACTIVITY LIMITATIONS: carrying, lifting, bending, standing, squatting, stairs, and locomotion level  PARTICIPATION LIMITATIONS: community activity  PERSONAL FACTORS: Age and 1-2 comorbidities: Asthma  are also affecting patient's functional outcome.   REHAB POTENTIAL: Good  CLINICAL DECISION MAKING: Stable/uncomplicated  EVALUATION COMPLEXITY: Low  PLAN:  PT FREQUENCY: 2x/week  PT DURATION: 12 weeks  PLANNED INTERVENTIONS: Therapeutic exercises, Therapeutic activity, Neuromuscular re-education, Balance training, Gait training, Patient/Family education, Self Care, Joint mobilization, Joint manipulation, Stair training, Dry Needling, Spinal mobilization, Cryotherapy, Moist heat, and Manual therapy  PLAN FOR NEXT SESSION:  balance, low back pain, and PD specific impairments, continue plan    Baird Kay, PT 10/19/2022, 4:34 PM

## 2022-10-25 ENCOUNTER — Ambulatory Visit: Payer: Medicare Other

## 2022-10-26 ENCOUNTER — Ambulatory Visit: Payer: Medicare Other | Admitting: Physical Therapy

## 2022-10-26 DIAGNOSIS — R278 Other lack of coordination: Secondary | ICD-10-CM | POA: Diagnosis not present

## 2022-10-26 DIAGNOSIS — M5459 Other low back pain: Secondary | ICD-10-CM | POA: Diagnosis not present

## 2022-10-26 DIAGNOSIS — R293 Abnormal posture: Secondary | ICD-10-CM | POA: Diagnosis not present

## 2022-10-26 DIAGNOSIS — R2681 Unsteadiness on feet: Secondary | ICD-10-CM | POA: Diagnosis not present

## 2022-10-26 DIAGNOSIS — M6281 Muscle weakness (generalized): Secondary | ICD-10-CM

## 2022-10-26 DIAGNOSIS — G8929 Other chronic pain: Secondary | ICD-10-CM | POA: Diagnosis not present

## 2022-10-26 DIAGNOSIS — M545 Low back pain, unspecified: Secondary | ICD-10-CM | POA: Diagnosis not present

## 2022-10-26 DIAGNOSIS — R262 Difficulty in walking, not elsewhere classified: Secondary | ICD-10-CM

## 2022-10-26 NOTE — Therapy (Signed)
OUTPATIENT PHYSICAL THERAPY NEURO TREATMENT NOTE   Patient Name: Daniel [name] MRN: [id number] DOB:[birth year], 72 y.o., male Today's Date: 10/27/2022   PCP:    Denzil Magnuson   REFERRING PROVIDER: Lonell Face, MD   END OF SESSION:  PT End of Session - 10/27/22 1210     Visit Number 9    Number of Visits 24    Date for PT Re-Evaluation 12/19/22    Progress Note Due on Visit 10    PT Start Time 1347    PT Stop Time 1430    PT Time Calculation (min) 43 min    Equipment Utilized During Treatment Gait belt    Activity Tolerance Patient tolerated treatment well    Behavior During Therapy WFL for tasks assessed/performed                  Past Medical History:  Diagnosis Date   Actinic keratosis    Asthma    Eczema    Hx of dysplastic nevus 06/06/2006   Mid lower back, slight atypia   Hypothyroidism    Seasonal allergies    Thyroid disease    Past Surgical History:  Procedure Laterality Date   CATARACT EXTRACTION     COLON SURGERY     COLONOSCOPY WITH PROPOFOL N/A 11/18/2014   Procedure: COLONOSCOPY WITH PROPOFOL;  Surgeon: Scot Jun, MD;  Location: [facility] ENDOSCOPY;  Service: Endoscopy;  Laterality: N/A;   COLONOSCOPY WITH PROPOFOL N/A 12/13/2021   Procedure: COLONOSCOPY WITH PROPOFOL;  Surgeon: Regis Bill, MD;  Location: [facility] ENDOSCOPY;  Service: Endoscopy;  Laterality: N/A;   EYE SURGERY     KNEE SURGERY     NASAL SEPTUM SURGERY     Patient Active Problem List   Diagnosis Date Noted   Parkinson's disease 09/26/2022   Chronic idiopathic constipation 06/05/2022   Left lower quadrant abdominal pain 06/05/2022   Paroxysmal atrial fibrillation (HCC) 03/28/2022   Idiopathic hypotension 03/28/2022   Spondylolysis, lumbar region 03/28/2022   Avitaminosis D 03/28/2022   Myalgia due to statin 03/28/2022   Acquired thrombophilia (HCC) 03/28/2022   Hyperlipidemia 03/28/2022   Abnormal gait 03/28/2022   Mild intermittent asthma  without complication 03/28/2022   Zenker's (hypopharyngeal) diverticulum 03/22/2018   Gastroesophageal reflux disease 03/22/2018   Fatty liver 10/09/2017   BPH (benign prostatic hyperplasia) 11/21/2014   Hypothyroidism 11/21/2014   Inguinal hernia 11/21/2014   OA (osteoarthritis) 11/21/2014   H/O adenomatous polyp of colon 10/14/2014    ONSET DATE: 04/28/2022 for PD, back pain began around 09/10/21   REFERRING DIAG:  G20.A1 (ICD-10-CM) - Parkinson's disease  M54.9 (ICD-10-CM) - Back pain    THERAPY DIAG:  Unsteadiness on feet  Difficulty in walking, not elsewhere classified  Muscle weakness (generalized)  Rationale for Evaluation and Treatment: Rehabilitation  SUBJECTIVE:  SUBJECTIVE STATEMENT:  Pt reports no pain currently, no stumbles or falls. He reports his back still hurts in the morning, but not as bad and resolves quite quickly.    Pt accompanied by: self  PERTINENT HISTORY: Per [name] PT from 09/07/22 PT discharge from Low back treatment for focussed on PD related impairments at our clinic "Patient has attended 40 physical therapy treatment sessions since starting current episode of care on 12/20/2021. Patient at first struggled to make progress towards goals and has struggled at times with consistent attendance. However, over the past few weeks he has been improving with multi-discipline interventions including medications and increasing overall activity. It was also recently confirmed he has Parkinson's that is likely contributing to some of his abnormal posture and difficulty with full ROM during movements. Patient is being discharged today due to improvement in condition and so he can start PT with focus on rehabilitation techniques for Parkinson's. Patient was provided with a long  term HEP to help him with long term management of low back pain."  PAIN:  Are you having pain? Yes: NPRS scale: 0/10 Pain location: low back  Pain description: tight and achy  Aggravating factors: when laying down or getting up from sleep  Relieving factors: PT exercises, movement   PRECAUTIONS: None  WEIGHT BEARING RESTRICTIONS: No  FALLS: Has patient fallen in last 6 months? No  LIVING ENVIRONMENT: Lives with: lives alone Lives in: House/apartment Stairs: Yes: External: 5 steps; on left going up Has following equipment at home: None  PLOF: Independent  PATIENT GOALS: Improve his low back pain, improve posture, improve walking.   OBJECTIVE:   DIAGNOSTIC FINDINGS: MRI of L spine 07/12/21: Degenerative disc disease throughout the lumbar region with loss of disc height. No apparent compressive stenosis of the canal. Discogenic endplate marrow changes without active edema at L2-3, L3-4 and L4-5, which could correlate with regional axial back pain.   Left lateral recess and foraminal narrowing at L2-3, left foraminal narrowing at L3-4, and bilateral lateral recess and foraminal narrowing at L4-5. None of the stenoses are severe or are seen to result in definite neural compression.   Shallow left posterolateral disc herniation at L1-2 with caudal migration of a tiny amount of disc material to the left of midline. This does not appear to cause any significant compressive effect upon the neural structures.  COGNITION: Overall cognitive status: Within functional limits for tasks assessed   SENSATION: WFL   POSTURE: rounded shoulders, forward head, and increased thoracic kyphosis  LOWER EXTREMITY ROM:   WNL LOWER EXTREMITY MMT:    MMT Right Eval Left Eval  Hip flexion 4+ 4+  Hip extension    Hip abduction 5 5  Hip adduction 5 5  Hip internal rotation    Hip external rotation    Knee flexion 5 5  Knee extension 5 5  Ankle dorsiflexion 5 5  Ankle plantarflexion 5  5  Ankle inversion    Ankle eversion    (Blank rows = not tested)  BED MOBILITY: WNL    STAIRS: Level of Assistance: Complete Independence Stair Negotiation Technique: Alternating Pattern  with Bilateral Rails Number of Stairs: 4  Height of Stairs: 6    GAIT: Gait pattern:  forward posture and short step length bilateral Distance walked: 30 ft Assistive device utilized: None Level of assistance: Complete Independence   FUNCTIONAL TESTS:  5 times sit to stand: 13.07 6 minute walk test: 1414 ft  Mini Best Test:   PATIENT  SURVEYS:  Modified Oswestry 10/50  FOTO 70, risk adjusted goal of 75     TODAY'S TREATMENT:                                                                                                                              DATE: 10/27/22   NMR: gait belt donned and CGA provided unless specified otherwise.  Seated PWR! Moves with flow added after 5 reps of each  Flow 2 x 5 with decreasing instruction throughout  All4's PWR! Exercises modified with UE support on mat table due to back discomfort with PWR! Rock in standard all 4's position.  Performed x 10 each with appropriate cueing and handout provided for patient following with instruction to utilize chair with support surface with this activity  Standing PWR! Exercises x 5 ea for up, rock, twist, and step to ensure familiarity for the following activity  All fours flow to PRW! Up to transition to standing to PWR! Step  x 5 rounds -Fatigue noted following but overall good ability to perform patient reports his good functional activity for him getting on and off the floor for his every day exercises.  PATIENT EDUCATION: Education details: Pt educated throughout session about proper posture and technique with exercises. Improved exercise technique, movement at target joints, use of target muscles after min to mod verbal, visual, tactile cues.  Person educated: Patient Education method:  Explanation Education comprehension: verbalized understanding  HOME EXERCISE PROGRAM: Seated PWR! Moves handout gone over and provided.  Standing PWR! Moves handout reviewed and provided.  All fours modified to chair UE support with PWR! moves handout reviewed and provided  GOALS: Goals reviewed with patient? Yes  SHORT TERM GOALS: Target date: 10/24/2022       Patient will be independent in progressive home exercise program for PD to improve strength/mobility for better functional independence with ADLs. Baseline: No HEP for PD; 09/28/2022 per previous note pt currently perform PWR interventions as part of HEP Goal status: INITIAL   LONG TERM GOALS: Target date: 12/19/2022    1.  Patient  will complete five times sit to stand test in < 10 seconds indicating an increased LE strength and improved balance. Baseline: 13.07 Goal status: INITIAL  2.  Patient will increase FOTO score to equal to or greater than  75   to demonstrate statistically significant improvement in mobility and quality of life.  Baseline: 70 Goal status: INITIAL   3.  Patient will increase Mini best Balance score by > 4 points to demonstrate decreased fall risk during functional activities. Baseline: 19 Goal status: INITIAL   4.   Patient will reduce dual task timed up and go to <11 seconds to reduce fall risk and demonstrate improved transfer/gait ability. Baseline: 12.97 Goal status: INITIAL  5.   Patient will increase six minute walk test distance to >1000 for progression to community ambulator and improve gait ability Baseline: Test visit 2 09/28/2022: 1414 ft  Goal status: REVISED/MET 09/28/2022  6. Pt will improve modified oswestry score by 5 points or greater in order to indicate improved function in relation to his back pain   Baseline: 10/50  Goal status: INITIAL    ASSESSMENT:  CLINICAL IMPRESSION:  Pt continues to advance Parkinson's specific exercises with increased volume, indicating  improved activity tolerance.  Physical therapist had to modify PWR!  All fours activity in order to ensure comfort in patient's back but with these modifications patient was able to complete safely without increased pain.  Pt will continue to benefit from skilled physical therapy intervention to address impairments, improve QOL, and attain therapy goals.    OBJECTIVE IMPAIRMENTS: Abnormal gait, decreased balance, decreased endurance, decreased mobility, difficulty walking, and hypomobility.   ACTIVITY LIMITATIONS: carrying, lifting, bending, standing, squatting, stairs, and locomotion level  PARTICIPATION LIMITATIONS: community activity  PERSONAL FACTORS: Age and 1-2 comorbidities: Asthma  are also affecting patient's functional outcome.   REHAB POTENTIAL: Good  CLINICAL DECISION MAKING: Stable/uncomplicated  EVALUATION COMPLEXITY: Low  PLAN:  PT FREQUENCY: 2x/week  PT DURATION: 12 weeks  PLANNED INTERVENTIONS: Therapeutic exercises, Therapeutic activity, Neuromuscular re-education, Balance training, Gait training, Patient/Family education, Self Care, Joint mobilization, Joint manipulation, Stair training, Dry Needling, Spinal mobilization, Cryotherapy, Moist heat, and Manual therapy  PLAN FOR NEXT SESSION:  balance, low back pain, and PD specific impairments, continue plan    Norman Herrlich, PT 10/27/2022, 12:11 PM

## 2022-10-27 ENCOUNTER — Encounter: Payer: Self-pay | Admitting: Physical Therapy

## 2022-10-31 ENCOUNTER — Ambulatory Visit: Payer: Medicare Other | Attending: Neurology

## 2022-10-31 DIAGNOSIS — R278 Other lack of coordination: Secondary | ICD-10-CM | POA: Diagnosis not present

## 2022-10-31 DIAGNOSIS — M545 Low back pain, unspecified: Secondary | ICD-10-CM | POA: Diagnosis not present

## 2022-10-31 DIAGNOSIS — R293 Abnormal posture: Secondary | ICD-10-CM | POA: Diagnosis not present

## 2022-10-31 DIAGNOSIS — M546 Pain in thoracic spine: Secondary | ICD-10-CM | POA: Insufficient documentation

## 2022-10-31 DIAGNOSIS — M6281 Muscle weakness (generalized): Secondary | ICD-10-CM | POA: Diagnosis not present

## 2022-10-31 DIAGNOSIS — M5459 Other low back pain: Secondary | ICD-10-CM | POA: Diagnosis not present

## 2022-10-31 DIAGNOSIS — M25551 Pain in right hip: Secondary | ICD-10-CM | POA: Diagnosis not present

## 2022-10-31 DIAGNOSIS — R262 Difficulty in walking, not elsewhere classified: Secondary | ICD-10-CM | POA: Insufficient documentation

## 2022-10-31 DIAGNOSIS — G8929 Other chronic pain: Secondary | ICD-10-CM | POA: Insufficient documentation

## 2022-10-31 DIAGNOSIS — R2681 Unsteadiness on feet: Secondary | ICD-10-CM | POA: Insufficient documentation

## 2022-10-31 NOTE — Therapy (Signed)
OUTPATIENT PHYSICAL THERAPY NEURO TREATMENT NOTE/Physical Therapy Progress Note   Dates of reporting period  09/26/2022   to   10/31/2022    Patient Name: Daniel [name] MRN: [id number] DOB:Aug 21, 1950, 72 y.o., male Today's Date: 11/01/2022   PCP:    Denzil Magnuson   REFERRING PROVIDER: Lonell Face, MD   END OF SESSION:  PT End of Session - 11/01/22 1001     Visit Number 10    Number of Visits 24    Date for PT Re-Evaluation 12/19/22    Progress Note Due on Visit 10    PT Start Time 1647    PT Stop Time 1728    PT Time Calculation (min) 41 min    Equipment Utilized During Treatment Gait belt    Activity Tolerance Patient tolerated treatment well    Behavior During Therapy [facility] for tasks assessed/performed                   Past Medical History:  Diagnosis Date   Actinic keratosis    Asthma    Eczema    Hx of dysplastic nevus 06/06/2006   Mid lower back, slight atypia   Hypothyroidism    Seasonal allergies    Thyroid disease    Past Surgical History:  Procedure Laterality Date   CATARACT EXTRACTION     COLON SURGERY     COLONOSCOPY WITH PROPOFOL N/A 11/18/2014   Procedure: COLONOSCOPY WITH PROPOFOL;  Surgeon: Scot Jun, MD;  Location: [facility] ENDOSCOPY;  Service: Endoscopy;  Laterality: N/A;   COLONOSCOPY WITH PROPOFOL N/A 12/13/2021   Procedure: COLONOSCOPY WITH PROPOFOL;  Surgeon: Regis Bill, MD;  Location: [facility] ENDOSCOPY;  Service: Endoscopy;  Laterality: N/A;   EYE SURGERY     KNEE SURGERY     NASAL SEPTUM SURGERY     Patient Active Problem List   Diagnosis Date Noted   Parkinson's disease 09/26/2022   Chronic idiopathic constipation 06/05/2022   Left lower quadrant abdominal pain 06/05/2022   Paroxysmal atrial fibrillation (HCC) 03/28/2022   Idiopathic hypotension 03/28/2022   Spondylolysis, lumbar region 03/28/2022   Avitaminosis D 03/28/2022   Myalgia due to statin 03/28/2022   Acquired thrombophilia (HCC)  03/28/2022   Hyperlipidemia 03/28/2022   Abnormal gait 03/28/2022   Mild intermittent asthma without complication 03/28/2022   Zenker's (hypopharyngeal) diverticulum 03/22/2018   Gastroesophageal reflux disease 03/22/2018   Fatty liver 10/09/2017   BPH (benign prostatic hyperplasia) 11/21/2014   Hypothyroidism 11/21/2014   Inguinal hernia 11/21/2014   OA (osteoarthritis) 11/21/2014   H/O adenomatous polyp of colon 10/14/2014    ONSET DATE: 04/28/2022 for PD, back pain began around 09/10/21   REFERRING DIAG:  G20.A1 (ICD-10-CM) - Parkinson's disease  M54.9 (ICD-10-CM) - Back pain    THERAPY DIAG:  Unsteadiness on feet  Difficulty in walking, not elsewhere classified  Other lack of coordination  Rationale for Evaluation and Treatment: Rehabilitation  SUBJECTIVE:  SUBJECTIVE STATEMENT:  Pt reports no pain currently, no stumbles or falls. He reports his back still hurts in the morning for about an hour, but not as bad as it used to be and it resolves quickly.    Pt accompanied by: self  PERTINENT HISTORY: Per [name] PT from 09/07/22 PT discharge from Low back treatment for focussed on PD related impairments at our clinic "Patient has attended 40 physical therapy treatment sessions since starting current episode of care on 12/20/2021. Patient at first struggled to make progress towards goals and has struggled at times with consistent attendance. However, over the past few weeks he has been improving with multi-discipline interventions including medications and increasing overall activity. It was also recently confirmed he has Parkinson's that is likely contributing to some of his abnormal posture and difficulty with full ROM during movements. Patient is being discharged today due to improvement in  condition and so he can start PT with focus on rehabilitation techniques for Parkinson's. Patient was provided with a long term HEP to help him with long term management of low back pain."  PAIN:  Are you having pain? Yes: NPRS scale: 0/10 Pain location: low back  Pain description: tight and achy  Aggravating factors: when laying down or getting up from sleep  Relieving factors: PT exercises, movement   PRECAUTIONS: None  WEIGHT BEARING RESTRICTIONS: No  FALLS: Has patient fallen in last 6 months? No  LIVING ENVIRONMENT: Lives with: lives alone Lives in: House/apartment Stairs: Yes: External: 5 steps; on left going up Has following equipment at home: None  PLOF: Independent  PATIENT GOALS: Improve his low back pain, improve posture, improve walking.   OBJECTIVE:   DIAGNOSTIC FINDINGS: MRI of L spine 07/12/21: Degenerative disc disease throughout the lumbar region with loss of disc height. No apparent compressive stenosis of the canal. Discogenic endplate marrow changes without active edema at L2-3, L3-4 and L4-5, which could correlate with regional axial back pain.   Left lateral recess and foraminal narrowing at L2-3, left foraminal narrowing at L3-4, and bilateral lateral recess and foraminal narrowing at L4-5. None of the stenoses are severe or are seen to result in definite neural compression.   Shallow left posterolateral disc herniation at L1-2 with caudal migration of a tiny amount of disc material to the left of midline. This does not appear to cause any significant compressive effect upon the neural structures.  COGNITION: Overall cognitive status: Within functional limits for tasks assessed   SENSATION: WFL   POSTURE: rounded shoulders, forward head, and increased thoracic kyphosis  LOWER EXTREMITY ROM:   WNL LOWER EXTREMITY MMT:    MMT Right Eval Left Eval  Hip flexion 4+ 4+  Hip extension    Hip abduction 5 5  Hip adduction 5 5  Hip internal  rotation    Hip external rotation    Knee flexion 5 5  Knee extension 5 5  Ankle dorsiflexion 5 5  Ankle plantarflexion 5 5  Ankle inversion    Ankle eversion    (Blank rows = not tested)  BED MOBILITY: WNL    STAIRS: Level of Assistance: Complete Independence Stair Negotiation Technique: Alternating Pattern  with Bilateral Rails Number of Stairs: 4  Height of Stairs: 6    GAIT: Gait pattern:  forward posture and short step length bilateral Distance walked: 30 ft Assistive device utilized: None Level of assistance: Complete Independence   FUNCTIONAL TESTS:  5 times sit to stand: 13.07 6 minute walk test:  1414 ft  Mini Best Test:   PATIENT SURVEYS:  Modified Oswestry 10/50  FOTO 70, risk adjusted goal of 75     TODAY'S TREATMENT:                                                                                                                              DATE: 11/01/22  TA: Goal testing completed for progress visit. PT reviewed goals with pt and indications of performance. Please refer to goal section below for details  PATIENT EDUCATION: Education details: Pt educated throughout session about proper posture and technique with exercises. Improved exercise technique, movement at target joints, use of target muscles after min to mod verbal, visual, tactile cues. Goals, plan, progress  Person educated: Patient Education method: Explanation Education comprehension: verbalized understanding  HOME EXERCISE PROGRAM:Pt to continue HEP as listed below Seated PWR! Moves handout gone over and provided.  Standing PWR! Moves handout reviewed and provided.  All fours modified to chair UE support with PWR! moves handout reviewed and provided   S: Pt reports no pain.   GOALS: Goals reviewed with patient? Yes  SHORT TERM GOALS: Target date: 10/24/2022       Patient will be independent in progressive home exercise program for PD to improve strength/mobility for better  functional independence with ADLs. Baseline: No HEP for PD; 09/28/2022 per previous note pt currently perform PWR interventions as part of HEP; 10/31/22: Pt reports he feels comfortable with his HEP Goal status: MET   LONG TERM GOALS: Target date: 12/19/2022    1.  Patient  will complete five times sit to stand test in < 10 seconds indicating an increased LE strength and improved balance. Baseline: 13.07; 10/31/22 10.4 seconds hands-free  Goal status: PARTIALLY MET   2.  Patient will increase FOTO score to equal to or greater than  75   to demonstrate statistically significant improvement in mobility and quality of life.  Baseline: 70; 10/31/2022: 71 Goal status: ONGOING   3.  Patient will increase Mini best Balance score by > 4 points to demonstrate decreased fall risk during functional activities. Baseline: 19; 10/31/22: 23/28 Goal status: PARTIALLY MET   4.   Patient will reduce dual task timed up and go to <11 seconds to reduce fall risk and demonstrate improved transfer/gait ability. Baseline: 12.97l 10/31/22: 8 sec Goal status: MET  5.   Patient will increase six minute walk test distance to >1000 for progression to community ambulator and improve gait ability Baseline: Test visit 2 09/28/2022: 1414 ft; 10/31/2022: 1628 ft (previously met) Goal status: REVISED/MET 09/28/2022  6. Pt will improve modified oswestry score by 5 points or greater in order to indicate improved function in relation to his back pain   Baseline: 10/50; 11/01/22: 60%   Goal status: MET    ASSESSMENT:  CLINICAL IMPRESSION: Goals reassessed for progress note. Pt making gains AEB partially meeting MiniBest and 5xSTS goals, as well as meeting TUG  goal, indicating improvements in balance, LE strength and decreased fall risk. Although pt previously met , he has continued to improve performance. Pt also with modest increase in FOTO score today. Patient's condition has the potential to improve in response to therapy.  Maximum improvement is yet to be obtained. The anticipated improvement is attainable and reasonable in a generally predictable time. Pt will continue to benefit from skilled physical therapy intervention to address impairments, improve QOL, and attain therapy goals.    OBJECTIVE IMPAIRMENTS: Abnormal gait, decreased balance, decreased endurance, decreased mobility, difficulty walking, and hypomobility.   ACTIVITY LIMITATIONS: carrying, lifting, bending, standing, squatting, stairs, and locomotion level  PARTICIPATION LIMITATIONS: community activity  PERSONAL FACTORS: Age and 1-2 comorbidities: Asthma  are also affecting patient's functional outcome.   REHAB POTENTIAL: Good  CLINICAL DECISION MAKING: Stable/uncomplicated  EVALUATION COMPLEXITY: Low  PLAN:  PT FREQUENCY: 2x/week  PT DURATION: 12 weeks  PLANNED INTERVENTIONS: Therapeutic exercises, Therapeutic activity, Neuromuscular re-education, Balance training, Gait training, Patient/Family education, Self Care, Joint mobilization, Joint manipulation, Stair training, Dry Needling, Spinal mobilization, Cryotherapy, Moist heat, and Manual therapy  PLAN FOR NEXT SESSION:  balance, low back pain, and PD specific impairments, continue plan    Baird Kay, PT 11/01/2022, 10:11 AM

## 2022-11-02 ENCOUNTER — Ambulatory Visit: Payer: Medicare Other

## 2022-11-02 DIAGNOSIS — R2681 Unsteadiness on feet: Secondary | ICD-10-CM | POA: Diagnosis not present

## 2022-11-02 DIAGNOSIS — M6281 Muscle weakness (generalized): Secondary | ICD-10-CM

## 2022-11-02 DIAGNOSIS — R262 Difficulty in walking, not elsewhere classified: Secondary | ICD-10-CM | POA: Diagnosis not present

## 2022-11-02 DIAGNOSIS — R278 Other lack of coordination: Secondary | ICD-10-CM

## 2022-11-02 DIAGNOSIS — M546 Pain in thoracic spine: Secondary | ICD-10-CM | POA: Diagnosis not present

## 2022-11-02 DIAGNOSIS — G8929 Other chronic pain: Secondary | ICD-10-CM | POA: Diagnosis not present

## 2022-11-02 DIAGNOSIS — M545 Low back pain, unspecified: Secondary | ICD-10-CM | POA: Diagnosis not present

## 2022-11-02 DIAGNOSIS — R293 Abnormal posture: Secondary | ICD-10-CM | POA: Diagnosis not present

## 2022-11-02 DIAGNOSIS — M25551 Pain in right hip: Secondary | ICD-10-CM

## 2022-11-02 DIAGNOSIS — M5459 Other low back pain: Secondary | ICD-10-CM | POA: Diagnosis not present

## 2022-11-02 NOTE — Therapy (Signed)
OUTPATIENT PHYSICAL THERAPY NEURO TREATMENT NOTE    Patient Name: Daniel [name] MRN: [id number] DOB:1950/05/31, 72 y.o., male Today's Date: 11/02/2022   PCP:    Denzil Magnuson   REFERRING PROVIDER: Lonell Face, MD   END OF SESSION:  PT End of Session - 11/02/22 1730     Visit Number 11    Number of Visits 24    Date for PT Re-Evaluation 12/19/22    Progress Note Due on Visit 10    PT Start Time 1649    PT Stop Time 1728    PT Time Calculation (min) 39 min    Equipment Utilized During Treatment Gait belt    Activity Tolerance Patient tolerated treatment well;Patient limited by pain    Behavior During Therapy [facility] for tasks assessed/performed                    Past Medical History:  Diagnosis Date   Actinic keratosis    Asthma    Eczema    Hx of dysplastic nevus 06/06/2006   Mid lower back, slight atypia   Hypothyroidism    Seasonal allergies    Thyroid disease    Past Surgical History:  Procedure Laterality Date   CATARACT EXTRACTION     COLON SURGERY     COLONOSCOPY WITH PROPOFOL N/A 11/18/2014   Procedure: COLONOSCOPY WITH PROPOFOL;  Surgeon: Scot Jun, MD;  Location: [facility] ENDOSCOPY;  Service: Endoscopy;  Laterality: N/A;   COLONOSCOPY WITH PROPOFOL N/A 12/13/2021   Procedure: COLONOSCOPY WITH PROPOFOL;  Surgeon: Regis Bill, MD;  Location: [facility] ENDOSCOPY;  Service: Endoscopy;  Laterality: N/A;   EYE SURGERY     KNEE SURGERY     NASAL SEPTUM SURGERY     Patient Active Problem List   Diagnosis Date Noted   Parkinson's disease 09/26/2022   Chronic idiopathic constipation 06/05/2022   Left lower quadrant abdominal pain 06/05/2022   Paroxysmal atrial fibrillation (HCC) 03/28/2022   Idiopathic hypotension 03/28/2022   Spondylolysis, lumbar region 03/28/2022   Avitaminosis D 03/28/2022   Myalgia due to statin 03/28/2022   Acquired thrombophilia (HCC) 03/28/2022   Hyperlipidemia 03/28/2022   Abnormal gait 03/28/2022    Mild intermittent asthma without complication 03/28/2022   Zenker's (hypopharyngeal) diverticulum 03/22/2018   Gastroesophageal reflux disease 03/22/2018   Fatty liver 10/09/2017   BPH (benign prostatic hyperplasia) 11/21/2014   Hypothyroidism 11/21/2014   Inguinal hernia 11/21/2014   OA (osteoarthritis) 11/21/2014   H/O adenomatous polyp of colon 10/14/2014    ONSET DATE: 04/28/2022 for PD, back pain began around 09/10/21   REFERRING DIAG:  G20.A1 (ICD-10-CM) - Parkinson's disease  M54.9 (ICD-10-CM) - Back pain    THERAPY DIAG:  Other lack of coordination  Unsteadiness on feet  Muscle weakness (generalized)  Difficulty in walking, not elsewhere classified  Abnormal posture  Pain in right hip  Rationale for Evaluation and Treatment: Rehabilitation  SUBJECTIVE:  SUBJECTIVE STATEMENT:  Pt reports R side hip pain for most of the day.  Pt reports onset today and he is unsure why. Pt's hip pain is OK at the moment.  When he has his pain he describes it as sharp (has had this in the past), and reports it feels like it is in the joint. He reports ambulating and exercise makes it feel better.   Pt accompanied by: self  PERTINENT HISTORY: Per [name] PT from 09/07/22 PT discharge from Low back treatment for focussed on PD related impairments at our clinic "Patient has attended 40 physical therapy treatment sessions since starting current episode of care on 12/20/2021. Patient at first struggled to make progress towards goals and has struggled at times with consistent attendance. However, over the past few weeks he has been improving with multi-discipline interventions including medications and increasing overall activity. It was also recently confirmed he has Parkinson's that is likely contributing  to some of his abnormal posture and difficulty with full ROM during movements. Patient is being discharged today due to improvement in condition and so he can start PT with focus on rehabilitation techniques for Parkinson's. Patient was provided with a long term HEP to help him with long term management of low back pain."  PAIN:  Are you having pain? Yes: NPRS scale: 0/10 Pain location: low back  Pain description: tight and achy  Aggravating factors: when laying down or getting up from sleep  Relieving factors: PT exercises, movement   PRECAUTIONS: None  WEIGHT BEARING RESTRICTIONS: No  FALLS: Has patient fallen in last 6 months? No  LIVING ENVIRONMENT: Lives with: lives alone Lives in: House/apartment Stairs: Yes: External: 5 steps; on left going up Has following equipment at home: None  PLOF: Independent  PATIENT GOALS: Improve his low back pain, improve posture, improve walking.   OBJECTIVE:   DIAGNOSTIC FINDINGS: MRI of L spine 07/12/21: Degenerative disc disease throughout the lumbar region with loss of disc height. No apparent compressive stenosis of the canal. Discogenic endplate marrow changes without active edema at L2-3, L3-4 and L4-5, which could correlate with regional axial back pain.   Left lateral recess and foraminal narrowing at L2-3, left foraminal narrowing at L3-4, and bilateral lateral recess and foraminal narrowing at L4-5. None of the stenoses are severe or are seen to result in definite neural compression.   Shallow left posterolateral disc herniation at L1-2 with caudal migration of a tiny amount of disc material to the left of midline. This does not appear to cause any significant compressive effect upon the neural structures.  COGNITION: Overall cognitive status: Within functional limits for tasks assessed   SENSATION: WFL   POSTURE: rounded shoulders, forward head, and increased thoracic kyphosis  LOWER EXTREMITY ROM:   WNL LOWER  EXTREMITY MMT:    MMT Right Eval Left Eval  Hip flexion 4+ 4+  Hip extension    Hip abduction 5 5  Hip adduction 5 5  Hip internal rotation    Hip external rotation    Knee flexion 5 5  Knee extension 5 5  Ankle dorsiflexion 5 5  Ankle plantarflexion 5 5  Ankle inversion    Ankle eversion    (Blank rows = not tested)  BED MOBILITY: WNL    STAIRS: Level of Assistance: Complete Independence Stair Negotiation Technique: Alternating Pattern  with Bilateral Rails Number of Stairs: 4  Height of Stairs: 6    GAIT: Gait pattern:  forward posture and short step length  bilateral Distance walked: 30 ft Assistive device utilized: None Level of assistance: Complete Independence   FUNCTIONAL TESTS:  5 times sit to stand: 13.07 6 minute walk test: 1414 ft  Mini Best Test:   PATIENT SURVEYS:  Modified Oswestry 10/50  FOTO 70, risk adjusted goal of 75     TODAY'S TREATMENT:                                                                                                                              DATE: 11/02/22  NMR:   Large amplitude arm-swing with gait with PVC in each UE - 3x148 -- then without PVC 2x148 ft. Starts with improved arm-swing and decreases with distraction/fatigue   1.5# AW donned to each wrist  Large amplitude movement: -STS with UE abduction 1x10, 1x8. Rates hard. Pt reports some mild hip pain, intervention discontinued -Lunge position with FWD/BCKWD weight shift with alt UE reach -  12x2 sets   each LE position. Cuing for increased BUE shoulder ext. CGA. Pt reports mildly unsteadiness, slightly more difficult with RLE forward compared to L.. No hip pain with intervention. -Standing large amplitude LTL step and reach 2x15 each side. Rates medium. Pt reports mildly fatiguing.   Standing forward bow with BUE shoulder ext and backwards step 2x15 each side   TE:  -Alternating step-ups onto 6" step - as many reps as possible in 60 seconds. Strength  part easy, but rates coordination hard -Side step-ups onto 6" step with bilat UE support 15x each side. Rates easy, but limited due to mild R knee discomfort.      PATIENT EDUCATION: Education details: Pt educated throughout session about proper posture and technique with exercises. Improved exercise technique, movement at target joints, use of target muscles after min to mod verbal, visual, tactile cues. Goals, plan, progress  Person educated: Patient Education method: Explanation Education comprehension: verbalized understanding  HOME EXERCISE PROGRAM:Pt to continue HEP as listed below Seated PWR! Moves handout gone over and provided.  Standing PWR! Moves handout reviewed and provided.  All fours modified to chair UE support with PWR! moves handout reviewed and provided   S: Pt reports no pain.   GOALS: Goals reviewed with patient? Yes  SHORT TERM GOALS: Target date: 10/24/2022       Patient will be independent in progressive home exercise program for PD to improve strength/mobility for better functional independence with ADLs. Baseline: No HEP for PD; 09/28/2022 per previous note pt currently perform PWR interventions as part of HEP; 10/31/22: Pt reports he feels comfortable with his HEP Goal status: MET   LONG TERM GOALS: Target date: 12/19/2022    1.  Patient  will complete five times sit to stand test in < 10 seconds indicating an increased LE strength and improved balance. Baseline: 13.07; 10/31/22 10.4 seconds hands-free  Goal status: PARTIALLY MET   2.  Patient will increase FOTO score to equal to or greater than  75  to demonstrate statistically significant improvement in mobility and quality of life.  Baseline: 70; 10/31/2022: 71 Goal status: ONGOING   3.  Patient will increase Mini best Balance score by > 4 points to demonstrate decreased fall risk during functional activities. Baseline: 19; 10/31/22: 23/28 Goal status: PARTIALLY MET   4.   Patient will reduce dual  task timed up and go to <11 seconds to reduce fall risk and demonstrate improved transfer/gait ability. Baseline: 12.97l 10/31/22: 8 sec Goal status: MET  5.   Patient will increase six minute walk test distance to >1000 for progression to community ambulator and improve gait ability Baseline: Test visit 2 09/28/2022: 1414 ft; 10/31/2022: 1628 ft (previously met) Goal status: REVISED/MET 09/28/2022  6. Pt will improve modified oswestry score by 5 points or greater in order to indicate improved function in relation to his back pain   Baseline: 10/50; 11/01/22: 60%   Goal status: MET    ASSESSMENT:  CLINICAL IMPRESSION: Pt slightly limited in session today due to mild R hip discomfort with PD specific interventions. Will continue to monitor. Pt will continue to benefit from skilled physical therapy intervention to address impairments, improve QOL, and attain therapy goals.    OBJECTIVE IMPAIRMENTS: Abnormal gait, decreased balance, decreased endurance, decreased mobility, difficulty walking, and hypomobility.   ACTIVITY LIMITATIONS: carrying, lifting, bending, standing, squatting, stairs, and locomotion level  PARTICIPATION LIMITATIONS: community activity  PERSONAL FACTORS: Age and 1-2 comorbidities: Asthma  are also affecting patient's functional outcome.   REHAB POTENTIAL: Good  CLINICAL DECISION MAKING: Stable/uncomplicated  EVALUATION COMPLEXITY: Low  PLAN:  PT FREQUENCY: 2x/week  PT DURATION: 12 weeks  PLANNED INTERVENTIONS: Therapeutic exercises, Therapeutic activity, Neuromuscular re-education, Balance training, Gait training, Patient/Family education, Self Care, Joint mobilization, Joint manipulation, Stair training, Dry Needling, Spinal mobilization, Cryotherapy, Moist heat, and Manual therapy  PLAN FOR NEXT SESSION:  balance, low back pain, and PD specific impairments, continue plan    Baird Kay, PT 11/02/2022, 5:34 PM

## 2022-11-06 ENCOUNTER — Ambulatory Visit: Payer: Medicare Other

## 2022-11-06 DIAGNOSIS — M5459 Other low back pain: Secondary | ICD-10-CM | POA: Diagnosis not present

## 2022-11-06 DIAGNOSIS — R2681 Unsteadiness on feet: Secondary | ICD-10-CM | POA: Diagnosis not present

## 2022-11-06 DIAGNOSIS — R293 Abnormal posture: Secondary | ICD-10-CM | POA: Diagnosis not present

## 2022-11-06 DIAGNOSIS — R278 Other lack of coordination: Secondary | ICD-10-CM | POA: Diagnosis not present

## 2022-11-06 DIAGNOSIS — R262 Difficulty in walking, not elsewhere classified: Secondary | ICD-10-CM

## 2022-11-06 DIAGNOSIS — M25551 Pain in right hip: Secondary | ICD-10-CM | POA: Diagnosis not present

## 2022-11-06 DIAGNOSIS — M6281 Muscle weakness (generalized): Secondary | ICD-10-CM | POA: Diagnosis not present

## 2022-11-06 DIAGNOSIS — G8929 Other chronic pain: Secondary | ICD-10-CM | POA: Diagnosis not present

## 2022-11-06 DIAGNOSIS — M546 Pain in thoracic spine: Secondary | ICD-10-CM | POA: Diagnosis not present

## 2022-11-06 DIAGNOSIS — M545 Low back pain, unspecified: Secondary | ICD-10-CM | POA: Diagnosis not present

## 2022-11-06 NOTE — Therapy (Signed)
OUTPATIENT PHYSICAL THERAPY NEURO TREATMENT NOTE    Patient Name: Daniel [name] MRN: [id number] DOB:02-Aug-1950, 72 y.o., male Today's Date: 11/06/2022   PCP:    Denzil Magnuson   REFERRING PROVIDER: Lonell Face, MD   END OF SESSION:  PT End of Session - 11/06/22 1729     Visit Number 12    Number of Visits 24    Date for PT Re-Evaluation 12/19/22    Progress Note Due on Visit 10    PT Start Time 1646    PT Stop Time 1728    PT Time Calculation (min) 42 min    Equipment Utilized During Treatment Gait belt    Activity Tolerance Patient tolerated treatment well    Behavior During Therapy WFL for tasks assessed/performed                    Past Medical History:  Diagnosis Date   Actinic keratosis    Asthma    Eczema    Hx of dysplastic nevus 06/06/2006   Mid lower back, slight atypia   Hypothyroidism    Seasonal allergies    Thyroid disease    Past Surgical History:  Procedure Laterality Date   CATARACT EXTRACTION     COLON SURGERY     COLONOSCOPY WITH PROPOFOL N/A 11/18/2014   Procedure: COLONOSCOPY WITH PROPOFOL;  Surgeon: Scot Jun, MD;  Location: [facility] ENDOSCOPY;  Service: Endoscopy;  Laterality: N/A;   COLONOSCOPY WITH PROPOFOL N/A 12/13/2021   Procedure: COLONOSCOPY WITH PROPOFOL;  Surgeon: Regis Bill, MD;  Location: [facility] ENDOSCOPY;  Service: Endoscopy;  Laterality: N/A;   EYE SURGERY     KNEE SURGERY     NASAL SEPTUM SURGERY     Patient Active Problem List   Diagnosis Date Noted   Parkinson's disease 09/26/2022   Chronic idiopathic constipation 06/05/2022   Left lower quadrant abdominal pain 06/05/2022   Paroxysmal atrial fibrillation (HCC) 03/28/2022   Idiopathic hypotension 03/28/2022   Spondylolysis, lumbar region 03/28/2022   Avitaminosis D 03/28/2022   Myalgia due to statin 03/28/2022   Acquired thrombophilia (HCC) 03/28/2022   Hyperlipidemia 03/28/2022   Abnormal gait 03/28/2022   Mild intermittent asthma  without complication 03/28/2022   Zenker's (hypopharyngeal) diverticulum 03/22/2018   Gastroesophageal reflux disease 03/22/2018   Fatty liver 10/09/2017   BPH (benign prostatic hyperplasia) 11/21/2014   Hypothyroidism 11/21/2014   Inguinal hernia 11/21/2014   OA (osteoarthritis) 11/21/2014   H/O adenomatous polyp of colon 10/14/2014    ONSET DATE: 04/28/2022 for PD, back pain began around 09/10/21   REFERRING DIAG:  G20.A1 (ICD-10-CM) - Parkinson's disease  M54.9 (ICD-10-CM) - Back pain    THERAPY DIAG:  Other lack of coordination  Difficulty in walking, not elsewhere classified  Unsteadiness on feet  Muscle weakness (generalized)  Abnormal posture  Rationale for Evaluation and Treatment: Rehabilitation  SUBJECTIVE:  SUBJECTIVE STATEMENT:  Pt reports best day for his back in a while this weekend. No other updates   Pt accompanied by: self  PERTINENT HISTORY: Per [name] PT from 09/07/22 PT discharge from Low back treatment for focussed on PD related impairments at our clinic "Patient has attended 40 physical therapy treatment sessions since starting current episode of care on 12/20/2021. Patient at first struggled to make progress towards goals and has struggled at times with consistent attendance. However, over the past few weeks he has been improving with multi-discipline interventions including medications and increasing overall activity. It was also recently confirmed he has Parkinson's that is likely contributing to some of his abnormal posture and difficulty with full ROM during movements. Patient is being discharged today due to improvement in condition and so he can start PT with focus on rehabilitation techniques for Parkinson's. Patient was provided with a long term HEP to help him  with long term management of low back pain."  PAIN:  Are you having pain? Yes: NPRS scale: 0/10 Pain location: low back  Pain description: tight and achy  Aggravating factors: when laying down or getting up from sleep  Relieving factors: PT exercises, movement   PRECAUTIONS: None  WEIGHT BEARING RESTRICTIONS: No  FALLS: Has patient fallen in last 6 months? No  LIVING ENVIRONMENT: Lives with: lives alone Lives in: House/apartment Stairs: Yes: External: 5 steps; on left going up Has following equipment at home: None  PLOF: Independent  PATIENT GOALS: Improve his low back pain, improve posture, improve walking.   OBJECTIVE:   DIAGNOSTIC FINDINGS: MRI of L spine 07/12/21: Degenerative disc disease throughout the lumbar region with loss of disc height. No apparent compressive stenosis of the canal. Discogenic endplate marrow changes without active edema at L2-3, L3-4 and L4-5, which could correlate with regional axial back pain.   Left lateral recess and foraminal narrowing at L2-3, left foraminal narrowing at L3-4, and bilateral lateral recess and foraminal narrowing at L4-5. None of the stenoses are severe or are seen to result in definite neural compression.   Shallow left posterolateral disc herniation at L1-2 with caudal migration of a tiny amount of disc material to the left of midline. This does not appear to cause any significant compressive effect upon the neural structures.  COGNITION: Overall cognitive status: Within functional limits for tasks assessed   SENSATION: WFL   POSTURE: rounded shoulders, forward head, and increased thoracic kyphosis  LOWER EXTREMITY ROM:   WNL LOWER EXTREMITY MMT:    MMT Right Eval Left Eval  Hip flexion 4+ 4+  Hip extension    Hip abduction 5 5  Hip adduction 5 5  Hip internal rotation    Hip external rotation    Knee flexion 5 5  Knee extension 5 5  Ankle dorsiflexion 5 5  Ankle plantarflexion 5 5  Ankle inversion     Ankle eversion    (Blank rows = not tested)  BED MOBILITY: WNL    STAIRS: Level of Assistance: Complete Independence Stair Negotiation Technique: Alternating Pattern  with Bilateral Rails Number of Stairs: 4  Height of Stairs: 6    GAIT: Gait pattern:  forward posture and short step length bilateral Distance walked: 30 ft Assistive device utilized: None Level of assistance: Complete Independence   FUNCTIONAL TESTS:  5 times sit to stand: 13.07 6 minute walk test: 1414 ft  Mini Best Test:   PATIENT SURVEYS:  Modified Oswestry 10/50  FOTO 70, risk adjusted goal of  75     TODAY'S TREATMENT:                                                                                                                              DATE: 11/06/22  NMR:   Large amplitude arm-swing with gait 1x148 ft. Minimal arm swing throughout Large amplitude arm-swing with gait with PVC in each UE - 2x148, pt additionally wearing 1.5# weights on UE --Pt then repeats without PVC but still with weights 2x148 ft. Improved maintenance of arm-swing  In long hallway: Large amplitude arm-swing with gait with PVC in each UE, pt additionally wearing 1.5# weights on UE 2x80 ft --then 2x80 ft without PVC but still UE weights Large amplitude arm-swing with gait with PVC in each UE, pt additionally wearing 1.5# weights on UE 2x80 ft, addition of dual cog task --then pt repeats without PVC but still with weights and dual cog task - improved ability to sustain arm-swing   1.5# AW donned to each wrist  Large amplitude movement: -Lunge position with FWD/BCKWD weight shift with alt UE reach -  2x15 sets   each LE position. Mild unsteadiness but no significant LOB. -WBOS with BUE shoulder abduction and twist 2x15 each way -LTL step with UE abduction (bilat) then twist and back to starting position 15x each way -STS with forward reach and BUE shoulder abduction 14x, 10x. Fatiguing   PATIENT EDUCATION: Education  details: Pt educated throughout session about proper posture and technique with exercises. Improved exercise technique, movement at target joints, use of target muscles after min to mod verbal, visual, tactile cues.   Person educated: Patient Education method: Explanation Education comprehension: verbalized understanding  HOME EXERCISE PROGRAM:Pt to continue HEP as listed below Seated PWR! Moves handout gone over and provided.  Standing PWR! Moves handout reviewed and provided.  All fours modified to chair UE support with PWR! moves handout reviewed and provided   S: Pt reports no pain.   GOALS: Goals reviewed with patient? Yes  SHORT TERM GOALS: Target date: 10/24/2022       Patient will be independent in progressive home exercise program for PD to improve strength/mobility for better functional independence with ADLs. Baseline: No HEP for PD; 09/28/2022 per previous note pt currently perform PWR interventions as part of HEP; 10/31/22: Pt reports he feels comfortable with his HEP Goal status: MET   LONG TERM GOALS: Target date: 12/19/2022    1.  Patient  will complete five times sit to stand test in < 10 seconds indicating an increased LE strength and improved balance. Baseline: 13.07; 10/31/22 10.4 seconds hands-free  Goal status: PARTIALLY MET   2.  Patient will increase FOTO score to equal to or greater than  75   to demonstrate statistically significant improvement in mobility and quality of life.  Baseline: 70; 10/31/2022: 71 Goal status: ONGOING   3.  Patient will increase Mini best Balance score by > 4 points to  demonstrate decreased fall risk during functional activities. Baseline: 19; 10/31/22: 23/28 Goal status: PARTIALLY MET   4.   Patient will reduce dual task timed up and go to <11 seconds to reduce fall risk and demonstrate improved transfer/gait ability. Baseline: 12.97l 10/31/22: 8 sec Goal status: MET  5.   Patient will increase six minute walk test distance to  >1000 for progression to community ambulator and improve gait ability Baseline: Test visit 2 09/28/2022: 1414 ft; 10/31/2022: 1628 ft (previously met) Goal status: REVISED/MET 09/28/2022  6. Pt will improve modified oswestry score by 5 points or greater in order to indicate improved function in relation to his back pain   Baseline: 10/50; 11/01/22: 60%   Goal status: MET    ASSESSMENT:  CLINICAL IMPRESSION: No hip pain with interventions this visit. Pt initially had difficulty sustaining large-amplitude arm-swing with gait activities, but showed within session improvement following cues by completing gait activity with dual cog task and sustained arm-swing. Pt will continue to benefit from skilled physical therapy intervention to address impairments, improve QOL, and attain therapy goals.    OBJECTIVE IMPAIRMENTS: Abnormal gait, decreased balance, decreased endurance, decreased mobility, difficulty walking, and hypomobility.   ACTIVITY LIMITATIONS: carrying, lifting, bending, standing, squatting, stairs, and locomotion level  PARTICIPATION LIMITATIONS: community activity  PERSONAL FACTORS: Age and 1-2 comorbidities: Asthma  are also affecting patient's functional outcome.   REHAB POTENTIAL: Good  CLINICAL DECISION MAKING: Stable/uncomplicated  EVALUATION COMPLEXITY: Low  PLAN:  PT FREQUENCY: 2x/week  PT DURATION: 12 weeks  PLANNED INTERVENTIONS: Therapeutic exercises, Therapeutic activity, Neuromuscular re-education, Balance training, Gait training, Patient/Family education, Self Care, Joint mobilization, Joint manipulation, Stair training, Dry Needling, Spinal mobilization, Cryotherapy, Moist heat, and Manual therapy  PLAN FOR NEXT SESSION:  balance, low back pain, and PD specific impairments, continue plan    Baird Kay, PT 11/06/2022, 5:31 PM

## 2022-11-07 NOTE — Therapy (Signed)
OUTPATIENT PHYSICAL THERAPY NEURO TREATMENT NOTE    Patient Name: Daniel [name] MRN: [id number] DOB:08/19/1950, 72 y.o., male Today's Date: 11/08/2022   PCP:    Denzil Magnuson   REFERRING PROVIDER: Lonell Face, MD   END OF SESSION:  PT End of Session - 11/08/22 1542     Visit Number 13    Number of Visits 24    Date for PT Re-Evaluation 12/19/22    Progress Note Due on Visit 10    PT Start Time 1545    PT Stop Time 1630    PT Time Calculation (min) 45 min    Equipment Utilized During Treatment Gait belt    Activity Tolerance Patient tolerated treatment well    Behavior During Therapy [facility] for tasks assessed/performed                    Past Medical History:  Diagnosis Date   Actinic keratosis    Asthma    Eczema    Hx of dysplastic nevus 06/06/2006   Mid lower back, slight atypia   Hypothyroidism    Seasonal allergies    Thyroid disease    Past Surgical History:  Procedure Laterality Date   CATARACT EXTRACTION     COLON SURGERY     COLONOSCOPY WITH PROPOFOL N/A 11/18/2014   Procedure: COLONOSCOPY WITH PROPOFOL;  Surgeon: Scot Jun, MD;  Location: [facility] ENDOSCOPY;  Service: Endoscopy;  Laterality: N/A;   COLONOSCOPY WITH PROPOFOL N/A 12/13/2021   Procedure: COLONOSCOPY WITH PROPOFOL;  Surgeon: Regis Bill, MD;  Location: [facility] ENDOSCOPY;  Service: Endoscopy;  Laterality: N/A;   EYE SURGERY     KNEE SURGERY     NASAL SEPTUM SURGERY     Patient Active Problem List   Diagnosis Date Noted   Parkinson's disease 09/26/2022   Chronic idiopathic constipation 06/05/2022   Left lower quadrant abdominal pain 06/05/2022   Paroxysmal atrial fibrillation (HCC) 03/28/2022   Idiopathic hypotension 03/28/2022   Spondylolysis, lumbar region 03/28/2022   Avitaminosis D 03/28/2022   Myalgia due to statin 03/28/2022   Acquired thrombophilia (HCC) 03/28/2022   Hyperlipidemia 03/28/2022   Abnormal gait 03/28/2022   Mild intermittent asthma  without complication 03/28/2022   Zenker's (hypopharyngeal) diverticulum 03/22/2018   Gastroesophageal reflux disease 03/22/2018   Fatty liver 10/09/2017   BPH (benign prostatic hyperplasia) 11/21/2014   Hypothyroidism 11/21/2014   Inguinal hernia 11/21/2014   OA (osteoarthritis) 11/21/2014   H/O adenomatous polyp of colon 10/14/2014    ONSET DATE: 04/28/2022 for PD, back pain began around 09/10/21   REFERRING DIAG:  G20.A1 (ICD-10-CM) - Parkinson's disease  M54.9 (ICD-10-CM) - Back pain    THERAPY DIAG:  Difficulty in walking, not elsewhere classified  Unsteadiness on feet  Muscle weakness (generalized)  Abnormal posture  Rationale for Evaluation and Treatment: Rehabilitation  SUBJECTIVE:  SUBJECTIVE STATEMENT:  Patient forgot his knee brace, is bothering him today. Reports knee is a 3-4/10 at rest.    Pt accompanied by: self  PERTINENT HISTORY: Per [name] PT from 09/07/22 PT discharge from Low back treatment for focussed on PD related impairments at our clinic "Patient has attended 40 physical therapy treatment sessions since starting current episode of care on 12/20/2021. Patient at first struggled to make progress towards goals and has struggled at times with consistent attendance. However, over the past few weeks he has been improving with multi-discipline interventions including medications and increasing overall activity. It was also recently confirmed he has Parkinson's that is likely contributing to some of his abnormal posture and difficulty with full ROM during movements. Patient is being discharged today due to improvement in condition and so he can start PT with focus on rehabilitation techniques for Parkinson's. Patient was provided with a long term HEP to help him with long term  management of low back pain."  PAIN:  Are you having pain? Yes: NPRS scale: 0/10 Pain location: low back  Pain description: tight and achy  Aggravating factors: when laying down or getting up from sleep  Relieving factors: PT exercises, movement   PRECAUTIONS: None  WEIGHT BEARING RESTRICTIONS: No  FALLS: Has patient fallen in last 6 months? No  LIVING ENVIRONMENT: Lives with: lives alone Lives in: House/apartment Stairs: Yes: External: 5 steps; on left going up Has following equipment at home: None  PLOF: Independent  PATIENT GOALS: Improve his low back pain, improve posture, improve walking.   OBJECTIVE:   DIAGNOSTIC FINDINGS: MRI of L spine 07/12/21: Degenerative disc disease throughout the lumbar region with loss of disc height. No apparent compressive stenosis of the canal. Discogenic endplate marrow changes without active edema at L2-3, L3-4 and L4-5, which could correlate with regional axial back pain.   Left lateral recess and foraminal narrowing at L2-3, left foraminal narrowing at L3-4, and bilateral lateral recess and foraminal narrowing at L4-5. None of the stenoses are severe or are seen to result in definite neural compression.   Shallow left posterolateral disc herniation at L1-2 with caudal migration of a tiny amount of disc material to the left of midline. This does not appear to cause any significant compressive effect upon the neural structures.  COGNITION: Overall cognitive status: Within functional limits for tasks assessed   SENSATION: WFL   POSTURE: rounded shoulders, forward head, and increased thoracic kyphosis  LOWER EXTREMITY ROM:   WNL LOWER EXTREMITY MMT:    MMT Right Eval Left Eval  Hip flexion 4+ 4+  Hip extension    Hip abduction 5 5  Hip adduction 5 5  Hip internal rotation    Hip external rotation    Knee flexion 5 5  Knee extension 5 5  Ankle dorsiflexion 5 5  Ankle plantarflexion 5 5  Ankle inversion    Ankle  eversion    (Blank rows = not tested)  BED MOBILITY: WNL    STAIRS: Level of Assistance: Complete Independence Stair Negotiation Technique: Alternating Pattern  with Bilateral Rails Number of Stairs: 4  Height of Stairs: 6    GAIT: Gait pattern:  forward posture and short step length bilateral Distance walked: 30 ft Assistive device utilized: None Level of assistance: Complete Independence   FUNCTIONAL TESTS:  5 times sit to stand: 13.07 6 minute walk test: 1414 ft  Mini Best Test:   PATIENT SURVEYS:  Modified Oswestry 10/50  FOTO 70, risk adjusted  goal of 75     TODAY'S TREATMENT:                                                                                                                              DATE: 11/08/22  NMR:  In long hallway: Large amplitude arm-swing with gait with #1.5 AW on each arm, 160 ft   Large step over line and clap, return to standing position 10x each side  Standing with CGA next to support surface:  Airex pad: static stand 30 seconds x 2 trials eyes closed, noticeable trembling of ankles/LE's with fatigue and challenge to maintain stability Airex pad: horizontal head turns 30 seconds scanning room 10x ; cueing for arc of motion  Airex pad: vertical head turns 30 seconds, cueing for arc of motion, noticeable sway with upward gaze increasing demand on ankle righting reaction musculature Airex pad: one foot on dynadisc one foot on airex pad, hold position for 30 seconds, switch legs, 2x each LE;  Airex: dual task with reaching and visual scan playing word game 5 min 12 seconds Speed ladder: One foot each square: large arm swings x 10 length Lateral step one foot each square large arm swings x10 length cue for sprinter arm swing   Single limb stance 30 seconds x2 trials each LE  TherEx: Lean forward reach for ball, stand up and toss into hoop and sit back down 10x Seated LAQ with adduction ball squeeze 10x   PATIENT  EDUCATION: Education details: Pt educated throughout session about proper posture and technique with exercises. Improved exercise technique, movement at target joints, use of target muscles after min to mod verbal, visual, tactile cues.   Person educated: Patient Education method: Explanation Education comprehension: verbalized understanding  HOME EXERCISE PROGRAM:Pt to continue HEP as listed below Seated PWR! Moves handout gone over and provided.  Standing PWR! Moves handout reviewed and provided.  All fours modified to chair UE support with PWR! moves handout reviewed and provided   S: Pt reports no pain.   GOALS: Goals reviewed with patient? Yes  SHORT TERM GOALS: Target date: 10/24/2022       Patient will be independent in progressive home exercise program for PD to improve strength/mobility for better functional independence with ADLs. Baseline: No HEP for PD; 09/28/2022 per previous note pt currently perform PWR interventions as part of HEP; 10/31/22: Pt reports he feels comfortable with his HEP Goal status: MET   LONG TERM GOALS: Target date: 12/19/2022    1.  Patient  will complete five times sit to stand test in < 10 seconds indicating an increased LE strength and improved balance. Baseline: 13.07; 10/31/22 10.4 seconds hands-free  Goal status: PARTIALLY MET   2.  Patient will increase FOTO score to equal to or greater than  75   to demonstrate statistically significant improvement in mobility and quality of life.  Baseline: 70; 10/31/2022: 71 Goal status: ONGOING   3.  Patient  will increase Mini best Balance score by > 4 points to demonstrate decreased fall risk during functional activities. Baseline: 19; 10/31/22: 23/28 Goal status: PARTIALLY MET   4.   Patient will reduce dual task timed up and go to <11 seconds to reduce fall risk and demonstrate improved transfer/gait ability. Baseline: 12.97l 10/31/22: 8 sec Goal status: MET  5.   Patient will increase six minute  walk test distance to >1000 for progression to community ambulator and improve gait ability Baseline: Test visit 2 09/28/2022: 1414 ft; 10/31/2022: 1628 ft (previously met) Goal status: REVISED/MET 09/28/2022  6. Pt will improve modified oswestry score by 5 points or greater in order to indicate improved function in relation to his back pain   Baseline: 10/50; 11/01/22: 60%   Goal status: MET    ASSESSMENT:  CLINICAL IMPRESSION: Patient's ambulation limited by knee pain, patient forgot his brace today. Patient is motivated despite pain and tolerates stability and coordination tasks. Sit to stands do increase pain but pain stops once patient sits. Patient is limited in single limb stance at this time and will continue to benefit from stability interventions. Pt will continue to benefit from skilled physical therapy intervention to address impairments, improve QOL, and attain therapy goals.    OBJECTIVE IMPAIRMENTS: Abnormal gait, decreased balance, decreased endurance, decreased mobility, difficulty walking, and hypomobility.   ACTIVITY LIMITATIONS: carrying, lifting, bending, standing, squatting, stairs, and locomotion level  PARTICIPATION LIMITATIONS: community activity  PERSONAL FACTORS: Age and 1-2 comorbidities: Asthma  are also affecting patient's functional outcome.   REHAB POTENTIAL: Good  CLINICAL DECISION MAKING: Stable/uncomplicated  EVALUATION COMPLEXITY: Low  PLAN:  PT FREQUENCY: 2x/week  PT DURATION: 12 weeks  PLANNED INTERVENTIONS: Therapeutic exercises, Therapeutic activity, Neuromuscular re-education, Balance training, Gait training, Patient/Family education, Self Care, Joint mobilization, Joint manipulation, Stair training, Dry Needling, Spinal mobilization, Cryotherapy, Moist heat, and Manual therapy  PLAN FOR NEXT SESSION:  balance, low back pain, and PD specific impairments, continue plan    Precious Bard, PT 11/08/2022, 4:44 PM

## 2022-11-08 ENCOUNTER — Ambulatory Visit: Payer: Medicare Other

## 2022-11-08 DIAGNOSIS — R278 Other lack of coordination: Secondary | ICD-10-CM | POA: Diagnosis not present

## 2022-11-08 DIAGNOSIS — M545 Low back pain, unspecified: Secondary | ICD-10-CM | POA: Diagnosis not present

## 2022-11-08 DIAGNOSIS — R293 Abnormal posture: Secondary | ICD-10-CM | POA: Diagnosis not present

## 2022-11-08 DIAGNOSIS — M5459 Other low back pain: Secondary | ICD-10-CM | POA: Diagnosis not present

## 2022-11-08 DIAGNOSIS — R262 Difficulty in walking, not elsewhere classified: Secondary | ICD-10-CM

## 2022-11-08 DIAGNOSIS — G8929 Other chronic pain: Secondary | ICD-10-CM | POA: Diagnosis not present

## 2022-11-08 DIAGNOSIS — M6281 Muscle weakness (generalized): Secondary | ICD-10-CM | POA: Diagnosis not present

## 2022-11-08 DIAGNOSIS — M25551 Pain in right hip: Secondary | ICD-10-CM | POA: Diagnosis not present

## 2022-11-08 DIAGNOSIS — M546 Pain in thoracic spine: Secondary | ICD-10-CM | POA: Diagnosis not present

## 2022-11-08 DIAGNOSIS — R2681 Unsteadiness on feet: Secondary | ICD-10-CM | POA: Diagnosis not present

## 2022-11-13 ENCOUNTER — Ambulatory Visit: Payer: Medicare Other

## 2022-11-14 NOTE — Therapy (Signed)
OUTPATIENT PHYSICAL THERAPY NEURO TREATMENT NOTE    Patient Name: Daniel [name] MRN: [id number] DOB:Feb 17, 1951, 72 y.o., male Today's Date: 11/15/2022   PCP:    Denzil Magnuson   REFERRING PROVIDER: Lonell Face, MD   END OF SESSION:   Past Medical History:  Diagnosis Date   Actinic keratosis    Asthma    Eczema    Hx of dysplastic nevus 06/06/2006   Mid lower back, slight atypia   Hypothyroidism    Seasonal allergies    Thyroid disease    Past Surgical History:  Procedure Laterality Date   CATARACT EXTRACTION     COLON SURGERY     COLONOSCOPY WITH PROPOFOL N/A 11/18/2014   Procedure: COLONOSCOPY WITH PROPOFOL;  Surgeon: Scot Jun, MD;  Location: [facility] ENDOSCOPY;  Service: Endoscopy;  Laterality: N/A;   COLONOSCOPY WITH PROPOFOL N/A 12/13/2021   Procedure: COLONOSCOPY WITH PROPOFOL;  Surgeon: Regis Bill, MD;  Location: [facility] ENDOSCOPY;  Service: Endoscopy;  Laterality: N/A;   EYE SURGERY     KNEE SURGERY     NASAL SEPTUM SURGERY     Patient Active Problem List   Diagnosis Date Noted   Parkinson's disease 09/26/2022   Chronic idiopathic constipation 06/05/2022   Left lower quadrant abdominal pain 06/05/2022   Paroxysmal atrial fibrillation (HCC) 03/28/2022   Idiopathic hypotension 03/28/2022   Spondylolysis, lumbar region 03/28/2022   Avitaminosis D 03/28/2022   Myalgia due to statin 03/28/2022   Acquired thrombophilia (HCC) 03/28/2022   Hyperlipidemia 03/28/2022   Abnormal gait 03/28/2022   Mild intermittent asthma without complication 03/28/2022   Zenker's (hypopharyngeal) diverticulum 03/22/2018   Gastroesophageal reflux disease 03/22/2018   Fatty liver 10/09/2017   BPH (benign prostatic hyperplasia) 11/21/2014   Hypothyroidism 11/21/2014   Inguinal hernia 11/21/2014   OA (osteoarthritis) 11/21/2014   H/O adenomatous polyp of colon 10/14/2014    ONSET DATE: 04/28/2022 for PD, back pain began around 09/10/21   REFERRING DIAG:   G20.A1 (ICD-10-CM) - Parkinson's disease  M54.9 (ICD-10-CM) - Back pain    THERAPY DIAG:  Difficulty in walking, not elsewhere classified  Unsteadiness on feet  Muscle weakness (generalized)  Other lack of coordination  Rationale for Evaluation and Treatment: Rehabilitation  SUBJECTIVE:                                                                                                                                                                                             SUBJECTIVE STATEMENT:  My back was hurting over the weekend after weeding, laundry, and walking.       Pt accompanied by: self  PERTINENT HISTORY: Per [name] PT from 09/07/22 PT discharge from Low back treatment for focussed on PD related impairments at our clinic "Patient has attended 40 physical therapy treatment sessions since starting current episode of care on 12/20/2021. Patient at first struggled to make progress towards goals and has struggled at times with consistent attendance. However, over the past few weeks he has been improving with multi-discipline interventions including medications and increasing overall activity. It was also recently confirmed he has Parkinson's that is likely contributing to some of his abnormal posture and difficulty with full ROM during movements. Patient is being discharged today due to improvement in condition and so he can start PT with focus on rehabilitation techniques for Parkinson's. Patient was provided with a long term HEP to help him with long term management of low back pain."  PAIN:  Are you having pain? Yes: NPRS scale: 0/10 Pain location: low back  Pain description: tight and achy  Aggravating factors: when laying down or getting up from sleep  Relieving factors: PT exercises, movement   PRECAUTIONS: None  WEIGHT BEARING RESTRICTIONS: No  FALLS: Has patient fallen in last 6 months? No  LIVING ENVIRONMENT: Lives with: lives alone Lives in:  House/apartment Stairs: Yes: External: 5 steps; on left going up Has following equipment at home: None  PLOF: Independent  PATIENT GOALS: Improve his low back pain, improve posture, improve walking.   OBJECTIVE:   DIAGNOSTIC FINDINGS: MRI of L spine 07/12/21: Degenerative disc disease throughout the lumbar region with loss of disc height. No apparent compressive stenosis of the canal. Discogenic endplate marrow changes without active edema at L2-3, L3-4 and L4-5, which could correlate with regional axial back pain.   Left lateral recess and foraminal narrowing at L2-3, left foraminal narrowing at L3-4, and bilateral lateral recess and foraminal narrowing at L4-5. None of the stenoses are severe or are seen to result in definite neural compression.   Shallow left posterolateral disc herniation at L1-2 with caudal migration of a tiny amount of disc material to the left of midline. This does not appear to cause any significant compressive effect upon the neural structures.  COGNITION: Overall cognitive status: Within functional limits for tasks assessed   SENSATION: WFL   POSTURE: rounded shoulders, forward head, and increased thoracic kyphosis  LOWER EXTREMITY ROM:   WNL LOWER EXTREMITY MMT:    MMT Right Eval Left Eval  Hip flexion 4+ 4+  Hip extension    Hip abduction 5 5  Hip adduction 5 5  Hip internal rotation    Hip external rotation    Knee flexion 5 5  Knee extension 5 5  Ankle dorsiflexion 5 5  Ankle plantarflexion 5 5  Ankle inversion    Ankle eversion    (Blank rows = not tested)  BED MOBILITY: WNL    STAIRS: Level of Assistance: Complete Independence Stair Negotiation Technique: Alternating Pattern  with Bilateral Rails Number of Stairs: 4  Height of Stairs: 6    GAIT: Gait pattern:  forward posture and short step length bilateral Distance walked: 30 ft Assistive device utilized: None Level of assistance: Complete  Independence   FUNCTIONAL TESTS:  5 times sit to stand: 13.07 6 minute walk test: 1414 ft  Mini Best Test:   PATIENT SURVEYS:  Modified Oswestry 10/50  FOTO 70, risk adjusted goal of 75     TODAY'S TREATMENT:  DATE: 11/15/22  Unless otherwise stated, CGA was provided and gait belt donned in order to ensure pt safety   NMRE: (30 min) - 7.5# x 3 B: lateral stepping in cable harness, with 2nd therapist to toss ball with for dual task and coordination during dynamic task; continuous VC for picking up feet during L side stepping  -Kore Balance penguin game for ankle strategy x 2 rounds. 16 fish both trials.  -4 x 150': ambulation with emphasis on large movements for endurance with task  -trial 3-4: red light / green light for acceleration  /deceleration training  Pt needed continuous cues for arm swing  -2 x 150': ambulation with PVC pipe; one in each arm for large arm swing     PATIENT EDUCATION: Education details: Pt educated throughout session about proper posture and technique with exercises. Improved exercise technique, movement at target joints, use of target muscles after min to mod verbal, visual, tactile cues.   Person educated: Patient Education method: Explanation Education comprehension: verbalized understanding  HOME EXERCISE PROGRAM:Pt to continue HEP as listed below Seated PWR! Moves handout gone over and provided.  Standing PWR! Moves handout reviewed and provided.  All fours modified to chair UE support with PWR! moves handout reviewed and provided   S: Pt reports no pain.   GOALS: Goals reviewed with patient? Yes  SHORT TERM GOALS: Target date: 10/24/2022       Patient will be independent in progressive home exercise program for PD to improve strength/mobility for better functional independence with ADLs. Baseline: No HEP for  PD; 09/28/2022 per previous note pt currently perform PWR interventions as part of HEP; 10/31/22: Pt reports he feels comfortable with his HEP Goal status: MET   LONG TERM GOALS: Target date: 12/19/2022    1.  Patient  will complete five times sit to stand test in < 10 seconds indicating an increased LE strength and improved balance. Baseline: 13.07; 10/31/22 10.4 seconds hands-free  Goal status: PARTIALLY MET   2.  Patient will increase FOTO score to equal to or greater than  75   to demonstrate statistically significant improvement in mobility and quality of life.  Baseline: 70; 10/31/2022: 71 Goal status: ONGOING   3.  Patient will increase Mini best Balance score by > 4 points to demonstrate decreased fall risk during functional activities. Baseline: 19; 10/31/22: 23/28 Goal status: PARTIALLY MET   4.   Patient will reduce dual task timed up and go to <11 seconds to reduce fall risk and demonstrate improved transfer/gait ability. Baseline: 12.97l 10/31/22: 8 sec Goal status: MET  5.   Patient will increase six minute walk test distance to >1000 for progression to community ambulator and improve gait ability Baseline: Test visit 2 09/28/2022: 1414 ft; 10/31/2022: 1628 ft (previously met) Goal status: REVISED/MET 09/28/2022  6. Pt will improve modified oswestry score by 5 points or greater in order to indicate improved function in relation to his back pain   Baseline: 10/50; 11/01/22: 60%   Goal status: MET    ASSESSMENT:  CLINICAL IMPRESSION: Patient reports his knee is getting better since last week, and stated his back hurt over the weekend from increased activity; denies LBP at this time. Session needed to be terminated 15 minutes early due to pt needing to pick up a family member. The session focused on foot clearance and arm swing during ambulation, and balancing tasks. The patient would benefit from continued implementation of large arm movements for carry over. Pt also  reported he finds  it hard to pick his feet up when walking on carpet at home, and could benefit from practicing this task.  Pt will continue to benefit from skilled physical therapy intervention to address impairments, improve QOL, and attain therapy goals.    OBJECTIVE IMPAIRMENTS: Abnormal gait, decreased balance, decreased endurance, decreased mobility, difficulty walking, and hypomobility.   ACTIVITY LIMITATIONS: carrying, lifting, bending, standing, squatting, stairs, and locomotion level  PARTICIPATION LIMITATIONS: community activity  PERSONAL FACTORS: Age and 1-2 comorbidities: Asthma  are also affecting patient's functional outcome.   REHAB POTENTIAL: Good  CLINICAL DECISION MAKING: Stable/uncomplicated  EVALUATION COMPLEXITY: Low  PLAN:  PT FREQUENCY: 2x/week  PT DURATION: 12 weeks  PLANNED INTERVENTIONS: Therapeutic exercises, Therapeutic activity, Neuromuscular re-education, Balance training, Gait training, Patient/Family education, Self Care, Joint mobilization, Joint manipulation, Stair training, Dry Needling, Spinal mobilization, Cryotherapy, Moist heat, and Manual therapy  PLAN FOR NEXT SESSION:  balance, low back pain, and PD specific impairments, emphasis on large arm swing   Judith Blonder, SPT  This entire session was performed under direct supervision and direction of a licensed Estate agent . I have personally read, edited and approve of the note as written.  Precious Bard, PT 11/15/2022, 5:28 PM

## 2022-11-15 ENCOUNTER — Ambulatory Visit: Payer: Medicare Other

## 2022-11-15 DIAGNOSIS — M545 Low back pain, unspecified: Secondary | ICD-10-CM | POA: Diagnosis not present

## 2022-11-15 DIAGNOSIS — R2681 Unsteadiness on feet: Secondary | ICD-10-CM

## 2022-11-15 DIAGNOSIS — M6281 Muscle weakness (generalized): Secondary | ICD-10-CM | POA: Diagnosis not present

## 2022-11-15 DIAGNOSIS — M25551 Pain in right hip: Secondary | ICD-10-CM | POA: Diagnosis not present

## 2022-11-15 DIAGNOSIS — M5459 Other low back pain: Secondary | ICD-10-CM | POA: Diagnosis not present

## 2022-11-15 DIAGNOSIS — R278 Other lack of coordination: Secondary | ICD-10-CM | POA: Diagnosis not present

## 2022-11-15 DIAGNOSIS — R293 Abnormal posture: Secondary | ICD-10-CM | POA: Diagnosis not present

## 2022-11-15 DIAGNOSIS — R262 Difficulty in walking, not elsewhere classified: Secondary | ICD-10-CM | POA: Diagnosis not present

## 2022-11-15 DIAGNOSIS — G8929 Other chronic pain: Secondary | ICD-10-CM | POA: Diagnosis not present

## 2022-11-15 DIAGNOSIS — M546 Pain in thoracic spine: Secondary | ICD-10-CM | POA: Diagnosis not present

## 2022-11-17 ENCOUNTER — Encounter: Payer: Self-pay | Admitting: Family Medicine

## 2022-11-20 ENCOUNTER — Ambulatory Visit: Payer: Medicare Other

## 2022-11-20 DIAGNOSIS — M25551 Pain in right hip: Secondary | ICD-10-CM | POA: Diagnosis not present

## 2022-11-20 DIAGNOSIS — M546 Pain in thoracic spine: Secondary | ICD-10-CM

## 2022-11-20 DIAGNOSIS — R278 Other lack of coordination: Secondary | ICD-10-CM

## 2022-11-20 DIAGNOSIS — M545 Low back pain, unspecified: Secondary | ICD-10-CM | POA: Diagnosis not present

## 2022-11-20 DIAGNOSIS — R2681 Unsteadiness on feet: Secondary | ICD-10-CM

## 2022-11-20 DIAGNOSIS — R262 Difficulty in walking, not elsewhere classified: Secondary | ICD-10-CM

## 2022-11-20 DIAGNOSIS — R293 Abnormal posture: Secondary | ICD-10-CM | POA: Diagnosis not present

## 2022-11-20 DIAGNOSIS — M5459 Other low back pain: Secondary | ICD-10-CM

## 2022-11-20 DIAGNOSIS — G8929 Other chronic pain: Secondary | ICD-10-CM

## 2022-11-20 DIAGNOSIS — M6281 Muscle weakness (generalized): Secondary | ICD-10-CM

## 2022-11-20 NOTE — Therapy (Signed)
OUTPATIENT PHYSICAL THERAPY NEURO TREATMENT NOTE    Patient Name: Daniel [name] MRN: [id number] DOB:08/19/1950, 72 y.o., male Today's Date: 11/20/2022   PCP:    Denzil Magnuson   REFERRING PROVIDER: Lonell Face, MD   END OF SESSION:   Past Medical History:  Diagnosis Date   Actinic keratosis    Asthma    Eczema    Hx of dysplastic nevus 06/06/2006   Mid lower back, slight atypia   Hypothyroidism    Seasonal allergies    Thyroid disease    Past Surgical History:  Procedure Laterality Date   CATARACT EXTRACTION     COLON SURGERY     COLONOSCOPY WITH PROPOFOL N/A 11/18/2014   Procedure: COLONOSCOPY WITH PROPOFOL;  Surgeon: Scot Jun, MD;  Location: [facility] ENDOSCOPY;  Service: Endoscopy;  Laterality: N/A;   COLONOSCOPY WITH PROPOFOL N/A 12/13/2021   Procedure: COLONOSCOPY WITH PROPOFOL;  Surgeon: Regis Bill, MD;  Location: [facility] ENDOSCOPY;  Service: Endoscopy;  Laterality: N/A;   EYE SURGERY     KNEE SURGERY     NASAL SEPTUM SURGERY     Patient Active Problem List   Diagnosis Date Noted   Parkinson's disease 09/26/2022   Chronic idiopathic constipation 06/05/2022   Left lower quadrant abdominal pain 06/05/2022   Paroxysmal atrial fibrillation (HCC) 03/28/2022   Idiopathic hypotension 03/28/2022   Spondylolysis, lumbar region 03/28/2022   Avitaminosis D 03/28/2022   Myalgia due to statin 03/28/2022   Acquired thrombophilia (HCC) 03/28/2022   Hyperlipidemia 03/28/2022   Abnormal gait 03/28/2022   Mild intermittent asthma without complication 03/28/2022   Zenker's (hypopharyngeal) diverticulum 03/22/2018   Gastroesophageal reflux disease 03/22/2018   Fatty liver 10/09/2017   BPH (benign prostatic hyperplasia) 11/21/2014   Hypothyroidism 11/21/2014   Inguinal hernia 11/21/2014   OA (osteoarthritis) 11/21/2014   H/O adenomatous polyp of colon 10/14/2014    ONSET DATE: 04/28/2022 for PD, back pain began around 09/10/21   REFERRING DIAG:   G20.A1 (ICD-10-CM) - Parkinson's disease  M54.9 (ICD-10-CM) - Back pain    THERAPY DIAG:  Difficulty in walking, not elsewhere classified  Unsteadiness on feet  Muscle weakness (generalized)  Other lack of coordination  Abnormal posture  Pain in right hip  Chronic bilateral low back pain, unspecified whether sciatica present  Other low back pain  Pain in thoracic spine  Rationale for Evaluation and Treatment: Rehabilitation  SUBJECTIVE:  SUBJECTIVE STATEMENT:  Pt reports he had a family reunion this weekend that went well.    Pt accompanied by: self  PERTINENT HISTORY: Per [name] PT from 09/07/22 PT discharge from Low back treatment for focussed on PD related impairments at our clinic "Patient has attended 40 physical therapy treatment sessions since starting current episode of care on 12/20/2021. Patient at first struggled to make progress towards goals and has struggled at times with consistent attendance. However, over the past few weeks he has been improving with multi-discipline interventions including medications and increasing overall activity. It was also recently confirmed he has Parkinson's that is likely contributing to some of his abnormal posture and difficulty with full ROM during movements. Patient is being discharged today due to improvement in condition and so he can start PT with focus on rehabilitation techniques for Parkinson's. Patient was provided with a long term HEP to help him with long term management of low back pain."  PAIN:  Are you having pain? Yes: NPRS scale: 0/10 Pain location: low back  Pain description: tight and achy  Aggravating factors: when laying down or getting up from sleep  Relieving factors: PT exercises, movement   PRECAUTIONS:  None  WEIGHT BEARING RESTRICTIONS: No  FALLS: Has patient fallen in last 6 months? No  LIVING ENVIRONMENT: Lives with: lives alone Lives in: House/apartment Stairs: Yes: External: 5 steps; on left going up Has following equipment at home: None  PLOF: Independent  PATIENT GOALS: Improve his low back pain, improve posture, improve walking.   OBJECTIVE:   DIAGNOSTIC FINDINGS: MRI of L spine 07/12/21: Degenerative disc disease throughout the lumbar region with loss of disc height. No apparent compressive stenosis of the canal. Discogenic endplate marrow changes without active edema at L2-3, L3-4 and L4-5, which could correlate with regional axial back pain.   Left lateral recess and foraminal narrowing at L2-3, left foraminal narrowing at L3-4, and bilateral lateral recess and foraminal narrowing at L4-5. None of the stenoses are severe or are seen to result in definite neural compression.   Shallow left posterolateral disc herniation at L1-2 with caudal migration of a tiny amount of disc material to the left of midline. This does not appear to cause any significant compressive effect upon the neural structures.  COGNITION: Overall cognitive status: Within functional limits for tasks assessed   SENSATION: WFL   POSTURE: rounded shoulders, forward head, and increased thoracic kyphosis  LOWER EXTREMITY ROM:   WNL LOWER EXTREMITY MMT:    MMT Right Eval Left Eval  Hip flexion 4+ 4+  Hip extension    Hip abduction 5 5  Hip adduction 5 5  Hip internal rotation    Hip external rotation    Knee flexion 5 5  Knee extension 5 5  Ankle dorsiflexion 5 5  Ankle plantarflexion 5 5  Ankle inversion    Ankle eversion    (Blank rows = not tested)  BED MOBILITY: WNL    STAIRS: Level of Assistance: Complete Independence Stair Negotiation Technique: Alternating Pattern  with Bilateral Rails Number of Stairs: 4  Height of Stairs: 6    GAIT: Gait pattern:  forward  posture and short step length bilateral Distance walked: 30 ft Assistive device utilized: None Level of assistance: Complete Independence   FUNCTIONAL TESTS:  5 times sit to stand: 13.07 6 minute walk test: 1414 ft  Mini Best Test:   PATIENT SURVEYS:  Modified Oswestry 10/50  FOTO 70, risk adjusted goal of 75  TODAY'S TREATMENT: DATE: 11/20/22   Unless otherwise stated, CGA was provided and gait belt donned in order to ensure pt safety   TherEx:  Cable column walkouts, forward/latera/backward, 12.5#, x5 each direction Seated hip adduction into rainbow physioball, 3 sec holds, 2x15 Seated LAQ with 3# AW donned and rainbow physioball in between knees, 2x15 Seated marches with 3# AW donned, 2x15    NMRE: (30 min)  Korebalance Tux Racer, 2 Practice Rounds performed, Bronze set  Ambulation with emphasis on large movements for endurance with task, 2 total laps, 150'    PATIENT EDUCATION: Education details: Pt educated throughout session about proper posture and technique with exercises. Improved exercise technique, movement at target joints, use of target muscles after min to mod verbal, visual, tactile cues.   Person educated: Patient Education method: Explanation Education comprehension: verbalized understanding  HOME EXERCISE PROGRAM:Pt to continue HEP as listed below Seated PWR! Moves handout gone over and provided.  Standing PWR! Moves handout reviewed and provided.  All fours modified to chair UE support with PWR! moves handout reviewed and provided   S: Pt reports no pain.   GOALS: Goals reviewed with patient? Yes  SHORT TERM GOALS: Target date: 10/24/2022       Patient will be independent in progressive home exercise program for PD to improve strength/mobility for better functional independence with ADLs. Baseline: No HEP for PD; 09/28/2022 per previous note pt currently perform PWR interventions as part of HEP; 10/31/22: Pt reports he feels  comfortable with his HEP Goal status: MET   LONG TERM GOALS: Target date: 12/19/2022    1.  Patient  will complete five times sit to stand test in < 10 seconds indicating an increased LE strength and improved balance. Baseline: 13.07; 10/31/22 10.4 seconds hands-free  Goal status: PARTIALLY MET   2.  Patient will increase FOTO score to equal to or greater than  75   to demonstrate statistically significant improvement in mobility and quality of life.  Baseline: 70; 10/31/2022: 71 Goal status: ONGOING   3.  Patient will increase Mini best Balance score by > 4 points to demonstrate decreased fall risk during functional activities. Baseline: 19; 10/31/22: 23/28 Goal status: PARTIALLY MET   4.   Patient will reduce dual task timed up and go to <11 seconds to reduce fall risk and demonstrate improved transfer/gait ability. Baseline: 12.97l 10/31/22: 8 sec Goal status: MET  5.   Patient will increase six minute walk test distance to >1000 for progression to community ambulator and improve gait ability Baseline: Test visit 2 09/28/2022: 1414 ft; 10/31/2022: 1628 ft (previously met) Goal status: REVISED/MET 09/28/2022  6. Pt will improve modified oswestry score by 5 points or greater in order to indicate improved function in relation to his back pain   Baseline: 10/50; 11/01/22: 60%   Goal status: MET    ASSESSMENT:  CLINICAL IMPRESSION:  Pt performed well with all activities and was able to have good carryover with ambulation in the hallway with increased arm swing.  Pt did require verbal cuing, however was able to continue with carryover on second lap and no verbal cuing necessary.  Pt also demonstrated improved balance while performing the Tux Racer on the [place] machine.   Pt will continue to benefit from skilled therapy to address remaining deficits in order to improve overall QoL and return to PLOF.      OBJECTIVE IMPAIRMENTS: Abnormal gait, decreased balance, decreased endurance,  decreased mobility, difficulty walking, and hypomobility.  ACTIVITY LIMITATIONS: carrying, lifting, bending, standing, squatting, stairs, and locomotion level  PARTICIPATION LIMITATIONS: community activity  PERSONAL FACTORS: Age and 1-2 comorbidities: Asthma  are also affecting patient's functional outcome.   REHAB POTENTIAL: Good  CLINICAL DECISION MAKING: Stable/uncomplicated  EVALUATION COMPLEXITY: Low  PLAN:  PT FREQUENCY: 2x/week  PT DURATION: 12 weeks  PLANNED INTERVENTIONS: Therapeutic exercises, Therapeutic activity, Neuromuscular re-education, Balance training, Gait training, Patient/Family education, Self Care, Joint mobilization, Joint manipulation, Stair training, Dry Needling, Spinal mobilization, Cryotherapy, Moist heat, and Manual therapy  PLAN FOR NEXT SESSION:  balance, low back pain, and PD specific impairments, emphasis on large arm swing    Nolon Bussing, PT, DPT Physical Therapist - [facility]  [facility]  11/20/22, 5:10 PM

## 2022-11-22 ENCOUNTER — Ambulatory Visit: Payer: Medicare Other

## 2022-11-23 ENCOUNTER — Ambulatory Visit: Payer: Medicare Other

## 2022-11-23 DIAGNOSIS — R2681 Unsteadiness on feet: Secondary | ICD-10-CM

## 2022-11-23 DIAGNOSIS — M545 Low back pain, unspecified: Secondary | ICD-10-CM | POA: Diagnosis not present

## 2022-11-23 DIAGNOSIS — R293 Abnormal posture: Secondary | ICD-10-CM | POA: Diagnosis not present

## 2022-11-23 DIAGNOSIS — R262 Difficulty in walking, not elsewhere classified: Secondary | ICD-10-CM | POA: Diagnosis not present

## 2022-11-23 DIAGNOSIS — G8929 Other chronic pain: Secondary | ICD-10-CM | POA: Diagnosis not present

## 2022-11-23 DIAGNOSIS — R278 Other lack of coordination: Secondary | ICD-10-CM | POA: Diagnosis not present

## 2022-11-23 DIAGNOSIS — M6281 Muscle weakness (generalized): Secondary | ICD-10-CM

## 2022-11-23 DIAGNOSIS — M546 Pain in thoracic spine: Secondary | ICD-10-CM | POA: Diagnosis not present

## 2022-11-23 DIAGNOSIS — M5459 Other low back pain: Secondary | ICD-10-CM | POA: Diagnosis not present

## 2022-11-23 DIAGNOSIS — M25551 Pain in right hip: Secondary | ICD-10-CM | POA: Diagnosis not present

## 2022-11-23 NOTE — Therapy (Signed)
OUTPATIENT PHYSICAL THERAPY NEURO TREATMENT NOTE    Patient Name: Daniel [name] MRN: [id number] DOB:28-Oct-1950, 72 y.o., male Today's Date: 11/23/2022   PCP:    Denzil Magnuson   REFERRING PROVIDER: Lonell Face, MD   END OF SESSION:   Past Medical History:  Diagnosis Date   Actinic keratosis    Asthma    Eczema    Hx of dysplastic nevus 06/06/2006   Mid lower back, slight atypia   Hypothyroidism    Seasonal allergies    Thyroid disease    Past Surgical History:  Procedure Laterality Date   CATARACT EXTRACTION     COLON SURGERY     COLONOSCOPY WITH PROPOFOL N/A 11/18/2014   Procedure: COLONOSCOPY WITH PROPOFOL;  Surgeon: Scot Jun, MD;  Location: [facility] ENDOSCOPY;  Service: Endoscopy;  Laterality: N/A;   COLONOSCOPY WITH PROPOFOL N/A 12/13/2021   Procedure: COLONOSCOPY WITH PROPOFOL;  Surgeon: Regis Bill, MD;  Location: [facility] ENDOSCOPY;  Service: Endoscopy;  Laterality: N/A;   EYE SURGERY     KNEE SURGERY     NASAL SEPTUM SURGERY     Patient Active Problem List   Diagnosis Date Noted   Parkinson's disease 09/26/2022   Chronic idiopathic constipation 06/05/2022   Left lower quadrant abdominal pain 06/05/2022   Paroxysmal atrial fibrillation (HCC) 03/28/2022   Idiopathic hypotension 03/28/2022   Spondylolysis, lumbar region 03/28/2022   Avitaminosis D 03/28/2022   Myalgia due to statin 03/28/2022   Acquired thrombophilia (HCC) 03/28/2022   Hyperlipidemia 03/28/2022   Abnormal gait 03/28/2022   Mild intermittent asthma without complication 03/28/2022   Zenker's (hypopharyngeal) diverticulum 03/22/2018   Gastroesophageal reflux disease 03/22/2018   Fatty liver 10/09/2017   BPH (benign prostatic hyperplasia) 11/21/2014   Hypothyroidism 11/21/2014   Inguinal hernia 11/21/2014   OA (osteoarthritis) 11/21/2014   H/O adenomatous polyp of colon 10/14/2014    ONSET DATE: 04/28/2022 for PD, back pain began around 09/10/21   REFERRING DIAG:   G20.A1 (ICD-10-CM) - Parkinson's disease  M54.9 (ICD-10-CM) - Back pain    THERAPY DIAG:  Other lack of coordination  Unsteadiness on feet  Muscle weakness (generalized)  Abnormal posture  Rationale for Evaluation and Treatment: Rehabilitation  SUBJECTIVE:                                                                                                                                                                                             SUBJECTIVE STATEMENT:  Pt reports his hips are sore, his low back hurt this morning, but has since gone away. Pt wearing R knee brace. Pt with no other updates/concerns.  Pt accompanied by: self  PERTINENT HISTORY: Per [name] PT from 09/07/22 PT discharge from Low back treatment for focussed on PD related impairments at our clinic "Patient has attended 40 physical therapy treatment sessions since starting current episode of care on 12/20/2021. Patient at first struggled to make progress towards goals and has struggled at times with consistent attendance. However, over the past few weeks he has been improving with multi-discipline interventions including medications and increasing overall activity. It was also recently confirmed he has Parkinson's that is likely contributing to some of his abnormal posture and difficulty with full ROM during movements. Patient is being discharged today due to improvement in condition and so he can start PT with focus on rehabilitation techniques for Parkinson's. Patient was provided with a long term HEP to help him with long term management of low back pain."  PAIN:  Are you having pain? Yes: NPRS scale: 0/10 Pain location: low back  Pain description: tight and achy  Aggravating factors: when laying down or getting up from sleep  Relieving factors: PT exercises, movement   PRECAUTIONS: None  WEIGHT BEARING RESTRICTIONS: No  FALLS: Has patient fallen in last 6 months? No  LIVING ENVIRONMENT: Lives with:  lives alone Lives in: House/apartment Stairs: Yes: External: 5 steps; on left going up Has following equipment at home: None  PLOF: Independent  PATIENT GOALS: Improve his low back pain, improve posture, improve walking.   OBJECTIVE:   DIAGNOSTIC FINDINGS: MRI of L spine 07/12/21: Degenerative disc disease throughout the lumbar region with loss of disc height. No apparent compressive stenosis of the canal. Discogenic endplate marrow changes without active edema at L2-3, L3-4 and L4-5, which could correlate with regional axial back pain.   Left lateral recess and foraminal narrowing at L2-3, left foraminal narrowing at L3-4, and bilateral lateral recess and foraminal narrowing at L4-5. None of the stenoses are severe or are seen to result in definite neural compression.   Shallow left posterolateral disc herniation at L1-2 with caudal migration of a tiny amount of disc material to the left of midline. This does not appear to cause any significant compressive effect upon the neural structures.  COGNITION: Overall cognitive status: Within functional limits for tasks assessed   SENSATION: WFL   POSTURE: rounded shoulders, forward head, and increased thoracic kyphosis  LOWER EXTREMITY ROM:   WNL LOWER EXTREMITY MMT:    MMT Right Eval Left Eval  Hip flexion 4+ 4+  Hip extension    Hip abduction 5 5  Hip adduction 5 5  Hip internal rotation    Hip external rotation    Knee flexion 5 5  Knee extension 5 5  Ankle dorsiflexion 5 5  Ankle plantarflexion 5 5  Ankle inversion    Ankle eversion    (Blank rows = not tested)  BED MOBILITY: WNL    STAIRS: Level of Assistance: Complete Independence Stair Negotiation Technique: Alternating Pattern  with Bilateral Rails Number of Stairs: 4  Height of Stairs: 6    GAIT: Gait pattern:  forward posture and short step length bilateral Distance walked: 30 ft Assistive device utilized: None Level of assistance: Complete  Independence   FUNCTIONAL TESTS:  5 times sit to stand: 13.07 6 minute walk test: 1414 ft  Mini Best Test:   PATIENT SURVEYS:  Modified Oswestry 10/50  FOTO 70, risk adjusted goal of 75     TODAY'S TREATMENT: DATE: 11/23/22   Unless otherwise stated, CGA was provided and gait belt  donned in order to ensure pt safety   TherEx: Physioball rollouts FWD/BCKWD and LTL x 1 min for each Seated thoracic ext in chair 10x Seated trunk rotation 10x each direction   NMRE:   2.5# wrist weights donned: Ambulation with emphasis on large movements, arm-swing 2x148 without PVC, and with PVC each UE 2x148 ft, then again without PVC   1.5# AW donned to each wrist  Large amplitude movement: -STS with forward reach and BUE shoulder abduction 15x. Fatiguing   -Lunge position with FWD/BCKWD weight shift with alt UE reach -  1x15 each side    -Bow, step back with backward reach 15x each side   -WBOS with BUE shoulder abduction and twist 1x10 each way  -LTL step with UE abduction (bilat) then twist and back to starting position 2x15 each way, on blue mat    Korebalance Tux Racer - with fading levels of UE support 3x through, exhibits improved control/ability to weight-shift   STS with large-amplitude technique 10x with BUE abduction     PATIENT EDUCATION: Education details: Pt educated throughout session about proper posture and technique with exercises. Improved exercise technique, movement at target joints, use of target muscles after min to mod verbal, visual, tactile cues.   Person educated: Patient Education method: Explanation Education comprehension: verbalized understanding  HOME EXERCISE PROGRAM:Pt to continue HEP as listed below Seated PWR! Moves handout gone over and provided.  Standing PWR! Moves handout reviewed and provided.  All fours modified to chair UE support with PWR! moves handout reviewed and provided   S: Pt reports no pain.   GOALS: Goals reviewed  with patient? Yes  SHORT TERM GOALS: Target date: 10/24/2022       Patient will be independent in progressive home exercise program for PD to improve strength/mobility for better functional independence with ADLs. Baseline: No HEP for PD; 09/28/2022 per previous note pt currently perform PWR interventions as part of HEP; 10/31/22: Pt reports he feels comfortable with his HEP Goal status: MET   LONG TERM GOALS: Target date: 12/19/2022    1.  Patient  will complete five times sit to stand test in < 10 seconds indicating an increased LE strength and improved balance. Baseline: 13.07; 10/31/22 10.4 seconds hands-free  Goal status: PARTIALLY MET   2.  Patient will increase FOTO score to equal to or greater than  75   to demonstrate statistically significant improvement in mobility and quality of life.  Baseline: 70; 10/31/2022: 71 Goal status: ONGOING   3.  Patient will increase Mini best Balance score by > 4 points to demonstrate decreased fall risk during functional activities. Baseline: 19; 10/31/22: 23/28 Goal status: PARTIALLY MET   4.   Patient will reduce dual task timed up and go to <11 seconds to reduce fall risk and demonstrate improved transfer/gait ability. Baseline: 12.97l 10/31/22: 8 sec Goal status: MET  5.   Patient will increase six minute walk test distance to >1000 for progression to community ambulator and improve gait ability Baseline: Test visit 2 09/28/2022: 1414 ft; 10/31/2022: 1628 ft (previously met) Goal status: REVISED/MET 09/28/2022  6. Pt will improve modified oswestry score by 5 points or greater in order to indicate improved function in relation to his back pain   Baseline: 10/50; 11/01/22: 60%   Goal status: MET    ASSESSMENT:  CLINICAL IMPRESSION:  Pt exhibits improved balance with dynamic activities/large-amplitude movement training. He was able to progress to completing one of these tasks on mat for  compliant surface without observed decrease in stability.  The pt will continue to benefit from skilled therapy to address remaining deficits in order to improve overall QoL and return to PLOF.      OBJECTIVE IMPAIRMENTS: Abnormal gait, decreased balance, decreased endurance, decreased mobility, difficulty walking, and hypomobility.   ACTIVITY LIMITATIONS: carrying, lifting, bending, standing, squatting, stairs, and locomotion level  PARTICIPATION LIMITATIONS: community activity  PERSONAL FACTORS: Age and 1-2 comorbidities: Asthma  are also affecting patient's functional outcome.   REHAB POTENTIAL: Good  CLINICAL DECISION MAKING: Stable/uncomplicated  EVALUATION COMPLEXITY: Low  PLAN:  PT FREQUENCY: 2x/week  PT DURATION: 12 weeks  PLANNED INTERVENTIONS: Therapeutic exercises, Therapeutic activity, Neuromuscular re-education, Balance training, Gait training, Patient/Family education, Self Care, Joint mobilization, Joint manipulation, Stair training, Dry Needling, Spinal mobilization, Cryotherapy, Moist heat, and Manual therapy  PLAN FOR NEXT SESSION:  balance, low back pain, and PD specific impairments, emphasis on large arm swing    Physical Therapist - [facility]  [facility]  11/23/22, 5:27 PM

## 2022-11-23 NOTE — Therapy (Addendum)
OUTPATIENT PHYSICAL THERAPY NEURO TREATMENT NOTE    Patient Name: Daniel [name] MRN: [id number] DOB:1951/01/16, 72 y.o., male Today's Date: 11/27/2022   PCP:    Denzil Magnuson   REFERRING PROVIDER: Lonell Face, MD   END OF SESSION:  PT End of Session - 11/27/22 1548     Visit Number 17    Number of Visits 24    Date for PT Re-Evaluation 12/19/22    Authorization Type [employer] MEDICARE reporting period from 06/19/2022    Progress Note Due on Visit 10    PT Start Time 1600    PT Stop Time 1642    PT Time Calculation (min) 42 min    Equipment Utilized During Treatment Gait belt    Activity Tolerance Patient tolerated treatment well    Behavior During Therapy WFL for tasks assessed/performed              Past Medical History:  Diagnosis Date   Actinic keratosis    Asthma    Eczema    Hx of dysplastic nevus 06/06/2006   Mid lower back, slight atypia   Hypothyroidism    Seasonal allergies    Thyroid disease    Past Surgical History:  Procedure Laterality Date   CATARACT EXTRACTION     COLON SURGERY     COLONOSCOPY WITH PROPOFOL N/A 11/18/2014   Procedure: COLONOSCOPY WITH PROPOFOL;  Surgeon: Scot Jun, MD;  Location: [facility] ENDOSCOPY;  Service: Endoscopy;  Laterality: N/A;   COLONOSCOPY WITH PROPOFOL N/A 12/13/2021   Procedure: COLONOSCOPY WITH PROPOFOL;  Surgeon: Regis Bill, MD;  Location: [facility] ENDOSCOPY;  Service: Endoscopy;  Laterality: N/A;   EYE SURGERY     KNEE SURGERY     NASAL SEPTUM SURGERY     Patient Active Problem List   Diagnosis Date Noted   Parkinson's disease 09/26/2022   Chronic idiopathic constipation 06/05/2022   Left lower quadrant abdominal pain 06/05/2022   Paroxysmal atrial fibrillation (HCC) 03/28/2022   Idiopathic hypotension 03/28/2022   Spondylolysis, lumbar region 03/28/2022   Avitaminosis D 03/28/2022   Myalgia due to statin 03/28/2022   Acquired thrombophilia (HCC) 03/28/2022   Hyperlipidemia 03/28/2022    Abnormal gait 03/28/2022   Mild intermittent asthma without complication 03/28/2022   Zenker's (hypopharyngeal) diverticulum 03/22/2018   Gastroesophageal reflux disease 03/22/2018   Fatty liver 10/09/2017   BPH (benign prostatic hyperplasia) 11/21/2014   Hypothyroidism 11/21/2014   Inguinal hernia 11/21/2014   OA (osteoarthritis) 11/21/2014   H/O adenomatous polyp of colon 10/14/2014    ONSET DATE: 04/28/2022 for PD, back pain began around 09/10/21   REFERRING DIAG:  G20.A1 (ICD-10-CM) - Parkinson's disease  M54.9 (ICD-10-CM) - Back pain    THERAPY DIAG:  Other lack of coordination  Unsteadiness on feet  Muscle weakness (generalized)  Difficulty in walking, not elsewhere classified  Rationale for Evaluation and Treatment: Rehabilitation  SUBJECTIVE:  SUBJECTIVE STATEMENT:  Pt stated his knee is bothering him slightly but otherwise is well.     Pt accompanied by: self  PERTINENT HISTORY: Per [name] PT from 09/07/22 PT discharge from Low back treatment for focussed on PD related impairments at our clinic "Patient has attended 40 physical therapy treatment sessions since starting current episode of care on 12/20/2021. Patient at first struggled to make progress towards goals and has struggled at times with consistent attendance. However, over the past few weeks he has been improving with multi-discipline interventions including medications and increasing overall activity. It was also recently confirmed he has Parkinson's that is likely contributing to some of his abnormal posture and difficulty with full ROM during movements. Patient is being discharged today due to improvement in condition and so he can start PT with focus on rehabilitation techniques for Parkinson's. Patient was provided  with a long term HEP to help him with long term management of low back pain."  PAIN:  Are you having pain? Yes: NPRS scale: 5/10 Pain location: R knee  Pain description: sharp  Aggravating factors: when flexing the knee in weight bearing  Relieving factors: PT exercises, movement   PRECAUTIONS: None  WEIGHT BEARING RESTRICTIONS: No  FALLS: Has patient fallen in last 6 months? No  LIVING ENVIRONMENT: Lives with: lives alone Lives in: House/apartment Stairs: Yes: External: 5 steps; on left going up Has following equipment at home: None  PLOF: Independent  PATIENT GOALS: Improve his low back pain, improve posture, improve walking.   OBJECTIVE:   DIAGNOSTIC FINDINGS: MRI of L spine 07/12/21: Degenerative disc disease throughout the lumbar region with loss of disc height. No apparent compressive stenosis of the canal. Discogenic endplate marrow changes without active edema at L2-3, L3-4 and L4-5, which could correlate with regional axial back pain.   Left lateral recess and foraminal narrowing at L2-3, left foraminal narrowing at L3-4, and bilateral lateral recess and foraminal narrowing at L4-5. None of the stenoses are severe or are seen to result in definite neural compression.   Shallow left posterolateral disc herniation at L1-2 with caudal migration of a tiny amount of disc material to the left of midline. This does not appear to cause any significant compressive effect upon the neural structures.  COGNITION: Overall cognitive status: Within functional limits for tasks assessed   SENSATION: WFL   POSTURE: rounded shoulders, forward head, and increased thoracic kyphosis  LOWER EXTREMITY ROM:   WNL LOWER EXTREMITY MMT:    MMT Right Eval Left Eval  Hip flexion 4+ 4+  Hip extension    Hip abduction 5 5  Hip adduction 5 5  Hip internal rotation    Hip external rotation    Knee flexion 5 5  Knee extension 5 5  Ankle dorsiflexion 5 5  Ankle plantarflexion 5  5  Ankle inversion    Ankle eversion    (Blank rows = not tested)  BED MOBILITY: WNL    STAIRS: Level of Assistance: Complete Independence Stair Negotiation Technique: Alternating Pattern  with Bilateral Rails Number of Stairs: 4  Height of Stairs: 6    GAIT: Gait pattern:  forward posture and short step length bilateral Distance walked: 30 ft Assistive device utilized: None Level of assistance: Complete Independence   FUNCTIONAL TESTS:  5 times sit to stand: 13.07 6 minute walk test: 1414 ft  Mini Best Test:   PATIENT SURVEYS:  Modified Oswestry 10/50  FOTO 70, risk adjusted goal of 75  TODAY'S TREATMENT: DATE: 11/27/22  Unless otherwise stated, CGA was provided and gait belt donned in order to ensure pt safety   TherEx:(10 min) -cross body punches with squat to chair, 1.5# AW added to wrists during second trial  -windmills w/ 1.5# AW, emphasis on core activation  -therastick for STM to R ITB and quad to decrease tension at knee; added to HEP  NMRE: (28 min) -2 x laps 300' with 4# AW for strengthening of BLE and cardiovascular improvement, 1.5# AW on wrists, with dual task of naming family members from youngest to oldest; pt reported difficult -3 x 60: volleyball w/ balloon, emphasis on wide BOS and large movements, targeting dynamic balance. 1.5# AW donned to each wrist  -3 min x standing on [employer]ire[employer] with dual task re: putting different colors / vowels of letters into different shapes    PATIENT EDUCATION: Education details: Pt educated throughout session about proper posture and technique with exercises. Improved exercise technique, movement at target joints, use of target muscles after min to mod verbal, visual, tactile cues.   Person educated: Patient Education method: Explanation Education comprehension: verbalized understanding  HOME EXERCISE PROGRAM:Pt to continue HEP as listed below Seated PWR! Moves handout gone over and provided.  Standing PWR!  Moves handout reviewed and provided.  All fours modified to chair UE support with PWR! moves handout reviewed and provided    GOALS: Goals reviewed with patient? Yes  SHORT TERM GOALS: Target date: 10/24/2022    Patient will be independent in progressive home exercise program for PD to improve strength/mobility for better functional independence with ADLs. Baseline: No HEP for PD; 09/28/2022 per previous note pt currently perform PWR interventions as part of HEP; 10/31/22: Pt reports he feels comfortable with his HEP Goal status: MET   LONG TERM GOALS: Target date: 12/19/2022    1.  Patient  will complete five times sit to stand test in < 10 seconds indicating an increased LE strength and improved balance. Baseline: 13.07; 10/31/22 10.4 seconds hands-free  Goal status: PARTIALLY MET   2.  Patient will increase FOTO score to equal to or greater than  75   to demonstrate statistically significant improvement in mobility and quality of life.  Baseline: 70; 10/31/2022: 71 Goal status: ONGOING   3.  Patient will increase Mini best Balance score by > 4 points to demonstrate decreased fall risk during functional activities. Baseline: 19; 10/31/22: 23/28 Goal status: PARTIALLY MET   4.   Patient will reduce dual task timed up and go to <11 seconds to reduce fall risk and demonstrate improved transfer/gait ability. Baseline: 12.97l 10/31/22: 8 sec Goal status: MET  5.   Patient will increase six minute walk test distance to >1000 for progression to community ambulator and improve gait ability Baseline: Test visit 2 09/28/2022: 1414 ft; 10/31/2022: 1628 ft (previously met) Goal status: REVISED/MET 09/28/2022  6. Pt will improve modified oswestry score by 5 points or greater in order to indicate improved function in relation to his back pain   Baseline: 10/50; 11/01/22: 60%   Goal status: MET    ASSESSMENT:  CLINICAL IMPRESSION: Pt presented to skilled PT eager to participate in therapy. Pt's R  knee was hurting him during weight bearing knee flexion activities, and STM with therastick was introduced for tissue extensibility and pain management. Pt's carry over of arm swing was better today, and required minimal cues. Dual cognitive tasks during therex continue to be a challenge; Pt will continue to benefit from  skilled therapy to address remaining deficits in order to improve overall QoL and return to PLOF.      OBJECTIVE IMPAIRMENTS: Abnormal gait, decreased balance, decreased endurance, decreased mobility, difficulty walking, and hypomobility.   ACTIVITY LIMITATIONS: carrying, lifting, bending, standing, squatting, stairs, and locomotion level  PARTICIPATION LIMITATIONS: community activity  PERSONAL FACTORS: Age and 1-2 comorbidities: Asthma  are also affecting patient's functional outcome.   REHAB POTENTIAL: Good  CLINICAL DECISION MAKING: Stable/uncomplicated  EVALUATION COMPLEXITY: Low  PLAN:  PT FREQUENCY: 2x/week  PT DURATION: 12 weeks  PLANNED INTERVENTIONS: Therapeutic exercises, Therapeutic activity, Neuromuscular re-education, Balance training, Gait training, Patient/Family education, Self Care, Joint mobilization, Joint manipulation, Stair training, Dry Needling, Spinal mobilization, Cryotherapy, Moist heat, and Manual therapy  PLAN FOR NEXT SESSION:  balance, low back pain, and PD specific impairments, emphasis on large arm swing    Judith Blonder, SPT   This entire session was performed under direct supervision and direction of a licensed Estate agent . I have personally read, edited and approve of the note as written.  Precious Bard, PT, DPT Physical Therapist - [facility]  Out[facility] Physical Therapy- [facility] [phone]     11/27/22, 4:52 PM

## 2022-11-27 ENCOUNTER — Ambulatory Visit: Payer: Medicare Other | Attending: Neurology

## 2022-11-27 DIAGNOSIS — M6281 Muscle weakness (generalized): Secondary | ICD-10-CM | POA: Diagnosis not present

## 2022-11-27 DIAGNOSIS — R2681 Unsteadiness on feet: Secondary | ICD-10-CM | POA: Diagnosis not present

## 2022-11-27 DIAGNOSIS — G8929 Other chronic pain: Secondary | ICD-10-CM | POA: Insufficient documentation

## 2022-11-27 DIAGNOSIS — R262 Difficulty in walking, not elsewhere classified: Secondary | ICD-10-CM | POA: Diagnosis not present

## 2022-11-27 DIAGNOSIS — M25551 Pain in right hip: Secondary | ICD-10-CM | POA: Diagnosis not present

## 2022-11-27 DIAGNOSIS — M546 Pain in thoracic spine: Secondary | ICD-10-CM | POA: Diagnosis not present

## 2022-11-27 DIAGNOSIS — M5459 Other low back pain: Secondary | ICD-10-CM | POA: Diagnosis not present

## 2022-11-27 DIAGNOSIS — R293 Abnormal posture: Secondary | ICD-10-CM | POA: Insufficient documentation

## 2022-11-27 DIAGNOSIS — R278 Other lack of coordination: Secondary | ICD-10-CM

## 2022-11-27 DIAGNOSIS — M545 Low back pain, unspecified: Secondary | ICD-10-CM | POA: Diagnosis not present

## 2022-11-28 DIAGNOSIS — M48062 Spinal stenosis, lumbar region with neurogenic claudication: Secondary | ICD-10-CM | POA: Diagnosis not present

## 2022-11-28 DIAGNOSIS — M47816 Spondylosis without myelopathy or radiculopathy, lumbar region: Secondary | ICD-10-CM | POA: Diagnosis not present

## 2022-11-28 DIAGNOSIS — M5136 Other intervertebral disc degeneration, lumbar region: Secondary | ICD-10-CM | POA: Diagnosis not present

## 2022-11-28 DIAGNOSIS — M5416 Radiculopathy, lumbar region: Secondary | ICD-10-CM | POA: Diagnosis not present

## 2022-11-29 ENCOUNTER — Ambulatory Visit: Payer: Medicare Other

## 2022-11-29 NOTE — Therapy (Signed)
OUTPATIENT PHYSICAL THERAPY NEURO TREATMENT NOTE    Patient Name: Daniel [name] MRN: [id number] DOB:May 16, 1951, 72 y.o., male Today's Date: 12/04/2022   PCP:    Denzil Magnuson   REFERRING PROVIDER: Lonell Face, MD   END OF SESSION:  PT End of Session - 12/04/22 1615     Visit Number 18    Number of Visits 24    Date for PT Re-Evaluation 12/19/22    Authorization Type [employer] MEDICARE reporting period from 06/19/2022    PT Start Time 1615    PT Stop Time 1657    PT Time Calculation (min) 42 min    Equipment Utilized During Treatment Gait belt    Activity Tolerance Patient tolerated treatment well    Behavior During Therapy [facility] for tasks assessed/performed              Past Medical History:  Diagnosis Date   Actinic keratosis    Asthma    Eczema    Hx of dysplastic nevus 06/06/2006   Mid lower back, slight atypia   Hypothyroidism    Seasonal allergies    Thyroid disease    Past Surgical History:  Procedure Laterality Date   CATARACT EXTRACTION     COLON SURGERY     COLONOSCOPY WITH PROPOFOL N/A 11/18/2014   Procedure: COLONOSCOPY WITH PROPOFOL;  Surgeon: Scot Jun, MD;  Location: [facility] ENDOSCOPY;  Service: Endoscopy;  Laterality: N/A;   COLONOSCOPY WITH PROPOFOL N/A 12/13/2021   Procedure: COLONOSCOPY WITH PROPOFOL;  Surgeon: Regis Bill, MD;  Location: [facility] ENDOSCOPY;  Service: Endoscopy;  Laterality: N/A;   EYE SURGERY     KNEE SURGERY     NASAL SEPTUM SURGERY     Patient Active Problem List   Diagnosis Date Noted   Parkinson's disease 09/26/2022   Chronic idiopathic constipation 06/05/2022   Left lower quadrant abdominal pain 06/05/2022   Paroxysmal atrial fibrillation (HCC) 03/28/2022   Idiopathic hypotension 03/28/2022   Spondylolysis, lumbar region 03/28/2022   Avitaminosis D 03/28/2022   Myalgia due to statin 03/28/2022   Acquired thrombophilia (HCC) 03/28/2022   Hyperlipidemia 03/28/2022   Abnormal gait 03/28/2022    Mild intermittent asthma without complication 03/28/2022   Zenker's (hypopharyngeal) diverticulum 03/22/2018   Gastroesophageal reflux disease 03/22/2018   Fatty liver 10/09/2017   BPH (benign prostatic hyperplasia) 11/21/2014   Hypothyroidism 11/21/2014   Inguinal hernia 11/21/2014   OA (osteoarthritis) 11/21/2014   H/O adenomatous polyp of colon 10/14/2014    ONSET DATE: 04/28/2022 for PD, back pain began around 09/10/21   REFERRING DIAG:  G20.A1 (ICD-10-CM) - Parkinson's disease  M54.9 (ICD-10-CM) - Back pain    THERAPY DIAG:  Other lack of coordination  Unsteadiness on feet  Muscle weakness (generalized)  Difficulty in walking, not elsewhere classified  Rationale for Evaluation and Treatment: Rehabilitation  SUBJECTIVE:  SUBJECTIVE STATEMENT:  Pt reported his back locked up and painful last week; pt explained he walked around 10 blocks on the 3rd in the heat while running errands, and on the 4th he did a lot of standing to prep food  in the morning and had his back brace on, however, in the evening pt feels his back was stiff and then by the 5th his back was so locked up he had to hold onto furniture to navigate household.   Pt accompanied by: self  PERTINENT HISTORY: Per [name] PT from 09/07/22 PT discharge from Low back treatment for focussed on PD related impairments at our clinic "Patient has attended 40 physical therapy treatment sessions since starting current episode of care on 12/20/2021. Patient at first struggled to make progress towards goals and has struggled at times with consistent attendance. However, over the past few weeks he has been improving with multi-discipline interventions including medications and increasing overall activity. It was also recently confirmed he has  Parkinson's that is likely contributing to some of his abnormal posture and difficulty with full ROM during movements. Patient is being discharged today due to improvement in condition and so he can start PT with focus on rehabilitation techniques for Parkinson's. Patient was provided with a long term HEP to help him with long term management of low back pain."  PAIN:  Are you having pain? Yes: NPRS scale: 5/10 Pain location: R knee  Pain description: sharp  Aggravating factors: when flexing the knee in weight bearing  Relieving factors: PT exercises, movement   PRECAUTIONS: None  WEIGHT BEARING RESTRICTIONS: No  FALLS: Has patient fallen in last 6 months? No  LIVING ENVIRONMENT: Lives with: lives alone Lives in: House/apartment Stairs: Yes: External: 5 steps; on left going up Has following equipment at home: None  PLOF: Independent  PATIENT GOALS: Improve his low back pain, improve posture, improve walking.   OBJECTIVE:   DIAGNOSTIC FINDINGS: MRI of L spine 07/12/21: Degenerative disc disease throughout the lumbar region with loss of disc height. No apparent compressive stenosis of the canal. Discogenic endplate marrow changes without active edema at L2-3, L3-4 and L4-5, which could correlate with regional axial back pain.   Left lateral recess and foraminal narrowing at L2-3, left foraminal narrowing at L3-4, and bilateral lateral recess and foraminal narrowing at L4-5. None of the stenoses are severe or are seen to result in definite neural compression.   Shallow left posterolateral disc herniation at L1-2 with caudal migration of a tiny amount of disc material to the left of midline. This does not appear to cause any significant compressive effect upon the neural structures.  COGNITION: Overall cognitive status: Within functional limits for tasks assessed   SENSATION: WFL   POSTURE: rounded shoulders, forward head, and increased thoracic kyphosis  LOWER EXTREMITY  ROM:   WNL LOWER EXTREMITY MMT:    MMT Right Eval Left Eval  Hip flexion 4+ 4+  Hip extension    Hip abduction 5 5  Hip adduction 5 5  Hip internal rotation    Hip external rotation    Knee flexion 5 5  Knee extension 5 5  Ankle dorsiflexion 5 5  Ankle plantarflexion 5 5  Ankle inversion    Ankle eversion    (Blank rows = not tested)  BED MOBILITY: WNL    STAIRS: Level of Assistance: Complete Independence Stair Negotiation Technique: Alternating Pattern  with Bilateral Rails Number of Stairs: 4  Height of Stairs: 6  GAIT: Gait pattern:  forward posture and short step length bilateral Distance walked: 30 ft Assistive device utilized: None Level of assistance: Complete Independence   FUNCTIONAL TESTS:  5 times sit to stand: 13.07 6 minute walk test: 1414 ft  Mini Best Test:   PATIENT SURVEYS:  Modified Oswestry 10/50  FOTO 70, risk adjusted goal of 75     TODAY'S TREATMENT: DATE: 12/04/22  Unless otherwise stated, CGA was provided and gait belt donned in order to ensure pt safety   NMRE: (40 min) -4 x 75': fwd walking with cues from SPT hand at pelvis level for pt to feel during arm swing; pt required tactile cues on L > R side -10 x 75': ambulation re: fwd/bwd/side walking with ball toss activity and dual task of listing different geographical locations  -of note: during side stepping to the L, pt's B feet turned to the L and required consistent cues to face them fwd -3 min standing on [employer] with dual task writing wine pairing; cued no UE support  -2 x 60 sec: seated dynadisc finding core balance with CL hand claps and 90 deg of shoulder abduction   Trigger Point Dry Needling (TDN), unbilled Education performed with patient regarding potential benefit of TDN. Reviewed precautions and risks with patient. Reviewed special precautions/risks over lung fields which include pneumothorax. Reviewed signs and symptoms of pneumothorax and advised pt to go to ER  immediately if these symptoms develop advise them of dry needling treatment. Extensive time spent with pt to ensure full understanding of TDN risks. Pt provided verbal consent to treatment. TDN performed to  with 0.25 x 40 single needle placements with local twitch response (LTR). Pistoning technique utilized. Improved pain-free motion following intervention. R lumbar paraspinal and piriformis x 2 minutes    PATIENT EDUCATION: Education details: Pt educated throughout session about proper posture and technique with exercises. Improved exercise technique, movement at target joints, use of target muscles after min to mod verbal, visual, tactile cues.   Person educated: Patient Education method: Explanation Education comprehension: verbalized understanding  HOME EXERCISE PROGRAM:Pt to continue HEP as listed below Seated PWR! Moves handout gone over and provided.  Standing PWR! Moves handout reviewed and provided.  All fours modified to chair UE support with PWR! moves handout reviewed and provided    GOALS: Goals reviewed with patient? Yes  SHORT TERM GOALS: Target date: 10/24/2022    Patient will be independent in progressive home exercise program for PD to improve strength/mobility for better functional independence with ADLs. Baseline: No HEP for PD; 09/28/2022 per previous note pt currently perform PWR interventions as part of HEP; 10/31/22: Pt reports he feels comfortable with his HEP Goal status: MET   LONG TERM GOALS: Target date: 12/19/2022    1.  Patient  will complete five times sit to stand test in < 10 seconds indicating an increased LE strength and improved balance. Baseline: 13.07; 10/31/22 10.4 seconds hands-free  Goal status: PARTIALLY MET   2.  Patient will increase FOTO score to equal to or greater than  75   to demonstrate statistically significant improvement in mobility and quality of life.  Baseline: 70; 10/31/2022: 71 Goal status: ONGOING   3.  Patient will increase  Mini best Balance score by > 4 points to demonstrate decreased fall risk during functional activities. Baseline: 19; 10/31/22: 23/28 Goal status: PARTIALLY MET   4.   Patient will reduce dual task timed up and go to <11 seconds to reduce fall  risk and demonstrate improved transfer/gait ability. Baseline: 12.97l 10/31/22: 8 sec Goal status: MET  5.   Patient will increase six minute walk test distance to >1000 for progression to community ambulator and improve gait ability Baseline: Test visit 2 09/28/2022: 1414 ft; 10/31/2022: 1628 ft (previously met) Goal status: REVISED/MET 09/28/2022  6. Pt will improve modified oswestry score by 5 points or greater in order to indicate improved function in relation to his back pain   Baseline: 10/50; 11/01/22: 60%   Goal status: MET    ASSESSMENT:  CLINICAL IMPRESSION: Pt presented to skilled PT eager to participate in therapy. Pt reported a severe low back flare (see subjective) resulting in interference with mobility on July 5th. Pt was introduced to dry needling for the first time to address trigger points along LB / piriformis; will follow-up next session to see how he handled it afterwards. Dual tasking during mobility continues to be a challenge resulting in narrowed BOS and decreased ROM. Pt will continue to benefit from gait training to address arm swing as well. Pt will continue to benefit from skilled therapy to address remaining deficits in order to improve overall QoL and return to PLOF.      OBJECTIVE IMPAIRMENTS: Abnormal gait, decreased balance, decreased endurance, decreased mobility, difficulty walking, and hypomobility.   ACTIVITY LIMITATIONS: carrying, lifting, bending, standing, squatting, stairs, and locomotion level  PARTICIPATION LIMITATIONS: community activity  PERSONAL FACTORS: Age and 1-2 comorbidities: Asthma  are also affecting patient's functional outcome.   REHAB POTENTIAL: Good  CLINICAL DECISION MAKING:  Stable/uncomplicated  EVALUATION COMPLEXITY: Low  PLAN:  PT FREQUENCY: 2x/week  PT DURATION: 12 weeks  PLANNED INTERVENTIONS: Therapeutic exercises, Therapeutic activity, Neuromuscular re-education, Balance training, Gait training, Patient/Family education, Self Care, Joint mobilization, Joint manipulation, Stair training, Dry Needling, Spinal mobilization, Cryotherapy, Moist heat, and Manual therapy  PLAN FOR NEXT SESSION:  balance, low back pain, and PD specific impairments, emphasis on large arm swing, check in with pt about DN    Judith Blonder, SPT   This entire session was performed under direct supervision and direction of a licensed Estate agent . I have personally read, edited and approve of the note as written.  Precious Bard, PT, DPT Physical Therapist - [facility]  Outpatient Physical Therapy- Main Campus [phone]     12/04/22, 5:20 PM

## 2022-12-04 ENCOUNTER — Ambulatory Visit: Payer: Medicare Other

## 2022-12-04 DIAGNOSIS — M6281 Muscle weakness (generalized): Secondary | ICD-10-CM

## 2022-12-04 DIAGNOSIS — R278 Other lack of coordination: Secondary | ICD-10-CM | POA: Diagnosis not present

## 2022-12-04 DIAGNOSIS — M546 Pain in thoracic spine: Secondary | ICD-10-CM | POA: Diagnosis not present

## 2022-12-04 DIAGNOSIS — R262 Difficulty in walking, not elsewhere classified: Secondary | ICD-10-CM | POA: Diagnosis not present

## 2022-12-04 DIAGNOSIS — M5459 Other low back pain: Secondary | ICD-10-CM | POA: Diagnosis not present

## 2022-12-04 DIAGNOSIS — R2681 Unsteadiness on feet: Secondary | ICD-10-CM

## 2022-12-04 DIAGNOSIS — M545 Low back pain, unspecified: Secondary | ICD-10-CM | POA: Diagnosis not present

## 2022-12-04 DIAGNOSIS — M25551 Pain in right hip: Secondary | ICD-10-CM | POA: Diagnosis not present

## 2022-12-04 DIAGNOSIS — R293 Abnormal posture: Secondary | ICD-10-CM | POA: Diagnosis not present

## 2022-12-04 DIAGNOSIS — G8929 Other chronic pain: Secondary | ICD-10-CM | POA: Diagnosis not present

## 2022-12-07 ENCOUNTER — Ambulatory Visit: Payer: Medicare Other

## 2022-12-07 NOTE — Therapy (Signed)
OUTPATIENT PHYSICAL THERAPY NEURO TREATMENT NOTE Physical Therapy Progress Note   Dates of reporting period  10/31/2022   to   12/11/2022     Patient Name: Daniel [name] MRN: [id number] DOB:16-Aug-1950, 72 y.o., male Today's Date: 12/11/2022   PCP:    Denzil Magnuson   REFERRING PROVIDER: Lonell Face, MD   END OF SESSION:  PT End of Session - 12/11/22 1619     Visit Number 20    Number of Visits 24    Date for PT Re-Evaluation 12/19/22    Authorization Type [employer] MEDICARE reporting period from 06/19/2022    Progress Note Due on Visit 10    PT Start Time 1616    PT Stop Time 1658    PT Time Calculation (min) 42 min    Equipment Utilized During Treatment Gait belt    Activity Tolerance Patient tolerated treatment well    Behavior During Therapy WFL for tasks assessed/performed               Past Medical History:  Diagnosis Date   Actinic keratosis    Asthma    Eczema    Hx of dysplastic nevus 06/06/2006   Mid lower back, slight atypia   Hypothyroidism    Seasonal allergies    Thyroid disease    Past Surgical History:  Procedure Laterality Date   CATARACT EXTRACTION     COLON SURGERY     COLONOSCOPY WITH PROPOFOL N/A 11/18/2014   Procedure: COLONOSCOPY WITH PROPOFOL;  Surgeon: Scot Jun, MD;  Location: [facility] ENDOSCOPY;  Service: Endoscopy;  Laterality: N/A;   COLONOSCOPY WITH PROPOFOL N/A 12/13/2021   Procedure: COLONOSCOPY WITH PROPOFOL;  Surgeon: Regis Bill, MD;  Location: [facility] ENDOSCOPY;  Service: Endoscopy;  Laterality: N/A;   EYE SURGERY     KNEE SURGERY     NASAL SEPTUM SURGERY     Patient Active Problem List   Diagnosis Date Noted   Parkinson's disease 09/26/2022   Chronic idiopathic constipation 06/05/2022   Left lower quadrant abdominal pain 06/05/2022   Paroxysmal atrial fibrillation (HCC) 03/28/2022   Idiopathic hypotension 03/28/2022   Spondylolysis, lumbar region 03/28/2022   Avitaminosis D 03/28/2022   Myalgia  due to statin 03/28/2022   Acquired thrombophilia (HCC) 03/28/2022   Hyperlipidemia 03/28/2022   Abnormal gait 03/28/2022   Mild intermittent asthma without complication 03/28/2022   Zenker's (hypopharyngeal) diverticulum 03/22/2018   Gastroesophageal reflux disease 03/22/2018   Fatty liver 10/09/2017   BPH (benign prostatic hyperplasia) 11/21/2014   Hypothyroidism 11/21/2014   Inguinal hernia 11/21/2014   OA (osteoarthritis) 11/21/2014   H/O adenomatous polyp of colon 10/14/2014    ONSET DATE: 04/28/2022 for PD, back pain began around 09/10/21   REFERRING DIAG:  G20.A1 (ICD-10-CM) - Parkinson's disease  M54.9 (ICD-10-CM) - Back pain    THERAPY DIAG:  Other lack of coordination  Muscle weakness (generalized)  Difficulty in walking, not elsewhere classified  Unsteadiness on feet  Rationale for Evaluation and Treatment: Rehabilitation  SUBJECTIVE:  SUBJECTIVE STATEMENT:  Pt reports his back is better, but not 100%. Pt states he went shopping yesterday for 2 hours with minimal pain, as well as waking up with little pain compared to previous weeks.  Pt accompanied by: self  PERTINENT HISTORY: Per [name] PT from 09/07/22 PT discharge from Low back treatment for focussed on PD related impairments at our clinic "Patient has attended 40 physical therapy treatment sessions since starting current episode of care on 12/20/2021. Patient at first struggled to make progress towards goals and has struggled at times with consistent attendance. However, over the past few weeks he has been improving with multi-discipline interventions including medications and increasing overall activity. It was also recently confirmed he has Parkinson's that is likely contributing to some of his abnormal posture and  difficulty with full ROM during movements. Patient is being discharged today due to improvement in condition and so he can start PT with focus on rehabilitation techniques for Parkinson's. Patient was provided with a long term HEP to help him with long term management of low back pain."  PAIN:  Are you having pain? Yes: NPRS scale: 5/10 Pain location: R knee  Pain description: sharp  Aggravating factors: when flexing the knee in weight bearing  Relieving factors: PT exercises, movement   PRECAUTIONS: None  WEIGHT BEARING RESTRICTIONS: No  FALLS: Has patient fallen in last 6 months? No  LIVING ENVIRONMENT: Lives with: lives alone Lives in: House/apartment Stairs: Yes: External: 5 steps; on left going up Has following equipment at home: None  PLOF: Independent  PATIENT GOALS: Improve his low back pain, improve posture, improve walking.   OBJECTIVE:   DIAGNOSTIC FINDINGS: MRI of L spine 07/12/21: Degenerative disc disease throughout the lumbar region with loss of disc height. No apparent compressive stenosis of the canal. Discogenic endplate marrow changes without active edema at L2-3, L3-4 and L4-5, which could correlate with regional axial back pain.   Left lateral recess and foraminal narrowing at L2-3, left foraminal narrowing at L3-4, and bilateral lateral recess and foraminal narrowing at L4-5. None of the stenoses are severe or are seen to result in definite neural compression.   Shallow left posterolateral disc herniation at L1-2 with caudal migration of a tiny amount of disc material to the left of midline. This does not appear to cause any significant compressive effect upon the neural structures.  COGNITION: Overall cognitive status: Within functional limits for tasks assessed   SENSATION: WFL   POSTURE: rounded shoulders, forward head, and increased thoracic kyphosis  LOWER EXTREMITY ROM:   WNL LOWER EXTREMITY MMT:    MMT Right Eval Left Eval  Hip  flexion 4+ 4+  Hip extension    Hip abduction 5 5  Hip adduction 5 5  Hip internal rotation    Hip external rotation    Knee flexion 5 5  Knee extension 5 5  Ankle dorsiflexion 5 5  Ankle plantarflexion 5 5  Ankle inversion    Ankle eversion    (Blank rows = not tested)  BED MOBILITY: WNL    STAIRS: Level of Assistance: Complete Independence Stair Negotiation Technique: Alternating Pattern  with Bilateral Rails Number of Stairs: 4  Height of Stairs: 6    GAIT: Gait pattern:  forward posture and short step length bilateral Distance walked: 30 ft Assistive device utilized: None Level of assistance: Complete Independence   FUNCTIONAL TESTS:  5 times sit to stand: 13.07 6 minute walk test: 1414 ft  Mini Best Test:  PATIENT SURVEYS:  Modified Oswestry 10/50  FOTO 70, risk adjusted goal of 75     TODAY'S TREATMENT: DATE: 12/11/22  Unless otherwise stated, CGA was provided and gait belt donned in order to ensure pt safety   Therex: (38 min)  -mini-Best for goals  -TUG for goals -pt education re: updated goals and HEP -FOTO for goal update  -3 x 10 B long sitting hip flexion  -4 trials: L SLS with R hip flex with goal of >80 deg hip flexion with UUE support, 4# AW added to RLE for hip flexion strength      PATIENT EDUCATION: Education details: Pt educated throughout session about proper posture and technique with exercises. Improved exercise technique, movement at target joints, use of target muscles after min to mod verbal, visual, tactile cues.   Person educated: Patient Education method: Explanation Education comprehension: verbalized understanding  HOME EXERCISE PROGRAM:Pt to continue HEP as listed below Seated PWR! Moves handout gone over and provided.  Standing PWR! Moves handout reviewed and provided.  All fours modified to chair UE support with PWR! moves handout reviewed and provided Access Code: [email or web address] URL:  [email or web address] Date: 12/11/2022 Prepared by: Precious Bard Exercises - Straight Leg Raise with Arm Support  - 1 x daily - 7 x weekly - 2 sets - 10 reps - 5 hold - Single Leg  - 1 x daily - 7 x weekly - 2 sets - 2 reps - 30 hold    GOALS: Goals reviewed with patient? Yes  SHORT TERM GOALS: Target date: 10/24/2022    Patient will be independent in progressive home exercise program for PD to improve strength/mobility for better functional independence with ADLs. Baseline: No HEP for PD; 09/28/2022 per previous note pt currently perform PWR interventions as part of HEP; 10/31/22: Pt reports he feels comfortable with his HEP Goal status: MET   LONG TERM GOALS: Target date: 12/19/2022    1.  Patient  will complete five times sit to stand test in < 10 seconds indicating an increased LE strength and improved balance. Baseline: 13.07; 10/31/22 10.4 seconds hands-free; 12/11/22: 9.45 sec  Goal status:  MET   2.  Patient will increase FOTO score to equal to or greater than  75   to demonstrate statistically significant improvement in mobility and quality of life.  Baseline: 70; 10/31/2022: 71, 12/11/22: 82 Goal status: MET   3.  Patient will increase Mini best Balance score by > 4 points to demonstrate decreased fall risk during functional activities. Baseline: 19; 10/31/22: 23/28; 12/11/22: 25/28 Goal status: MET   4.   Patient will reduce dual task timed up and go to <11 seconds to reduce fall risk and demonstrate improved transfer/gait ability. Baseline: 12.97l 10/31/22: 8 sec Goal status: MET  5.   Patient will increase six minute walk test distance to >1000 for progression to community ambulator and improve gait ability Baseline: Test visit 2 09/28/2022: 1414 ft; 10/31/2022: 1628 ft (previously met) Goal status: REVISED/MET 09/28/2022  6. Pt will improve modified oswestry score by 5 points or greater in order to indicate improved function in relation to his back pain   Baseline:  10/50; 11/01/22: 60%   Goal status: MET 7. Patient will be able to put his pants on without use of furniture to lean on.   Baseline: unable to hold R leg up > 80 deg hip flexion for longer than 5 seconds without UE support   Goal status: Initial  ASSESSMENT:  CLINICAL IMPRESSION:  Patient's condition has the potential to improve in response to therapy. Maximum improvement is yet to be obtained. The anticipated improvement is attainable and reasonable in a generally predictable time.  Patient reports noticeable improvement in standing tolerance, driving no longer bothers his back, and some improvement in balance with no falls or near falls in the past 8 weeks. Pt reports getting dressed and stepping into pants without holding onto or leaning furniture is still hard; pt reports R hip is harder to lift than his L. Patient has made great progress toward goals, which have been met today; goals updated to include new goal for dressing and balance. Dual tasking during mobility continues to be a challenge resulting in narrowed BOS and decreased ROM. Pt will continue to benefit from gait training to address arm swing as well. Pt will continue to benefit from skilled therapy to address remaining deficits in order to improve overall QoL and return to PLOF.      OBJECTIVE IMPAIRMENTS: Abnormal gait, decreased balance, decreased endurance, decreased mobility, difficulty walking, and hypomobility.   ACTIVITY LIMITATIONS: carrying, lifting, bending, standing, squatting, stairs, and locomotion level  PARTICIPATION LIMITATIONS: community activity  PERSONAL FACTORS: Age and 1-2 comorbidities: Asthma  are also affecting patient's functional outcome.   REHAB POTENTIAL: Good  CLINICAL DECISION MAKING: Stable/uncomplicated  EVALUATION COMPLEXITY: Low  PLAN:  PT FREQUENCY: 2x/week  PT DURATION: 12 weeks  PLANNED INTERVENTIONS: Therapeutic exercises, Therapeutic activity, Neuromuscular re-education,  Balance training, Gait training, Patient/Family education, Self Care, Joint mobilization, Joint manipulation, Stair training, Dry Needling, Spinal mobilization, Cryotherapy, Moist heat, and Manual therapy  PLAN FOR NEXT SESSION:  balance, low back pain, and PD specific impairments, emphasis on large arm swing, work on L SL stance with R hip flexion > 80 deg ROM with less UE dependence     [place] Terez Freimark, SPT   This entire session was performed under direct supervision and direction of a licensed Estate agent . I have personally read, edited and approve of the note as written.  Precious Bard, PT, DPT Physical Therapist - [facility]  Outpatient Physical Therapy- Main Campus [phone]     12/11/22, 5:21 PM

## 2022-12-08 ENCOUNTER — Ambulatory Visit: Payer: Medicare Other | Admitting: Physical Therapy

## 2022-12-08 DIAGNOSIS — M545 Low back pain, unspecified: Secondary | ICD-10-CM | POA: Diagnosis not present

## 2022-12-08 DIAGNOSIS — R278 Other lack of coordination: Secondary | ICD-10-CM | POA: Diagnosis not present

## 2022-12-08 DIAGNOSIS — R262 Difficulty in walking, not elsewhere classified: Secondary | ICD-10-CM

## 2022-12-08 DIAGNOSIS — M5459 Other low back pain: Secondary | ICD-10-CM | POA: Diagnosis not present

## 2022-12-08 DIAGNOSIS — R293 Abnormal posture: Secondary | ICD-10-CM

## 2022-12-08 DIAGNOSIS — M25551 Pain in right hip: Secondary | ICD-10-CM

## 2022-12-08 DIAGNOSIS — G8929 Other chronic pain: Secondary | ICD-10-CM | POA: Diagnosis not present

## 2022-12-08 DIAGNOSIS — R2681 Unsteadiness on feet: Secondary | ICD-10-CM | POA: Diagnosis not present

## 2022-12-08 DIAGNOSIS — M546 Pain in thoracic spine: Secondary | ICD-10-CM | POA: Diagnosis not present

## 2022-12-08 DIAGNOSIS — M6281 Muscle weakness (generalized): Secondary | ICD-10-CM | POA: Diagnosis not present

## 2022-12-08 NOTE — Therapy (Signed)
OUTPATIENT PHYSICAL THERAPY NEURO TREATMENT NOTE    Patient Name: Daniel [name] MRN: [id number] DOB:[birth year], 72 y.o., male Today's Date: 12/08/2022   PCP:    Denzil Magnuson   REFERRING PROVIDER: Lonell Face, MD   END OF SESSION:  PT End of Session - 12/08/22 0847     Visit Number 19    Number of Visits 24    Date for PT Re-Evaluation 12/19/22    Authorization Type [employer] MEDICARE reporting period from 06/19/2022    PT Start Time 0847    PT Stop Time 0930    PT Time Calculation (min) 43 min    Equipment Utilized During Treatment Gait belt    Activity Tolerance Patient tolerated treatment well    Behavior During Therapy [facility] for tasks assessed/performed              Past Medical History:  Diagnosis Date   Actinic keratosis    Asthma    Eczema    Hx of dysplastic nevus 06/06/2006   Mid lower back, slight atypia   Hypothyroidism    Seasonal allergies    Thyroid disease    Past Surgical History:  Procedure Laterality Date   CATARACT EXTRACTION     COLON SURGERY     COLONOSCOPY WITH PROPOFOL N/A 11/18/2014   Procedure: COLONOSCOPY WITH PROPOFOL;  Surgeon: Scot Jun, MD;  Location: [facility] ENDOSCOPY;  Service: Endoscopy;  Laterality: N/A;   COLONOSCOPY WITH PROPOFOL N/A 12/13/2021   Procedure: COLONOSCOPY WITH PROPOFOL;  Surgeon: Regis Bill, MD;  Location: [facility] ENDOSCOPY;  Service: Endoscopy;  Laterality: N/A;   EYE SURGERY     KNEE SURGERY     NASAL SEPTUM SURGERY     Patient Active Problem List   Diagnosis Date Noted   Parkinson's disease 09/26/2022   Chronic idiopathic constipation 06/05/2022   Left lower quadrant abdominal pain 06/05/2022   Paroxysmal atrial fibrillation (HCC) 03/28/2022   Idiopathic hypotension 03/28/2022   Spondylolysis, lumbar region 03/28/2022   Avitaminosis D 03/28/2022   Myalgia due to statin 03/28/2022   Acquired thrombophilia (HCC) 03/28/2022   Hyperlipidemia 03/28/2022   Abnormal gait 03/28/2022    Mild intermittent asthma without complication 03/28/2022   Zenker's (hypopharyngeal) diverticulum 03/22/2018   Gastroesophageal reflux disease 03/22/2018   Fatty liver 10/09/2017   BPH (benign prostatic hyperplasia) 11/21/2014   Hypothyroidism 11/21/2014   Inguinal hernia 11/21/2014   OA (osteoarthritis) 11/21/2014   H/O adenomatous polyp of colon 10/14/2014    ONSET DATE: 04/28/2022 for PD, back pain began around 09/10/21   REFERRING DIAG:  G20.A1 (ICD-10-CM) - Parkinson's disease  M54.9 (ICD-10-CM) - Back pain    THERAPY DIAG:  Other lack of coordination  Unsteadiness on feet  Muscle weakness (generalized)  Difficulty in walking, not elsewhere classified  Abnormal posture  Pain in right hip  Chronic bilateral low back pain, unspecified whether sciatica present  Other low back pain  Pain in thoracic spine  Rationale for Evaluation and Treatment: Rehabilitation  SUBJECTIVE:  SUBJECTIVE STATEMENT:  Pt reported his back has continued to spasm, and getting up and down have become painful. Pt reports that dry needling helped for about a day and half, and then spasms returned. Pt reports that he medicated about 7:30 this morning. Of note, pt reports that he spent a large portion of the day standing to kitchen counter bent over preparing food, prior to new increase in LOB on July 4th.    Pt accompanied by: self  PERTINENT HISTORY: Per [name] PT from 09/07/22 PT discharge from Low back treatment for focussed on PD related impairments at our clinic "Patient has attended 40 physical therapy treatment sessions since starting current episode of care on 12/20/2021. Patient at first struggled to make progress towards goals and has struggled at times with consistent attendance. However, over the  past few weeks he has been improving with multi-discipline interventions including medications and increasing overall activity. It was also recently confirmed he has Parkinson's that is likely contributing to some of his abnormal posture and difficulty with full ROM during movements. Patient is being discharged today due to improvement in condition and so he can start PT with focus on rehabilitation techniques for Parkinson's. Patient was provided with a long term HEP to help him with long term management of low back pain."  PAIN:  Are you having pain? Yes: NPRS scale: 5/10 Pain location: lower back.  Pain description: stabbing  Aggravating factors: standing and moving.  Relieving factors: PT exercises, movement   PRECAUTIONS: None  WEIGHT BEARING RESTRICTIONS: No  FALLS: Has patient fallen in last 6 months? No  LIVING ENVIRONMENT: Lives with: lives alone Lives in: House/apartment Stairs: Yes: External: 5 steps; on left going up Has following equipment at home: None  PLOF: Independent  PATIENT GOALS: Improve his low back pain, improve posture, improve walking.   OBJECTIVE:   DIAGNOSTIC FINDINGS: MRI of L spine 07/12/21: Degenerative disc disease throughout the lumbar region with loss of disc height. No apparent compressive stenosis of the canal. Discogenic endplate marrow changes without active edema at L2-3, L3-4 and L4-5, which could correlate with regional axial back pain.   Left lateral recess and foraminal narrowing at L2-3, left foraminal narrowing at L3-4, and bilateral lateral recess and foraminal narrowing at L4-5. None of the stenoses are severe or are seen to result in definite neural compression.   Shallow left posterolateral disc herniation at L1-2 with caudal migration of a tiny amount of disc material to the left of midline. This does not appear to cause any significant compressive effect upon the neural structures.  COGNITION: Overall cognitive status: Within  functional limits for tasks assessed   SENSATION: WFL   POSTURE: rounded shoulders, forward head, and increased thoracic kyphosis  LOWER EXTREMITY ROM:   WNL LOWER EXTREMITY MMT:    MMT Right Eval Left Eval  Hip flexion 4+ 4+  Hip extension    Hip abduction 5 5  Hip adduction 5 5  Hip internal rotation    Hip external rotation    Knee flexion 5 5  Knee extension 5 5  Ankle dorsiflexion 5 5  Ankle plantarflexion 5 5  Ankle inversion    Ankle eversion    (Blank rows = not tested)  BED MOBILITY: WNL    STAIRS: Level of Assistance: Complete Independence Stair Negotiation Technique: Alternating Pattern  with Bilateral Rails Number of Stairs: 4  Height of Stairs: 6    GAIT: Gait pattern:  forward posture and short step length  bilateral Distance walked: 30 ft Assistive device utilized: None Level of assistance: Complete Independence   FUNCTIONAL TESTS:  5 times sit to stand: 13.07 6 minute walk test: 1414 ft  Mini Best Test:   PATIENT SURVEYS:  Modified Oswestry 10/50  FOTO 70, risk adjusted goal of 75     TODAY'S TREATMENT: DATE: 12/08/22  Unless otherwise stated, CGA was provided and gait belt donned in order to ensure pt safety   Supine:  LTR x 20 bil with 3-5 sec hold Isometric hip addcution x 10  Isometric hip adduction and bride x 8  Supine hip abduction BTB x 5  Hip abduction and bridge BTB x 5  Supine pelvic rock/trunk extension x 8  Seated trunk extension x 10  Seated trunk rotation with slight trunk extension x 8 bil  Seated core activation of TA through diaphragmatic breathing to improve core stability with bracing x 10 with hands below ribs for feedback  Sit<>stand x 5 with initiation of extension, then hip hinge for anterior weight shift. Pt reports no pain with activation of back extensors prior sit<>stand .   Standing with 1 foot on airex pad 3 x 20 sec bil.  Foot tap on hedgehog on airex pad 2 x 10 bil  Tactile cues to maintain  activation in paraspinals to reduce pressure on low back.    PATIENT EDUCATION: Education details: Pt educated throughout session about proper posture and technique with exercises. Improved exercise technique, movement at target joints, use of target muscles after min to mod verbal, visual, tactile cues.   Person educated: Patient Education method: Explanation Education comprehension: verbalized understanding  HOME EXERCISE PROGRAM:Pt to continue HEP as listed below Seated PWR! Moves handout gone over and provided.  Standing PWR! Moves handout reviewed and provided.  All fours modified to chair UE support with PWR! moves handout reviewed and provided    GOALS: Goals reviewed with patient? Yes  SHORT TERM GOALS: Target date: 10/24/2022    Patient will be independent in progressive home exercise program for PD to improve strength/mobility for better functional independence with ADLs. Baseline: No HEP for PD; 09/28/2022 per previous note pt currently perform PWR interventions as part of HEP; 10/31/22: Pt reports he feels comfortable with his HEP Goal status: MET   LONG TERM GOALS: Target date: 12/19/2022    1.  Patient  will complete five times sit to stand test in < 10 seconds indicating an increased LE strength and improved balance. Baseline: 13.07; 10/31/22 10.4 seconds hands-free  Goal status: PARTIALLY MET   2.  Patient will increase FOTO score to equal to or greater than  75   to demonstrate statistically significant improvement in mobility and quality of life.  Baseline: 70; 10/31/2022: 71 Goal status: ONGOING   3.  Patient will increase Mini best Balance score by > 4 points to demonstrate decreased fall risk during functional activities. Baseline: 19; 10/31/22: 23/28 Goal status: PARTIALLY MET   4.   Patient will reduce dual task timed up and go to <11 seconds to reduce fall risk and demonstrate improved transfer/gait ability. Baseline: 12.97l 10/31/22: 8 sec Goal status:  MET  5.   Patient will increase six minute walk test distance to >1000 for progression to community ambulator and improve gait ability Baseline: Test visit 2 09/28/2022: 1414 ft; 10/31/2022: 1628 ft (previously met) Goal status: REVISED/MET 09/28/2022  6. Pt will improve modified oswestry score by 5 points or greater in order to indicate improved function in relation  to his back pain   Baseline: 10/50; 11/01/22: 60%   Goal status: MET    ASSESSMENT:  CLINICAL IMPRESSION: Pt presented to skilled PT eager to participate in therapy. Pt reports continued LPB from flare up on 4th of July after preparing meal and standing bent over at counter throughout the day. PT treatment focused on improved activation of paraspinals and deep cor to improve stability in low back with transitional movements. Pt reports no pain in sit<>stand while actively engaging TA and paraspinals prior to initiating transfer. Cues from PT to maintain activation of postural muscles with dynamic balance training on this day with good cary over.   Pt will continue to benefit from skilled therapy to address remaining deficits in order to improve overall QoL and return to PLOF.      OBJECTIVE IMPAIRMENTS: Abnormal gait, decreased balance, decreased endurance, decreased mobility, difficulty walking, and hypomobility.   ACTIVITY LIMITATIONS: carrying, lifting, bending, standing, squatting, stairs, and locomotion level  PARTICIPATION LIMITATIONS: community activity  PERSONAL FACTORS: Age and 1-2 comorbidities: Asthma  are also affecting patient's functional outcome.   REHAB POTENTIAL: Good  CLINICAL DECISION MAKING: Stable/uncomplicated  EVALUATION COMPLEXITY: Low  PLAN:  PT FREQUENCY: 2x/week  PT DURATION: 12 weeks  PLANNED INTERVENTIONS: Therapeutic exercises, Therapeutic activity, Neuromuscular re-education, Balance training, Gait training, Patient/Family education, Self Care, Joint mobilization, Joint manipulation, Stair  training, Dry Needling, Spinal mobilization, Cryotherapy, Moist heat, and Manual therapy  PLAN FOR NEXT SESSION:    balance, low back pain, and PD specific impairments, emphasis on large arm swing, check in with pt about DN   Grier Rocher PT, DPT  Physical Therapist - [facility]  12:06 PM 12/08/22

## 2022-12-11 ENCOUNTER — Ambulatory Visit: Payer: Medicare Other

## 2022-12-11 DIAGNOSIS — R2681 Unsteadiness on feet: Secondary | ICD-10-CM

## 2022-12-11 DIAGNOSIS — R278 Other lack of coordination: Secondary | ICD-10-CM

## 2022-12-11 DIAGNOSIS — G8929 Other chronic pain: Secondary | ICD-10-CM | POA: Diagnosis not present

## 2022-12-11 DIAGNOSIS — R262 Difficulty in walking, not elsewhere classified: Secondary | ICD-10-CM | POA: Diagnosis not present

## 2022-12-11 DIAGNOSIS — M545 Low back pain, unspecified: Secondary | ICD-10-CM | POA: Diagnosis not present

## 2022-12-11 DIAGNOSIS — M25551 Pain in right hip: Secondary | ICD-10-CM | POA: Diagnosis not present

## 2022-12-11 DIAGNOSIS — M6281 Muscle weakness (generalized): Secondary | ICD-10-CM

## 2022-12-11 DIAGNOSIS — M546 Pain in thoracic spine: Secondary | ICD-10-CM | POA: Diagnosis not present

## 2022-12-11 DIAGNOSIS — M5459 Other low back pain: Secondary | ICD-10-CM | POA: Diagnosis not present

## 2022-12-11 DIAGNOSIS — R293 Abnormal posture: Secondary | ICD-10-CM | POA: Diagnosis not present

## 2022-12-14 ENCOUNTER — Ambulatory Visit: Payer: Medicare Other

## 2022-12-15 ENCOUNTER — Encounter: Payer: Self-pay | Admitting: Physical Therapy

## 2022-12-15 ENCOUNTER — Ambulatory Visit: Payer: Medicare Other

## 2022-12-15 DIAGNOSIS — R2681 Unsteadiness on feet: Secondary | ICD-10-CM | POA: Diagnosis not present

## 2022-12-15 DIAGNOSIS — M25551 Pain in right hip: Secondary | ICD-10-CM | POA: Diagnosis not present

## 2022-12-15 DIAGNOSIS — M6281 Muscle weakness (generalized): Secondary | ICD-10-CM

## 2022-12-15 DIAGNOSIS — G8929 Other chronic pain: Secondary | ICD-10-CM | POA: Diagnosis not present

## 2022-12-15 DIAGNOSIS — R293 Abnormal posture: Secondary | ICD-10-CM | POA: Diagnosis not present

## 2022-12-15 DIAGNOSIS — M5459 Other low back pain: Secondary | ICD-10-CM | POA: Diagnosis not present

## 2022-12-15 DIAGNOSIS — R278 Other lack of coordination: Secondary | ICD-10-CM | POA: Diagnosis not present

## 2022-12-15 DIAGNOSIS — R262 Difficulty in walking, not elsewhere classified: Secondary | ICD-10-CM | POA: Diagnosis not present

## 2022-12-15 DIAGNOSIS — M545 Low back pain, unspecified: Secondary | ICD-10-CM | POA: Diagnosis not present

## 2022-12-15 DIAGNOSIS — M546 Pain in thoracic spine: Secondary | ICD-10-CM | POA: Diagnosis not present

## 2022-12-15 NOTE — Therapy (Signed)
OUTPATIENT PHYSICAL THERAPY NEURO TREATMENT NOTE    Patient Name: Daniel [name] MRN: [id number] DOB:1951-02-13, 72 y.o., male Today's Date: 12/15/2022   PCP:    Denzil Magnuson   REFERRING PROVIDER: Lonell Face, MD   END OF SESSION:  PT End of Session - 12/15/22 0920     Visit Number 21    Number of Visits 24    Date for PT Re-Evaluation 12/19/22    Authorization Type [employer] MEDICARE reporting period from 06/19/2022    Progress Note Due on Visit 10    PT Start Time 0926    PT Stop Time 1010    PT Time Calculation (min) 44 min    Equipment Utilized During Treatment Gait belt    Activity Tolerance Patient tolerated treatment well    Behavior During Therapy [facility] for tasks assessed/performed                Past Medical History:  Diagnosis Date   Actinic keratosis    Asthma    Eczema    Hx of dysplastic nevus 06/06/2006   Mid lower back, slight atypia   Hypothyroidism    Seasonal allergies    Thyroid disease    Past Surgical History:  Procedure Laterality Date   CATARACT EXTRACTION     COLON SURGERY     COLONOSCOPY WITH PROPOFOL N/A 11/18/2014   Procedure: COLONOSCOPY WITH PROPOFOL;  Surgeon: Scot Jun, MD;  Location: [facility] ENDOSCOPY;  Service: Endoscopy;  Laterality: N/A;   COLONOSCOPY WITH PROPOFOL N/A 12/13/2021   Procedure: COLONOSCOPY WITH PROPOFOL;  Surgeon: Regis Bill, MD;  Location: [facility] ENDOSCOPY;  Service: Endoscopy;  Laterality: N/A;   EYE SURGERY     KNEE SURGERY     NASAL SEPTUM SURGERY     Patient Active Problem List   Diagnosis Date Noted   Parkinson's disease 09/26/2022   Chronic idiopathic constipation 06/05/2022   Left lower quadrant abdominal pain 06/05/2022   Paroxysmal atrial fibrillation (HCC) 03/28/2022   Idiopathic hypotension 03/28/2022   Spondylolysis, lumbar region 03/28/2022   Avitaminosis D 03/28/2022   Myalgia due to statin 03/28/2022   Acquired thrombophilia (HCC) 03/28/2022   Hyperlipidemia  03/28/2022   Abnormal gait 03/28/2022   Mild intermittent asthma without complication 03/28/2022   Zenker's (hypopharyngeal) diverticulum 03/22/2018   Gastroesophageal reflux disease 03/22/2018   Fatty liver 10/09/2017   BPH (benign prostatic hyperplasia) 11/21/2014   Hypothyroidism 11/21/2014   Inguinal hernia 11/21/2014   OA (osteoarthritis) 11/21/2014   H/O adenomatous polyp of colon 10/14/2014    ONSET DATE: 04/28/2022 for PD, back pain began around 09/10/21   REFERRING DIAG:  G20.A1 (ICD-10-CM) - Parkinson's disease  M54.9 (ICD-10-CM) - Back pain    THERAPY DIAG:  Other lack of coordination  Muscle weakness (generalized)  Difficulty in walking, not elsewhere classified  Unsteadiness on feet  Rationale for Evaluation and Treatment: Rehabilitation  SUBJECTIVE:  SUBJECTIVE STATEMENT:   Patient reports feeling 0/10 pain in back even after 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   Pt accompanied by: self  PERTINENT HISTORY: Per [name] PT from 09/07/22 PT discharge from Low back treatment for focussed on PD related impairments at our clinic "Patient has attended 40 physical therapy treatment sessions since starting current episode of care on 12/20/2021. Patient at first struggled to make progress towards goals and has struggled at times with consistent attendance. However, over the past few weeks he has been improving with multi-discipline interventions including medications and increasing overall activity. It was also recently confirmed he has Parkinson's that is likely contributing to some of his abnormal posture and difficulty with full ROM during movements. Patient is being discharged today due to improvement in condition and so he can start PT with focus on  rehabilitation techniques for Parkinson's. Patient was provided with a long term HEP to help him with long term management of low back pain."  PAIN:  Are you having pain? Yes: NPRS scale: 5/10 Pain location: R knee  Pain description: sharp  Aggravating factors: when flexing the knee in weight bearing  Relieving factors: PT exercises, movement   PRECAUTIONS: None  WEIGHT BEARING RESTRICTIONS: No  FALLS: Has patient fallen in last 6 months? No  LIVING ENVIRONMENT: Lives with: lives alone Lives in: House/apartment Stairs: Yes: External: 5 steps; on left going up Has following equipment at home: None  PLOF: Independent  PATIENT GOALS: Improve his low back pain, improve posture, improve walking.   OBJECTIVE:   DIAGNOSTIC FINDINGS: MRI of L spine 07/12/21: Degenerative disc disease throughout the lumbar region with loss of disc height. No apparent compressive stenosis of the canal. Discogenic endplate marrow changes without active edema at L2-3, L3-4 and L4-5, which could correlate with regional axial back pain.   Left lateral recess and foraminal narrowing at L2-3, left foraminal narrowing at L3-4, and bilateral lateral recess and foraminal narrowing at L4-5. None of the stenoses are severe or are seen to result in definite neural compression.   Shallow left posterolateral disc herniation at L1-2 with caudal migration of a tiny amount of disc material to the left of midline. This does not appear to cause any significant compressive effect upon the neural structures.  COGNITION: Overall cognitive status: Within functional limits for tasks assessed   SENSATION: WFL   POSTURE: rounded shoulders, forward head, and increased thoracic kyphosis  LOWER EXTREMITY ROM:   WNL LOWER EXTREMITY MMT:    MMT Right Eval Left Eval  Hip flexion 4+ 4+  Hip extension    Hip abduction 5 5  Hip adduction 5 5  Hip internal rotation    Hip external rotation    Knee flexion 5 5  Knee  extension 5 5  Ankle dorsiflexion 5 5  Ankle plantarflexion 5 5  Ankle inversion    Ankle eversion    (Blank rows = not tested)  BED MOBILITY: WNL    STAIRS: Level of Assistance: Complete Independence Stair Negotiation Technique: Alternating Pattern  with Bilateral Rails Number of Stairs: 4  Height of Stairs: 6    GAIT: Gait pattern:  forward posture and short step length bilateral Distance walked: 30 ft Assistive device utilized: None Level of assistance: Complete Independence   FUNCTIONAL TESTS:  5 times sit to stand: 13.07 6 minute walk test: 1414 ft  Mini Best Test:   PATIENT SURVEYS:  Modified Oswestry 10/50  FOTO  70, risk adjusted goal of 75     TODAY'S TREATMENT: DATE: 12/15/22  Unless otherwise stated, CGA was provided and gait belt donned in order to ensure pt safety   NMR:  2 x laps 300' with 4# AW for strengthening of BLE and cardiovascular improvement cross body punches with squat to chair, 1.5# AW added to wrists, feet on airex pad  High marching in // bars x 4 laps, no UE support with 4# AW Standing on airex with hedgehog taps x 15 each LE   Therex: Sit to stand on airex x 15  Standing hip abduction 2 x 15 each LE, 4# AW Standing hamstring curls 2 x 15 each LE, 4# AW   Manual:  General stretching (trunk rotation/FABER/FADIR/knee to chest) x 10 minutes    PATIENT EDUCATION: Education details: Pt educated throughout session about proper posture and technique with exercises. Improved exercise technique, movement at target joints, use of target muscles after min to mod verbal, visual, tactile cues.   Person educated: Patient Education method: Explanation Education comprehension: verbalized understanding  HOME EXERCISE PROGRAM:Pt to continue HEP as listed below Seated PWR! Moves handout gone over and provided.  Standing PWR! Moves handout reviewed and provided.  All fours modified to chair UE support with PWR! moves handout reviewed and  provided Access Code: [email or web address] URL: [email or web address] Date: 12/11/2022 Prepared by: Precious Bard Exercises - Straight Leg Raise with Arm Support  - 1 x daily - 7 x weekly - 2 sets - 10 reps - 5 hold - Single Leg  - 1 x daily - 7 x weekly - 2 sets - 2 reps - 30 hold    GOALS: Goals reviewed with patient? Yes  SHORT TERM GOALS: Target date: 10/24/2022    Patient will be independent in progressive home exercise program for PD to improve strength/mobility for better functional independence with ADLs. Baseline: No HEP for PD; 09/28/2022 per previous note pt currently perform PWR interventions as part of HEP; 10/31/22: Pt reports he feels comfortable with his HEP Goal status: MET   LONG TERM GOALS: Target date: 12/19/2022    1.  Patient  will complete five times sit to stand test in < 10 seconds indicating an increased LE strength and improved balance. Baseline: 13.07; 10/31/22 10.4 seconds hands-free; 12/11/22: 9.45 sec  Goal status:  MET   2.  Patient will increase FOTO score to equal to or greater than  75   to demonstrate statistically significant improvement in mobility and quality of life.  Baseline: 70; 10/31/2022: 71, 12/11/22: 82 Goal status: MET   3.  Patient will increase Mini best Balance score by > 4 points to demonstrate decreased fall risk during functional activities. Baseline: 19; 10/31/22: 23/28; 12/11/22: 25/28 Goal status: MET   4.   Patient will reduce dual task timed up and go to <11 seconds to reduce fall risk and demonstrate improved transfer/gait ability. Baseline: 12.97l 10/31/22: 8 sec Goal status: MET  5.   Patient will increase six minute walk test distance to >1000 for progression to community ambulator and improve gait ability Baseline: Test visit 2 09/28/2022: 1414 ft; 10/31/2022: 1628 ft (previously met) Goal status: REVISED/MET 09/28/2022  6. Pt will improve modified oswestry score by 5 points or greater in order to indicate improved function in  relation to his back pain   Baseline: 10/50; 11/01/22: 60%   Goal status: MET 7. Patient will be able to put his pants on without use of furniture to lean on.  Baseline: unable to hold R leg up > 80 deg hip flexion for longer than 5 seconds without UE support   Goal status: Initial      ASSESSMENT:  CLINICAL IMPRESSION:   Patient arrives to PT session motivated to participate. Session focused on dynamic balance training with emphasis on large amplitude movements and BLE strengthening. Continues to demonstrate weakness and balance deficits which increases fall risk. Patient will continue to benefit from skilled therapy to address remaining deficits in order to improve quality of life and return to PLOF.    OBJECTIVE IMPAIRMENTS: Abnormal gait, decreased balance, decreased endurance, decreased mobility, difficulty walking, and hypomobility.   ACTIVITY LIMITATIONS: carrying, lifting, bending, standing, squatting, stairs, and locomotion level  PARTICIPATION LIMITATIONS: community activity  PERSONAL FACTORS: Age and 1-2 comorbidities: Asthma  are also affecting patient's functional outcome.   REHAB POTENTIAL: Good  CLINICAL DECISION MAKING: Stable/uncomplicated  EVALUATION COMPLEXITY: Low  PLAN:  PT FREQUENCY: 2x/week  PT DURATION: 12 weeks  PLANNED INTERVENTIONS: Therapeutic exercises, Therapeutic activity, Neuromuscular re-education, Balance training, Gait training, Patient/Family education, Self Care, Joint mobilization, Joint manipulation, Stair training, Dry Needling, Spinal mobilization, Cryotherapy, Moist heat, and Manual therapy  PLAN FOR NEXT SESSION:  balance, low back pain, and PD specific impairments, emphasis on large arm swing, work on L SL stance with R hip flexion > 80 deg ROM with less UE dependence    Maylon Peppers, PT, DPT Physical Therapist - [facility]  Outpatient Physical Therapy- Main Campus [phone]     12/15/22,  10:16 AM

## 2022-12-18 ENCOUNTER — Ambulatory Visit: Payer: Medicare Other

## 2022-12-19 DIAGNOSIS — I480 Paroxysmal atrial fibrillation: Secondary | ICD-10-CM | POA: Diagnosis not present

## 2022-12-19 DIAGNOSIS — I950 Idiopathic hypotension: Secondary | ICD-10-CM | POA: Diagnosis not present

## 2022-12-19 DIAGNOSIS — E782 Mixed hyperlipidemia: Secondary | ICD-10-CM | POA: Diagnosis not present

## 2022-12-19 DIAGNOSIS — R001 Bradycardia, unspecified: Secondary | ICD-10-CM | POA: Diagnosis not present

## 2022-12-21 ENCOUNTER — Ambulatory Visit: Payer: Medicare Other

## 2022-12-21 DIAGNOSIS — R278 Other lack of coordination: Secondary | ICD-10-CM

## 2022-12-21 DIAGNOSIS — M6281 Muscle weakness (generalized): Secondary | ICD-10-CM

## 2022-12-21 DIAGNOSIS — R293 Abnormal posture: Secondary | ICD-10-CM | POA: Diagnosis not present

## 2022-12-21 DIAGNOSIS — M546 Pain in thoracic spine: Secondary | ICD-10-CM | POA: Diagnosis not present

## 2022-12-21 DIAGNOSIS — M5459 Other low back pain: Secondary | ICD-10-CM | POA: Diagnosis not present

## 2022-12-21 DIAGNOSIS — M25551 Pain in right hip: Secondary | ICD-10-CM | POA: Diagnosis not present

## 2022-12-21 DIAGNOSIS — R262 Difficulty in walking, not elsewhere classified: Secondary | ICD-10-CM | POA: Diagnosis not present

## 2022-12-21 DIAGNOSIS — M545 Low back pain, unspecified: Secondary | ICD-10-CM | POA: Diagnosis not present

## 2022-12-21 DIAGNOSIS — R2681 Unsteadiness on feet: Secondary | ICD-10-CM | POA: Diagnosis not present

## 2022-12-21 DIAGNOSIS — G8929 Other chronic pain: Secondary | ICD-10-CM | POA: Diagnosis not present

## 2022-12-21 NOTE — Therapy (Addendum)
OUTPATIENT PHYSICAL THERAPY NEURO TREATMENT NOTE /RECERT   Patient Name: Daniel [name] MRN: [id number] DOB:10-23-1950, 72 y.o., male Today's Date: 01/02/2023   PCP:    Denzil Magnuson   REFERRING PROVIDER: Lonell Face, MD   END OF SESSION:  PT End of Session - 01/02/23 1627     Visit Number 22    Number of Visits 46    Date for PT Re-Evaluation 03/15/23    Authorization Type [employer] MEDICARE reporting period from 06/19/2022    Progress Note Due on Visit 10    PT Start Time 1617    PT Stop Time 1650    PT Time Calculation (min) 33 min    Equipment Utilized During Treatment Gait belt    Activity Tolerance Patient tolerated treatment well    Behavior During Therapy WFL for tasks assessed/performed                Past Medical History:  Diagnosis Date   Actinic keratosis    Asthma    Eczema    Hx of dysplastic nevus 06/06/2006   Mid lower back, slight atypia   Hypothyroidism    Seasonal allergies    Thyroid disease    Past Surgical History:  Procedure Laterality Date   CATARACT EXTRACTION     COLON SURGERY     COLONOSCOPY WITH PROPOFOL N/A 11/18/2014   Procedure: COLONOSCOPY WITH PROPOFOL;  Surgeon: Scot Jun, MD;  Location: [facility] ENDOSCOPY;  Service: Endoscopy;  Laterality: N/A;   COLONOSCOPY WITH PROPOFOL N/A 12/13/2021   Procedure: COLONOSCOPY WITH PROPOFOL;  Surgeon: Regis Bill, MD;  Location: [facility] ENDOSCOPY;  Service: Endoscopy;  Laterality: N/A;   EYE SURGERY     KNEE SURGERY     NASAL SEPTUM SURGERY     Patient Active Problem List   Diagnosis Date Noted   Parkinson's disease 09/26/2022   Chronic idiopathic constipation 06/05/2022   Left lower quadrant abdominal pain 06/05/2022   Paroxysmal atrial fibrillation (HCC) 03/28/2022   Idiopathic hypotension 03/28/2022   Spondylolysis, lumbar region 03/28/2022   Avitaminosis D 03/28/2022   Myalgia due to statin 03/28/2022   Acquired thrombophilia (HCC) 03/28/2022   Hyperlipidemia  03/28/2022   Abnormal gait 03/28/2022   Mild intermittent asthma without complication 03/28/2022   Zenker's (hypopharyngeal) diverticulum 03/22/2018   Gastroesophageal reflux disease 03/22/2018   Fatty liver 10/09/2017   BPH (benign prostatic hyperplasia) 11/21/2014   Hypothyroidism 11/21/2014   Inguinal hernia 11/21/2014   OA (osteoarthritis) 11/21/2014   H/O adenomatous polyp of colon 10/14/2014    ONSET DATE: 04/28/2022 for PD, back pain began around 09/10/21   REFERRING DIAG:  G20.A1 (ICD-10-CM) - Parkinson's disease  M54.9 (ICD-10-CM) - Back pain    THERAPY DIAG:  Muscle weakness (generalized) - Plan: PT plan of care cert/re-cert  Difficulty in walking, not elsewhere classified - Plan: PT plan of care cert/re-cert  Other lack of coordination - Plan: PT plan of care cert/re-cert  Rationale for Evaluation and Treatment: Rehabilitation  SUBJECTIVE:  SUBJECTIVE STATEMENT:   Pt reports his back is not bad.  He says his knees are a little sore. Pt with no other updates.  Pt accompanied by: self  PERTINENT HISTORY: Per [name] PT from 09/07/22 PT discharge from Low back treatment for focussed on PD related impairments at our clinic "Patient has attended 40 physical therapy treatment sessions since starting current episode of care on 12/20/2021. Patient at first struggled to make progress towards goals and has struggled at times with consistent attendance. However, over the past few weeks he has been improving with multi-discipline interventions including medications and increasing overall activity. It was also recently confirmed he has Parkinson's that is likely contributing to some of his abnormal posture and difficulty with full ROM during movements. Patient is being discharged today due to  improvement in condition and so he can start PT with focus on rehabilitation techniques for Parkinson's. Patient was provided with a long term HEP to help him with long term management of low back pain."  PAIN:  Are you having pain? Yes: NPRS scale: 5/10 Pain location: R knee  Pain description: sharp  Aggravating factors: when flexing the knee in weight bearing  Relieving factors: PT exercises, movement   PRECAUTIONS: None  WEIGHT BEARING RESTRICTIONS: No  FALLS: Has patient fallen in last 6 months? No  LIVING ENVIRONMENT: Lives with: lives alone Lives in: House/apartment Stairs: Yes: External: 5 steps; on left going up Has following equipment at home: None  PLOF: Independent  PATIENT GOALS: Improve his low back pain, improve posture, improve walking.   OBJECTIVE:   DIAGNOSTIC FINDINGS: MRI of L spine 07/12/21: Degenerative disc disease throughout the lumbar region with loss of disc height. No apparent compressive stenosis of the canal. Discogenic endplate marrow changes without active edema at L2-3, L3-4 and L4-5, which could correlate with regional axial back pain.   Left lateral recess and foraminal narrowing at L2-3, left foraminal narrowing at L3-4, and bilateral lateral recess and foraminal narrowing at L4-5. None of the stenoses are severe or are seen to result in definite neural compression.   Shallow left posterolateral disc herniation at L1-2 with caudal migration of a tiny amount of disc material to the left of midline. This does not appear to cause any significant compressive effect upon the neural structures.  COGNITION: Overall cognitive status: Within functional limits for tasks assessed   SENSATION: WFL   POSTURE: rounded shoulders, forward head, and increased thoracic kyphosis  LOWER EXTREMITY ROM:   WNL LOWER EXTREMITY MMT:    MMT Right Eval Left Eval  Hip flexion 4+ 4+  Hip extension    Hip abduction 5 5  Hip adduction 5 5  Hip internal  rotation    Hip external rotation    Knee flexion 5 5  Knee extension 5 5  Ankle dorsiflexion 5 5  Ankle plantarflexion 5 5  Ankle inversion    Ankle eversion    (Blank rows = not tested)  BED MOBILITY: WNL    STAIRS: Level of Assistance: Complete Independence Stair Negotiation Technique: Alternating Pattern  with Bilateral Rails Number of Stairs: 4  Height of Stairs: 6    GAIT: Gait pattern:  forward posture and short step length bilateral Distance walked: 30 ft Assistive device utilized: None Level of assistance: Complete Independence   FUNCTIONAL TESTS:  5 times sit to stand: 13.07 6 minute walk test: 1414 ft  Mini Best Test:   PATIENT SURVEYS:  Modified Oswestry 10/50  FOTO 70, risk  adjusted goal of 75     TODAY'S TREATMENT: DATE: 01/02/23  Unless otherwise stated, CGA was provided and gait belt donned in order to ensure pt safety   Therex: Sit to stand  1x 15, 1x11   Standing hip abduction 1 x 12 each 5# AW Standing hamstring curls 1x 15, 1x10, 1x6 each LE, 5# AW  Stability ball rollouts FWD/BCKWD 15x then LTL each way 15x  As many step taps as possible onto 6" step within 60 secs x 2 round (5# weights donned)- second set limited within last 15 seconds due to knee pain. Pt takes rest break following intervention. Figure four stretch 2x30 sec each LE  NMR:   Gait with large amplitude technique, focus on arm-swing, upright posture 2x 80 ftwithout wrist weights, 4x80 ft with 1.5# wrist weights for TC, then another 2x80 ft with cog dual task.    PATIENT EDUCATION: Education details: Pt educated throughout session about proper posture and technique with exercises. Improved exercise technique, movement at target joints, use of target muscles after min to mod verbal, visual, tactile cues.   Person educated: Patient Education method: Explanation Education comprehension: verbalized understanding  HOME EXERCISE PROGRAM:Pt to continue HEP as listed  below Seated PWR! Moves handout gone over and provided.  Standing PWR! Moves handout reviewed and provided.  All fours modified to chair UE support with PWR! moves handout reviewed and provided Access Code: [email or web address] URL: [email or web address] Date: 12/11/2022 Prepared by: Precious Bard Exercises - Straight Leg Raise with Arm Support  - 1 x daily - 7 x weekly - 2 sets - 10 reps - 5 hold - Single Leg  - 1 x daily - 7 x weekly - 2 sets - 2 reps - 30 hold    GOALS: Goals reviewed with patient? Yes  SHORT TERM GOALS: Target date: 10/24/2022    Patient will be independent in progressive home exercise program for PD to improve strength/mobility for better functional independence with ADLs. Baseline: No HEP for PD; 09/28/2022 per previous note pt currently perform PWR interventions as part of HEP; 10/31/22: Pt reports he feels comfortable with his HEP Goal status: MET   LONG TERM GOALS: Target date: 12/19/2022    1.  Patient  will complete five times sit to stand test in < 10 seconds indicating an increased LE strength and improved balance. Baseline: 13.07; 10/31/22 10.4 seconds hands-free; 12/11/22: 9.45 sec  Goal status:  MET   2.  Patient will increase FOTO score to equal to or greater than  75   to demonstrate statistically significant improvement in mobility and quality of life.  Baseline: 70; 10/31/2022: 71, 12/11/22: 82 Goal status: MET   3.  Patient will increase Mini best Balance score by > 4 points to demonstrate decreased fall risk during functional activities. Baseline: 19; 10/31/22: 23/28; 12/11/22: 25/28 Goal status: MET   4.   Patient will reduce dual task timed up and go to <11 seconds to reduce fall risk and demonstrate improved transfer/gait ability. Baseline: 12.97l 10/31/22: 8 sec Goal status: MET  5.   Patient will increase six minute walk test distance to >1000 for progression to community ambulator and improve gait ability Baseline: Test visit 2 09/28/2022: 1414 ft;  10/31/2022: 1628 ft (previously met) Goal status: REVISED/MET 09/28/2022  6. Pt will improve modified oswestry score by 5 points or greater in order to indicate improved function in relation to his back pain   Baseline: 10/50; 11/01/22: 60%   Goal status: MET 7.  Patient will be able to put his pants on without use of furniture to lean on.   Baseline: unable to hold R leg up > 80 deg hip flexion for longer than 5 seconds without UE support   Goal status: Initial      ASSESSMENT:  CLINICAL IMPRESSION:   Pt presents for recertification visit. Goals were recently reassessed 12/11/2022, please refer to PT note from this date for full details as it showed pt making progress with balance and functional mobility. PT continued plan to address strength and continued focus on improve gait mechanics, particularly increasing amplitude of arm-swing. Patient's condition has the potential to improve in response to therapy. Maximum improvement is yet to be obtained. The anticipated improvement is attainable and reasonable in a generally predictable time.  Patient will continue to benefit from skilled therapy to address remaining deficits in order to improve quality of life and return to PLOF.    OBJECTIVE IMPAIRMENTS: Abnormal gait, decreased balance, decreased endurance, decreased mobility, difficulty walking, and hypomobility.   ACTIVITY LIMITATIONS: carrying, lifting, bending, standing, squatting, stairs, and locomotion level  PARTICIPATION LIMITATIONS: community activity  PERSONAL FACTORS: Age and 1-2 comorbidities: Asthma  are also affecting patient's functional outcome.   REHAB POTENTIAL: Good  CLINICAL DECISION MAKING: Stable/uncomplicated  EVALUATION COMPLEXITY: Low  PLAN:  PT FREQUENCY: 2x/week  PT DURATION: 12 weeks  PLANNED INTERVENTIONS: Therapeutic exercises, Therapeutic activity, Neuromuscular re-education, Balance training, Gait training, Patient/Family education, Self Care, Joint  mobilization, Joint manipulation, Stair training, Dry Needling, Spinal mobilization, Cryotherapy, Moist heat, and Manual therapy  PLAN FOR NEXT SESSION:  balance, low back pain, and PD specific impairments, emphasis on large arm swing, work on L SL stance with R hip flexion > 80 deg ROM with less UE dependence, continue plan  Temple Pacini PT, DPT Physical Therapist - [facility]  Outpatient Physical Therapy- Main Campus [phone]     01/02/23, 4:30 PM

## 2022-12-25 ENCOUNTER — Ambulatory Visit: Payer: Medicare Other

## 2022-12-25 DIAGNOSIS — R2681 Unsteadiness on feet: Secondary | ICD-10-CM | POA: Diagnosis not present

## 2022-12-25 DIAGNOSIS — M546 Pain in thoracic spine: Secondary | ICD-10-CM | POA: Diagnosis not present

## 2022-12-25 DIAGNOSIS — R262 Difficulty in walking, not elsewhere classified: Secondary | ICD-10-CM

## 2022-12-25 DIAGNOSIS — G8929 Other chronic pain: Secondary | ICD-10-CM | POA: Diagnosis not present

## 2022-12-25 DIAGNOSIS — R293 Abnormal posture: Secondary | ICD-10-CM | POA: Diagnosis not present

## 2022-12-25 DIAGNOSIS — M545 Low back pain, unspecified: Secondary | ICD-10-CM | POA: Diagnosis not present

## 2022-12-25 DIAGNOSIS — R278 Other lack of coordination: Secondary | ICD-10-CM | POA: Diagnosis not present

## 2022-12-25 DIAGNOSIS — M25551 Pain in right hip: Secondary | ICD-10-CM | POA: Diagnosis not present

## 2022-12-25 DIAGNOSIS — M6281 Muscle weakness (generalized): Secondary | ICD-10-CM | POA: Diagnosis not present

## 2022-12-25 DIAGNOSIS — M5459 Other low back pain: Secondary | ICD-10-CM | POA: Diagnosis not present

## 2022-12-25 NOTE — Therapy (Unsigned)
OUTPATIENT PHYSICAL THERAPY NEURO TREATMENT NOTE   Patient Name: Daniel [name] MRN: [id number] DOB:[birth year], 72 y.o., male Today's Date: 12/26/2022   PCP:    Denzil Magnuson   REFERRING PROVIDER: Lonell Face, MD   END OF SESSION:  PT End of Session - 12/25/22 1406     Visit Number 23    Number of Visits 24    Date for PT Re-Evaluation 03/15/23    Authorization Type [employer] MEDICARE reporting period from 06/19/2022    Progress Note Due on Visit 10    PT Start Time 1405    PT Stop Time 1444    PT Time Calculation (min) 39 min    Equipment Utilized During Treatment Gait belt    Activity Tolerance Patient tolerated treatment well    Behavior During Therapy [facility] for tasks assessed/performed                 Past Medical History:  Diagnosis Date   Actinic keratosis    Asthma    Eczema    Hx of dysplastic nevus 06/06/2006   Mid lower back, slight atypia   Hypothyroidism    Seasonal allergies    Thyroid disease    Past Surgical History:  Procedure Laterality Date   CATARACT EXTRACTION     COLON SURGERY     COLONOSCOPY WITH PROPOFOL N/A 11/18/2014   Procedure: COLONOSCOPY WITH PROPOFOL;  Surgeon: Scot Jun, MD;  Location: [facility] ENDOSCOPY;  Service: Endoscopy;  Laterality: N/A;   COLONOSCOPY WITH PROPOFOL N/A 12/13/2021   Procedure: COLONOSCOPY WITH PROPOFOL;  Surgeon: Regis Bill, MD;  Location: [facility] ENDOSCOPY;  Service: Endoscopy;  Laterality: N/A;   EYE SURGERY     KNEE SURGERY     NASAL SEPTUM SURGERY     Patient Active Problem List   Diagnosis Date Noted   Parkinson's disease 09/26/2022   Chronic idiopathic constipation 06/05/2022   Left lower quadrant abdominal pain 06/05/2022   Paroxysmal atrial fibrillation (HCC) 03/28/2022   Idiopathic hypotension 03/28/2022   Spondylolysis, lumbar region 03/28/2022   Avitaminosis D 03/28/2022   Myalgia due to statin 03/28/2022   Acquired thrombophilia (HCC) 03/28/2022   Hyperlipidemia  03/28/2022   Abnormal gait 03/28/2022   Mild intermittent asthma without complication 03/28/2022   Zenker's (hypopharyngeal) diverticulum 03/22/2018   Gastroesophageal reflux disease 03/22/2018   Fatty liver 10/09/2017   BPH (benign prostatic hyperplasia) 11/21/2014   Hypothyroidism 11/21/2014   Inguinal hernia 11/21/2014   OA (osteoarthritis) 11/21/2014   H/O adenomatous polyp of colon 10/14/2014    ONSET DATE: 04/28/2022 for PD, back pain began around 09/10/21   REFERRING DIAG:  G20.A1 (ICD-10-CM) - Parkinson's disease  M54.9 (ICD-10-CM) - Back pain    THERAPY DIAG:  Other lack of coordination  Difficulty in walking, not elsewhere classified  Unsteadiness on feet  Rationale for Evaluation and Treatment: Rehabilitation  SUBJECTIVE:  SUBJECTIVE STATEMENT:   Pt presents wearing R knee brace. He says back is pretty good currently. Pt reports no falls or stumbles.   Pt accompanied by: self  PERTINENT HISTORY: Per [name] PT from 09/07/22 PT discharge from Low back treatment for focussed on PD related impairments at our clinic "Patient has attended 40 physical therapy treatment sessions since starting current episode of care on 12/20/2021. Patient at first struggled to make progress towards goals and has struggled at times with consistent attendance. However, over the past few weeks he has been improving with multi-discipline interventions including medications and increasing overall activity. It was also recently confirmed he has Parkinson's that is likely contributing to some of his abnormal posture and difficulty with full ROM during movements. Patient is being discharged today due to improvement in condition and so he can start PT with focus on rehabilitation techniques for Parkinson's. Patient  was provided with a long term HEP to help him with long term management of low back pain."  PAIN:  Are you having pain? Yes: NPRS scale: 5/10 Pain location: R knee  Pain description: sharp  Aggravating factors: when flexing the knee in weight bearing  Relieving factors: PT exercises, movement   PRECAUTIONS: None  WEIGHT BEARING RESTRICTIONS: No  FALLS: Has patient fallen in last 6 months? No  LIVING ENVIRONMENT: Lives with: lives alone Lives in: House/apartment Stairs: Yes: External: 5 steps; on left going up Has following equipment at home: None  PLOF: Independent  PATIENT GOALS: Improve his low back pain, improve posture, improve walking.   OBJECTIVE:   DIAGNOSTIC FINDINGS: MRI of L spine 07/12/21: Degenerative disc disease throughout the lumbar region with loss of disc height. No apparent compressive stenosis of the canal. Discogenic endplate marrow changes without active edema at L2-3, L3-4 and L4-5, which could correlate with regional axial back pain.   Left lateral recess and foraminal narrowing at L2-3, left foraminal narrowing at L3-4, and bilateral lateral recess and foraminal narrowing at L4-5. None of the stenoses are severe or are seen to result in definite neural compression.   Shallow left posterolateral disc herniation at L1-2 with caudal migration of a tiny amount of disc material to the left of midline. This does not appear to cause any significant compressive effect upon the neural structures.  COGNITION: Overall cognitive status: Within functional limits for tasks assessed   SENSATION: WFL   POSTURE: rounded shoulders, forward head, and increased thoracic kyphosis  LOWER EXTREMITY ROM:   WNL LOWER EXTREMITY MMT:    MMT Right Eval Left Eval  Hip flexion 4+ 4+  Hip extension    Hip abduction 5 5  Hip adduction 5 5  Hip internal rotation    Hip external rotation    Knee flexion 5 5  Knee extension 5 5  Ankle dorsiflexion 5 5  Ankle  plantarflexion 5 5  Ankle inversion    Ankle eversion    (Blank rows = not tested)  BED MOBILITY: WNL    STAIRS: Level of Assistance: Complete Independence Stair Negotiation Technique: Alternating Pattern  with Bilateral Rails Number of Stairs: 4  Height of Stairs: 6    GAIT: Gait pattern:  forward posture and short step length bilateral Distance walked: 30 ft Assistive device utilized: None Level of assistance: Complete Independence   FUNCTIONAL TESTS:  5 times sit to stand: 13.07 6 minute walk test: 1414 ft  Mini Best Test:   PATIENT SURVEYS:  Modified Oswestry 10/50  FOTO 70, risk adjusted  goal of 75     TODAY'S TREATMENT: DATE: 12/26/22  Unless otherwise stated, CGA was provided and gait belt donned in order to ensure pt safety   NMR:   Gait with large amplitude technique using PVC on and off for reps, focus on arm-swing, upright posture 4x148 ft. Use of 2# wrist weights. Reports fatigue felt neck/shoulders.  PT alternates using PVC with sets (first set with, second set without, etc.)  Large amplitude STS with trunk twist (standing on foam) 10x. Rates moderate challenging, reports some concern about losing balance on airex, but otherwise maintains stability  Large amplitude step and reach with UE FWD 2x10 each side  Large amplitude step and reach with UE LTL 2x10 each side Comments: mild decrease in balance  Large amplitude bow with bilat shoulder ext and retro-step 2x10 each LE Mild decrease in balance   Pt required multiple breaks to take phone calls during session.     PATIENT EDUCATION: Education details: Pt educated throughout session about proper posture and technique with exercises. Improved exercise technique, movement at target joints, use of target muscles after min to mod verbal, visual, tactile cues.   Person educated: Patient Education method: Explanation Education comprehension: verbalized understanding  HOME EXERCISE PROGRAM:Pt to  continue HEP as listed below Seated PWR! Moves handout gone over and provided.  Standing PWR! Moves handout reviewed and provided.  All fours modified to chair UE support with PWR! moves handout reviewed and provided Access Code: [email or web address] URL: [email or web address] Date: 12/11/2022 Prepared by: Precious Bard Exercises - Straight Leg Raise with Arm Support  - 1 x daily - 7 x weekly - 2 sets - 10 reps - 5 hold - Single Leg  - 1 x daily - 7 x weekly - 2 sets - 2 reps - 30 hold    GOALS: Goals reviewed with patient? Yes  SHORT TERM GOALS: Target date: 10/24/2022    Patient will be independent in progressive home exercise program for PD to improve strength/mobility for better functional independence with ADLs. Baseline: No HEP for PD; 09/28/2022 per previous note pt currently perform PWR interventions as part of HEP; 10/31/22: Pt reports he feels comfortable with his HEP Goal status: MET   LONG TERM GOALS: Target date: 12/19/2022    1.  Patient  will complete five times sit to stand test in < 10 seconds indicating an increased LE strength and improved balance. Baseline: 13.07; 10/31/22 10.4 seconds hands-free; 12/11/22: 9.45 sec  Goal status:  MET   2.  Patient will increase FOTO score to equal to or greater than  75   to demonstrate statistically significant improvement in mobility and quality of life.  Baseline: 70; 10/31/2022: 71, 12/11/22: 82 Goal status: MET   3.  Patient will increase Mini best Balance score by > 4 points to demonstrate decreased fall risk during functional activities. Baseline: 19; 10/31/22: 23/28; 12/11/22: 25/28 Goal status: MET   4.   Patient will reduce dual task timed up and go to <11 seconds to reduce fall risk and demonstrate improved transfer/gait ability. Baseline: 12.97l 10/31/22: 8 sec Goal status: MET  5.   Patient will increase six minute walk test distance to >1000 for progression to community ambulator and improve gait ability Baseline: Test  visit 2 09/28/2022: 1414 ft; 10/31/2022: 1628 ft (previously met) Goal status: REVISED/MET 09/28/2022  6. Pt will improve modified oswestry score by 5 points or greater in order to indicate improved function in relation to his back pain  Baseline: 10/50; 11/01/22: 60%   Goal status: MET 7. Patient will be able to put his pants on without use of furniture to lean on.   Baseline: unable to hold R leg up > 80 deg hip flexion for longer than 5 seconds without UE support   Goal status: Initial     ASSESSMENT:  CLINICAL IMPRESSION:   Session somewhat limited as pt required to take multiple breaks for phone calls today. Continued focus on large-amplitude dynamic movements. PT observed pt with improved ability to sustain arm-swing, particularly L arm swing, indicating within-session carryover. Pt did have have some difficulty with balance with majority of interventions, however.  Patient will continue to benefit from skilled therapy to address remaining deficits in order to improve quality of life and return to PLOF.    OBJECTIVE IMPAIRMENTS: Abnormal gait, decreased balance, decreased endurance, decreased mobility, difficulty walking, and hypomobility.   ACTIVITY LIMITATIONS: carrying, lifting, bending, standing, squatting, stairs, and locomotion level  PARTICIPATION LIMITATIONS: community activity  PERSONAL FACTORS: Age and 1-2 comorbidities: Asthma  are also affecting patient's functional outcome.   REHAB POTENTIAL: Good  CLINICAL DECISION MAKING: Stable/uncomplicated  EVALUATION COMPLEXITY: Low  PLAN:  PT FREQUENCY: 2x/week  PT DURATION: 12 weeks  PLANNED INTERVENTIONS: Therapeutic exercises, Therapeutic activity, Neuromuscular re-education, Balance training, Gait training, Patient/Family education, Self Care, Joint mobilization, Joint manipulation, Stair training, Dry Needling, Spinal mobilization, Cryotherapy, Moist heat, and Manual therapy  PLAN FOR NEXT SESSION:  balance, low back  pain, and PD specific impairments, emphasis on large arm swing, work on L SL stance with R hip flexion > 80 deg ROM with less UE dependence, continue plan  Temple Pacini PT, DPT Physical Therapist - [facility]  Outpatient Physical Therapy- Main Campus [phone]     12/26/22, 9:07 AM

## 2022-12-27 ENCOUNTER — Ambulatory Visit: Payer: Medicare Other

## 2022-12-28 ENCOUNTER — Ambulatory Visit: Payer: Medicare Other | Attending: Neurology

## 2022-12-28 DIAGNOSIS — R2681 Unsteadiness on feet: Secondary | ICD-10-CM | POA: Insufficient documentation

## 2022-12-28 DIAGNOSIS — R293 Abnormal posture: Secondary | ICD-10-CM | POA: Diagnosis not present

## 2022-12-28 DIAGNOSIS — M25551 Pain in right hip: Secondary | ICD-10-CM | POA: Insufficient documentation

## 2022-12-28 DIAGNOSIS — M545 Low back pain, unspecified: Secondary | ICD-10-CM | POA: Diagnosis not present

## 2022-12-28 DIAGNOSIS — M546 Pain in thoracic spine: Secondary | ICD-10-CM | POA: Insufficient documentation

## 2022-12-28 DIAGNOSIS — R278 Other lack of coordination: Secondary | ICD-10-CM | POA: Insufficient documentation

## 2022-12-28 DIAGNOSIS — M6281 Muscle weakness (generalized): Secondary | ICD-10-CM | POA: Insufficient documentation

## 2022-12-28 DIAGNOSIS — G8929 Other chronic pain: Secondary | ICD-10-CM | POA: Diagnosis not present

## 2022-12-28 DIAGNOSIS — M5459 Other low back pain: Secondary | ICD-10-CM | POA: Insufficient documentation

## 2022-12-28 DIAGNOSIS — R262 Difficulty in walking, not elsewhere classified: Secondary | ICD-10-CM | POA: Diagnosis not present

## 2022-12-28 DIAGNOSIS — R2689 Other abnormalities of gait and mobility: Secondary | ICD-10-CM | POA: Insufficient documentation

## 2022-12-28 NOTE — Therapy (Signed)
OUTPATIENT PHYSICAL THERAPY NEURO TREATMENT NOTE   Patient Name: Daniel [name] MRN: [id number] DOB:Oct 03, 1950, 72 y.o., male Today's Date: 12/29/2022   PCP:    Denzil Magnuson   REFERRING PROVIDER: Lonell Face, MD   END OF SESSION:  PT End of Session - 12/28/22 1728     Visit Number 24    Number of Visits 24    Date for PT Re-Evaluation 03/15/23    Authorization Type [employer] MEDICARE reporting period from 06/19/2022    Progress Note Due on Visit 10    PT Start Time 0847    PT Stop Time 0930    PT Time Calculation (min) 43 min    Equipment Utilized During Treatment Gait belt    Activity Tolerance Patient tolerated treatment well    Behavior During Therapy [facility] for tasks assessed/performed                  Past Medical History:  Diagnosis Date   Actinic keratosis    Asthma    Eczema    Hx of dysplastic nevus 06/06/2006   Mid lower back, slight atypia   Hypothyroidism    Seasonal allergies    Thyroid disease    Past Surgical History:  Procedure Laterality Date   CATARACT EXTRACTION     COLON SURGERY     COLONOSCOPY WITH PROPOFOL N/A 11/18/2014   Procedure: COLONOSCOPY WITH PROPOFOL;  Surgeon: Scot Jun, MD;  Location: [facility] ENDOSCOPY;  Service: Endoscopy;  Laterality: N/A;   COLONOSCOPY WITH PROPOFOL N/A 12/13/2021   Procedure: COLONOSCOPY WITH PROPOFOL;  Surgeon: Regis Bill, MD;  Location: [facility] ENDOSCOPY;  Service: Endoscopy;  Laterality: N/A;   EYE SURGERY     KNEE SURGERY     NASAL SEPTUM SURGERY     Patient Active Problem List   Diagnosis Date Noted   Parkinson's disease 09/26/2022   Chronic idiopathic constipation 06/05/2022   Left lower quadrant abdominal pain 06/05/2022   Paroxysmal atrial fibrillation (HCC) 03/28/2022   Idiopathic hypotension 03/28/2022   Spondylolysis, lumbar region 03/28/2022   Avitaminosis D 03/28/2022   Myalgia due to statin 03/28/2022   Acquired thrombophilia (HCC) 03/28/2022   Hyperlipidemia  03/28/2022   Abnormal gait 03/28/2022   Mild intermittent asthma without complication 03/28/2022   Zenker's (hypopharyngeal) diverticulum 03/22/2018   Gastroesophageal reflux disease 03/22/2018   Fatty liver 10/09/2017   BPH (benign prostatic hyperplasia) 11/21/2014   Hypothyroidism 11/21/2014   Inguinal hernia 11/21/2014   OA (osteoarthritis) 11/21/2014   H/O adenomatous polyp of colon 10/14/2014    ONSET DATE: 04/28/2022 for PD, back pain began around 09/10/21   REFERRING DIAG:  G20.A1 (ICD-10-CM) - Parkinson's disease  M54.9 (ICD-10-CM) - Back pain    THERAPY DIAG:  Other abnormalities of gait and mobility  Other lack of coordination  Unsteadiness on feet  Abnormal posture  Muscle weakness (generalized)  Rationale for Evaluation and Treatment: Rehabilitation  SUBJECTIVE:  SUBJECTIVE STATEMENT:   Pt reports some hip pain this morning, has eased off but it still hurts. No back pain currently. No stumbles/falls.     Pt accompanied by: self  PERTINENT HISTORY: Per [name] PT from 09/07/22 PT discharge from Low back treatment for focussed on PD related impairments at our clinic "Patient has attended 40 physical therapy treatment sessions since starting current episode of care on 12/20/2021. Patient at first struggled to make progress towards goals and has struggled at times with consistent attendance. However, over the past few weeks he has been improving with multi-discipline interventions including medications and increasing overall activity. It was also recently confirmed he has Parkinson's that is likely contributing to some of his abnormal posture and difficulty with full ROM during movements. Patient is being discharged today due to improvement in condition and so he can start PT with  focus on rehabilitation techniques for Parkinson's. Patient was provided with a long term HEP to help him with long term management of low back pain."  PAIN:  Are you having pain? Yes: NPRS scale: 5/10 Pain location: R knee  Pain description: sharp  Aggravating factors: when flexing the knee in weight bearing  Relieving factors: PT exercises, movement   PRECAUTIONS: None  WEIGHT BEARING RESTRICTIONS: No  FALLS: Has patient fallen in last 6 months? No  LIVING ENVIRONMENT: Lives with: lives alone Lives in: House/apartment Stairs: Yes: External: 5 steps; on left going up Has following equipment at home: None  PLOF: Independent  PATIENT GOALS: Improve his low back pain, improve posture, improve walking.   OBJECTIVE:   DIAGNOSTIC FINDINGS: MRI of L spine 07/12/21: Degenerative disc disease throughout the lumbar region with loss of disc height. No apparent compressive stenosis of the canal. Discogenic endplate marrow changes without active edema at L2-3, L3-4 and L4-5, which could correlate with regional axial back pain.   Left lateral recess and foraminal narrowing at L2-3, left foraminal narrowing at L3-4, and bilateral lateral recess and foraminal narrowing at L4-5. None of the stenoses are severe or are seen to result in definite neural compression.   Shallow left posterolateral disc herniation at L1-2 with caudal migration of a tiny amount of disc material to the left of midline. This does not appear to cause any significant compressive effect upon the neural structures.  COGNITION: Overall cognitive status: Within functional limits for tasks assessed   SENSATION: WFL   POSTURE: rounded shoulders, forward head, and increased thoracic kyphosis  LOWER EXTREMITY ROM:   WNL LOWER EXTREMITY MMT:    MMT Right Eval Left Eval  Hip flexion 4+ 4+  Hip extension    Hip abduction 5 5  Hip adduction 5 5  Hip internal rotation    Hip external rotation    Knee flexion 5  5  Knee extension 5 5  Ankle dorsiflexion 5 5  Ankle plantarflexion 5 5  Ankle inversion    Ankle eversion    (Blank rows = not tested)  BED MOBILITY: WNL    STAIRS: Level of Assistance: Complete Independence Stair Negotiation Technique: Alternating Pattern  with Bilateral Rails Number of Stairs: 4  Height of Stairs: 6    GAIT: Gait pattern:  forward posture and short step length bilateral Distance walked: 30 ft Assistive device utilized: None Level of assistance: Complete Independence   FUNCTIONAL TESTS:  5 times sit to stand: 13.07 6 minute walk test: 1414 ft  Mini Best Test:   PATIENT SURVEYS:  Modified Oswestry 10/50  FOTO  70, risk adjusted goal of 75     TODAY'S TREATMENT: DATE: 12/29/22  Unless otherwise stated, CGA was provided and gait belt donned in order to ensure pt safety   NMR:   On airex: NBOS EC 4x30 sec - intermittent UE support, difficult  Slow march 2x20 alt LE - decreased eccentric control  NBOS EO vertical head turn 10x each way NBOS EO horizontal head turn  10x each way  KoreBalance- Follow moving target/ball 3x Tux racer 3x Comments: the above interventions were performed to challenge ankle and hip strategies/accuracy of weight-shifts and use of proprioceptive cues   Gait with large amplitude technique with and without dual cog task, 2# wrist weights donned. Pt completes multiple laps of 148 ft. Continued focus on sustaining large amplitude arm-swing, upright posture. Cuing provided throughout, VC/demo  Large amplitude STS with bilat shoulder abduction (seated on foam) with 2.5# wrist weights 1x10, .   Large amplitude step and unilateral FWD reach 2x10 each side  --Pt then repeats 1x10  each side on blue mat - mild decrease in balance    Large amplitude LTL step and reach with BUE 2x10 each side Comments: mild decrease in balance   PATIENT EDUCATION: Education details: Pt educated throughout session about proper posture and  technique with exercises. Improved exercise technique, movement at target joints, use of target muscles after min to mod verbal, visual, tactile cues.   Person educated: Patient Education method: Explanation, Demonstration, and Verbal cues Education comprehension: verbalized understanding, returned demonstration, and needs further education  HOME EXERCISE PROGRAM:Pt to continue HEP as listed below Seated PWR! Moves handout gone over and provided.  Standing PWR! Moves handout reviewed and provided.  All fours modified to chair UE support with PWR! moves handout reviewed and provided Access Code: [email or web address] URL: [email or web address] Date: 12/11/2022 Prepared by: Precious Bard Exercises - Straight Leg Raise with Arm Support  - 1 x daily - 7 x weekly - 2 sets - 10 reps - 5 hold - Single Leg  - 1 x daily - 7 x weekly - 2 sets - 2 reps - 30 hold    GOALS: Goals reviewed with patient? Yes  SHORT TERM GOALS: Target date: 10/24/2022    Patient will be independent in progressive home exercise program for PD to improve strength/mobility for better functional independence with ADLs. Baseline: No HEP for PD; 09/28/2022 per previous note pt currently perform PWR interventions as part of HEP; 10/31/22: Pt reports he feels comfortable with his HEP Goal status: MET   LONG TERM GOALS: Target date: 12/19/2022    1.  Patient  will complete five times sit to stand test in < 10 seconds indicating an increased LE strength and improved balance. Baseline: 13.07; 10/31/22 10.4 seconds hands-free; 12/11/22: 9.45 sec  Goal status:  MET   2.  Patient will increase FOTO score to equal to or greater than  75   to demonstrate statistically significant improvement in mobility and quality of life.  Baseline: 70; 10/31/2022: 71, 12/11/22: 82 Goal status: MET   3.  Patient will increase Mini best Balance score by > 4 points to demonstrate decreased fall risk during functional activities. Baseline: 19; 10/31/22:  23/28; 12/11/22: 25/28 Goal status: MET   4.   Patient will reduce dual task timed up and go to <11 seconds to reduce fall risk and demonstrate improved transfer/gait ability. Baseline: 12.97l 10/31/22: 8 sec Goal status: MET  5.   Patient will increase six minute walk test distance to >  1000 for progression to community ambulator and improve gait ability Baseline: Test visit 2 09/28/2022: 1414 ft; 10/31/2022: 1628 ft (previously met) Goal status: REVISED/MET 09/28/2022  6. Pt will improve modified oswestry score by 5 points or greater in order to indicate improved function in relation to his back pain   Baseline: 10/50; 11/01/22: 60%   Goal status: MET 7. Patient will be able to put his pants on without use of furniture to lean on.   Baseline: unable to hold R leg up > 80 deg hip flexion for longer than 5 seconds without UE support   Goal status: Initial     ASSESSMENT:  CLINICAL IMPRESSION:   Pt continued training gait/large amplitude technique. Main focus was on sustaining arm-swing and upright posture with addition of dual cog task. Pt noted during appointment that his friends have observed that his posture has improved, indicating functional carryover from sessions. Pt still limited by fatigue and decreased balance on compliant surfaces. Patient will continue to benefit from skilled therapy to address remaining deficits in order to improve quality of life and return to PLOF.    OBJECTIVE IMPAIRMENTS: Abnormal gait, decreased balance, decreased endurance, decreased mobility, difficulty walking, and hypomobility.   ACTIVITY LIMITATIONS: carrying, lifting, bending, standing, squatting, stairs, and locomotion level  PARTICIPATION LIMITATIONS: community activity  PERSONAL FACTORS: Age and 1-2 comorbidities: Asthma  are also affecting patient's functional outcome.   REHAB POTENTIAL: Good  CLINICAL DECISION MAKING: Stable/uncomplicated  EVALUATION COMPLEXITY: Low  PLAN:  PT FREQUENCY:  2x/week  PT DURATION: 12 weeks  PLANNED INTERVENTIONS: Therapeutic exercises, Therapeutic activity, Neuromuscular re-education, Balance training, Gait training, Patient/Family education, Self Care, Joint mobilization, Joint manipulation, Stair training, Dry Needling, Spinal mobilization, Cryotherapy, Moist heat, and Manual therapy  PLAN FOR NEXT SESSION:  balance, low back pain, and PD specific impairments, emphasis on large arm swing, work on L SL stance with R hip flexion > 80 deg ROM with less UE dependence, continue plan  Temple Pacini PT, DPT Physical Therapist - [facility]  Outpatient Physical Therapy- Main Campus [phone]     12/29/22, 8:43 AM

## 2023-01-02 ENCOUNTER — Ambulatory Visit: Payer: Medicare Other | Admitting: Physical Therapy

## 2023-01-02 DIAGNOSIS — R278 Other lack of coordination: Secondary | ICD-10-CM | POA: Diagnosis not present

## 2023-01-02 DIAGNOSIS — M5459 Other low back pain: Secondary | ICD-10-CM

## 2023-01-02 DIAGNOSIS — R262 Difficulty in walking, not elsewhere classified: Secondary | ICD-10-CM | POA: Diagnosis not present

## 2023-01-02 DIAGNOSIS — M25551 Pain in right hip: Secondary | ICD-10-CM | POA: Diagnosis not present

## 2023-01-02 DIAGNOSIS — G8929 Other chronic pain: Secondary | ICD-10-CM | POA: Diagnosis not present

## 2023-01-02 DIAGNOSIS — M546 Pain in thoracic spine: Secondary | ICD-10-CM

## 2023-01-02 DIAGNOSIS — M545 Low back pain, unspecified: Secondary | ICD-10-CM

## 2023-01-02 DIAGNOSIS — R293 Abnormal posture: Secondary | ICD-10-CM

## 2023-01-02 DIAGNOSIS — M6281 Muscle weakness (generalized): Secondary | ICD-10-CM

## 2023-01-02 DIAGNOSIS — R2689 Other abnormalities of gait and mobility: Secondary | ICD-10-CM | POA: Diagnosis not present

## 2023-01-02 DIAGNOSIS — R2681 Unsteadiness on feet: Secondary | ICD-10-CM

## 2023-01-02 NOTE — Therapy (Unsigned)
OUTPATIENT PHYSICAL THERAPY NEURO TREATMENT NOTE   Patient Name: Daniel [name] MRN: [id number] DOB:1950/08/07, 72 y.o., male Today's Date: 01/02/2023   PCP:    Denzil Magnuson   REFERRING PROVIDER: Lonell Face, MD   END OF SESSION:  PT End of Session - 01/02/23 1626     Visit Number 25    Number of Visits 46    Date for PT Re-Evaluation 03/15/23    Authorization Type [employer] MEDICARE reporting period from 06/19/2022    Progress Note Due on Visit 10    PT Start Time 1623    PT Stop Time 1700    PT Time Calculation (min) 37 min    Equipment Utilized During Treatment Gait belt    Activity Tolerance Patient tolerated treatment well    Behavior During Therapy [facility] for tasks assessed/performed                  Past Medical History:  Diagnosis Date   Actinic keratosis    Asthma    Eczema    Hx of dysplastic nevus 06/06/2006   Mid lower back, slight atypia   Hypothyroidism    Seasonal allergies    Thyroid disease    Past Surgical History:  Procedure Laterality Date   CATARACT EXTRACTION     COLON SURGERY     COLONOSCOPY WITH PROPOFOL N/A 11/18/2014   Procedure: COLONOSCOPY WITH PROPOFOL;  Surgeon: Scot Jun, MD;  Location: [facility] ENDOSCOPY;  Service: Endoscopy;  Laterality: N/A;   COLONOSCOPY WITH PROPOFOL N/A 12/13/2021   Procedure: COLONOSCOPY WITH PROPOFOL;  Surgeon: Regis Bill, MD;  Location: [facility] ENDOSCOPY;  Service: Endoscopy;  Laterality: N/A;   EYE SURGERY     KNEE SURGERY     NASAL SEPTUM SURGERY     Patient Active Problem List   Diagnosis Date Noted   Parkinson's disease 09/26/2022   Chronic idiopathic constipation 06/05/2022   Left lower quadrant abdominal pain 06/05/2022   Paroxysmal atrial fibrillation (HCC) 03/28/2022   Idiopathic hypotension 03/28/2022   Spondylolysis, lumbar region 03/28/2022   Avitaminosis D 03/28/2022   Myalgia due to statin 03/28/2022   Acquired thrombophilia (HCC) 03/28/2022   Hyperlipidemia  03/28/2022   Abnormal gait 03/28/2022   Mild intermittent asthma without complication 03/28/2022   Zenker's (hypopharyngeal) diverticulum 03/22/2018   Gastroesophageal reflux disease 03/22/2018   Fatty liver 10/09/2017   BPH (benign prostatic hyperplasia) 11/21/2014   Hypothyroidism 11/21/2014   Inguinal hernia 11/21/2014   OA (osteoarthritis) 11/21/2014   H/O adenomatous polyp of colon 10/14/2014    ONSET DATE: 04/28/2022 for PD, back pain began around 09/10/21   REFERRING DIAG:  G20.A1 (ICD-10-CM) - Parkinson's disease  M54.9 (ICD-10-CM) - Back pain    THERAPY DIAG:  Other abnormalities of gait and mobility  Other lack of coordination  Unsteadiness on feet  Abnormal posture  Muscle weakness (generalized)  Pain in right hip  Chronic bilateral low back pain, unspecified whether sciatica present  Other low back pain  Difficulty in walking, not elsewhere classified  Pain in thoracic spine  Rationale for Evaluation and Treatment: Rehabilitation  SUBJECTIVE:  SUBJECTIVE STATEMENT:   Pt reports some hip pain this morning, has eased off but it still hurts. No back pain currently. No stumbles/falls.     Pt accompanied by: self  PERTINENT HISTORY: Per [name] PT from 09/07/22 PT discharge from Low back treatment for focussed on PD related impairments at our clinic "Patient has attended 40 physical therapy treatment sessions since starting current episode of care on 12/20/2021. Patient at first struggled to make progress towards goals and has struggled at times with consistent attendance. However, over the past few weeks he has been improving with multi-discipline interventions including medications and increasing overall activity. It was also recently confirmed he has Parkinson's that is  likely contributing to some of his abnormal posture and difficulty with full ROM during movements. Patient is being discharged today due to improvement in condition and so he can start PT with focus on rehabilitation techniques for Parkinson's. Patient was provided with a long term HEP to help him with long term management of low back pain."  PAIN:  Are you having pain? Yes: NPRS scale: 5/10 Pain location: R knee  Pain description: sharp  Aggravating factors: when flexing the knee in weight bearing  Relieving factors: PT exercises, movement   PRECAUTIONS: None  WEIGHT BEARING RESTRICTIONS: No  FALLS: Has patient fallen in last 6 months? No  LIVING ENVIRONMENT: Lives with: lives alone Lives in: House/apartment Stairs: Yes: External: 5 steps; on left going up Has following equipment at home: None  PLOF: Independent  PATIENT GOALS: Improve his low back pain, improve posture, improve walking.   OBJECTIVE:   DIAGNOSTIC FINDINGS: MRI of L spine 07/12/21: Degenerative disc disease throughout the lumbar region with loss of disc height. No apparent compressive stenosis of the canal. Discogenic endplate marrow changes without active edema at L2-3, L3-4 and L4-5, which could correlate with regional axial back pain.   Left lateral recess and foraminal narrowing at L2-3, left foraminal narrowing at L3-4, and bilateral lateral recess and foraminal narrowing at L4-5. None of the stenoses are severe or are seen to result in definite neural compression.   Shallow left posterolateral disc herniation at L1-2 with caudal migration of a tiny amount of disc material to the left of midline. This does not appear to cause any significant compressive effect upon the neural structures.  COGNITION: Overall cognitive status: Within functional limits for tasks assessed   SENSATION: WFL   POSTURE: rounded shoulders, forward head, and increased thoracic kyphosis  LOWER EXTREMITY ROM:   WNL LOWER  EXTREMITY MMT:    MMT Right Eval Left Eval  Hip flexion 4+ 4+  Hip extension    Hip abduction 5 5  Hip adduction 5 5  Hip internal rotation    Hip external rotation    Knee flexion 5 5  Knee extension 5 5  Ankle dorsiflexion 5 5  Ankle plantarflexion 5 5  Ankle inversion    Ankle eversion    (Blank rows = not tested)  BED MOBILITY: WNL    STAIRS: Level of Assistance: Complete Independence Stair Negotiation Technique: Alternating Pattern  with Bilateral Rails Number of Stairs: 4  Height of Stairs: 6    GAIT: Gait pattern:  forward posture and short step length bilateral Distance walked: 30 ft Assistive device utilized: None Level of assistance: Complete Independence   FUNCTIONAL TESTS:  5 times sit to stand: 13.07 6 minute walk test: 1414 ft  Mini Best Test:   PATIENT SURVEYS:  Modified Oswestry 10/50  FOTO  70, risk adjusted goal of 75     TODAY'S TREATMENT: DATE: 01/02/23  Unless otherwise stated, CGA was provided and gait belt donned in order to ensure pt safety   Seated row Seated hip abduction  Seated hip abduction   NMR:   On airex: NBOS EC 4x30 sec - intermittent UE support, difficult  Slow march 2x20 alt LE - decreased eccentric control  NBOS EO vertical head turn 10x each way NBOS EO horizontal head turn  10x each way  KoreBalance- Follow moving target/ball 3x Tux racer 3x Comments: the above interventions were performed to challenge ankle and hip strategies/accuracy of weight-shifts and use of proprioceptive cues   Gait with large amplitude technique with and without dual cog task, 2# wrist weights donned. Pt completes multiple laps of 148 ft. Continued focus on sustaining large amplitude arm-swing, upright posture. Cuing provided throughout, VC/demo  Large amplitude STS with bilat shoulder abduction (seated on foam) with 2.5# wrist weights 1x10, .   Large amplitude step and unilateral FWD reach 2x10 each side  --Pt then repeats  1x10  each side on blue mat - mild decrease in balance    Large amplitude LTL step and reach with BUE 2x10 each side Comments: mild decrease in balance   PATIENT EDUCATION: Education details: Pt educated throughout session about proper posture and technique with exercises. Improved exercise technique, movement at target joints, use of target muscles after min to mod verbal, visual, tactile cues.   Person educated: Patient Education method: Explanation, Demonstration, and Verbal cues Education comprehension: verbalized understanding, returned demonstration, and needs further education  HOME EXERCISE PROGRAM:Pt to continue HEP as listed below Seated PWR! Moves handout gone over and provided.  Standing PWR! Moves handout reviewed and provided.  All fours modified to chair UE support with PWR! moves handout reviewed and provided Access Code: [email or web address] URL: [email or web address] Date: 12/11/2022 Prepared by: Precious Bard Exercises - Straight Leg Raise with Arm Support  - 1 x daily - 7 x weekly - 2 sets - 10 reps - 5 hold - Single Leg  - 1 x daily - 7 x weekly - 2 sets - 2 reps - 30 hold    GOALS: Goals reviewed with patient? Yes  SHORT TERM GOALS: Target date: 10/24/2022    Patient will be independent in progressive home exercise program for PD to improve strength/mobility for better functional independence with ADLs. Baseline: No HEP for PD; 09/28/2022 per previous note pt currently perform PWR interventions as part of HEP; 10/31/22: Pt reports he feels comfortable with his HEP Goal status: MET   LONG TERM GOALS: Target date: 12/19/2022    1.  Patient  will complete five times sit to stand test in < 10 seconds indicating an increased LE strength and improved balance. Baseline: 13.07; 10/31/22 10.4 seconds hands-free; 12/11/22: 9.45 sec  Goal status:  MET   2.  Patient will increase FOTO score to equal to or greater than  75   to demonstrate statistically significant improvement  in mobility and quality of life.  Baseline: 70; 10/31/2022: 71, 12/11/22: 82 Goal status: MET   3.  Patient will increase Mini best Balance score by > 4 points to demonstrate decreased fall risk during functional activities. Baseline: 19; 10/31/22: 23/28; 12/11/22: 25/28 Goal status: MET   4.   Patient will reduce dual task timed up and go to <11 seconds to reduce fall risk and demonstrate improved transfer/gait ability. Baseline: 12.97l 10/31/22: 8 sec Goal status: MET  5.  Patient will increase six minute walk test distance to >1000 for progression to community ambulator and improve gait ability Baseline: Test visit 2 09/28/2022: 1414 ft; 10/31/2022: 1628 ft (previously met) Goal status: REVISED/MET 09/28/2022  6. Pt will improve modified oswestry score by 5 points or greater in order to indicate improved function in relation to his back pain   Baseline: 10/50; 11/01/22: 60%   Goal status: MET 7. Patient will be able to put his pants on without use of furniture to lean on.   Baseline: unable to hold R leg up > 80 deg hip flexion for longer than 5 seconds without UE support   Goal status: Initial     ASSESSMENT:  CLINICAL IMPRESSION:   Pt continued training gait/large amplitude technique. Main focus was on sustaining arm-swing and upright posture with addition of dual cog task. Pt noted during appointment that his friends have observed that his posture has improved, indicating functional carryover from sessions. Pt still limited by fatigue and decreased balance on compliant surfaces. Patient will continue to benefit from skilled therapy to address remaining deficits in order to improve quality of life and return to PLOF.    OBJECTIVE IMPAIRMENTS: Abnormal gait, decreased balance, decreased endurance, decreased mobility, difficulty walking, and hypomobility.   ACTIVITY LIMITATIONS: carrying, lifting, bending, standing, squatting, stairs, and locomotion level  PARTICIPATION LIMITATIONS: community  activity  PERSONAL FACTORS: Age and 1-2 comorbidities: Asthma  are also affecting patient's functional outcome.   REHAB POTENTIAL: Good  CLINICAL DECISION MAKING: Stable/uncomplicated  EVALUATION COMPLEXITY: Low  PLAN:  PT FREQUENCY: 2x/week  PT DURATION: 12 weeks  PLANNED INTERVENTIONS: Therapeutic exercises, Therapeutic activity, Neuromuscular re-education, Balance training, Gait training, Patient/Family education, Self Care, Joint mobilization, Joint manipulation, Stair training, Dry Needling, Spinal mobilization, Cryotherapy, Moist heat, and Manual therapy  PLAN FOR NEXT SESSION:   balance, low back pain, and PD specific impairments, emphasis on large arm swing, work on L SL stance with R hip flexion > 80 deg ROM with less UE dependence, continue plan  Temple Pacini PT, DPT Physical Therapist - [facility]  Outpatient Physical Therapy- Main Campus [phone]     01/02/23, 4:28 PM

## 2023-01-03 ENCOUNTER — Ambulatory Visit: Payer: Self-pay | Admitting: *Deleted

## 2023-01-03 ENCOUNTER — Encounter: Payer: Self-pay | Admitting: Physician Assistant

## 2023-01-03 ENCOUNTER — Ambulatory Visit (INDEPENDENT_AMBULATORY_CARE_PROVIDER_SITE_OTHER): Payer: Medicare Other | Admitting: Physician Assistant

## 2023-01-03 VITALS — BP 102/73 | HR 67 | Temp 97.4°F | Ht 74.0 in | Wt 211.2 lb

## 2023-01-03 DIAGNOSIS — U071 COVID-19: Secondary | ICD-10-CM | POA: Diagnosis not present

## 2023-01-03 DIAGNOSIS — R059 Cough, unspecified: Secondary | ICD-10-CM | POA: Diagnosis not present

## 2023-01-03 HISTORY — DX: COVID-19: U07.1

## 2023-01-03 LAB — POC COVID19 BINAXNOW: SARS Coronavirus 2 Ag: POSITIVE — AB

## 2023-01-03 MED ORDER — NIRMATRELVIR/RITONAVIR (PAXLOVID)TABLET
3.0000 | ORAL_TABLET | Freq: Two times a day (BID) | ORAL | 0 refills | Status: AC
Start: 2023-01-03 — End: 2023-01-08

## 2023-01-03 NOTE — Telephone Encounter (Signed)
Summary: fever of 100/nauseous/weak   Patient called in stated he had symptoms of running a fever of 99 then it goes to 100, also feeling nauseous, weak & sweating some.  I went ahead & scheduled an appt for today before it got gone, patient was okay with seeing another provider.   Patient's callback # [phone]     Reason for Disposition  [1] Fever > 101 F (38.3 C) AND [2] age > 60 years  Answer Assessment - Initial Assessment Questions 1. TEMPERATURE: "What is the most recent temperature?"  "How was it measured?"      99-100, 101 now-oral 2. ONSET: "When did the fever start?"      This morning 3. CHILLS: "Do you have chills?" If yes: "How bad are they?"  (e.g., none, mild, moderate, severe)   - NONE: no chills   - MILD: feeling cold   - MODERATE: feeling very cold, some shivering (feels better under a thick blanket)   - SEVERE: feeling extremely cold with shaking chills (general body shaking, rigors; even under a thick blanket)      no 4. OTHER SYMPTOMS: "Do you have any other symptoms besides the fever?"  (e.g., abdomen pain, cough, diarrhea, earache, headache, sore throat, urination pain)     Cough, nausea, weak 5. CAUSE: If there are no symptoms, ask: "What do you think is causing the fever?"      unsure 6. CONTACTS: "Does anyone else in the family have an infection?"     no 7. TREATMENT: "What have you done so far to treat this fever?" (e.g., medications)     Mucinex 8am 8. IMMUNOCOMPROMISE: "Do you have of the following: diabetes, HIV positive, splenectomy, cancer chemotherapy, chronic steroid treatment, transplant patient, etc."     Liver problems  Protocols used: Fever-A-AH

## 2023-01-03 NOTE — Progress Notes (Signed)
Established patient visit  Patient: Daniel [name]   DOB: Feb 18, 1951   72 y.o. Male  MRN: [id number] Visit Date: 01/03/2023  Today's healthcare provider: Debera Lat, PA-C   Chief Complaint  Patient presents with   Acute Visit maybe covid   Subjective    HPI Symptoms started yesterday, patient thinks it maybe Covid. Fever was 105 then dropped to 99.5. Prior treatment mucinex           09/26/2022   10:44 AM 03/28/2022   10:25 AM 12/05/2021    8:20 AM  Depression screen PHQ 2/9  Decreased Interest 0 0 0  Down, Depressed, Hopeless 0 0 0  PHQ - 2 Score 0 0 0  Altered sleeping 0 0 0  Tired, decreased energy 0 0 0  Change in appetite 0 0 0  Feeling bad or failure about yourself  0 0 0  Trouble concentrating 0 0 0  Moving slowly or fidgety/restless 0 0 0  Suicidal thoughts 0 0 0  PHQ-9 Score 0 0 0  Difficult doing work/chores Not difficult at all Not difficult at all Not difficult at all       No data to display          Medications: Outpatient Medications Prior to Visit  Medication Sig   carbidopa-levodopa (PARCOPA) 25-100 MG disintegrating tablet Take 1 tablet by mouth 3 (three) times daily.   Cholecalciferol (VITAMIN D) 125 MCG (5000 UT) CAPS Take 5,000 Units by mouth daily.   docusate sodium (COLACE) 100 MG capsule Take 200 mg by mouth at bedtime.   ELIQUIS 5 MG TABS tablet Take 5 mg by mouth 2 (two) times daily.   levothyroxine (SYNTHROID) 100 MCG tablet TAKE 1 TABLET BY MOUTH EVERY DAY BEFORE BREAKFAST   metoprolol succinate (TOPROL-XL) 25 MG 24 hr tablet Take 25 mg by mouth daily.   pantoprazole (PROTONIX) 40 MG tablet Take 1 tablet (40 mg total) by mouth daily.   polyethylene glycol (MIRALAX / GLYCOLAX) 17 g packet Take 17 g by mouth daily as needed for moderate constipation or severe constipation.   predniSONE (DELTASONE) 20 MG tablet Take 1 tablet (20 mg total) by mouth daily with breakfast.   traMADol (ULTRAM) 50 MG tablet Take 75 mg by mouth 2 (two)  times daily.   [DISCONTINUED] albuterol (VENTOLIN HFA) 108 (90 Base) MCG/ACT inhaler Inhale 2 puffs into the lungs every 6 (six) hours as needed for wheezing or shortness of breath. (Patient not taking: Reported on 01/03/2023)   [DISCONTINUED] ELDERBERRY PO Take 200 mg by mouth at bedtime. (Patient not taking: Reported on 01/03/2023)   No facility-administered medications prior to visit.    Review of Systems  All other systems reviewed and are negative.  Except see HPI       Objective    BP 102/73 (BP Location: Left Arm, Patient Position: Sitting, Cuff Size: Normal)   Pulse 67   Temp (!) 97.4 F (36.3 C) (Oral)   Ht 6\' 2"  (1.88 m)   Wt 211 lb 3.2 oz (95.8 kg)   SpO2 98%   BMI 27.12 kg/m     Physical Exam Constitutional:      General: He is not in acute distress.    Appearance: Normal appearance. He is not diaphoretic.  HENT:     Head: Normocephalic.  Eyes:     Conjunctiva/sclera: Conjunctivae normal.  Pulmonary:     Effort: Pulmonary effort is normal. No respiratory distress.  Neurological:     Mental Status:  He is alert and oriented to person, place, and time. Mental status is at baseline.      Results for orders placed or performed in visit on 01/03/23  POC COVID-19  Result Value Ref Range   SARS Coronavirus 2 Ag Positive (A) Negative    Assessment & Plan    1. Cough, unspecified type 2. COVID-19 X 1 day  - new problem - POC COVID-19 positive Pt was advised symptomatic treatment and called in antiviral paxlovid Counseled risk/benefits of medications. Expected course of illness. Potential rebound symptoms and potential complications and to call if sx worsen or not much better when finished with paxlovid - discussed return precautions  - discussed masking/isolation   Advised isolation and testing of all people closed to him.  - nirmatrelvir/ritonavir (PAXLOVID) 20 x 150 MG & 10 x 100MG  TABS; Take 3 tablets by mouth 2 (two) times daily for 5 days. (Take  nirmatrelvir 150 mg two tablets twice daily for 5 days and ritonavir 100 mg one tablet twice daily for 5 days) Patient GFR is 79  Dispense: 30 tablet; Refill: 0     No follow-ups on file.     The patient was advised to call back or seek an in-person evaluation if the symptoms worsen or if the condition fails to improve as anticipated.  I discussed the assessment and treatment plan with the patient. The patient was provided an opportunity to ask questions and all were answered. The patient agreed with the plan and demonstrated an understanding of the instructions.  I, Debera Lat, PA-C have reviewed all documentation for this visit. The documentation on  01/03/23 for the exam, diagnosis, procedures, and orders are all accurate and complete.  Debera Lat, [facility], MMS [facility] [phone] (phone) [phone] (fax)  [facility]

## 2023-01-03 NOTE — Telephone Encounter (Signed)
  Chief Complaint: fever,cough, nausea, body aches Symptoms: see above Frequency: fever started today Pertinent Negatives: Patient denies exposure to illness Disposition: [] ED /[] Urgent Care (no appt availability in office) / [x] Appointment(In office/virtual)/ []  [place] [facility]/ [] Home Care/ [] Refused Recommended Disposition /[]  Mobile Bus/ []  Follow-up with PCP Additional Notes: Patient advised of self care advised until he can be seen in office- call back with worsening symptoms

## 2023-01-04 ENCOUNTER — Ambulatory Visit: Payer: Medicare Other

## 2023-01-09 ENCOUNTER — Other Ambulatory Visit: Payer: Self-pay | Admitting: Family Medicine

## 2023-01-10 ENCOUNTER — Ambulatory Visit: Payer: Medicare Other

## 2023-01-15 ENCOUNTER — Ambulatory Visit: Payer: Medicare Other

## 2023-01-15 DIAGNOSIS — M545 Low back pain, unspecified: Secondary | ICD-10-CM

## 2023-01-15 DIAGNOSIS — R2689 Other abnormalities of gait and mobility: Secondary | ICD-10-CM

## 2023-01-15 DIAGNOSIS — R278 Other lack of coordination: Secondary | ICD-10-CM | POA: Diagnosis not present

## 2023-01-15 DIAGNOSIS — R262 Difficulty in walking, not elsewhere classified: Secondary | ICD-10-CM

## 2023-01-15 DIAGNOSIS — M5459 Other low back pain: Secondary | ICD-10-CM | POA: Diagnosis not present

## 2023-01-15 DIAGNOSIS — M6281 Muscle weakness (generalized): Secondary | ICD-10-CM

## 2023-01-15 DIAGNOSIS — R2681 Unsteadiness on feet: Secondary | ICD-10-CM

## 2023-01-15 DIAGNOSIS — R293 Abnormal posture: Secondary | ICD-10-CM | POA: Diagnosis not present

## 2023-01-15 DIAGNOSIS — M546 Pain in thoracic spine: Secondary | ICD-10-CM | POA: Diagnosis not present

## 2023-01-15 DIAGNOSIS — G8929 Other chronic pain: Secondary | ICD-10-CM | POA: Diagnosis not present

## 2023-01-15 DIAGNOSIS — M25551 Pain in right hip: Secondary | ICD-10-CM | POA: Diagnosis not present

## 2023-01-15 NOTE — Therapy (Signed)
OUTPATIENT PHYSICAL THERAPY NEURO TREATMENT NOTE   Patient Name: Daniel [name] MRN: [id number] DOB:10/31/1950, 72 y.o., male Today's Date: 01/15/2023   PCP:    Denzil Magnuson   REFERRING PROVIDER: Lonell Face, MD   END OF SESSION:  PT End of Session - 01/15/23 1013     Visit Number 23    Number of Visits 46    Date for PT Re-Evaluation 03/15/23    Authorization Type [employer] MEDICARE reporting period from 06/19/2022    Progress Note Due on Visit 10    PT Start Time 1015    PT Stop Time 1056    PT Time Calculation (min) 41 min    Equipment Utilized During Treatment Gait belt    Activity Tolerance Patient tolerated treatment well    Behavior During Therapy WFL for tasks assessed/performed              Past Medical History:  Diagnosis Date   Actinic keratosis    Asthma    Eczema    Hx of dysplastic nevus 06/06/2006   Mid lower back, slight atypia   Hypothyroidism    Seasonal allergies    Thyroid disease    Past Surgical History:  Procedure Laterality Date   CATARACT EXTRACTION     COLON SURGERY     COLONOSCOPY WITH PROPOFOL N/A 11/18/2014   Procedure: COLONOSCOPY WITH PROPOFOL;  Surgeon: Scot Jun, MD;  Location: [facility] ENDOSCOPY;  Service: Endoscopy;  Laterality: N/A;   COLONOSCOPY WITH PROPOFOL N/A 12/13/2021   Procedure: COLONOSCOPY WITH PROPOFOL;  Surgeon: Regis Bill, MD;  Location: [facility] ENDOSCOPY;  Service: Endoscopy;  Laterality: N/A;   EYE SURGERY     KNEE SURGERY     NASAL SEPTUM SURGERY     Patient Active Problem List   Diagnosis Date Noted   COVID-19 01/03/2023   Parkinson's disease 09/26/2022   Chronic idiopathic constipation 06/05/2022   Left lower quadrant abdominal pain 06/05/2022   Paroxysmal atrial fibrillation (HCC) 03/28/2022   Idiopathic hypotension 03/28/2022   Spondylolysis, lumbar region 03/28/2022   Avitaminosis D 03/28/2022   Myalgia due to statin 03/28/2022   Acquired thrombophilia (HCC) 03/28/2022    Hyperlipidemia 03/28/2022   Abnormal gait 03/28/2022   Mild intermittent asthma without complication 03/28/2022   Zenker's (hypopharyngeal) diverticulum 03/22/2018   Gastroesophageal reflux disease 03/22/2018   Fatty liver 10/09/2017   BPH (benign prostatic hyperplasia) 11/21/2014   Hypothyroidism 11/21/2014   Inguinal hernia 11/21/2014   OA (osteoarthritis) 11/21/2014   H/O adenomatous polyp of colon 10/14/2014    ONSET DATE: 04/28/2022 for PD, back pain began around 09/10/21   REFERRING DIAG:  G20.A1 (ICD-10-CM) - Parkinson's disease  M54.9 (ICD-10-CM) - Back pain    THERAPY DIAG:  Other abnormalities of gait and mobility  Other lack of coordination  Unsteadiness on feet  Abnormal posture  Muscle weakness (generalized)  Pain in right hip  Chronic bilateral low back pain, unspecified whether sciatica present  Other low back pain  Difficulty in walking, not elsewhere classified  Pain in thoracic spine  Rationale for Evaluation and Treatment: Rehabilitation  SUBJECTIVE:  SUBJECTIVE STATEMENT:   Pt reports he had some increased pain this morning, but it has since resolved.  Pt notes that he is still weak from Covid.      Pt accompanied by: self  PERTINENT HISTORY: Per [name] PT from 09/07/22 PT discharge from Low back treatment for focussed on PD related impairments at our clinic "Patient has attended 40 physical therapy treatment sessions since starting current episode of care on 12/20/2021. Patient at first struggled to make progress towards goals and has struggled at times with consistent attendance. However, over the past few weeks he has been improving with multi-discipline interventions including medications and increasing overall activity. It was also recently confirmed he  has Parkinson's that is likely contributing to some of his abnormal posture and difficulty with full ROM during movements. Patient is being discharged today due to improvement in condition and so he can start PT with focus on rehabilitation techniques for Parkinson's. Patient was provided with a long term HEP to help him with long term management of low back pain."  PAIN:  Are you having pain? Yes: NPRS scale: 3/10 Pain location: R knee  Pain description: sharp  Aggravating factors: when flexing the knee in weight bearing  Relieving factors: PT exercises, movement   PRECAUTIONS: None  WEIGHT BEARING RESTRICTIONS: No  FALLS: Has patient fallen in last 6 months? No  LIVING ENVIRONMENT: Lives with: lives alone Lives in: House/apartment Stairs: Yes: External: 5 steps; on left going up Has following equipment at home: None  PLOF: Independent  PATIENT GOALS: Improve his low back pain, improve posture, improve walking.   OBJECTIVE:   DIAGNOSTIC FINDINGS: MRI of L spine 07/12/21: Degenerative disc disease throughout the lumbar region with loss of disc height. No apparent compressive stenosis of the canal. Discogenic endplate marrow changes without active edema at L2-3, L3-4 and L4-5, which could correlate with regional axial back pain.   Left lateral recess and foraminal narrowing at L2-3, left foraminal narrowing at L3-4, and bilateral lateral recess and foraminal narrowing at L4-5. None of the stenoses are severe or are seen to result in definite neural compression.   Shallow left posterolateral disc herniation at L1-2 with caudal migration of a tiny amount of disc material to the left of midline. This does not appear to cause any significant compressive effect upon the neural structures.  COGNITION: Overall cognitive status: Within functional limits for tasks assessed   SENSATION: WFL   POSTURE: rounded shoulders, forward head, and increased thoracic kyphosis  LOWER  EXTREMITY ROM:   WNL LOWER EXTREMITY MMT:    MMT Right Eval Left Eval  Hip flexion 4+ 4+  Hip extension    Hip abduction 5 5  Hip adduction 5 5  Hip internal rotation    Hip external rotation    Knee flexion 5 5  Knee extension 5 5  Ankle dorsiflexion 5 5  Ankle plantarflexion 5 5  Ankle inversion    Ankle eversion    (Blank rows = not tested)  BED MOBILITY: WNL    STAIRS: Level of Assistance: Complete Independence Stair Negotiation Technique: Alternating Pattern  with Bilateral Rails Number of Stairs: 4  Height of Stairs: 6    GAIT: Gait pattern:  forward posture and short step length bilateral Distance walked: 30 ft Assistive device utilized: None Level of assistance: Complete Independence   FUNCTIONAL TESTS:  5 times sit to stand: 13.07 6 minute walk test: 1414 ft  Mini Best Test:   PATIENT SURVEYS:  Modified Oswestry 10/50  FOTO 70, risk adjusted goal of 75     TODAY'S TREATMENT: DATE: 01/15/23  Unless otherwise stated, CGA was provided and gait belt donned in order to ensure pt safety    NMR:   KoreBalance- Follow moving target/ball x3 with greatest number being 10 total targets reached in 30 seconds.  Tux racer x3 practice rounds on fast course Comments: the above interventions were performed to challenge ankle and hip strategies/accuracy of weight-shifts and use of proprioceptive cues   Gait with large amplitude technique with 2# wrist weights donned. Pt completes 5 laps of 148 ft. Continued focus on sustaining large amplitude arm-swing, upright posture. Cuing provided throughout, VC/demo  Large amplitude STS with bilat shoulder abduction (seated on foam) with 2.5# wrist weights 1x10.  Large amplitude step and unilateral FWD reach 2x10 each side  Pt then repeats 1x10  each side on blue mat - mild decrease in balance    Large amplitude LTL step and reach with BUE 2x10 each side Comments: mild decrease in balance   PATIENT  EDUCATION: Education details: Pt educated throughout session about proper posture and technique with exercises. Improved exercise technique, movement at target joints, use of target muscles after min to mod verbal, visual, tactile cues.   Person educated: Patient Education method: Explanation, Demonstration, and Verbal cues Education comprehension: verbalized understanding, returned demonstration, and needs further education  HOME EXERCISE PROGRAM:Pt to continue HEP as listed below Seated PWR! Moves handout gone over and provided.  Standing PWR! Moves handout reviewed and provided.  All fours modified to chair UE support with PWR! moves handout reviewed and provided Access Code: [email or web address] URL: [email or web address] Date: 12/11/2022 Prepared by: Precious Bard Exercises - Straight Leg Raise with Arm Support  - 1 x daily - 7 x weekly - 2 sets - 10 reps - 5 hold - Single Leg  - 1 x daily - 7 x weekly - 2 sets - 2 reps - 30 hold    GOALS: Goals reviewed with patient? Yes  SHORT TERM GOALS: Target date: 10/24/2022    Patient will be independent in progressive home exercise program for PD to improve strength/mobility for better functional independence with ADLs. Baseline: No HEP for PD; 09/28/2022 per previous note pt currently perform PWR interventions as part of HEP; 10/31/22: Pt reports he feels comfortable with his HEP Goal status: MET   LONG TERM GOALS: Target date: 12/19/2022   1.  Patient  will complete five times sit to stand test in < 10 seconds indicating an increased LE strength and improved balance. Baseline: 13.07; 10/31/22 10.4 seconds hands-free; 12/11/22: 9.45 sec  Goal status:  MET   2.  Patient will increase FOTO score to equal to or greater than  75   to demonstrate statistically significant improvement in mobility and quality of life.  Baseline: 70; 10/31/2022: 71, 12/11/22: 82 Goal status: MET   3.  Patient will increase Mini best Balance score by > 4 points to  demonstrate decreased fall risk during functional activities. Baseline: 19; 10/31/22: 23/28; 12/11/22: 25/28 Goal status: MET   4.  Patient will reduce dual task timed up and go to <11 seconds to reduce fall risk and demonstrate improved transfer/gait ability. Baseline: 12.97l 10/31/22: 8 sec Goal status: MET  5.  Patient will increase six minute walk test distance to >1000 for progression to community ambulator and improve gait ability Baseline: Test visit 2 09/28/2022: 1414 ft; 10/31/2022: 1628 ft (previously met) Goal status: REVISED/MET 09/28/2022  6. Pt will improve modified oswestry score by 5 points or greater in order to indicate improved function in relation to his back pain   Baseline: 10/50; 11/01/22: 60%   Goal status: MET 7. Patient will be able to put his pants on without use of furniture to lean on.   Baseline: unable to hold R leg up > 80 deg hip flexion for longer than 5 seconds without UE support   Goal status: Initial     ASSESSMENT:  CLINICAL IMPRESSION:    Pt continues to require rest breaks throughout the therapy session due to fatigue, however is doing well otherwise.  Pt was encouraged to perform STS's at home in order to improve overall endurance levels to respond favorably from Covid.  Pt agreeable and is wanting to address endurance levels as well.   Pt will continue to benefit from skilled therapy to address remaining deficits in order to improve overall QoL and return to PLOF.     OBJECTIVE IMPAIRMENTS: Abnormal gait, decreased balance, decreased endurance, decreased mobility, difficulty walking, and hypomobility.   ACTIVITY LIMITATIONS: carrying, lifting, bending, standing, squatting, stairs, and locomotion level  PARTICIPATION LIMITATIONS: community activity  PERSONAL FACTORS: Age and 1-2 comorbidities: Asthma  are also affecting patient's functional outcome.   REHAB POTENTIAL: Good  CLINICAL DECISION MAKING: Stable/uncomplicated  EVALUATION COMPLEXITY:  Low  PLAN:  PT FREQUENCY: 2x/week  PT DURATION: 12 weeks  PLANNED INTERVENTIONS: Therapeutic exercises, Therapeutic activity, Neuromuscular re-education, Balance training, Gait training, Patient/Family education, Self Care, Joint mobilization, Joint manipulation, Stair training, Dry Needling, Spinal mobilization, Cryotherapy, Moist heat, and Manual therapy  PLAN FOR NEXT SESSION:   balance, low back pain, and PD specific impairments, emphasis on large arm swing, work on L SL stance with R hip flexion > 80 deg ROM with less UE dependence, continue plan    Nolon Bussing, PT, DPT Physical Therapist - [facility]  [facility]  01/15/23, 5:39 PM

## 2023-01-16 ENCOUNTER — Encounter: Payer: Self-pay | Admitting: Podiatry

## 2023-01-16 ENCOUNTER — Ambulatory Visit: Payer: Medicare Other | Admitting: Podiatry

## 2023-01-16 DIAGNOSIS — L600 Ingrowing nail: Secondary | ICD-10-CM | POA: Diagnosis not present

## 2023-01-16 DIAGNOSIS — R52 Pain, unspecified: Secondary | ICD-10-CM | POA: Diagnosis not present

## 2023-01-16 NOTE — Progress Notes (Signed)
Chief Complaint  Patient presents with   Nail Problem    "The right toe is infected, the ingrown toenail." N - painful toenail L - hallux right D - 2 weeks O - suddenly, about the same C - tender A - pressure T - bandaid, Neosporin    Subjective: Patient PMHx A-fib on Eliquis presents today for a new complaint of pain to the medial border of the right great toe.  Onset about 2 weeks ago.  He says that over the last 2 years he has had tenderness to the medial border of the right hallux nail plate and he gets routine pedicures to try and take out the portion of the ingrown toenail.  Patient is concerned for possible exacerbation of his ingrown nail.  It is very sensitive to touch.  Patient presents today for further treatment and evaluation.  Past Medical History:  Diagnosis Date   Actinic keratosis    Asthma    Eczema    Hx of dysplastic nevus 06/06/2006   Mid lower back, slight atypia   Hypothyroidism    Seasonal allergies    Thyroid disease     Past Surgical History:  Procedure Laterality Date   CATARACT EXTRACTION     COLON SURGERY     COLONOSCOPY WITH PROPOFOL N/A 11/18/2014   Procedure: COLONOSCOPY WITH PROPOFOL;  Surgeon: Scot Jun, MD;  Location: [facility] ENDOSCOPY;  Service: Endoscopy;  Laterality: N/A;   COLONOSCOPY WITH PROPOFOL N/A 12/13/2021   Procedure: COLONOSCOPY WITH PROPOFOL;  Surgeon: Regis Bill, MD;  Location: [facility] ENDOSCOPY;  Service: Endoscopy;  Laterality: N/A;   EYE SURGERY     KNEE SURGERY     NASAL SEPTUM SURGERY      Allergies  Allergen Reactions   Ciprofloxacin Hcl Other (See Comments)    Blisters break out   Levaquin [Levofloxacin In D5w]    Sulfa Antibiotics Other (See Comments)    unknown    Objective:  General: Well developed, nourished, in no acute distress, alert and oriented x3   Dermatology: Skin is warm, dry and supple bilateral.  Medial border right great toe is tender with evidence of an ingrowing nail. Pain on  palpation noted to the border of the nail fold. The remaining nails appear unremarkable at this time.   Vascular: DP and PT pulses palpable.  No clinical evidence of vascular compromise  Neruologic: Grossly intact via light touch bilateral.  Musculoskeletal: No pedal deformity noted  Assesement: #1 Paronychia with ingrowing nail medial border right great toe  Plan of Care:  -Patient evaluated.  -Discussed treatment alternatives and plan of care. Explained nail avulsion procedure and post procedure course to patient. -Patient opted for permanent partial nail avulsion of the ingrown portion of the nail.  -Prior to procedure, local anesthesia infiltration utilized using 3 ml of a 50:50 mixture of 2% plain lidocaine and 0.5% plain marcaine in a normal hallux block fashion and a betadine prep performed.  -Partial permanent nail avulsion with chemical matrixectomy performed using 3x30sec applications of phenol followed by alcohol flush.  - Reinforced dressing applied.  Post care instructions provided -Return to clinic 3 weeks  Felecia Shelling, DPM [facility]  Dr. Felecia Shelling, DPM    [address]  [place], [place] [address]                Office [phone]  Fax [phone]

## 2023-01-17 ENCOUNTER — Ambulatory Visit: Payer: Medicare Other

## 2023-01-22 ENCOUNTER — Ambulatory Visit: Payer: Medicare Other

## 2023-01-22 DIAGNOSIS — M6281 Muscle weakness (generalized): Secondary | ICD-10-CM | POA: Diagnosis not present

## 2023-01-22 DIAGNOSIS — R262 Difficulty in walking, not elsewhere classified: Secondary | ICD-10-CM | POA: Diagnosis not present

## 2023-01-22 DIAGNOSIS — G8929 Other chronic pain: Secondary | ICD-10-CM | POA: Diagnosis not present

## 2023-01-22 DIAGNOSIS — R278 Other lack of coordination: Secondary | ICD-10-CM

## 2023-01-22 DIAGNOSIS — M546 Pain in thoracic spine: Secondary | ICD-10-CM | POA: Diagnosis not present

## 2023-01-22 DIAGNOSIS — R2689 Other abnormalities of gait and mobility: Secondary | ICD-10-CM | POA: Diagnosis not present

## 2023-01-22 DIAGNOSIS — M25551 Pain in right hip: Secondary | ICD-10-CM | POA: Diagnosis not present

## 2023-01-22 DIAGNOSIS — M545 Low back pain, unspecified: Secondary | ICD-10-CM | POA: Diagnosis not present

## 2023-01-22 DIAGNOSIS — R293 Abnormal posture: Secondary | ICD-10-CM | POA: Diagnosis not present

## 2023-01-22 DIAGNOSIS — M5459 Other low back pain: Secondary | ICD-10-CM | POA: Diagnosis not present

## 2023-01-22 DIAGNOSIS — R2681 Unsteadiness on feet: Secondary | ICD-10-CM | POA: Diagnosis not present

## 2023-01-22 NOTE — Therapy (Signed)
OUTPATIENT PHYSICAL THERAPY NEURO TREATMENT NOTE   Patient Name: Daniel [name] MRN: [id number] DOB:09-23-1950, 72 y.o., male Today's Date: 01/22/2023   PCP:    Denzil Magnuson   REFERRING PROVIDER: Lonell Face, MD   END OF SESSION:  PT End of Session - 01/22/23 1658     Visit Number 24    Number of Visits 46    Date for PT Re-Evaluation 03/15/23    Authorization Type [employer] MEDICARE reporting period from 06/19/2022    Progress Note Due on Visit 10    PT Start Time 1617    PT Stop Time 1653    PT Time Calculation (min) 36 min    Equipment Utilized During Treatment Gait belt    Activity Tolerance Patient tolerated treatment well;Patient limited by fatigue    Behavior During Therapy [facility] for tasks assessed/performed               Past Medical History:  Diagnosis Date   Actinic keratosis    Asthma    Eczema    Hx of dysplastic nevus 06/06/2006   Mid lower back, slight atypia   Hypothyroidism    Seasonal allergies    Thyroid disease    Past Surgical History:  Procedure Laterality Date   CATARACT EXTRACTION     COLON SURGERY     COLONOSCOPY WITH PROPOFOL N/A 11/18/2014   Procedure: COLONOSCOPY WITH PROPOFOL;  Surgeon: Scot Jun, MD;  Location: [facility] ENDOSCOPY;  Service: Endoscopy;  Laterality: N/A;   COLONOSCOPY WITH PROPOFOL N/A 12/13/2021   Procedure: COLONOSCOPY WITH PROPOFOL;  Surgeon: Regis Bill, MD;  Location: [facility] ENDOSCOPY;  Service: Endoscopy;  Laterality: N/A;   EYE SURGERY     KNEE SURGERY     NASAL SEPTUM SURGERY     Patient Active Problem List   Diagnosis Date Noted   COVID-19 01/03/2023   Parkinson's disease 09/26/2022   Chronic idiopathic constipation 06/05/2022   Left lower quadrant abdominal pain 06/05/2022   Paroxysmal atrial fibrillation (HCC) 03/28/2022   Idiopathic hypotension 03/28/2022   Spondylolysis, lumbar region 03/28/2022   Avitaminosis D 03/28/2022   Myalgia due to statin 03/28/2022   Acquired  thrombophilia (HCC) 03/28/2022   Hyperlipidemia 03/28/2022   Abnormal gait 03/28/2022   Mild intermittent asthma without complication 03/28/2022   Zenker's (hypopharyngeal) diverticulum 03/22/2018   Gastroesophageal reflux disease 03/22/2018   Fatty liver 10/09/2017   BPH (benign prostatic hyperplasia) 11/21/2014   Hypothyroidism 11/21/2014   Inguinal hernia 11/21/2014   OA (osteoarthritis) 11/21/2014   H/O adenomatous polyp of colon 10/14/2014    ONSET DATE: 04/28/2022 for PD, back pain began around 09/10/21   REFERRING DIAG:  G20.A1 (ICD-10-CM) - Parkinson's disease  M54.9 (ICD-10-CM) - Back pain    THERAPY DIAG:  Muscle weakness (generalized)  Other lack of coordination  Difficulty in walking, not elsewhere classified  Rationale for Evaluation and Treatment: Rehabilitation  SUBJECTIVE:  SUBJECTIVE STATEMENT:   Pt still feels decreased endurance since COVID. Pt reports remaining symptom is weakness.   Pt accompanied by: self  PERTINENT HISTORY: Per [name] PT from 09/07/22 PT discharge from Low back treatment for focussed on PD related impairments at our clinic "Patient has attended 40 physical therapy treatment sessions since starting current episode of care on 12/20/2021. Patient at first struggled to make progress towards goals and has struggled at times with consistent attendance. However, over the past few weeks he has been improving with multi-discipline interventions including medications and increasing overall activity. It was also recently confirmed he has Parkinson's that is likely contributing to some of his abnormal posture and difficulty with full ROM during movements. Patient is being discharged today due to improvement in condition and so he can start PT with focus on  rehabilitation techniques for Parkinson's. Patient was provided with a long term HEP to help him with long term management of low back pain."  PAIN:  Are you having pain? Yes: NPRS scale: 3/10 Pain location: R knee  Pain description: sharp  Aggravating factors: when flexing the knee in weight bearing  Relieving factors: PT exercises, movement   PRECAUTIONS: None  WEIGHT BEARING RESTRICTIONS: No  FALLS: Has patient fallen in last 6 months? No  LIVING ENVIRONMENT: Lives with: lives alone Lives in: House/apartment Stairs: Yes: External: 5 steps; on left going up Has following equipment at home: None  PLOF: Independent  PATIENT GOALS: Improve his low back pain, improve posture, improve walking.   OBJECTIVE:   DIAGNOSTIC FINDINGS: MRI of L spine 07/12/21: Degenerative disc disease throughout the lumbar region with loss of disc height. No apparent compressive stenosis of the canal. Discogenic endplate marrow changes without active edema at L2-3, L3-4 and L4-5, which could correlate with regional axial back pain.   Left lateral recess and foraminal narrowing at L2-3, left foraminal narrowing at L3-4, and bilateral lateral recess and foraminal narrowing at L4-5. None of the stenoses are severe or are seen to result in definite neural compression.   Shallow left posterolateral disc herniation at L1-2 with caudal migration of a tiny amount of disc material to the left of midline. This does not appear to cause any significant compressive effect upon the neural structures.  COGNITION: Overall cognitive status: Within functional limits for tasks assessed   SENSATION: WFL   POSTURE: rounded shoulders, forward head, and increased thoracic kyphosis  LOWER EXTREMITY ROM:   WNL LOWER EXTREMITY MMT:    MMT Right Eval Left Eval  Hip flexion 4+ 4+  Hip extension    Hip abduction 5 5  Hip adduction 5 5  Hip internal rotation    Hip external rotation    Knee flexion 5 5  Knee  extension 5 5  Ankle dorsiflexion 5 5  Ankle plantarflexion 5 5  Ankle inversion    Ankle eversion    (Blank rows = not tested)  BED MOBILITY: WNL    STAIRS: Level of Assistance: Complete Independence Stair Negotiation Technique: Alternating Pattern  with Bilateral Rails Number of Stairs: 4  Height of Stairs: 6    GAIT: Gait pattern:  forward posture and short step length bilateral Distance walked: 30 ft Assistive device utilized: None Level of assistance: Complete Independence   FUNCTIONAL TESTS:  5 times sit to stand: 13.07 6 minute walk test: 1414 ft  Mini Best Test:   PATIENT SURVEYS:  Modified Oswestry 10/50  FOTO 70, risk adjusted goal of 75  TODAY'S TREATMENT: DATE: 01/22/23  Unless otherwise stated, CGA was provided and gait belt donned in order to ensure pt safety    TE: [employer] -  promotes endurance and larger amplitude UE/LE movement LVL 2-3 x 6 min  NMR:    Gait with large amplitude technique with 2# wrist weights donned. Pt completes 5 laps of 148 ft. Continued focus on sustaining large amplitude arm-swing, upright posture. Cuing provided throughout, VC/demo. Pt with more difficulty increasing and sustaining large amplitude arm-swing. He fatigues more quickly today. Vitals taken post-intervention HR 68 bpm and SPO2% 96%  Large amplitude STS with bilat shoulder abduction (seated on foam) with 2.5# wrist weights 2x10. Reports difficult today.    [employer]- Static training- target in center with fading levels of UE support 2 rounds for BUE, UUE, and no UE support. Increasing challenge with each condition. Static training- target in center and EC BUE>UUE x 2 rounds of each  Pt requests session end early due to increased fatigue  PATIENT EDUCATION: Education details: Pt educated throughout session about proper posture and technique with exercises. Improved exercise technique, movement at target joints, use of target muscles after min  to mod verbal, visual, tactile cues.   Person educated: Patient Education method: Explanation, Demonstration, and Verbal cues Education comprehension: verbalized understanding, returned demonstration, and needs further education  HOME EXERCISE PROGRAM:Pt to continue HEP as listed below Seated PWR! Moves handout gone over and provided.  Standing PWR! Moves handout reviewed and provided.  All fours modified to chair UE support with PWR! moves handout reviewed and provided Access Code: [email or web address] URL: [email or web address] Date: 12/11/2022 Prepared by: Precious Bard Exercises - Straight Leg Raise with Arm Support  - 1 x daily - 7 x weekly - 2 sets - 10 reps - 5 hold - Single Leg  - 1 x daily - 7 x weekly - 2 sets - 2 reps - 30 hold    GOALS: Goals reviewed with patient? Yes  SHORT TERM GOALS: Target date: 10/24/2022    Patient will be independent in progressive home exercise program for PD to improve strength/mobility for better functional independence with ADLs. Baseline: No HEP for PD; 09/28/2022 per previous note pt currently perform PWR interventions as part of HEP; 10/31/22: Pt reports he feels comfortable with his HEP Goal status: MET   LONG TERM GOALS: Target date: 12/19/2022   1.  Patient  will complete five times sit to stand test in < 10 seconds indicating an increased LE strength and improved balance. Baseline: 13.07; 10/31/22 10.4 seconds hands-free; 12/11/22: 9.45 sec  Goal status:  MET   2.  Patient will increase FOTO score to equal to or greater than  75   to demonstrate statistically significant improvement in mobility and quality of life.  Baseline: 70; 10/31/2022: 71, 12/11/22: 82 Goal status: MET   3.  Patient will increase Mini best Balance score by > 4 points to demonstrate decreased fall risk during functional activities. Baseline: 19; 10/31/22: 23/28; 12/11/22: 25/28 Goal status: MET   4.  Patient will reduce dual task timed up and go to <11 seconds to reduce  fall risk and demonstrate improved transfer/gait ability. Baseline: 12.97l 10/31/22: 8 sec Goal status: MET  5.  Patient will increase six minute walk test distance to >1000 for progression to community ambulator and improve gait ability Baseline: Test visit 2 09/28/2022: 1414 ft; 10/31/2022: 1628 ft (previously met) Goal status: REVISED/MET 09/28/2022  6. Pt will improve modified oswestry score by 5 points  or greater in order to indicate improved function in relation to his back pain   Baseline: 10/50; 11/01/22: 60%   Goal status: MET 7. Patient will be able to put his pants on without use of furniture to lean on.   Baseline: unable to hold R leg up > 80 deg hip flexion for longer than 5 seconds without UE support   Goal status: Initial     ASSESSMENT:  CLINICAL IMPRESSION:   Pt still limited with today's session somewhat due to fatigue, but is highly motivated to participate.  He currently exhibits slight increase in difficulty with large-amplitude technique/sustaining arm-swing, but this is likely due to recovery from recent COVID infection. Will continue to monitor.   Pt will continue to benefit from skilled therapy to address remaining deficits in order to improve overall QoL and return to PLOF.     OBJECTIVE IMPAIRMENTS: Abnormal gait, decreased balance, decreased endurance, decreased mobility, difficulty walking, and hypomobility.   ACTIVITY LIMITATIONS: carrying, lifting, bending, standing, squatting, stairs, and locomotion level  PARTICIPATION LIMITATIONS: community activity  PERSONAL FACTORS: Age and 1-2 comorbidities: Asthma  are also affecting patient's functional outcome.   REHAB POTENTIAL: Good  CLINICAL DECISION MAKING: Stable/uncomplicated  EVALUATION COMPLEXITY: Low  PLAN:  PT FREQUENCY: 2x/week  PT DURATION: 12 weeks  PLANNED INTERVENTIONS: Therapeutic exercises, Therapeutic activity, Neuromuscular re-education, Balance training, Gait training, Patient/Family  education, Self Care, Joint mobilization, Joint manipulation, Stair training, Dry Needling, Spinal mobilization, Cryotherapy, Moist heat, and Manual therapy  PLAN FOR NEXT SESSION:   balance, low back pain, and PD specific impairments, emphasis on large arm swing, work on L SL stance with R hip flexion > 80 deg ROM with less UE dependence, continue plan    Temple Pacini PT, DPT  Physical Therapist - [facility]  [facility]  01/22/23, 5:02 PM

## 2023-01-24 ENCOUNTER — Ambulatory Visit: Payer: Medicare Other

## 2023-01-24 DIAGNOSIS — M6281 Muscle weakness (generalized): Secondary | ICD-10-CM | POA: Diagnosis not present

## 2023-01-24 DIAGNOSIS — M546 Pain in thoracic spine: Secondary | ICD-10-CM | POA: Diagnosis not present

## 2023-01-24 DIAGNOSIS — R2681 Unsteadiness on feet: Secondary | ICD-10-CM | POA: Diagnosis not present

## 2023-01-24 DIAGNOSIS — M545 Low back pain, unspecified: Secondary | ICD-10-CM | POA: Diagnosis not present

## 2023-01-24 DIAGNOSIS — R278 Other lack of coordination: Secondary | ICD-10-CM

## 2023-01-24 DIAGNOSIS — R2689 Other abnormalities of gait and mobility: Secondary | ICD-10-CM | POA: Diagnosis not present

## 2023-01-24 DIAGNOSIS — M5459 Other low back pain: Secondary | ICD-10-CM | POA: Diagnosis not present

## 2023-01-24 DIAGNOSIS — R262 Difficulty in walking, not elsewhere classified: Secondary | ICD-10-CM

## 2023-01-24 DIAGNOSIS — M25551 Pain in right hip: Secondary | ICD-10-CM | POA: Diagnosis not present

## 2023-01-24 DIAGNOSIS — R293 Abnormal posture: Secondary | ICD-10-CM | POA: Diagnosis not present

## 2023-01-24 DIAGNOSIS — G8929 Other chronic pain: Secondary | ICD-10-CM | POA: Diagnosis not present

## 2023-01-24 NOTE — Therapy (Signed)
OUTPATIENT PHYSICAL THERAPY NEURO TREATMENT NOTE   Patient Name: Daniel [name] MRN: [id number] DOB:17-Jan-1951, 72 y.o., male Today's Date: 01/24/2023   PCP:    Denzil Magnuson   REFERRING PROVIDER: Lonell Face, MD   END OF SESSION:  PT End of Session - 01/24/23 1614     Visit Number 25    Number of Visits 46    Date for PT Re-Evaluation 03/15/23    Authorization Type [employer] MEDICARE reporting period from 06/19/2022    Progress Note Due on Visit 10    PT Start Time 1612    PT Stop Time 1654    PT Time Calculation (min) 42 min    Equipment Utilized During Treatment Gait belt    Activity Tolerance Patient tolerated treatment well;Patient limited by fatigue    Behavior During Therapy [facility] for tasks assessed/performed                Past Medical History:  Diagnosis Date   Actinic keratosis    Asthma    Eczema    Hx of dysplastic nevus 06/06/2006   Mid lower back, slight atypia   Hypothyroidism    Seasonal allergies    Thyroid disease    Past Surgical History:  Procedure Laterality Date   CATARACT EXTRACTION     COLON SURGERY     COLONOSCOPY WITH PROPOFOL N/A 11/18/2014   Procedure: COLONOSCOPY WITH PROPOFOL;  Surgeon: Scot Jun, MD;  Location: [facility] ENDOSCOPY;  Service: Endoscopy;  Laterality: N/A;   COLONOSCOPY WITH PROPOFOL N/A 12/13/2021   Procedure: COLONOSCOPY WITH PROPOFOL;  Surgeon: Regis Bill, MD;  Location: [facility] ENDOSCOPY;  Service: Endoscopy;  Laterality: N/A;   EYE SURGERY     KNEE SURGERY     NASAL SEPTUM SURGERY     Patient Active Problem List   Diagnosis Date Noted   COVID-19 01/03/2023   Parkinson's disease 09/26/2022   Chronic idiopathic constipation 06/05/2022   Left lower quadrant abdominal pain 06/05/2022   Paroxysmal atrial fibrillation (HCC) 03/28/2022   Idiopathic hypotension 03/28/2022   Spondylolysis, lumbar region 03/28/2022   Avitaminosis D 03/28/2022   Myalgia due to statin 03/28/2022   Acquired  thrombophilia (HCC) 03/28/2022   Hyperlipidemia 03/28/2022   Abnormal gait 03/28/2022   Mild intermittent asthma without complication 03/28/2022   Zenker's (hypopharyngeal) diverticulum 03/22/2018   Gastroesophageal reflux disease 03/22/2018   Fatty liver 10/09/2017   BPH (benign prostatic hyperplasia) 11/21/2014   Hypothyroidism 11/21/2014   Inguinal hernia 11/21/2014   OA (osteoarthritis) 11/21/2014   H/O adenomatous polyp of colon 10/14/2014    ONSET DATE: 04/28/2022 for PD, back pain began around 09/10/21   REFERRING DIAG:  G20.A1 (ICD-10-CM) - Parkinson's disease  M54.9 (ICD-10-CM) - Back pain    THERAPY DIAG:  Muscle weakness (generalized)  Difficulty in walking, not elsewhere classified  Other lack of coordination  Unsteadiness on feet  Rationale for Evaluation and Treatment: Rehabilitation  SUBJECTIVE:  SUBJECTIVE STATEMENT:   Pt reports standing endurance has decreased from about 2-2.5 hrs to about 30 minutes since COVID infection. Pt reports no stumbles/falls. Pt reports no pain currently.    Pt accompanied by: self  PERTINENT HISTORY: Per [name] PT from 09/07/22 PT discharge from Low back treatment for focussed on PD related impairments at our clinic "Patient has attended 40 physical therapy treatment sessions since starting current episode of care on 12/20/2021. Patient at first struggled to make progress towards goals and has struggled at times with consistent attendance. However, over the past few weeks he has been improving with multi-discipline interventions including medications and increasing overall activity. It was also recently confirmed he has Parkinson's that is likely contributing to some of his abnormal posture and difficulty with full ROM during movements. Patient  is being discharged today due to improvement in condition and so he can start PT with focus on rehabilitation techniques for Parkinson's. Patient was provided with a long term HEP to help him with long term management of low back pain."  PAIN:  Are you having pain? Yes: NPRS scale: 3/10 Pain location: R knee  Pain description: sharp  Aggravating factors: when flexing the knee in weight bearing  Relieving factors: PT exercises, movement   PRECAUTIONS: None  WEIGHT BEARING RESTRICTIONS: No  FALLS: Has patient fallen in last 6 months? No  LIVING ENVIRONMENT: Lives with: lives alone Lives in: House/apartment Stairs: Yes: External: 5 steps; on left going up Has following equipment at home: None  PLOF: Independent  PATIENT GOALS: Improve his low back pain, improve posture, improve walking.   OBJECTIVE:   DIAGNOSTIC FINDINGS: MRI of L spine 07/12/21: Degenerative disc disease throughout the lumbar region with loss of disc height. No apparent compressive stenosis of the canal. Discogenic endplate marrow changes without active edema at L2-3, L3-4 and L4-5, which could correlate with regional axial back pain.   Left lateral recess and foraminal narrowing at L2-3, left foraminal narrowing at L3-4, and bilateral lateral recess and foraminal narrowing at L4-5. None of the stenoses are severe or are seen to result in definite neural compression.   Shallow left posterolateral disc herniation at L1-2 with caudal migration of a tiny amount of disc material to the left of midline. This does not appear to cause any significant compressive effect upon the neural structures.  COGNITION: Overall cognitive status: Within functional limits for tasks assessed   SENSATION: WFL   POSTURE: rounded shoulders, forward head, and increased thoracic kyphosis  LOWER EXTREMITY ROM:   WNL LOWER EXTREMITY MMT:    MMT Right Eval Left Eval  Hip flexion 4+ 4+  Hip extension    Hip abduction 5 5   Hip adduction 5 5  Hip internal rotation    Hip external rotation    Knee flexion 5 5  Knee extension 5 5  Ankle dorsiflexion 5 5  Ankle plantarflexion 5 5  Ankle inversion    Ankle eversion    (Blank rows = not tested)  BED MOBILITY: WNL    STAIRS: Level of Assistance: Complete Independence Stair Negotiation Technique: Alternating Pattern  with Bilateral Rails Number of Stairs: 4  Height of Stairs: 6    GAIT: Gait pattern:  forward posture and short step length bilateral Distance walked: 30 ft Assistive device utilized: None Level of assistance: Complete Independence   FUNCTIONAL TESTS:  5 times sit to stand: 13.07 6 minute walk test: 1414 ft  Mini Best Test:   PATIENT SURVEYS:  Modified Oswestry 10/50  FOTO 70, risk adjusted goal of 75     TODAY'S TREATMENT: DATE: 01/24/23  Unless otherwise stated, CGA was provided and gait belt donned in order to ensure pt safety   TE: [employer] -  promotes endurance and larger amplitude UE/LE movement LVL 3-4 x 7 min PT adjusts intensity throughout, monitors pt for response to intervention, cues for speed, provides close CGA for mount/dismount  NMR:   Gait with large amplitude technique with 2# wrist weights donned. Pt completes 4 laps of 148 ft. Continued focus on sustaining large amplitude arm-swing, upright posture. Cuing provided throughout, VC/demo. Pt still with difficulty increasing and sustaining large amplitude arm-swing, but does appear to have modestly improved from last visit.Marland Kitchen continued fatigue following intervention --Repeats intervention with PVC each UE so PT can assist with technique - pt completes 3 laps of 148 ft --Repeats without PVC assist to determine carryover, pt completes 3 laps of 148 ft. Still requires frequent cues for posture and arm-swing, but does maintain larger amplitude movements.  Other comments: Pt rates intervention moderate, intervention very fatiguing for pt   Large amplitude  standing LTL step and reach 10x each side   Large amplitude FWD step and reach 2x10 stepping FWD with each LE. Pt reports doesn't feel fully steady with intervention  Large amplitude STS with bilat shoulder abduction (seated on foam) with 2# wrist weights 2x10.    PATIENT EDUCATION: Education details: Pt educated throughout session about proper posture and technique with exercises. Improved exercise technique, movement at target joints, use of target muscles after min to mod verbal, visual, tactile cues.   Person educated: Patient Education method: Explanation, Demonstration, and Verbal cues Education comprehension: verbalized understanding, returned demonstration, and needs further education  HOME EXERCISE PROGRAM:Pt to continue HEP as listed below Seated PWR! Moves handout gone over and provided.  Standing PWR! Moves handout reviewed and provided.  All fours modified to chair UE support with PWR! moves handout reviewed and provided Access Code: [email or web address] URL: [email or web address] Date: 12/11/2022 Prepared by: Precious Bard Exercises - Straight Leg Raise with Arm Support  - 1 x daily - 7 x weekly - 2 sets - 10 reps - 5 hold - Single Leg  - 1 x daily - 7 x weekly - 2 sets - 2 reps - 30 hold    GOALS: Goals reviewed with patient? Yes  SHORT TERM GOALS: Target date: 10/24/2022    Patient will be independent in progressive home exercise program for PD to improve strength/mobility for better functional independence with ADLs. Baseline: No HEP for PD; 09/28/2022 per previous note pt currently perform PWR interventions as part of HEP; 10/31/22: Pt reports he feels comfortable with his HEP Goal status: MET   LONG TERM GOALS: Target date: 12/19/2022   1.  Patient  will complete five times sit to stand test in < 10 seconds indicating an increased LE strength and improved balance. Baseline: 13.07; 10/31/22 10.4 seconds hands-free; 12/11/22: 9.45 sec  Goal status:  MET   2.  Patient  will increase FOTO score to equal to or greater than  75   to demonstrate statistically significant improvement in mobility and quality of life.  Baseline: 70; 10/31/2022: 71, 12/11/22: 82 Goal status: MET   3.  Patient will increase Mini best Balance score by > 4 points to demonstrate decreased fall risk during functional activities. Baseline: 19; 10/31/22: 23/28; 12/11/22: 25/28 Goal status: MET   4.  Patient will reduce dual task timed  up and go to <11 seconds to reduce fall risk and demonstrate improved transfer/gait ability. Baseline: 12.97l 10/31/22: 8 sec Goal status: MET  5.  Patient will increase six minute walk test distance to >1000 for progression to community ambulator and improve gait ability Baseline: Test visit 2 09/28/2022: 1414 ft; 10/31/2022: 1628 ft (previously met) Goal status: REVISED/MET 09/28/2022  6. Pt will improve modified oswestry score by 5 points or greater in order to indicate improved function in relation to his back pain   Baseline: 10/50; 11/01/22: 60%   Goal status: MET 7. Patient will be able to put his pants on without use of furniture to lean on.   Baseline: unable to hold R leg up > 80 deg hip flexion for longer than 5 seconds without UE support   Goal status: Initial     ASSESSMENT:  CLINICAL IMPRESSION:   Pt still limited by fatigue, but does show progress by tolerating full session today and greater amount of time ambulating and performing dynamic tasks. Pt will continue to benefit from skilled therapy to address remaining deficits in order to improve overall QoL and return to PLOF.     OBJECTIVE IMPAIRMENTS: Abnormal gait, decreased balance, decreased endurance, decreased mobility, difficulty walking, and hypomobility.   ACTIVITY LIMITATIONS: carrying, lifting, bending, standing, squatting, stairs, and locomotion level  PARTICIPATION LIMITATIONS: community activity  PERSONAL FACTORS: Age and 1-2 comorbidities: Asthma  are also affecting patient's  functional outcome.   REHAB POTENTIAL: Good  CLINICAL DECISION MAKING: Stable/uncomplicated  EVALUATION COMPLEXITY: Low  PLAN:  PT FREQUENCY: 2x/week  PT DURATION: 12 weeks  PLANNED INTERVENTIONS: Therapeutic exercises, Therapeutic activity, Neuromuscular re-education, Balance training, Gait training, Patient/Family education, Self Care, Joint mobilization, Joint manipulation, Stair training, Dry Needling, Spinal mobilization, Cryotherapy, Moist heat, and Manual therapy  PLAN FOR NEXT SESSION:   balance, low back pain, and PD specific impairments, emphasis on large arm swing, work on L SL stance with R hip flexion > 80 deg ROM with less UE dependence, continue plan    Temple Pacini PT, DPT  Physical Therapist - [facility]  [facility]  01/24/23, 5:00 PM

## 2023-01-25 ENCOUNTER — Ambulatory Visit: Payer: Medicare Other | Admitting: Podiatry

## 2023-01-25 DIAGNOSIS — L600 Ingrowing nail: Secondary | ICD-10-CM

## 2023-01-25 DIAGNOSIS — L03031 Cellulitis of right toe: Secondary | ICD-10-CM

## 2023-01-25 MED ORDER — DOXYCYCLINE HYCLATE 100 MG PO TABS: 100.0000 mg | ORAL_TABLET | Freq: Two times a day (BID) | ORAL | 0 refills | Status: DC

## 2023-01-25 NOTE — Progress Notes (Signed)
Subjective:  Patient ID: Daniel [name], male    DOB: 06/04/1950,  MRN: [id number]  Chief Complaint  Patient presents with   Nail Problem    72 y.o. male presents with the above complaint.  Patient presents with right hallux medial border paronychia.  Patient had a remote ingrown removed by Dr. Logan Bores as got a little infected he wanted to discuss treatment options.  He is currently not taking antibiotics denies any other acute complaints   Review of Systems: Negative except as noted in the HPI. Denies N/V/F/Ch.  Past Medical History:  Diagnosis Date   Actinic keratosis    Asthma    Eczema    Hx of dysplastic nevus 06/06/2006   Mid lower back, slight atypia   Hypothyroidism    Seasonal allergies    Thyroid disease     Current Outpatient Medications:    doxycycline (VIBRA-TABS) 100 MG tablet, Take 1 tablet (100 mg total) by mouth 2 (two) times daily., Disp: 20 tablet, Rfl: 0   carbidopa-levodopa (PARCOPA) 25-100 MG disintegrating tablet, Take 1 tablet by mouth 3 (three) times daily., Disp: , Rfl:    Cholecalciferol (VITAMIN D) 125 MCG (5000 UT) CAPS, Take 5,000 Units by mouth daily., Disp: , Rfl:    docusate sodium (COLACE) 100 MG capsule, Take 200 mg by mouth at bedtime., Disp: , Rfl:    ELIQUIS 5 MG TABS tablet, Take 5 mg by mouth 2 (two) times daily., Disp: , Rfl:    levothyroxine (SYNTHROID) 100 MCG tablet, TAKE 1 TABLET BY MOUTH EVERY DAY BEFORE BREAKFAST, Disp: 90 tablet, Rfl: 1   metoprolol succinate (TOPROL-XL) 25 MG 24 hr tablet, Take 25 mg by mouth daily., Disp: , Rfl:    pantoprazole (PROTONIX) 40 MG tablet, Take 1 tablet (40 mg total) by mouth daily., Disp: 90 tablet, Rfl: 3   polyethylene glycol (MIRALAX / GLYCOLAX) 17 g packet, Take 17 g by mouth daily as needed for moderate constipation or severe constipation., Disp: , Rfl:    predniSONE (DELTASONE) 20 MG tablet, TAKE 1 TABLET(20 MG) BY MOUTH DAILY WITH BREAKFAST (Patient not taking: Reported on 01/16/2023), Disp: 4  tablet, Rfl: 1   traMADol (ULTRAM) 50 MG tablet, Take 75 mg by mouth 2 (two) times daily., Disp: , Rfl:   Social History   Tobacco Use  Smoking Status Former   Current packs/day: 0.00   Types: Cigarettes   Quit date: 1991   Years since quitting: 33.6  Smokeless Tobacco Never  Tobacco Comments   quit smoking 25 years ago    Allergies  Allergen Reactions   Ciprofloxacin Hcl Other (See Comments)    Blisters break out   Levaquin [Levofloxacin In D5w]    Sulfa Antibiotics Other (See Comments)    unknown   Objective:  There were no vitals filed for this visit. There is no height or weight on file to calculate BMI. Constitutional Well developed. Well nourished.  Vascular Dorsalis pedis pulses palpable bilaterally. Posterior tibial pulses palpable bilaterally. Capillary refill normal to all digits.  No cyanosis or clubbing noted. Pedal hair growth normal.  Neurologic Normal speech. Oriented to person, place, and time. Epicritic sensation to light touch grossly present bilaterally.  Dermatologic Right hallux lateral border ingrown painful to touch healing well.  Mild erythema noted circumferentially consistent with paronychia  Orthopedic: Normal joint ROM without pain or crepitus bilaterally. No visible deformities. No bony tenderness.   Radiographs: None Assessment:   1. Paronychia of toe of right foot due to  ingrown toenail    Plan:  Patient was evaluated and treated and all questions answered.  Right hallux paronychia with a history of ingrown removal recently -All questions and concerns were discussed with the patient extensive detail -Given the amount of pain that he is having in the setting of redness patient will benefit from doxycycline doxycycline was sent to the pharmacy.  If it continues to regress have asked him to come back and see me or go to the emergency room he states understanding -  No follow-ups on file.

## 2023-01-31 ENCOUNTER — Ambulatory Visit: Payer: Medicare Other | Attending: Neurology

## 2023-01-31 ENCOUNTER — Encounter: Payer: Self-pay | Admitting: Podiatry

## 2023-01-31 DIAGNOSIS — R2689 Other abnormalities of gait and mobility: Secondary | ICD-10-CM | POA: Diagnosis not present

## 2023-01-31 DIAGNOSIS — M6281 Muscle weakness (generalized): Secondary | ICD-10-CM | POA: Diagnosis not present

## 2023-01-31 DIAGNOSIS — R262 Difficulty in walking, not elsewhere classified: Secondary | ICD-10-CM | POA: Insufficient documentation

## 2023-01-31 DIAGNOSIS — R2681 Unsteadiness on feet: Secondary | ICD-10-CM | POA: Diagnosis not present

## 2023-01-31 NOTE — Therapy (Signed)
OUTPATIENT PHYSICAL THERAPY NEURO TREATMENT NOTE   Patient Name: Daniel [name] MRN: [id number] DOB:1951-04-24, 72 y.o., male Today's Date: 01/31/2023   PCP:    Denzil Magnuson   REFERRING PROVIDER: Lonell Face, MD   END OF SESSION:  PT End of Session - 01/31/23 1627     Visit Number 26    Number of Visits 46    Date for PT Re-Evaluation 03/15/23    Authorization Type [employer] MEDICARE reporting period from 06/19/2022    Progress Note Due on Visit 10    PT Start Time 1617    PT Stop Time 1658    PT Time Calculation (min) 41 min    Equipment Utilized During Treatment Gait belt    Activity Tolerance Patient tolerated treatment well;Patient limited by fatigue    Behavior During Therapy [facility] for tasks assessed/performed                 Past Medical History:  Diagnosis Date   Actinic keratosis    Asthma    Eczema    Hx of dysplastic nevus 06/06/2006   Mid lower back, slight atypia   Hypothyroidism    Seasonal allergies    Thyroid disease    Past Surgical History:  Procedure Laterality Date   CATARACT EXTRACTION     COLON SURGERY     COLONOSCOPY WITH PROPOFOL N/A 11/18/2014   Procedure: COLONOSCOPY WITH PROPOFOL;  Surgeon: Scot Jun, MD;  Location: [facility] ENDOSCOPY;  Service: Endoscopy;  Laterality: N/A;   COLONOSCOPY WITH PROPOFOL N/A 12/13/2021   Procedure: COLONOSCOPY WITH PROPOFOL;  Surgeon: Regis Bill, MD;  Location: [facility] ENDOSCOPY;  Service: Endoscopy;  Laterality: N/A;   EYE SURGERY     KNEE SURGERY     NASAL SEPTUM SURGERY     Patient Active Problem List   Diagnosis Date Noted   COVID-19 01/03/2023   Parkinson's disease 09/26/2022   Chronic idiopathic constipation 06/05/2022   Left lower quadrant abdominal pain 06/05/2022   Paroxysmal atrial fibrillation (HCC) 03/28/2022   Idiopathic hypotension 03/28/2022   Spondylolysis, lumbar region 03/28/2022   Avitaminosis D 03/28/2022   Myalgia due to statin 03/28/2022   Acquired  thrombophilia (HCC) 03/28/2022   Hyperlipidemia 03/28/2022   Abnormal gait 03/28/2022   Mild intermittent asthma without complication 03/28/2022   Zenker's (hypopharyngeal) diverticulum 03/22/2018   Gastroesophageal reflux disease 03/22/2018   Fatty liver 10/09/2017   BPH (benign prostatic hyperplasia) 11/21/2014   Hypothyroidism 11/21/2014   Inguinal hernia 11/21/2014   OA (osteoarthritis) 11/21/2014   H/O adenomatous polyp of colon 10/14/2014    ONSET DATE: 04/28/2022 for PD, back pain began around 09/10/21   REFERRING DIAG:  G20.A1 (ICD-10-CM) - Parkinson's disease  M54.9 (ICD-10-CM) - Back pain    THERAPY DIAG:  Muscle weakness (generalized)  Difficulty in walking, not elsewhere classified  Unsteadiness on feet  Rationale for Evaluation and Treatment: Rehabilitation  SUBJECTIVE:  SUBJECTIVE STATEMENT:   Pt repots endurance has improved some since last time. Pt reports he woke up with 9/10 back pain but this improved following lying flat and taking a hot shower, he also notes walking to his car helped.    Pt accompanied by: self  PERTINENT HISTORY: Per [name] PT from 09/07/22 PT discharge from Low back treatment for focussed on PD related impairments at our clinic "Patient has attended 40 physical therapy treatment sessions since starting current episode of care on 12/20/2021. Patient at first struggled to make progress towards goals and has struggled at times with consistent attendance. However, over the past few weeks he has been improving with multi-discipline interventions including medications and increasing overall activity. It was also recently confirmed he has Parkinson's that is likely contributing to some of his abnormal posture and difficulty with full ROM during movements.  Patient is being discharged today due to improvement in condition and so he can start PT with focus on rehabilitation techniques for Parkinson's. Patient was provided with a long term HEP to help him with long term management of low back pain."  PAIN:  Are you having pain? Yes: NPRS scale: 3/10 Pain location: R knee  Pain description: sharp  Aggravating factors: when flexing the knee in weight bearing  Relieving factors: PT exercises, movement   PRECAUTIONS: None  WEIGHT BEARING RESTRICTIONS: No  FALLS: Has patient fallen in last 6 months? No  LIVING ENVIRONMENT: Lives with: lives alone Lives in: House/apartment Stairs: Yes: External: 5 steps; on left going up Has following equipment at home: None  PLOF: Independent  PATIENT GOALS: Improve his low back pain, improve posture, improve walking.   OBJECTIVE:   DIAGNOSTIC FINDINGS: MRI of L spine 07/12/21: Degenerative disc disease throughout the lumbar region with loss of disc height. No apparent compressive stenosis of the canal. Discogenic endplate marrow changes without active edema at L2-3, L3-4 and L4-5, which could correlate with regional axial back pain.   Left lateral recess and foraminal narrowing at L2-3, left foraminal narrowing at L3-4, and bilateral lateral recess and foraminal narrowing at L4-5. None of the stenoses are severe or are seen to result in definite neural compression.   Shallow left posterolateral disc herniation at L1-2 with caudal migration of a tiny amount of disc material to the left of midline. This does not appear to cause any significant compressive effect upon the neural structures.  COGNITION: Overall cognitive status: Within functional limits for tasks assessed   SENSATION: WFL   POSTURE: rounded shoulders, forward head, and increased thoracic kyphosis  LOWER EXTREMITY ROM:   WNL LOWER EXTREMITY MMT:    MMT Right Eval Left Eval  Hip flexion 4+ 4+  Hip extension    Hip abduction  5 5  Hip adduction 5 5  Hip internal rotation    Hip external rotation    Knee flexion 5 5  Knee extension 5 5  Ankle dorsiflexion 5 5  Ankle plantarflexion 5 5  Ankle inversion    Ankle eversion    (Blank rows = not tested)  BED MOBILITY: WNL    STAIRS: Level of Assistance: Complete Independence Stair Negotiation Technique: Alternating Pattern  with Bilateral Rails Number of Stairs: 4  Height of Stairs: 6    GAIT: Gait pattern:  forward posture and short step length bilateral Distance walked: 30 ft Assistive device utilized: None Level of assistance: Complete Independence   FUNCTIONAL TESTS:  5 times sit to stand: 13.07 6 minute walk test:  1414 ft  Mini Best Test:   PATIENT SURVEYS:  Modified Oswestry 10/50  FOTO 70, risk adjusted goal of 75     TODAY'S TREATMENT: DATE: 01/31/23  Unless otherwise stated, CGA was provided and gait belt donned in order to ensure pt safety   TE: [employer] -  promotes endurance and larger amplitude UE/LE movement LVL 3-5 x 7 min PT adjusts intensity throughout, monitors pt for response to intervention, cues for speed, provides close CGA for mount/dismount  NMR:   Gait with large amplitude technique with 2.5# wrist weights donned. Pt completes 4 laps of 148 ft. Continued focus on sustaining large amplitude arm-swing, upright posture. Cuing provided throughout, VC/demo. Pt requires rest break following intervention due to fatigue --Repeats intervention with PVC each UE so PT can assist with technique - pt completes 444 ft. Fatiguing, pt rates hard --Repeats without PVC assist to determine carryover, pt completes 444 ft. Continued cues provided for increased amplitude of L arm swing   Performed on blue mat for slight increase in balance challenge: 2.5# wrist weights donned  --Large amplitude standing LTL step and reach 2x10 each side. Rates medium --Large amplitude FWD step. Attempts, but limited due to R knee discomfort   --Large amplitude twist in standing with BUE shoulder abduction 2x10 each direction. Rates easy    PATIENT EDUCATION: Education details: Pt educated throughout session about proper posture and technique with exercises. Improved exercise technique, movement at target joints, use of target muscles after min to mod verbal, visual, tactile cues.   Person educated: Patient Education method: Explanation, Demonstration, and Verbal cues Education comprehension: verbalized understanding, returned demonstration, and needs further education  HOME EXERCISE PROGRAM:Pt to continue HEP as listed below Seated PWR! Moves handout gone over and provided.  Standing PWR! Moves handout reviewed and provided.  All fours modified to chair UE support with PWR! moves handout reviewed and provided Access Code: [email or web address] URL: [email or web address] Date: 12/11/2022 Prepared by: Precious Bard Exercises - Straight Leg Raise with Arm Support  - 1 x daily - 7 x weekly - 2 sets - 10 reps - 5 hold - Single Leg  - 1 x daily - 7 x weekly - 2 sets - 2 reps - 30 hold    GOALS: Goals reviewed with patient? Yes  SHORT TERM GOALS: Target date: 10/24/2022    Patient will be independent in progressive home exercise program for PD to improve strength/mobility for better functional independence with ADLs. Baseline: No HEP for PD; 09/28/2022 per previous note pt currently perform PWR interventions as part of HEP; 10/31/22: Pt reports he feels comfortable with his HEP Goal status: MET   LONG TERM GOALS: Target date: 12/19/2022   1.  Patient  will complete five times sit to stand test in < 10 seconds indicating an increased LE strength and improved balance. Baseline: 13.07; 10/31/22 10.4 seconds hands-free; 12/11/22: 9.45 sec  Goal status:  MET   2.  Patient will increase FOTO score to equal to or greater than  75   to demonstrate statistically significant improvement in mobility and quality of life.  Baseline: 70;  10/31/2022: 71, 12/11/22: 82 Goal status: MET   3.  Patient will increase Mini best Balance score by > 4 points to demonstrate decreased fall risk during functional activities. Baseline: 19; 10/31/22: 23/28; 12/11/22: 25/28 Goal status: MET   4.  Patient will reduce dual task timed up and go to <11 seconds to reduce fall risk and demonstrate improved transfer/gait ability. Baseline:  12.97l 10/31/22: 8 sec Goal status: MET  5.  Patient will increase six minute walk test distance to >1000 for progression to community ambulator and improve gait ability Baseline: Test visit 2 09/28/2022: 1414 ft; 10/31/2022: 1628 ft (previously met) Goal status: REVISED/MET 09/28/2022  6. Pt will improve modified oswestry score by 5 points or greater in order to indicate improved function in relation to his back pain   Baseline: 10/50; 11/01/22: 60%   Goal status: MET 7. Patient will be able to put his pants on without use of furniture to lean on.   Baseline: unable to hold R leg up > 80 deg hip flexion for longer than 5 seconds without UE support   Goal status: Initial     ASSESSMENT:  CLINICAL IMPRESSION:   Pt able to advance exercises with use of increased weights and/or on more difficult surfaces. While pt is able to tolerate these modifications, he is still limited by fatigue. Pt will continue to benefit from skilled therapy to address remaining deficits in order to improve overall QoL and return to PLOF.     OBJECTIVE IMPAIRMENTS: Abnormal gait, decreased balance, decreased endurance, decreased mobility, difficulty walking, and hypomobility.   ACTIVITY LIMITATIONS: carrying, lifting, bending, standing, squatting, stairs, and locomotion level  PARTICIPATION LIMITATIONS: community activity  PERSONAL FACTORS: Age and 1-2 comorbidities: Asthma  are also affecting patient's functional outcome.   REHAB POTENTIAL: Good  CLINICAL DECISION MAKING: Stable/uncomplicated  EVALUATION COMPLEXITY: Low  PLAN:  PT  FREQUENCY: 2x/week  PT DURATION: 12 weeks  PLANNED INTERVENTIONS: Therapeutic exercises, Therapeutic activity, Neuromuscular re-education, Balance training, Gait training, Patient/Family education, Self Care, Joint mobilization, Joint manipulation, Stair training, Dry Needling, Spinal mobilization, Cryotherapy, Moist heat, and Manual therapy  PLAN FOR NEXT SESSION:   balance, low back pain, and PD specific impairments, emphasis on large arm swing, work on L SL stance with R hip flexion > 80 deg ROM with less UE dependence, continue plan    Temple Pacini PT, DPT  Physical Therapist - [facility]  [facility]  01/31/23, 5:01 PM

## 2023-02-06 ENCOUNTER — Encounter: Payer: Self-pay | Admitting: Podiatry

## 2023-02-06 ENCOUNTER — Ambulatory Visit: Payer: Medicare Other | Admitting: Podiatry

## 2023-02-06 DIAGNOSIS — L600 Ingrowing nail: Secondary | ICD-10-CM

## 2023-02-06 DIAGNOSIS — L03031 Cellulitis of right toe: Secondary | ICD-10-CM

## 2023-02-06 MED ORDER — AMOXICILLIN-POT CLAVULANATE 875-125 MG PO TABS: 1.0000 | ORAL_TABLET | Freq: Two times a day (BID) | ORAL | 0 refills | Status: DC

## 2023-02-06 NOTE — Progress Notes (Signed)
   Chief Complaint  Patient presents with   Nail Problem    "It's still red and I'm getting some discharge.  The Doxycycline doesn't seems to work as fast as some of the other stuff."    Subjective: 72 y.o. male presents today status post permanent nail avulsion procedure of the medial border of the right great toe that was performed on 01/16/2023.  Since that time the patient was concern for infection and doxycycline was sent to the pharmacy.  He has completed the oral doxycycline.  He continues to have some redness and swelling to the area.  Presenting for further treatment and evaluation.   Past Medical History:  Diagnosis Date   Actinic keratosis    Asthma    Eczema    Hx of dysplastic nevus 06/06/2006   Mid lower back, slight atypia   Hypothyroidism    Seasonal allergies    Thyroid disease     Objective: Neurovascular status intact.  There is some slight drainage coming from the nail matricectomy site.  Associated erythema around the area.  There is concern of some residual tissue or nail within the avulsion site.  No malodor and the erythema is localized just to the toe  Assessment: #1 s/p partial permanent nail matrixectomy medial border right great toe.  01/16/2023 #2 possible residual tissue within the avulsion site right great toe   Plan of care: -Patient was evaluated  -After evaluating the toe I do believe it is appropriate to reanesthetized the digit for additional debridement to ensure that there is no residual nail or tissue within the avulsion site.  The patient consents and agrees.  He has failed outpatient oral antibiotics. -The toe was prepped in aseptic manner and digital block performed using 3 mL of 2% lidocaine plain.  The nail avulsion site was debrided and there was some additional tissue that was removed from the avulsion site.  Additional nail was also debrided from the area.  Patient tolerated this well -Dressing applied. -Continue daily soaking with  triple antibiotic and a Band-Aid -The patient has had good success in the past with Augmentin.  Prescription for Augmentin 875/125 mg #20 -Return to clinic 2 weeks   Felecia Shelling, DPM [facility]  Dr. Felecia Shelling, DPM    [address] [place], [place] [address]                Office [phone]  Fax [phone]

## 2023-02-07 ENCOUNTER — Ambulatory Visit: Payer: Medicare Other

## 2023-02-12 ENCOUNTER — Ambulatory Visit: Payer: Medicare Other

## 2023-02-12 DIAGNOSIS — R2689 Other abnormalities of gait and mobility: Secondary | ICD-10-CM | POA: Diagnosis not present

## 2023-02-12 DIAGNOSIS — R2681 Unsteadiness on feet: Secondary | ICD-10-CM | POA: Diagnosis not present

## 2023-02-12 DIAGNOSIS — R262 Difficulty in walking, not elsewhere classified: Secondary | ICD-10-CM | POA: Diagnosis not present

## 2023-02-12 DIAGNOSIS — M6281 Muscle weakness (generalized): Secondary | ICD-10-CM

## 2023-02-12 NOTE — Therapy (Signed)
OUTPATIENT PHYSICAL THERAPY NEURO TREATMENT NOTE   Patient Name: Daniel [name] MRN: [id number] DOB:February 04, 1951, 72 y.o., male Today's Date: 02/12/2023   PCP:    Denzil Magnuson   REFERRING PROVIDER: Lonell Face, MD   END OF SESSION:  PT End of Session - 02/12/23 0928     Visit Number 27    Number of Visits 46    Date for PT Re-Evaluation 03/15/23    Authorization Type [employer] MEDICARE reporting period from 06/19/2022    Progress Note Due on Visit 10    PT Start Time 0930    PT Stop Time 1014    PT Time Calculation (min) 44 min    Equipment Utilized During Treatment Gait belt    Activity Tolerance Patient tolerated treatment well;Patient limited by fatigue    Behavior During Therapy [facility] for tasks assessed/performed                  Past Medical History:  Diagnosis Date   Actinic keratosis    Asthma    Eczema    Hx of dysplastic nevus 06/06/2006   Mid lower back, slight atypia   Hypothyroidism    Seasonal allergies    Thyroid disease    Past Surgical History:  Procedure Laterality Date   CATARACT EXTRACTION     COLON SURGERY     COLONOSCOPY WITH PROPOFOL N/A 11/18/2014   Procedure: COLONOSCOPY WITH PROPOFOL;  Surgeon: Scot Jun, MD;  Location: [facility] ENDOSCOPY;  Service: Endoscopy;  Laterality: N/A;   COLONOSCOPY WITH PROPOFOL N/A 12/13/2021   Procedure: COLONOSCOPY WITH PROPOFOL;  Surgeon: Regis Bill, MD;  Location: [facility] ENDOSCOPY;  Service: Endoscopy;  Laterality: N/A;   EYE SURGERY     KNEE SURGERY     NASAL SEPTUM SURGERY     Patient Active Problem List   Diagnosis Date Noted   COVID-19 01/03/2023   Parkinson's disease 09/26/2022   Chronic idiopathic constipation 06/05/2022   Left lower quadrant abdominal pain 06/05/2022   Paroxysmal atrial fibrillation (HCC) 03/28/2022   Idiopathic hypotension 03/28/2022   Spondylolysis, lumbar region 03/28/2022   Avitaminosis D 03/28/2022   Myalgia due to statin 03/28/2022   Acquired  thrombophilia (HCC) 03/28/2022   Hyperlipidemia 03/28/2022   Abnormal gait 03/28/2022   Mild intermittent asthma without complication 03/28/2022   Zenker's (hypopharyngeal) diverticulum 03/22/2018   Gastroesophageal reflux disease 03/22/2018   Fatty liver 10/09/2017   BPH (benign prostatic hyperplasia) 11/21/2014   Hypothyroidism 11/21/2014   Inguinal hernia 11/21/2014   OA (osteoarthritis) 11/21/2014   H/O adenomatous polyp of colon 10/14/2014    ONSET DATE: 04/28/2022 for PD, back pain began around 09/10/21   REFERRING DIAG:  G20.A1 (ICD-10-CM) - Parkinson's disease  M54.9 (ICD-10-CM) - Back pain    THERAPY DIAG:  Muscle weakness (generalized)  Difficulty in walking, not elsewhere classified  Unsteadiness on feet  Other abnormalities of gait and mobility  Rationale for Evaluation and Treatment: Rehabilitation  SUBJECTIVE:  SUBJECTIVE STATEMENT:   Patient reports he walked a lot while in [place]. Missed last week due to an ingrown toenail. Reports it is better now, is still on antibiotic at this time. Reports he was busy yesterday at church.    Pt accompanied by: self  PERTINENT HISTORY: Per [name] PT from 09/07/22 PT discharge from Low back treatment for focussed on PD related impairments at our clinic "Patient has attended 40 physical therapy treatment sessions since starting current episode of care on 12/20/2021. Patient at first struggled to make progress towards goals and has struggled at times with consistent attendance. However, over the past few weeks he has been improving with multi-discipline interventions including medications and increasing overall activity. It was also recently confirmed he has Parkinson's that is likely contributing to some of his abnormal posture and  difficulty with full ROM during movements. Patient is being discharged today due to improvement in condition and so he can start PT with focus on rehabilitation techniques for Parkinson's. Patient was provided with a long term HEP to help him with long term management of low back pain."  PAIN:  Are you having pain? Yes: NPRS scale: 3/10 Pain location: R knee  Pain description: sharp  Aggravating factors: when flexing the knee in weight bearing  Relieving factors: PT exercises, movement   PRECAUTIONS: None  WEIGHT BEARING RESTRICTIONS: No  FALLS: Has patient fallen in last 6 months? No  LIVING ENVIRONMENT: Lives with: lives alone Lives in: House/apartment Stairs: Yes: External: 5 steps; on left going up Has following equipment at home: None  PLOF: Independent  PATIENT GOALS: Improve his low back pain, improve posture, improve walking.   OBJECTIVE:   DIAGNOSTIC FINDINGS: MRI of L spine 07/12/21: Degenerative disc disease throughout the lumbar region with loss of disc height. No apparent compressive stenosis of the canal. Discogenic endplate marrow changes without active edema at L2-3, L3-4 and L4-5, which could correlate with regional axial back pain.   Left lateral recess and foraminal narrowing at L2-3, left foraminal narrowing at L3-4, and bilateral lateral recess and foraminal narrowing at L4-5. None of the stenoses are severe or are seen to result in definite neural compression.   Shallow left posterolateral disc herniation at L1-2 with caudal migration of a tiny amount of disc material to the left of midline. This does not appear to cause any significant compressive effect upon the neural structures.  COGNITION: Overall cognitive status: Within functional limits for tasks assessed   SENSATION: WFL   POSTURE: rounded shoulders, forward head, and increased thoracic kyphosis  LOWER EXTREMITY ROM:   WNL LOWER EXTREMITY MMT:    MMT Right Eval Left Eval  Hip  flexion 4+ 4+  Hip extension    Hip abduction 5 5  Hip adduction 5 5  Hip internal rotation    Hip external rotation    Knee flexion 5 5  Knee extension 5 5  Ankle dorsiflexion 5 5  Ankle plantarflexion 5 5  Ankle inversion    Ankle eversion    (Blank rows = not tested)  BED MOBILITY: WNL    STAIRS: Level of Assistance: Complete Independence Stair Negotiation Technique: Alternating Pattern  with Bilateral Rails Number of Stairs: 4  Height of Stairs: 6    GAIT: Gait pattern:  forward posture and short step length bilateral Distance walked: 30 ft Assistive device utilized: None Level of assistance: Complete Independence   FUNCTIONAL TESTS:  5 times sit to stand: 13.07 6 minute walk test: 1414  ft  Mini Best Test:   PATIENT SURVEYS:  Modified Oswestry 10/50  FOTO 70, risk adjusted goal of 75     TODAY'S TREATMENT: DATE: 02/12/23  Unless otherwise stated, CGA was provided and gait belt donned in order to ensure pt safety   TE: Nustep Lvl 5; seat position 11, decreased to lvl 4 due to R knee pain 5 minutes. Seated:  Lateral twist and rotation 10x  PT adjusts intensity throughout, monitors pt for response to intervention, cues for speed, provides close CGA for mount/dismount  NMR:   Gait with large amplitude technique with 2.5# wrist weights donned. Pt completes 4 laps of 148 ft. Continued focus on sustaining large amplitude arm-swing, upright posture. Cuing provided throughout, VC/demo. Pt requires rest break following intervention due to fatigue --Repeats intervention with PVC each UE so PT can assist with technique - pt completes 444 ft. Fatiguing, pt rates hard --Repeats without PVC assist to determine carryover, pt completes 444 ft. Continued cues provided for increased amplitude of L arm swing   Performed on blue mat for slight increase in balance challenge: 2.5# wrist weights donned  --Large amplitude standing LTL step and reach 2x10 each side. Rates  medium --Large amplitude FWD step. Terminated at 8 due to R knee pain and feeling of buckling.  --Large amplitude twist in standing with BUE shoulder abduction 2x10 each direction. Rates easy     PATIENT EDUCATION: Education details: Pt educated throughout session about proper posture and technique with exercises. Improved exercise technique, movement at target joints, use of target muscles after min to mod verbal, visual, tactile cues.   Person educated: Patient Education method: Explanation, Demonstration, and Verbal cues Education comprehension: verbalized understanding, returned demonstration, and needs further education  HOME EXERCISE PROGRAM:Pt to continue HEP as listed below Seated PWR! Moves handout gone over and provided.  Standing PWR! Moves handout reviewed and provided.  All fours modified to chair UE support with PWR! moves handout reviewed and provided Access Code: [email or web address] URL: [email or web address] Date: 12/11/2022 Prepared by: Precious Bard Exercises - Straight Leg Raise with Arm Support  - 1 x daily - 7 x weekly - 2 sets - 10 reps - 5 hold - Single Leg  - 1 x daily - 7 x weekly - 2 sets - 2 reps - 30 hold    GOALS: Goals reviewed with patient? Yes  SHORT TERM GOALS: Target date: 10/24/2022    Patient will be independent in progressive home exercise program for PD to improve strength/mobility for better functional independence with ADLs. Baseline: No HEP for PD; 09/28/2022 per previous note pt currently perform PWR interventions as part of HEP; 10/31/22: Pt reports he feels comfortable with his HEP Goal status: MET   LONG TERM GOALS: Target date: 12/19/2022   1.  Patient  will complete five times sit to stand test in < 10 seconds indicating an increased LE strength and improved balance. Baseline: 13.07; 10/31/22 10.4 seconds hands-free; 12/11/22: 9.45 sec  Goal status:  MET   2.  Patient will increase FOTO score to equal to or greater than  75   to  demonstrate statistically significant improvement in mobility and quality of life.  Baseline: 70; 10/31/2022: 71, 12/11/22: 82 Goal status: MET   3.  Patient will increase Mini best Balance score by > 4 points to demonstrate decreased fall risk during functional activities. Baseline: 19; 10/31/22: 23/28; 12/11/22: 25/28 Goal status: MET   4.  Patient will reduce dual task timed up and go  to <11 seconds to reduce fall risk and demonstrate improved transfer/gait ability. Baseline: 12.97l 10/31/22: 8 sec Goal status: MET  5.  Patient will increase six minute walk test distance to >1000 for progression to community ambulator and improve gait ability Baseline: Test visit 2 09/28/2022: 1414 ft; 10/31/2022: 1628 ft (previously met) Goal status: REVISED/MET 09/28/2022  6. Pt will improve modified oswestry score by 5 points or greater in order to indicate improved function in relation to his back pain   Baseline: 10/50; 11/01/22: 60%   Goal status: MET 7. Patient will be able to put his pants on without use of furniture to lean on.   Baseline: unable to hold R leg up > 80 deg hip flexion for longer than 5 seconds without UE support   Goal status: Initial     ASSESSMENT:  CLINICAL IMPRESSION:    Patient has intermittent R knee soreness throughout session requiring modification and monitoring. Patient to return to [facility] next week. Patient has improved arm swing s/p ambulation with PVC pipe, L arm swing continues to require external cues. Trunk rotation is challenging for patient  Pt will continue to benefit from skilled therapy to address remaining deficits in order to improve overall QoL and return to PLOF.     OBJECTIVE IMPAIRMENTS: Abnormal gait, decreased balance, decreased endurance, decreased mobility, difficulty walking, and hypomobility.   ACTIVITY LIMITATIONS: carrying, lifting, bending, standing, squatting, stairs, and locomotion level  PARTICIPATION LIMITATIONS: community  activity  PERSONAL FACTORS: Age and 1-2 comorbidities: Asthma  are also affecting patient's functional outcome.   REHAB POTENTIAL: Good  CLINICAL DECISION MAKING: Stable/uncomplicated  EVALUATION COMPLEXITY: Low  PLAN:  PT FREQUENCY: 2x/week  PT DURATION: 12 weeks  PLANNED INTERVENTIONS: Therapeutic exercises, Therapeutic activity, Neuromuscular re-education, Balance training, Gait training, Patient/Family education, Self Care, Joint mobilization, Joint manipulation, Stair training, Dry Needling, Spinal mobilization, Cryotherapy, Moist heat, and Manual therapy  PLAN FOR NEXT SESSION:   balance, low back pain, and PD specific impairments, emphasis on large arm swing, work on L SL stance with R hip flexion > 80 deg ROM with less UE dependence, continue plan    Precious Bard PT   Physical Therapist - [facility]  [facility]  02/12/23, 10:16 AM

## 2023-02-13 NOTE — Therapy (Signed)
OUTPATIENT PHYSICAL THERAPY NEURO TREATMENT NOTE   Patient Name: Daniel [name] MRN: [id number] DOB:07-16-1950, 72 y.o., male Today's Date: 02/14/2023   PCP:    Denzil Magnuson   REFERRING PROVIDER: Lonell Face, MD   END OF SESSION:  PT End of Session - 02/14/23 1341     Visit Number 28    Number of Visits 46    Date for PT Re-Evaluation 03/15/23    Authorization Type [employer] MEDICARE reporting period from 06/19/2022    Progress Note Due on Visit 10    PT Start Time 1400    PT Stop Time 1430    PT Time Calculation (min) 30 min    Equipment Utilized During Treatment Gait belt    Activity Tolerance Patient tolerated treatment well;Patient limited by fatigue    Behavior During Therapy [facility] for tasks assessed/performed                   Past Medical History:  Diagnosis Date   Actinic keratosis    Asthma    Eczema    Hx of dysplastic nevus 06/06/2006   Mid lower back, slight atypia   Hypothyroidism    Seasonal allergies    Thyroid disease    Past Surgical History:  Procedure Laterality Date   CATARACT EXTRACTION     COLON SURGERY     COLONOSCOPY WITH PROPOFOL N/A 11/18/2014   Procedure: COLONOSCOPY WITH PROPOFOL;  Surgeon: Scot Jun, MD;  Location: [facility] ENDOSCOPY;  Service: Endoscopy;  Laterality: N/A;   COLONOSCOPY WITH PROPOFOL N/A 12/13/2021   Procedure: COLONOSCOPY WITH PROPOFOL;  Surgeon: Regis Bill, MD;  Location: [facility] ENDOSCOPY;  Service: Endoscopy;  Laterality: N/A;   EYE SURGERY     KNEE SURGERY     NASAL SEPTUM SURGERY     Patient Active Problem List   Diagnosis Date Noted   COVID-19 01/03/2023   Parkinson's disease 09/26/2022   Chronic idiopathic constipation 06/05/2022   Left lower quadrant abdominal pain 06/05/2022   Paroxysmal atrial fibrillation (HCC) 03/28/2022   Idiopathic hypotension 03/28/2022   Spondylolysis, lumbar region 03/28/2022   Avitaminosis D 03/28/2022   Myalgia due to statin 03/28/2022   Acquired  thrombophilia (HCC) 03/28/2022   Hyperlipidemia 03/28/2022   Abnormal gait 03/28/2022   Mild intermittent asthma without complication 03/28/2022   Zenker's (hypopharyngeal) diverticulum 03/22/2018   Gastroesophageal reflux disease 03/22/2018   Fatty liver 10/09/2017   BPH (benign prostatic hyperplasia) 11/21/2014   Hypothyroidism 11/21/2014   Inguinal hernia 11/21/2014   OA (osteoarthritis) 11/21/2014   H/O adenomatous polyp of colon 10/14/2014    ONSET DATE: 04/28/2022 for PD, back pain began around 09/10/21   REFERRING DIAG:  G20.A1 (ICD-10-CM) - Parkinson's disease  M54.9 (ICD-10-CM) - Back pain    THERAPY DIAG:  Muscle weakness (generalized)  Difficulty in walking, not elsewhere classified  Unsteadiness on feet  Other abnormalities of gait and mobility  Rationale for Evaluation and Treatment: Rehabilitation  SUBJECTIVE:  SUBJECTIVE STATEMENT:   Patient has to leave early for an appointment.    Pt accompanied by: self  PERTINENT HISTORY: Per [name] PT from 09/07/22 PT discharge from Low back treatment for focussed on PD related impairments at our clinic "Patient has attended 40 physical therapy treatment sessions since starting current episode of care on 12/20/2021. Patient at first struggled to make progress towards goals and has struggled at times with consistent attendance. However, over the past few weeks he has been improving with multi-discipline interventions including medications and increasing overall activity. It was also recently confirmed he has Parkinson's that is likely contributing to some of his abnormal posture and difficulty with full ROM during movements. Patient is being discharged today due to improvement in condition and so he can start PT with focus on  rehabilitation techniques for Parkinson's. Patient was provided with a long term HEP to help him with long term management of low back pain."  PAIN:  Are you having pain? Yes: NPRS scale: 3/10 Pain location: R knee  Pain description: sharp  Aggravating factors: when flexing the knee in weight bearing  Relieving factors: PT exercises, movement   PRECAUTIONS: None  WEIGHT BEARING RESTRICTIONS: No  FALLS: Has patient fallen in last 6 months? No  LIVING ENVIRONMENT: Lives with: lives alone Lives in: House/apartment Stairs: Yes: External: 5 steps; on left going up Has following equipment at home: None  PLOF: Independent  PATIENT GOALS: Improve his low back pain, improve posture, improve walking.   OBJECTIVE:   DIAGNOSTIC FINDINGS: MRI of L spine 07/12/21: Degenerative disc disease throughout the lumbar region with loss of disc height. No apparent compressive stenosis of the canal. Discogenic endplate marrow changes without active edema at L2-3, L3-4 and L4-5, which could correlate with regional axial back pain.   Left lateral recess and foraminal narrowing at L2-3, left foraminal narrowing at L3-4, and bilateral lateral recess and foraminal narrowing at L4-5. None of the stenoses are severe or are seen to result in definite neural compression.   Shallow left posterolateral disc herniation at L1-2 with caudal migration of a tiny amount of disc material to the left of midline. This does not appear to cause any significant compressive effect upon the neural structures.  COGNITION: Overall cognitive status: Within functional limits for tasks assessed   SENSATION: WFL   POSTURE: rounded shoulders, forward head, and increased thoracic kyphosis  LOWER EXTREMITY ROM:   WNL LOWER EXTREMITY MMT:    MMT Right Eval Left Eval  Hip flexion 4+ 4+  Hip extension    Hip abduction 5 5  Hip adduction 5 5  Hip internal rotation    Hip external rotation    Knee flexion 5 5  Knee  extension 5 5  Ankle dorsiflexion 5 5  Ankle plantarflexion 5 5  Ankle inversion    Ankle eversion    (Blank rows = not tested)  BED MOBILITY: WNL    STAIRS: Level of Assistance: Complete Independence Stair Negotiation Technique: Alternating Pattern  with Bilateral Rails Number of Stairs: 4  Height of Stairs: 6    GAIT: Gait pattern:  forward posture and short step length bilateral Distance walked: 30 ft Assistive device utilized: None Level of assistance: Complete Independence   FUNCTIONAL TESTS:  5 times sit to stand: 13.07 6 minute walk test: 1414 ft  Mini Best Test:   PATIENT SURVEYS:  Modified Oswestry 10/50  FOTO 70, risk adjusted goal of 75     TODAY'S TREATMENT: DATE:  02/14/23  Unless otherwise stated, CGA was provided and gait belt donned in order to ensure pt safety     NMR:   Performed on blue mat for slight increase in balance challenge: 2.5# wrist weights donned  --Large amplitude standing LTL step and reach 2x10 each side. Rates medium --Large amplitude twist in standing with BUE shoulder abduction 2x10 each direction. Rates easy   Activity Description: pods in a circle around patient, stand on one leg and tap SUE support required  Activity Setting:  The Blaze Pod Random setting was chosen to enhance cognitive processing and agility, providing an unpredictable environment to simulate real-world scenarios, and fostering quick reactions and adaptability.   Number of Pods:  6 Cycles/Sets:  3 Duration (Time or Hit Count):  30 seconds  Seated rotation twists for thoracic spine x 10 each side Tandem stance word game for dual task x 4 minutes with visual scan and reaching    PATIENT EDUCATION: Education details: Pt educated throughout session about proper posture and technique with exercises. Improved exercise technique, movement at target joints, use of target muscles after min to mod verbal, visual, tactile cues.   Person educated:  Patient Education method: Explanation, Demonstration, and Verbal cues Education comprehension: verbalized understanding, returned demonstration, and needs further education  HOME EXERCISE PROGRAM:Pt to continue HEP as listed below Seated PWR! Moves handout gone over and provided.  Standing PWR! Moves handout reviewed and provided.  All fours modified to chair UE support with PWR! moves handout reviewed and provided Access Code: [email or web address] URL: [email or web address] Date: 12/11/2022 Prepared by: Precious Bard Exercises - Straight Leg Raise with Arm Support  - 1 x daily - 7 x weekly - 2 sets - 10 reps - 5 hold - Single Leg  - 1 x daily - 7 x weekly - 2 sets - 2 reps - 30 hold    GOALS: Goals reviewed with patient? Yes  SHORT TERM GOALS: Target date: 10/24/2022    Patient will be independent in progressive home exercise program for PD to improve strength/mobility for better functional independence with ADLs. Baseline: No HEP for PD; 09/28/2022 per previous note pt currently perform PWR interventions as part of HEP; 10/31/22: Pt reports he feels comfortable with his HEP Goal status: MET   LONG TERM GOALS: Target date: 12/19/2022   1.  Patient  will complete five times sit to stand test in < 10 seconds indicating an increased LE strength and improved balance. Baseline: 13.07; 10/31/22 10.4 seconds hands-free; 12/11/22: 9.45 sec  Goal status:  MET   2.  Patient will increase FOTO score to equal to or greater than  75   to demonstrate statistically significant improvement in mobility and quality of life.  Baseline: 70; 10/31/2022: 71, 12/11/22: 82 Goal status: MET   3.  Patient will increase Mini best Balance score by > 4 points to demonstrate decreased fall risk during functional activities. Baseline: 19; 10/31/22: 23/28; 12/11/22: 25/28 Goal status: MET   4.  Patient will reduce dual task timed up and go to <11 seconds to reduce fall risk and demonstrate improved transfer/gait  ability. Baseline: 12.97l 10/31/22: 8 sec Goal status: MET  5.  Patient will increase six minute walk test distance to >1000 for progression to community ambulator and improve gait ability Baseline: Test visit 2 09/28/2022: 1414 ft; 10/31/2022: 1628 ft (previously met) Goal status: REVISED/MET 09/28/2022  6. Pt will improve modified oswestry score by 5 points or greater in order to indicate improved function in  relation to his back pain   Baseline: 10/50; 11/01/22: 60%   Goal status: MET 7. Patient will be able to put his pants on without use of furniture to lean on.   Baseline: unable to hold R leg up > 80 deg hip flexion for longer than 5 seconds without UE support   Goal status: Initial     ASSESSMENT:  CLINICAL IMPRESSION:   Patient session ended early due to patient needing to leave. Patient's knee pain is present but not as involved this session due to patient wearing brace. Patient is very limited in single limb stance indicating need for continued focus.   Pt will continue to benefit from skilled therapy to address remaining deficits in order to improve overall QoL and return to PLOF.     OBJECTIVE IMPAIRMENTS: Abnormal gait, decreased balance, decreased endurance, decreased mobility, difficulty walking, and hypomobility.   ACTIVITY LIMITATIONS: carrying, lifting, bending, standing, squatting, stairs, and locomotion level  PARTICIPATION LIMITATIONS: community activity  PERSONAL FACTORS: Age and 1-2 comorbidities: Asthma  are also affecting patient's functional outcome.   REHAB POTENTIAL: Good  CLINICAL DECISION MAKING: Stable/uncomplicated  EVALUATION COMPLEXITY: Low  PLAN:  PT FREQUENCY: 2x/week  PT DURATION: 12 weeks  PLANNED INTERVENTIONS: Therapeutic exercises, Therapeutic activity, Neuromuscular re-education, Balance training, Gait training, Patient/Family education, Self Care, Joint mobilization, Joint manipulation, Stair training, Dry Needling, Spinal mobilization,  Cryotherapy, Moist heat, and Manual therapy  PLAN FOR NEXT SESSION:   balance, low back pain, and PD specific impairments, emphasis on large arm swing, work on L SL stance with R hip flexion > 80 deg ROM with less UE dependence, continue plan    Precious Bard PT   Physical Therapist - [facility]  [facility]  02/14/23, 2:35 PM

## 2023-02-14 ENCOUNTER — Ambulatory Visit: Payer: Medicare Other

## 2023-02-14 DIAGNOSIS — M6281 Muscle weakness (generalized): Secondary | ICD-10-CM | POA: Diagnosis not present

## 2023-02-14 DIAGNOSIS — R2681 Unsteadiness on feet: Secondary | ICD-10-CM | POA: Diagnosis not present

## 2023-02-14 DIAGNOSIS — R262 Difficulty in walking, not elsewhere classified: Secondary | ICD-10-CM

## 2023-02-14 DIAGNOSIS — R2689 Other abnormalities of gait and mobility: Secondary | ICD-10-CM | POA: Diagnosis not present

## 2023-02-20 ENCOUNTER — Telehealth: Payer: Self-pay | Admitting: Family Medicine

## 2023-02-20 ENCOUNTER — Other Ambulatory Visit: Payer: Self-pay

## 2023-02-20 DIAGNOSIS — K219 Gastro-esophageal reflux disease without esophagitis: Secondary | ICD-10-CM

## 2023-02-20 MED ORDER — PANTOPRAZOLE SODIUM 40 MG PO TBEC: 40.0000 mg | DELAYED_RELEASE_TABLET | Freq: Every day | ORAL | 3 refills | Status: DC

## 2023-02-20 NOTE — Telephone Encounter (Signed)
[facility] pharmacy is requesting prescription refill pantoprazole (PROTONIX) 40 MG tablet  Please advise

## 2023-02-21 ENCOUNTER — Ambulatory Visit: Payer: Medicare Other

## 2023-02-23 ENCOUNTER — Other Ambulatory Visit: Payer: Self-pay | Admitting: Family Medicine

## 2023-02-23 NOTE — Telephone Encounter (Signed)
Requested Prescriptions  Pending Prescriptions Disp Refills   levothyroxine (SYNTHROID) 100 MCG tablet [Pharmacy Med Name: LEVOTHYROXINE 0.100MG  ( ) TAB] 90 tablet 1    Sig: TAKE 1 TABLET BY MOUTH EVERY DAY BEFORE BREAKFAST     Endocrinology:  Hypothyroid Agents Passed - 02/23/2023 10:21 AM      Passed - TSH in normal range and within 360 days    TSH  Date Value Ref Range Status  06/15/2022 1.12 0.41 - 5.90 Final  03/28/2022 1.180 0.450 - 4.500 uIU/mL Final         Passed - Valid encounter within last 12 months    Recent Outpatient Visits           1 month ago Cough, unspecified type   [place] [facility] [place], [place], PA-C   5 months ago Encounter for annual wellness visit (AWV) in Medicare patient   [place] [facility] [place], Marzella Schlein, MD   8 months ago Left lower quadrant abdominal pain   [place] [facility] [place], Marzella Schlein, MD   11 months ago Acquired hypothyroidism   [place] [facility] [place], Marzella Schlein, MD   1 year ago Idiopathic hypotension    [facility] Bosie Clos, MD       Future Appointments             In 1 month Bacigalupo, Marzella Schlein, MD [facility]   In 2 months Deirdre Evener, MD [facility]   In 4 months Stoioff, Verna Czech, MD [facility] [place]

## 2023-02-27 ENCOUNTER — Ambulatory Visit: Payer: Medicare Other

## 2023-02-27 DIAGNOSIS — Z961 Presence of intraocular lens: Secondary | ICD-10-CM | POA: Diagnosis not present

## 2023-03-01 ENCOUNTER — Ambulatory Visit: Payer: Medicare Other | Attending: Neurology

## 2023-03-01 DIAGNOSIS — G8929 Other chronic pain: Secondary | ICD-10-CM | POA: Insufficient documentation

## 2023-03-01 DIAGNOSIS — R262 Difficulty in walking, not elsewhere classified: Secondary | ICD-10-CM | POA: Insufficient documentation

## 2023-03-01 DIAGNOSIS — M546 Pain in thoracic spine: Secondary | ICD-10-CM | POA: Diagnosis not present

## 2023-03-01 DIAGNOSIS — M5459 Other low back pain: Secondary | ICD-10-CM | POA: Insufficient documentation

## 2023-03-01 DIAGNOSIS — R2681 Unsteadiness on feet: Secondary | ICD-10-CM | POA: Insufficient documentation

## 2023-03-01 DIAGNOSIS — R293 Abnormal posture: Secondary | ICD-10-CM | POA: Diagnosis not present

## 2023-03-01 DIAGNOSIS — M545 Low back pain, unspecified: Secondary | ICD-10-CM | POA: Insufficient documentation

## 2023-03-01 DIAGNOSIS — R2689 Other abnormalities of gait and mobility: Secondary | ICD-10-CM | POA: Insufficient documentation

## 2023-03-01 NOTE — Therapy (Signed)
OUTPATIENT PHYSICAL THERAPY NEURO TREATMENT NOTE   Patient Name: Daniel [name] MRN: [id number] DOB:1951/04/23, 72 y.o., male Today's Date: 03/01/2023   PCP:    Denzil Magnuson   REFERRING PROVIDER: Lonell Face, MD   END OF SESSION:    Past Medical History:  Diagnosis Date   Actinic keratosis    Asthma    Eczema    Hx of dysplastic nevus 06/06/2006   Mid lower back, slight atypia   Hypothyroidism    Seasonal allergies    Thyroid disease    Past Surgical History:  Procedure Laterality Date   CATARACT EXTRACTION     COLON SURGERY     COLONOSCOPY WITH PROPOFOL N/A 11/18/2014   Procedure: COLONOSCOPY WITH PROPOFOL;  Surgeon: Scot Jun, MD;  Location: [facility] ENDOSCOPY;  Service: Endoscopy;  Laterality: N/A;   COLONOSCOPY WITH PROPOFOL N/A 12/13/2021   Procedure: COLONOSCOPY WITH PROPOFOL;  Surgeon: Regis Bill, MD;  Location: [facility] ENDOSCOPY;  Service: Endoscopy;  Laterality: N/A;   EYE SURGERY     KNEE SURGERY     NASAL SEPTUM SURGERY     Patient Active Problem List   Diagnosis Date Noted   COVID-19 01/03/2023   Parkinson's disease (HCC) 09/26/2022   Chronic idiopathic constipation 06/05/2022   Left lower quadrant abdominal pain 06/05/2022   Paroxysmal atrial fibrillation (HCC) 03/28/2022   Idiopathic hypotension 03/28/2022   Spondylolysis, lumbar region 03/28/2022   Avitaminosis D 03/28/2022   Myalgia due to statin 03/28/2022   Acquired thrombophilia (HCC) 03/28/2022   Hyperlipidemia 03/28/2022   Abnormal gait 03/28/2022   Mild intermittent asthma without complication 03/28/2022   Zenker's (hypopharyngeal) diverticulum 03/22/2018   Gastroesophageal reflux disease 03/22/2018   Fatty liver 10/09/2017   BPH (benign prostatic hyperplasia) 11/21/2014   Hypothyroidism 11/21/2014   Inguinal hernia 11/21/2014   OA (osteoarthritis) 11/21/2014   H/O adenomatous polyp of colon 10/14/2014    ONSET DATE: 04/28/2022 for PD, back pain began around  09/10/21   REFERRING DIAG:  G20.A1 (ICD-10-CM) - Parkinson's disease  M54.9 (ICD-10-CM) - Back pain    THERAPY DIAG:  No diagnosis found.  Rationale for Evaluation and Treatment: Rehabilitation  SUBJECTIVE:                                                                                                                                                                                             SUBJECTIVE STATEMENT:    Pt reports no pain upon arrival, but notes that he is tired currently.  Pt denies any falls as well.  Pt does report having a catch in his R hip, however  it is getting better.    Pt accompanied by: self  PERTINENT HISTORY: Per [name] PT from 09/07/22 PT discharge from Low back treatment for focussed on PD related impairments at our clinic "Patient has attended 40 physical therapy treatment sessions since starting current episode of care on 12/20/2021. Patient at first struggled to make progress towards goals and has struggled at times with consistent attendance. However, over the past few weeks he has been improving with multi-discipline interventions including medications and increasing overall activity. It was also recently confirmed he has Parkinson's that is likely contributing to some of his abnormal posture and difficulty with full ROM during movements. Patient is being discharged today due to improvement in condition and so he can start PT with focus on rehabilitation techniques for Parkinson's. Patient was provided with a long term HEP to help him with long term management of low back pain."  PAIN:  Are you having pain? Yes: NPRS scale: 3/10 Pain location: R knee  Pain description: sharp  Aggravating factors: when flexing the knee in weight bearing  Relieving factors: PT exercises, movement   PRECAUTIONS: None  WEIGHT BEARING RESTRICTIONS: No  FALLS: Has patient fallen in last 6 months? No  LIVING ENVIRONMENT: Lives with: lives alone Lives in:  House/apartment Stairs: Yes: External: 5 steps; on left going up Has following equipment at home: None  PLOF: Independent  PATIENT GOALS: Improve his low back pain, improve posture, improve walking.   OBJECTIVE:   DIAGNOSTIC FINDINGS: MRI of L spine 07/12/21: Degenerative disc disease throughout the lumbar region with loss of disc height. No apparent compressive stenosis of the canal. Discogenic endplate marrow changes without active edema at L2-3, L3-4 and L4-5, which could correlate with regional axial back pain.   Left lateral recess and foraminal narrowing at L2-3, left foraminal narrowing at L3-4, and bilateral lateral recess and foraminal narrowing at L4-5. None of the stenoses are severe or are seen to result in definite neural compression.   Shallow left posterolateral disc herniation at L1-2 with caudal migration of a tiny amount of disc material to the left of midline. This does not appear to cause any significant compressive effect upon the neural structures.  COGNITION: Overall cognitive status: Within functional limits for tasks assessed   SENSATION: WFL   POSTURE: rounded shoulders, forward head, and increased thoracic kyphosis  LOWER EXTREMITY ROM:   WNL LOWER EXTREMITY MMT:    MMT Right Eval Left Eval  Hip flexion 4+ 4+  Hip extension    Hip abduction 5 5  Hip adduction 5 5  Hip internal rotation    Hip external rotation    Knee flexion 5 5  Knee extension 5 5  Ankle dorsiflexion 5 5  Ankle plantarflexion 5 5  Ankle inversion    Ankle eversion    (Blank rows = not tested)  BED MOBILITY: WNL   STAIRS: Level of Assistance: Complete Independence Stair Negotiation Technique: Alternating Pattern  with Bilateral Rails Number of Stairs: 4  Height of Stairs: 6   GAIT: Gait pattern:  forward posture and short step length bilateral Distance walked: 30 ft Assistive device utilized: None Level of assistance: Complete Independence   FUNCTIONAL  TESTS:  5 times sit to stand: 13.07 6 minute walk test: 1414 ft  Mini Best Test:   PATIENT SURVEYS:  Modified Oswestry 10/50  FOTO 70, risk adjusted goal of 75     TODAY'S TREATMENT: DATE: 03/01/23  Unless otherwise stated, CGA was provided and gait  belt donned in order to ensure pt safety   NMR:   Performed on blue mat for slight increase in balance challenge: 2.5# wrist weights donned  Large amplitude LTL stepping and arms outstretched and reach 2x10 each side.  Mini squats on airex balance beam, 2x10 for balance training Large amplitude twist in standing with BUE shoulder abduction 2x10 each direction.   BlazePod Activity #1: Activity Description: pods in a semi-circle around patient, no UE support Activity Setting:  The Blaze Pod Random setting was chosen to enhance cognitive processing and agility, providing an unpredictable environment to simulate real-world scenarios, and fostering quick reactions and adaptability.   Number of Pods:  6 Cycles/Sets:  3 Duration (Time or Hit Count):  30 seconds  BlazePod Activity #2: Activity Description: pods in a semi-circle around patient, no UE support; Red Light = R LE, Blue Light = L LE Activity Setting:  The Blaze Pod Random setting was chosen to enhance cognitive processing and agility, providing an unpredictable environment to simulate real-world scenarios, and fostering quick reactions and adaptability.   Number of Pods:  6 Cycles/Sets:  3 Duration (Time or Hit Count):  30 seconds   Korebalance Tux Racer, 3 total race attempts, stability level 4 Diplomatic Services operational officer mode Bronze set, stability level 4     PATIENT EDUCATION: Education details: Pt educated throughout session about proper posture and technique with exercises. Improved exercise technique, movement at target joints, use of target muscles after min to mod verbal, visual, tactile cues.   Person educated: Patient Education method: Explanation,  Demonstration, and Verbal cues Education comprehension: verbalized understanding, returned demonstration, and needs further education  HOME EXERCISE PROGRAM:Pt to continue HEP as listed below Seated PWR! Moves handout gone over and provided.  Standing PWR! Moves handout reviewed and provided.  All fours modified to chair UE support with PWR! moves handout reviewed and provided Access Code: [email or web address] URL: [email or web address] Date: 12/11/2022 Prepared by: Precious Bard Exercises - Straight Leg Raise with Arm Support  - 1 x daily - 7 x weekly - 2 sets - 10 reps - 5 hold - Single Leg  - 1 x daily - 7 x weekly - 2 sets - 2 reps - 30 hold    GOALS: Goals reviewed with patient? Yes  SHORT TERM GOALS: Target date: 10/24/2022    Patient will be independent in progressive home exercise program for PD to improve strength/mobility for better functional independence with ADLs. Baseline: No HEP for PD; 09/28/2022 per previous note pt currently perform PWR interventions as part of HEP; 10/31/22: Pt reports he feels comfortable with his HEP Goal status: MET   LONG TERM GOALS: Target date: 12/19/2022   1.  Patient  will complete five times sit to stand test in < 10 seconds indicating an increased LE strength and improved balance. Baseline: 13.07; 10/31/22 10.4 seconds hands-free; 12/11/22: 9.45 sec  Goal status:  MET   2.  Patient will increase FOTO score to equal to or greater than  75   to demonstrate statistically significant improvement in mobility and quality of life.  Baseline: 70; 10/31/2022: 71, 12/11/22: 82 Goal status: MET   3.  Patient will increase Mini best Balance score by > 4 points to demonstrate decreased fall risk during functional activities. Baseline: 19; 10/31/22: 23/28; 12/11/22: 25/28 Goal status: MET   4.  Patient will reduce dual task timed up and go to <11 seconds to reduce fall risk and demonstrate improved transfer/gait ability. Baseline: 12.97l 10/31/22: 8  sec Goal  status: MET  5.  Patient will increase six minute walk test distance to >1000 for progression to community ambulator and improve gait ability Baseline: Test visit 2 09/28/2022: 1414 ft; 10/31/2022: 1628 ft (previously met) Goal status: REVISED/MET 09/28/2022  6. Pt will improve modified oswestry score by 5 points or greater in order to indicate improved function in relation to his back pain  Baseline: 10/50; 11/01/22: 60%  Goal status: MET  7. Patient will be able to put his pants on without use of furniture to lean on.  Baseline: unable to hold R leg up > 80 deg hip flexion for longer than 5 seconds without UE support  Goal status: Initial     ASSESSMENT:  CLINICAL IMPRESSION:    Pt continues to respond well to more dynamic balance challenges and things that require single leg stance.  Pt enjoys the blaze pod activity due to it challenging his ability to stand on one leg.  Will continue to incorporate more dynamic mobility requirements necessary for improving balance, specifically with single leg balance.   Pt will continue to benefit from skilled therapy to address remaining deficits in order to improve overall QoL and return to PLOF.      OBJECTIVE IMPAIRMENTS: Abnormal gait, decreased balance, decreased endurance, decreased mobility, difficulty walking, and hypomobility.   ACTIVITY LIMITATIONS: carrying, lifting, bending, standing, squatting, stairs, and locomotion level  PARTICIPATION LIMITATIONS: community activity  PERSONAL FACTORS: Age and 1-2 comorbidities: Asthma  are also affecting patient's functional outcome.   REHAB POTENTIAL: Good  CLINICAL DECISION MAKING: Stable/uncomplicated  EVALUATION COMPLEXITY: Low  PLAN:  PT FREQUENCY: 2x/week  PT DURATION: 12 weeks  PLANNED INTERVENTIONS: Therapeutic exercises, Therapeutic activity, Neuromuscular re-education, Balance training, Gait training, Patient/Family education, Self Care, Joint mobilization, Joint manipulation, Stair  training, Dry Needling, Spinal mobilization, Cryotherapy, Moist heat, and Manual therapy  PLAN FOR NEXT SESSION:   balance, low back pain, and PD specific impairments, emphasis on large arm swing, work on L SL stance with R hip flexion > 80 deg ROM with less UE dependence, continue plan     Nolon Bussing, PT, DPT Physical Therapist - [facility]  [facility]  03/01/23, 8:20 AM

## 2023-03-02 ENCOUNTER — Ambulatory Visit: Payer: Medicare Other | Admitting: Podiatry

## 2023-03-02 ENCOUNTER — Encounter: Payer: Self-pay | Admitting: Podiatry

## 2023-03-02 VITALS — Ht 74.0 in | Wt 211.0 lb

## 2023-03-02 DIAGNOSIS — L03031 Cellulitis of right toe: Secondary | ICD-10-CM | POA: Diagnosis not present

## 2023-03-02 DIAGNOSIS — L600 Ingrowing nail: Secondary | ICD-10-CM

## 2023-03-02 NOTE — Progress Notes (Signed)
   Chief Complaint  Patient presents with   Wound Check    Patient is here for F/U for post permanent nail avulsion procedure of the medial border of the right great toe that was performed on 01/16/2023.    Subjective: 72 y.o. male presents today status post permanent nail avulsion procedure of the medial border of the right great toe that was performed on 01/16/2023.  Patient states that he is feeling significantly better.  His pain and symptoms with the redness and drainage had resolved over the following 3 days after his last visit.  No new complaints  Past Medical History:  Diagnosis Date   Actinic keratosis    Asthma    Eczema    Hx of dysplastic nevus 06/06/2006   Mid lower back, slight atypia   Hypothyroidism    Seasonal allergies    Thyroid disease     Objective: Neurovascular status intact.  The nail matricectomy site appears completely resolved and healed.  There is no erythema or drainage around the area.  No tenderness.  There is some slight callus tissue to the area.  Assessment: #1 s/p partial permanent nail matrixectomy medial border right great toe.  01/16/2023 #2 possible residual tissue within the avulsion site right great toe; resolved   Plan of care: -Patient was evaluated  - Light debridement of the nail matricectomy site was performed today with a tissue nipper. -The area appears completely resolved and healing appropriately -Return to clinic as needed   Felecia Shelling, DPM [facility]  Dr. Felecia Shelling, DPM    [address] [place], [place] [address]                Office [phone]  Fax [phone]

## 2023-03-05 ENCOUNTER — Ambulatory Visit: Payer: Medicare Other | Admitting: Physical Therapy

## 2023-03-05 DIAGNOSIS — M545 Low back pain, unspecified: Secondary | ICD-10-CM

## 2023-03-05 DIAGNOSIS — R2681 Unsteadiness on feet: Secondary | ICD-10-CM

## 2023-03-05 DIAGNOSIS — R2689 Other abnormalities of gait and mobility: Secondary | ICD-10-CM

## 2023-03-05 DIAGNOSIS — R293 Abnormal posture: Secondary | ICD-10-CM

## 2023-03-05 DIAGNOSIS — M546 Pain in thoracic spine: Secondary | ICD-10-CM | POA: Diagnosis not present

## 2023-03-05 DIAGNOSIS — R262 Difficulty in walking, not elsewhere classified: Secondary | ICD-10-CM | POA: Diagnosis not present

## 2023-03-05 DIAGNOSIS — G8929 Other chronic pain: Secondary | ICD-10-CM | POA: Diagnosis not present

## 2023-03-05 DIAGNOSIS — M5459 Other low back pain: Secondary | ICD-10-CM | POA: Diagnosis not present

## 2023-03-05 NOTE — Therapy (Unsigned)
OUTPATIENT PHYSICAL THERAPY NEURO TREATMENT NOTE/ Physical Therapy Progress Note   Dates of reporting period  12/11/22   to   03/05/23     Patient Name: Daniel [name] MRN: [id number] DOB:14-Jan-1951, 72 y.o., male Today's Date: 03/05/2023   PCP:    Denzil Magnuson   REFERRING PROVIDER: Lonell Face, MD   END OF SESSION:  PT End of Session - 03/05/23 1534     Visit Number 30    Number of Visits 46    Date for PT Re-Evaluation 03/15/23    Authorization Type [employer] MEDICARE reporting period from 06/19/2022    Progress Note Due on Visit 10    PT Start Time 1531    PT Stop Time 1613    PT Time Calculation (min) 42 min    Equipment Utilized During Treatment Gait belt    Activity Tolerance Patient tolerated treatment well;Patient limited by fatigue    Behavior During Therapy [facility] for tasks assessed/performed              Past Medical History:  Diagnosis Date   Actinic keratosis    Asthma    Eczema    Hx of dysplastic nevus 06/06/2006   Mid lower back, slight atypia   Hypothyroidism    Seasonal allergies    Thyroid disease    Past Surgical History:  Procedure Laterality Date   CATARACT EXTRACTION     COLON SURGERY     COLONOSCOPY WITH PROPOFOL N/A 11/18/2014   Procedure: COLONOSCOPY WITH PROPOFOL;  Surgeon: Scot Jun, MD;  Location: [facility] ENDOSCOPY;  Service: Endoscopy;  Laterality: N/A;   COLONOSCOPY WITH PROPOFOL N/A 12/13/2021   Procedure: COLONOSCOPY WITH PROPOFOL;  Surgeon: Regis Bill, MD;  Location: [facility] ENDOSCOPY;  Service: Endoscopy;  Laterality: N/A;   EYE SURGERY     KNEE SURGERY     NASAL SEPTUM SURGERY     Patient Active Problem List   Diagnosis Date Noted   COVID-19 01/03/2023   Parkinson's disease (HCC) 09/26/2022   Chronic idiopathic constipation 06/05/2022   Left lower quadrant abdominal pain 06/05/2022   Paroxysmal atrial fibrillation (HCC) 03/28/2022   Idiopathic hypotension 03/28/2022   Spondylolysis, lumbar region  03/28/2022   Avitaminosis D 03/28/2022   Myalgia due to statin 03/28/2022   Acquired thrombophilia (HCC) 03/28/2022   Hyperlipidemia 03/28/2022   Abnormal gait 03/28/2022   Mild intermittent asthma without complication 03/28/2022   Zenker's (hypopharyngeal) diverticulum 03/22/2018   Gastroesophageal reflux disease 03/22/2018   Fatty liver 10/09/2017   BPH (benign prostatic hyperplasia) 11/21/2014   Hypothyroidism 11/21/2014   Inguinal hernia 11/21/2014   OA (osteoarthritis) 11/21/2014   H/O adenomatous polyp of colon 10/14/2014    ONSET DATE: 04/28/2022 for PD, back pain began around 09/10/21   REFERRING DIAG:  G20.A1 (ICD-10-CM) - Parkinson's disease  M54.9 (ICD-10-CM) - Back pain    THERAPY DIAG:  Abnormal posture  Difficulty in walking, not elsewhere classified  Chronic bilateral low back pain, unspecified whether sciatica present  Unsteadiness on feet  Other abnormalities of gait and mobility  Rationale for Evaluation and Treatment: Rehabilitation  SUBJECTIVE:  SUBJECTIVE STATEMENT:    Pt reports doing well today. Pt denies any recent falls/stumbles since prior session. Pt denies any updates to medications or medical appointment since prior session. Pt reports good compliance with HEP when time permits. Pt report ship has been feeling pretty good. Pt reports having COVID 5-6 weeks ago and feels he has not completely bounced back from that.   Pt accompanied by: self  PERTINENT HISTORY: Per [name] PT from 09/07/22 PT discharge from Low back treatment for focussed on PD related impairments at our clinic "Patient has attended 40 physical therapy treatment sessions since starting current episode of care on 12/20/2021. Patient at first struggled to make progress towards goals and has  struggled at times with consistent attendance. However, over the past few weeks he has been improving with multi-discipline interventions including medications and increasing overall activity. It was also recently confirmed he has Parkinson's that is likely contributing to some of his abnormal posture and difficulty with full ROM during movements. Patient is being discharged today due to improvement in condition and so he can start PT with focus on rehabilitation techniques for Parkinson's. Patient was provided with a long term HEP to help him with long term management of low back pain."  PAIN:  Are you having pain? Yes: NPRS scale: 3/10 Pain location: R knee  Pain description: sharp  Aggravating factors: when flexing the knee in weight bearing  Relieving factors: PT exercises, movement   PRECAUTIONS: None  WEIGHT BEARING RESTRICTIONS: No  FALLS: Has patient fallen in last 6 months? No  LIVING ENVIRONMENT: Lives with: lives alone Lives in: House/apartment Stairs: Yes: External: 5 steps; on left going up Has following equipment at home: None  PLOF: Independent  PATIENT GOALS: Improve his low back pain, improve posture, improve walking.   OBJECTIVE:   DIAGNOSTIC FINDINGS: MRI of L spine 07/12/21: Degenerative disc disease throughout the lumbar region with loss of disc height. No apparent compressive stenosis of the canal. Discogenic endplate marrow changes without active edema at L2-3, L3-4 and L4-5, which could correlate with regional axial back pain.   Left lateral recess and foraminal narrowing at L2-3, left foraminal narrowing at L3-4, and bilateral lateral recess and foraminal narrowing at L4-5. None of the stenoses are severe or are seen to result in definite neural compression.   Shallow left posterolateral disc herniation at L1-2 with caudal migration of a tiny amount of disc material to the left of midline. This does not appear to cause any significant compressive  effect upon the neural structures.  COGNITION: Overall cognitive status: Within functional limits for tasks assessed   SENSATION: WFL   POSTURE: rounded shoulders, forward head, and increased thoracic kyphosis  LOWER EXTREMITY ROM:   WNL LOWER EXTREMITY MMT:    MMT Right Eval Left Eval  Hip flexion 4+ 4+  Hip extension    Hip abduction 5 5  Hip adduction 5 5  Hip internal rotation    Hip external rotation    Knee flexion 5 5  Knee extension 5 5  Ankle dorsiflexion 5 5  Ankle plantarflexion 5 5  Ankle inversion    Ankle eversion    (Blank rows = not tested)  BED MOBILITY: WNL   STAIRS: Level of Assistance: Complete Independence Stair Negotiation Technique: Alternating Pattern  with Bilateral Rails Number of Stairs: 4  Height of Stairs: 6   GAIT: Gait pattern:  forward posture and short step length bilateral Distance walked: 30 ft Assistive device utilized: None  Level of assistance: Complete Independence   FUNCTIONAL TESTS:  5 times sit to stand: 13.07 6 minute walk test: 1414 ft  Mini Best Test:   PATIENT SURVEYS:  Modified Oswestry 10/50  FOTO 70, risk adjusted goal of 75     TODAY'S TREATMENT: DATE: 03/05/23  Unless otherwise stated, CGA was provided and gait belt donned in order to ensure pt safety   NMR:   Octane fitness level 3 x 6 min for B UE and LE reciprocal movements  Physical therapy treatment session today consisted of completing assessment of goals and administration of testing as demonstrated and documented in flow sheet, treatment, and goals section of this note. Addition treatments may be found below.   Rows on with 17.5# on cable machine in standing, cues for posture 2 x 15 reps   TRX forward lunge with B shoulder extension ( for pec stretch and thoracic extension) 2 x 10 reps ea LE     PATIENT EDUCATION: Education details: Pt educated throughout session about proper posture and technique with exercises. Improved exercise  technique, movement at target joints, use of target muscles after min to mod verbal, visual, tactile cues.   Person educated: Patient Education method: Explanation, Demonstration, and Verbal cues Education comprehension: verbalized understanding, returned demonstration, and needs further education  HOME EXERCISE PROGRAM:Pt to continue HEP as listed below Seated PWR! Moves handout gone over and provided.  Standing PWR! Moves handout reviewed and provided.  All fours modified to chair UE support with PWR! moves handout reviewed and provided Access Code: [email or web address] URL: [email or web address] Date: 12/11/2022 Prepared by: Precious Bard Exercises - Straight Leg Raise with Arm Support  - 1 x daily - 7 x weekly - 2 sets - 10 reps - 5 hold - Single Leg  - 1 x daily - 7 x weekly - 2 sets - 2 reps - 30 hold    GOALS: Goals reviewed with patient? Yes  SHORT TERM GOALS: Target date: 10/24/2022    Patient will be independent in progressive home exercise program for PD to improve strength/mobility for better functional independence with ADLs. Baseline: No HEP for PD; 09/28/2022 per previous note pt currently perform PWR interventions as part of HEP; 10/31/22: Pt reports he feels comfortable with his HEP Goal status: MET   LONG TERM GOALS: Target date: 12/19/2022   1.  Patient  will complete five times sit to stand test in < 10 seconds indicating an increased LE strength and improved balance. Baseline: 13.07; 10/31/22 10.4 seconds hands-free; 12/11/22: 9.45 sec  Goal status:  MET   2.  Patient will increase FOTO score to equal to or greater than  75   to demonstrate statistically significant improvement in mobility and quality of life.  Baseline: 70; 10/31/2022: 71, 12/11/22: 82 Goal status: MET   3.  Patient will increase Mini best Balance score by > 4 points to demonstrate decreased fall risk during functional activities. Baseline: 19; 10/31/22: 23/28; 12/11/22: 25/28 Goal status: MET   4.   Patient will reduce dual task timed up and go to <11 seconds to reduce fall risk and demonstrate improved transfer/gait ability. Baseline: 12.97l 10/31/22: 8 sec Goal status: MET  5.  Patient will increase six minute walk test distance to >1000 for progression to community ambulator and improve gait ability Baseline: Test visit 2 09/28/2022: 1414 ft; 10/31/2022: 1628 ft (previously met) 10/7:1385  Goal status: REVISED/MET 09/28/2022  6. Pt will improve modified oswestry score by 5 points or greater in order  to indicate improved function in relation to his back pain  Baseline: 10/50; 11/01/22: 60% 10/7: 30% Goal status: MET  7. Patient will be able to put his pants on without use of furniture to lean on.  Baseline: unable to hold R leg up > 80 deg hip flexion for longer than 5 seconds without UE support  03/05/23: Pt reports success with this task 6/10 times but hip is bothersome off and on.  Goal status: Initial     ASSESSMENT:  CLINICAL IMPRESSION:    Pt presents to PT for progress note this date. Pt has met nearly all of his goals with exception of his goal for putting on his pants withoutlimitations due to his R hip.  Patient does have some noted regression in his 6-minute walk test which she feels is secondary to recent COVID infection and he is still working on improving his endurance from this.  Patient's right hip mobility is improving but is not improving in a strictly linear fashion as some days he is able to complete ADLs without difficulty and other days he is limited per his report.  Discussed potential for discharge from physical therapy in the near future and tasked patient with coming up with any new or ongoing goals he would like to achieve from a functional standpoint. Patient's condition has the potential to improve in response to therapy. Maximum improvement is yet to be obtained. The anticipated improvement is attainable and reasonable in a generally predictable time.  Pt will  continue to benefit from skilled physical therapy intervention to address impairments, improve QOL, and attain therapy goals.      OBJECTIVE IMPAIRMENTS: Abnormal gait, decreased balance, decreased endurance, decreased mobility, difficulty walking, and hypomobility.   ACTIVITY LIMITATIONS: carrying, lifting, bending, standing, squatting, stairs, and locomotion level  PARTICIPATION LIMITATIONS: community activity  PERSONAL FACTORS: Age and 1-2 comorbidities: Asthma  are also affecting patient's functional outcome.   REHAB POTENTIAL: Good  CLINICAL DECISION MAKING: Stable/uncomplicated  EVALUATION COMPLEXITY: Low  PLAN:  PT FREQUENCY: 2x/week  PT DURATION: 12 weeks  PLANNED INTERVENTIONS: Therapeutic exercises, Therapeutic activity, Neuromuscular re-education, Balance training, Gait training, Patient/Family education, Self Care, Joint mobilization, Joint manipulation, Stair training, Dry Needling, Spinal mobilization, Cryotherapy, Moist heat, and Manual therapy  PLAN FOR NEXT SESSION:   balance, low back pain, and PD specific impairments, emphasis on large arm swing, work on L SL stance with R hip flexion > 80 deg ROM with less UE dependence, continue plan     Norman Herrlich PT ,DPT Physical Therapist- [facility]  [facility]   03/05/23, 3:35 PM

## 2023-03-06 ENCOUNTER — Encounter: Payer: Self-pay | Admitting: Physical Therapy

## 2023-03-07 ENCOUNTER — Ambulatory Visit: Payer: Medicare Other

## 2023-03-12 ENCOUNTER — Ambulatory Visit: Payer: Medicare Other

## 2023-03-13 NOTE — Therapy (Signed)
OUTPATIENT PHYSICAL THERAPY NEURO TREATMENT NOTE/RECERT     Patient Name: Daniel [name] MRN: [id number] DOB:1950-06-04, 72 y.o., male Today's Date: 03/13/2023   PCP:    Denzil Magnuson   REFERRING PROVIDER: Lonell Face, MD   END OF SESSION:     Past Medical History:  Diagnosis Date   Actinic keratosis    Asthma    Eczema    Hx of dysplastic nevus 06/06/2006   Mid lower back, slight atypia   Hypothyroidism    Seasonal allergies    Thyroid disease    Past Surgical History:  Procedure Laterality Date   CATARACT EXTRACTION     COLON SURGERY     COLONOSCOPY WITH PROPOFOL N/A 11/18/2014   Procedure: COLONOSCOPY WITH PROPOFOL;  Surgeon: Scot Jun, MD;  Location: [facility] ENDOSCOPY;  Service: Endoscopy;  Laterality: N/A;   COLONOSCOPY WITH PROPOFOL N/A 12/13/2021   Procedure: COLONOSCOPY WITH PROPOFOL;  Surgeon: Regis Bill, MD;  Location: [facility] ENDOSCOPY;  Service: Endoscopy;  Laterality: N/A;   EYE SURGERY     KNEE SURGERY     NASAL SEPTUM SURGERY     Patient Active Problem List   Diagnosis Date Noted   COVID-19 01/03/2023   Parkinson's disease (HCC) 09/26/2022   Chronic idiopathic constipation 06/05/2022   Left lower quadrant abdominal pain 06/05/2022   Paroxysmal atrial fibrillation (HCC) 03/28/2022   Idiopathic hypotension 03/28/2022   Spondylolysis, lumbar region 03/28/2022   Avitaminosis D 03/28/2022   Myalgia due to statin 03/28/2022   Acquired thrombophilia (HCC) 03/28/2022   Hyperlipidemia 03/28/2022   Abnormal gait 03/28/2022   Mild intermittent asthma without complication 03/28/2022   Zenker's (hypopharyngeal) diverticulum 03/22/2018   Gastroesophageal reflux disease 03/22/2018   Fatty liver 10/09/2017   BPH (benign prostatic hyperplasia) 11/21/2014   Hypothyroidism 11/21/2014   Inguinal hernia 11/21/2014   OA (osteoarthritis) 11/21/2014   H/O adenomatous polyp of colon 10/14/2014    ONSET DATE: 04/28/2022 for PD, back pain  began around 09/10/21   REFERRING DIAG:  G20.A1 (ICD-10-CM) - Parkinson's disease  M54.9 (ICD-10-CM) - Back pain    THERAPY DIAG:  No diagnosis found.  Rationale for Evaluation and Treatment: Rehabilitation  SUBJECTIVE:                                                                                                                                                                                             SUBJECTIVE STATEMENT:    ***  Pt accompanied by: self  PERTINENT HISTORY: Per [name] PT from 09/07/22 PT discharge from Low back treatment for focussed on PD related impairments at our  clinic "Patient has attended 40 physical therapy treatment sessions since starting current episode of care on 12/20/2021. Patient at first struggled to make progress towards goals and has struggled at times with consistent attendance. However, over the past few weeks he has been improving with multi-discipline interventions including medications and increasing overall activity. It was also recently confirmed he has Parkinson's that is likely contributing to some of his abnormal posture and difficulty with full ROM during movements. Patient is being discharged today due to improvement in condition and so he can start PT with focus on rehabilitation techniques for Parkinson's. Patient was provided with a long term HEP to help him with long term management of low back pain."  PAIN:  Are you having pain? Yes: NPRS scale: 3/10 Pain location: R knee  Pain description: sharp  Aggravating factors: when flexing the knee in weight bearing  Relieving factors: PT exercises, movement   PRECAUTIONS: None  WEIGHT BEARING RESTRICTIONS: No  FALLS: Has patient fallen in last 6 months? No  LIVING ENVIRONMENT: Lives with: lives alone Lives in: House/apartment Stairs: Yes: External: 5 steps; on left going up Has following equipment at home: None  PLOF: Independent  PATIENT GOALS: Improve his low back pain,  improve posture, improve walking.   OBJECTIVE:   DIAGNOSTIC FINDINGS: MRI of L spine 07/12/21: Degenerative disc disease throughout the lumbar region with loss of disc height. No apparent compressive stenosis of the canal. Discogenic endplate marrow changes without active edema at L2-3, L3-4 and L4-5, which could correlate with regional axial back pain.   Left lateral recess and foraminal narrowing at L2-3, left foraminal narrowing at L3-4, and bilateral lateral recess and foraminal narrowing at L4-5. None of the stenoses are severe or are seen to result in definite neural compression.   Shallow left posterolateral disc herniation at L1-2 with caudal migration of a tiny amount of disc material to the left of midline. This does not appear to cause any significant compressive effect upon the neural structures.  COGNITION: Overall cognitive status: Within functional limits for tasks assessed   SENSATION: WFL   POSTURE: rounded shoulders, forward head, and increased thoracic kyphosis  LOWER EXTREMITY ROM:   WNL LOWER EXTREMITY MMT:    MMT Right Eval Left Eval  Hip flexion 4+ 4+  Hip extension    Hip abduction 5 5  Hip adduction 5 5  Hip internal rotation    Hip external rotation    Knee flexion 5 5  Knee extension 5 5  Ankle dorsiflexion 5 5  Ankle plantarflexion 5 5  Ankle inversion    Ankle eversion    (Blank rows = not tested)  BED MOBILITY: WNL   STAIRS: Level of Assistance: Complete Independence Stair Negotiation Technique: Alternating Pattern  with Bilateral Rails Number of Stairs: 4  Height of Stairs: 6   GAIT: Gait pattern:  forward posture and short step length bilateral Distance walked: 30 ft Assistive device utilized: None Level of assistance: Complete Independence   FUNCTIONAL TESTS:  5 times sit to stand: 13.07 6 minute walk test: 1414 ft  Mini Best Test:   PATIENT SURVEYS:  Modified Oswestry 10/50  FOTO 70, risk adjusted goal of  75     TODAY'S TREATMENT: DATE: 03/13/23  Unless otherwise stated, CGA was provided and gait belt donned in order to ensure pt safety   NMR:   Octane fitness level 3 x 6 min for B UE and LE reciprocal movements  Physical therapy treatment session today consisted  of completing assessment of goals and administration of testing as demonstrated and documented in flow sheet, treatment, and goals section of this note. Addition treatments may be found below.   Rows on with 17.5# on cable machine in standing, cues for posture 2 x 15 reps   TRX forward lunge with B shoulder extension ( for pec stretch and thoracic extension) 2 x 10 reps ea LE     PATIENT EDUCATION: Education details: Pt educated throughout session about proper posture and technique with exercises. Improved exercise technique, movement at target joints, use of target muscles after min to mod verbal, visual, tactile cues.   Person educated: Patient Education method: Explanation, Demonstration, and Verbal cues Education comprehension: verbalized understanding, returned demonstration, and needs further education  HOME EXERCISE PROGRAM:Pt to continue HEP as listed below Seated PWR! Moves handout gone over and provided.  Standing PWR! Moves handout reviewed and provided.  All fours modified to chair UE support with PWR! moves handout reviewed and provided Access Code: [email or web address] URL: [email or web address] Date: 12/11/2022 Prepared by: Precious Bard Exercises - Straight Leg Raise with Arm Support  - 1 x daily - 7 x weekly - 2 sets - 10 reps - 5 hold - Single Leg  - 1 x daily - 7 x weekly - 2 sets - 2 reps - 30 hold    GOALS: Goals reviewed with patient? Yes  SHORT TERM GOALS: Target date: 10/24/2022    Patient will be independent in progressive home exercise program for PD to improve strength/mobility for better functional independence with ADLs. Baseline: No HEP for PD; 09/28/2022 per previous note pt currently  perform PWR interventions as part of HEP; 10/31/22: Pt reports he feels comfortable with his HEP Goal status: MET   LONG TERM GOALS: Target date: 12/19/2022   1.  Patient  will complete five times sit to stand test in < 10 seconds indicating an increased LE strength and improved balance. Baseline: 13.07; 10/31/22 10.4 seconds hands-free; 12/11/22: 9.45 sec  Goal status:  MET   2.  Patient will increase FOTO score to equal to or greater than  75   to demonstrate statistically significant improvement in mobility and quality of life.  Baseline: 70; 10/31/2022: 71, 12/11/22: 82 Goal status: MET   3.  Patient will increase Mini best Balance score by > 4 points to demonstrate decreased fall risk during functional activities. Baseline: 19; 10/31/22: 23/28; 12/11/22: 25/28 Goal status: MET   4.  Patient will reduce dual task timed up and go to <11 seconds to reduce fall risk and demonstrate improved transfer/gait ability. Baseline: 12.97l 10/31/22: 8 sec Goal status: MET  5.  Patient will increase six minute walk test distance to >1000 for progression to community ambulator and improve gait ability Baseline: Test visit 2 09/28/2022: 1414 ft; 10/31/2022: 1628 ft (previously met) 10/7:1385  Goal status: REVISED/MET 09/28/2022  6. Pt will improve modified oswestry score by 5 points or greater in order to indicate improved function in relation to his back pain  Baseline: 10/50; 11/01/22: 60% 10/7: 30% Goal status: MET  7. Patient will be able to put his pants on without use of furniture to lean on.  Baseline: unable to hold R leg up > 80 deg hip flexion for longer than 5 seconds without UE support  03/05/23: Pt reports success with this task 6/10 times but hip is bothersome off and on.  Goal status: Initial     ASSESSMENT:  CLINICAL IMPRESSION:   *** Goals performed on  03/05/23, please refer to this note for details. Pt will continue to benefit from skilled physical therapy intervention to address impairments,  improve QOL, and attain therapy goals.      OBJECTIVE IMPAIRMENTS: Abnormal gait, decreased balance, decreased endurance, decreased mobility, difficulty walking, and hypomobility.   ACTIVITY LIMITATIONS: carrying, lifting, bending, standing, squatting, stairs, and locomotion level  PARTICIPATION LIMITATIONS: community activity  PERSONAL FACTORS: Age and 1-2 comorbidities: Asthma  are also affecting patient's functional outcome.   REHAB POTENTIAL: Good  CLINICAL DECISION MAKING: Stable/uncomplicated  EVALUATION COMPLEXITY: Low  PLAN:  PT FREQUENCY: 2x/week  PT DURATION: 12 weeks  PLANNED INTERVENTIONS: Therapeutic exercises, Therapeutic activity, Neuromuscular re-education, Balance training, Gait training, Patient/Family education, Self Care, Joint mobilization, Joint manipulation, Stair training, Dry Needling, Spinal mobilization, Cryotherapy, Moist heat, and Manual therapy  PLAN FOR NEXT SESSION:   balance, low back pain, and PD specific impairments, emphasis on large arm swing, work on L SL stance with R hip flexion > 80 deg ROM with less UE dependence, continue plan     Precious Bard PT ,DPT Physical Therapist- [facility]   03/13/23, 2:54 PM

## 2023-03-14 ENCOUNTER — Ambulatory Visit: Payer: Medicare Other

## 2023-03-14 DIAGNOSIS — R293 Abnormal posture: Secondary | ICD-10-CM | POA: Diagnosis not present

## 2023-03-14 DIAGNOSIS — G8929 Other chronic pain: Secondary | ICD-10-CM | POA: Diagnosis not present

## 2023-03-14 DIAGNOSIS — M5459 Other low back pain: Secondary | ICD-10-CM | POA: Diagnosis not present

## 2023-03-14 DIAGNOSIS — R262 Difficulty in walking, not elsewhere classified: Secondary | ICD-10-CM

## 2023-03-14 DIAGNOSIS — M546 Pain in thoracic spine: Secondary | ICD-10-CM | POA: Diagnosis not present

## 2023-03-14 DIAGNOSIS — R2689 Other abnormalities of gait and mobility: Secondary | ICD-10-CM | POA: Diagnosis not present

## 2023-03-14 DIAGNOSIS — R2681 Unsteadiness on feet: Secondary | ICD-10-CM | POA: Diagnosis not present

## 2023-03-14 DIAGNOSIS — M545 Low back pain, unspecified: Secondary | ICD-10-CM | POA: Diagnosis not present

## 2023-03-21 ENCOUNTER — Ambulatory Visit: Payer: Medicare Other

## 2023-03-26 ENCOUNTER — Ambulatory Visit: Payer: Medicare Other

## 2023-03-28 ENCOUNTER — Ambulatory Visit: Payer: Medicare Other

## 2023-03-30 ENCOUNTER — Ambulatory Visit: Payer: Medicare Other | Admitting: Family Medicine

## 2023-04-02 ENCOUNTER — Encounter: Payer: Self-pay | Admitting: Family Medicine

## 2023-04-02 ENCOUNTER — Ambulatory Visit: Payer: Medicare Other | Admitting: Family Medicine

## 2023-04-02 VITALS — BP 136/86 | HR 54 | Resp 16 | Ht 74.0 in | Wt 214.6 lb

## 2023-04-02 DIAGNOSIS — M791 Myalgia, unspecified site: Secondary | ICD-10-CM

## 2023-04-02 DIAGNOSIS — E785 Hyperlipidemia, unspecified: Secondary | ICD-10-CM

## 2023-04-02 DIAGNOSIS — M4306 Spondylolysis, lumbar region: Secondary | ICD-10-CM

## 2023-04-02 DIAGNOSIS — G20A1 Parkinson's disease without dyskinesia, without mention of fluctuations: Secondary | ICD-10-CM | POA: Diagnosis not present

## 2023-04-02 DIAGNOSIS — Z23 Encounter for immunization: Secondary | ICD-10-CM | POA: Diagnosis not present

## 2023-04-02 DIAGNOSIS — G8929 Other chronic pain: Secondary | ICD-10-CM | POA: Diagnosis not present

## 2023-04-02 DIAGNOSIS — G20B2 Parkinson's disease with dyskinesia, with fluctuations: Secondary | ICD-10-CM

## 2023-04-02 DIAGNOSIS — T466X5A Adverse effect of antihyperlipidemic and antiarteriosclerotic drugs, initial encounter: Secondary | ICD-10-CM

## 2023-04-02 DIAGNOSIS — D6869 Other thrombophilia: Secondary | ICD-10-CM

## 2023-04-02 DIAGNOSIS — I480 Paroxysmal atrial fibrillation: Secondary | ICD-10-CM

## 2023-04-02 DIAGNOSIS — E559 Vitamin D deficiency, unspecified: Secondary | ICD-10-CM

## 2023-04-02 DIAGNOSIS — E039 Hypothyroidism, unspecified: Secondary | ICD-10-CM

## 2023-04-02 DIAGNOSIS — M545 Low back pain, unspecified: Secondary | ICD-10-CM | POA: Diagnosis not present

## 2023-04-02 NOTE — Assessment & Plan Note (Signed)
A-fib on anticoagulation 

## 2023-04-02 NOTE — Assessment & Plan Note (Signed)
F/b [facility] No med changes

## 2023-04-02 NOTE — Progress Notes (Signed)
Established Patient Office Visit  Subjective   Patient ID: Daniel [name], male    DOB: August 23, 1950  Age: 72 y.o. MRN: [id number]  Chief Complaint  Patient presents with   Medical Management of Chronic Issues    HPI  Discussed the use of AI scribe software for clinical note transcription with the patient, who gave verbal consent to proceed.  History of Present Illness   The patient, with a history of Parkinson's disease, atrial fibrillation, and hypothyroidism, presents for a follow-up visit. They report persistent back pain despite recent physical therapy (PT) sessions. The patient was making progress with PT until they contracted COVID-19, which interrupted their exercise regimen and led to a regression in their condition. Despite meeting the PT goals, the patient still experiences significant discomfort, especially in the mornings. They express a desire to continue PT, believing it would be beneficial, but are limited by insurance coverage.  The patient also mentions that their Parkinson's disease symptoms do not seem to be significantly affecting them. They noticed some balance issues during PT but overall, they do not feel overly burdened by the condition. They are aware of the risk of falls associated with Parkinson's and are taking precautions to prevent such incidents.  The patient also mentions an incident of target shooting, an activity they had not participated in for a year due to their back pain. After the session, they noticed an improvement in their back condition, suggesting that the activity or the good posture required for it might have had a beneficial effect.         ROS    Objective:     BP 136/86 (BP Location: Left Arm, Patient Position: Sitting, Cuff Size: Large)   Pulse (!) 54   Resp 16   Ht 6\' 2"  (1.88 m)   Wt 214 lb 9.6 oz (97.3 kg)   BMI 27.55 kg/m    Physical Exam Vitals reviewed.  Constitutional:      General: He is not in acute distress.     Appearance: Normal appearance. He is not diaphoretic.  HENT:     Head: Normocephalic and atraumatic.  Eyes:     General: No scleral icterus.    Conjunctiva/sclera: Conjunctivae normal.  Cardiovascular:     Rate and Rhythm: Normal rate and regular rhythm.     Heart sounds: Normal heart sounds. No murmur heard. Pulmonary:     Effort: Pulmonary effort is normal. No respiratory distress.     Breath sounds: Normal breath sounds. No wheezing or rhonchi.  Musculoskeletal:     Cervical back: Neck supple.     Right lower leg: No edema.     Left lower leg: No edema.  Lymphadenopathy:     Cervical: No cervical adenopathy.  Skin:    General: Skin is warm and dry.     Findings: No rash.  Neurological:     Mental Status: He is alert and oriented to person, place, and time. Mental status is at baseline.  Psychiatric:        Mood and Affect: Mood normal.        Behavior: Behavior normal.      No results found for any visits on 04/02/23.    The 10-year ASCVD risk score (Arnett DK, et al., 2019) is: 21.2%    Assessment & Plan:   Problem List Items Addressed This Visit       Cardiovascular and Mediastinum   Paroxysmal atrial fibrillation (HCC)   Relevant Orders  CBC w/Diff/Platelet     Endocrine   Hypothyroidism - Primary   Relevant Orders   TSH     Nervous and Auditory   Parkinson's disease (HCC)    F/b [facility] Neuro No med changes        Musculoskeletal and Integument   Spondylolysis, lumbar region     Hematopoietic and Hemostatic   Acquired thrombophilia (HCC)    A-fib on anticoagulation      Relevant Orders   CBC w/Diff/Platelet     Other   Avitaminosis D   Relevant Orders   VITAMIN D 25 Hydroxy (Vit-D Deficiency, Fractures)   Myalgia due to statin   Hyperlipidemia   Relevant Orders   Comprehensive metabolic panel   Lipid panel   Other Visit Diagnoses     Immunization due       Relevant Orders   Flu Vaccine Trivalent High Dose (Fluad) (Completed)            Back Pain Patient reports persistent back pain despite physical therapy (PT). PT was interrupted due to COVID-19 infection and patient feels they have lost progress. Patient continues to perform exercises at home. -Continue home exercises. -Consider re-initiating PT in the new year if no improvement.  Parkinson's Disease Patient reports minimal symptoms from Parkinson's disease. Noted some balance issues during PT. Patient is aware of fall risk associated with Parkinson's disease. -Continue Carbidopa-Levodopa 1 pill TID.  Atrial Fibrillation No new complaints. -Continue Eliquis 5mg  BID.  Hypothyroidism No new complaints. -Continue Synthroid daily.  Gastroesophageal Reflux Disease (GERD) No new complaints. -Continue Pantoprazole as prescribed.  General Health Maintenance -Order labs for thyroid function, kidney and liver function, blood counts, cholesterol, and vitamin D. -Consider RSV vaccination. -Consider COVID-19 booster 3 months post-infection (around November 7th). -Schedule physical exam in 6 months (April 2025).        Return in about 6 months (around 09/30/2023) for AWV, CPE.    Shirlee Latch, MD

## 2023-04-03 ENCOUNTER — Encounter: Payer: Self-pay | Admitting: Family Medicine

## 2023-04-03 LAB — CBC WITH DIFFERENTIAL/PLATELET
Basophils Absolute: 0.1 10*3/uL (ref 0.0–0.2)
Basos: 1 %
EOS (ABSOLUTE): 0.3 10*3/uL (ref 0.0–0.4)
Eos: 5 %
Hematocrit: 46.2 % (ref 37.5–51.0)
Hemoglobin: 15.5 g/dL (ref 13.0–17.7)
Immature Grans (Abs): 0 10*3/uL (ref 0.0–0.1)
Immature Granulocytes: 0 %
Lymphocytes Absolute: 1.1 10*3/uL (ref 0.7–3.1)
Lymphs: 16 %
MCH: 29.6 pg (ref 26.6–33.0)
MCHC: 33.5 g/dL (ref 31.5–35.7)
MCV: 88 fL (ref 79–97)
Monocytes Absolute: 0.7 10*3/uL (ref 0.1–0.9)
Monocytes: 11 %
Neutrophils Absolute: 4.4 10*3/uL (ref 1.4–7.0)
Neutrophils: 67 %
Platelets: 257 10*3/uL (ref 150–450)
RBC: 5.24 x10E6/uL (ref 4.14–5.80)
RDW: 12.3 % (ref 11.6–15.4)
WBC: 6.5 10*3/uL (ref 3.4–10.8)

## 2023-04-03 LAB — COMPREHENSIVE METABOLIC PANEL
ALT: 6 [IU]/L (ref 0–44)
AST: 12 [IU]/L (ref 0–40)
Albumin: 4.2 g/dL (ref 3.8–4.8)
Alkaline Phosphatase: 83 [IU]/L (ref 44–121)
BUN/Creatinine Ratio: 14 (ref 10–24)
BUN: 15 mg/dL (ref 8–27)
Bilirubin Total: 0.7 mg/dL (ref 0.0–1.2)
CO2: 24 mmol/L (ref 20–29)
Calcium: 9.1 mg/dL (ref 8.6–10.2)
Chloride: 102 mmol/L (ref 96–106)
Creatinine, Ser: 1.04 mg/dL (ref 0.76–1.27)
Globulin, Total: 2.2 g/dL (ref 1.5–4.5)
Glucose: 95 mg/dL (ref 70–99)
Potassium: 4.2 mmol/L (ref 3.5–5.2)
Sodium: 140 mmol/L (ref 134–144)
Total Protein: 6.4 g/dL (ref 6.0–8.5)
eGFR: 76 mL/min/{1.73_m2} (ref >59)

## 2023-04-03 LAB — LIPID PANEL
Chol/HDL Ratio: 3.8 ratio (ref 0.0–5.0)
Cholesterol, Total: 212 mg/dL — ABNORMAL HIGH (ref 100–199)
HDL: 56 mg/dL (ref >39)
LDL Chol Calc (NIH): 139 mg/dL — ABNORMAL HIGH (ref 0–99)
Triglycerides: 93 mg/dL (ref 0–149)
VLDL Cholesterol Cal: 17 mg/dL (ref 5–40)

## 2023-04-03 LAB — VITAMIN D 25 HYDROXY (VIT D DEFICIENCY, FRACTURES): Vit D, 25-Hydroxy: 22.9 ng/mL — ABNORMAL LOW (ref 30.0–100.0)

## 2023-04-03 LAB — TSH: TSH: 1.85 u[IU]/mL (ref 0.450–4.500)

## 2023-04-03 MED ORDER — EZETIMIBE 10 MG PO TABS: 10.0000 mg | ORAL_TABLET | Freq: Every day | ORAL | 1 refills | Status: DC

## 2023-04-11 ENCOUNTER — Ambulatory Visit: Payer: Medicare Other

## 2023-04-18 ENCOUNTER — Ambulatory Visit: Payer: Medicare Other

## 2023-04-24 ENCOUNTER — Ambulatory Visit: Payer: Medicare Other

## 2023-04-30 ENCOUNTER — Ambulatory Visit: Payer: Medicare Other

## 2023-05-01 ENCOUNTER — Other Ambulatory Visit: Payer: Self-pay | Admitting: Family Medicine

## 2023-05-02 ENCOUNTER — Ambulatory Visit: Payer: Medicare Other

## 2023-05-08 ENCOUNTER — Ambulatory Visit: Payer: Medicare Other

## 2023-05-10 ENCOUNTER — Ambulatory Visit: Payer: Medicare Other

## 2023-05-14 ENCOUNTER — Ambulatory Visit: Payer: Medicare Other

## 2023-05-16 ENCOUNTER — Encounter: Payer: Self-pay | Admitting: Dermatology

## 2023-05-16 ENCOUNTER — Ambulatory Visit: Payer: Medicare Other

## 2023-05-16 ENCOUNTER — Ambulatory Visit: Payer: Medicare Other | Admitting: Dermatology

## 2023-05-16 DIAGNOSIS — L209 Atopic dermatitis, unspecified: Secondary | ICD-10-CM | POA: Diagnosis not present

## 2023-05-16 DIAGNOSIS — L578 Other skin changes due to chronic exposure to nonionizing radiation: Secondary | ICD-10-CM | POA: Diagnosis not present

## 2023-05-16 DIAGNOSIS — Z86018 Personal history of other benign neoplasm: Secondary | ICD-10-CM

## 2023-05-16 DIAGNOSIS — L821 Other seborrheic keratosis: Secondary | ICD-10-CM

## 2023-05-16 DIAGNOSIS — Z1283 Encounter for screening for malignant neoplasm of skin: Secondary | ICD-10-CM | POA: Diagnosis not present

## 2023-05-16 DIAGNOSIS — Z872 Personal history of diseases of the skin and subcutaneous tissue: Secondary | ICD-10-CM

## 2023-05-16 DIAGNOSIS — L2089 Other atopic dermatitis: Secondary | ICD-10-CM

## 2023-05-16 DIAGNOSIS — L820 Inflamed seborrheic keratosis: Secondary | ICD-10-CM | POA: Diagnosis not present

## 2023-05-16 DIAGNOSIS — D1801 Hemangioma of skin and subcutaneous tissue: Secondary | ICD-10-CM

## 2023-05-16 DIAGNOSIS — L814 Other melanin hyperpigmentation: Secondary | ICD-10-CM

## 2023-05-16 DIAGNOSIS — D229 Melanocytic nevi, unspecified: Secondary | ICD-10-CM

## 2023-05-16 DIAGNOSIS — D171 Benign lipomatous neoplasm of skin and subcutaneous tissue of trunk: Secondary | ICD-10-CM

## 2023-05-16 DIAGNOSIS — Z79899 Other long term (current) drug therapy: Secondary | ICD-10-CM

## 2023-05-16 DIAGNOSIS — W908XXA Exposure to other nonionizing radiation, initial encounter: Secondary | ICD-10-CM | POA: Diagnosis not present

## 2023-05-16 DIAGNOSIS — Z7189 Other specified counseling: Secondary | ICD-10-CM

## 2023-05-16 DIAGNOSIS — D179 Benign lipomatous neoplasm, unspecified: Secondary | ICD-10-CM

## 2023-05-16 MED ORDER — TRIAMCINOLONE ACETONIDE 0.1 % EX CREA
1.0000 | TOPICAL_CREAM | CUTANEOUS | 1 refills | Status: AC
Start: 2023-05-16 — End: ?

## 2023-05-16 NOTE — Progress Notes (Signed)
Follow-Up Visit   Subjective  Daniel [name] is a 72 y.o. male who presents for the following: Skin Cancer Screening and Full Body Skin Exam, hx of Dysplastic Nevus, hx of Aks, Itching back, 51m, otc Cortisone cream  The patient presents for Total-Body Skin Exam (TBSE) for skin cancer screening and mole check. The patient has spots, moles and lesions to be evaluated, some may be new or changing and the patient may have concern these could be cancer.    The following portions of the chart were reviewed this encounter and updated as appropriate: medications, allergies, medical history  Review of Systems:  No other skin or systemic complaints except as noted in HPI or Assessment and Plan.  Objective  Well appearing patient in no apparent distress; mood and affect are within normal limits.  A full examination was performed including scalp, head, eyes, ears, nose, lips, neck, chest, axillae, abdomen, back, buttocks, bilateral upper extremities, bilateral lower extremities, hands, feet, fingers, toes, fingernails, and toenails. All findings within normal limits unless otherwise noted below.   Relevant physical exam findings are noted in the Assessment and Plan.  R shoulder x 1 Stuck on waxy paps with erythema  Assessment & Plan   SKIN CANCER SCREENING PERFORMED TODAY.  ACTINIC DAMAGE - Chronic condition, secondary to cumulative UV/sun exposure - diffuse scaly erythematous macules with underlying dyspigmentation - Recommend daily broad spectrum sunscreen SPF 30+ to sun-exposed areas, reapply every 2 hours as needed.  - Staying in the shade or wearing long sleeves, sun glasses (UVA+UVB protection) and wide brim hats (4-inch brim around the entire circumference of the hat) are also recommended for sun protection.  - Call for new or changing lesions.  LENTIGINES, SEBORRHEIC KERATOSES, HEMANGIOMAS - Benign normal skin lesions - Benign-appearing - Call for any changes  MELANOCYTIC NEVI -  Tan-brown and/or pink-flesh-colored symmetric macules and papules - Benign appearing on exam today - Observation - Call clinic for new or changing moles - Recommend daily use of broad spectrum spf 30+ sunscreen to sun-exposed areas.   HISTORY OF DYSPLASTIC NEVUS No evidence of recurrence today Recommend regular full body skin exams Recommend daily broad spectrum sunscreen SPF 30+ to sun-exposed areas, reapply every 2 hours as needed.  Call if any new or changing lesions are noted between office visits  - Mid lower back  ATOPIC DERMATITIS back Exam: Scaly pink back 10% BSA  Chronic and persistent condition with duration or expected duration over one year. Condition is bothersome/symptomatic for patient. Currently flared.   Atopic dermatitis (eczema) is a chronic, relapsing, pruritic condition that can significantly affect quality of life. It is often associated with allergic rhinitis and/or asthma and can require treatment with topical medications, phototherapy, or in severe cases biologic injectable medication (Dupixent; Adbry) or Oral JAK inhibitors.  Treatment Plan: Start TMC 0.1% cr qd 5d/wk aa back until clear, then prn flares, avoid f/g/a  Recommend gentle skin care.   LENTIGOS/EARLY SEBORRHEIC KERATOSIS/ACTINIC CHANGES face Exam: brown macules face, actinic changes face  Treatment Plan: Counseling for BBL / IPL / Laser and Coordination of Care Discussed the treatment option of Broad Band Light (BBL) /Intense Pulsed Light (IPL)/ Laser for skin discoloration, including brown spots and redness.  Typically we recommend at least 1-3 treatment sessions about 5-8 weeks apart for best results.  Cannot have tanned skin when BBL performed, and regular use of sunscreen/photoprotection is advised after the procedure to help maintain results. The patient's condition may also require "maintenance treatments"  in the future.  The fee for BBL / laser treatments is $350 per treatment session  for the whole face.  A fee can be quoted for other parts of the body.  Insurance typically does not pay for BBL/laser treatments and therefore the fee is an out-of-pocket cost. Recommend prophylactic valtrex treatment. Once scheduled for procedure, will send Rx in prior to patient's appointment.    Lipoma  R mid back spinal Exam: Subcutaneous rubbery nodule(s) Location: R mid back spinal 4.0cm  Benign-appearing. Exam most consistent with a lipoma. Discussed that a lipoma is a benign fatty growth that can grow over time and sometimes get irritated. Recommend observation if it is not bothersome or changing. Discussed option of ILK injections or surgical excision to remove it if it is growing, symptomatic, or other changes noted. Please call for new or changing lesions so they can be evaluated.  INFLAMED SEBORRHEIC KERATOSIS R shoulder x 1 Symptomatic, irritating, patient would like treated. Destruction of lesion - R shoulder x 1 Complexity: simple   Destruction method: cryotherapy   Informed consent: discussed and consent obtained   Timeout:  patient name, date of birth, surgical site, and procedure verified Lesion destroyed using liquid nitrogen: Yes   Region frozen until ice ball extended beyond lesion: Yes   Outcome: patient tolerated procedure well with no complications   Post-procedure details: wound care instructions given   Return in about 1 year (around 05/15/2024) for TBSE, Hx of Dysplastic nevi, Hx of AKs, BBL appointment for face.  I, Ardis Rowan, RMA, am acting as scribe for Armida Sans, MD .   Documentation: I have reviewed the above documentation for accuracy and completeness, and I agree with the above.  Armida Sans, MD

## 2023-05-16 NOTE — Patient Instructions (Addendum)
Counseling for BBL / IPL / Laser and Coordination of Care Discussed the treatment option of Broad Band Light (BBL) /Intense Pulsed Light (IPL)/ Laser for skin discoloration, including brown spots and redness.  Typically we recommend at least 1-3 treatment sessions about 5-8 weeks apart for best results.  Cannot have tanned skin when BBL performed, and regular use of sunscreen/photoprotection is advised after the procedure to help maintain results. The patient's condition may also require "maintenance treatments" in the future.  The fee for BBL / laser treatments is $350 per treatment session for the whole face.  A fee can be quoted for other parts of the body.  Insurance typically does not pay for BBL/laser treatments and therefore the fee is an out-of-pocket cost. Recommend prophylactic valtrex treatment. Once scheduled for procedure, will send Rx in prior to patient's appointment.     Due to recent changes in healthcare laws, you may see results of your pathology and/or laboratory studies on MyChart before the doctors have had a chance to review them. We understand that in some cases there may be results that are confusing or concerning to you. Please understand that not all results are received at the same time and often the doctors may need to interpret multiple results in order to provide you with the best plan of care or course of treatment. Therefore, we ask that you please give [place] 2 business days to thoroughly review all your results before contacting the office for clarification. Should we see a critical lab result, you will be contacted sooner.   If You Need Anything After Your Visit  If you have any questions or concerns for your doctor, please call our main line at [phone] and press option 4 to reach your doctor's medical assistant. If no one answers, please leave a voicemail as directed and we will return your call as soon as possible. Messages left after 4 pm will be answered the  following business day.   You may also send [place] a message via MyChart. We typically respond to MyChart messages within 1-2 business days.  For prescription refills, please ask your pharmacy to contact our office. Our fax number is [phone].  If you have an urgent issue when the clinic is closed that cannot wait until the next business day, you can page your doctor at the number below.    Please note that while we do our best to be available for urgent issues outside of office hours, we are not available 24/7.   If you have an urgent issue and are unable to reach [place], you may choose to seek medical care at your doctor's office, retail clinic, urgent care center, or emergency room.  If you have a medical emergency, please immediately call 911 or go to the emergency department.  Pager Numbers  - Dr. Gwen Pounds: [phone]  - Dr. Roseanne Reno: [phone]  - Dr. Katrinka Blazing: [phone]   In the event of inclement weather, please call our main line at [phone] for an update on the status of any delays or closures.  Dermatology Medication Tips: Please keep the boxes that topical medications come in in order to help keep track of the instructions about where and how to use these. Pharmacies typically print the medication instructions only on the boxes and not directly on the medication tubes.   If your medication is too expensive, please contact our office at [phone] option 4 or send [place] a message through MyChart.   We are unable to tell what  your co-pay for medications will be in advance as this is different depending on your insurance coverage. However, we may be able to find a substitute medication at lower cost or fill out paperwork to get insurance to cover a needed medication.   If a prior authorization is required to get your medication covered by your insurance company, please allow [place] 1-2 business days to complete this process.  Drug prices often vary depending on where the  prescription is filled and some pharmacies may offer cheaper prices.  The website www.goodrx.com contains coupons for medications through different pharmacies. The prices here do not account for what the cost may be with help from insurance (it may be cheaper with your insurance), but the website can give you the price if you did not use any insurance.  - You can print the associated coupon and take it with your prescription to the pharmacy.  - You may also stop by our office during regular business hours and pick up a GoodRx coupon card.  - If you need your prescription sent electronically to a different pharmacy, notify our office through [facility] or by phone at [phone] option 4.     Si Usted Necesita Algo Despus de Su Visita  Tambin puede enviarnos un mensaje a travs de Clinical cytogeneticist. Por lo general respondemos a los mensajes de MyChart en el transcurso de 1 a 2 das hbiles.  Para renovar recetas, por favor pida a su farmacia que se ponga en contacto con nuestra oficina. Annie Sable de fax es [place] [phone].  Si tiene un asunto urgente cuando la clnica est cerrada y que no puede esperar hasta el siguiente da hbil, puede llamar/localizar a su doctor(a) al nmero que aparece a continuacin.   Por favor, tenga en cuenta que aunque hacemos todo lo posible para estar disponibles para asuntos urgentes fuera del horario de [place], no estamos disponibles las 24 horas del da, los 7 [address]  [address] de la [place].   Si tiene un problema urgente y no puede comunicarse con nosotros, puede optar por buscar atencin mdica  en el consultorio de su doctor(a), en una clnica privada, en un centro de atencin urgente o en una sala de emergencias.  Si tiene Engineer, drilling, por favor llame inmediatamente al 911 o vaya a la sala de emergencias.  Nmeros de bper  - Dr. Gwen Pounds: [phone]  - Dra. Roseanne Reno: [phone]  - Dr. Katrinka Blazing: [phone]   En caso de inclemencias del  tiempo, por favor llame a Lacy Duverney principal al [phone] para una actualizacin sobre el [place] de cualquier retraso o cierre.  Consejos para la medicacin en dermatologa: Por favor, guarde las cajas en las que vienen los medicamentos de uso tpico para ayudarle a seguir las instrucciones sobre dnde y cmo usarlos. Las farmacias generalmente imprimen las instrucciones del medicamento slo en las cajas y no directamente en los tubos del [place].   Si su medicamento es muy caro, por favor, pngase en contacto con Rolm Gala llamando al [phone] y presione la opcin 4 o envenos un mensaje a travs de Clinical cytogeneticist.   No podemos decirle cul ser su copago por los medicamentos por adelantado ya que esto es diferente dependiendo de la cobertura de su seguro. Sin embargo, es posible que podamos encontrar un medicamento sustituto a Audiological scientist un formulario para que el seguro cubra el medicamento que se considera necesario.   Si se requiere una autorizacin previa para que su compaa de seguros [place] su  medicamento, por favor permtanos de 1 a 2 das hbiles para completar [address]  Los precios de los medicamentos varan con frecuencia dependiendo del Environmental consultant de dnde se surte la receta y alguna farmacias pueden ofrecer precios ms baratos.  El sitio web www.goodrx.com tiene cupones para medicamentos de Health and safety inspector. Los precios aqu no tienen en cuenta lo que podra costar con la ayuda del seguro (puede ser ms barato con su seguro), pero el sitio web puede darle el precio si no utiliz Tourist information centre manager.  - Puede imprimir el cupn correspondiente y llevarlo con su receta a la farmacia.  - Tambin puede pasar por nuestra oficina durante el horario de atencin regular y Education officer, museum una tarjeta de cupones de GoodRx.  - Si necesita que su receta se enve electrnicamente a una farmacia diferente, informe a nuestra oficina a travs de MyChart de [place] o por telfono  llamando al [phone] y presione la opcin 4.

## 2023-05-21 ENCOUNTER — Ambulatory Visit: Payer: Medicare Other

## 2023-05-24 ENCOUNTER — Ambulatory Visit: Payer: Medicare Other

## 2023-05-27 ENCOUNTER — Encounter: Payer: Self-pay | Admitting: Dermatology

## 2023-05-31 DIAGNOSIS — M48062 Spinal stenosis, lumbar region with neurogenic claudication: Secondary | ICD-10-CM | POA: Diagnosis not present

## 2023-05-31 DIAGNOSIS — M47816 Spondylosis without myelopathy or radiculopathy, lumbar region: Secondary | ICD-10-CM | POA: Diagnosis not present

## 2023-05-31 DIAGNOSIS — M5416 Radiculopathy, lumbar region: Secondary | ICD-10-CM | POA: Diagnosis not present

## 2023-05-31 DIAGNOSIS — Z79899 Other long term (current) drug therapy: Secondary | ICD-10-CM | POA: Diagnosis not present

## 2023-06-22 ENCOUNTER — Other Ambulatory Visit: Payer: Self-pay | Admitting: *Deleted

## 2023-06-22 DIAGNOSIS — R972 Elevated prostate specific antigen [PSA]: Secondary | ICD-10-CM

## 2023-07-04 ENCOUNTER — Other Ambulatory Visit: Payer: Medicare Other

## 2023-07-04 DIAGNOSIS — R972 Elevated prostate specific antigen [PSA]: Secondary | ICD-10-CM | POA: Diagnosis not present

## 2023-07-05 LAB — PSA: Prostate Specific Ag, Serum: 2 ng/mL (ref 0.0–4.0)

## 2023-07-06 ENCOUNTER — Ambulatory Visit: Payer: Medicare Other | Admitting: Urology

## 2023-07-16 ENCOUNTER — Encounter (INDEPENDENT_AMBULATORY_CARE_PROVIDER_SITE_OTHER): Payer: Self-pay | Admitting: Family Medicine

## 2023-07-16 DIAGNOSIS — E785 Hyperlipidemia, unspecified: Secondary | ICD-10-CM

## 2023-07-16 DIAGNOSIS — M47817 Spondylosis without myelopathy or radiculopathy, lumbosacral region: Secondary | ICD-10-CM | POA: Diagnosis not present

## 2023-07-23 MED ORDER — PREDNISONE 20 MG PO TABS: 20.0000 mg | ORAL_TABLET | Freq: Every day | ORAL | 0 refills | Status: DC

## 2023-07-23 NOTE — Telephone Encounter (Signed)
 Please see the MyChart message reply(ies) for my assessment and plan.    This patient gave consent for this Medical Advice Message and is aware that it may result in a bill to [employer], as well as the possibility of receiving a bill for a co-payment or deductible. They are an established patient, but are not seeking medical advice exclusively about a problem treated during an in person or video visit in the last seven days. I did not recommend an in person or video visit within seven days of my reply.    I spent a total of 5 minutes cumulative time within 7 days through [employer].  Shirlee Latch, MD

## 2023-08-01 ENCOUNTER — Encounter: Payer: Self-pay | Admitting: Urology

## 2023-08-01 ENCOUNTER — Ambulatory Visit: Payer: Medicare Other | Admitting: Urology

## 2023-08-01 VITALS — BP 124/83 | HR 63 | Ht 74.0 in | Wt 208.0 lb

## 2023-08-01 DIAGNOSIS — N401 Enlarged prostate with lower urinary tract symptoms: Secondary | ICD-10-CM | POA: Diagnosis not present

## 2023-08-01 DIAGNOSIS — Z125 Encounter for screening for malignant neoplasm of prostate: Secondary | ICD-10-CM | POA: Diagnosis not present

## 2023-08-01 NOTE — Progress Notes (Signed)
 I, Maysun Anabel Bene, acting as a scribe for Riki Altes, MD., have documented all relevant documentation on the behalf of Riki Altes, MD, as directed by Riki Altes, MD while in the presence of Riki Altes, MD.  08/01/2023 4:47 PM   Genella Rife 1951-05-02 [id number]  Referring provider: Erasmo Downer, MD [address] [place],  [place] [address]  Chief Complaint  Patient presents with   Elevated PSA   Urologic history: 1.  BPH with LUTS UroLift 12/2017   2.  Erectile dysfunction Sildenafil 60 mg   3.  History abnormal PSA velocity TRUS/biopsy 12/2010 PSA 2.8 with benign pathology  HPI: Daniel [name] is a 73 y.o. male presents for annual follow-up.  No problems since last year's visit.  Denies bothersome lower urinary tract symptoms.  No dysuria or gross hematuria.  No flank, abdominal, or pelvic pain.  PSA 07/04/2023 stable at 2.0   PMH: Past Medical History:  Diagnosis Date   Actinic keratosis    Asthma    COVID-19 01/03/2023   Eczema    Hx of dysplastic nevus 06/06/2006   Mid lower back, slight atypia   Hypothyroidism    Seasonal allergies    Thyroid disease     Surgical History: Past Surgical History:  Procedure Laterality Date   CATARACT EXTRACTION     COLON SURGERY     COLONOSCOPY WITH PROPOFOL N/A 11/18/2014   Procedure: COLONOSCOPY WITH PROPOFOL;  Surgeon: Scot Jun, MD;  Location: [facility] ENDOSCOPY;  Service: Endoscopy;  Laterality: N/A;   COLONOSCOPY WITH PROPOFOL N/A 12/13/2021   Procedure: COLONOSCOPY WITH PROPOFOL;  Surgeon: Regis Bill, MD;  Location: [facility] ENDOSCOPY;  Service: Endoscopy;  Laterality: N/A;   EYE SURGERY     KNEE SURGERY     NASAL SEPTUM SURGERY      Home Medications:  Allergies as of 08/01/2023       Reactions   Ciprofloxacin Hcl Other (See Comments)   Blisters break out   Levaquin [levofloxacin In D5w]    Sulfa Antibiotics Other (See Comments)   unknown         Medication List        Accurate as of August 01, 2023  4:47 PM. If you have any questions, ask your nurse or doctor.          STOP taking these medications    polyethylene glycol 17 g packet Commonly known as: MIRALAX / GLYCOLAX Stopped by: Riki Altes       TAKE these medications    carbidopa-levodopa 25-100 MG disintegrating tablet Commonly known as: PARCOPA Take 1 tablet by mouth 3 (three) times daily.   docusate sodium 100 MG capsule Commonly known as: COLACE Take 200 mg by mouth at bedtime.   Eliquis 5 MG Tabs tablet Generic drug: apixaban Take 5 mg by mouth 2 (two) times daily.   ezetimibe 10 MG tablet Commonly known as: Zetia Take 1 tablet (10 mg total) by mouth daily.   levothyroxine 100 MCG tablet Commonly known as: SYNTHROID TAKE 1 TABLET BY MOUTH EVERY DAY BEFORE BREAKFAST   metoprolol succinate 25 MG 24 hr tablet Commonly known as: TOPROL-XL Take 25 mg by mouth daily.   pantoprazole 40 MG tablet Commonly known as: PROTONIX Take 1 tablet (40 mg total) by mouth daily.   predniSONE 20 MG tablet Commonly known as: DELTASONE Take 1 tablet (20 mg total) by mouth daily with breakfast.   traMADol  50 MG tablet Commonly known as: ULTRAM Take 75 mg by mouth 2 (two) times daily.   triamcinolone cream 0.1 % Commonly known as: KENALOG Apply 1 Application topically as directed. Qd up to 5 days per week to aa rash back until clear, avoid face, groin, axilla   Vitamin D 125 MCG (5000 UT) Caps Take 5,000 Units by mouth daily.        Allergies:  Allergies  Allergen Reactions   Ciprofloxacin Hcl Other (See Comments)    Blisters break out   Levaquin [Levofloxacin In D5w]    Sulfa Antibiotics Other (See Comments)    unknown    Family History: Family History  Problem Relation Age of Onset   Colon cancer Mother    Cervical cancer Mother    Colon polyps Mother    Osteoporosis Mother    Hypertension Father    Leukemia Father    Arthritis  Brother    Heart attack Brother    Throat cancer Maternal Grandmother     Social History:  reports that he quit smoking about 34 years ago. His smoking use included cigarettes. He has never used smokeless tobacco. He reports that he does not currently use alcohol. He reports that he does not use drugs.   Physical Exam: BP 124/83   Pulse 63   Ht 6\' 2"  (1.88 m)   Wt 208 lb (94.3 kg)   BMI 26.71 kg/m   Constitutional:  Alert and oriented, No acute distress. HEENT: [address] AT, moist mucus membranes.  Trachea midline, no masses. Cardiovascular: No clubbing, cyanosis, or edema. Respiratory: Normal respiratory effort, no increased work of breathing. GI: Abdomen is soft, nontender, nondistended, no abdominal masses Skin: No rashes, bruises or suspicious lesions. Neurologic: Grossly intact, no focal deficits, moving all 4 extremities. Psychiatric: Normal mood and affect.   Assessment & Plan:    1. BPH with LUTS No problems status post UroLift   2. Prostate cancer screening Stable PSA.  Continue annual follow-up.  [facility] [address] [place], [place] [address] [phone]

## 2023-08-13 ENCOUNTER — Ambulatory Visit: Payer: Self-pay | Admitting: Family Medicine

## 2023-08-13 NOTE — Telephone Encounter (Signed)
 Copied from CRM [id number]. Topic: Clinical - Red Word Triage >> Aug 13, 2023  4:52 PM Albin Felling L wrote: Red Word that prompted transfer to Nurse Triage: Weak and doesn't feel good, positive for Flu type A  Chief Complaint: Positive flu test Symptoms: Weakness, fatigue, cough, nausea Frequency: A week Pertinent Negatives: Patient denies difficulty breathing Disposition: [] ED /[] Urgent Care (no appt availability in office) / [] Appointment(In office/virtual)/ []  [facility]/ [x] Home Care/ [] Refused Recommended Disposition /[] [facility]/ []  Follow-up with PCP Additional Notes: Patient called in because he tested positive for flu A. Patient stated his symptoms started on Tuesday of last week. Patient stated most of his symptoms have improved. Patient is still experiencing generalized weakness and fatigue. Patient also complained of a mild cough and nausea. Patient does not have a fever at this time. Patient denied difficulty breathing. Patient able to speak in clear and complete sentences while on the phone with this RN. This RN advised home care at this time, per protocol. This RN educated patient on when to call back if symptoms have not improved. Patient complied.   Reason for Disposition  [1] Probable influenza (fever) with no complications AND [2] NOT HIGH RISK  Answer Assessment - Initial Assessment Questions 1. WORST SYMPTOM: "What is your worst symptom?" (e.g., cough, runny nose, muscle aches, headache, sore throat, fever)      Weakness 2. ONSET: "When did your flu symptoms start?"      Last Tuesday 3. COUGH: "How bad is the cough?"       Minimal cough producing yellow mucous 4. RESPIRATORY DISTRESS: "Describe your breathing."      Denies 5. FEVER: "Do you have a fever?" If Yes, ask: "What is your temperature, how was it measured, and when did it start?"     Recent temperature of 101.0, denies fever at this time 6. EXPOSURE: "Were you exposed to someone with  influenza?"       Unknown 7. FLU VACCINE: "Did you get a flu shot this year?"     Yes 8. HIGH RISK DISEASE: "Do you have any chronic medical problems?" (e.g., heart or lung disease, asthma, weak immune system, or other HIGH RISK conditions)     Parkinson's disease, Afib 10. OTHER SYMPTOMS: "Do you have any other symptoms?"  (e.g., runny nose, muscle aches, headache, sore throat)      Weak, heartburn, nausea, dizziness  Protocols used: Influenza (Flu) - [facility]

## 2023-08-15 DIAGNOSIS — M7712 Lateral epicondylitis, left elbow: Secondary | ICD-10-CM | POA: Diagnosis not present

## 2023-08-24 ENCOUNTER — Other Ambulatory Visit: Payer: Self-pay | Admitting: Family Medicine

## 2023-08-27 NOTE — Telephone Encounter (Signed)
 Requested Prescriptions  Pending Prescriptions Disp Refills   levothyroxine (SYNTHROID) 100 MCG tablet [Pharmacy Med Name: LEVOTHYROXINE 0.100MG  ( ) TAB] 90 tablet 1    Sig: TAKE 1 TABLET BY MOUTH EVERY DAY BEFORE BREAKFAST     Endocrinology:  Hypothyroid Agents Failed - 08/27/2023  3:04 PM      Failed - Valid encounter within last 12 months    Recent Outpatient Visits   None     Future Appointments             In 1 month Bacigalupo, Marzella Schlein, MD [facility]   In 8 months Deirdre Evener, MD [facility]   In 11 months Stoioff, Verna Czech, MD [facility] [place]            Passed - TSH in normal range and within 360 days    TSH  Date Value Ref Range Status  04/02/2023 1.850 0.450 - 4.500 uIU/mL Final

## 2023-09-06 ENCOUNTER — Ambulatory Visit (INDEPENDENT_AMBULATORY_CARE_PROVIDER_SITE_OTHER): Admitting: Family Medicine

## 2023-09-06 ENCOUNTER — Encounter: Payer: Self-pay | Admitting: Family Medicine

## 2023-09-06 VITALS — BP 111/76 | HR 64 | Ht 74.0 in | Wt 211.0 lb

## 2023-09-06 DIAGNOSIS — R110 Nausea: Secondary | ICD-10-CM | POA: Diagnosis not present

## 2023-09-06 DIAGNOSIS — R197 Diarrhea, unspecified: Secondary | ICD-10-CM | POA: Diagnosis not present

## 2023-09-06 MED ORDER — ONDANSETRON HCL 4 MG PO TABS: 4.0000 mg | ORAL_TABLET | Freq: Three times a day (TID) | ORAL | 0 refills | Status: DC | PRN

## 2023-09-06 NOTE — Progress Notes (Signed)
 Acute visit   Patient: Daniel [name]   DOB: March 29, 1951   73 y.o. Male  MRN: [id number] PCP: Erasmo Downer, MD   Chief Complaint  Patient presents with   Diarrhea    Diarrhea X 3 days ago. Pt reports he finally got it to stop after taking anti-diarrhea pills from otc that he started yesterday. Reports it took 3 to stop it and it advised that he should not take more than 3 tablets in 24 hours. Temperature this morning was 96.7 around 7 am. Last bowel moevement was sometimes around 3-4 am. May have started on the 10th due to flu test after meaning and he had type A flu.   Subjective    Discussed the use of AI scribe software for clinical note transcription with the patient, who gave verbal consent to proceed.  History of Present Illness   The patient, with a history of Parkinson's disease, presents with diarrhea that started three days ago. Initially, it was mild and occurred after meals, but it worsened to the point where he was unable to sleep due to frequent bathroom trips. The stools were watery. He also reports a history of flu type A infection about a month ago, from which he is still recovering. He feels tired and believes that the flu might be contributing to his current symptoms.  The patient also reports a slight feeling of nausea and dizziness, which comes and goes. He has been able to maintain hydration by drinking Coca Cola and water. For food, he has been consuming bland items like grits. He has been taking anti-diarrheal medication as needed, up to three times a day.  The patient also mentions a concern about a low body temperature of 96.35F, which he fears might indicate an infection like sepsis. He also reports a slight pain in the lower abdomen. He denies any blood in the stool or any known exposure to sick individuals.        Review of Systems  Objective    BP 111/76 (BP Location: Left Arm, Patient Position: Sitting, Cuff Size: Normal)   Pulse 64   Ht 6'  2" (1.88 m)   Wt 211 lb (95.7 kg)   BMI 27.09 kg/m  Physical Exam Vitals reviewed.  Constitutional:      General: He is not in acute distress.    Appearance: Normal appearance. He is not diaphoretic.  HENT:     Head: Normocephalic and atraumatic.  Eyes:     General: No scleral icterus.    Conjunctiva/sclera: Conjunctivae normal.  Cardiovascular:     Rate and Rhythm: Normal rate and regular rhythm.     Heart sounds: Normal heart sounds. No murmur heard. Pulmonary:     Effort: Pulmonary effort is normal. No respiratory distress.     Breath sounds: Normal breath sounds. No wheezing or rhonchi.  Abdominal:     General: There is no distension.     Palpations: Abdomen is soft.     Tenderness: There is no abdominal tenderness. There is no guarding or rebound.  Musculoskeletal:     Cervical back: Neck supple.     Right lower leg: No edema.     Left lower leg: No edema.  Lymphadenopathy:     Cervical: No cervical adenopathy.  Skin:    General: Skin is warm and dry.     Findings: No rash.  Neurological:     Mental Status: He is alert and oriented to person, place,  and time. Mental status is at baseline.  Psychiatric:        Mood and Affect: Mood normal.        Behavior: Behavior normal.       No results found for any visits on 09/06/23.  Assessment & Plan     Problem List Items Addressed This Visit   None Visit Diagnoses       Diarrhea of presumed infectious origin    -  Primary     Nausea               Acute Gastroenteritis Presents with diarrhea for three days, characterized by watery stools post-meals, mild lower abdominal pain, and nausea. No vomiting or blood in stool. Anti-diarrheal medication provides some relief. Differential includes viral gastroenteritis, likely self-limiting, and less likely diverticulitis due to lack of localized persistent pain. Recent influenza A may have weakened immune response, increasing susceptibility. Low temperature noted, but  absence of other sepsis signs reduces severe bacterial infection likelihood. Most episodes are viral and self-resolve. - Continue anti-diarrheal medication as needed, not exceeding three doses per day. - Maintain hydration with fluids like water - Follow a bland diet, such as the BRAT diet (bananas, rice, applesauce, toast) or grits. - Monitor for new fever, persistent or localized abdominal pain, and worsening symptoms. - Consider diverticulitis if symptoms localize and persist, and initiate antibiotics if necessary. - Prescribe Zofran for nausea, to be taken up to three times a day as needed.  Influenza A (Post-Infectious Fatigue) Had influenza A approximately 30 days ago with ongoing fatigue, common during recovery. Immune system recovery may contribute to current infection susceptibility. Full recovery may take up to a month.  Parkinson's Disease Long-standing diagnosis managed with medication. Aware of new treatments being trialed on the [facility] with promising results. Condition well-managed with no significant bothersome symptoms reported. - Continue current Parkinson's medication regimen. - Stay informed about new treatment options.       Meds ordered this encounter  Medications   ondansetron (ZOFRAN) 4 MG tablet    Sig: Take 1 tablet (4 mg total) by mouth every 8 (eight) hours as needed for nausea or vomiting.    Dispense:  20 tablet    Refill:  0     Return if symptoms worsen or fail to improve.      Total time spent on today's visit was greater than 30 minutes, including both face-to-face time and nonface-to-face time personally spent on review of chart (labs and imaging), discussing further work-up, treatment options, answering patient's questions, and coordinating care.   Shirlee Latch, MD  [facility] [phone] (phone) [phone] (fax)  [facility]

## 2023-09-19 DIAGNOSIS — I950 Idiopathic hypotension: Secondary | ICD-10-CM | POA: Diagnosis not present

## 2023-09-19 DIAGNOSIS — E782 Mixed hyperlipidemia: Secondary | ICD-10-CM | POA: Diagnosis not present

## 2023-09-19 DIAGNOSIS — I480 Paroxysmal atrial fibrillation: Secondary | ICD-10-CM | POA: Diagnosis not present

## 2023-09-19 DIAGNOSIS — R001 Bradycardia, unspecified: Secondary | ICD-10-CM | POA: Diagnosis not present

## 2023-10-02 ENCOUNTER — Ambulatory Visit (INDEPENDENT_AMBULATORY_CARE_PROVIDER_SITE_OTHER): Payer: Self-pay

## 2023-10-02 ENCOUNTER — Ambulatory Visit (INDEPENDENT_AMBULATORY_CARE_PROVIDER_SITE_OTHER): Payer: Self-pay | Admitting: Family Medicine

## 2023-10-02 ENCOUNTER — Encounter: Payer: Self-pay | Admitting: Family Medicine

## 2023-10-02 VITALS — BP 126/72 | Ht 74.0 in | Wt 212.0 lb

## 2023-10-02 VITALS — BP 126/72 | Ht 74.0 in | Wt 212.5 lb

## 2023-10-02 DIAGNOSIS — T466X5D Adverse effect of antihyperlipidemic and antiarteriosclerotic drugs, subsequent encounter: Secondary | ICD-10-CM

## 2023-10-02 DIAGNOSIS — I480 Paroxysmal atrial fibrillation: Secondary | ICD-10-CM

## 2023-10-02 DIAGNOSIS — D6869 Other thrombophilia: Secondary | ICD-10-CM

## 2023-10-02 DIAGNOSIS — E039 Hypothyroidism, unspecified: Secondary | ICD-10-CM

## 2023-10-02 DIAGNOSIS — E559 Vitamin D deficiency, unspecified: Secondary | ICD-10-CM

## 2023-10-02 DIAGNOSIS — Z0000 Encounter for general adult medical examination without abnormal findings: Secondary | ICD-10-CM

## 2023-10-02 DIAGNOSIS — G20B2 Parkinson's disease with dyskinesia, with fluctuations: Secondary | ICD-10-CM

## 2023-10-02 DIAGNOSIS — K219 Gastro-esophageal reflux disease without esophagitis: Secondary | ICD-10-CM

## 2023-10-02 DIAGNOSIS — M791 Myalgia, unspecified site: Secondary | ICD-10-CM | POA: Diagnosis not present

## 2023-10-02 DIAGNOSIS — Z0001 Encounter for general adult medical examination with abnormal findings: Secondary | ICD-10-CM

## 2023-10-02 DIAGNOSIS — E785 Hyperlipidemia, unspecified: Secondary | ICD-10-CM

## 2023-10-02 DIAGNOSIS — K760 Fatty (change of) liver, not elsewhere classified: Secondary | ICD-10-CM

## 2023-10-02 MED ORDER — ONDANSETRON HCL 4 MG PO TABS: 4.0000 mg | ORAL_TABLET | Freq: Three times a day (TID) | ORAL | 1 refills | Status: DC | PRN

## 2023-10-02 NOTE — Patient Instructions (Addendum)
 Mr. Mangal , Thank you for taking time to come for your Medicare Wellness Visit. I appreciate your ongoing commitment to your health goals. Please review the following plan we discussed and let me know if I can assist you in the future.   Referrals/Orders/Follow-Ups/Clinician Recommendations: NONE  This is a list of the screening recommended for you and due dates:  Health Maintenance  Topic Date Due   COVID-19 Vaccine (5 - 2024-25 season) 01/28/2023   Flu Shot  12/28/2023   Medicare Annual Wellness Visit  10/01/2024   Colon Cancer Screening  12/13/2024   DTaP/Tdap/Td vaccine (3 - Td or Tdap) 04/06/2030   Pneumonia Vaccine  Completed   Hepatitis C Screening  Completed   Zoster (Shingles) Vaccine  Completed   HPV Vaccine  Aged Out   Meningitis B Vaccine  Aged Out    Advanced directives: (Copy Requested) Please bring a copy of your health care power of attorney and living will to the office to be added to your chart at your convenience. You can mail to [facility] [address] [place], [place] [address] or email to [email or web address]  Next Medicare Annual Wellness Visit scheduled for next year: Yes   10/08/24 @ 8:10 AM IN PERSON  Have you seen your provider in the last 6 months (3 months if uncontrolled diabetes)? YesTODAY

## 2023-10-02 NOTE — Assessment & Plan Note (Signed)
 Discontinued cholesterol medication (Zetia ) due to joint pain. Considering dietary modifications such as increased fiber intake to manage cholesterol levels. Reports improvement in joint pain since stopping the medication. - Check cholesterol levels - Consider dietary modifications including increased fiber intake

## 2023-10-02 NOTE — Progress Notes (Signed)
 Complete physical exam   Patient: Daniel [name]   DOB: [birth year]   73 y.o. Male  MRN: [id number] Visit Date: 10/02/2023  Today's healthcare provider: Aden Agreste, MD   Chief Complaint  Patient presents with   Annual Exam    Last completed AWV completed today with Dellie Fergusson, LPN Diet -  General, well balanced trying to eat veggies more than meat Exercise - daily for 3 blocks a day and PT daily at home for 45 minutes Feeling - well Sleeping - well  Concerns -  none   Subjective    Daniel [name] is a 73 y.o. male who presents today for a complete physical exam.   Discussed the use of AI scribe software for clinical note transcription with the patient, who gave verbal consent to proceed.  History of Present Illness   Daniel [name] "Josiah Nigh" is a 73 year old male who presents for an annual physical exam.  He has paroxysmal atrial fibrillation, managed with Eliquis 5 mg twice daily, with stable episodes recently. Blood pressure is elevated in the mornings but stabilizes after about an hour.  Parkinson's disease is managed with carvedopa levodopa three times daily, and he reports improved ease in walking. Joint pain, previously attributed to Zetia , has improved since discontinuation.  He takes Synthroid  100 mcg daily for hypothyroidism, along with other morning medications, and plans to monitor thyroid  levels.  Gastrointestinal discomfort occurs with multiple medications, and he has started staggering intake. He takes pantoprazole  40 mg daily for GERD and uses Zofran  for nausea effectively.  He is up to date with pneumonia, shingles, and tetanus vaccinations. His colonoscopy is due next year, and a recent PSA test was normal.        Last depression screening scores    10/02/2023    8:18 AM 04/02/2023    8:13 AM 09/26/2022   10:44 AM  PHQ 2/9 Scores  PHQ - 2 Score 0 0 0  PHQ- 9 Score 0  0   Last fall risk screening    10/02/2023    8:21 AM  Fall Risk   Falls  in the past year? 0  Number falls in past yr: 0  Injury with Fall? 0  Risk for fall due to : No Fall Risks  Follow up Falls prevention discussed;Falls evaluation completed        Medications: Outpatient Medications Prior to Visit  Medication Sig   carbidopa-levodopa (PARCOPA) 25-100 MG disintegrating tablet Take 1 tablet by mouth 3 (three) times daily.   Cholecalciferol (VITAMIN D ) 125 MCG (5000 UT) CAPS Take 5,000 Units by mouth daily.   docusate sodium (COLACE) 100 MG capsule Take 200 mg by mouth at bedtime.   ELIQUIS 5 MG TABS tablet Take 5 mg by mouth 2 (two) times daily.   levothyroxine  (SYNTHROID ) 100 MCG tablet TAKE 1 TABLET BY MOUTH EVERY DAY BEFORE BREAKFAST   metoprolol succinate (TOPROL-XL) 25 MG 24 hr tablet Take 25 mg by mouth daily.   pantoprazole  (PROTONIX ) 40 MG tablet Take 1 tablet (40 mg total) by mouth daily.   predniSONE  (DELTASONE ) 20 MG tablet Take 1 tablet (20 mg total) by mouth daily with breakfast. (Patient not taking: Reported on 10/02/2023)   traMADol (ULTRAM) 50 MG tablet Take 75 mg by mouth 2 (two) times daily.   triamcinolone  cream (KENALOG ) 0.1 % Apply 1 Application topically as directed. Qd up to 5 days per week to aa rash back until clear, avoid face, groin,  axilla   [DISCONTINUED] ezetimibe  (ZETIA ) 10 MG tablet Take 1 tablet (10 mg total) by mouth daily. (Patient not taking: Reported on 10/02/2023)   [DISCONTINUED] ondansetron  (ZOFRAN ) 4 MG tablet Take 1 tablet (4 mg total) by mouth every 8 (eight) hours as needed for nausea or vomiting.   No facility-administered medications prior to visit.    Review of Systems    Objective    BP 126/72   Ht 6\' 2"  (1.88 m)   Wt 212 lb (96.2 kg)   BMI 27.22 kg/m    Physical Exam Vitals reviewed.  Constitutional:      General: He is not in acute distress.    Appearance: Normal appearance. He is well-developed. He is not diaphoretic.  HENT:     Head: Normocephalic and atraumatic.     Right Ear: Tympanic  membrane, ear canal and external ear normal.     Left Ear: Tympanic membrane, ear canal and external ear normal.     Nose: Nose normal.     Mouth/Throat:     Mouth: Mucous membranes are moist.     Pharynx: Oropharynx is clear. No oropharyngeal exudate.  Eyes:     General: No scleral icterus.    Conjunctiva/sclera: Conjunctivae normal.     Pupils: Pupils are equal, round, and reactive to light.  Neck:     Thyroid : No thyromegaly.  Cardiovascular:     Rate and Rhythm: Normal rate and regular rhythm.     Heart sounds: Normal heart sounds. No murmur heard. Pulmonary:     Effort: Pulmonary effort is normal. No respiratory distress.     Breath sounds: Normal breath sounds. No wheezing or rales.  Abdominal:     General: There is no distension.     Palpations: Abdomen is soft.     Tenderness: There is no abdominal tenderness.  Musculoskeletal:        General: No deformity.     Cervical back: Neck supple.     Right lower leg: No edema.     Left lower leg: No edema.  Lymphadenopathy:     Cervical: No cervical adenopathy.  Skin:    General: Skin is warm and dry.     Findings: No rash.  Neurological:     Mental Status: He is alert and oriented to person, place, and time. Mental status is at baseline.     Gait: Gait normal.  Psychiatric:        Mood and Affect: Mood normal.        Behavior: Behavior normal.        Thought Content: Thought content normal.      No results found for any visits on 10/02/23.  Assessment & Plan    Routine Health Maintenance and Physical Exam  Exercise Activities and Dietary recommendations  Goals      DIET - EAT MORE FRUITS AND VEGETABLES        Immunization History  Administered Date(s) Administered   Fluad Quad(high Dose 65+) 02/05/2019, 02/18/2020, 03/23/2021, 02/14/2022   Fluad Trivalent(High Dose 65+) 04/02/2023   Influenza, High Dose Seasonal PF 02/25/2015, 02/15/2017, 02/13/2018   Influenza, Seasonal, Injecte, Preservative Fre  06/26/2016   PFIZER(Purple Top)SARS-COV-2 Vaccination 07/09/2019, 07/30/2019, 03/23/2020   Pfizer(Comirnaty)Fall Seasonal Vaccine 12 years and older 05/16/2022   Pneumococcal Conjugate-13 01/27/2015   Pneumococcal Polysaccharide-23 04/18/2018   Td 04/06/2020   Tdap 01/23/2007   Zoster Recombinant(Shingrix) 06/12/2018, 01/29/2020   Zoster, Live 05/31/2010    Health Maintenance  Topic Date Due  COVID-19 Vaccine (5 - 2024-25 season) 01/28/2023   INFLUENZA VACCINE  12/28/2023   Medicare Annual Wellness (AWV)  10/01/2024   Colonoscopy  12/13/2024   DTaP/Tdap/Td (3 - Td or Tdap) 04/06/2030   Pneumonia Vaccine 7+ Years old  Completed   Hepatitis C Screening  Completed   Zoster Vaccines- Shingrix  Completed   HPV VACCINES  Aged Out   Meningococcal B Vaccine  Aged Out    Discussed health benefits of physical activity, and encouraged him to engage in regular exercise appropriate for his age and condition.  Problem List Items Addressed This Visit       Cardiovascular and Mediastinum   Paroxysmal atrial fibrillation [facility])   Managed by cardiology. On Eliquis 5 mg twice daily and metoprolol 12.5 mg daily. Reports one incident of atrial fibrillation in the last few months. No palpitations on the lower dose of metoprolol. Blood pressure fluctuates, high in the morning but stabilizes later in the day.      Relevant Orders   Comprehensive metabolic panel with GFR   CBC w/Diff/Platelet     Digestive   Gastroesophageal reflux disease   Managed with pantoprazole  40 mg daily. No new symptoms reported.      Relevant Medications   ondansetron  (ZOFRAN ) 4 MG tablet   Fatty liver   Relevant Orders   Comprehensive metabolic panel with GFR     Endocrine   Hypothyroidism   On Synthroid  100 mcg daily. Discussed potential absorption issues if taken with other medications. Will monitor thyroid  function with lab tests. - Check thyroid  function      Relevant Orders   TSH     Nervous and  Auditory   Parkinson's disease (HCC)   Managed by neurology with carvedopa levodopa three times daily. Reports improvement in walking. No significant tremors observed. Inquired about new treatments for Parkinson's disease.         Hematopoietic and Hemostatic   Acquired thrombophilia (HCC)   A-fib on anticoagulation      Relevant Orders   CBC w/Diff/Platelet     Other   Avitaminosis D   Continue supplement Recheck level       Relevant Orders   VITAMIN D  25 Hydroxy (Vit-D Deficiency, Fractures)   Myalgia due to statin   Intolerant to statins and zetia       Relevant Orders   Lipid panel   Hyperlipidemia   Discontinued cholesterol medication (Zetia ) due to joint pain. Considering dietary modifications such as increased fiber intake to manage cholesterol levels. Reports improvement in joint pain since stopping the medication. - Check cholesterol levels - Consider dietary modifications including increased fiber intake      Relevant Orders   Comprehensive metabolic panel with GFR   Lipid panel   Other Visit Diagnoses       Encounter for annual physical exam    -  Primary   Relevant Orders   Comprehensive metabolic panel with GFR   Lipid panel   CBC w/Diff/Platelet   TSH   VITAMIN D  25 Hydroxy (Vit-D Deficiency, Fractures)          Wellness Visit Annual physical examination. Blood pressure is well-controlled. No significant changes in health status. Immunizations are current, with tetanus vaccination due in 2031. Colonoscopy is due next year. - Perform lab tests including cholesterol, kidney and liver function, vitamin D , blood count, and thyroid  function - Refill Zofran  for nausea      Return in about 6 months (around 04/03/2024) for chronic disease f/u.  Aden Agreste, MD  [facility] [phone] (phone) [phone] (fax)  [facility]

## 2023-10-02 NOTE — Progress Notes (Signed)
 Subjective:   Daniel [name] is a 73 y.o. who presents for a Medicare Wellness preventive visit.  Visit Complete: In person  Persons Participating in Visit: Patient.  AWV Questionnaire: No: Patient Medicare AWV questionnaire was not completed prior to this visit.  Cardiac Risk Factors include: hypertension;male gender     Objective:    Today's Vitals   10/02/23 0807 10/02/23 0811  BP: 126/72   Weight: 212 lb 8 oz (96.4 kg)   Height: 6\' 2"  (1.88 m)   PainSc:  4    Body mass index is 27.28 kg/m.     10/02/2023    8:20 AM 03/08/2022    4:19 PM 12/13/2021   10:12 AM 12/29/2020    4:27 PM 04/06/2016    2:09 PM 03/28/2016   11:23 AM 08/02/2015    9:45 AM  Advanced Directives  Does Patient Have a Medical Advance Directive? No Yes Yes Yes Yes Yes Yes  Type of Advance Directive   Living will;Healthcare Power of [employer] Power of [place];Living will Living will Healthcare Power of [place];Living will  Does patient want to make changes to medical advance directive?  No - Patient declined  No - Patient declined     Copy of Healthcare Power of Attorney in Chart?   No - copy requested      Would patient like information on creating a medical advance directive? No - Patient declined          Current Medications (verified) Outpatient Encounter Medications as of 10/02/2023  Medication Sig   carbidopa-levodopa (PARCOPA) 25-100 MG disintegrating tablet Take 1 tablet by mouth 3 (three) times daily.   Cholecalciferol (VITAMIN D ) 125 MCG (5000 UT) CAPS Take 5,000 Units by mouth daily.   docusate sodium (COLACE) 100 MG capsule Take 200 mg by mouth at bedtime.   ELIQUIS 5 MG TABS tablet Take 5 mg by mouth 2 (two) times daily.   levothyroxine  (SYNTHROID ) 100 MCG tablet TAKE 1 TABLET BY MOUTH EVERY DAY BEFORE BREAKFAST   metoprolol succinate (TOPROL-XL) 25 MG 24 hr tablet Take 25 mg by mouth daily.   ondansetron  (ZOFRAN ) 4 MG tablet Take 1 tablet (4 mg total) by mouth every 8 (eight)  hours as needed for nausea or vomiting.   pantoprazole  (PROTONIX ) 40 MG tablet Take 1 tablet (40 mg total) by mouth daily.   traMADol (ULTRAM) 50 MG tablet Take 75 mg by mouth 2 (two) times daily.   triamcinolone  cream (KENALOG ) 0.1 % Apply 1 Application topically as directed. Qd up to 5 days per week to aa rash back until clear, avoid face, groin, axilla   ezetimibe  (ZETIA ) 10 MG tablet Take 1 tablet (10 mg total) by mouth daily. (Patient not taking: Reported on 10/02/2023)   predniSONE  (DELTASONE ) 20 MG tablet Take 1 tablet (20 mg total) by mouth daily with breakfast. (Patient not taking: Reported on 10/02/2023)   No facility-administered encounter medications on file as of 10/02/2023.    Allergies (verified) Ciprofloxacin hcl, Levaquin [levofloxacin in d5w], and Sulfa antibiotics   History: Past Medical History:  Diagnosis Date   Actinic keratosis    Asthma    COVID-19 01/03/2023   Eczema    Hx of dysplastic nevus 06/06/2006   Mid lower back, slight atypia   Hypothyroidism    Seasonal allergies    Thyroid  disease    Past Surgical History:  Procedure Laterality Date   CATARACT EXTRACTION     COLON SURGERY     COLONOSCOPY  WITH PROPOFOL  N/A 11/18/2014   Procedure: COLONOSCOPY WITH PROPOFOL ;  Surgeon: Cassie Click, MD;  Location[facility] ENDOSCOPY;  Service: Endoscopy;  Laterality: N/A;   COLONOSCOPY WITH PROPOFOL  N/A 12/13/2021   Procedure: COLONOSCOPY WITH PROPOFOL ;  Surgeon: Shane Darling, MD;  Location: [facility] ENDOSCOPY;  Service: Endoscopy;  Laterality: N/A;   EYE SURGERY     KNEE SURGERY     NASAL SEPTUM SURGERY     Family History  Problem Relation Age of Onset   Colon cancer Mother    Cervical cancer Mother    Colon polyps Mother    Osteoporosis Mother    Hypertension Father    Leukemia Father    Arthritis Brother    Heart attack Brother    Throat cancer Maternal Grandmother    Social History   Socioeconomic History   Marital status: Widowed    Spouse name:  Not on file   Number of children: Not on file   Years of education: Not on file   Highest education level: Master's degree (e.g., MA, MS, MEng, MEd, MSW, MBA)  Occupational History   Not on file  Tobacco Use   Smoking status: Former    Current packs/day: 0.00    Types: Cigarettes    Quit date: 78    Years since quitting: 34.3   Smokeless tobacco: Never   Tobacco comments:    quit smoking 25 years ago  Vaping Use   Vaping status: Never Used  Substance and Sexual Activity   Alcohol use: Not Currently    Comment: 1-2 glasses of wine 4-5 times a week   Drug use: No   Sexual activity: Not on file  Other Topics Concern   Not on file  Social History Narrative   Not on file   Social Drivers of Health   Financial Resource Strain: Low Risk  (10/02/2023)   Overall Financial Resource Strain (CARDIA)    Difficulty of Paying Living Expenses: Not hard at all  Food Insecurity: No Food Insecurity (09/27/2023)   Hunger Vital Sign    Worried About Running Out of Food in the Last Year: Never true    Ran Out of Food in the Last Year: Never true  Transportation Needs: No Transportation Needs (10/02/2023)   PRAPARE - Administrator, Civil Service (Medical): No    Lack of Transportation (Non-Medical): No  Physical Activity: Insufficiently Active (10/02/2023)   Exercise Vital Sign    Days of Exercise per Week: 5 days    Minutes of Exercise per Session: 20 min  Stress: No Stress Concern Present (10/02/2023)   [employer] of Occupational Health - Occupational Stress Questionnaire    Feeling of Stress : Only a little  Social Connections: Moderately Integrated (10/02/2023)   Social Connection and Isolation Panel [NHANES]    Frequency of Communication with Friends and Family: More than three times a week    Frequency of Social Gatherings with Friends and Family: More than three times a week    Attends Religious Services: More than 4 times per year    Active Member of [employer] or  Organizations: Yes    Attends Banker Meetings: More than 4 times per year    Marital Status: Widowed    Tobacco Counseling Counseling given: Not Answered Tobacco comments: quit smoking 25 years ago    Clinical Intake:  Pre-visit preparation completed: Yes  Pain : 0-10 Pain Score: 4  Pain Type: Chronic pain Pain Location: Back Pain Orientation:  Lower Pain Descriptors / Indicators: Aching, Discomfort, Constant Pain Onset: More than a month ago Pain Frequency: Constant Pain Relieving Factors: walking, P.T. helped  Pain Relieving Factors: walking, P.T. helped  BMI - recorded: 27.28 Nutritional Status: BMI 25 -29 Overweight Nutritional Risks: None Diabetes: No  Lab Results  Component Value Date   HGBA1C 5.4 08/24/2021   HGBA1C 5.6 05/17/2021     How often do you need to have someone help you when you read instructions, pamphlets, or other written materials from your doctor or pharmacy?: 1 - Never  Interpreter Needed?: No  Information entered by :: Dellie Fergusson, LPN   Activities of Daily Living    10/02/2023    8:21 AM 09/27/2023    1:02 PM  In your present state of health, do you have any difficulty performing the following activities:  Hearing? 1 1  Vision? 0 0  Difficulty concentrating or making decisions? 0 0  Walking or climbing stairs? 1 0  Comment RIGHT KNEE- GOING DOWN TAKES ONE STEP AT A TIME   Dressing or bathing?  0  Doing errands, shopping?  0  Preparing Food and eating ?  N  Using the Toilet?  N  In the past six months, have you accidently leaked urine?  N  Do you have problems with loss of bowel control?  N  Managing your Medications?  N  Managing your Finances?  N  Housekeeping or managing your Housekeeping?  N    Patient Care Team: Mazie Speed, MD as PCP - General (Family Medicine) Dingeldein, Landon Pinion, MD (Ophthalmology)  Indicate any recent Medical Services you may have received from other than [facility] providers in the  past year (date may be approximate).     Assessment:   This is a routine wellness examination for Shihab.  Hearing/Vision screen Hearing Screening - Comments:: WEARS AIDS, BOTH EARS Vision Screening - Comments:: READERS- DR.DINGELDEIN   Goals Addressed             This Visit's Progress    DIET - EAT MORE FRUITS AND VEGETABLES         Depression Screen     10/02/2023    8:18 AM 04/02/2023    8:13 AM 09/26/2022   10:44 AM 03/28/2022   10:25 AM 12/05/2021    8:20 AM 06/23/2021    8:51 AM 02/16/2021    1:58 PM  PHQ 2/9 Scores  PHQ - 2 Score 0 0 0 0 0 0 0  PHQ- 9 Score 0  0 0 0 0 0    Fall Risk     10/02/2023    8:21 AM 09/27/2023    1:02 PM 09/26/2022   10:44 AM 03/28/2022   10:25 AM 12/05/2021    8:20 AM  Fall Risk   Falls in the past year? 0 0 0 0 0  Number falls in past yr: 0  0 0 0  Injury with Fall? 0  0 0 0  Risk for fall due to : No Fall Risks  No Fall Risks No Fall Risks No Fall Risks  Follow up Falls prevention discussed;Falls evaluation completed  Falls evaluation completed Falls evaluation completed Falls evaluation completed    MEDICARE RISK AT HOME:  Medicare Risk at Home Any stairs in or around the home?: Yes If so, are there any without handrails?: No Home free of loose throw rugs in walkways, pet beds, electrical cords, etc?: Yes Adequate lighting in your home to reduce risk of falls?: Yes  Life alert?: Yes Use of a cane, walker or w/c?: No Grab bars in the bathroom?: Yes Shower chair or bench in shower?: Yes Elevated toilet seat or a handicapped toilet?: No  TIMED UP AND GO:  Was the test performed?  Yes  Length of time to ambulate 10 feet: 4 sec Gait steady and fast without use of assistive device  Cognitive Function: 6CIT completed        10/02/2023    8:24 AM 05/27/2019    9:55 AM 04/18/2018    9:12 AM 04/06/2016    2:13 PM  6CIT Screen  What Year? 0 points 0 points 0 points 0 points  What month? 0 points 0 points 0 points 0 points  What  time? 3 points 0 points 0 points 0 points  Count back from 20 0 points 0 points 0 points 0 points  Months in reverse 0 points 0 points 0 points 0 points  Repeat phrase 0 points 0 points 0 points 4 points  Total Score 3 points 0 points 0 points 4 points    Immunizations Immunization History  Administered Date(s) Administered   Fluad Quad(high Dose 65+) 02/05/2019, 02/18/2020, 03/23/2021, 02/14/2022   Fluad Trivalent(High Dose 65+) 04/02/2023   Influenza, High Dose Seasonal PF 02/25/2015, 02/15/2017, 02/13/2018   Influenza, Seasonal, Injecte, Preservative Fre 06/26/2016   PFIZER(Purple Top)SARS-COV-2 Vaccination 07/09/2019, 07/30/2019, 03/23/2020   Pfizer(Comirnaty)Fall Seasonal Vaccine 12 years and older 05/16/2022   Pneumococcal Conjugate-13 01/27/2015   Pneumococcal Polysaccharide-23 04/18/2018   Td 04/06/2020   Tdap 01/23/2007   Zoster Recombinant(Shingrix) 06/12/2018, 01/29/2020   Zoster, Live 05/31/2010    Screening Tests Health Maintenance  Topic Date Due   COVID-19 Vaccine (5 - 2024-25 season) 01/28/2023   INFLUENZA VACCINE  12/28/2023   Medicare Annual Wellness (AWV)  10/01/2024   Colonoscopy  12/13/2024   DTaP/Tdap/Td (3 - Td or Tdap) 04/06/2030   Pneumonia Vaccine 68+ Years old  Completed   Hepatitis C Screening  Completed   Zoster Vaccines- Shingrix  Completed   HPV VACCINES  Aged Out   Meningococcal B Vaccine  Aged Out    Health Maintenance  Health Maintenance Due  Topic Date Due   COVID-19 Vaccine (5 - 2024-25 season) 01/28/2023   Health Maintenance Items Addressed: UP TO DATE ON SHOTS,  COLONOSCOPY  Additional Screening:  Vision Screening: Recommended annual ophthalmology exams for early detection of glaucoma and other disorders of the eye.  Dental Screening: Recommended annual dental exams for proper oral hygiene  Community Resource Referral / Chronic Care Management: CRR required this visit?  No   CCM required this visit?  No     Plan:      I have personally reviewed and noted the following in the patient's chart:   Medical and social history Use of alcohol, tobacco or illicit drugs  Current medications and supplements including opioid prescriptions. Patient is not currently taking opioid prescriptions. Functional ability and status Nutritional status Physical activity Advanced directives List of other physicians Hospitalizations, surgeries, and ER visits in previous 12 months Vitals Screenings to include cognitive, depression, and falls Referrals and appointments  In addition, I have reviewed and discussed with patient certain preventive protocols, quality metrics, and best practice recommendations. A written personalized care plan for preventive services as well as general preventive health recommendations were provided to patient.     Pinky Bright, LPN   06/03/1094   After Visit Summary: (In Person-Declined) Patient declined AVS at this time.  Notes: Nothing significant  to report at this time.

## 2023-10-02 NOTE — Assessment & Plan Note (Signed)
 Continue supplement Recheck level

## 2023-10-02 NOTE — Assessment & Plan Note (Signed)
 Managed by cardiology. On Eliquis 5 mg twice daily and metoprolol 12.5 mg daily. Reports one incident of atrial fibrillation in the last few months. No palpitations on the lower dose of metoprolol. Blood pressure fluctuates, high in the morning but stabilizes later in the day.

## 2023-10-02 NOTE — Assessment & Plan Note (Signed)
 Managed with pantoprazole  40 mg daily. No new symptoms reported.

## 2023-10-02 NOTE — Assessment & Plan Note (Signed)
A-fib on anticoagulation 

## 2023-10-02 NOTE — Assessment & Plan Note (Signed)
 On Synthroid  100 mcg daily. Discussed potential absorption issues if taken with other medications. Will monitor thyroid  function with lab tests. - Check thyroid  function

## 2023-10-02 NOTE — Assessment & Plan Note (Signed)
Intolerant to statins and zetia

## 2023-10-02 NOTE — Assessment & Plan Note (Signed)
 Managed by neurology with carvedopa levodopa three times daily. Reports improvement in walking. No significant tremors observed. Inquired about new treatments for Parkinson's disease.

## 2023-10-03 ENCOUNTER — Encounter: Payer: Self-pay | Admitting: Family Medicine

## 2023-10-03 LAB — COMPREHENSIVE METABOLIC PANEL WITH GFR
ALT: 7 IU/L (ref 0–44)
AST: 14 IU/L (ref 0–40)
Albumin: 4.4 g/dL (ref 3.8–4.8)
Alkaline Phosphatase: 79 IU/L (ref 44–121)
BUN/Creatinine Ratio: 14 (ref 10–24)
BUN: 15 mg/dL (ref 8–27)
Bilirubin Total: 0.8 mg/dL (ref 0.0–1.2)
CO2: 22 mmol/L (ref 20–29)
Calcium: 9.5 mg/dL (ref 8.6–10.2)
Chloride: 103 mmol/L (ref 96–106)
Creatinine, Ser: 1.1 mg/dL (ref 0.76–1.27)
Globulin, Total: 2.3 g/dL (ref 1.5–4.5)
Glucose: 96 mg/dL (ref 70–99)
Potassium: 4.6 mmol/L (ref 3.5–5.2)
Sodium: 142 mmol/L (ref 134–144)
Total Protein: 6.7 g/dL (ref 6.0–8.5)
eGFR: 70 mL/min/{1.73_m2} (ref >59)

## 2023-10-03 LAB — LIPID PANEL
Chol/HDL Ratio: 4 ratio (ref 0.0–5.0)
Cholesterol, Total: 184 mg/dL (ref 100–199)
HDL: 46 mg/dL (ref >39)
LDL Chol Calc (NIH): 121 mg/dL — ABNORMAL HIGH (ref 0–99)
Triglycerides: 95 mg/dL (ref 0–149)
VLDL Cholesterol Cal: 17 mg/dL (ref 5–40)

## 2023-10-03 LAB — CBC WITH DIFFERENTIAL/PLATELET
Basophils Absolute: 0.1 10*3/uL (ref 0.0–0.2)
Basos: 1 %
EOS (ABSOLUTE): 0.3 10*3/uL (ref 0.0–0.4)
Eos: 4 %
Hematocrit: 46 % (ref 37.5–51.0)
Hemoglobin: 15.7 g/dL (ref 13.0–17.7)
Immature Grans (Abs): 0 10*3/uL (ref 0.0–0.1)
Immature Granulocytes: 0 %
Lymphocytes Absolute: 1 10*3/uL (ref 0.7–3.1)
Lymphs: 14 %
MCH: 29.2 pg (ref 26.6–33.0)
MCHC: 34.1 g/dL (ref 31.5–35.7)
MCV: 86 fL (ref 79–97)
Monocytes Absolute: 0.6 10*3/uL (ref 0.1–0.9)
Monocytes: 9 %
Neutrophils Absolute: 5 10*3/uL (ref 1.4–7.0)
Neutrophils: 72 %
Platelets: 275 10*3/uL (ref 150–450)
RBC: 5.38 x10E6/uL (ref 4.14–5.80)
RDW: 13 % (ref 11.6–15.4)
WBC: 7 10*3/uL (ref 3.4–10.8)

## 2023-10-03 LAB — TSH: TSH: 1.62 u[IU]/mL (ref 0.450–4.500)

## 2023-10-03 LAB — VITAMIN D 25 HYDROXY (VIT D DEFICIENCY, FRACTURES): Vit D, 25-Hydroxy: 26.4 ng/mL — ABNORMAL LOW (ref 30.0–100.0)

## 2023-11-08 ENCOUNTER — Ambulatory Visit: Payer: Medicare Other | Admitting: Dermatology

## 2023-11-20 DIAGNOSIS — Z1331 Encounter for screening for depression: Secondary | ICD-10-CM | POA: Diagnosis not present

## 2023-11-20 DIAGNOSIS — I480 Paroxysmal atrial fibrillation: Secondary | ICD-10-CM | POA: Diagnosis not present

## 2023-11-20 DIAGNOSIS — E538 Deficiency of other specified B group vitamins: Secondary | ICD-10-CM | POA: Diagnosis not present

## 2023-11-20 DIAGNOSIS — M545 Low back pain, unspecified: Secondary | ICD-10-CM | POA: Diagnosis not present

## 2023-11-20 DIAGNOSIS — G20A1 Parkinson's disease without dyskinesia, without mention of fluctuations: Secondary | ICD-10-CM | POA: Diagnosis not present

## 2023-11-23 DIAGNOSIS — E538 Deficiency of other specified B group vitamins: Secondary | ICD-10-CM | POA: Diagnosis not present

## 2023-11-27 ENCOUNTER — Encounter: Payer: Self-pay | Admitting: Physical Therapy

## 2023-11-27 ENCOUNTER — Ambulatory Visit: Attending: Neurology | Admitting: Physical Therapy

## 2023-11-27 DIAGNOSIS — M5459 Other low back pain: Secondary | ICD-10-CM | POA: Diagnosis not present

## 2023-11-27 DIAGNOSIS — R262 Difficulty in walking, not elsewhere classified: Secondary | ICD-10-CM | POA: Diagnosis not present

## 2023-11-27 NOTE — Therapy (Addendum)
 OUTPATIENT PHYSICAL THERAPY THORACOLUMBAR EVALUATION   Patient Name: Daniel [name] MRN: [id number] DOB:[birth year], 73 y.o., male Today's Date: 11/27/2023  END OF SESSION:  PT End of Session - 11/27/23 1223     Visit Number 1    Number of Visits 24    Date for PT Re-Evaluation 02/19/24    Authorization Type [id number]    Authorization - Visit Number 1    Authorization - Number of Visits 24    Progress Note Due on Visit 10    PT Start Time 0900    PT Stop Time 0945    PT Time Calculation (min) 45 min    Activity Tolerance Patient limited by pain    Behavior During Therapy [facility] for tasks assessed/performed          Past Medical History:  Diagnosis Date   Actinic keratosis    Allergy FROM CHILDHOOD   Arthritis    Asthma    COVID-19 01/03/2023   Eczema    GERD (gastroesophageal reflux disease)    Hx of dysplastic nevus 06/06/2006   Mid lower back, slight atypia   Hypothyroidism    Seasonal allergies    Thyroid  disease    Past Surgical History:  Procedure Laterality Date   CATARACT EXTRACTION     COLON SURGERY     COLONOSCOPY WITH PROPOFOL  N/A 11/18/2014   Procedure: COLONOSCOPY WITH PROPOFOL ;  Surgeon: Lamar ONEIDA Holmes, MD;  Location: [facility] ENDOSCOPY;  Service: Endoscopy;  Laterality: N/A;   COLONOSCOPY WITH PROPOFOL  N/A 12/13/2021   Procedure: COLONOSCOPY WITH PROPOFOL ;  Surgeon: Maryruth Ole ONEIDA, MD;  Location: [facility] ENDOSCOPY;  Service: Endoscopy;  Laterality: N/A;   EYE SURGERY     KNEE SURGERY     NASAL SEPTUM SURGERY     Patient Active Problem List   Diagnosis Date Noted   Parkinson's disease (HCC) 09/26/2022   Chronic idiopathic constipation 06/05/2022   Paroxysmal atrial fibrillation (HCC) 03/28/2022   Spondylolysis, lumbar region 03/28/2022   Avitaminosis D 03/28/2022   Myalgia due to statin 03/28/2022   Acquired thrombophilia (HCC) 03/28/2022   Hyperlipidemia 03/28/2022   Abnormal gait 03/28/2022   Mild intermittent asthma without complication  03/28/2022   Zenker's (hypopharyngeal) diverticulum 03/22/2018   Gastroesophageal reflux disease 03/22/2018   Fatty liver 10/09/2017   BPH (benign prostatic hyperplasia) 11/21/2014   Hypothyroidism 11/21/2014   Inguinal hernia 11/21/2014   OA (osteoarthritis) 11/21/2014   H/O adenomatous polyp of colon 10/14/2014    PCP: Dr. Jon Eva    REFERRING PROVIDER: Dr. Jannett Fairly   REFERRING DIAG: [phone] (ICD-10-CM) - DDD (degenerative disc disease), lumbar  Rationale for Evaluation and Treatment: Rehabilitation  THERAPY DIAG:  Other low back pain  Difficulty in walking, not elsewhere classified  ONSET DATE: 3+ years ago (around time of COVID)    SUBJECTIVE:  SUBJECTIVE STATEMENT: See pertinent history   PERTINENT HISTORY:  Pt reports that he started to experience low back pain a couple of years ago when he was walking during the time of COVID. Pain starts around the lower right hip and then spreads to his shoulder blades as the day goes on or the longer he is on his feet. He usually wears a back brace, which helps some. He is walking two blocks every day to build back to walking two miles per day. Patient was walking two miles a day for 5 out of 7 days per week and he was doing this up until this time of COVID. He feels that his balance is ok and that he has not had any falls in the past 6 months and he carries walking stick with him. He also has increased right knee pain that is due to OA and that he has received steroid joint injections which have helped in the past.   PAIN:  Are you having pain? Yes: NPRS scale: 1-2/10 (currently when sitting), 10/10 with standing or walking long periods of time   Pain location: Right SIJ and radiates up to central spine up until thoracic spine Pain  description: Achy  Aggravating factors: Standing or walking for long periods of time   Relieving factors: Prednisone  for   PRECAUTIONS: None  RED FLAGS: None   WEIGHT BEARING RESTRICTIONS: No  FALLS:  Has patient fallen in last 6 months? No  LIVING ENVIRONMENT: Lives with: lives alone Lives in: House/apartment Stairs: Yes: External: 5 steps; on right going up, on left going up, and can reach both Has following equipment at home: Single point cane and wears life alert monitor    OCCUPATION: Retired, likes to Agricultural consultant at [employer]    PLOF: Independent  PATIENT GOALS: Control pain so that he can return to walking longer distances and standing for longer periods of time to cook again.   NEXT MD VISIT: 9 months; March 2026    OBJECTIVE:  Note: Objective measures were completed at Evaluation unless otherwise noted.  VITALS: BP 116/74 HR 66 SpO2 100%  DIAGNOSTIC FINDINGS:  CLINICAL DATA:  Low back pain, worse with standing. Symptoms over the last year.   EXAM: MRI LUMBAR SPINE WITHOUT CONTRAST   TECHNIQUE: Multiplanar, multisequence MR imaging of the lumbar spine was performed. No intravenous contrast was administered.   COMPARISON:  Radiography 11/30/2020   FINDINGS: Segmentation:  5 lumbar type vertebral bodies.   Alignment:  No malalignment.   Vertebrae: Benign appearing hemangioma within the T12 vertebral body. Discogenic endplate marrow changes throughout the lumbar region, with some active edema at L2-3, L3-4 and L4-5, which could correlate with localized back pain.   Conus medullaris and cauda equina: Conus extends to the L1-2 level. Conus and cauda equina appear normal.   Paraspinal and other soft tissues: Negative   Disc levels:   T11-12: Mild bulging of the disc.  No compressive stenosis.   T12-L1: Unremarkable interspace.   L1-2: Shallow protrusion of the disc with a tiny amount caudally migrated disc material to the left of midline.  Minimal indentation of the thecal sac. No compressive stenosis.   L2-3: Loss of disc height. Endplate osteophytes and bulging of the disc more prominent towards the left. Mild stenosis of the left lateral recess that could possibly cause left-sided neural compression. Discogenic endplate edema which could relate to back pain.   L3-4: Loss of disc height. Endplate osteophytes and minimal bulging of the  disc. Mild facet hypertrophy on the left. Mild left foraminal stenosis.   L4-5: Loss of disc height. Endplate osteophytes and bulging of the disc. Facet and ligamentous hypertrophy. Mild stenosis of the lateral recesses and foramina but without visible neural compression. Discogenic endplate edema which could relate to back pain   L5-S1: Disc desiccation with mild loss of height. Endplate osteophytes and mild bulging of the disc. No compressive narrowing of the canal or foramina.   IMPRESSION: Degenerative disc disease throughout the lumbar region with loss of disc height. No apparent compressive stenosis of the canal. Discogenic endplate marrow changes without active edema at L2-3, L3-4 and L4-5, which could correlate with regional axial back pain.   Left lateral recess and foraminal narrowing at L2-3, left foraminal narrowing at L3-4, and bilateral lateral recess and foraminal narrowing at L4-5. None of the stenoses are severe or are seen to result in definite neural compression.   Shallow left posterolateral disc herniation at L1-2 with caudal migration of a tiny amount of disc material to the left of midline. This does not appear to cause any significant compressive effect upon the neural structures.     Electronically Signed   By: Oneil Officer M.D.   On: 07/14/2021 23:01  PATIENT SURVEYS:  Modified Oswestry:  MODIFIED OSWESTRY DISABILITY SCALE  Date: 11/27/23 Score  Pain intensity 3 =  Pain medication provides me with moderate relief from pain.  2. Personal care  (washing, dressing, etc.) 1 =  I can take care of myself normally, but it increases my pain.  3. Lifting 3 = Pain prevents me from lifting heavy weights, but I can manage (5) I have hardly any social life because of my pain. light to medium weights if they are conveniently positioned  4. Walking 1 = Pain prevents me from walking more than 1 mile.  5. Sitting 2 =  Pain prevents me from sitting more than 1 hour.  6. Standing 2 =  Pain prevents me from standing more than 1 hour  7. Sleeping 0 = Pain does not prevent me from sleeping well.  8. Social Life 2 = Pain prevents me from participating in more energetic activities (eg. sports, dancing).  9. Traveling 1 =  I can travel anywhere, but it increases my pain.  10. Employment/ Homemaking 1 = My normal homemaking/job activities increase my pain, but I can still perform all that is required of me  Total 16/50= 32%   Interpretation of scores: Score Category Description  0-20% Minimal Disability The patient can cope with most living activities. Usually no treatment is indicated apart from advice on lifting, sitting and exercise  21-40% Moderate Disability The patient experiences more pain and difficulty with sitting, lifting and standing. Travel and social life are more difficult and they may be disabled from work. Personal care, sexual activity and sleeping are not grossly affected, and the patient can usually be managed by conservative means  41-60% Severe Disability Pain remains the main problem in this group, but activities of daily living are affected. These patients require a detailed investigation  61-80% Crippled Back pain impinges on all aspects of the patient's life. Positive intervention is required  81-100% Bed-bound  These patients are either bed-bound or exaggerating their symptoms  Bluford FORBES Zoe DELENA Karon DELENA, et al. Surgery versus conservative management of stable thoracolumbar fracture: the PRESTO feasibility RCT. [place] ([place]):  [employer]; 2021 Nov. [facility] [employer] Assessment, No. 25.62.) Appendix 3, Oswestry Disability Index category descriptors.  Available from: [employer]el[employer]cz  Minimally Clinically Important Difference (MCID) = 12.8%  COGNITION: Overall cognitive status: Within functional limits for tasks assessed     SENSATION: WFL  MUSCLE LENGTH: Hamstrings: Right 80 deg; Left 80 deg Ely's: Positive Bilateral   POSTURE: rounded shoulders and forward head  PALPATION: Right glute med    LUMBAR ROM:   AROM eval  Flexion 100%  Extension 75%*  Right lateral flexion 100%*  Left lateral flexion 100%  Right rotation 100%  Left rotation 100%   (Blank rows = not tested)  LOWER EXTREMITY ROM:     Active  Right eval Left eval  Hip flexion    Hip extension    Hip abduction    Hip adduction    Hip internal rotation    Hip external rotation    Knee flexion    Knee extension    Ankle dorsiflexion    Ankle plantarflexion    Ankle inversion    Ankle eversion     (Blank rows = not tested)  LOWER EXTREMITY MMT:    MMT Right eval Left eval  Hip flexion 4 4  Hip extension 3 3  Hip abduction 4 4  Hip adduction 4 4  Hip internal rotation 4 4  Hip external rotation 4 4  Knee flexion 4 4  Knee extension 4 4  Ankle dorsiflexion 4+ 4+  Ankle plantarflexion    Ankle inversion    Ankle eversion     (Blank rows = not tested)  LUMBAR SPECIAL TESTS:  Straight leg raise test: Negative, SI Compression/distraction test: Negative, FABER test: Positive Bilateral , and FADIR + RLE   FUNCTIONAL TESTS:  6 minute walk test: NT  10 meter walk test: NT   GAIT:  Not assessed    TREATMENT DATE:   11/27/23:                                                                                                                              Lower Trunk Rotation 2 x 10  Double Knee to Chest  x 5 with 5 sec hold   Trunk Forward Flexion with hip Extension 2 x 10   -mod VC  to maintain extended knee and to not initiate movement with trunk and to keep trunk from extending and flexing for hip extensor isolation    PATIENT EDUCATION:  Education details: Form and technique for correct performance of exercise and explanation about directional pattern for movement Person educated: Patient Education method: Explanation, Demonstration, Verbal cues, and Handouts Education comprehension: verbalized understanding, returned demonstration, and verbal cues required  HOME EXERCISE PROGRAM: Access Code: [email or web address] URL: [email or web address] Date: 11/27/2023 Prepared by: Toribio Servant  Exercises - Supine Lower Trunk Rotation  - 1 x daily - 7 x weekly - 2 sets - 10 reps - 3 sec hold - Plank with Hip Extension on Counter  - 3-4 x weekly - 3 sets - 10 reps  ASSESSMENT:  CLINICAL  IMPRESSION: Patient is a 73 y.o. white male who was seen today for physical therapy evaluation and treatment for chronic right sided low back pain. He has signs and symptoms that make him appropriate for the movement control treatment based classification group with moderate pain and low disability. He has a flexion based directional pattern with pain elicited by lumbar extension and right side bending indicating a possible combination of lumbar spinal arthritis and stenosis and mechanical pain. His deficits include decreased lumbar and hip ROM, decreased hip strength, increased low back pain, decreased mobility and activity tolerance, and increased postural kyphosis. 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   OBJECTIVE IMPAIRMENTS: decreased knowledge of condition, difficulty walking, decreased ROM, decreased strength, hypomobility, impaired flexibility, postural  dysfunction, and pain.   ACTIVITY LIMITATIONS: carrying, lifting, bending, sitting, standing, squatting, stairs, bathing, hygiene/grooming, and locomotion level  PARTICIPATION LIMITATIONS: cleaning, laundry, shopping, community activity, and yard work  PERSONAL FACTORS: Age, Time since onset of injury/illness/exacerbation, and 3+ comorbidities: Parkinson's disease, Hypothyroidism, h/o lumbar spondylosis   are also affecting patient's functional outcome.   REHAB POTENTIAL: Fair chronicity of condition and repeated past bouts of physical therapy    CLINICAL DECISION MAKING: Stable/uncomplicated  EVALUATION COMPLEXITY: Low   GOALS: Goals reviewed with patient? No  SHORT TERM GOALS: Target date: 12/11/2023  Patient will demonstrate undestanding of home exercise plan by performing exercises correctly with evidence of good carry over with min to no verbal or tactile cues .   Baseline:  Goal status: INITIAL     LONG TERM GOALS: Target date: 02/19/2024  Patient will improve modified Oswestry Disability Index (MODI) score by >=13% as evidence of the minimal statistically significant change for improvement with low back pain disability and improvement in low back function (Copay et al, 2008) Baseline: 16/50 (32%) Goal status: INITIAL  2.  Patient will improve hip strength by 1/3 grade MMT (I.E 4- to 4+) and abdominal strength by >= 1 grade for improved lumbar stability and to assist low back in absorbing mechanical forces in weight bearing positions for improved symptom response. Baseline: Hip Flex  R/L 4/4 , Hip Ext R/L 3/3, Hip Abd  R/L 4/4, Saharmann test not performed   Goal status: INITIAL  3. Patient will be able to ambulate for 6 minutes without needing a standing or seated rest break due to pain and he will improve his distance by >=82 meters (269 ft) as evidence of an improved minimal detectable change or progress in aerobic endurance and lumbar function (Steffy and Seney 2008).   Baseline: NT  Goal status: INITIAL    PLAN:  PT FREQUENCY: 1-2x/week  PT DURATION: 12 weeks  PLANNED INTERVENTIONS: 97164- PT Re-evaluation, 97750- Physical Performance Testing, 97110-Therapeutic exercises, 97530- Therapeutic activity, 97112- Neuromuscular re-education, 97535- Self Care, [id number]- Manual therapy, [phone]- Gait training, [phone]- Aquatic Therapy, [phone]- Electrical stimulation (unattended), [phone]- Electrical stimulation (manual), [id number]- Traction (mechanical), 20560 (1-2 muscles), 20561 (3+ muscles)- Dry Needling, Patient/Family education, Balance training, Stair training, Taping, Joint mobilization, Joint manipulation, Spinal manipulation, Spinal mobilization, Vestibular training, DME instructions, Cryotherapy, and Moist heat.  PLAN FOR NEXT SESSION: and . Attempt valsalva to see if this relieves pain. Sahrmann Test. Continue with flexion based strengthening exercises: modified bird dog. Look into axial mobility exercises for hips and low  back specific to PD. Hip Ext MMT in supine to determine whether pain and stenosis were limiting hip ext strength. Rigidity testing especially on right side of body   Toribio Servant PT, DPT  [facility] [address] [place], [address] Phone: [phone]   Fax:  [phone]

## 2023-11-29 ENCOUNTER — Ambulatory Visit: Admitting: Physical Therapy

## 2023-11-29 DIAGNOSIS — M5459 Other low back pain: Secondary | ICD-10-CM

## 2023-11-29 DIAGNOSIS — E538 Deficiency of other specified B group vitamins: Secondary | ICD-10-CM | POA: Diagnosis not present

## 2023-11-29 DIAGNOSIS — R262 Difficulty in walking, not elsewhere classified: Secondary | ICD-10-CM | POA: Diagnosis not present

## 2023-11-29 NOTE — Therapy (Signed)
 OUTPATIENT PHYSICAL THERAPY THORACOLUMBAR TREATMENT   Patient Name: Daniel [name] MRN: [id number] DOB:[birth year], 73 y.o., male Today's Date: 11/29/2023  END OF SESSION:  PT End of Session - 11/29/23 0818     Visit Number 2    Number of Visits 24    Date for PT Re-Evaluation 02/19/24    Authorization Type [id number]    Authorization - Visit Number 2    Authorization - Number of Visits 24    Progress Note Due on Visit 10    PT Start Time 0815    PT Stop Time 0900    PT Time Calculation (min) 45 min    Activity Tolerance Patient limited by pain    Behavior During Therapy [facility] for tasks assessed/performed          Past Medical History:  Diagnosis Date   Actinic keratosis    Allergy FROM CHILDHOOD   Arthritis    Asthma    COVID-19 01/03/2023   Eczema    GERD (gastroesophageal reflux disease)    Hx of dysplastic nevus 06/06/2006   Mid lower back, slight atypia   Hypothyroidism    Seasonal allergies    Thyroid  disease    Past Surgical History:  Procedure Laterality Date   CATARACT EXTRACTION     COLON SURGERY     COLONOSCOPY WITH PROPOFOL  N/A 11/18/2014   Procedure: COLONOSCOPY WITH PROPOFOL ;  Surgeon: Lamar ONEIDA Holmes, MD;  Location: [facility] ENDOSCOPY;  Service: Endoscopy;  Laterality: N/A;   COLONOSCOPY WITH PROPOFOL  N/A 12/13/2021   Procedure: COLONOSCOPY WITH PROPOFOL ;  Surgeon: Maryruth Ole ONEIDA, MD;  Location: [facility] ENDOSCOPY;  Service: Endoscopy;  Laterality: N/A;   EYE SURGERY     KNEE SURGERY     NASAL SEPTUM SURGERY     Patient Active Problem List   Diagnosis Date Noted   Parkinson's disease (HCC) 09/26/2022   Chronic idiopathic constipation 06/05/2022   Paroxysmal atrial fibrillation (HCC) 03/28/2022   Spondylolysis, lumbar region 03/28/2022   Avitaminosis D 03/28/2022   Myalgia due to statin 03/28/2022   Acquired thrombophilia (HCC) 03/28/2022   Hyperlipidemia 03/28/2022   Abnormal gait 03/28/2022   Mild intermittent asthma without complication  03/28/2022   Zenker's (hypopharyngeal) diverticulum 03/22/2018   Gastroesophageal reflux disease 03/22/2018   Fatty liver 10/09/2017   BPH (benign prostatic hyperplasia) 11/21/2014   Hypothyroidism 11/21/2014   Inguinal hernia 11/21/2014   OA (osteoarthritis) 11/21/2014   H/O adenomatous polyp of colon 10/14/2014    PCP: Dr. Jon Eva    REFERRING PROVIDER: Dr. Jannett Fairly   REFERRING DIAG: [id number] (ICD-10-CM) - DDD (degenerative disc disease), lumbar  Rationale for Evaluation and Treatment: Rehabilitation  THERAPY DIAG:  Other low back pain  Difficulty in walking, not elsewhere classified  ONSET DATE: 3+ years ago (around time of COVID)    SUBJECTIVE:  SUBJECTIVE STATEMENT: Pt reports increased pain at the start of the session.   PERTINENT HISTORY:  Pt reports that he started to experience low back pain a couple of years ago when he was walking during the time of COVID. Pain starts around the lower right hip and then spreads to his shoulder blades as the day goes on or the longer he is on his feet. He usually wears a back brace, which helps some. He is walking two blocks every day to build back to walking two miles per day. Patient was walking two miles a day for 5 out of 7 days per week and he was doing this up until this time of COVID. He feels that his balance is ok and that he has not had any falls in the past 6 months and he carries walking stick with him. He also has increased right knee pain that is due to OA and that he has received steroid joint injections which have helped in the past.   PAIN:  Are you having pain? Yes: NPRS scale: 1-2/10 (currently when sitting), 10/10 with standing or walking long periods of time   Pain location: Right SIJ and radiates up to central spine up  until thoracic spine Pain description: Achy  Aggravating factors: Standing or walking for long periods of time   Relieving factors: Prednisone  for   PRECAUTIONS: None  RED FLAGS: None   WEIGHT BEARING RESTRICTIONS: No  FALLS:  Has patient fallen in last 6 months? No  LIVING ENVIRONMENT: Lives with: lives alone Lives in: House/apartment Stairs: Yes: External: 5 steps; on right going up, on left going up, and can reach both Has following equipment at home: Single point cane and wears life alert monitor    OCCUPATION: Retired, likes to Agricultural consultant at [employer]    PLOF: Independent  PATIENT GOALS: Control pain so that he can return to walking longer distances and standing for longer periods of time to cook again.   NEXT MD VISIT: 9 months; March 2026    OBJECTIVE:  Note: Objective measures were completed at Evaluation unless otherwise noted.  VITALS: BP 116/74 HR 66 SpO2 100%  DIAGNOSTIC FINDINGS:  CLINICAL DATA:  Low back pain, worse with standing. Symptoms over the last year.   EXAM: MRI LUMBAR SPINE WITHOUT CONTRAST   TECHNIQUE: Multiplanar, multisequence MR imaging of the lumbar spine was performed. No intravenous contrast was administered.   COMPARISON:  Radiography 11/30/2020   FINDINGS: Segmentation:  5 lumbar type vertebral bodies.   Alignment:  No malalignment.   Vertebrae: Benign appearing hemangioma within the T12 vertebral body. Discogenic endplate marrow changes throughout the lumbar region, with some active edema at L2-3, L3-4 and L4-5, which could correlate with localized back pain.   Conus medullaris and cauda equina: Conus extends to the L1-2 level. Conus and cauda equina appear normal.   Paraspinal and other soft tissues: Negative   Disc levels:   T11-12: Mild bulging of the disc.  No compressive stenosis.   T12-L1: Unremarkable interspace.   L1-2: Shallow protrusion of the disc with a tiny amount caudally migrated disc  material to the left of midline. Minimal indentation of the thecal sac. No compressive stenosis.   L2-3: Loss of disc height. Endplate osteophytes and bulging of the disc more prominent towards the left. Mild stenosis of the left lateral recess that could possibly cause left-sided neural compression. Discogenic endplate edema which could relate to back pain.   L3-4: Loss of disc height.  Endplate osteophytes and minimal bulging of the disc. Mild facet hypertrophy on the left. Mild left foraminal stenosis.   L4-5: Loss of disc height. Endplate osteophytes and bulging of the disc. Facet and ligamentous hypertrophy. Mild stenosis of the lateral recesses and foramina but without visible neural compression. Discogenic endplate edema which could relate to back pain   L5-S1: Disc desiccation with mild loss of height. Endplate osteophytes and mild bulging of the disc. No compressive narrowing of the canal or foramina.   IMPRESSION: Degenerative disc disease throughout the lumbar region with loss of disc height. No apparent compressive stenosis of the canal. Discogenic endplate marrow changes without active edema at L2-3, L3-4 and L4-5, which could correlate with regional axial back pain.   Left lateral recess and foraminal narrowing at L2-3, left foraminal narrowing at L3-4, and bilateral lateral recess and foraminal narrowing at L4-5. None of the stenoses are severe or are seen to result in definite neural compression.   Shallow left posterolateral disc herniation at L1-2 with caudal migration of a tiny amount of disc material to the left of midline. This does not appear to cause any significant compressive effect upon the neural structures.     Electronically Signed   By: Oneil Officer M.D.   On: 07/14/2021 23:01  PATIENT SURVEYS:  Modified Oswestry:  MODIFIED OSWESTRY DISABILITY SCALE  Date: 11/27/23 Score  Pain intensity 3 =  Pain medication provides me with moderate relief  from pain.  2. Personal care (washing, dressing, etc.) 1 =  I can take care of myself normally, but it increases my pain.  3. Lifting 3 = Pain prevents me from lifting heavy weights, but I can manage (5) I have hardly any social life because of my pain. light to medium weights if they are conveniently positioned  4. Walking 1 = Pain prevents me from walking more than 1 mile.  5. Sitting 2 =  Pain prevents me from sitting more than 1 hour.  6. Standing 2 =  Pain prevents me from standing more than 1 hour  7. Sleeping 0 = Pain does not prevent me from sleeping well.  8. Social Life 2 = Pain prevents me from participating in more energetic activities (eg. sports, dancing).  9. Traveling 1 =  I can travel anywhere, but it increases my pain.  10. Employment/ Homemaking 1 = My normal homemaking/job activities increase my pain, but I can still perform all that is required of me  Total 16/50= 32%   Interpretation of scores: Score Category Description  0-20% Minimal Disability The patient can cope with most living activities. Usually no treatment is indicated apart from advice on lifting, sitting and exercise  21-40% Moderate Disability The patient experiences more pain and difficulty with sitting, lifting and standing. Travel and social life are more difficult and they may be disabled from work. Personal care, sexual activity and sleeping are not grossly affected, and the patient can usually be managed by conservative means  41-60% Severe Disability Pain remains the main problem in this group, but activities of daily living are affected. These patients require a detailed investigation  61-80% Crippled Back pain impinges on all aspects of the patient's life. Positive intervention is required  81-100% Bed-bound  These patients are either bed-bound or exaggerating their symptoms  Bluford FORBES Zoe DELENA Karon DELENA, et al. Surgery versus conservative management of stable thoracolumbar fracture: the PRESTO  feasibility RCT. [place]): [employer]; 2021 Nov. [facility] Technology Assessment, No. 25.62.)  Appendix 3, Oswestry Disability Index category descriptors. Available from: FindJewel[employer].cz  Minimally Clinically Important Difference (MCID) = 12.8%  COGNITION: Overall cognitive status: Within functional limits for tasks assessed     SENSATION: WFL  MUSCLE LENGTH: Hamstrings: Right 80 deg; Left 80 deg Ely's: Positive Bilateral   POSTURE: rounded shoulders and forward head  PALPATION: Right glute med    LUMBAR ROM:   AROM eval  Flexion 100%  Extension 75%*  Right lateral flexion 100%*  Left lateral flexion 100%  Right rotation 100%  Left rotation 100%   (Blank rows = not tested)  LOWER EXTREMITY ROM:     Active  Right eval Left eval  Hip flexion    Hip extension    Hip abduction    Hip adduction    Hip internal rotation    Hip external rotation    Knee flexion    Knee extension    Ankle dorsiflexion    Ankle plantarflexion    Ankle inversion    Ankle eversion     (Blank rows = not tested)  LOWER EXTREMITY MMT:    MMT Right eval Left eval  Hip flexion 4 4  Hip extension 3 3  Hip abduction 4 4  Hip adduction 4 4  Hip internal rotation 4 4  Hip external rotation 4 4  Knee flexion 4 4  Knee extension 4 4  Ankle dorsiflexion 4+ 4+  Ankle plantarflexion    Ankle inversion    Ankle eversion     (Blank rows = not tested)  LUMBAR SPECIAL TESTS:  Straight leg raise test: Negative, SI Compression/distraction test: Negative, FABER test: Positive Bilateral , and FADIR + RLE   FUNCTIONAL TESTS:  6 minute walk test: NT  10 meter walk test: NT   GAIT:  Not assessed    TREATMENT DATE:   11/29/23: THEREX : 1,400 ft  -RPE 2-3/10 , Vitals  SpO2 100 , BP 138/86 HR 75  10 Meter Walk Test   Gait Speed:    1st trial 8.61 sec , 2nd trial 9.35, Avg=  8.98 sec - Normal Gait speed Avg=   1.11 m/sec   >=1 m/sec  [employer] and cross street safely and normal WS <1 m/sec Need Intervention to reduce falls risk  0.12 m/sec improvement represents a statistically significant improvement in gait speed   - 0.8 - 1.3 m/sec - community ambulator - associated with increased independence in self-care Supine Hip Ext R/L 3/3  Supine Shoulder Rotation 2 x 10 -min VC to allow time to stretch at end range of motion    Cat Cow 2 x 10 with 5 sec hold   Sit to Stand at 18 inch mat height with 1 x 5   Sit to Stand at  18 inch with emphasis on quick standing and slow sitting  1 x 10  -Pt reports increased right knee medial pain   Sit to Stand at 22 inch mat height with emphasis on quick standing and slow sitting  1 x 10  emphasis on quick standing and slow sitting  1 x 10  NRPS 4/10  Modified Bird Dog 2 x 10   -min VC to stop abduction to side   Modified Standing Hip Ext 2 x 10  Modified Standing Hip Ext with red band  2 x 10  Modified Standing Hip Ext with red band around ankles 2 x 10    PATIENT EDUCATION:  Education details: Form and technique for correct performance of  exercise and explanation about directional pattern for movement Person educated: Patient Education method: Explanation, Demonstration, Verbal cues, and Handouts Education comprehension: verbalized understanding, returned demonstration, and verbal cues required  HOME EXERCISE PROGRAM: Access Code: [email or web address] URL: [email or web address] Date: 11/29/2023 Prepared by: Toribio Servant  Exercises - Supine Lower Trunk Rotation  - 1 x daily - 7 x weekly - 2 sets - 10 reps - 3 sec hold - Supine Shoulder External and Internal Rotation in Abduction with Dumbbell  - 1 x daily - 7 x weekly - 2 sets - 10 reps - 3-5 sec  hold - Cat Cow  - 1 x daily - 7 x weekly - 2 sets - 10 reps - 3 sec  hold - Plank with Hip Extension on Counter  - 3-4 x weekly - 3 sets - 10 reps  ASSESSMENT:  CLINICAL IMPRESSION: Pt presents for initial treatment  for chronic low back pain. He is not limited by pain with aerobic endurance and he is ambulates community level distances and he was hemodynamically stable. He was limited by medial right knee pain during sit to stands, so exercise modified to include decreased knee flexion for hip strengthening. He continues to exhibit hip extension weakness even in supine position indicating that stenosis or back pain is not limiting hip strength and that he actually has weakness. 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    OBJECTIVE IMPAIRMENTS: decreased knowledge of condition, difficulty walking, decreased ROM, decreased strength, hypomobility, impaired flexibility, postural dysfunction, and pain.   ACTIVITY LIMITATIONS: carrying, lifting, bending, sitting, standing, squatting, stairs, bathing, hygiene/grooming, and locomotion level  PARTICIPATION LIMITATIONS: cleaning, laundry, shopping, community activity, and yard work  PERSONAL FACTORS: Age, Time since onset of injury/illness/exacerbation, and 3+ comorbidities: Parkinson's disease, Hypothyroidism, h/o lumbar spondylosis   are also affecting patient's functional outcome.   REHAB POTENTIAL: Fair chronicity of condition and repeated past bouts of physical therapy    CLINICAL DECISION MAKING: Stable/uncomplicated  EVALUATION COMPLEXITY: Low   GOALS: Goals reviewed with patient? No  SHORT TERM GOALS: Target date: 12/11/2023  Patient will demonstrate undestanding of home exercise plan by performing exercises correctly with evidence of good carry over with min to no verbal or tactile cues .   Baseline: NT  Goal status: ONGOING      LONG TERM GOALS: Target date: 02/19/2024  Patient will improve modified Oswestry  Disability Index (MODI) score by >=13% as evidence of the minimal statistically significant change for improvement with low back pain disability and improvement in low back function (Copay et al, 2008) Baseline: 16/50 (32%) Goal status: ONGOING   2.  Patient will improve hip strength by 1/3 grade MMT (I.E 4- to 4+) and abdominal strength by >= 1 grade for improved lumbar stability and to assist low back in absorbing mechanical forces in weight bearing positions for improved symptom response. Baseline: Hip Flex  R/L 4/4 , Hip Ext R/L 3/3, Hip Abd  R/L 4/4, Saharmann test not performed   Goal status: ONGOING   3. Patient will be able to ambulate for 6 minutes without needing a standing or seated rest break due to pain and he will improve his distance by >=82 meters (269 ft) as evidence of an improved minimal detectable change or progress in aerobic endurance and  lumbar function (Steffy and Seney 2008).  Baseline: 1,400 ft  Goal status: ONGOING     PLAN:  PT FREQUENCY: 1-2x/week  PT DURATION: 12 weeks  PLANNED INTERVENTIONS: 97164- PT Re-evaluation, 97750- Physical Performance Testing, 97110-Therapeutic exercises, 97530- Therapeutic activity, 97112- Neuromuscular re-education, 97535- Self Care, 02859- Manual therapy, [phone]- Gait training, [phone]- Aquatic Therapy, [phone]- Electrical stimulation (unattended), [phone]- Electrical stimulation (manual), [id number]- Traction (mechanical), 20560 (1-2 muscles), 20561 (3+ muscles)- Dry Needling, Patient/Family education, Balance training, Stair training, Taping, Joint mobilization, Joint manipulation, Spinal manipulation, Spinal mobilization, Vestibular training, DME instructions, Cryotherapy, and Moist heat.  PLAN FOR NEXT SESSION:  Attempt valsalva to see if this relieves pain. Sahrmann Test. Continue with flexion based strengthening exercises: modified bird dog. Look into axial mobility exercises for hips and low back specific to PD. Hip Ext MMT in supine to  determine whether pain and stenosis were limiting hip ext strength. Rigidity testing especially on right side of body   Toribio Servant PT, DPT  [facility] [address] [place], [address] Phone: [phone]   Fax:  [phone]

## 2023-12-03 ENCOUNTER — Encounter: Admitting: Physical Therapy

## 2023-12-06 ENCOUNTER — Telehealth: Payer: Self-pay | Admitting: Physical Therapy

## 2023-12-06 ENCOUNTER — Ambulatory Visit: Admitting: Physical Therapy

## 2023-12-06 DIAGNOSIS — M47816 Spondylosis without myelopathy or radiculopathy, lumbar region: Secondary | ICD-10-CM | POA: Diagnosis not present

## 2023-12-06 DIAGNOSIS — Z79899 Other long term (current) drug therapy: Secondary | ICD-10-CM | POA: Diagnosis not present

## 2023-12-06 DIAGNOSIS — M48062 Spinal stenosis, lumbar region with neurogenic claudication: Secondary | ICD-10-CM | POA: Diagnosis not present

## 2023-12-06 DIAGNOSIS — M5416 Radiculopathy, lumbar region: Secondary | ICD-10-CM | POA: Diagnosis not present

## 2023-12-06 NOTE — Telephone Encounter (Signed)
 Pt called back after 20 min from start of his apt time to inquire about when he had an apt today. PT said he had already missed apt time and reminded him of next time at Monday 8:15 am and to get another schedule printed off so he could reference it so he would not get another no show.

## 2023-12-07 DIAGNOSIS — M25561 Pain in right knee: Secondary | ICD-10-CM | POA: Diagnosis not present

## 2023-12-07 DIAGNOSIS — E538 Deficiency of other specified B group vitamins: Secondary | ICD-10-CM | POA: Diagnosis not present

## 2023-12-10 ENCOUNTER — Ambulatory Visit: Admitting: Physical Therapy

## 2023-12-10 DIAGNOSIS — R262 Difficulty in walking, not elsewhere classified: Secondary | ICD-10-CM | POA: Diagnosis not present

## 2023-12-10 DIAGNOSIS — M5459 Other low back pain: Secondary | ICD-10-CM | POA: Diagnosis not present

## 2023-12-10 NOTE — Therapy (Signed)
 OUTPATIENT PHYSICAL THERAPY THORACOLUMBAR TREATMENT   Patient Name: Daniel [name] MRN: [id number] DOB:December 11, 1950, 73 y.o., male Today's Date: 12/10/2023  END OF SESSION:  PT End of Session - 12/10/23 0911     Visit Number 3    Number of Visits 24    Date for PT Re-Evaluation 02/19/24    Authorization Type [employer] [id number]    Authorization - Visit Number 3    Authorization - Number of Visits 24    Progress Note Due on Visit 10    PT Start Time 0815    PT Stop Time 0900    PT Time Calculation (min) 45 min    Activity Tolerance Patient tolerated treatment well    Behavior During Therapy [facility] for tasks assessed/performed           Past Medical History:  Diagnosis Date   Actinic keratosis    Allergy FROM CHILDHOOD   Arthritis    Asthma    COVID-19 01/03/2023   Eczema    GERD (gastroesophageal reflux disease)    Hx of dysplastic nevus 06/06/2006   Mid lower back, slight atypia   Hypothyroidism    Seasonal allergies    Thyroid  disease    Past Surgical History:  Procedure Laterality Date   CATARACT EXTRACTION     COLON SURGERY     COLONOSCOPY WITH PROPOFOL  N/A 11/18/2014   Procedure: COLONOSCOPY WITH PROPOFOL ;  Surgeon: Lamar ONEIDA Holmes, MD;  Location: [facility] ENDOSCOPY;  Service: Endoscopy;  Laterality: N/A;   COLONOSCOPY WITH PROPOFOL  N/A 12/13/2021   Procedure: COLONOSCOPY WITH PROPOFOL ;  Surgeon: Maryruth Ole ONEIDA, MD;  Location: [facility] ENDOSCOPY;  Service: Endoscopy;  Laterality: N/A;   EYE SURGERY     KNEE SURGERY     NASAL SEPTUM SURGERY     Patient Active Problem List   Diagnosis Date Noted   Parkinson's disease (HCC) 09/26/2022   Chronic idiopathic constipation 06/05/2022   Paroxysmal atrial fibrillation (HCC) 03/28/2022   Spondylolysis, lumbar region 03/28/2022   Avitaminosis D 03/28/2022   Myalgia due to statin 03/28/2022   Acquired thrombophilia (HCC) 03/28/2022   Hyperlipidemia 03/28/2022   Abnormal gait 03/28/2022   Mild intermittent asthma without  complication 03/28/2022   Zenker's (hypopharyngeal) diverticulum 03/22/2018   Gastroesophageal reflux disease 03/22/2018   Fatty liver 10/09/2017   BPH (benign prostatic hyperplasia) 11/21/2014   Hypothyroidism 11/21/2014   Inguinal hernia 11/21/2014   OA (osteoarthritis) 11/21/2014   H/O adenomatous polyp of colon 10/14/2014    PCP: Dr. Jon Eva    REFERRING PROVIDER: Dr. Jannett Fairly   REFERRING DIAG: [phone] (ICD-10-CM) - DDD (degenerative disc disease), lumbar  Rationale for Evaluation and Treatment: Rehabilitation  THERAPY DIAG:  Other low back pain  Difficulty in walking, not elsewhere classified  ONSET DATE: 3+ years ago (around time of COVID)    SUBJECTIVE:  SUBJECTIVE STATEMENT: Pt states that he recently had a steroid shout in his right knee which has helped a lot. He also mentions that he has started to walk three blocks which has been going well.   PERTINENT HISTORY:  Pt reports that he started to experience low back pain a couple of years ago when he was walking during the time of COVID. Pain starts around the lower right hip and then spreads to his shoulder blades as the day goes on or the longer he is on his feet. He usually wears a back brace, which helps some. He is walking two blocks every day to build back to walking two miles per day. Patient was walking two miles a day for 5 out of 7 days per week and he was doing this up until this time of COVID. He feels that his balance is ok and that he has not had any falls in the past 6 months and he carries walking stick with him. He also has increased right knee pain that is due to OA and that he has received steroid joint injections which have helped in the past.   PAIN:  Are you having pain? Yes: NPRS scale: 3-4/10 (currently  when sitting), 10/10 with standing or walking long periods of time   Pain location: right greater trochanter  Pain description: Achy  Aggravating factors: Standing or walking for long periods of time   Relieving factors: Prednisone  for   PRECAUTIONS: None  RED FLAGS: None   WEIGHT BEARING RESTRICTIONS: No  FALLS:  Has patient fallen in last 6 months? No  LIVING ENVIRONMENT: Lives with: lives alone Lives in: House/apartment Stairs: Yes: External: 5 steps; on right going up, on left going up, and can reach both Has following equipment at home: Single point cane and wears life alert monitor    OCCUPATION: Retired, likes to Agricultural consultant at [employer]    PLOF: Independent  PATIENT GOALS: Control pain so that he can return to walking longer distances and standing for longer periods of time to cook again.   NEXT MD VISIT: 9 months; March 2026    OBJECTIVE:  Note: Objective measures were completed at Evaluation unless otherwise noted.  VITALS: BP 116/74 HR 66 SpO2 100%  DIAGNOSTIC FINDINGS:  CLINICAL DATA:  Low back pain, worse with standing. Symptoms over the last year.   EXAM: MRI LUMBAR SPINE WITHOUT CONTRAST   TECHNIQUE: Multiplanar, multisequence MR imaging of the lumbar spine was performed. No intravenous contrast was administered.   COMPARISON:  Radiography 11/30/2020   FINDINGS: Segmentation:  5 lumbar type vertebral bodies.   Alignment:  No malalignment.   Vertebrae: Benign appearing hemangioma within the T12 vertebral body. Discogenic endplate marrow changes throughout the lumbar region, with some active edema at L2-3, L3-4 and L4-5, which could correlate with localized back pain.   Conus medullaris and cauda equina: Conus extends to the L1-2 level. Conus and cauda equina appear normal.   Paraspinal and other soft tissues: Negative   Disc levels:   T11-12: Mild bulging of the disc.  No compressive stenosis.   T12-L1: Unremarkable  interspace.   L1-2: Shallow protrusion of the disc with a tiny amount caudally migrated disc material to the left of midline. Minimal indentation of the thecal sac. No compressive stenosis.   L2-3: Loss of disc height. Endplate osteophytes and bulging of the disc more prominent towards the left. Mild stenosis of the left lateral recess that could possibly cause left-sided neural compression.  Discogenic endplate edema which could relate to back pain.   L3-4: Loss of disc height. Endplate osteophytes and minimal bulging of the disc. Mild facet hypertrophy on the left. Mild left foraminal stenosis.   L4-5: Loss of disc height. Endplate osteophytes and bulging of the disc. Facet and ligamentous hypertrophy. Mild stenosis of the lateral recesses and foramina but without visible neural compression. Discogenic endplate edema which could relate to back pain   L5-S1: Disc desiccation with mild loss of height. Endplate osteophytes and mild bulging of the disc. No compressive narrowing of the canal or foramina.   IMPRESSION: Degenerative disc disease throughout the lumbar region with loss of disc height. No apparent compressive stenosis of the canal. Discogenic endplate marrow changes without active edema at L2-3, L3-4 and L4-5, which could correlate with regional axial back pain.   Left lateral recess and foraminal narrowing at L2-3, left foraminal narrowing at L3-4, and bilateral lateral recess and foraminal narrowing at L4-5. None of the stenoses are severe or are seen to result in definite neural compression.   Shallow left posterolateral disc herniation at L1-2 with caudal migration of a tiny amount of disc material to the left of midline. This does not appear to cause any significant compressive effect upon the neural structures.     Electronically Signed   By: Oneil Officer M.D.   On: 07/14/2021 23:01  PATIENT SURVEYS:  Modified Oswestry:  MODIFIED OSWESTRY DISABILITY  SCALE  Date: 11/27/23 Score  Pain intensity 3 =  Pain medication provides me with moderate relief from pain.  2. Personal care (washing, dressing, etc.) 1 =  I can take care of myself normally, but it increases my pain.  3. Lifting 3 = Pain prevents me from lifting heavy weights, but I can manage (5) I have hardly any social life because of my pain. light to medium weights if they are conveniently positioned  4. Walking 1 = Pain prevents me from walking more than 1 mile.  5. Sitting 2 =  Pain prevents me from sitting more than 1 hour.  6. Standing 2 =  Pain prevents me from standing more than 1 hour  7. Sleeping 0 = Pain does not prevent me from sleeping well.  8. Social Life 2 = Pain prevents me from participating in more energetic activities (eg. sports, dancing).  9. Traveling 1 =  I can travel anywhere, but it increases my pain.  10. Employment/ Homemaking 1 = My normal homemaking/job activities increase my pain, but I can still perform all that is required of me  Total 16/50= 32%   Interpretation of scores: Score Category Description  0-20% Minimal Disability The patient can cope with most living activities. Usually no treatment is indicated apart from advice on lifting, sitting and exercise  21-40% Moderate Disability The patient experiences more pain and difficulty with sitting, lifting and standing. Travel and social life are more difficult and they may be disabled from work. Personal care, sexual activity and sleeping are not grossly affected, and the patient can usually be managed by conservative means  41-60% Severe Disability Pain remains the main problem in this group, but activities of daily living are affected. These patients require a detailed investigation  61-80% Crippled Back pain impinges on all aspects of the patient's life. Positive intervention is required  81-100% Bed-bound  These patients are either bed-bound or exaggerating their symptoms  Bluford BRAVO, [place]le[place] DELENA Karon DELENA,  et al. Surgery versus conservative management of stable thoracolumbar fracture:  the PRESTO feasibility RCT. [place]): [employer]; 2021 Nov. [facility] Technology Assessment, No. 25.62.) Appendix 3, Oswestry Disability Index category descriptors. Available from: FindJewelers.cz  Minimally Clinically Important Difference (MCID) = 12.8%  COGNITION: Overall cognitive status: Within functional limits for tasks assessed     SENSATION: WFL  MUSCLE LENGTH: Hamstrings: Right 80 deg; Left 80 deg Ely's: Positive Bilateral   POSTURE: rounded shoulders and forward head  PALPATION: Right glute med    LUMBAR ROM:   AROM eval  Flexion 100%  Extension 75%*  Right lateral flexion 100%*  Left lateral flexion 100%  Right rotation 100%  Left rotation 100%   (Blank rows = not tested)  LOWER EXTREMITY ROM:     Active  Right eval Left eval  Hip flexion    Hip extension    Hip abduction    Hip adduction    Hip internal rotation    Hip external rotation    Knee flexion    Knee extension    Ankle dorsiflexion    Ankle plantarflexion    Ankle inversion    Ankle eversion     (Blank rows = not tested)  LOWER EXTREMITY MMT:    MMT Right eval Left eval  Hip flexion 4 4  Hip extension 3 3  Hip abduction 4 4  Hip adduction 4 4  Hip internal rotation 4 4  Hip external rotation 4 4  Knee flexion 4 4  Knee extension 4 4  Ankle dorsiflexion 4+ 4+  Ankle plantarflexion    Ankle inversion    Ankle eversion     (Blank rows = not tested)  LUMBAR SPECIAL TESTS:  Straight leg raise test: Negative, SI Compression/distraction test: Negative, FABER test: Positive Bilateral , and FADIR + RLE   FUNCTIONAL TESTS:  6 minute walk test: NT  10 meter walk test: NT   GAIT:  Not assessed    TREATMENT DATE:   12/10/23: THEREX Nu-Step seat and arms at 11 for 5 min and resistance at 3   Sahrmann test: -Level 2-Able to Perform  -Level - Able to  Perform  90/90 Alternating Heel Touches 1 x 10  -Pt reports increased central low back    Supine Reaches 2 x 10  -min VC to clear shoulder blades from mat   Forward Lean with Hip Extension with red band for resistance  1 x 10  -Pt reports increased pain in right SIJ   -min VC to increase forward lean  Modified Bird Dog 2 x 10 with red band    -mod VC to perform with alternating shoulder flexion with hip extension  Plantigrade with alternating shoulder flexion 2 x 10     NMR  Sit to Stand Double Chop PNF Shoulder Flexion 1 x 10   -20 inch mat surface, pt reports increased right knee pain where he received shot     Sit to Stand Double Chop PNF Shoulder Flexion 1 x 10  -28 inch mat height  PNF D2 Flexion Double Chop in seated position  1 x 10  PNF D2 Flexion Double Chop in standing position 1 x 10   PATIENT EDUCATION:  Education details: Form and technique for correct performance of exercise and explanation about directional pattern for movement Person educated: Patient Education method: Explanation, Demonstration, Verbal cues, and Handouts Education comprehension: verbalized understanding, returned demonstration, and verbal cues required  HOME EXERCISE PROGRAM: Access Code: [email or web address] URL: [email or web address] Date: 11/29/2023 Prepared by: Toribio Servant  Exercises - Supine  Lower Trunk Rotation  - 1 x daily - 7 x weekly - 2 sets - 10 reps - 3 sec hold - Supine Shoulder External and Internal Rotation in Abduction with Dumbbell  - 1 x daily - 7 x weekly - 2 sets - 10 reps - 3-5 sec  hold - Cat Cow  - 1 x daily - 7 x weekly - 2 sets - 10 reps - 3 sec  hold - Plank with Hip Extension on Counter  - 3-4 x weekly - 3 sets - 10 reps  ASSESSMENT:  CLINICAL IMPRESSION: Pt shows improvement with LE strength and pain tolerance with ability to perform strengthening exercises with increased resistance. He does exhibit abdominal weakness with increased compensation with lumbar  lordosis. PT modified exercise to include focus on power movements and chest opening exercise to counteract affects of PD. He was limited by right knee pain especially with knee flexion exercises, but depth of sit to stand modified to include decreased height, which pt was able to perform.  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    OBJECTIVE IMPAIRMENTS: decreased knowledge of condition, difficulty walking, decreased ROM, decreased strength, hypomobility, impaired flexibility, postural dysfunction, and pain.   ACTIVITY LIMITATIONS: carrying, lifting, bending, sitting, standing, squatting, stairs, bathing, hygiene/grooming, and locomotion level  PARTICIPATION LIMITATIONS: cleaning, laundry, shopping, community activity, and yard work  PERSONAL FACTORS: Age, Time since onset of injury/illness/exacerbation, and 3+ comorbidities: Parkinson's disease, Hypothyroidism, h/o lumbar spondylosis   are also affecting patient's functional outcome.   REHAB POTENTIAL: Fair chronicity of condition and repeated past bouts of physical therapy    CLINICAL DECISION MAKING: Stable/uncomplicated  EVALUATION COMPLEXITY: Low   GOALS: Goals reviewed with patient? No  SHORT TERM GOALS: Target date: 12/11/2023  Patient will demonstrate undestanding of home exercise plan by performing exercises correctly with evidence of good carry over with min to no verbal or tactile cues .   Baseline: NT  12/10/23: Performing independently  Goal status: ACHIEVED        LONG TERM GOALS: Target date: 02/19/2024  Patient will improve modified Oswestry Disability Index (MODI) score by >=13% as evidence of the minimal statistically significant change for improvement with low back pain disability  and improvement in low back function (Copay et al, 2008) Baseline: 16/50 (32%) Goal status: ONGOING   2.  Patient will improve hip strength by 1/3 grade MMT (I.E 4- to 4+) and abdominal strength by >= 1 grade for improved lumbar stability and to assist low back in absorbing mechanical forces in weight bearing positions for improved symptom response. Baseline: Hip Flex  R/L 4/4 , Hip Ext R/L 3/3, Hip Abd  R/L 4/4, Saharmann Level 3     Goal status: ONGOING   3. Patient will be able to ambulate for 6 minutes without needing a standing or seated rest break due to pain and he will improve his distance by >=82 meters (269 ft) as evidence of an improved minimal detectable change or progress in aerobic endurance and lumbar function (Steffy and Seney 2008).  Baseline: 1,400 ft  Goal status: ONGOING     PLAN:  PT FREQUENCY: 1-2x/week  PT DURATION: 12 weeks  PLANNED INTERVENTIONS: 97164- PT Re-evaluation, 97750- Physical Performance Testing, 97110-Therapeutic exercises, 97530- Therapeutic activity, [id number]- Neuromuscular re-education,  02464- Self Care, 02859- Manual therapy, [id number]- Gait training, [phone]- Aquatic Therapy, [phone]- Electrical stimulation (unattended), [phone]- Electrical stimulation (manual), [id number]- Traction (mechanical), [phone] (1-2 muscles), [id number] (3+ muscles)- Dry Needling, Patient/Family education, Balance training, Stair training, Taping, Joint mobilization, Joint manipulation, Spinal manipulation, Spinal mobilization, Vestibular training, DME instructions, Cryotherapy, and Moist heat.  PLAN FOR NEXT SESSION: Axial mobility exercises: prone press ups. Circuit training with hip strengthening and walking.  Hip Ext MMT in supine to determine whether pain and stenosis were limiting hip ext strength. Rigidity testing especially on right side of body   Toribio Servant PT, DPT  [facility] [address], [place], [address] Phone: [phone]    Fax:  [phone]

## 2023-12-12 ENCOUNTER — Ambulatory Visit: Admitting: Physical Therapy

## 2023-12-13 ENCOUNTER — Encounter: Payer: Self-pay | Admitting: Physical Therapy

## 2023-12-13 ENCOUNTER — Ambulatory Visit: Admitting: Physical Therapy

## 2023-12-13 DIAGNOSIS — M5459 Other low back pain: Secondary | ICD-10-CM

## 2023-12-13 DIAGNOSIS — R262 Difficulty in walking, not elsewhere classified: Secondary | ICD-10-CM | POA: Diagnosis not present

## 2023-12-13 NOTE — Therapy (Signed)
 OUTPATIENT PHYSICAL THERAPY THORACOLUMBAR TREATMENT   Patient Name: Daniel [name] MRN: [id number] DOB:04/02/1951, 73 y.o., male Today's Date: 12/13/2023  END OF SESSION:  PT End of Session - 12/13/23 1441     Visit Number 4    Number of Visits 24    Date for PT Re-Evaluation 02/19/24    Authorization Type [id number]    Authorization - Visit Number 4    Authorization - Number of Visits 24    Progress Note Due on Visit 10    PT Start Time 1435    PT Stop Time 1515    PT Time Calculation (min) 40 min    Activity Tolerance Patient tolerated treatment well    Behavior During Therapy [facility] for tasks assessed/performed           Past Medical History:  Diagnosis Date   Actinic keratosis    Allergy FROM CHILDHOOD   Arthritis    Asthma    COVID-19 01/03/2023   Eczema    GERD (gastroesophageal reflux disease)    Hx of dysplastic nevus 06/06/2006   Mid lower back, slight atypia   Hypothyroidism    Seasonal allergies    Thyroid  disease    Past Surgical History:  Procedure Laterality Date   CATARACT EXTRACTION     COLON SURGERY     COLONOSCOPY WITH PROPOFOL  N/A 11/18/2014   Procedure: COLONOSCOPY WITH PROPOFOL ;  Surgeon: Lamar ONEIDA Holmes, MD;  Location: [facility] ENDOSCOPY;  Service: Endoscopy;  Laterality: N/A;   COLONOSCOPY WITH PROPOFOL  N/A 12/13/2021   Procedure: COLONOSCOPY WITH PROPOFOL ;  Surgeon: Maryruth Ole ONEIDA, MD;  Location: [facility] ENDOSCOPY;  Service: Endoscopy;  Laterality: N/A;   EYE SURGERY     KNEE SURGERY     NASAL SEPTUM SURGERY     Patient Active Problem List   Diagnosis Date Noted   Parkinson's disease (HCC) 09/26/2022   Chronic idiopathic constipation 06/05/2022   Paroxysmal atrial fibrillation (HCC) 03/28/2022   Spondylolysis, lumbar region 03/28/2022   Avitaminosis D 03/28/2022   Myalgia due to statin 03/28/2022   Acquired thrombophilia (HCC) 03/28/2022   Hyperlipidemia 03/28/2022   Abnormal gait 03/28/2022   Mild intermittent asthma without  complication 03/28/2022   Zenker's (hypopharyngeal) diverticulum 03/22/2018   Gastroesophageal reflux disease 03/22/2018   Fatty liver 10/09/2017   BPH (benign prostatic hyperplasia) 11/21/2014   Hypothyroidism 11/21/2014   Inguinal hernia 11/21/2014   OA (osteoarthritis) 11/21/2014   H/O adenomatous polyp of colon 10/14/2014    PCP: Dr. Jon Eva    REFERRING PROVIDER: Dr. Jannett Fairly   REFERRING DIAG: [phone] (ICD-10-CM) - DDD (degenerative disc disease), lumbar  Rationale for Evaluation and Treatment: Rehabilitation  THERAPY DIAG:  Other low back pain  Difficulty in walking, not elsewhere classified  ONSET DATE: 3+ years ago (around time of COVID)    SUBJECTIVE:  SUBJECTIVE STATEMENT: Pt states that he felt really good yesterday and he is not sure why. He was able to walk three blocks without difficulty.   PERTINENT HISTORY:  Pt reports that he started to experience low back pain a couple of years ago when he was walking during the time of COVID. Pain starts around the lower right hip and then spreads to his shoulder blades as the day goes on or the longer he is on his feet. He usually wears a back brace, which helps some. He is walking two blocks every day to build back to walking two miles per day. Patient was walking two miles a day for 5 out of 7 days per week and he was doing this up until this time of COVID. He feels that his balance is ok and that he has not had any falls in the past 6 months and he carries walking stick with him. He also has increased right knee pain that is due to OA and that he has received steroid joint injections which have helped in the past.   PAIN:  Are you having pain? Yes: NPRS scale: 3-4/10 (currently when sitting), 10/10 with standing or walking long  periods of time   Pain location: right greater trochanter  Pain description: Achy  Aggravating factors: Standing or walking for long periods of time   Relieving factors: Prednisone  for   PRECAUTIONS: None  RED FLAGS: None   WEIGHT BEARING RESTRICTIONS: No  FALLS:  Has patient fallen in last 6 months? No  LIVING ENVIRONMENT: Lives with: lives alone Lives in: House/apartment Stairs: Yes: External: 5 steps; on right going up, on left going up, and can reach both Has following equipment at home: Single point cane and wears life alert monitor    OCCUPATION: Retired, likes to Agricultural consultant at [employer]    PLOF: Independent  PATIENT GOALS: Control pain so that he can return to walking longer distances and standing for longer periods of time to cook again.   NEXT MD VISIT: 9 months; March 2026    OBJECTIVE:  Note: Objective measures were completed at Evaluation unless otherwise noted.  VITALS: BP 116/74 HR 66 SpO2 100%  DIAGNOSTIC FINDINGS:  CLINICAL DATA:  Low back pain, worse with standing. Symptoms over the last year.   EXAM: MRI LUMBAR SPINE WITHOUT CONTRAST   TECHNIQUE: Multiplanar, multisequence MR imaging of the lumbar spine was performed. No intravenous contrast was administered.   COMPARISON:  Radiography 11/30/2020   FINDINGS: Segmentation:  5 lumbar type vertebral bodies.   Alignment:  No malalignment.   Vertebrae: Benign appearing hemangioma within the T12 vertebral body. Discogenic endplate marrow changes throughout the lumbar region, with some active edema at L2-3, L3-4 and L4-5, which could correlate with localized back pain.   Conus medullaris and cauda equina: Conus extends to the L1-2 level. Conus and cauda equina appear normal.   Paraspinal and other soft tissues: Negative   Disc levels:   T11-12: Mild bulging of the disc.  No compressive stenosis.   T12-L1: Unremarkable interspace.   L1-2: Shallow protrusion of the disc with a  tiny amount caudally migrated disc material to the left of midline. Minimal indentation of the thecal sac. No compressive stenosis.   L2-3: Loss of disc height. Endplate osteophytes and bulging of the disc more prominent towards the left. Mild stenosis of the left lateral recess that could possibly cause left-sided neural compression. Discogenic endplate edema which could relate to back pain.  L3-4: Loss of disc height. Endplate osteophytes and minimal bulging of the disc. Mild facet hypertrophy on the left. Mild left foraminal stenosis.   L4-5: Loss of disc height. Endplate osteophytes and bulging of the disc. Facet and ligamentous hypertrophy. Mild stenosis of the lateral recesses and foramina but without visible neural compression. Discogenic endplate edema which could relate to back pain   L5-S1: Disc desiccation with mild loss of height. Endplate osteophytes and mild bulging of the disc. No compressive narrowing of the canal or foramina.   IMPRESSION: Degenerative disc disease throughout the lumbar region with loss of disc height. No apparent compressive stenosis of the canal. Discogenic endplate marrow changes without active edema at L2-3, L3-4 and L4-5, which could correlate with regional axial back pain.   Left lateral recess and foraminal narrowing at L2-3, left foraminal narrowing at L3-4, and bilateral lateral recess and foraminal narrowing at L4-5. None of the stenoses are severe or are seen to result in definite neural compression.   Shallow left posterolateral disc herniation at L1-2 with caudal migration of a tiny amount of disc material to the left of midline. This does not appear to cause any significant compressive effect upon the neural structures.     Electronically Signed   By: Oneil Officer M.D.   On: 07/14/2021 23:01  PATIENT SURVEYS:  Modified Oswestry:  MODIFIED OSWESTRY DISABILITY SCALE  Date: 11/27/23 Score  Pain intensity 3 =  Pain medication  provides me with moderate relief from pain.  2. Personal care (washing, dressing, etc.) 1 =  I can take care of myself normally, but it increases my pain.  3. Lifting 3 = Pain prevents me from lifting heavy weights, but I can manage (5) I have hardly any social life because of my pain. light to medium weights if they are conveniently positioned  4. Walking 1 = Pain prevents me from walking more than 1 mile.  5. Sitting 2 =  Pain prevents me from sitting more than 1 hour.  6. Standing 2 =  Pain prevents me from standing more than 1 hour  7. Sleeping 0 = Pain does not prevent me from sleeping well.  8. Social Life 2 = Pain prevents me from participating in more energetic activities (eg. sports, dancing).  9. Traveling 1 =  I can travel anywhere, but it increases my pain.  10. Employment/ Homemaking 1 = My normal homemaking/job activities increase my pain, but I can still perform all that is required of me  Total 16/50= 32%   Interpretation of scores: Score Category Description  0-20% Minimal Disability The patient can cope with most living activities. Usually no treatment is indicated apart from advice on lifting, sitting and exercise  21-40% Moderate Disability The patient experiences more pain and difficulty with sitting, lifting and standing. Travel and social life are more difficult and they may be disabled from work. Personal care, sexual activity and sleeping are not grossly affected, and the patient can usually be managed by conservative means  41-60% Severe Disability Pain remains the main problem in this group, but activities of daily living are affected. These patients require a detailed investigation  61-80% Crippled Back pain impinges on all aspects of the patient's life. Positive intervention is required  81-100% Bed-bound  These patients are either bed-bound or exaggerating their symptoms  Bluford FORBES Zoe DELENA Karon DELENA, et al. Surgery versus conservative management of stable  thoracolumbar fracture: the PRESTO feasibility RCT. [place] ([place]): [employer]; 2021 Nov. (  Health Technology Assessment, No. 25.62.) Appendix 3, Oswestry Disability Index category descriptors. Available from: FindJewelers.cz  Minimally Clinically Important Difference (MCID) = 12.8%  COGNITION: Overall cognitive status: Within functional limits for tasks assessed     SENSATION: WFL  MUSCLE LENGTH: Hamstrings: Right 80 deg; Left 80 deg Ely's: Positive Bilateral   POSTURE: rounded shoulders and forward head  PALPATION: Right glute med    LUMBAR ROM:   AROM eval  Flexion 100%  Extension 75%*  Right lateral flexion 100%*  Left lateral flexion 100%  Right rotation 100%  Left rotation 100%   (Blank rows = not tested)  LOWER EXTREMITY ROM:     Active  Right eval Left eval  Hip flexion    Hip extension    Hip abduction    Hip adduction    Hip internal rotation    Hip external rotation    Knee flexion    Knee extension    Ankle dorsiflexion    Ankle plantarflexion    Ankle inversion    Ankle eversion     (Blank rows = not tested)  LOWER EXTREMITY MMT:    MMT Right eval Left eval  Hip flexion 4 4  Hip extension 3 3  Hip abduction 4 4  Hip adduction 4 4  Hip internal rotation 4 4  Hip external rotation 4 4  Knee flexion 4 4  Knee extension 4 4  Ankle dorsiflexion 4+ 4+  Ankle plantarflexion    Ankle inversion    Ankle eversion     (Blank rows = not tested)  LUMBAR SPECIAL TESTS:  Straight leg raise test: Negative, SI Compression/distraction test: Negative, FABER test: Positive Bilateral , and FADIR + RLE   FUNCTIONAL TESTS:  6 minute walk test: NT  10 meter walk test: NT   GAIT:  Not assessed    TREATMENT DATE:   12/13/23: THEREX Nu-Step seat and arms at 11 for 5 min and resistance at 3  Prone Press Up with 5 sec hold 2 x 10   -min VC to look up for increased thoracic extension Prone Quad Stretch 3 x 60  sec   Standing Rows with Grey Band 3 x 10   Bird Dog 2 x 10   -Pt struggles to coordinate exercise and lack hip extension strength to perform against gravity.  Modified Bird Dog 2 x 10   -mod VC to maintain knee extension  Gait Training   Use two single point canes or PVC poles 10 M  X 10  -Pt does not report feeling relief in his low back .    PATIENT EDUCATION:  Education details: Form and technique for correct performance of exercise and explanation about directional pattern for movement Person educated: Patient Education method: Explanation, Demonstration, Verbal cues, and Handouts Education comprehension: verbalized understanding, returned demonstration, and verbal cues required  HOME EXERCISE PROGRAM: Access Code: [email or web address] URL: [email or web address] Date: 12/13/2023 Prepared by: Toribio Servant  Exercises - Supine Lower Trunk Rotation  - 1 x daily - 7 x weekly - 2 sets - 10 reps - 3 sec hold - Supine Shoulder External and Internal Rotation in Abduction with Dumbbell  - 1 x daily - 7 x weekly - 2 sets - 10 reps - 3-5 sec  hold - Cat Cow  - 1 x daily - 7 x weekly - 2 sets - 10 reps - 3 sec  hold - Prone Press Up On Elbows  - 1 x daily - 7 x weekly -  2 sets - 10 reps - 5 sec   hold - Curl Up with Reach  - 3-4 x weekly - 3 sets - 8-10 reps - Plank with Hip Extension on Counter  - 3-4 x weekly - 3 sets - 10 reps - Shoulder PNF D2 Flexion  - 3-4 x weekly - 3 sets - 10 reps  ASSESSMENT:  CLINICAL IMPRESSION: Pt shows signs and symptoms of disc herniation with lumbar flexion worsening his low back pain. Pt did not experience an improvement in low back pain with use of additional cane for additional UE support while walking, but these were canes and PT will re-attempt when pt brings in walking sticks. He continues to be highly motivated and he was able to complete all exercises without limitation.  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    OBJECTIVE IMPAIRMENTS: decreased knowledge of condition, difficulty walking, decreased ROM, decreased strength, hypomobility, impaired flexibility, postural dysfunction, and pain.   ACTIVITY LIMITATIONS: carrying, lifting, bending, sitting, standing, squatting, stairs, bathing, hygiene/grooming, and locomotion level  PARTICIPATION LIMITATIONS: cleaning, laundry, shopping, community activity, and yard work  PERSONAL FACTORS: Age, Time since onset of injury/illness/exacerbation, and 3+ comorbidities: Parkinson's disease, Hypothyroidism, h/o lumbar spondylosis   are also affecting patient's functional outcome.   REHAB POTENTIAL: Fair chronicity of condition and repeated past bouts of physical therapy    CLINICAL DECISION MAKING: Stable/uncomplicated  EVALUATION COMPLEXITY: Low   GOALS: Goals reviewed with patient? No  SHORT TERM GOALS: Target date: 12/11/2023  Patient will demonstrate undestanding of home exercise plan by performing exercises correctly with evidence of good carry over with min to no verbal or tactile cues .   Baseline: NT  12/10/23: Performing independently  Goal status: ACHIEVED        LONG TERM GOALS: Target date: 02/19/2024  Patient will improve modified Oswestry Disability Index (MODI) score by >=13% as evidence of the minimal statistically significant change for improvement with low back pain disability and improvement in low back function (Copay et al, 2008) Baseline: 16/50 (32%) Goal status: ONGOING   2.  Patient will improve hip strength by 1/3 grade MMT (I.E 4- to 4+) and abdominal strength by >= 1 grade for improved lumbar stability and to assist low back in absorbing mechanical forces in weight bearing positions for improved symptom  response. Baseline: Hip Flex  R/L 4/4 , Hip Ext R/L 3/3, Hip Abd  R/L 4/4, [place] Level 3     Goal status: ONGOING   3. Patient will be able to ambulate for 6 minutes without needing a standing or seated rest break due to pain and he will improve his distance by >=82 meters (269 ft) as evidence of an improved minimal detectable change or progress in aerobic endurance and lumbar function (Steffy and Seney 2008).  Baseline: 1,400 ft  Goal status: ONGOING     PLAN:  PT FREQUENCY: 1-2x/week  PT DURATION: 12 weeks  PLANNED INTERVENTIONS: 97164- PT Re-evaluation, 97750- Physical Performance Testing, 97110-Therapeutic exercises, 97530- Therapeutic activity, [id number]- Neuromuscular re-education, 97535- Self Care, 02859- Manual therapy, [phone]- Gait training, [phone]- Aquatic Therapy, [phone]- Electrical stimulation (unattended), [phone]- Electrical stimulation (manual), [id number]- Traction (mechanical), 20560 (1-2 muscles), 20561 (3+ muscles)- Dry Needling, Patient/Family education, Balance training, Stair training, Taping, Joint mobilization, Joint manipulation,  Spinal manipulation, Spinal mobilization, Vestibular training, DME instructions, Cryotherapy, and Moist heat.  PLAN FOR NEXT SESSION:  Gait training with poles to improve upright posture.  Hip Ext MMT in supine to determine whether pain. MATRIX Hip Strengthening Machine. Good Morning. Standing Hip Abduction   Toribio Servant PT, DPT  [facility] [address] [place], [address] Phone: [phone]   Fax:  [phone]

## 2023-12-14 DIAGNOSIS — E538 Deficiency of other specified B group vitamins: Secondary | ICD-10-CM | POA: Diagnosis not present

## 2023-12-19 ENCOUNTER — Ambulatory Visit: Admitting: Physical Therapy

## 2023-12-19 DIAGNOSIS — M5459 Other low back pain: Secondary | ICD-10-CM | POA: Diagnosis not present

## 2023-12-19 DIAGNOSIS — R262 Difficulty in walking, not elsewhere classified: Secondary | ICD-10-CM | POA: Diagnosis not present

## 2023-12-19 NOTE — Therapy (Signed)
 OUTPATIENT PHYSICAL THERAPY THORACOLUMBAR TREATMENT   Patient Name: Daniel [name] MRN: [id number] DOB:May 03, 1951, 73 y.o., male Today's Date: 12/19/2023  END OF SESSION:  PT End of Session - 12/19/23 0910     Visit Number 5    Number of Visits 24    Date for PT Re-Evaluation 02/19/24    Authorization Type [id number]    Authorization - Visit Number 5    Authorization - Number of Visits 24    Progress Note Due on Visit 10    PT Start Time 0905    PT Stop Time 0945    PT Time Calculation (min) 40 min    Activity Tolerance Patient tolerated treatment well    Behavior During Therapy [facility] for tasks assessed/performed           Past Medical History:  Diagnosis Date   Actinic keratosis    Allergy FROM CHILDHOOD   Arthritis    Asthma    COVID-19 01/03/2023   Eczema    GERD (gastroesophageal reflux disease)    Hx of dysplastic nevus 06/06/2006   Mid lower back, slight atypia   Hypothyroidism    Seasonal allergies    Thyroid  disease    Past Surgical History:  Procedure Laterality Date   CATARACT EXTRACTION     COLON SURGERY     COLONOSCOPY WITH PROPOFOL  N/A 11/18/2014   Procedure: COLONOSCOPY WITH PROPOFOL ;  Surgeon: Lamar ONEIDA Holmes, MD;  Location: [facility] ENDOSCOPY;  Service: Endoscopy;  Laterality: N/A;   COLONOSCOPY WITH PROPOFOL  N/A 12/13/2021   Procedure: COLONOSCOPY WITH PROPOFOL ;  Surgeon: Maryruth Ole ONEIDA, MD;  Location: [facility] ENDOSCOPY;  Service: Endoscopy;  Laterality: N/A;   EYE SURGERY     KNEE SURGERY     NASAL SEPTUM SURGERY     Patient Active Problem List   Diagnosis Date Noted   Parkinson's disease (HCC) 09/26/2022   Chronic idiopathic constipation 06/05/2022   Paroxysmal atrial fibrillation (HCC) 03/28/2022   Spondylolysis, lumbar region 03/28/2022   Avitaminosis D 03/28/2022   Myalgia due to statin 03/28/2022   Acquired thrombophilia (HCC) 03/28/2022   Hyperlipidemia 03/28/2022   Abnormal gait 03/28/2022   Mild intermittent asthma without  complication 03/28/2022   Zenker's (hypopharyngeal) diverticulum 03/22/2018   Gastroesophageal reflux disease 03/22/2018   Fatty liver 10/09/2017   BPH (benign prostatic hyperplasia) 11/21/2014   Hypothyroidism 11/21/2014   Inguinal hernia 11/21/2014   OA (osteoarthritis) 11/21/2014   H/O adenomatous polyp of colon 10/14/2014    PCP: Dr. Jon Eva    REFERRING PROVIDER: Dr. Jannett Fairly   REFERRING DIAG: [phone] (ICD-10-CM) - DDD (degenerative disc disease), lumbar  Rationale for Evaluation and Treatment: Rehabilitation  THERAPY DIAG:  Other low back pain  Difficulty in walking, not elsewhere classified  ONSET DATE: 3+ years ago (around time of COVID)    SUBJECTIVE:  SUBJECTIVE STATEMENT: Pt reports experiencing worsening right sided hip pain since last visit. He describes this happens from time to time and that it usually resolves with using back brace for a couple of weeks.    PERTINENT HISTORY:  Pt reports that he started to experience low back pain a couple of years ago when he was walking during the time of COVID. Pain starts around the lower right hip and then spreads to his shoulder blades as the day goes on or the longer he is on his feet. He usually wears a back brace, which helps some. He is walking two blocks every day to build back to walking two miles per day. Patient was walking two miles a day for 5 out of 7 days per week and he was doing this up until this time of COVID. He feels that his balance is ok and that he has not had any falls in the past 6 months and he carries walking stick with him. He also has increased right knee pain that is due to OA and that he has received steroid joint injections which have helped in the past.   PAIN:  Are you having pain? Yes: NPRS scale:  5-6/10 (currently when sitting), 10/10 with standing or walking long periods of time   Pain location: right greater trochanter  Pain description: Achy  Aggravating factors: Standing or walking for long periods of time   Relieving factors: Prednisone  for   PRECAUTIONS: None  RED FLAGS: None   WEIGHT BEARING RESTRICTIONS: No  FALLS:  Has patient fallen in last 6 months? No  LIVING ENVIRONMENT: Lives with: lives alone Lives in: House/apartment Stairs: Yes: External: 5 steps; on right going up, on left going up, and can reach both Has following equipment at home: Single point cane and wears life alert monitor    OCCUPATION: Retired, likes to Agricultural consultant at [employer]    PLOF: Independent  PATIENT GOALS: Control pain so that he can return to walking longer distances and standing for longer periods of time to cook again.   NEXT MD VISIT: 9 months; March 2026    OBJECTIVE:  Note: Objective measures were completed at Evaluation unless otherwise noted.  VITALS: BP 116/74 HR 66 SpO2 100%  DIAGNOSTIC FINDINGS:  CLINICAL DATA:  Low back pain, worse with standing. Symptoms over the last year.   EXAM: MRI LUMBAR SPINE WITHOUT CONTRAST   TECHNIQUE: Multiplanar, multisequence MR imaging of the lumbar spine was performed. No intravenous contrast was administered.   COMPARISON:  Radiography 11/30/2020   FINDINGS: Segmentation:  5 lumbar type vertebral bodies.   Alignment:  No malalignment.   Vertebrae: Benign appearing hemangioma within the T12 vertebral body. Discogenic endplate marrow changes throughout the lumbar region, with some active edema at L2-3, L3-4 and L4-5, which could correlate with localized back pain.   Conus medullaris and cauda equina: Conus extends to the L1-2 level. Conus and cauda equina appear normal.   Paraspinal and other soft tissues: Negative   Disc levels:   T11-12: Mild bulging of the disc.  No compressive stenosis.   T12-L1:  Unremarkable interspace.   L1-2: Shallow protrusion of the disc with a tiny amount caudally migrated disc material to the left of midline. Minimal indentation of the thecal sac. No compressive stenosis.   L2-3: Loss of disc height. Endplate osteophytes and bulging of the disc more prominent towards the left. Mild stenosis of the left lateral recess that could possibly cause left-sided neural compression.  Discogenic endplate edema which could relate to back pain.   L3-4: Loss of disc height. Endplate osteophytes and minimal bulging of the disc. Mild facet hypertrophy on the left. Mild left foraminal stenosis.   L4-5: Loss of disc height. Endplate osteophytes and bulging of the disc. Facet and ligamentous hypertrophy. Mild stenosis of the lateral recesses and foramina but without visible neural compression. Discogenic endplate edema which could relate to back pain   L5-S1: Disc desiccation with mild loss of height. Endplate osteophytes and mild bulging of the disc. No compressive narrowing of the canal or foramina.   IMPRESSION: Degenerative disc disease throughout the lumbar region with loss of disc height. No apparent compressive stenosis of the canal. Discogenic endplate marrow changes without active edema at L2-3, L3-4 and L4-5, which could correlate with regional axial back pain.   Left lateral recess and foraminal narrowing at L2-3, left foraminal narrowing at L3-4, and bilateral lateral recess and foraminal narrowing at L4-5. None of the stenoses are severe or are seen to result in definite neural compression.   Shallow left posterolateral disc herniation at L1-2 with caudal migration of a tiny amount of disc material to the left of midline. This does not appear to cause any significant compressive effect upon the neural structures.     Electronically Signed   By: Oneil Officer M.D.   On: 07/14/2021 23:01  PATIENT SURVEYS:  Modified Oswestry:  MODIFIED OSWESTRY  DISABILITY SCALE  Date: 11/27/23 Score  Pain intensity 3 =  Pain medication provides me with moderate relief from pain.  2. Personal care (washing, dressing, etc.) 1 =  I can take care of myself normally, but it increases my pain.  3. Lifting 3 = Pain prevents me from lifting heavy weights, but I can manage (5) I have hardly any social life because of my pain. light to medium weights if they are conveniently positioned  4. Walking 1 = Pain prevents me from walking more than 1 mile.  5. Sitting 2 =  Pain prevents me from sitting more than 1 hour.  6. Standing 2 =  Pain prevents me from standing more than 1 hour  7. Sleeping 0 = Pain does not prevent me from sleeping well.  8. Social Life 2 = Pain prevents me from participating in more energetic activities (eg. sports, dancing).  9. Traveling 1 =  I can travel anywhere, but it increases my pain.  10. Employment/ Homemaking 1 = My normal homemaking/job activities increase my pain, but I can still perform all that is required of me  Total 16/50= 32%   Interpretation of scores: Score Category Description  0-20% Minimal Disability The patient can cope with most living activities. Usually no treatment is indicated apart from advice on lifting, sitting and exercise  21-40% Moderate Disability The patient experiences more pain and difficulty with sitting, lifting and standing. Travel and social life are more difficult and they may be disabled from work. Personal care, sexual activity and sleeping are not grossly affected, and the patient can usually be managed by conservative means  41-60% Severe Disability Pain remains the main problem in this group, but activities of daily living are affected. These patients require a detailed investigation  61-80% Crippled Back pain impinges on all aspects of the patient's life. Positive intervention is required  81-100% Bed-bound  These patients are either bed-bound or exaggerating their symptoms  Bluford BRAVO, [place]  DELENA Karon DELENA, et al. Surgery versus conservative management of stable thoracolumbar fracture:  the PRESTO feasibility RCT. [place] ([place]): [employer]; 2021 Nov. [facility] Technology Assessment, No. 25.62.) Appendix 3, Oswestry Disability Index category descriptors. Available from: FindJewel[employer]cz  Minimally Clinically Important Difference (MCID) = 12.8%  COGNITION: Overall cognitive status: Within functional limits for tasks assessed     SENSATION: WFL  MUSCLE LENGTH: Hamstrings: Right 80 deg; Left 80 deg Ely's: Positive Bilateral   POSTURE: rounded shoulders and forward head  PALPATION: Right glute med    LUMBAR ROM:   AROM eval  Flexion 100%  Extension 75%*  Right lateral flexion 100%*  Left lateral flexion 100%  Right rotation 100%  Left rotation 100%   (Blank rows = not tested)  LOWER EXTREMITY ROM:     Active  Right eval Left eval  Hip flexion    Hip extension    Hip abduction    Hip adduction    Hip internal rotation    Hip external rotation    Knee flexion    Knee extension    Ankle dorsiflexion    Ankle plantarflexion    Ankle inversion    Ankle eversion     (Blank rows = not tested)  LOWER EXTREMITY MMT:    MMT Right eval Left eval  Hip flexion 4 4  Hip extension 3 3  Hip abduction 4 4  Hip adduction 4 4  Hip internal rotation 4 4  Hip external rotation 4 4  Knee flexion 4 4  Knee extension 4 4  Ankle dorsiflexion 4+ 4+  Ankle plantarflexion    Ankle inversion    Ankle eversion     (Blank rows = not tested)  LUMBAR SPECIAL TESTS:  Straight leg raise test: Negative, SI Compression/distraction test: Negative, FABER test: Positive Bilateral , and FADIR + RLE   FUNCTIONAL TESTS:  6 minute walk test: NT  10 meter walk test: NT   GAIT:  Not assessed    TREATMENT DATE:   12/19/23: THEREX Nu-Step seat and arms at 11 for 5 min and resistance at 3  Matrix Hip Flexion #40 3 x 10  -pt reports  increased pain in right hip when performing left hip flexion  Matrix Hip Abduction #40 3 x 10  OMEGA Leg Press #65 1 x 10  -Pt reports lateral joint line pain in bilateral  OMEGA Leg Press #65 2 x 10 with red band around knees for ER activation  -Pt reports lateral joint line pain in bilateral  Long Sitting HS Stretch on table with strap 2 x 30 sec  -Pt unable to maintain an extended knee    Seated HS Stretch on with strap and heel propped on 6 inch  6 x 60 sec       PATIENT EDUCATION:  Education details: Form and technique for correct performance of exercise and explanation about directional pattern for movement Person educated: Patient Education method: Programmer, multimedia, Demonstration, Verbal cues, and Handouts Education comprehension: verbalized understanding, returned demonstration, and verbal cues required  HOME EXERCISE PROGRAM:   ASSESSMENT:  CLINICAL IMPRESSION: Despite high initial right sided hip pain, pt was not limited by pain during session and he did not have a provocation of back pain as well.  His right knee pain was somewhat limiting with leg press due increased knee flexion causing lateral joint line pain.  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   OBJECTIVE IMPAIRMENTS: decreased knowledge of condition, difficulty walking, decreased ROM, decreased strength, hypomobility, impaired flexibility, postural dysfunction, and pain.   ACTIVITY LIMITATIONS: carrying, lifting, bending, sitting, standing, squatting, stairs, bathing, hygiene/grooming, and locomotion level  PARTICIPATION LIMITATIONS: cleaning, laundry, shopping, community activity, and yard work  PERSONAL FACTORS: Age, Time since onset of injury/illness/exacerbation, and 3+  comorbidities: Parkinson's disease, Hypothyroidism, h/o lumbar spondylosis   are also affecting patient's functional outcome.   REHAB POTENTIAL: Fair chronicity of condition and repeated past bouts of physical therapy    CLINICAL DECISION MAKING: Stable/uncomplicated  EVALUATION COMPLEXITY: Low   GOALS: Goals reviewed with patient? No  SHORT TERM GOALS: Target date: 12/11/2023  Patient will demonstrate undestanding of home exercise plan by performing exercises correctly with evidence of good carry over with min to no verbal or tactile cues .   Baseline: NT  12/10/23: Performing independently  Goal status: ACHIEVED        LONG TERM GOALS: Target date: 02/19/2024  Patient will improve modified Oswestry Disability Index (MODI) score by >=13% as evidence of the minimal statistically significant change for improvement with low back pain disability and improvement in low back function (Copay et al, 2008) Baseline: 16/50 (32%) Goal status: ONGOING   2.  Patient will improve hip strength by 1/3 grade MMT (I.E 4- to 4+) and abdominal strength by >= 1 grade for improved lumbar stability and to assist low back in absorbing mechanical forces in weight bearing positions for improved symptom response. Baseline: Hip Flex  R/L 4/4 , Hip Ext R/L 3/3, Hip Abd  R/L 4/4, [place] Level 3     Goal status: ONGOING   3. Patient will be able to ambulate for 6 minutes without needing a standing or seated rest break due to pain and he will improve his distance by >=82 meters (269 ft) as evidence of an improved minimal detectable change or progress in aerobic endurance and lumbar function (Steffy and Seney 2008).  Baseline: 1,400 ft  Goal status: ONGOING     PLAN:  PT FREQUENCY: 1-2x/week  PT DURATION: 12 weeks  PLANNED INTERVENTIONS: 97164- PT Re-evaluation, 97750- Physical Performance Testing, 97110-Therapeutic exercises, 97530- Therapeutic activity, 97112- Neuromuscular re-education, 97535- Self  Care, 02859- Manual therapy, [phone]- Gait training, [phone]- Aquatic Therapy, [phone]- Electrical stimulation (unattended), [phone]- Electrical stimulation (manual), [id number]- Traction (mechanical), 20560 (1-2 muscles), 20561 (3+ muscles)- Dry Needling, Patient/Family education, Balance training, Stair training, Taping, Joint mobilization, Joint manipulation, Spinal manipulation, Spinal mobilization, Vestibular training, DME instructions, Cryotherapy, and Moist heat.  PLAN FOR NEXT SESSION:   Standing postural exercises.  Hip strengthening: standing hip abduction and good morning. Hip Ext MMT in supine to determine whether pain.   Toribio Servant PT, DPT  [facility] [address] [place], [address] Phone: [phone]   Fax:  [phone]

## 2023-12-24 ENCOUNTER — Ambulatory Visit: Admitting: Physical Therapy

## 2023-12-24 DIAGNOSIS — M5459 Other low back pain: Secondary | ICD-10-CM

## 2023-12-24 DIAGNOSIS — R262 Difficulty in walking, not elsewhere classified: Secondary | ICD-10-CM

## 2023-12-24 NOTE — Therapy (Signed)
 OUTPATIENT PHYSICAL THERAPY THORACOLUMBAR TREATMENT   Patient Name: Daniel [name] MRN: [id number] DOB:[birth year], 73 y.o., male Today's Date: 12/24/2023  END OF SESSION:  PT End of Session - 12/24/23 0826     Visit Number 6    Number of Visits 24    Date for PT Re-Evaluation 02/19/24    Authorization Type [id number]    Authorization - Visit Number 6    Authorization - Number of Visits 24    Progress Note Due on Visit 10    PT Start Time 0820    PT Stop Time 0900    PT Time Calculation (min) 40 min    Activity Tolerance Patient tolerated treatment well    Behavior During Therapy [facility] for tasks assessed/performed           Past Medical History:  Diagnosis Date   Actinic keratosis    Allergy FROM CHILDHOOD   Arthritis    Asthma    COVID-19 01/03/2023   Eczema    GERD (gastroesophageal reflux disease)    Hx of dysplastic nevus 06/06/2006   Mid lower back, slight atypia   Hypothyroidism    Seasonal allergies    Thyroid  disease    Past Surgical History:  Procedure Laterality Date   CATARACT EXTRACTION     COLON SURGERY     COLONOSCOPY WITH PROPOFOL  N/A 11/18/2014   Procedure: COLONOSCOPY WITH PROPOFOL ;  Surgeon: Lamar ONEIDA Holmes, MD;  Location: [facility] ENDOSCOPY;  Service: Endoscopy;  Laterality: N/A;   COLONOSCOPY WITH PROPOFOL  N/A 12/13/2021   Procedure: COLONOSCOPY WITH PROPOFOL ;  Surgeon: Maryruth Ole ONEIDA, MD;  Location: [facility] ENDOSCOPY;  Service: Endoscopy;  Laterality: N/A;   EYE SURGERY     KNEE SURGERY     NASAL SEPTUM SURGERY     Patient Active Problem List   Diagnosis Date Noted   Parkinson's disease (HCC) 09/26/2022   Chronic idiopathic constipation 06/05/2022   Paroxysmal atrial fibrillation (HCC) 03/28/2022   Spondylolysis, lumbar region 03/28/2022   Avitaminosis D 03/28/2022   Myalgia due to statin 03/28/2022   Acquired thrombophilia (HCC) 03/28/2022   Hyperlipidemia 03/28/2022   Abnormal gait 03/28/2022   Mild intermittent asthma without  complication 03/28/2022   Zenker's (hypopharyngeal) diverticulum 03/22/2018   Gastroesophageal reflux disease 03/22/2018   Fatty liver 10/09/2017   BPH (benign prostatic hyperplasia) 11/21/2014   Hypothyroidism 11/21/2014   Inguinal hernia 11/21/2014   OA (osteoarthritis) 11/21/2014   H/O adenomatous polyp of colon 10/14/2014    PCP: Dr. Jon Eva    REFERRING PROVIDER: Dr. Jannett Fairly   REFERRING DIAG: [phone] (ICD-10-CM) - DDD (degenerative disc disease), lumbar  Rationale for Evaluation and Treatment: Rehabilitation  THERAPY DIAG:  Other low back pain  Difficulty in walking, not elsewhere classified  ONSET DATE: 3+ years ago (around time of COVID)    SUBJECTIVE:  SUBJECTIVE STATEMENT: Pt reports increased fatigue for the past couple of days. He is not sure what is causing it and he felt such fatigue that he had to leave church early. He feels better than yesterday or Saturday but his fatigue is still noticeable.   PERTINENT HISTORY:  Pt reports that he started to experience low back pain a couple of years ago when he was walking during the time of COVID. Pain starts around the lower right hip and then spreads to his shoulder blades as the day goes on or the longer he is on his feet. He usually wears a back brace, which helps some. He is walking two blocks every day to build back to walking two miles per day. Patient was walking two miles a day for 5 out of 7 days per week and he was doing this up until this time of COVID. He feels that his balance is ok and that he has not had any falls in the past 6 months and he carries walking stick with him. He also has increased right knee pain that is due to OA and that he has received steroid joint injections which have helped in the past.   PAIN:   Are you having pain? Yes: NPRS scale: 5-6/10 (currently when sitting), 10/10 with standing or walking long periods of time   Pain location: right greater trochanter  Pain description: Achy  Aggravating factors: Standing or walking for long periods of time   Relieving factors: Prednisone  for   PRECAUTIONS: None  RED FLAGS: None   WEIGHT BEARING RESTRICTIONS: No  FALLS:  Has patient fallen in last 6 months? No  LIVING ENVIRONMENT: Lives with: lives alone Lives in: House/apartment Stairs: Yes: External: 5 steps; on right going up, on left going up, and can reach both Has following equipment at home: Single point cane and wears life alert monitor    OCCUPATION: Retired, likes to Agricultural consultant at [employer]    PLOF: Independent  PATIENT GOALS: Control pain so that he can return to walking longer distances and standing for longer periods of time to cook again.   NEXT MD VISIT: 9 months; March 2026    OBJECTIVE:  Note: Objective measures were completed at Evaluation unless otherwise noted.  VITALS: BP 116/74 HR 66 SpO2 100%  DIAGNOSTIC FINDINGS:  CLINICAL DATA:  Low back pain, worse with standing. Symptoms over the last year.   EXAM: MRI LUMBAR SPINE WITHOUT CONTRAST   TECHNIQUE: Multiplanar, multisequence MR imaging of the lumbar spine was performed. No intravenous contrast was administered.   COMPARISON:  Radiography 11/30/2020   FINDINGS: Segmentation:  5 lumbar type vertebral bodies.   Alignment:  No malalignment.   Vertebrae: Benign appearing hemangioma within the T12 vertebral body. Discogenic endplate marrow changes throughout the lumbar region, with some active edema at L2-3, L3-4 and L4-5, which could correlate with localized back pain.   Conus medullaris and cauda equina: Conus extends to the L1-2 level. Conus and cauda equina appear normal.   Paraspinal and other soft tissues: Negative   Disc levels:   T11-12: Mild bulging of the disc.  No  compressive stenosis.   T12-L1: Unremarkable interspace.   L1-2: Shallow protrusion of the disc with a tiny amount caudally migrated disc material to the left of midline. Minimal indentation of the thecal sac. No compressive stenosis.   L2-3: Loss of disc height. Endplate osteophytes and bulging of the disc more prominent towards the left. Mild stenosis of the left  lateral recess that could possibly cause left-sided neural compression. Discogenic endplate edema which could relate to back pain.   L3-4: Loss of disc height. Endplate osteophytes and minimal bulging of the disc. Mild facet hypertrophy on the left. Mild left foraminal stenosis.   L4-5: Loss of disc height. Endplate osteophytes and bulging of the disc. Facet and ligamentous hypertrophy. Mild stenosis of the lateral recesses and foramina but without visible neural compression. Discogenic endplate edema which could relate to back pain   L5-S1: Disc desiccation with mild loss of height. Endplate osteophytes and mild bulging of the disc. No compressive narrowing of the canal or foramina.   IMPRESSION: Degenerative disc disease throughout the lumbar region with loss of disc height. No apparent compressive stenosis of the canal. Discogenic endplate marrow changes without active edema at L2-3, L3-4 and L4-5, which could correlate with regional axial back pain.   Left lateral recess and foraminal narrowing at L2-3, left foraminal narrowing at L3-4, and bilateral lateral recess and foraminal narrowing at L4-5. None of the stenoses are severe or are seen to result in definite neural compression.   Shallow left posterolateral disc herniation at L1-2 with caudal migration of a tiny amount of disc material to the left of midline. This does not appear to cause any significant compressive effect upon the neural structures.     Electronically Signed   By: Oneil Officer M.D.   On: 07/14/2021 23:01  PATIENT SURVEYS:   Modified Oswestry:  MODIFIED OSWESTRY DISABILITY SCALE  Date: 11/27/23 Score  Pain intensity 3 =  Pain medication provides me with moderate relief from pain.  2. Personal care (washing, dressing, etc.) 1 =  I can take care of myself normally, but it increases my pain.  3. Lifting 3 = Pain prevents me from lifting heavy weights, but I can manage (5) I have hardly any social life because of my pain. light to medium weights if they are conveniently positioned  4. Walking 1 = Pain prevents me from walking more than 1 mile.  5. Sitting 2 =  Pain prevents me from sitting more than 1 hour.  6. Standing 2 =  Pain prevents me from standing more than 1 hour  7. Sleeping 0 = Pain does not prevent me from sleeping well.  8. Social Life 2 = Pain prevents me from participating in more energetic activities (eg. sports, dancing).  9. Traveling 1 =  I can travel anywhere, but it increases my pain.  10. Employment/ Homemaking 1 = My normal homemaking/job activities increase my pain, but I can still perform all that is required of me  Total 16/50= 32%   Interpretation of scores: Score Category Description  0-20% Minimal Disability The patient can cope with most living activities. Usually no treatment is indicated apart from advice on lifting, sitting and exercise  21-40% Moderate Disability The patient experiences more pain and difficulty with sitting, lifting and standing. Travel and social life are more difficult and they may be disabled from work. Personal care, sexual activity and sleeping are not grossly affected, and the patient can usually be managed by conservative means  41-60% Severe Disability Pain remains the main problem in this group, but activities of daily living are affected. These patients require a detailed investigation  61-80% Crippled Back pain impinges on all aspects of the patient's life. Positive intervention is required  81-100% Bed-bound  These patients are either bed-bound or  exaggerating their symptoms  Bluford BRAVO, Zoe DELENA Karon DELENA, et  al. Surgery versus conservative management of stable thoracolumbar fracture: the PRESTO feasibility RCT. [place]): [employer]; 2021 Nov. [employer] Technology Assessment, No. 25.62.) Appendix 3, Oswestry Disability Index category descriptors. Available from: Find[employer]el[employer]cz  Minimally Clinically Important Difference (MCID) = 12.8%  COGNITION: Overall cognitive status: Within functional limits for tasks assessed     SENSATION: WFL  MUSCLE LENGTH: Hamstrings: Right 80 deg; Left 80 deg Ely's: Positive Bilateral   POSTURE: rounded shoulders and forward head  PALPATION: Right glute med    LUMBAR ROM:   AROM eval  Flexion 100%  Extension 75%*  Right lateral flexion 100%*  Left lateral flexion 100%  Right rotation 100%  Left rotation 100%   (Blank rows = not tested)  LOWER EXTREMITY ROM:     Active  Right eval Left eval  Hip flexion    Hip extension    Hip abduction    Hip adduction    Hip internal rotation    Hip external rotation    Knee flexion    Knee extension    Ankle dorsiflexion    Ankle plantarflexion    Ankle inversion    Ankle eversion     (Blank rows = not tested)  LOWER EXTREMITY MMT:    MMT Right eval Left eval  Hip flexion 4 4  Hip extension 3 3  Hip abduction 4 4  Hip adduction 4 4  Hip internal rotation 4 4  Hip external rotation 4 4  Knee flexion 4 4  Knee extension 4 4  Ankle dorsiflexion 4+ 4+  Ankle plantarflexion    Ankle inversion    Ankle eversion     (Blank rows = not tested)  LUMBAR SPECIAL TESTS:  Straight leg raise test: Negative, SI Compression/distraction test: Negative, FABER test: Positive Bilateral , and FADIR + RLE   FUNCTIONAL TESTS:  6 minute walk test: NT  10 meter walk test: NT   GAIT:  Not assessed    TREATMENT DATE:   12/24/23: THEREX Nu-Step seat and arms at 11 for 5 min and resistance at 3   Lower Trunk Rotation with 3 sec hold 2 x 10   Single Knee to Chest 3 sec hold 2 x 10   Dead Bug 2 x 10   -Pt reports increased low back pain  Curl Ups with Reach 1 x 10  Curl Ups with Reach to opposite knee 2 x 10  Supine Pec Stretch with towels rolled underneath patient 2 x 60 sec    -#2 DB placed in hands for increased pronation for a stretch   Standing Hip Abduction with BUE support 2 x 10   -min VC to keep toe pointed forward   Standing Hip Abduction with BUE support with yellow band 2 x 10  Standing Hip Abduction with BUE support with #3 AW 2 x 10     PATIENT EDUCATION:  Education details: Form and technique for correct performance of exercise and explanation about directional pattern for movement Person educated: Patient Education method: Explanation, Demonstration, Verbal cues, and Handouts Education comprehension: verbalized understanding, returned demonstration, and verbal cues required  HOME EXERCISE PROGRAM:  Access Code: [email or web address] URL: [email or web address] Date: 12/24/2023 Prepared by: Toribio Servant  Exercises - Seated Hamstring Stretch with Strap  - 1 x daily - 7 x weekly - 3 reps - 60 sec  hold - Supine Lower Trunk Rotation  - 1 x daily - 7 x weekly - 2 sets - 10 reps - 3 sec hold -  Supine Shoulder External and Internal Rotation in Abduction with Dumbbell  - 1 x daily - 7 x weekly - 2 sets - 10 reps - 3-5 sec  hold - Cat Cow  - 1 x daily - 7 x weekly - 2 sets - 10 reps - 3 sec  hold - Prone Press Up On Elbows  - 1 x daily - 7 x weekly - 2 sets - 10 reps - 5 sec   hold - Diagonal Curl Up with Reach  - 3-4 x weekly - 3 sets - 10 reps - Plank with Hip Extension on Counter  - 3-4 x weekly - 3 sets - 10 reps - Shoulder PNF D2 Flexion  - 3-4 x weekly - 3 sets - 10 reps - Standing Hip Abduction with Counter Support  - 3-4 x weekly - 3 sets - 10 reps   ASSESSMENT:  CLINICAL IMPRESSION: Despite initial fatigue, he was able to complete all exercises  without being limited by decreased energy. He did show improved upright posture with use of trekking poles while walking. He did not have an increase in his low back pain with the exception of performing lower abdominal strengthening exercises which placed an increased load on his low back. 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   OBJECTIVE IMPAIRMENTS: decreased knowledge of condition, difficulty walking, decreased ROM, decreased strength, hypomobility, impaired flexibility, postural dysfunction, and pain.   ACTIVITY LIMITATIONS: carrying, lifting, bending, sitting, standing, squatting, stairs, bathing, hygiene/grooming, and locomotion level  PARTICIPATION LIMITATIONS: cleaning, laundry, shopping, community activity, and yard work  PERSONAL FACTORS: Age, Time since onset of injury/illness/exacerbation, and 3+ comorbidities: Parkinson's disease, Hypothyroidism, h/o lumbar spondylosis   are also affecting patient's functional outcome.   REHAB POTENTIAL: Fair chronicity of condition and repeated past bouts of physical therapy    CLINICAL DECISION MAKING: Stable/uncomplicated  EVALUATION COMPLEXITY: Low   GOALS: Goals reviewed with patient? No  SHORT TERM GOALS: Target date: 12/11/2023  Patient will demonstrate undestanding of home exercise plan by performing exercises correctly with evidence of good carry over with min to no verbal or tactile cues .   Baseline: NT  12/10/23: Performing independently  Goal status: ACHIEVED        LONG TERM GOALS: Target date: 02/19/2024  Patient will improve modified Oswestry Disability Index (MODI) score by >=13% as evidence of the minimal statistically significant change for improvement with low back pain disability and  improvement in low back function (Copay et al, 2008) Baseline: 16/50 (32%) Goal status: ONGOING   2.  Patient will improve hip strength by 1/3 grade MMT (I.E 4- to 4+) and abdominal strength by >= 1 grade for improved lumbar stability and to assist low back in absorbing mechanical forces in weight bearing positions for improved symptom response. Baseline: Hip Flex  R/L 4/4 , Hip Ext R/L 3/3, Hip Abd  R/L 4/4, [place]ann Level 3     Goal status: ONGOING   3. Patient will be able to ambulate for 6 minutes without needing a standing or seated rest break due to pain and he will improve his distance by >=82 meters (269 ft) as evidence of an improved minimal detectable change or progress in aerobic endurance and lumbar function (Steffy and Seney 2008).  Baseline: 1,400 ft  Goal status: ONGOING     PLAN:  PT FREQUENCY: 1-2x/week  PT DURATION: 12 weeks  PLANNED INTERVENTIONS: 97164- PT Re-evaluation, 97750- Physical Performance Testing, 97110-Therapeutic exercises, 97530- Therapeutic activity, 97112- Neuromuscular re-education, 97535- Self Care, 02859- Manual therapy, [phone]- Gait training, [phone]- Aquatic Therapy, [phone]- Electrical stimulation (unattended), [phone]- Electrical stimulation (manual), [id number]- Traction (mechanical), 20560 (1-2 muscles), 20561 (3+ muscles)- Dry Needling, Patient/Family education, Balance training, Stair training, Taping, Joint mobilization, Joint manipulation, Spinal manipulation, Spinal mobilization, Vestibular training, DME instructions, Cryotherapy, and Moist heat.  PLAN FOR NEXT SESSION:   Warm up using treadmill and review RPE. Standing postural exercises: standing marches and hamstring curl. OMEGA machines leg press or Matrix hip machines.   Toribio Servant PT, DPT  [facility] [address], [place], [address] Phone: [phone]   Fax:  [phone]

## 2023-12-26 ENCOUNTER — Ambulatory Visit: Admitting: Physical Therapy

## 2023-12-26 DIAGNOSIS — R262 Difficulty in walking, not elsewhere classified: Secondary | ICD-10-CM | POA: Diagnosis not present

## 2023-12-26 DIAGNOSIS — M5459 Other low back pain: Secondary | ICD-10-CM

## 2023-12-26 NOTE — Therapy (Signed)
 OUTPATIENT PHYSICAL THERAPY THORACOLUMBAR TREATMENT   Patient Name: Daniel [name] MRN: [id number] DOB:[birth year], 73 y.o., male Today's Date: 12/26/2023  END OF SESSION:  PT End of Session - 12/26/23 0908     Visit Number 7    Number of Visits 24    Date for PT Re-Evaluation 02/19/24    Authorization Type [employer] [id number]    Authorization - Visit Number 7    Authorization - Number of Visits 24    Progress Note Due on Visit 10    PT Start Time 0903    PT Stop Time 0945    PT Time Calculation (min) 42 min    Activity Tolerance Patient tolerated treatment well    Behavior During Therapy [facility] for tasks assessed/performed           Past Medical History:  Diagnosis Date   Actinic keratosis    Allergy FROM CHILDHOOD   Arthritis    Asthma    COVID-19 01/03/2023   Eczema    GERD (gastroesophageal reflux disease)    Hx of dysplastic nevus 06/06/2006   Mid lower back, slight atypia   Hypothyroidism    Seasonal allergies    Thyroid  disease    Past Surgical History:  Procedure Laterality Date   CATARACT EXTRACTION     COLON SURGERY     COLONOSCOPY WITH PROPOFOL  N/A 11/18/2014   Procedure: COLONOSCOPY WITH PROPOFOL ;  Surgeon: Lamar ONEIDA Holmes, MD;  Location: [facility] ENDOSCOPY;  Service: Endoscopy;  Laterality: N/A;   COLONOSCOPY WITH PROPOFOL  N/A 12/13/2021   Procedure: COLONOSCOPY WITH PROPOFOL ;  Surgeon: Maryruth Ole ONEIDA, MD;  Location: [facility] ENDOSCOPY;  Service: Endoscopy;  Laterality: N/A;   EYE SURGERY     KNEE SURGERY     NASAL SEPTUM SURGERY     Patient Active Problem List   Diagnosis Date Noted   Parkinson's disease (HCC) 09/26/2022   Chronic idiopathic constipation 06/05/2022   Paroxysmal atrial fibrillation (HCC) 03/28/2022   Spondylolysis, lumbar region 03/28/2022   Avitaminosis D 03/28/2022   Myalgia due to statin 03/28/2022   Acquired thrombophilia (HCC) 03/28/2022   Hyperlipidemia 03/28/2022   Abnormal gait 03/28/2022   Mild intermittent asthma without  complication 03/28/2022   Zenker's (hypopharyngeal) diverticulum 03/22/2018   Gastroesophageal reflux disease 03/22/2018   Fatty liver 10/09/2017   BPH (benign prostatic hyperplasia) 11/21/2014   Hypothyroidism 11/21/2014   Inguinal hernia 11/21/2014   OA (osteoarthritis) 11/21/2014   H/O adenomatous polyp of colon 10/14/2014    PCP: Dr. Jon Eva    REFERRING PROVIDER: Dr. Jannett Fairly   REFERRING DIAG: [phone] (ICD-10-CM) - DDD (degenerative disc disease), lumbar  Rationale for Evaluation and Treatment: Rehabilitation  THERAPY DIAG:  Other low back pain  Difficulty in walking, not elsewhere classified  ONSET DATE: 3+ years ago (around time of COVID)    SUBJECTIVE:  SUBJECTIVE STATEMENT: Pt states that he has continued to be able to engage with walking for exercise without much difficulty. He continues to experience pain in right hip but this has not limited him. He has broken blood vessel in left eye today, but this is has happened before and patient is not experiencing any other symptoms.   PERTINENT HISTORY:  Pt reports that he started to experience low back pain a couple of years ago when he was walking during the time of COVID. Pain starts around the lower right hip and then spreads to his shoulder blades as the day goes on or the longer he is on his feet. He usually wears a back brace, which helps some. He is walking two blocks every day to build back to walking two miles per day. Patient was walking two miles a day for 5 out of 7 days per week and he was doing this up until this time of COVID. He feels that his balance is ok and that he has not had any falls in the past 6 months and he carries walking stick with him. He also has increased right knee pain that is due to OA and that he has  received steroid joint injections which have helped in the past.   PAIN:  Are you having pain? Yes: NPRS scale: 5-6/10 (currently when sitting), 10/10 with standing or walking long periods of time   Pain location: right greater trochanter  Pain description: Achy  Aggravating factors: Standing or walking for long periods of time   Relieving factors: Prednisone  for   PRECAUTIONS: None  RED FLAGS: None   WEIGHT BEARING RESTRICTIONS: No  FALLS:  Has patient fallen in last 6 months? No  LIVING ENVIRONMENT: Lives with: lives alone Lives in: House/apartment Stairs: Yes: External: 5 steps; on right going up, on left going up, and can reach both Has following equipment at home: Single point cane and wears life alert monitor    OCCUPATION: Retired, likes to Agricultural consultant at [employer]    PLOF: Independent  PATIENT GOALS: Control pain so that he can return to walking longer distances and standing for longer periods of time to cook again.   NEXT MD VISIT: 9 months; March 2026    OBJECTIVE:  Note: Objective measures were completed at Evaluation unless otherwise noted.  VITALS: BP 116/74 HR 66 SpO2 100%  DIAGNOSTIC FINDINGS:  CLINICAL DATA:  Low back pain, worse with standing. Symptoms over the last year.   EXAM: MRI LUMBAR SPINE WITHOUT CONTRAST   TECHNIQUE: Multiplanar, multisequence MR imaging of the lumbar spine was performed. No intravenous contrast was administered.   COMPARISON:  Radiography 11/30/2020   FINDINGS: Segmentation:  5 lumbar type vertebral bodies.   Alignment:  No malalignment.   Vertebrae: Benign appearing hemangioma within the T12 vertebral body. Discogenic endplate marrow changes throughout the lumbar region, with some active edema at L2-3, L3-4 and L4-5, which could correlate with localized back pain.   Conus medullaris and cauda equina: Conus extends to the L1-2 level. Conus and cauda equina appear normal.   Paraspinal and other soft  tissues: Negative   Disc levels:   T11-12: Mild bulging of the disc.  No compressive stenosis.   T12-L1: Unremarkable interspace.   L1-2: Shallow protrusion of the disc with a tiny amount caudally migrated disc material to the left of midline. Minimal indentation of the thecal sac. No compressive stenosis.   L2-3: Loss of disc height. Endplate osteophytes and bulging of  the disc more prominent towards the left. Mild stenosis of the left lateral recess that could possibly cause left-sided neural compression. Discogenic endplate edema which could relate to back pain.   L3-4: Loss of disc height. Endplate osteophytes and minimal bulging of the disc. Mild facet hypertrophy on the left. Mild left foraminal stenosis.   L4-5: Loss of disc height. Endplate osteophytes and bulging of the disc. Facet and ligamentous hypertrophy. Mild stenosis of the lateral recesses and foramina but without visible neural compression. Discogenic endplate edema which could relate to back pain   L5-S1: Disc desiccation with mild loss of height. Endplate osteophytes and mild bulging of the disc. No compressive narrowing of the canal or foramina.   IMPRESSION: Degenerative disc disease throughout the lumbar region with loss of disc height. No apparent compressive stenosis of the canal. Discogenic endplate marrow changes without active edema at L2-3, L3-4 and L4-5, which could correlate with regional axial back pain.   Left lateral recess and foraminal narrowing at L2-3, left foraminal narrowing at L3-4, and bilateral lateral recess and foraminal narrowing at L4-5. None of the stenoses are severe or are seen to result in definite neural compression.   Shallow left posterolateral disc herniation at L1-2 with caudal migration of a tiny amount of disc material to the left of midline. This does not appear to cause any significant compressive effect upon the neural structures.     Electronically Signed    By: Oneil Officer M.D.   On: 07/14/2021 23:01  PATIENT SURVEYS:  Modified Oswestry:  MODIFIED OSWESTRY DISABILITY SCALE  Date: 11/27/23 Score  Pain intensity 3 =  Pain medication provides me with moderate relief from pain.  2. Personal care (washing, dressing, etc.) 1 =  I can take care of myself normally, but it increases my pain.  3. Lifting 3 = Pain prevents me from lifting heavy weights, but I can manage (5) I have hardly any social life because of my pain. light to medium weights if they are conveniently positioned  4. Walking 1 = Pain prevents me from walking more than 1 mile.  5. Sitting 2 =  Pain prevents me from sitting more than 1 hour.  6. Standing 2 =  Pain prevents me from standing more than 1 hour  7. Sleeping 0 = Pain does not prevent me from sleeping well.  8. Social Life 2 = Pain prevents me from participating in more energetic activities (eg. sports, dancing).  9. Traveling 1 =  I can travel anywhere, but it increases my pain.  10. Employment/ Homemaking 1 = My normal homemaking/job activities increase my pain, but I can still perform all that is required of me  Total 16/50= 32%   Interpretation of scores: Score Category Description  0-20% Minimal Disability The patient can cope with most living activities. Usually no treatment is indicated apart from advice on lifting, sitting and exercise  21-40% Moderate Disability The patient experiences more pain and difficulty with sitting, lifting and standing. Travel and social life are more difficult and they may be disabled from work. Personal care, sexual activity and sleeping are not grossly affected, and the patient can usually be managed by conservative means  41-60% Severe Disability Pain remains the main problem in this group, but activities of daily living are affected. These patients require a detailed investigation  61-80% Crippled Back pain impinges on all aspects of the patient's life. Positive intervention is required   81-100% Bed-bound  These patients are either bed-bound  or exaggerating their symptoms  Bluford FORBES Zoe DELENA Karon DELENA, et al. Surgery versus conservative management of stable thoracolumbar fracture: the PRESTO feasibility RCT. [place]): [employer]; 2021 Nov. [facility] Technology Assessment, No. 25.62.) Appendix 3, Oswestry Disability Index category descriptors. Available from: FindJewelers.cz  Minimally Clinically Important Difference (MCID) = 12.8%  COGNITION: Overall cognitive status: Within functional limits for tasks assessed     SENSATION: WFL  MUSCLE LENGTH: Hamstrings: Right 80 deg; Left 80 deg Ely's: Positive Bilateral   POSTURE: rounded shoulders and forward head  PALPATION: Right glute med    LUMBAR ROM:   AROM eval  Flexion 100%  Extension 75%*  Right lateral flexion 100%*  Left lateral flexion 100%  Right rotation 100%  Left rotation 100%   (Blank rows = not tested)  LOWER EXTREMITY ROM:     Active  Right eval Left eval  Hip flexion    Hip extension    Hip abduction    Hip adduction    Hip internal rotation    Hip external rotation    Knee flexion    Knee extension    Ankle dorsiflexion    Ankle plantarflexion    Ankle inversion    Ankle eversion     (Blank rows = not tested)  LOWER EXTREMITY MMT:    MMT Right eval Left eval  Hip flexion 4 4  Hip extension 3 3  Hip abduction 4 4  Hip adduction 4 4  Hip internal rotation 4 4  Hip external rotation 4 4  Knee flexion 4 4  Knee extension 4 4  Ankle dorsiflexion 4+ 4+  Ankle plantarflexion    Ankle inversion    Ankle eversion     (Blank rows = not tested)  LUMBAR SPECIAL TESTS:  Straight leg raise test: Negative, SI Compression/distraction test: Negative, FABER test: Positive Bilateral , and FADIR + RLE   FUNCTIONAL TESTS:  6 minute walk test: NT  10 meter walk test: NT   GAIT:  Not assessed    TREATMENT DATE:    12/26/23: THEREX Nu-Step seat and arms at 11 for 5 min and resistance at 3  RLE IT band stretch 3 x 60 sec   -Pt reports sharp pain at first and then after second rep this pain went away   Seated QL Stretch 3 x 60 sec    -Chair provided for UE support due to pt's shoulder fatigue     GAIT TRAINING   Step through gait pattern 10 M  x 20 -Use of Trekking pole for improved gait efficiency   -Step with right foot and keep pole in left hand and step and swing pole forward simultaneously      12/24/23: THEREX Nu-Step seat and arms at 11 for 5 min and resistance at 3  Lower Trunk Rotation with 3 sec hold 2 x 10   Single Knee to Chest 3 sec hold 2 x 10   Dead Bug 2 x 10   -Pt reports increased low back pain  Curl Ups with Reach 1 x 10  Curl Ups with Reach to opposite knee 2 x 10  Supine Pec Stretch with towels rolled underneath patient 2 x 60 sec    -#2 DB placed in hands for increased pronation for a stretch   Standing Hip Abduction with BUE support 2 x 10   -min VC to keep toe pointed forward   Standing Hip Abduction with BUE support with yellow band 2 x 10  Standing Hip Abduction with BUE support with #3 AW 2 x 10     PATIENT EDUCATION:  Education details: Form and technique for correct performance of exercise and explanation about directional pattern for movement Person educated: Patient Education method: Explanation, Demonstration, Verbal cues, and Handouts Education comprehension: verbalized understanding, returned demonstration, and verbal cues required  HOME EXERCISE PROGRAM:  Access Code: [email or web address] URL: [email or web address] Date: 12/26/2023 Prepared by: Toribio Servant  Exercises - Seated Hamstring Stretch with Strap  - 1 x daily - 7 x weekly - 3 reps - 60 sec  hold - Sidelying ITB Stretch off Table  - 1 x daily - 7 x weekly - 3 reps - 60 sec hold - Supine Lower Trunk Rotation  - 1 x daily - 7 x weekly - 2 sets - 10 reps - 3 sec hold - Supine Shoulder  External and Internal Rotation in Abduction with Dumbbell  - 1 x daily - 7 x weekly - 2 sets - 10 reps - 3-5 sec  hold - Seated Quadratus Lumborum Stretch in Chair  - 1 x daily - 7 x weekly - 3 reps - 60 sec hold - Cat Cow  - 1 x daily - 7 x weekly - 2 sets - 10 reps - 3 sec  hold - Prone Press Up On Elbows  - 1 x daily - 7 x weekly - 2 sets - 10 reps - 5 sec   hold - Diagonal Curl Up with Reach  - 3-4 x weekly - 3 sets - 10 reps - Plank with Hip Extension on Counter  - 3-4 x weekly - 3 sets - 10 reps - Shoulder PNF D2 Flexion  - 3-4 x weekly - 3 sets - 10 reps - Standing Hip Abduction with Counter Support  - 3-4 x weekly - 3 sets - 10 reps  ASSESSMENT:  CLINICAL IMPRESSION: Pt exhibits a decrease in right sided low back and hip pain when walking using trekking poles. He demonstrates increased muscular tension in right hip that is contributing to his pain and the pain was relieved after performing stretches. PT recommended that pt purchase trekking poles and that he utilize trekking poles when ambulating. 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    OBJECTIVE IMPAIRMENTS: decreased knowledge of condition, difficulty walking, decreased ROM, decreased strength, hypomobility, impaired flexibility, postural dysfunction, and pain.   ACTIVITY LIMITATIONS: carrying, lifting, bending, sitting, standing, squatting, stairs, bathing, hygiene/grooming, and locomotion level  PARTICIPATION LIMITATIONS: cleaning, laundry, shopping, community activity, and yard work  PERSONAL FACTORS: Age, Time since onset of injury/illness/exacerbation, and 3+ comorbidities: Parkinson's disease, Hypothyroidism, h/o lumbar spondylosis   are also affecting patient's functional outcome.   REHAB  POTENTIAL: Fair chronicity of condition and repeated past bouts of physical therapy    CLINICAL DECISION MAKING: Stable/uncomplicated  EVALUATION COMPLEXITY: Low   GOALS: Goals reviewed with patient? No  SHORT TERM GOALS: Target date: 12/11/2023  Patient will demonstrate undestanding of home exercise plan by performing exercises correctly with evidence of good carry over with min to no verbal or tactile cues .   Baseline: NT  12/10/23: Performing independently  Goal status: ACHIEVED        LONG TERM GOALS: Target date: 02/19/2024  Patient will improve modified Oswestry Disability Index (  MODI) score by >=13% as evidence of the minimal statistically significant change for improvement with low back pain disability and improvement in low back function (Copay et al, 2008) Baseline: 16/50 (32%) Goal status: ONGOING   2.  Patient will improve hip strength by 1/3 grade MMT (I.E 4- to 4+) and abdominal strength by >= 1 grade for improved lumbar stability and to assist low back in absorbing mechanical forces in weight bearing positions for improved symptom response. Baseline: Hip Flex  R/L 4/4 , Hip Ext R/L 3/3, Hip Abd  R/L 4/4, Sah[place]ann Level 3     Goal status: ONGOING   3. Patient will be able to ambulate for 6 minutes without needing a standing or seated rest break due to pain and he will improve his distance by >=82 meters (269 ft) as evidence of an improved minimal detectable change or progress in aerobic endurance and lumbar function (Steffy and Seney 2008).  Baseline: 1,400 ft  Goal status: ONGOING     PLAN:  PT FREQUENCY: 1-2x/week  PT DURATION: 12 weeks  PLANNED INTERVENTIONS: 97164- PT Re-evaluation, 97750- Physical Performance Testing, 97110-Therapeutic exercises, 97530- Therapeutic activity, 97112- Neuromuscular re-education, 97535- Self Care, 02859- Manual therapy, [phone]- Gait training, [phone]- Aquatic Therapy, [phone]- Electrical stimulation (unattended), [phone]- Electrical  stimulation (manual), [id number]- Traction (mechanical), 20560 (1-2 muscles), 20561 (3+ muscles)- Dry Needling, Patient/Family education, Balance training, Stair training, Taping, Joint mobilization, Joint manipulation, Spinal manipulation, Spinal mobilization, Vestibular training, DME instructions, Cryotherapy, and Moist heat.  PLAN FOR NEXT SESSION:   Warm up using treadmill and review RPE. Standing postural exercises: standing marches and hamstring curl. OMEGA machines leg press or Matrix hip machines. Ongoing stretches. Walking with poles and metronome for cadence.   Toribio Servant PT, DPT  [facility] [address] [place], [address] Phone: [phone]   Fax:  [phone]

## 2023-12-28 ENCOUNTER — Other Ambulatory Visit: Payer: Self-pay | Admitting: Family Medicine

## 2023-12-31 ENCOUNTER — Ambulatory Visit: Attending: Neurology | Admitting: Physical Therapy

## 2023-12-31 DIAGNOSIS — Z961 Presence of intraocular lens: Secondary | ICD-10-CM | POA: Diagnosis not present

## 2023-12-31 DIAGNOSIS — R293 Abnormal posture: Secondary | ICD-10-CM | POA: Diagnosis not present

## 2023-12-31 DIAGNOSIS — M5459 Other low back pain: Secondary | ICD-10-CM | POA: Insufficient documentation

## 2023-12-31 DIAGNOSIS — R262 Difficulty in walking, not elsewhere classified: Secondary | ICD-10-CM | POA: Diagnosis not present

## 2023-12-31 DIAGNOSIS — H1132 Conjunctival hemorrhage, left eye: Secondary | ICD-10-CM | POA: Diagnosis not present

## 2023-12-31 NOTE — Therapy (Signed)
 OUTPATIENT PHYSICAL THERAPY THORACOLUMBAR TREATMENT   Patient Name: Daniel [name] MRN: [id number] DOB:27-Sep-1950, 73 y.o., male Today's Date: 12/31/2023  END OF SESSION:  PT End of Session - 12/31/23 1648     Visit Number 8    Number of Visits 24    Date for PT Re-Evaluation 02/19/24    Authorization Type [id number]    Authorization - Visit Number 8    Authorization - Number of Visits 24    Progress Note Due on Visit 10    PT Start Time 1645    PT Stop Time 1730    PT Time Calculation (min) 45 min    Activity Tolerance Patient tolerated treatment well    Behavior During Therapy [facility] for tasks assessed/performed           Past Medical History:  Diagnosis Date   Actinic keratosis    Allergy FROM CHILDHOOD   Arthritis    Asthma    COVID-19 01/03/2023   Eczema    GERD (gastroesophageal reflux disease)    Hx of dysplastic nevus 06/06/2006   Mid lower back, slight atypia   Hypothyroidism    Seasonal allergies    Thyroid  disease    Past Surgical History:  Procedure Laterality Date   CATARACT EXTRACTION     COLON SURGERY     COLONOSCOPY WITH PROPOFOL  N/A 11/18/2014   Procedure: COLONOSCOPY WITH PROPOFOL ;  Surgeon: Lamar ONEIDA Holmes, MD;  Location: [facility] ENDOSCOPY;  Service: Endoscopy;  Laterality: N/A;   COLONOSCOPY WITH PROPOFOL  N/A 12/13/2021   Procedure: COLONOSCOPY WITH PROPOFOL ;  Surgeon: Maryruth Ole ONEIDA, MD;  Location: [facility] ENDOSCOPY;  Service: Endoscopy;  Laterality: N/A;   EYE SURGERY     KNEE SURGERY     NASAL SEPTUM SURGERY     Patient Active Problem List   Diagnosis Date Noted   Parkinson's disease (HCC) 09/26/2022   Chronic idiopathic constipation 06/05/2022   Paroxysmal atrial fibrillation (HCC) 03/28/2022   Spondylolysis, lumbar region 03/28/2022   Avitaminosis D 03/28/2022   Myalgia due to statin 03/28/2022   Acquired thrombophilia (HCC) 03/28/2022   Hyperlipidemia 03/28/2022   Abnormal gait 03/28/2022   Mild intermittent asthma without  complication 03/28/2022   Zenker's (hypopharyngeal) diverticulum 03/22/2018   Gastroesophageal reflux disease 03/22/2018   Fatty liver 10/09/2017   BPH (benign prostatic hyperplasia) 11/21/2014   Hypothyroidism 11/21/2014   Inguinal hernia 11/21/2014   OA (osteoarthritis) 11/21/2014   H/O adenomatous polyp of colon 10/14/2014    PCP: Dr. Jon Eva    REFERRING PROVIDER: Dr. Jannett Fairly   REFERRING DIAG: [phone] (ICD-10-CM) - DDD (degenerative disc disease), lumbar  Rationale for Evaluation and Treatment: Rehabilitation  THERAPY DIAG:  Other low back pain  Difficulty in walking, not elsewhere classified  ONSET DATE: 3+ years ago (around time of COVID)    SUBJECTIVE:  SUBJECTIVE STATEMENT: Pt reports experiencing increased low back pain at the start of the session due to being on his feet most of the weekend.    PERTINENT HISTORY:  Pt reports that he started to experience low back pain a couple of years ago when he was walking during the time of COVID. Pain starts around the lower right hip and then spreads to his shoulder blades as the day goes on or the longer he is on his feet. He usually wears a back brace, which helps some. He is walking two blocks every day to build back to walking two miles per day. Patient was walking two miles a day for 5 out of 7 days per week and he was doing this up until this time of COVID. He feels that his balance is ok and that he has not had any falls in the past 6 months and he carries walking stick with him. He also has increased right knee pain that is due to OA and that he has received steroid joint injections which have helped in the past.   PAIN:  Are you having pain? Yes: NPRS scale: 4/10 (currently when sitting), 10/10 with standing or walking long  periods of time   Pain location: right greater trochanter  Pain description: Achy  Aggravating factors: Standing or walking for long periods of time   Relieving factors: Prednisone  for   PRECAUTIONS: None  RED FLAGS: None   WEIGHT BEARING RESTRICTIONS: No  FALLS:  Has patient fallen in last 6 months? No  LIVING ENVIRONMENT: Lives with: lives alone Lives in: House/apartment Stairs: Yes: External: 5 steps; on right going up, on left going up, and can reach both Has following equipment at home: Single point cane and wears life alert monitor    OCCUPATION: Retired, likes to Agricultural consultant at [employer]    PLOF: Independent  PATIENT GOALS: Control pain so that he can return to walking longer distances and standing for longer periods of time to cook again.   NEXT MD VISIT: 9 months; March 2026    OBJECTIVE:  Note: Objective measures were completed at Evaluation unless otherwise noted.  VITALS: BP 116/74 HR 66 SpO2 100%  DIAGNOSTIC FINDINGS:  CLINICAL DATA:  Low back pain, worse with standing. Symptoms over the last year.   EXAM: MRI LUMBAR SPINE WITHOUT CONTRAST   TECHNIQUE: Multiplanar, multisequence MR imaging of the lumbar spine was performed. No intravenous contrast was administered.   COMPARISON:  Radiography 11/30/2020   FINDINGS: Segmentation:  5 lumbar type vertebral bodies.   Alignment:  No malalignment.   Vertebrae: Benign appearing hemangioma within the T12 vertebral body. Discogenic endplate marrow changes throughout the lumbar region, with some active edema at L2-3, L3-4 and L4-5, which could correlate with localized back pain.   Conus medullaris and cauda equina: Conus extends to the L1-2 level. Conus and cauda equina appear normal.   Paraspinal and other soft tissues: Negative   Disc levels:   T11-12: Mild bulging of the disc.  No compressive stenosis.   T12-L1: Unremarkable interspace.   L1-2: Shallow protrusion of the disc with a  tiny amount caudally migrated disc material to the left of midline. Minimal indentation of the thecal sac. No compressive stenosis.   L2-3: Loss of disc height. Endplate osteophytes and bulging of the disc more prominent towards the left. Mild stenosis of the left lateral recess that could possibly cause left-sided neural compression. Discogenic endplate edema which could relate to back pain.  L3-4: Loss of disc height. Endplate osteophytes and minimal bulging of the disc. Mild facet hypertrophy on the left. Mild left foraminal stenosis.   L4-5: Loss of disc height. Endplate osteophytes and bulging of the disc. Facet and ligamentous hypertrophy. Mild stenosis of the lateral recesses and foramina but without visible neural compression. Discogenic endplate edema which could relate to back pain   L5-S1: Disc desiccation with mild loss of height. Endplate osteophytes and mild bulging of the disc. No compressive narrowing of the canal or foramina.   IMPRESSION: Degenerative disc disease throughout the lumbar region with loss of disc height. No apparent compressive stenosis of the canal. Discogenic endplate marrow changes without active edema at L2-3, L3-4 and L4-5, which could correlate with regional axial back pain.   Left lateral recess and foraminal narrowing at L2-3, left foraminal narrowing at L3-4, and bilateral lateral recess and foraminal narrowing at L4-5. None of the stenoses are severe or are seen to result in definite neural compression.   Shallow left posterolateral disc herniation at L1-2 with caudal migration of a tiny amount of disc material to the left of midline. This does not appear to cause any significant compressive effect upon the neural structures.     Electronically Signed   By: Oneil Officer M.D.   On: 07/14/2021 23:01  PATIENT SURVEYS:  Modified Oswestry:  MODIFIED OSWESTRY DISABILITY SCALE  Date: 11/27/23 Score  Pain intensity 3 =  Pain medication  provides me with moderate relief from pain.  2. Personal care (washing, dressing, etc.) 1 =  I can take care of myself normally, but it increases my pain.  3. Lifting 3 = Pain prevents me from lifting heavy weights, but I can manage (5) I have hardly any social life because of my pain. light to medium weights if they are conveniently positioned  4. Walking 1 = Pain prevents me from walking more than 1 mile.  5. Sitting 2 =  Pain prevents me from sitting more than 1 hour.  6. Standing 2 =  Pain prevents me from standing more than 1 hour  7. Sleeping 0 = Pain does not prevent me from sleeping well.  8. Social Life 2 = Pain prevents me from participating in more energetic activities (eg. sports, dancing).  9. Traveling 1 =  I can travel anywhere, but it increases my pain.  10. Employment/ Homemaking 1 = My normal homemaking/job activities increase my pain, but I can still perform all that is required of me  Total 16/50= 32%   Interpretation of scores: Score Category Description  0-20% Minimal Disability The patient can cope with most living activities. Usually no treatment is indicated apart from advice on lifting, sitting and exercise  21-40% Moderate Disability The patient experiences more pain and difficulty with sitting, lifting and standing. Travel and social life are more difficult and they may be disabled from work. Personal care, sexual activity and sleeping are not grossly affected, and the patient can usually be managed by conservative means  41-60% Severe Disability Pain remains the main problem in this group, but activities of daily living are affected. These patients require a detailed investigation  61-80% Crippled Back pain impinges on all aspects of the patient's life. Positive intervention is required  81-100% Bed-bound  These patients are either bed-bound or exaggerating their symptoms  Bluford FORBES Zoe DELENA Karon DELENA, et al. Surgery versus conservative management of stable  thoracolumbar fracture: the PRESTO feasibility RCT. [place] ([place]): [employer]; 2021 Nov. (  Health Technology Assessment, No. 25.62.) Appendix 3, Oswestry Disability Index category descriptors. Available from: FindJewelers.cz  Minimally Clinically Important Difference (MCID) = 12.8%  COGNITION: Overall cognitive status: Within functional limits for tasks assessed     SENSATION: WFL  MUSCLE LENGTH: Hamstrings: Right 80 deg; Left 80 deg Ely's: Positive Bilateral   POSTURE: rounded shoulders and forward head  PALPATION: Right glute med    LUMBAR ROM:   AROM eval  Flexion 100%  Extension 75%*  Right lateral flexion 100%*  Left lateral flexion 100%  Right rotation 100%  Left rotation 100%   (Blank rows = not tested)  LOWER EXTREMITY ROM:     Active  Right eval Left eval  Hip flexion    Hip extension    Hip abduction    Hip adduction    Hip internal rotation    Hip external rotation    Knee flexion    Knee extension    Ankle dorsiflexion    Ankle plantarflexion    Ankle inversion    Ankle eversion     (Blank rows = not tested)  LOWER EXTREMITY MMT:    MMT Right eval Left eval  Hip flexion 4 4  Hip extension 3 3  Hip abduction 4 4  Hip adduction 4 4  Hip internal rotation 4 4  Hip external rotation 4 4  Knee flexion 4 4  Knee extension 4 4  Ankle dorsiflexion 4+ 4+  Ankle plantarflexion    Ankle inversion    Ankle eversion     (Blank rows = not tested)  LUMBAR SPECIAL TESTS:  Straight leg raise test: Negative, SI Compression/distraction test: Negative, FABER test: Positive Bilateral , and FADIR + RLE   FUNCTIONAL TESTS:  6 minute walk test: NT  10 meter walk test: NT   GAIT:  Not assessed    TREATMENT DATE:   12/31/23:   THEREX Nu-Step seat and arms at 13 for 5 min and resistance at 3  Standing Hip Extension with Green Band alternating 1 x 10   Standing Hip Extension non-alternating with green band  with forward lean 1 x 10  -Pt shows less ROM on LLE compared to RLE   Standing Hip Extension non-alternating with forward lean AROM 1 x 10  -Pt shows increased hip extension ROM compared to when wearing green band  Quadruped Hip Extension on LLE with bent knee 1 x 10   -Pt unable to raise LLE against gravity    Standing Hip Extension on LLE with red band for resistance with BUE support  1 x 10   Standing Hip Extension on RLE with red band for resistance with 1 UE support 1 x 10  -Pt unable to consistently perform due to imbalance  Palloff Press on OMEGA with #5 2 x 10   -min VC to maintain  Palloff Press with green band 2 x 10   -Pt reports feeling tightening in his abdominals and like it is working his shoulders. Lumbar Flexion AROM [facility], Right, and Left 3 x 30    Wall Squat with Ball for Assistance 1 x 10  -Pt reports increase pain in right knee   Seated HS Stretch 4 x 60 sec   -mod VC to maintain straight knee and to stop bouncing through stretch      PATIENT EDUCATION:  Education details: Form and technique for correct performance of exercise and explanation about directional pattern for movement Person educated: Patient Education method: Explanation, Demonstration,  Verbal cues, and Handouts Education comprehension: verbalized understanding, returned demonstration, and verbal cues required  HOME EXERCISE PROGRAM:  Access Code: [email or web address] URL: [email or web address] Date: 12/31/2023 Prepared by: Toribio Servant  Exercises - Seated Hamstring Stretch with Strap  - 1 x daily - 7 x weekly - 3 reps - 60 sec  hold - Seated Hamstring Stretch  - 1 x daily - 7 x weekly - 4 reps - 60 sec hold - Sidelying ITB Stretch off Table  - 1 x daily - 7 x weekly - 3 reps - 60 sec hold - Supine Lower Trunk Rotation  - 1 x daily - 7 x weekly - 2 sets - 10 reps - 3 sec hold - Supine Shoulder External and Internal Rotation in Abduction with Dumbbell  - 1 x daily - 7  x weekly - 2 sets - 10 reps - 3-5 sec  hold - Seated Quadratus Lumborum Stretch in Chair  - 1 x daily - 7 x weekly - 3 reps - 60 sec hold - Cat Cow  - 1 x daily - 7 x weekly - 2 sets - 10 reps - 3 sec  hold - Prone Press Up On Elbows  - 1 x daily - 7 x weekly - 2 sets - 10 reps - 5 sec   hold - Diagonal Curl Up with Reach  - 3-4 x weekly - 3 sets - 10 reps - Shoulder PNF D2 Flexion  - 3-4 x weekly - 3 sets - 10 reps - Standing Hip Abduction with Counter Support  - 3-4 x weekly - 3 sets - 10 reps - Hip Extension with Resistance Loop  - 3-4 x weekly - 3 sets - 10 reps - Standing Anti-Rotation Press with Anchored Resistance  - 3-4 x weekly - 3 sets - 10 reps ASSESSMENT:  CLINICAL IMPRESSION: Pt exhibits a decrease in low back pain from the beginning to the end of the session likely from increased lumbar motion and further indicating ongoing arthritis in lumbar spine. He continues to show ongoing left. hip extensor weakness as evidenced by compensation with exercise with knee flexion. He continues to be limited in extensor strengthening due to increased right knee pain with knee flexion in weight bearing. 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     OBJECTIVE IMPAIRMENTS: decreased knowledge of condition, difficulty walking, decreased ROM, decreased strength, hypomobility, impaired flexibility, postural dysfunction, and pain.   ACTIVITY LIMITATIONS: carrying, lifting, bending, sitting, standing, squatting, stairs, bathing, hygiene/grooming, and locomotion level  PARTICIPATION LIMITATIONS: cleaning, laundry, shopping, community activity, and yard work  PERSONAL FACTORS: Age, Time since onset of injury/illness/exacerbation, and 3+ comorbidities: Parkinson's disease,  Hypothyroidism, h/o lumbar spondylosis   are also affecting patient's functional outcome.   REHAB POTENTIAL: Fair chronicity of condition and repeated past bouts of physical therapy    CLINICAL DECISION MAKING: Stable/uncomplicated  EVALUATION COMPLEXITY: Low   GOALS: Goals reviewed with patient? No  SHORT TERM GOALS: Target date: 12/11/2023  Patient will demonstrate undestanding of home exercise plan by performing exercises correctly with evidence of good carry over with min to no verbal or tactile cues .   Baseline: NT  12/10/23: Performing independently  Goal status: ACHIEVED        LONG TERM GOALS: Target date: 02/19/2024  Patient will improve modified Oswestry Disability Index (MODI) score by >=13% as evidence of the minimal statistically significant change for improvement with low back pain disability and improvement in low back function (Copay et al, 2008) Baseline: 16/50 (32%) Goal status: ONGOING   2.  Patient will improve hip strength by 1/3 grade MMT (I.E 4- to 4+) and abdominal strength by >= 1 grade for improved lumbar stability and to assist low back in absorbing mechanical forces in weight bearing positions for improved symptom response. Baseline: Hip Flex  R/L 4/4 , Hip Ext R/L 3/3, Hip Abd  R/L 4/4, Saharmann Level 3     Goal status: ONGOING   3. Patient will be able to ambulate for 6 minutes without needing a standing or seated rest break due to pain and he will improve his distance by >=82 meters (269 ft) as evidence of an improved minimal detectable change or progress in aerobic endurance and lumbar function (Steffy and Seney 2008).  Baseline: 1,400 ft  Goal status: ONGOING     PLAN:  PT FREQUENCY: 1-2x/week  PT DURATION: 12 weeks  PLANNED INTERVENTIONS: 97164- PT Re-evaluation, 97750- Physical Performance Testing, 97110-Therapeutic exercises, 97530- Therapeutic activity, 97112- Neuromuscular re-education, 97535- Self Care, 02859- Manual therapy, [phone]-  Gait training, [phone]- Aquatic Therapy, [phone]- Electrical stimulation (unattended), [phone]- Electrical stimulation (manual), [id number]- Traction (mechanical), 20560 (1-2 muscles), 20561 (3+ muscles)- Dry Needling, Patient/Family education, Balance training, Stair training, Taping, Joint mobilization, Joint manipulation, Spinal manipulation, Spinal mobilization, Vestibular training, DME instructions, Cryotherapy, and Moist heat.  PLAN FOR NEXT SESSION:   Warm up using treadmill and review RPE. Standing postural exercises: standing marches and hamstring curl. OMEGA machines leg press or Matrix hip machines. Ongoing stretches. Walking with poles and metronome for cadence. Progress hip abduction strengthening exercise.   Toribio Servant PT, DPT  [facility] [address] [place], [address] Phone: [phone]   Fax:  [phone]

## 2024-01-02 ENCOUNTER — Ambulatory Visit: Admitting: Physical Therapy

## 2024-01-02 DIAGNOSIS — M5459 Other low back pain: Secondary | ICD-10-CM | POA: Diagnosis not present

## 2024-01-02 DIAGNOSIS — R262 Difficulty in walking, not elsewhere classified: Secondary | ICD-10-CM

## 2024-01-02 DIAGNOSIS — R293 Abnormal posture: Secondary | ICD-10-CM | POA: Diagnosis not present

## 2024-01-02 NOTE — Therapy (Signed)
 OUTPATIENT PHYSICAL THERAPY THORACOLUMBAR TREATMENT   Patient Name: Daniel [name] MRN: [id number] DOB:1950/07/11, 73 y.o., male Today's Date: 01/02/2024  END OF SESSION:  PT End of Session - 01/02/24 0915     Visit Number 9    Number of Visits 24    Date for PT Re-Evaluation 02/19/24    Authorization Type [employer] [id number]    Authorization - Visit Number 9    Authorization - Number of Visits 24    Progress Note Due on Visit 10    PT Start Time 0905    PT Stop Time 0945    PT Time Calculation (min) 40 min    Activity Tolerance Patient tolerated treatment well    Behavior During Therapy [facility] for tasks assessed/performed           Past Medical History:  Diagnosis Date   Actinic keratosis    Allergy FROM CHILDHOOD   Arthritis    Asthma    COVID-19 01/03/2023   Eczema    GERD (gastroesophageal reflux disease)    Hx of dysplastic nevus 06/06/2006   Mid lower back, slight atypia   Hypothyroidism    Seasonal allergies    Thyroid  disease    Past Surgical History:  Procedure Laterality Date   CATARACT EXTRACTION     COLON SURGERY     COLONOSCOPY WITH PROPOFOL  N/A 11/18/2014   Procedure: COLONOSCOPY WITH PROPOFOL ;  Surgeon: Lamar ONEIDA Holmes, MD;  Location: [facility] ENDOSCOPY;  Service: Endoscopy;  Laterality: N/A;   COLONOSCOPY WITH PROPOFOL  N/A 12/13/2021   Procedure: COLONOSCOPY WITH PROPOFOL ;  Surgeon: Maryruth Ole ONEIDA, MD;  Location: [facility] ENDOSCOPY;  Service: Endoscopy;  Laterality: N/A;   EYE SURGERY     KNEE SURGERY     NASAL SEPTUM SURGERY     Patient Active Problem List   Diagnosis Date Noted   Parkinson's disease (HCC) 09/26/2022   Chronic idiopathic constipation 06/05/2022   Paroxysmal atrial fibrillation (HCC) 03/28/2022   Spondylolysis, lumbar region 03/28/2022   Avitaminosis D 03/28/2022   Myalgia due to statin 03/28/2022   Acquired thrombophilia (HCC) 03/28/2022   Hyperlipidemia 03/28/2022   Abnormal gait 03/28/2022   Mild intermittent asthma without  complication 03/28/2022   Zenker's (hypopharyngeal) diverticulum 03/22/2018   Gastroesophageal reflux disease 03/22/2018   Fatty liver 10/09/2017   BPH (benign prostatic hyperplasia) 11/21/2014   Hypothyroidism 11/21/2014   Inguinal hernia 11/21/2014   OA (osteoarthritis) 11/21/2014   H/O adenomatous polyp of colon 10/14/2014    PCP: Dr. Jon Eva    REFERRING PROVIDER: Dr. Jannett Fairly   REFERRING DIAG: [phone] (ICD-10-CM) - DDD (degenerative disc disease), lumbar  Rationale for Evaluation and Treatment: Rehabilitation  THERAPY DIAG:  Other low back pain  Difficulty in walking, not elsewhere classified  ONSET DATE: 3+ years ago (around time of COVID)    SUBJECTIVE:  SUBJECTIVE STATEMENT: Pt states that he has been able to walk 3 blocks without    PERTINENT HISTORY:  Pt reports that he started to experience low back pain a couple of years ago when he was walking during the time of COVID. Pain starts around the lower right hip and then spreads to his shoulder blades as the day goes on or the longer he is on his feet. He usually wears a back brace, which helps some. He is walking two blocks every day to build back to walking two miles per day. Patient was walking two miles a day for 5 out of 7 days per week and he was doing this up until this time of COVID. He feels that his balance is ok and that he has not had any falls in the past 6 months and he carries walking stick with him. He also has increased right knee pain that is due to OA and that he has received steroid joint injections which have helped in the past.   PAIN:  Are you having pain? Yes: NPRS scale: 4/10 (currently when sitting), 10/10 with standing or walking long periods of time   Pain location: right greater trochanter  Pain  description: Achy  Aggravating factors: Standing or walking for long periods of time   Relieving factors: Prednisone  for   PRECAUTIONS: None  RED FLAGS: None   WEIGHT BEARING RESTRICTIONS: No  FALLS:  Has patient fallen in last 6 months? No  LIVING ENVIRONMENT: Lives with: lives alone Lives in: House/apartment Stairs: Yes: External: 5 steps; on right going up, on left going up, and can reach both Has following equipment at home: Single point cane and wears life alert monitor    OCCUPATION: Retired, likes to Agricultural consultant at [employer]    PLOF: Independent  PATIENT GOALS: Control pain so that he can return to walking longer distances and standing for longer periods of time to cook again.   NEXT MD VISIT: 9 months; March 2026    OBJECTIVE:  Note: Objective measures were completed at Evaluation unless otherwise noted.  VITALS: BP 116/74 HR 66 SpO2 100%  DIAGNOSTIC FINDINGS:  CLINICAL DATA:  Low back pain, worse with standing. Symptoms over the last year.   EXAM: MRI LUMBAR SPINE WITHOUT CONTRAST   TECHNIQUE: Multiplanar, multisequence MR imaging of the lumbar spine was performed. No intravenous contrast was administered.   COMPARISON:  Radiography 11/30/2020   FINDINGS: Segmentation:  5 lumbar type vertebral bodies.   Alignment:  No malalignment.   Vertebrae: Benign appearing hemangioma within the T12 vertebral body. Discogenic endplate marrow changes throughout the lumbar region, with some active edema at L2-3, L3-4 and L4-5, which could correlate with localized back pain.   Conus medullaris and cauda equina: Conus extends to the L1-2 level. Conus and cauda equina appear normal.   Paraspinal and other soft tissues: Negative   Disc levels:   T11-12: Mild bulging of the disc.  No compressive stenosis.   T12-L1: Unremarkable interspace.   L1-2: Shallow protrusion of the disc with a tiny amount caudally migrated disc material to the left of midline.  Minimal indentation of the thecal sac. No compressive stenosis.   L2-3: Loss of disc height. Endplate osteophytes and bulging of the disc more prominent towards the left. Mild stenosis of the left lateral recess that could possibly cause left-sided neural compression. Discogenic endplate edema which could relate to back pain.   L3-4: Loss of disc height. Endplate osteophytes and minimal bulging  of the disc. Mild facet hypertrophy on the left. Mild left foraminal stenosis.   L4-5: Loss of disc height. Endplate osteophytes and bulging of the disc. Facet and ligamentous hypertrophy. Mild stenosis of the lateral recesses and foramina but without visible neural compression. Discogenic endplate edema which could relate to back pain   L5-S1: Disc desiccation with mild loss of height. Endplate osteophytes and mild bulging of the disc. No compressive narrowing of the canal or foramina.   IMPRESSION: Degenerative disc disease throughout the lumbar region with loss of disc height. No apparent compressive stenosis of the canal. Discogenic endplate marrow changes without active edema at L2-3, L3-4 and L4-5, which could correlate with regional axial back pain.   Left lateral recess and foraminal narrowing at L2-3, left foraminal narrowing at L3-4, and bilateral lateral recess and foraminal narrowing at L4-5. None of the stenoses are severe or are seen to result in definite neural compression.   Shallow left posterolateral disc herniation at L1-2 with caudal migration of a tiny amount of disc material to the left of midline. This does not appear to cause any significant compressive effect upon the neural structures.     Electronically Signed   By: Oneil Officer M.D.   On: 07/14/2021 23:01  PATIENT SURVEYS:  Modified Oswestry:  MODIFIED OSWESTRY DISABILITY SCALE  Date: 11/27/23 Score  Pain intensity 3 =  Pain medication provides me with moderate relief from pain.  2. Personal care  (washing, dressing, etc.) 1 =  I can take care of myself normally, but it increases my pain.  3. Lifting 3 = Pain prevents me from lifting heavy weights, but I can manage (5) I have hardly any social life because of my pain. light to medium weights if they are conveniently positioned  4. Walking 1 = Pain prevents me from walking more than 1 mile.  5. Sitting 2 =  Pain prevents me from sitting more than 1 hour.  6. Standing 2 =  Pain prevents me from standing more than 1 hour  7. Sleeping 0 = Pain does not prevent me from sleeping well.  8. Social Life 2 = Pain prevents me from participating in more energetic activities (eg. sports, dancing).  9. Traveling 1 =  I can travel anywhere, but it increases my pain.  10. Employment/ Homemaking 1 = My normal homemaking/job activities increase my pain, but I can still perform all that is required of me  Total 16/50= 32%   Interpretation of scores: Score Category Description  0-20% Minimal Disability The patient can cope with most living activities. Usually no treatment is indicated apart from advice on lifting, sitting and exercise  21-40% Moderate Disability The patient experiences more pain and difficulty with sitting, lifting and standing. Travel and social life are more difficult and they may be disabled from work. Personal care, sexual activity and sleeping are not grossly affected, and the patient can usually be managed by conservative means  41-60% Severe Disability Pain remains the main problem in this group, but activities of daily living are affected. These patients require a detailed investigation  61-80% Crippled Back pain impinges on all aspects of the patient's life. Positive intervention is required  81-100% Bed-bound  These patients are either bed-bound or exaggerating their symptoms  Bluford FORBES Zoe DELENA Karon DELENA, et al. Surgery versus conservative management of stable thoracolumbar fracture: the PRESTO feasibility RCT. [place] ([place]):  [employer]; 2021 Nov. [facility] Technology Assessment, No. 25.62.) Appendix 3, Oswestry Disability Index  category descriptors. Available from: FindJewelers.cz  Minimally Clinically Important Difference (MCID) = 12.8%  COGNITION: Overall cognitive status: Within functional limits for tasks assessed     SENSATION: WFL  MUSCLE LENGTH: Hamstrings: Right 80 deg; Left 80 deg Ely's: Positive Bilateral   POSTURE: rounded shoulders and forward head  PALPATION: Right glute med    LUMBAR ROM:   AROM eval  Flexion 100%  Extension 75%*  Right lateral flexion 100%*  Left lateral flexion 100%  Right rotation 100%  Left rotation 100%   (Blank rows = not tested)  LOWER EXTREMITY ROM:     Active  Right eval Left eval  Hip flexion    Hip extension    Hip abduction    Hip adduction    Hip internal rotation    Hip external rotation    Knee flexion    Knee extension    Ankle dorsiflexion    Ankle plantarflexion    Ankle inversion    Ankle eversion     (Blank rows = not tested)  LOWER EXTREMITY MMT:    MMT Right eval Left eval  Hip flexion 4 4  Hip extension 3 3  Hip abduction 4 4  Hip adduction 4 4  Hip internal rotation 4 4  Hip external rotation 4 4  Knee flexion 4 4  Knee extension 4 4  Ankle dorsiflexion 4+ 4+  Ankle plantarflexion    Ankle inversion    Ankle eversion     (Blank rows = not tested)  LUMBAR SPECIAL TESTS:  Straight leg raise test: Negative, SI Compression/distraction test: Negative, FABER test: Positive Bilateral , and FADIR + RLE   FUNCTIONAL TESTS:  6 minute walk test: NT  10 meter walk test: NT   GAIT:  Not assessed    TREATMENT DATE:   01/02/24: THEREX Lower Trunk Rotation 2 x 10 with 2 sec hold    Single Knee to Chest  2 x 10 with 3 sec hold    Modified Straight Leg Raise 2 x 10   Modified Straight Leg  Raise #3 AW 2 x 10   Active Straight Leg Raise with yellow band 2 x 10   -min to maintain  extended knee  Matrix Rotary Hip-  Hip Extension   #40 1 x 10 on RLE   #55 3 x 10  on LLE Matrix Rotary Hip- Hip Abduction  #40 3 x 10   -Pt reports increased soreness in his right QL Standing triangle pose 2 X 30 SEC  -Pt reports increased pain  Seated QL Stretch 2 X 60 sec  -min VC to increase reach to the side.     PATIENT EDUCATION:  Education details: Form and technique for correct performance of exercise and explanation about directional pattern for movement Person educated: Patient Education method: Explanation, Demonstration, Verbal cues, and Handouts Education comprehension: verbalized understanding, returned demonstration, and verbal cues required  HOME EXERCISE PROGRAM:  Access Code: [email or web address] URL: [email or web address] Date: 01/02/2024 Prepared by: Toribio Servant  Exercises - Seated Hamstring Stretch with Strap  - 1 x daily - 7 x weekly - 3 reps - 60 sec  hold - Seated Hamstring Stretch  - 1 x daily - 7 x weekly - 4 reps - 60 sec hold - Sidelying ITB Stretch off Table  - 1 x daily - 7 x weekly - 3 reps - 60 sec hold - Supine Lower Trunk Rotation  - 1 x daily - 7 x weekly - 2 sets -  10 reps - 3 sec hold - Supine Shoulder External and Internal Rotation in Abduction with Dumbbell  - 1 x daily - 7 x weekly - 2 sets - 10 reps - 3-5 sec  hold - Seated Quadratus Lumborum Stretch in Chair  - 1 x daily - 7 x weekly - 3 reps - 60 sec hold - Cat Cow  - 1 x daily - 7 x weekly - 2 sets - 10 reps - 3 sec  hold - Prone Press Up On Elbows  - 1 x daily - 7 x weekly - 2 sets - 10 reps - 5 sec   hold - Diagonal Curl Up with Reach  - 3-4 x weekly - 3 sets - 10 reps - Shoulder PNF D2 Flexion  - 3-4 x weekly - 3 sets - 10 reps - Standing Hip Abduction with Counter Support  - 3-4 x weekly - 3 sets - 10 reps - Hip Extension with Resistance Loop  - 3-4 x weekly - 3 sets - 10 reps - Standing Anti-Rotation Press with Anchored Resistance  - 3-4 x weekly - 3 sets - 10 reps -  Active Straight Leg Raise Advanced  - 3-4 x weekly - 3 sets - 10 reps  ASSESSMENT:  CLINICAL IMPRESSION: Pt progressing towards goals with decrease in hip pain with activity and increased LE strength with ability to perform exercises with increased resistance. 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   Pt exhibits a decrease in low back pain from the beginning to the end of the session likely from increased lumbar motion and further indicating ongoing arthritis in lumbar spine. He continues to show ongoing left. hip extensor weakness as evidenced by compensation with exercise with knee flexion.    OBJECTIVE IMPAIRMENTS: decreased knowledge of condition, difficulty walking, decreased ROM, decreased strength, hypomobility, impaired flexibility, postural dysfunction, and pain.   ACTIVITY LIMITATIONS: carrying, lifting, bending, sitting, standing, squatting, stairs, bathing, hygiene/grooming, and locomotion level  PARTICIPATION LIMITATIONS: cleaning, laundry, shopping, community activity, and yard work  PERSONAL FACTORS: Age, Time since onset of injury/illness/exacerbation, and 3+ comorbidities: Parkinson's disease, Hypothyroidism, h/o lumbar spondylosis   are also affecting patient's functional outcome.   REHAB POTENTIAL: Fair chronicity of condition and repeated past bouts of physical therapy    CLINICAL DECISION MAKING: Stable/uncomplicated  EVALUATION COMPLEXITY: Low   GOALS: Goals reviewed with patient? No  SHORT TERM GOALS: Target date: 12/11/2023  Patient will demonstrate undestanding of home exercise plan by performing exercises correctly with  evidence of good carry over with min to no verbal or tactile cues .   Baseline: NT  12/10/23: Performing independently  Goal status: ACHIEVED        LONG TERM GOALS: Target date: 02/19/2024  Patient will improve modified Oswestry Disability Index (MODI) score by >=13% as evidence of the minimal statistically significant change for improvement with low back pain disability and improvement in low back function (Copay et al, 2008) Baseline: 16/50 (32%) Goal status: ONGOING   2.  Patient will improve hip strength by 1/3 grade MMT (I.E 4- to 4+) and abdominal strength by >= 1 grade for improved lumbar stability and to assist low back in  absorbing mechanical forces in weight bearing positions for improved symptom response. Baseline: Hip Flex  R/L 4/4 , Hip Ext R/L 3/3, Hip Abd  R/L 4/4, [place]ann Level 3     Goal status: ONGOING   3. Patient will be able to ambulate for 6 minutes without needing a standing or seated rest break due to pain and he will improve his distance by >=82 meters (269 ft) as evidence of an improved minimal detectable change or progress in aerobic endurance and lumbar function (Steffy and Seney 2008).  Baseline: 1,400 ft  Goal status: ONGOING     PLAN:  PT FREQUENCY: 1-2x/week  PT DURATION: 12 weeks  PLANNED INTERVENTIONS: 97164- PT Re-evaluation, 97750- Physical Performance Testing, 97110-Therapeutic exercises, 97530- Therapeutic activity, 97112- Neuromuscular re-education, 97535- Self Care, 02859- Manual therapy, [phone]- Gait training, [phone]- Aquatic Therapy, [phone]- Electrical stimulation (unattended), [phone]- Electrical stimulation (manual), [id number]- Traction (mechanical), 20560 (1-2 muscles), 20561 (3+ muscles)- Dry Needling, Patient/Family education, Balance training, Stair training, Taping, Joint mobilization, Joint manipulation, Spinal manipulation, Spinal mobilization, Vestibular training, DME instructions, Cryotherapy, and Moist heat.  PLAN FOR NEXT SESSION:    Reassess goals for progress note.  Warm up using treadmill and review RPE. Standing postural exercises: standing marches and hamstring curl. OMEGA machines leg press or Matrix hip machines. Ongoing stretches. Walking with poles and metronome for cadence. Progress hip abduction strengthening exercise.   Toribio Servant PT, DPT  [facility] [address] [place], [address] Phone: [phone]   Fax:  [phone]

## 2024-01-07 ENCOUNTER — Ambulatory Visit: Admitting: Physical Therapy

## 2024-01-07 DIAGNOSIS — R262 Difficulty in walking, not elsewhere classified: Secondary | ICD-10-CM

## 2024-01-07 DIAGNOSIS — M5459 Other low back pain: Secondary | ICD-10-CM

## 2024-01-07 DIAGNOSIS — R293 Abnormal posture: Secondary | ICD-10-CM | POA: Diagnosis not present

## 2024-01-07 NOTE — Therapy (Signed)
 OUTPATIENT PHYSICAL THERAPY THORACOLUMBAR PROGRESS  Dates of Reporting: 11/27/23-01/07/24  Patient Name: Daniel [name] MRN: [id number] DOB:1950-11-10, 73 y.o., male Today's Date: 01/07/2024  END OF SESSION:  PT End of Session - 01/07/24 0917     Visit Number 10    Number of Visits 24    Date for PT Re-Evaluation 02/19/24    Authorization Type [employer] [id number]    Authorization - Visit Number 10    Authorization - Number of Visits 24    Progress Note Due on Visit 10    PT Start Time 0905    PT Stop Time 0945    PT Time Calculation (min) 40 min    Activity Tolerance Patient tolerated treatment well    Behavior During Therapy [facility] for tasks assessed/performed           Past Medical History:  Diagnosis Date   Actinic keratosis    Allergy FROM CHILDHOOD   Arthritis    Asthma    COVID-19 01/03/2023   Eczema    GERD (gastroesophageal reflux disease)    Hx of dysplastic nevus 06/06/2006   Mid lower back, slight atypia   Hypothyroidism    Seasonal allergies    Thyroid  disease    Past Surgical History:  Procedure Laterality Date   CATARACT EXTRACTION     COLON SURGERY     COLONOSCOPY WITH PROPOFOL  N/A 11/18/2014   Procedure: COLONOSCOPY WITH PROPOFOL ;  Surgeon: Lamar ONEIDA Holmes, MD;  Location: [facility] ENDOSCOPY;  Service: Endoscopy;  Laterality: N/A;   COLONOSCOPY WITH PROPOFOL  N/A 12/13/2021   Procedure: COLONOSCOPY WITH PROPOFOL ;  Surgeon: Maryruth Ole ONEIDA, MD;  Location: [facility] ENDOSCOPY;  Service: Endoscopy;  Laterality: N/A;   EYE SURGERY     KNEE SURGERY     NASAL SEPTUM SURGERY     Patient Active Problem List   Diagnosis Date Noted   Parkinson's disease (HCC) 09/26/2022   Chronic idiopathic constipation 06/05/2022   Paroxysmal atrial fibrillation (HCC) 03/28/2022   Spondylolysis, lumbar region 03/28/2022   Avitaminosis D 03/28/2022   Myalgia due to statin 03/28/2022   Acquired thrombophilia (HCC) 03/28/2022   Hyperlipidemia 03/28/2022   Abnormal gait 03/28/2022   Mild  intermittent asthma without complication 03/28/2022   Zenker's (hypopharyngeal) diverticulum 03/22/2018   Gastroesophageal reflux disease 03/22/2018   Fatty liver 10/09/2017   BPH (benign prostatic hyperplasia) 11/21/2014   Hypothyroidism 11/21/2014   Inguinal hernia 11/21/2014   OA (osteoarthritis) 11/21/2014   H/O adenomatous polyp of colon 10/14/2014    PCP: Dr. Jon Eva    REFERRING PROVIDER: Dr. Jannett Fairly   REFERRING DIAG: [phone] (ICD-10-CM) - DDD (degenerative disc disease), lumbar  Rationale for Evaluation and Treatment: Rehabilitation  THERAPY DIAG:  Other low back pain  Difficulty in walking, not elsewhere classified  ONSET DATE: 3+ years ago (around time of COVID)    SUBJECTIVE:  SUBJECTIVE STATEMENT: Pt reports low back pain and and right hip pain that is worse than normal. 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 The pain eases up after her moves around.     PERTINENT HISTORY:  Pt reports that he started to experience low back pain a couple of years ago when he was walking during the time of COVID. Pain starts around the lower right hip and then spreads to his shoulder blades as the day goes on or the longer he is on his feet. He usually wears a back brace, which helps some. He is walking two blocks every day to build back to walking two miles per day. Patient was walking two miles a day for 5 out of 7 days per week and he was doing this up until this time of COVID. He feels that his balance is ok and that he has not had any falls in the past 6 months and he carries walking stick with him. He also has increased right knee pain that is due to OA and that he has received steroid joint injections which have helped in the past.   PAIN:  Are you  having pain? Yes: NPRS scale: 4/10 (currently when sitting), 10/10 with standing or walking long periods of time   Pain location: right greater trochanter  Pain description: Achy  Aggravating factors: Standing or walking for long periods of time   Relieving factors: Prednisone  for   PRECAUTIONS: None  RED FLAGS: None   WEIGHT BEARING RESTRICTIONS: No  FALLS:  Has patient fallen in last 6 months? No  LIVING ENVIRONMENT: Lives with: lives alone Lives in: House/apartment Stairs: Yes: External: 5 steps; on right going up, on left going up, and can reach both Has following equipment at home: Single point cane and wears life alert monitor    OCCUPATION: Retired, likes to Agricultural consultant at [employer]    PLOF: Independent  PATIENT GOALS: Control pain so that he can return to walking longer distances and standing for longer periods of time to cook again.   NEXT MD VISIT: 9 months; March 2026    OBJECTIVE:  Note: Objective measures were completed at Evaluation unless otherwise noted.  VITALS: BP 116/74 HR 66 SpO2 100%  DIAGNOSTIC FINDINGS:  CLINICAL DATA:  Low back pain, worse with standing. Symptoms over the last year.   EXAM: MRI LUMBAR SPINE WITHOUT CONTRAST   TECHNIQUE: Multiplanar, multisequence MR imaging of the lumbar spine was performed. No intravenous contrast was administered.   COMPARISON:  Radiography 11/30/2020   FINDINGS: Segmentation:  5 lumbar type vertebral bodies.   Alignment:  No malalignment.   Vertebrae: Benign appearing hemangioma within the T12 vertebral body. Discogenic endplate marrow changes throughout the lumbar region, with some active edema at L2-3, L3-4 and L4-5, which could correlate with localized back pain.   Conus medullaris and cauda equina: Conus extends to the L1-2 level. Conus and cauda equina appear normal.   Paraspinal and other soft tissues: Negative   Disc levels:   T11-12: Mild bulging of the disc.  No compressive  stenosis.   T12-L1: Unremarkable interspace.   L1-2: Shallow protrusion of the disc with a tiny amount caudally migrated disc material to the left of midline. Minimal indentation of the thecal sac. No compressive stenosis.   L2-3: Loss of disc height. Endplate osteophytes and bulging of the disc more prominent towards the left. Mild stenosis of the left lateral recess that could possibly cause  left-sided neural compression. Discogenic endplate edema which could relate to back pain.   L3-4: Loss of disc height. Endplate osteophytes and minimal bulging of the disc. Mild facet hypertrophy on the left. Mild left foraminal stenosis.   L4-5: Loss of disc height. Endplate osteophytes and bulging of the disc. Facet and ligamentous hypertrophy. Mild stenosis of the lateral recesses and foramina but without visible neural compression. Discogenic endplate edema which could relate to back pain   L5-S1: Disc desiccation with mild loss of height. Endplate osteophytes and mild bulging of the disc. No compressive narrowing of the canal or foramina.   IMPRESSION: Degenerative disc disease throughout the lumbar region with loss of disc height. No apparent compressive stenosis of the canal. Discogenic endplate marrow changes without active edema at L2-3, L3-4 and L4-5, which could correlate with regional axial back pain.   Left lateral recess and foraminal narrowing at L2-3, left foraminal narrowing at L3-4, and bilateral lateral recess and foraminal narrowing at L4-5. None of the stenoses are severe or are seen to result in definite neural compression.   Shallow left posterolateral disc herniation at L1-2 with caudal migration of a tiny amount of disc material to the left of midline. This does not appear to cause any significant compressive effect upon the neural structures.     Electronically Signed   By: Oneil Officer M.D.   On: 07/14/2021 23:01  PATIENT SURVEYS:  Modified Oswestry:   MODIFIED OSWESTRY DISABILITY SCALE  Date: 11/27/23 Score  Pain intensity 3 =  Pain medication provides me with moderate relief from pain.  2. Personal care (washing, dressing, etc.) 1 =  I can take care of myself normally, but it increases my pain.  3. Lifting 3 = Pain prevents me from lifting heavy weights, but I can manage (5) I have hardly any social life because of my pain. light to medium weights if they are conveniently positioned  4. Walking 1 = Pain prevents me from walking more than 1 mile.  5. Sitting 2 =  Pain prevents me from sitting more than 1 hour.  6. Standing 2 =  Pain prevents me from standing more than 1 hour  7. Sleeping 0 = Pain does not prevent me from sleeping well.  8. Social Life 2 = Pain prevents me from participating in more energetic activities (eg. sports, dancing).  9. Traveling 1 =  I can travel anywhere, but it increases my pain.  10. Employment/ Homemaking 1 = My normal homemaking/job activities increase my pain, but I can still perform all that is required of me  Total 16/50= 32%   Interpretation of scores: Score Category Description  0-20% Minimal Disability The patient can cope with most living activities. Usually no treatment is indicated apart from advice on lifting, sitting and exercise  21-40% Moderate Disability The patient experiences more pain and difficulty with sitting, lifting and standing. Travel and social life are more difficult and they may be disabled from work. Personal care, sexual activity and sleeping are not grossly affected, and the patient can usually be managed by conservative means  41-60% Severe Disability Pain remains the main problem in this group, but activities of daily living are affected. These patients require a detailed investigation  61-80% Crippled Back pain impinges on all aspects of the patient's life. Positive intervention is required  81-100% Bed-bound  These patients are either bed-bound or exaggerating their symptoms   Bluford BRAVO, Zoe DELENA Karon DELENA, et al. Surgery versus conservative management of  stable thoracolumbar fracture: the PRESTO feasibility RCT. [place] ([place]): [employer]; 2021 Nov. [facility] Technology Assessment, No. 25.62.) Appendix 3, Oswestry Disability Index category descriptors. Available from: FindJewelers.cz  Minimally Clinically Important Difference (MCID) = 12.8%  COGNITION: Overall cognitive status: Within functional limits for tasks assessed     SENSATION: WFL  MUSCLE LENGTH: Hamstrings: Right 80 deg; Left 80 deg Ely's: Positive Bilateral   POSTURE: rounded shoulders and forward head  PALPATION: Right glute med    LUMBAR ROM:   AROM eval  Flexion 100%  Extension 75%*  Right lateral flexion 100%*  Left lateral flexion 100%  Right rotation 100%  Left rotation 100%   (Blank rows = not tested)  LOWER EXTREMITY ROM:     Active  Right eval Left eval  Hip flexion    Hip extension    Hip abduction    Hip adduction    Hip internal rotation    Hip external rotation    Knee flexion    Knee extension    Ankle dorsiflexion    Ankle plantarflexion    Ankle inversion    Ankle eversion     (Blank rows = not tested)  LOWER EXTREMITY MMT:    MMT Right eval Left eval Right  01/07/24 Left  01/07/24    Hip flexion 4 4 4 4   Hip extension (supine)  3 3 4 4   Hip abduction 4 4 4 4   Hip adduction 4 4    Hip internal rotation 4 4    Hip external rotation 4 4    Knee flexion 4 4    Knee extension 4 4    Ankle dorsiflexion 4+ 4+    Ankle plantarflexion      Ankle inversion      Ankle eversion       (Blank rows = not tested)  Sahrmann: Grade  4  LUMBAR SPECIAL TESTS:  Straight leg raise test: Negative, SI Compression/distraction test: Negative, FABER test: Positive Bilateral , and FADIR + RLE   FUNCTIONAL TESTS:  6 minute walk test: 1,400 ft  10 meter walk test: NT   GAIT:  Not assessed    TREATMENT DATE:   01/07/24 Nu-Step with seat and arms at 11 and resistance at 3 for 6 min   Lower Trunk Rotation 1 x 10 with 3 sec hold     Single Knee to Chest  1 x 10 with 3 sec hold   Modified Dead Lift  20 mat surface with 2 jugs of water 2 x 10 -min VC to maintain cervical extension to avoid rounding of thoracic spine  -min VC to maintain feet at shoulder width    Modified Dead Lift  20 mat surface with 2 x #10 DB 2 x 10  -min VC to maintain cervical extension to avoid rounding of thoracic spine  -min VC to maintain feet at shoulder width    Hip MMT and Sahrmann (See above) Modified Straight Leg Raise 2 x 10   -mod VC to maintain knee extension especially on RLE     PATIENT EDUCATION:  Education details: Form and technique for correct performance of exercise and explanation about directional pattern for movement Person educated: Patient Education method: Explanation, Demonstration, Verbal cues, and Handouts Education comprehension: verbalized understanding, returned demonstration, and verbal cues required  HOME EXERCISE PROGRAM:  Access Code: [email or web address] URL: [email or web address] Date: 01/02/2024 Prepared by: Toribio Servant  Exercises - Seated Hamstring Stretch with Strap  - 1 x daily - 7  x weekly - 3 reps - 60 sec  hold - Seated Hamstring Stretch  - 1 x daily - 7 x weekly - 4 reps - 60 sec hold - Sidelying ITB Stretch off Table  - 1 x daily - 7 x weekly - 3 reps - 60 sec hold - Supine Lower Trunk Rotation  - 1 x daily - 7 x weekly - 2 sets - 10 reps - 3 sec hold - Supine Shoulder External and Internal Rotation in Abduction with Dumbbell  - 1 x daily - 7 x weekly - 2 sets - 10 reps - 3-5 sec  hold - Seated Quadratus Lumborum Stretch in Chair  - 1 x daily - 7 x weekly - 3 reps - 60 sec hold - Cat Cow  - 1 x daily - 7 x weekly - 2 sets - 10 reps - 3 sec  hold - Prone Press Up On Elbows  - 1 x daily - 7 x weekly - 2 sets - 10 reps - 5 sec   hold - Diagonal Curl Up with Reach  -  3-4 x weekly - 3 sets - 10 reps - Shoulder PNF D2 Flexion  - 3-4 x weekly - 3 sets - 10 reps - Standing Hip Abduction with Counter Support  - 3-4 x weekly - 3 sets - 10 reps - Hip Extension with Resistance Loop  - 3-4 x weekly - 3 sets - 10 reps - Standing Anti-Rotation Press with Anchored Resistance  - 3-4 x weekly - 3 sets - 10 reps - Active Straight Leg Raise Advanced  - 3-4 x weekly - 3 sets - 10 reps  ASSESSMENT:  CLINICAL IMPRESSION: Pt showing limited progress towards goals with slight improvement in functional perception of disability due to low back pain and increase in hip extensor strength. He continues to show signs and symptoms of arthritic spinal pain with pain and stiffness decreasing with movement and that is initially worse when sitting for prolonged periods of time. Deferred performing today because of pt's initial pain and it will be performed next session. 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     OBJECTIVE IMPAIRMENTS: decreased knowledge of condition, difficulty walking, decreased ROM, decreased strength, hypomobility, impaired flexibility, postural dysfunction, and pain.   ACTIVITY LIMITATIONS: carrying, lifting, bending, sitting, standing, squatting, stairs, bathing, hygiene/grooming, and locomotion level  PARTICIPATION LIMITATIONS: cleaning, laundry, shopping, community activity, and yard work  PERSONAL FACTORS: Age, Time since onset of injury/illness/exacerbation, and 3+ comorbidities: Parkinson's disease, Hypothyroidism, h/o lumbar spondylosis   are also affecting patient's functional outcome.   REHAB POTENTIAL: Fair chronicity of condition and repeated past bouts of physical therapy    CLINICAL DECISION MAKING:  Stable/uncomplicated  EVALUATION COMPLEXITY: Low   GOALS: Goals reviewed with patient? No  SHORT TERM GOALS: Target date: 12/11/2023  Patient will demonstrate undestanding of home exercise plan by performing exercises correctly with evidence of good carry over with min to no verbal or tactile cues .   Baseline: NT  12/10/23: Performing independently  Goal status: ACHIEVED        LONG TERM GOALS: Target date: 02/19/2024  Patient will improve modified Oswestry Disability Index (MODI) score by >=13% as evidence of the minimal statistically significant change for improvement with low  back pain disability and improvement in low back function (Copay et al, 2008) Baseline: 16/50 (32%) 01/07/24: 15/50 (30%)  Goal status: ONGOING   2.  Patient will improve hip strength by 1/3 grade MMT (I.E 4- to 4+) and abdominal strength by >= 1 grade for improved lumbar stability and to assist low back in absorbing mechanical forces in weight bearing positions for improved symptom response. Baseline: Hip Flex  R/L 4/4 , Hip Ext R/L 3/3, Hip Abd  R/L 4/4, Saharmann Level 3   01/07/24: Hip Flex R/L 4/4, Hip Ext (Supine) R/L 4-/4-, Hip Abd R/L 4/4, Sahrmann level 4   Goal status: ONGOING   3. Patient will be able to ambulate for 6 minutes without needing a standing or seated rest break due to pain and he will improve his distance by >=82 meters (269 ft) as evidence of an improved minimal detectable change or progress in aerobic endurance and lumbar function (Steffy and Seney 2008).  Baseline: 1,400 ft  Goal status: ONGOING     PLAN:  PT FREQUENCY: 1-2x/week  PT DURATION: 12 weeks  PLANNED INTERVENTIONS: 97164- PT Re-evaluation, 97750- Physical Performance Testing, 97110-Therapeutic exercises, 97530- Therapeutic activity, 97112- Neuromuscular re-education, 97535- Self Care, 02859- Manual therapy, [phone]- Gait training, [phone]- Aquatic Therapy, [phone]- Electrical stimulation (unattended), [phone]- Electrical  stimulation (manual), [id number]- Traction (mechanical), 20560 (1-2 muscles), 20561 (3+ muscles)- Dry Needling, Patient/Family education, Balance training, Stair training, Taping, Joint mobilization, Joint manipulation, Spinal manipulation, Spinal mobilization, Vestibular training, DME instructions, Cryotherapy, and Moist heat.  PLAN FOR NEXT SESSION:   .  Warm up using treadmill and review RPE. Standing postural exercises: standing marches and standing hip abduction or even side lying hip abduction. Walking with poles and metronome for cadence. Progress hip abduction strengthening exercise. Add modified dead lift to home exercise plan.   Toribio Servant PT, DPT  [facility] [address] [place], [address] Phone: [phone]   Fax:  [phone]

## 2024-01-09 ENCOUNTER — Ambulatory Visit: Admitting: Physical Therapy

## 2024-01-09 DIAGNOSIS — R262 Difficulty in walking, not elsewhere classified: Secondary | ICD-10-CM | POA: Diagnosis not present

## 2024-01-09 DIAGNOSIS — M5459 Other low back pain: Secondary | ICD-10-CM | POA: Diagnosis not present

## 2024-01-09 DIAGNOSIS — R293 Abnormal posture: Secondary | ICD-10-CM | POA: Diagnosis not present

## 2024-01-09 NOTE — Therapy (Signed)
 OUTPATIENT PHYSICAL THERAPY THORACOLUMBAR TREATMENT   Patient Name: Daniel [name] MRN: [id number] DOB:November 23, 1950, 73 y.o., male Today's Date: 01/09/2024  END OF SESSION:  PT End of Session - 01/09/24 0907     Visit Number 11    Number of Visits 24    Date for PT Re-Evaluation 02/19/24    Authorization Type [id number]    Authorization - Visit Number 11    Authorization - Number of Visits 24    Progress Note Due on Visit 20    PT Start Time 0903    PT Stop Time 0945    PT Time Calculation (min) 42 min    Activity Tolerance Patient tolerated treatment well    Behavior During Therapy [facility] for tasks assessed/performed           Past Medical History:  Diagnosis Date   Actinic keratosis    Allergy FROM CHILDHOOD   Arthritis    Asthma    COVID-19 01/03/2023   Eczema    GERD (gastroesophageal reflux disease)    Hx of dysplastic nevus 06/06/2006   Mid lower back, slight atypia   Hypothyroidism    Seasonal allergies    Thyroid  disease    Past Surgical History:  Procedure Laterality Date   CATARACT EXTRACTION     COLON SURGERY     COLONOSCOPY WITH PROPOFOL  N/A 11/18/2014   Procedure: COLONOSCOPY WITH PROPOFOL ;  Surgeon: Lamar ONEIDA Holmes, MD;  Location: [facility] ENDOSCOPY;  Service: Endoscopy;  Laterality: N/A;   COLONOSCOPY WITH PROPOFOL  N/A 12/13/2021   Procedure: COLONOSCOPY WITH PROPOFOL ;  Surgeon: Maryruth Ole ONEIDA, MD;  Location: [facility] ENDOSCOPY;  Service: Endoscopy;  Laterality: N/A;   EYE SURGERY     KNEE SURGERY     NASAL SEPTUM SURGERY     Patient Active Problem List   Diagnosis Date Noted   Parkinson's disease (HCC) 09/26/2022   Chronic idiopathic constipation 06/05/2022   Paroxysmal atrial fibrillation (HCC) 03/28/2022   Spondylolysis, lumbar region 03/28/2022   Avitaminosis D 03/28/2022   Myalgia due to statin 03/28/2022   Acquired thrombophilia (HCC) 03/28/2022   Hyperlipidemia 03/28/2022   Abnormal gait 03/28/2022   Mild intermittent asthma without  complication 03/28/2022   Zenker's (hypopharyngeal) diverticulum 03/22/2018   Gastroesophageal reflux disease 03/22/2018   Fatty liver 10/09/2017   BPH (benign prostatic hyperplasia) 11/21/2014   Hypothyroidism 11/21/2014   Inguinal hernia 11/21/2014   OA (osteoarthritis) 11/21/2014   H/O adenomatous polyp of colon 10/14/2014    PCP: Dr. Jon Eva    REFERRING PROVIDER: Dr. Jannett Fairly   REFERRING DIAG: [phone] (ICD-10-CM) - DDD (degenerative disc disease), lumbar  Rationale for Evaluation and Treatment: Rehabilitation  THERAPY DIAG:  Other low back pain  Difficulty in walking, not elsewhere classified  Abnormal posture  ONSET DATE: 3+ years ago (around time of COVID)    SUBJECTIVE:  SUBJECTIVE STATEMENT: Pt states that he felt soreness after last session but no increase in joint pain per say.    PERTINENT HISTORY:  Pt reports that he started to experience low back pain a couple of years ago when he was walking during the time of COVID. Pain starts around the lower right hip and then spreads to his shoulder blades as the day goes on or the longer he is on his feet. He usually wears a back brace, which helps some. He is walking two blocks every day to build back to walking two miles per day. Patient was walking two miles a day for 5 out of 7 days per week and he was doing this up until this time of COVID. He feels that his balance is ok and that he has not had any falls in the past 6 months and he carries walking stick with him. He also has increased right knee pain that is due to OA and that he has received steroid joint injections which have helped in the past.   PAIN:  Are you having pain? Yes: NPRS scale: 3/10 (currently when sitting), 10/10 with standing or walking long periods of time    Pain location: right greater trochanter  Pain description: Achy  Aggravating factors: Standing or walking for long periods of time   Relieving factors: Prednisone  for   PRECAUTIONS: None  RED FLAGS: None   WEIGHT BEARING RESTRICTIONS: No  FALLS:  Has patient fallen in last 6 months? No  LIVING ENVIRONMENT: Lives with: lives alone Lives in: House/apartment Stairs: Yes: External: 5 steps; on right going up, on left going up, and can reach both Has following equipment at home: Single point cane and wears life alert monitor    OCCUPATION: Retired, likes to Agricultural consultant at [employer]    PLOF: Independent  PATIENT GOALS: Control pain so that he can return to walking longer distances and standing for longer periods of time to cook again.   NEXT MD VISIT: 9 months; March 2026    OBJECTIVE:  Note: Objective measures were completed at Evaluation unless otherwise noted.  VITALS: BP 116/74 HR 66 SpO2 100%  DIAGNOSTIC FINDINGS:  CLINICAL DATA:  Low back pain, worse with standing. Symptoms over the last year.   EXAM: MRI LUMBAR SPINE WITHOUT CONTRAST   TECHNIQUE: Multiplanar, multisequence MR imaging of the lumbar spine was performed. No intravenous contrast was administered.   COMPARISON:  Radiography 11/30/2020   FINDINGS: Segmentation:  5 lumbar type vertebral bodies.   Alignment:  No malalignment.   Vertebrae: Benign appearing hemangioma within the T12 vertebral body. Discogenic endplate marrow changes throughout the lumbar region, with some active edema at L2-3, L3-4 and L4-5, which could correlate with localized back pain.   Conus medullaris and cauda equina: Conus extends to the L1-2 level. Conus and cauda equina appear normal.   Paraspinal and other soft tissues: Negative   Disc levels:   T11-12: Mild bulging of the disc.  No compressive stenosis.   T12-L1: Unremarkable interspace.   L1-2: Shallow protrusion of the disc with a tiny amount  caudally migrated disc material to the left of midline. Minimal indentation of the thecal sac. No compressive stenosis.   L2-3: Loss of disc height. Endplate osteophytes and bulging of the disc more prominent towards the left. Mild stenosis of the left lateral recess that could possibly cause left-sided neural compression. Discogenic endplate edema which could relate to back pain.   L3-4: Loss of disc height.  Endplate osteophytes and minimal bulging of the disc. Mild facet hypertrophy on the left. Mild left foraminal stenosis.   L4-5: Loss of disc height. Endplate osteophytes and bulging of the disc. Facet and ligamentous hypertrophy. Mild stenosis of the lateral recesses and foramina but without visible neural compression. Discogenic endplate edema which could relate to back pain   L5-S1: Disc desiccation with mild loss of height. Endplate osteophytes and mild bulging of the disc. No compressive narrowing of the canal or foramina.   IMPRESSION: Degenerative disc disease throughout the lumbar region with loss of disc height. No apparent compressive stenosis of the canal. Discogenic endplate marrow changes without active edema at L2-3, L3-4 and L4-5, which could correlate with regional axial back pain.   Left lateral recess and foraminal narrowing at L2-3, left foraminal narrowing at L3-4, and bilateral lateral recess and foraminal narrowing at L4-5. None of the stenoses are severe or are seen to result in definite neural compression.   Shallow left posterolateral disc herniation at L1-2 with caudal migration of a tiny amount of disc material to the left of midline. This does not appear to cause any significant compressive effect upon the neural structures.     Electronically Signed   By: Oneil Officer M.D.   On: 07/14/2021 23:01  PATIENT SURVEYS:  Modified Oswestry:  MODIFIED OSWESTRY DISABILITY SCALE  Date: 11/27/23 Score  Pain intensity 3 =  Pain medication provides me  with moderate relief from pain.  2. Personal care (washing, dressing, etc.) 1 =  I can take care of myself normally, but it increases my pain.  3. Lifting 3 = Pain prevents me from lifting heavy weights, but I can manage (5) I have hardly any social life because of my pain. light to medium weights if they are conveniently positioned  4. Walking 1 = Pain prevents me from walking more than 1 mile.  5. Sitting 2 =  Pain prevents me from sitting more than 1 hour.  6. Standing 2 =  Pain prevents me from standing more than 1 hour  7. Sleeping 0 = Pain does not prevent me from sleeping well.  8. Social Life 2 = Pain prevents me from participating in more energetic activities (eg. sports, dancing).  9. Traveling 1 =  I can travel anywhere, but it increases my pain.  10. Employment/ Homemaking 1 = My normal homemaking/job activities increase my pain, but I can still perform all that is required of me  Total 16/50= 32%   Interpretation of scores: Score Category Description  0-20% Minimal Disability The patient can cope with most living activities. Usually no treatment is indicated apart from advice on lifting, sitting and exercise  21-40% Moderate Disability The patient experiences more pain and difficulty with sitting, lifting and standing. Travel and social life are more difficult and they may be disabled from work. Personal care, sexual activity and sleeping are not grossly affected, and the patient can usually be managed by conservative means  41-60% Severe Disability Pain remains the main problem in this group, but activities of daily living are affected. These patients require a detailed investigation  61-80% Crippled Back pain impinges on all aspects of the patient's life. Positive intervention is required  81-100% Bed-bound  These patients are either bed-bound or exaggerating their symptoms  Bluford FORBES Zoe DELENA Karon DELENA, et al. Surgery versus conservative management of stable thoracolumbar  fracture: the PRESTO feasibility RCT. [place]): [employer]; 2021 Nov. [facility] [employer] Assessment, No. 25.62.)  Appendix 3, Oswestry Disability Index category descriptors. Available from: FindJewelers.cz  Minimally Clinically Important Difference (MCID) = 12.8%  COGNITION: Overall cognitive status: Within functional limits for tasks assessed     SENSATION: WFL  MUSCLE LENGTH: Hamstrings: Right 80 deg; Left 80 deg Ely's: Positive Bilateral   POSTURE: rounded shoulders and forward head  PALPATION: Right glute med    LUMBAR ROM:   AROM eval  Flexion 100%  Extension 75%*  Right lateral flexion 100%*  Left lateral flexion 100%  Right rotation 100%  Left rotation 100%   (Blank rows = not tested)  LOWER EXTREMITY ROM:     Active  Right eval Left eval  Hip flexion    Hip extension    Hip abduction    Hip adduction    Hip internal rotation    Hip external rotation    Knee flexion    Knee extension    Ankle dorsiflexion    Ankle plantarflexion    Ankle inversion    Ankle eversion     (Blank rows = not tested)  LOWER EXTREMITY MMT:    MMT Right eval Left eval Right  01/07/24 Left  01/07/24    Hip flexion 4 4 4 4   Hip extension (supine)  3 3 4 4   Hip abduction 4 4 4 4   Hip adduction 4 4    Hip internal rotation 4 4    Hip external rotation 4 4    Knee flexion 4 4    Knee extension 4 4    Ankle dorsiflexion 4+ 4+    Ankle plantarflexion      Ankle inversion      Ankle eversion       (Blank rows = not tested)  Sahrmann: Grade  4  LUMBAR SPECIAL TESTS:  Straight leg raise test: Negative, SI Compression/distraction test: Negative, FABER test: Positive Bilateral , and FADIR + RLE   FUNCTIONAL TESTS:  6 minute walk test: 1,400 ft  10 meter walk test: NT   GAIT:  Not assessed    TREATMENT DATE:   01/09/24: Nu-Step with seat at 10 for 3 min  : 1,400 ft   -increased scuffing of foot indicating  decreased foot clearance -Kyphotic posture and right lateral lean indicating dexoscoliosis    -Increased pain in right knee     Palpation: Right Thoracic Paraspinal increased tension  Seated Left Side Lumbar Flexion Silver Ball Roll with hold 60 sec   -Pt reports increased pain in left side of spine    Seated Lumbar Flexion to left with hold for right paraspinal stretch 3 x 60 sec   Standing Thoracic/QL Stretch 2 x 60 sec    Wall Set with Quarter Squat 5 x 30 sec      PATIENT EDUCATION:  Education details: Form and technique for correct performance of exercise and explanation about directional pattern for movement Person educated: Patient Education method: Programmer, multimedia, Demonstration, Verbal cues, and Handouts Education comprehension: verbalized understanding, returned demonstration, and verbal cues required  HOME EXERCISE PROGRAM:  Access Code: [email or web address] URL: [email or web address] Date: 01/09/2024 Prepared by: Toribio Servant  Exercises - Seated Hamstring Stretch with Strap  - 1 x daily - 7 x weekly - 3 reps - 60 sec  hold - Seated Hamstring Stretch  - 1 x daily - 7 x weekly - 4 reps - 60 sec hold - Sidelying ITB Stretch off Table  - 1 x daily - 7 x weekly - 3 reps - 60 sec hold -  Supine Lower Trunk Rotation  - 1 x daily - 7 x weekly - 2 sets - 10 reps - 3 sec hold - Supine Shoulder External and Internal Rotation in Abduction with Dumbbell  - 1 x daily - 7 x weekly - 2 sets - 10 reps - 3-5 sec  hold - Standing Right Body Stretch- Stretch to Left    - 1 x daily - 7 x weekly - 3 reps - 60 sec  hold - Cat Cow  - 1 x daily - 7 x weekly - 2 sets - 10 reps - 3 sec  hold - Prone Press Up On Elbows  - 1 x daily - 7 x weekly - 2 sets - 10 reps - 5 sec   hold - Shoulder PNF D2 Flexion  - 3-4 x weekly - 3 sets - 10 reps - Standing Hip Abduction with Counter Support  - 3-4 x weekly - 3 sets - 10 reps - Hip Extension with Resistance Loop  - 3-4 x weekly - 3 sets - 10 reps - Active  Straight Leg Raise with Quad Set  - 3-4 x weekly - 3 sets - 10 reps - Wall Quarter Squat  - 1 x daily - 7 x weekly - 5 reps - 30 sec hold  ASSESSMENT:  CLINICAL IMPRESSION: Pt shows no progress towards aerobic endurance with remaining the same from baseline. However, pt did show an improvement with decrease in low back pain while performing with limitation being right knee pain. Pt also exhibits increased lateral lean to right, kyphotic posture, and decreased foot clearance. His right sided lateral lean is the result of what appears to be dextro scoliosis albeit this has not been confirmed through imaging or an actually diagnosis. Modified home exercise plan to eliminate exercises causing increased low back pain like lower abdominal crunches in place of pelvic neutral postures like a wall sit. He was able to perform exercise without an increase in his pain. 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    OBJECTIVE IMPAIRMENTS: decreased knowledge of condition, difficulty walking, decreased ROM, decreased strength, hypomobility, impaired flexibility, postural dysfunction, and pain.   ACTIVITY LIMITATIONS: carrying, lifting, bending, sitting, standing, squatting, stairs, bathing, hygiene/grooming, and locomotion level  PARTICIPATION LIMITATIONS: cleaning, laundry, shopping, community activity, and yard work  PERSONAL FACTORS: Age, Time since onset of injury/illness/exacerbation, and 3+ comorbidities: Parkinson's disease, Hypothyroidism, h/o lumbar spondylosis   are also affecting patient's functional outcome.   REHAB POTENTIAL: Fair chronicity of condition and repeated past bouts of physical therapy    CLINICAL DECISION MAKING: Stable/uncomplicated  EVALUATION  COMPLEXITY: Low   GOALS: Goals reviewed with patient? No  SHORT TERM GOALS: Target date: 12/11/2023  Patient will demonstrate undestanding of home exercise plan by performing exercises correctly with evidence of good carry over with min to no verbal or tactile cues .   Baseline: NT  12/10/23: Performing independently  Goal status: ACHIEVED      LONG TERM GOALS: Target date: 02/19/2024  Patient will improve modified Oswestry Disability Index (MODI) score by >=13% as evidence of the minimal statistically significant change for improvement with low back pain disability and improvement in low back function (Copay et al, 2008) Baseline: 16/50 (32%) 01/07/24: 15/50 (30%)  Goal status: ONGOING  2.  Patient will improve hip strength by 1/3 grade MMT (I.E 4- to 4+) and abdominal strength by >= 1 grade for improved lumbar stability and to assist low back in absorbing mechanical forces in weight bearing positions for improved symptom response. Baseline: Hip Flex  R/L 4/4 , Hip Ext R/L 3/3, Hip Abd  R/L 4/4, Saharmann Level 3   01/07/24: Hip Flex R/L 4/4, Hip Ext (Supine) R/L 4-/4-, Hip Abd R/L 4/4, Sahrmann level 4   Goal status: ONGOING   3. Patient will be able to ambulate for 6 minutes without needing a standing or seated rest break due to pain and he will improve his distance by >=82 meters (269 ft) as evidence of an improved minimal detectable change or progress in aerobic endurance and lumbar function (Steffy and Seney 2008).  Baseline: 1,400 ft 01/09/24: 1,400 ft   Goal status: ONGOING     PLAN:  PT FREQUENCY: 1-2x/week  PT DURATION: 12 weeks  PLANNED INTERVENTIONS: 97164- PT Re-evaluation, 97750- Physical Performance Testing, 97110-Therapeutic exercises, 97530- Therapeutic activity, 97112- Neuromuscular re-education, 97535- Self Care, 02859- Manual therapy, [phone]- Gait training, [phone]- Aquatic Therapy, [phone]- Electrical stimulation (unattended), [phone]- Electrical stimulation (manual),  [id number]- Traction (mechanical), 20560 (1-2 muscles), 20561 (3+ muscles)- Dry Needling, Patient/Family education, Balance training, Stair training, Taping, Joint mobilization, Joint manipulation, Spinal manipulation, Spinal mobilization, Vestibular training, DME instructions, Cryotherapy, and Moist heat.  PLAN FOR NEXT SESSION:    Warm up using poles and overground walking and gauging intensity using RPE. Educate on RPE scale. Focus on improving foot clearance: Standing Marches and heel and toe raises.    Walking with poles and metronome for cadence. Progress hip abduction strengthening exercise. Add modified dead lift to home exercise plan.   Toribio Servant PT, DPT  [facility] [address] [place], [address] Phone: [phone]   Fax:  [phone]

## 2024-01-11 DIAGNOSIS — E538 Deficiency of other specified B group vitamins: Secondary | ICD-10-CM | POA: Diagnosis not present

## 2024-01-14 ENCOUNTER — Ambulatory Visit: Admitting: Physical Therapy

## 2024-01-14 DIAGNOSIS — R262 Difficulty in walking, not elsewhere classified: Secondary | ICD-10-CM

## 2024-01-14 DIAGNOSIS — R293 Abnormal posture: Secondary | ICD-10-CM | POA: Diagnosis not present

## 2024-01-14 DIAGNOSIS — M5459 Other low back pain: Secondary | ICD-10-CM

## 2024-01-14 NOTE — Therapy (Signed)
 OUTPATIENT PHYSICAL THERAPY THORACOLUMBAR TREATMENT   Patient Name: Daniel [name] MRN: [id number] DOB:February 14, 1951, 73 y.o., male Today's Date: 01/14/2024  END OF SESSION:  PT End of Session - 01/14/24 0908     Visit Number 12    Number of Visits 24    Date for PT Re-Evaluation 02/19/24    Authorization Type BC[employer] [id number]    Authorization - Visit Number 12    Authorization - Number of Visits 24    Progress Note Due on Visit 20    PT Start Time 0900    PT Stop Time 0945    PT Time Calculation (min) 45 min    Activity Tolerance Patient tolerated treatment well    Behavior During Therapy [facility] for tasks assessed/performed           Past Medical History:  Diagnosis Date   Actinic keratosis    Allergy FROM CHILDHOOD   Arthritis    Asthma    COVID-19 01/03/2023   Eczema    GERD (gastroesophageal reflux disease)    Hx of dysplastic nevus 06/06/2006   Mid lower back, slight atypia   Hypothyroidism    Seasonal allergies    Thyroid  disease    Past Surgical History:  Procedure Laterality Date   CATARACT EXTRACTION     COLON SURGERY     COLONOSCOPY WITH PROPOFOL  N/A 11/18/2014   Procedure: COLONOSCOPY WITH PROPOFOL ;  Surgeon: Lamar ONEIDA Holmes, MD;  Location: [facility] ENDOSCOPY;  Service: Endoscopy;  Laterality: N/A;   COLONOSCOPY WITH PROPOFOL  N/A 12/13/2021   Procedure: COLONOSCOPY WITH PROPOFOL ;  Surgeon: Maryruth Ole ONEIDA, MD;  Location: [facility] ENDOSCOPY;  Service: Endoscopy;  Laterality: N/A;   EYE SURGERY     KNEE SURGERY     NASAL SEPTUM SURGERY     Patient Active Problem List   Diagnosis Date Noted   Parkinson's disease (HCC) 09/26/2022   Chronic idiopathic constipation 06/05/2022   Paroxysmal atrial fibrillation (HCC) 03/28/2022   Spondylolysis, lumbar region 03/28/2022   Avitaminosis D 03/28/2022   Myalgia due to statin 03/28/2022   Acquired thrombophilia (HCC) 03/28/2022   Hyperlipidemia 03/28/2022   Abnormal gait 03/28/2022   Mild intermittent asthma without  complication 03/28/2022   Zenker's (hypopharyngeal) diverticulum 03/22/2018   Gastroesophageal reflux disease 03/22/2018   Fatty liver 10/09/2017   BPH (benign prostatic hyperplasia) 11/21/2014   Hypothyroidism 11/21/2014   Inguinal hernia 11/21/2014   OA (osteoarthritis) 11/21/2014   H/O adenomatous polyp of colon 10/14/2014    PCP: Dr. Jon Eva    REFERRING PROVIDER: Dr. Jannett Fairly   REFERRING DIAG: [phone] (ICD-10-CM) - DDD (degenerative disc disease), lumbar  Rationale for Evaluation and Treatment: Rehabilitation  THERAPY DIAG:  Other low back pain  Difficulty in walking, not elsewhere classified  ONSET DATE: 3+ years ago (around time of COVID)    SUBJECTIVE:  SUBJECTIVE STATEMENT: Pt reports increased soreness and stiffness at the beginning of session. He notes ongoing pain in lateral compartment of right knee but pain in in thoracic pain has diminished.     PERTINENT HISTORY:  Pt reports that he started to experience low back pain a couple of years ago when he was walking during the time of COVID. Pain starts around the lower right hip and then spreads to his shoulder blades as the day goes on or the longer he is on his feet. He usually wears a back brace, which helps some. He is walking two blocks every day to build back to walking two miles per day. Patient was walking two miles a day for 5 out of 7 days per week and he was doing this up until this time of COVID. He feels that his balance is ok and that he has not had any falls in the past 6 months and he carries walking stick with him. He also has increased right knee pain that is due to OA and that he has received steroid joint injections which have helped in the past.   PAIN:  Are you having pain? Yes: NPRS scale: 3/10  (currently when sitting), 10/10 with standing or walking long periods of time   Pain location: right greater trochanter  Pain description: Achy  Aggravating factors: Standing or walking for long periods of time   Relieving factors: Prednisone  for   PRECAUTIONS: None  RED FLAGS: None   WEIGHT BEARING RESTRICTIONS: No  FALLS:  Has patient fallen in last 6 months? No  LIVING ENVIRONMENT: Lives with: lives alone Lives in: House/apartment Stairs: Yes: External: 5 steps; on right going up, on left going up, and can reach both Has following equipment at home: Single point cane and wears life alert monitor    OCCUPATION: Retired, likes to Agricultural consultant at [employer]    PLOF: Independent  PATIENT GOALS: Control pain so that he can return to walking longer distances and standing for longer periods of time to cook again.   NEXT MD VISIT: 9 months; March 2026    OBJECTIVE:  Note: Objective measures were completed at Evaluation unless otherwise noted.  VITALS: BP 116/74 HR 66 SpO2 100%  DIAGNOSTIC FINDINGS:  CLINICAL DATA:  Low back pain, worse with standing. Symptoms over the last year.   EXAM: MRI LUMBAR SPINE WITHOUT CONTRAST   TECHNIQUE: Multiplanar, multisequence MR imaging of the lumbar spine was performed. No intravenous contrast was administered.   COMPARISON:  Radiography 11/30/2020   FINDINGS: Segmentation:  5 lumbar type vertebral bodies.   Alignment:  No malalignment.   Vertebrae: Benign appearing hemangioma within the T12 vertebral body. Discogenic endplate marrow changes throughout the lumbar region, with some active edema at L2-3, L3-4 and L4-5, which could correlate with localized back pain.   Conus medullaris and cauda equina: Conus extends to the L1-2 level. Conus and cauda equina appear normal.   Paraspinal and other soft tissues: Negative   Disc levels:   T11-12: Mild bulging of the disc.  No compressive stenosis.   T12-L1: Unremarkable  interspace.   L1-2: Shallow protrusion of the disc with a tiny amount caudally migrated disc material to the left of midline. Minimal indentation of the thecal sac. No compressive stenosis.   L2-3: Loss of disc height. Endplate osteophytes and bulging of the disc more prominent towards the left. Mild stenosis of the left lateral recess that could possibly cause left-sided neural compression. Discogenic endplate edema  which could relate to back pain.   L3-4: Loss of disc height. Endplate osteophytes and minimal bulging of the disc. Mild facet hypertrophy on the left. Mild left foraminal stenosis.   L4-5: Loss of disc height. Endplate osteophytes and bulging of the disc. Facet and ligamentous hypertrophy. Mild stenosis of the lateral recesses and foramina but without visible neural compression. Discogenic endplate edema which could relate to back pain   L5-S1: Disc desiccation with mild loss of height. Endplate osteophytes and mild bulging of the disc. No compressive narrowing of the canal or foramina.   IMPRESSION: Degenerative disc disease throughout the lumbar region with loss of disc height. No apparent compressive stenosis of the canal. Discogenic endplate marrow changes without active edema at L2-3, L3-4 and L4-5, which could correlate with regional axial back pain.   Left lateral recess and foraminal narrowing at L2-3, left foraminal narrowing at L3-4, and bilateral lateral recess and foraminal narrowing at L4-5. None of the stenoses are severe or are seen to result in definite neural compression.   Shallow left posterolateral disc herniation at L1-2 with caudal migration of a tiny amount of disc material to the left of midline. This does not appear to cause any significant compressive effect upon the neural structures.     Electronically Signed   By: Oneil Officer M.D.   On: 07/14/2021 23:01  PATIENT SURVEYS:  Modified Oswestry:  MODIFIED OSWESTRY DISABILITY  SCALE  Date: 11/27/23 Score  Pain intensity 3 =  Pain medication provides me with moderate relief from pain.  2. Personal care (washing, dressing, etc.) 1 =  I can take care of myself normally, but it increases my pain.  3. Lifting 3 = Pain prevents me from lifting heavy weights, but I can manage (5) I have hardly any social life because of my pain. light to medium weights if they are conveniently positioned  4. Walking 1 = Pain prevents me from walking more than 1 mile.  5. Sitting 2 =  Pain prevents me from sitting more than 1 hour.  6. Standing 2 =  Pain prevents me from standing more than 1 hour  7. Sleeping 0 = Pain does not prevent me from sleeping well.  8. Social Life 2 = Pain prevents me from participating in more energetic activities (eg. sports, dancing).  9. Traveling 1 =  I can travel anywhere, but it increases my pain.  10. Employment/ Homemaking 1 = My normal homemaking/job activities increase my pain, but I can still perform all that is required of me  Total 16/50= 32%   Interpretation of scores: Score Category Description  0-20% Minimal Disability The patient can cope with most living activities. Usually no treatment is indicated apart from advice on lifting, sitting and exercise  21-40% Moderate Disability The patient experiences more pain and difficulty with sitting, lifting and standing. Travel and social life are more difficult and they may be disabled from work. Personal care, sexual activity and sleeping are not grossly affected, and the patient can usually be managed by conservative means  41-60% Severe Disability Pain remains the main problem in this group, but activities of daily living are affected. These patients require a detailed investigation  61-80% Crippled Back pain impinges on all aspects of the patient's life. Positive intervention is required  81-100% Bed-bound  These patients are either bed-bound or exaggerating their symptoms  Bluford BRAVO, [place]le[place] DELENA Karon DELENA,  et al. Surgery versus conservative management of stable thoracolumbar fracture: the PRESTO feasibility  RCT. Ruben ([place]): NIHR Journals Library; 2021 Nov. [facility] Technology Assessment, No. 25.62.) Appendix 3, Oswestry Disability Index category descriptors. Available from: FindJewelers.cz  Minimally Clinically Important Difference (MCID) = 12.8%  COGNITION: Overall cognitive status: Within functional limits for tasks assessed     SENSATION: WFL  MUSCLE LENGTH: Hamstrings: Right 80 deg; Left 80 deg Ely's: Positive Bilateral   POSTURE: rounded shoulders and forward head  PALPATION: Right glute med    LUMBAR ROM:   AROM eval  Flexion 100%  Extension 75%*  Right lateral flexion 100%*  Left lateral flexion 100%  Right rotation 100%  Left rotation 100%   (Blank rows = not tested)  LOWER EXTREMITY ROM:     Active  Right eval Left eval  Hip flexion    Hip extension    Hip abduction    Hip adduction    Hip internal rotation    Hip external rotation    Knee flexion    Knee extension    Ankle dorsiflexion    Ankle plantarflexion    Ankle inversion    Ankle eversion     (Blank rows = not tested)  LOWER EXTREMITY MMT:    MMT Right eval Left eval Right  01/07/24 Left  01/07/24    Hip flexion 4 4 4 4   Hip extension (supine)  3 3 4 4   Hip abduction 4 4 4 4   Hip adduction 4 4    Hip internal rotation 4 4    Hip external rotation 4 4    Knee flexion 4 4    Knee extension 4 4    Ankle dorsiflexion 4+ 4+    Ankle plantarflexion      Ankle inversion      Ankle eversion       (Blank rows = not tested)  Sahrmann: Grade  4  LUMBAR SPECIAL TESTS:  Straight leg raise test: Negative, SI Compression/distraction test: Negative, FABER test: Positive Bilateral , and FADIR + RLE   FUNCTIONAL TESTS:  6 minute walk test: 1,400 ft  10 meter walk test: NT   GAIT:  Not assessed    TREATMENT DATE:   01/14/24: THEREX  Overground walking  with trekking poles and step through gait  5 x 100 ft    Matrix Hip 4 way- 3  x 10 Hip Ext with BUE support #55 lbs   Calf Raises on 8 inch step with BUE support 3 x 10   Matrix Hip 4 way- 3 x 10 Hip Abd with BUE support #55 2 x 10  -Pt reports increased pain in lateral compartment of right knee    Matrix Hip 4 way- 3 x 10 Hip Abd with BUE support #45 3 x 10  -Pt reports increased pain in lateral compartment of right knee    Seated Knee Flex/Ext AROM with use of soccer ball on RLE 3 x 10   Seated Shoulder T's AROM 3 x 10  -Pt reports dull ache in the center of his thoracic spine.    PATIENT EDUCATION:  Education details: Form and technique for correct performance of exercise and explanation about directional pattern for movement Person educated: Patient Education method: Explanation, Demonstration, Verbal cues, and Handouts Education comprehension: verbalized understanding, returned demonstration, and verbal cues required  HOME EXERCISE PROGRAM:  Access Code: [email or web address] URL: [email or web address] Date: 01/14/2024 Prepared by: Toribio Servant  Exercises - Seated Hamstring Stretch with Strap  - 1 x daily - 7 x weekly - 3 reps - 60 sec  hold - Seated Hamstring Stretch  - 1 x daily - 7 x weekly - 4 reps - 60 sec hold - Seated Knee Flexion Extension AROM   - 1 x daily - 7 x weekly - 3 sets - 20 reps - Sidelying ITB Stretch off Table  - 1 x daily - 7 x weekly - 3 reps - 60 sec hold - Supine Lower Trunk Rotation  - 1 x daily - 7 x weekly - 2 sets - 10 reps - 3 sec hold - Supine Shoulder External and Internal Rotation in Abduction with Dumbbell  - 1 x daily - 7 x weekly - 2 sets - 10 reps - 3-5 sec  hold - Standing Right Body Stretch- Stretch to Left    - 1 x daily - 7 x weekly - 3 reps - 60 sec  hold - Cat Cow  - 1 x daily - 7 x weekly - 2 sets - 10 reps - 3 sec  hold - Prone Press Up On Elbows  - 1 x daily - 7 x weekly - 2 sets - 10 reps - 5 sec   hold - Seated Arm Crossing  -  3-4 x weekly - 3 sets - 10 reps - Shoulder PNF D2 Flexion  - 3-4 x weekly - 3 sets - 10 reps - Standing Hip Abduction with Counter Support  - 3-4 x weekly - 3 sets - 10 reps - Hip Extension with Resistance Loop  - 3-4 x weekly - 3 sets - 10 reps - Wall Quarter Squat  - 1 x daily - 7 x weekly - 5 reps - 30 sec hold  ASSESSMENT:  CLINICAL IMPRESSION: Pt exhibits ongoing progress with decreased pain when performing exercises and improvement in LE strength with ability to perform exercises with increased resistance. Modified exercises to decrease right lateral knee pain which has been an ongoing limitation to mobility.  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    OBJECTIVE IMPAIRMENTS: decreased knowledge of condition, difficulty walking, decreased ROM, decreased strength, hypomobility, impaired flexibility, postural dysfunction, and pain.   ACTIVITY LIMITATIONS: carrying, lifting, bending, sitting, standing, squatting, stairs, bathing, hygiene/grooming, and locomotion level  PARTICIPATION LIMITATIONS: cleaning, laundry, shopping, community activity, and yard work  PERSONAL FACTORS: Age, Time since onset of injury/illness/exacerbation, and 3+ comorbidities: Parkinson's disease, Hypothyroidism, h/o lumbar spondylosis   are also affecting patient's functional outcome.   REHAB POTENTIAL: Fair chronicity of condition and repeated past bouts of physical therapy    CLINICAL DECISION MAKING: Stable/uncomplicated  EVALUATION COMPLEXITY: Low   GOALS: Goals reviewed with patient? No  SHORT TERM GOALS: Target date: 12/11/2023  Patient will demonstrate undestanding of home exercise plan by performing exercises correctly with evidence of good carry over with min to no  verbal or tactile cues .   Baseline: NT  12/10/23: Performing independently  Goal status: ACHIEVED      LONG TERM GOALS: Target date: 02/19/2024  Patient will improve modified Oswestry Disability Index (MODI) score by >=13% as evidence of the minimal statistically significant change for improvement with low back pain disability and improvement in low back function (Copay et al, 2008) Baseline: 16/50 (32%) 01/07/24: 15/50 (30%)  Goal status: ONGOING   2.  Patient will improve hip strength by 1/3  grade MMT (I.E 4- to 4+) and abdominal strength by >= 1 grade for improved lumbar stability and to assist low back in absorbing mechanical forces in weight bearing positions for improved symptom response. Baseline: Hip Flex  R/L 4/4 , Hip Ext R/L 3/3, Hip Abd  R/L 4/4, Saharmann Level 3   01/07/24: Hip Flex R/L 4/4, Hip Ext (Supine) R/L 4-/4-, Hip Abd R/L 4/4, Sahrmann level 4   Goal status: ONGOING   3. Patient will be able to ambulate for 6 minutes without needing a standing or seated rest break due to pain and he will improve his distance by >=82 meters (269 ft) as evidence of an improved minimal detectable change or progress in aerobic endurance and lumbar function (Steffy and Seney 2008).  Baseline: 1,400 ft 01/09/24: 1,400 ft   Goal status: ONGOING     PLAN:  PT FREQUENCY: 1-2x/week  PT DURATION: 12 weeks  PLANNED INTERVENTIONS: 97164- PT Re-evaluation, 97750- Physical Performance Testing, 97110-Therapeutic exercises, 97530- Therapeutic activity, 97112- Neuromuscular re-education, 97535- Self Care, 02859- Manual therapy, [phone]- Gait training, [phone]- Aquatic Therapy, [phone]- Electrical stimulation (unattended), [phone]- Electrical stimulation (manual), [id number]- Traction (mechanical), 20560 (1-2 muscles), 20561 (3+ muscles)- Dry Needling, Patient/Family education, Balance training, Stair training, Taping, Joint mobilization, Joint manipulation, Spinal manipulation, Spinal mobilization, Vestibular  training, DME instructions, Cryotherapy, and Moist heat.  PLAN FOR NEXT SESSION:    Warm up using poles and overground walking and gauging intensity using RPE. Educate on RPE scale. Focus on improving foot clearance: Standing Marches and heel and toe raises.   Walking with poles and metronome for cadence. Progress hip abduction strengthening exercise. Add modified dead lift and hip flexion exercises.   Toribio Servant PT, DPT  [facility] [address] [place], [address] Phone: [phone]   Fax:  [phone]

## 2024-01-16 ENCOUNTER — Ambulatory Visit: Admitting: Physical Therapy

## 2024-01-16 DIAGNOSIS — M5459 Other low back pain: Secondary | ICD-10-CM | POA: Diagnosis not present

## 2024-01-16 DIAGNOSIS — R262 Difficulty in walking, not elsewhere classified: Secondary | ICD-10-CM | POA: Diagnosis not present

## 2024-01-16 DIAGNOSIS — R293 Abnormal posture: Secondary | ICD-10-CM | POA: Diagnosis not present

## 2024-01-16 NOTE — Therapy (Signed)
 OUTPATIENT PHYSICAL THERAPY THORACOLUMBAR TREATMENT   Patient Name: LENNY FIUMARA MRN: [id number] DOB:1951/04/01, 73 y.o., male Today's Date: 01/16/2024  END OF SESSION:  PT End of Session - 01/16/24 0914     Visit Number 13    Number of Visits 24    Date for PT Re-Evaluation 02/19/24    Authorization Type [id number]    Authorization - Visit Number 13    Authorization - Number of Visits 24    Progress Note Due on Visit 20    PT Start Time 0905    PT Stop Time 0945    PT Time Calculation (min) 40 min    Activity Tolerance Patient tolerated treatment well    Behavior During Therapy [facility] for tasks assessed/performed           Past Medical History:  Diagnosis Date   Actinic keratosis    Allergy FROM CHILDHOOD   Arthritis    Asthma    COVID-19 01/03/2023   Eczema    GERD (gastroesophageal reflux disease)    Hx of dysplastic nevus 06/06/2006   Mid lower back, slight atypia   Hypothyroidism    Seasonal allergies    Thyroid  disease    Past Surgical History:  Procedure Laterality Date   CATARACT EXTRACTION     COLON SURGERY     COLONOSCOPY WITH PROPOFOL  N/A 11/18/2014   Procedure: COLONOSCOPY WITH PROPOFOL ;  Surgeon: Lamar ONEIDA Holmes, MD;  Location: [facility] ENDOSCOPY;  Service: Endoscopy;  Laterality: N/A;   COLONOSCOPY WITH PROPOFOL  N/A 12/13/2021   Procedure: COLONOSCOPY WITH PROPOFOL ;  Surgeon: Maryruth Ole ONEIDA, MD;  Location: [facility] ENDOSCOPY;  Service: Endoscopy;  Laterality: N/A;   EYE SURGERY     KNEE SURGERY     NASAL SEPTUM SURGERY     Patient Active Problem List   Diagnosis Date Noted   Parkinson's disease (HCC) 09/26/2022   Chronic idiopathic constipation 06/05/2022   Paroxysmal atrial fibrillation (HCC) 03/28/2022   Spondylolysis, lumbar region 03/28/2022   Avitaminosis D 03/28/2022   Myalgia due to statin 03/28/2022   Acquired thrombophilia (HCC) 03/28/2022   Hyperlipidemia 03/28/2022   Abnormal gait 03/28/2022   Mild intermittent asthma without  complication 03/28/2022   Zenker's (hypopharyngeal) diverticulum 03/22/2018   Gastroesophageal reflux disease 03/22/2018   Fatty liver 10/09/2017   BPH (benign prostatic hyperplasia) 11/21/2014   Hypothyroidism 11/21/2014   Inguinal hernia 11/21/2014   OA (osteoarthritis) 11/21/2014   H/O adenomatous polyp of colon 10/14/2014    PCP: Dr. Jon Eva    REFERRING PROVIDER: Dr. Jannett Fairly   REFERRING DIAG: [phone] (ICD-10-CM) - DDD (degenerative disc disease), lumbar  Rationale for Evaluation and Treatment: Rehabilitation  THERAPY DIAG:  Other low back pain  Difficulty in walking, not elsewhere classified  Abnormal posture  ONSET DATE: 3+ years ago (around time of COVID)    SUBJECTIVE:  SUBJECTIVE STATEMENT: Pt states that he is feeling stiff today but not much pain. He continues to walk with poles and he is now at 4 blocks.     PERTINENT HISTORY:  Pt reports that he started to experience low back pain a couple of years ago when he was walking during the time of COVID. Pain starts around the lower right hip and then spreads to his shoulder blades as the day goes on or the longer he is on his feet. He usually wears a back brace, which helps some. He is walking two blocks every day to build back to walking two miles per day. Patient was walking two miles a day for 5 out of 7 days per week and he was doing this up until this time of COVID. He feels that his balance is ok and that he has not had any falls in the past 6 months and he carries walking stick with him. He also has increased right knee pain that is due to OA and that he has received steroid joint injections which have helped in the past.   PAIN:  Are you having pain? Yes: NPRS scale: 3/10 (currently when sitting), 10/10 with standing  or walking long periods of time   Pain location: right greater trochanter  Pain description: Achy  Aggravating factors: Standing or walking for long periods of time   Relieving factors: Prednisone  for   PRECAUTIONS: None  RED FLAGS: None   WEIGHT BEARING RESTRICTIONS: No  FALLS:  Has patient fallen in last 6 months? No  LIVING ENVIRONMENT: Lives with: lives alone Lives in: House/apartment Stairs: Yes: External: 5 steps; on right going up, on left going up, and can reach both Has following equipment at home: Single point cane and wears life alert monitor    OCCUPATION: Retired, likes to Agricultural consultant at [employer]    PLOF: Independent  PATIENT GOALS: Control pain so that he can return to walking longer distances and standing for longer periods of time to cook again.   NEXT MD VISIT: 9 months; March 2026    OBJECTIVE:  Note: Objective measures were completed at Evaluation unless otherwise noted.  VITALS: BP 116/74 HR 66 SpO2 100%  DIAGNOSTIC FINDINGS:  CLINICAL DATA:  Low back pain, worse with standing. Symptoms over the last year.   EXAM: MRI LUMBAR SPINE WITHOUT CONTRAST   TECHNIQUE: Multiplanar, multisequence MR imaging of the lumbar spine was performed. No intravenous contrast was administered.   COMPARISON:  Radiography 11/30/2020   FINDINGS: Segmentation:  5 lumbar type vertebral bodies.   Alignment:  No malalignment.   Vertebrae: Benign appearing hemangioma within the T12 vertebral body. Discogenic endplate marrow changes throughout the lumbar region, with some active edema at L2-3, L3-4 and L4-5, which could correlate with localized back pain.   Conus medullaris and cauda equina: Conus extends to the L1-2 level. Conus and cauda equina appear normal.   Paraspinal and other soft tissues: Negative   Disc levels:   T11-12: Mild bulging of the disc.  No compressive stenosis.   T12-L1: Unremarkable interspace.   L1-2: Shallow protrusion of  the disc with a tiny amount caudally migrated disc material to the left of midline. Minimal indentation of the thecal sac. No compressive stenosis.   L2-3: Loss of disc height. Endplate osteophytes and bulging of the disc more prominent towards the left. Mild stenosis of the left lateral recess that could possibly cause left-sided neural compression. Discogenic endplate edema which could relate to  back pain.   L3-4: Loss of disc height. Endplate osteophytes and minimal bulging of the disc. Mild facet hypertrophy on the left. Mild left foraminal stenosis.   L4-5: Loss of disc height. Endplate osteophytes and bulging of the disc. Facet and ligamentous hypertrophy. Mild stenosis of the lateral recesses and foramina but without visible neural compression. Discogenic endplate edema which could relate to back pain   L5-S1: Disc desiccation with mild loss of height. Endplate osteophytes and mild bulging of the disc. No compressive narrowing of the canal or foramina.   IMPRESSION: Degenerative disc disease throughout the lumbar region with loss of disc height. No apparent compressive stenosis of the canal. Discogenic endplate marrow changes without active edema at L2-3, L3-4 and L4-5, which could correlate with regional axial back pain.   Left lateral recess and foraminal narrowing at L2-3, left foraminal narrowing at L3-4, and bilateral lateral recess and foraminal narrowing at L4-5. None of the stenoses are severe or are seen to result in definite neural compression.   Shallow left posterolateral disc herniation at L1-2 with caudal migration of a tiny amount of disc material to the left of midline. This does not appear to cause any significant compressive effect upon the neural structures.     Electronically Signed   By: Oneil Officer M.D.   On: 07/14/2021 23:01  PATIENT SURVEYS:  Modified Oswestry:  MODIFIED OSWESTRY DISABILITY SCALE  Date: 11/27/23 Score  Pain intensity 3 =   Pain medication provides me with moderate relief from pain.  2. Personal care (washing, dressing, etc.) 1 =  I can take care of myself normally, but it increases my pain.  3. Lifting 3 = Pain prevents me from lifting heavy weights, but I can manage (5) I have hardly any social life because of my pain. light to medium weights if they are conveniently positioned  4. Walking 1 = Pain prevents me from walking more than 1 mile.  5. Sitting 2 =  Pain prevents me from sitting more than 1 hour.  6. Standing 2 =  Pain prevents me from standing more than 1 hour  7. Sleeping 0 = Pain does not prevent me from sleeping well.  8. Social Life 2 = Pain prevents me from participating in more energetic activities (eg. sports, dancing).  9. Traveling 1 =  I can travel anywhere, but it increases my pain.  10. Employment/ Homemaking 1 = My normal homemaking/job activities increase my pain, but I can still perform all that is required of me  Total 16/50= 32%   Interpretation of scores: Score Category Description  0-20% Minimal Disability The patient can cope with most living activities. Usually no treatment is indicated apart from advice on lifting, sitting and exercise  21-40% Moderate Disability The patient experiences more pain and difficulty with sitting, lifting and standing. Travel and social life are more difficult and they may be disabled from work. Personal care, sexual activity and sleeping are not grossly affected, and the patient can usually be managed by conservative means  41-60% Severe Disability Pain remains the main problem in this group, but activities of daily living are affected. These patients require a detailed investigation  61-80% Crippled Back pain impinges on all aspects of the patient's life. Positive intervention is required  81-100% Bed-bound  These patients are either bed-bound or exaggerating their symptoms  Bluford FORBES Zoe DELENA Karon DELENA, et al. Surgery versus conservative management of  stable thoracolumbar fracture: the PRESTO feasibility RCT. [place] ([place]): NIHR  Journals Library; 2021 Nov. (Health Technology Assessment, No. 25.62.) Appendix 3, Oswestry Disability Index category descriptors. Available from: FindJewelers.cz  Minimally Clinically Important Difference (MCID) = 12.8%  COGNITION: Overall cognitive status: Within functional limits for tasks assessed     SENSATION: WFL  MUSCLE LENGTH: Hamstrings: Right 80 deg; Left 80 deg Ely's: Positive Bilateral   POSTURE: rounded shoulders and forward head  PALPATION: Right glute med    LUMBAR ROM:   AROM eval  Flexion 100%  Extension 75%*  Right lateral flexion 100%*  Left lateral flexion 100%  Right rotation 100%  Left rotation 100%   (Blank rows = not tested)  LOWER EXTREMITY ROM:     Active  Right eval Left eval  Hip flexion    Hip extension    Hip abduction    Hip adduction    Hip internal rotation    Hip external rotation    Knee flexion    Knee extension    Ankle dorsiflexion    Ankle plantarflexion    Ankle inversion    Ankle eversion     (Blank rows = not tested)  LOWER EXTREMITY MMT:    MMT Right eval Left eval Right  01/07/24 Left  01/07/24    Hip flexion 4 4 4 4   Hip extension (supine)  3 3 4 4   Hip abduction 4 4 4 4   Hip adduction 4 4    Hip internal rotation 4 4    Hip external rotation 4 4    Knee flexion 4 4    Knee extension 4 4    Ankle dorsiflexion 4+ 4+    Ankle plantarflexion      Ankle inversion      Ankle eversion       (Blank rows = not tested)  Sahrmann: Grade  4  LUMBAR SPECIAL TESTS:  Straight leg raise test: Negative, SI Compression/distraction test: Negative, FABER test: Positive Bilateral , and FADIR + RLE   FUNCTIONAL TESTS:  6 minute walk test: 1,400 ft  10 meter walk test: NT   GAIT:  Not assessed    TREATMENT DATE:   01/16/24: THEREX   Nu-Step with seat at 10 for 6 min   Quadruped with left hip extension  with flexed  3 x 10   -Pt reports increased tension on anterior portion of hip  Modified Thomas Stretch on RLE 1 x 60 sec Modified Thomas Stretch on RLE with #10 AW 2 x 60 sec     Left Side Lying Right IT band stretch 2 x 60 sec   THERAC   Overground Ambulation 2 x 100 ft  with use of poles for warm up   Overground Ambulation 1 x 100 ft with emphasis on moderate intensity using RPE scale with talk test  Quarter squat 1 x 10   -min VC to tap buttocks against mat    Quarter squat with yellow band around knees for ER activation 1 x 10 Quarter squat with red band around knees for ER activation 1 x 10   -Pt reports increased pain in lateral side of his right knee     SELF CARE HOME MANAGEMENT   Education on use of rate of perceived exertion and talk test to aim for moderate intensity exercise to properly gauge exercise intensity. Activity pacing for upcoming trip to avoid overdoing it: take frequent breaks  PATIENT EDUCATION:  Education details: Form and technique for correct performance of exercise and explanation about directional pattern for movement  Person educated: Patient Education method: Explanation, Demonstration, Verbal cues, and Handouts Education comprehension: verbalized understanding, returned demonstration, and verbal cues required  HOME EXERCISE PROGRAM:  Access Code: [email or web address] URL: [email or web address] Date: 01/16/2024 Prepared by: Toribio Servant  Exercises - Seated Hamstring Stretch with Strap  - 1 x daily - 7 x weekly - 3 reps - 60 sec  hold - Seated Hamstring Stretch  - 1 x daily - 7 x weekly - 4 reps - 60 sec hold - Seated Knee Flexion Extension AROM   - 1 x daily - 7 x weekly - 3 sets - 20 reps - Supine Dynamic Modified Debby Hawthorne and Hip Flexor Dynamic Stretch  - 1 x daily - 7 x weekly - 3 reps - 60 sec hold - Sidelying ITB Stretch off Table  - 1 x daily - 7 x weekly - 3 reps - 60 sec hold - Supine Lower Trunk Rotation  - 1 x daily - 7 x weekly -  2 sets - 10 reps - 3 sec hold - Supine Shoulder External and Internal Rotation in Abduction with Dumbbell  - 1 x daily - 7 x weekly - 2 sets - 10 reps - 3-5 sec  hold - Standing Right Body Stretch- Stretch to Left    - 1 x daily - 7 x weekly - 3 reps - 60 sec  hold - Cat Cow  - 1 x daily - 7 x weekly - 2 sets - 10 reps - 3 sec  hold - Prone Press Up On Elbows  - 1 x daily - 7 x weekly - 2 sets - 10 reps - 5 sec   hold - Seated Arm Crossing  - 3-4 x weekly - 3 sets - 10 reps - Shoulder PNF D2 Flexion  - 3-4 x weekly - 3 sets - 10 reps - Standing Hip Abduction with Counter Support  - 3-4 x weekly - 3 sets - 10 reps - Wall Quarter Squat  - 1 x daily - 7 x weekly - 5 reps - 30 sec hold - Quadruped Bent Leg Hip Extension  - 3-4 x weekly - 3 sets - 10 reps  ASSESSMENT:  CLINICAL IMPRESSION: Pt demonstrates improvement in LE strength with ability to perform right hip extension in quadruped for the first time. He continues to be limited with weight bearing LE strengthening activities that require knee flexion from severe lateral compartment OA in right knee. 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    OBJECTIVE IMPAIRMENTS: decreased knowledge of condition, difficulty walking, decreased ROM, decreased strength, hypomobility, impaired flexibility, postural dysfunction, and pain.   ACTIVITY LIMITATIONS: carrying, lifting, bending, sitting, standing, squatting, stairs, bathing, hygiene/grooming, and locomotion level  PARTICIPATION LIMITATIONS: cleaning, laundry, shopping, community activity, and yard work  PERSONAL FACTORS: Age, Time since onset of injury/illness/exacerbation, and 3+ comorbidities: Parkinson's disease, Hypothyroidism, h/o lumbar spondylosis   are also  affecting patient's functional outcome.   REHAB POTENTIAL: Fair chronicity of condition and repeated past bouts of physical therapy    CLINICAL DECISION MAKING: Stable/uncomplicated  EVALUATION COMPLEXITY: Low   GOALS: Goals reviewed with patient? No  SHORT TERM GOALS: Target date: 12/11/2023  Patient will demonstrate undestanding of home exercise plan by performing exercises correctly with evidence of good carry over  with min to no verbal or tactile cues .   Baseline: NT  12/10/23: Performing independently  Goal status: ACHIEVED      LONG TERM GOALS: Target date: 02/19/2024  Patient will improve modified Oswestry Disability Index (MODI) score by >=13% as evidence of the minimal statistically significant change for improvement with low back pain disability and improvement in low back function (Copay et al, 2008) Baseline: 16/50 (32%) 01/07/24: 15/50 (30%)  Goal status: ONGOING   2.  Patient will improve hip strength by 1/3 grade MMT (I.E 4- to 4+) and abdominal strength by >= 1 grade for improved lumbar stability and to assist low back in absorbing mechanical forces in weight bearing positions for improved symptom response. Baseline: Hip Flex  R/L 4/4 , Hip Ext R/L 3/3, Hip Abd  R/L 4/4, Saharmann Level 3   01/07/24: Hip Flex R/L 4/4, Hip Ext (Supine) R/L 4-/4-, Hip Abd R/L 4/4, Sahrmann level 4   Goal status: ONGOING   3. Patient will be able to ambulate for 6 minutes without needing a standing or seated rest break due to pain and he will improve his distance by >=82 meters (269 ft) as evidence of an improved minimal detectable change or progress in aerobic endurance and lumbar function (Steffy and Seney 2008).  Baseline: 1,400 ft 01/09/24: 1,400 ft   Goal status: ONGOING     PLAN:  PT FREQUENCY: 1-2x/week  PT DURATION: 12 weeks  PLANNED INTERVENTIONS: 97164- PT Re-evaluation, 97750- Physical Performance Testing, 97110-Therapeutic exercises, 97530- Therapeutic activity, 97112-  Neuromuscular re-education, 97535- Self Care, 02859- Manual therapy, [phone]- Gait training, [phone]- Aquatic Therapy, [phone]- Electrical stimulation (unattended), [phone]- Electrical stimulation (manual), [id number]- Traction (mechanical), 20560 (1-2 muscles), 20561 (3+ muscles)- Dry Needling, Patient/Family education, Balance training, Stair training, Taping, Joint mobilization, Joint manipulation, Spinal manipulation, Spinal mobilization, Vestibular training, DME instructions, Cryotherapy, and Moist heat.  PLAN FOR NEXT SESSION:   Nu-Step with emphasis on RPE and reaching moderate intensity. Focus on improving foot clearance: Standing Marches and heel and toe raises.   Walking with poles and metronome for cadence. Progress hip abduction strengthening exercise. Add modified dead lift and hip flexion exercises.   Toribio Servant PT, DPT  [facility] [address] [place], [address] Phone: [phone]   Fax:  [phone]

## 2024-01-17 ENCOUNTER — Ambulatory Visit (INDEPENDENT_AMBULATORY_CARE_PROVIDER_SITE_OTHER): Admitting: Family Medicine

## 2024-01-17 ENCOUNTER — Encounter: Payer: Self-pay | Admitting: Family Medicine

## 2024-01-17 VITALS — BP 102/64 | HR 57 | Resp 16 | Ht 74.0 in | Wt 202.0 lb

## 2024-01-17 DIAGNOSIS — J069 Acute upper respiratory infection, unspecified: Secondary | ICD-10-CM | POA: Diagnosis not present

## 2024-01-17 DIAGNOSIS — J4520 Mild intermittent asthma, uncomplicated: Secondary | ICD-10-CM | POA: Diagnosis not present

## 2024-01-17 MED ORDER — FLUTICASONE PROPIONATE 50 MCG/ACT NA SUSP
2.0000 | Freq: Every day | NASAL | 6 refills | Status: AC
Start: 2024-01-17 — End: ?

## 2024-01-17 NOTE — Progress Notes (Signed)
 ACUTE VISIT   Patient: Daniel [name]   DOB: 05/11/1951   73 y.o. Male  MRN: [id number]   PCP: Myrla Jon HERO, MD  Chief Complaint  Patient presents with   Acute Visit    Congestion/ pressure in both ears ( since Monday) no fever coughing phglem    Subjective    HPI HPI     Acute Visit    Additional comments: Congestion/ pressure in both ears ( since Monday) no fever coughing phglem       Last edited by Marylen Odella CROME, CMA on 01/17/2024  4:12 PM.       Discussed the use of AI scribe software for clinical note transcription with the patient, who gave verbal consent to proceed.  History of Present Illness Daniel [name] is a 73 year old male with Parkinson's disease who presents with ear pressure and congestion.  He has been experiencing ear pressure and congestion for the past four days, described as a sensation of the eardrums bulging out, though not painful. The pressure was more intense yesterday compared to today. He noticed difficulty in hearing tunes on the radio, which prompted him to seek medical attention due to past experiences of hearing loss.  He has a history of partial hearing loss and currently uses two hearing aids. He is concerned about the potential for further hearing loss, as he previously delayed seeking medical care for similar symptoms, which resulted in partial hearing loss.  He reports coughing up yellow phlegm. No sore throat, headaches, or diarrhea. He experiences significant body aches, which he attributes to his ongoing physical therapy for back and knee issues, noting that these aches preceded his current symptoms.  His appetite remains normal. No shortness of breath, chest tightness, sneezing, or itching. He has a history of using intranasal steroids in the past, which successfully alleviated similar symptoms.  He reports a history of asthma, which does not usually bother him except in certain situations. He does not currently  take medication for asthma.     Medications: Outpatient Medications Prior to Visit  Medication Sig   carbidopa-levodopa (PARCOPA) 25-100 MG disintegrating tablet Take 1 tablet by mouth 3 (three) times daily.   Cholecalciferol (VITAMIN D ) 125 MCG (5000 UT) CAPS Take 5,000 Units by mouth daily.   cyanocobalamin  (VITAMIN B12) 1000 MCG tablet Take 1,000 mcg by mouth daily.   docusate sodium (COLACE) 100 MG capsule Take 200 mg by mouth at bedtime.   ELIQUIS 5 MG TABS tablet Take 5 mg by mouth 2 (two) times daily.   levothyroxine  (SYNTHROID ) 100 MCG tablet TAKE 1 TABLET BY MOUTH EVERY DAY BEFORE BREAKFAST   metoprolol succinate (TOPROL-XL) 25 MG 24 hr tablet Take 25 mg by mouth daily.   ondansetron  (ZOFRAN ) 4 MG tablet TAKE 1 TABLET(4 MG) BY MOUTH EVERY 8 HOURS AS NEEDED FOR NAUSEA OR VOMITING   pantoprazole  (PROTONIX ) 40 MG tablet Take 1 tablet (40 mg total) by mouth daily.   predniSONE  (DELTASONE ) 20 MG tablet Take 1 tablet (20 mg total) by mouth daily with breakfast.   traMADol (ULTRAM) 50 MG tablet Take 75 mg by mouth 2 (two) times daily.   triamcinolone  cream (KENALOG ) 0.1 % Apply 1 Application topically as directed. Qd up to 5 days per week to aa rash back until clear, avoid face, groin, axilla   No facility-administered medications prior to visit.    Last CBC Lab Results  Component Value Date   WBC 7.0  10/02/2023   HGB 15.7 10/02/2023   HCT 46.0 10/02/2023   MCV 86 10/02/2023   MCH 29.2 10/02/2023   RDW 13.0 10/02/2023   PLT 275 10/02/2023   Last metabolic panel Lab Results  Component Value Date   GLUCOSE 96 10/02/2023   NA 142 10/02/2023   K 4.6 10/02/2023   CL 103 10/02/2023   CO2 22 10/02/2023   BUN 15 10/02/2023   CREATININE 1.10 10/02/2023   EGFR 70 10/02/2023   CALCIUM 9.5 10/02/2023   PHOS 3.4 11/16/2021   PROT 6.7 10/02/2023   ALBUMIN 4.4 10/02/2023   LABGLOB 2.3 10/02/2023   AGRATIO 1.9 03/28/2022   BILITOT 0.8 10/02/2023   ALKPHOS 79 10/02/2023   AST  14 10/02/2023   ALT 7 10/02/2023        Objective    BP 102/64 (BP Location: Right Arm, Patient Position: Sitting, Cuff Size: Normal)   Pulse (!) 57   Resp 16   Ht 6' 2 (1.88 m)   Wt 202 lb (91.6 kg)   SpO2 97%   BMI 25.94 kg/m    Physical Exam   Physical Exam HEENT: Left tympanic membrane effusion, no erythema, no bulge. Right tympanic membrane with dry cerumen, no erythema. Ear canals normal, no erythema. Oropharynx without erythema or exudate. No maxillary or frontal sinus tenderness. NECK: No cervical, posterior, or posterior auricular lymphadenopathy. CHEST: Lungs clear to auscultation bilaterally, no wheezes. CARDIOVASCULAR: Heart regular rate and rhythm, S1 and S2 normal without murmurs.   No results found for any visits on 01/17/24.  Assessment & Plan     Assessment and Plan Assessment & Plan Viral URI with Ear Congestion  Left ear effusion with congestion for four days. Symptoms include ear pressure and congestion, with no pain. Cough with yellow phlegm present, but no fever, sore throat, or shortness of breath. Partial hearing loss raises concern for further impairment. Examination reveals left-sided tympanic membrane effusion with air fluid levels, indicating congestion. No erythema, bulging, acute infection, or severe inflammation. Intranasal steroid therapy previously effective in resolving similar symptoms. - Prescribe intranasal steroid (Flonase ) with two sprays in each nostril once daily. - Advise consistent use of Flonase  for optimal effect, which may take a couple of weeks. - Instruct to monitor symptoms and return if symptoms worsen or if fever develops by next Thursday.      No follow-ups on file.        Rockie Agent, MD  [facility] [phone] (phone) [phone] (fax)  [facility]

## 2024-01-21 DIAGNOSIS — L03213 Periorbital cellulitis: Secondary | ICD-10-CM | POA: Diagnosis not present

## 2024-01-22 ENCOUNTER — Ambulatory Visit: Admitting: Physical Therapy

## 2024-01-23 ENCOUNTER — Encounter: Payer: Self-pay | Admitting: Family Medicine

## 2024-01-24 ENCOUNTER — Encounter: Payer: Self-pay | Admitting: Family Medicine

## 2024-01-24 MED ORDER — PREDNISONE 20 MG PO TABS: 20.0000 mg | ORAL_TABLET | Freq: Every day | ORAL | 0 refills | Status: DC

## 2024-01-24 NOTE — Telephone Encounter (Signed)
Please see the message below and advise.

## 2024-01-30 ENCOUNTER — Ambulatory Visit: Attending: Neurology | Admitting: Physical Therapy

## 2024-01-30 DIAGNOSIS — M5459 Other low back pain: Secondary | ICD-10-CM | POA: Diagnosis not present

## 2024-01-30 DIAGNOSIS — R262 Difficulty in walking, not elsewhere classified: Secondary | ICD-10-CM | POA: Diagnosis not present

## 2024-01-30 DIAGNOSIS — L03213 Periorbital cellulitis: Secondary | ICD-10-CM | POA: Diagnosis not present

## 2024-01-30 DIAGNOSIS — R293 Abnormal posture: Secondary | ICD-10-CM | POA: Insufficient documentation

## 2024-01-30 NOTE — Therapy (Signed)
 OUTPATIENT PHYSICAL THERAPY THORACOLUMBAR TREATMENT   Patient Name: CASE VASSELL MRN: [id number] DOB:07/13/1950, 73 y.o., male Today's Date: 01/30/2024  END OF SESSION:  PT End of Session - 01/30/24 1737     Visit Number 14    Number of Visits 24    Date for PT Re-Evaluation 02/19/24    Authorization Type [employer] [id number]    Authorization - Visit Number 14    Authorization - Number of Visits 24    Progress Note Due on Visit 20    PT Start Time 1730    PT Stop Time 1815    PT Time Calculation (min) 45 min    Activity Tolerance Patient tolerated treatment well    Behavior During Therapy [facility] for tasks assessed/performed            Past Medical History:  Diagnosis Date   Actinic keratosis    Allergy FROM CHILDHOOD   Arthritis    Asthma    COVID-19 01/03/2023   Eczema    GERD (gastroesophageal reflux disease)    Hx of dysplastic nevus 06/06/2006   Mid lower back, slight atypia   Hypothyroidism    Seasonal allergies    Thyroid  disease    Past Surgical History:  Procedure Laterality Date   CATARACT EXTRACTION     COLON SURGERY     COLONOSCOPY WITH PROPOFOL  N/A 11/18/2014   Procedure: COLONOSCOPY WITH PROPOFOL ;  Surgeon: Lamar ONEIDA Holmes, MD;  Location: [facility] ENDOSCOPY;  Service: Endoscopy;  Laterality: N/A;   COLONOSCOPY WITH PROPOFOL  N/A 12/13/2021   Procedure: COLONOSCOPY WITH PROPOFOL ;  Surgeon: Maryruth Ole ONEIDA, MD;  Location: [facility] ENDOSCOPY;  Service: Endoscopy;  Laterality: N/A;   EYE SURGERY     KNEE SURGERY     NASAL SEPTUM SURGERY     Patient Active Problem List   Diagnosis Date Noted   Parkinson's disease (HCC) 09/26/2022   Chronic idiopathic constipation 06/05/2022   Paroxysmal atrial fibrillation (HCC) 03/28/2022   Spondylolysis, lumbar region 03/28/2022   Avitaminosis D 03/28/2022   Myalgia due to statin 03/28/2022   Acquired thrombophilia (HCC) 03/28/2022   Hyperlipidemia 03/28/2022   Abnormal gait 03/28/2022   Mild intermittent asthma without  complication 03/28/2022   Zenker's (hypopharyngeal) diverticulum 03/22/2018   Gastroesophageal reflux disease 03/22/2018   Fatty liver 10/09/2017   BPH (benign prostatic hyperplasia) 11/21/2014   Hypothyroidism 11/21/2014   Inguinal hernia 11/21/2014   OA (osteoarthritis) 11/21/2014   H/O adenomatous polyp of colon 10/14/2014    PCP: Dr. Jon Eva    REFERRING PROVIDER: Dr. Jannett Fairly   REFERRING DIAG: [phone] (ICD-10-CM) - DDD (degenerative disc disease), lumbar  Rationale for Evaluation and Treatment: Rehabilitation  THERAPY DIAG:  Other low back pain  Difficulty in walking, not elsewhere classified  Abnormal posture  ONSET DATE: 3+ years ago (around time of COVID)    SUBJECTIVE:  SUBJECTIVE STATEMENT: Pt states that he is just getting over an infection in his left eye and he has just now recovered after 10 days.    PERTINENT HISTORY:  Pt reports that he started to experience low back pain a couple of years ago when he was walking during the time of COVID. Pain starts around the lower right hip and then spreads to his shoulder blades as the day goes on or the longer he is on his feet. He usually wears a back brace, which helps some. He is walking two blocks every day to build back to walking two miles per day. Patient was walking two miles a day for 5 out of 7 days per week and he was doing this up until this time of COVID. He feels that his balance is ok and that he has not had any falls in the past 6 months and he carries walking stick with him. He also has increased right knee pain that is due to OA and that he has received steroid joint injections which have helped in the past.   PAIN:  Are you having pain? Yes: NPRS scale: 3/10 (currently when sitting), 10/10 with standing or  walking long periods of time   Pain location: right greater trochanter  Pain description: Achy  Aggravating factors: Standing or walking for long periods of time   Relieving factors: Prednisone  for   PRECAUTIONS: None  RED FLAGS: None   WEIGHT BEARING RESTRICTIONS: No  FALLS:  Has patient fallen in last 6 months? No  LIVING ENVIRONMENT: Lives with: lives alone Lives in: House/apartment Stairs: Yes: External: 5 steps; on right going up, on left going up, and can reach both Has following equipment at home: Single point cane and wears life alert monitor    OCCUPATION: Retired, likes to Agricultural consultant at [employer]    PLOF: Independent  PATIENT GOALS: Control pain so that he can return to walking longer distances and standing for longer periods of time to cook again.   NEXT MD VISIT: 9 months; March 2026    OBJECTIVE:  Note: Objective measures were completed at Evaluation unless otherwise noted.  VITALS: BP 116/74 HR 66 SpO2 100%  DIAGNOSTIC FINDINGS:  CLINICAL DATA:  Low back pain, worse with standing. Symptoms over the last year.   EXAM: MRI LUMBAR SPINE WITHOUT CONTRAST   TECHNIQUE: Multiplanar, multisequence MR imaging of the lumbar spine was performed. No intravenous contrast was administered.   COMPARISON:  Radiography 11/30/2020   FINDINGS: Segmentation:  5 lumbar type vertebral bodies.   Alignment:  No malalignment.   Vertebrae: Benign appearing hemangioma within the T12 vertebral body. Discogenic endplate marrow changes throughout the lumbar region, with some active edema at L2-3, L3-4 and L4-5, which could correlate with localized back pain.   Conus medullaris and cauda equina: Conus extends to the L1-2 level. Conus and cauda equina appear normal.   Paraspinal and other soft tissues: Negative   Disc levels:   T11-12: Mild bulging of the disc.  No compressive stenosis.   T12-L1: Unremarkable interspace.   L1-2: Shallow protrusion of the  disc with a tiny amount caudally migrated disc material to the left of midline. Minimal indentation of the thecal sac. No compressive stenosis.   L2-3: Loss of disc height. Endplate osteophytes and bulging of the disc more prominent towards the left. Mild stenosis of the left lateral recess that could possibly cause left-sided neural compression. Discogenic endplate edema which could relate to back pain.  L3-4: Loss of disc height. Endplate osteophytes and minimal bulging of the disc. Mild facet hypertrophy on the left. Mild left foraminal stenosis.   L4-5: Loss of disc height. Endplate osteophytes and bulging of the disc. Facet and ligamentous hypertrophy. Mild stenosis of the lateral recesses and foramina but without visible neural compression. Discogenic endplate edema which could relate to back pain   L5-S1: Disc desiccation with mild loss of height. Endplate osteophytes and mild bulging of the disc. No compressive narrowing of the canal or foramina.   IMPRESSION: Degenerative disc disease throughout the lumbar region with loss of disc height. No apparent compressive stenosis of the canal. Discogenic endplate marrow changes without active edema at L2-3, L3-4 and L4-5, which could correlate with regional axial back pain.   Left lateral recess and foraminal narrowing at L2-3, left foraminal narrowing at L3-4, and bilateral lateral recess and foraminal narrowing at L4-5. None of the stenoses are severe or are seen to result in definite neural compression.   Shallow left posterolateral disc herniation at L1-2 with caudal migration of a tiny amount of disc material to the left of midline. This does not appear to cause any significant compressive effect upon the neural structures.     Electronically Signed   By: Oneil Officer M.D.   On: 07/14/2021 23:01  PATIENT SURVEYS:  Modified Oswestry:  MODIFIED OSWESTRY DISABILITY SCALE  Date: 11/27/23 Score  Pain intensity 3 =   Pain medication provides me with moderate relief from pain.  2. Personal care (washing, dressing, etc.) 1 =  I can take care of myself normally, but it increases my pain.  3. Lifting 3 = Pain prevents me from lifting heavy weights, but I can manage (5) I have hardly any social life because of my pain. light to medium weights if they are conveniently positioned  4. Walking 1 = Pain prevents me from walking more than 1 mile.  5. Sitting 2 =  Pain prevents me from sitting more than 1 hour.  6. Standing 2 =  Pain prevents me from standing more than 1 hour  7. Sleeping 0 = Pain does not prevent me from sleeping well.  8. Social Life 2 = Pain prevents me from participating in more energetic activities (eg. sports, dancing).  9. Traveling 1 =  I can travel anywhere, but it increases my pain.  10. Employment/ Homemaking 1 = My normal homemaking/job activities increase my pain, but I can still perform all that is required of me  Total 16/50= 32%   Interpretation of scores: Score Category Description  0-20% Minimal Disability The patient can cope with most living activities. Usually no treatment is indicated apart from advice on lifting, sitting and exercise  21-40% Moderate Disability The patient experiences more pain and difficulty with sitting, lifting and standing. Travel and social life are more difficult and they may be disabled from work. Personal care, sexual activity and sleeping are not grossly affected, and the patient can usually be managed by conservative means  41-60% Severe Disability Pain remains the main problem in this group, but activities of daily living are affected. These patients require a detailed investigation  61-80% Crippled Back pain impinges on all aspects of the patient's life. Positive intervention is required  81-100% Bed-bound  These patients are either bed-bound or exaggerating their symptoms  Bluford FORBES Zoe DELENA Karon DELENA, et al. Surgery versus conservative management of  stable thoracolumbar fracture: the PRESTO feasibility RCT. [place] ([place]): [employer]; 2021 Nov. (  Health Technology Assessment, No. 25.62.) Appendix 3, Oswestry Disability Index category descriptors. Available from: FindJewelers.cz  Minimally Clinically Important Difference (MCID) = 12.8%  COGNITION: Overall cognitive status: Within functional limits for tasks assessed     SENSATION: WFL  MUSCLE LENGTH: Hamstrings: Right 80 deg; Left 80 deg Ely's: Positive Bilateral   POSTURE: rounded shoulders and forward head  PALPATION: Right glute med    LUMBAR ROM:   AROM eval  Flexion 100%  Extension 75%*  Right lateral flexion 100%*  Left lateral flexion 100%  Right rotation 100%  Left rotation 100%   (Blank rows = not tested)  LOWER EXTREMITY ROM:     Active  Right eval Left eval  Hip flexion    Hip extension    Hip abduction    Hip adduction    Hip internal rotation    Hip external rotation    Knee flexion    Knee extension    Ankle dorsiflexion    Ankle plantarflexion    Ankle inversion    Ankle eversion     (Blank rows = not tested)  LOWER EXTREMITY MMT:    MMT Right eval Left eval Right  01/07/24 Left  01/07/24    Hip flexion 4 4 4 4   Hip extension (supine)  3 3 4 4   Hip abduction 4 4 4 4   Hip adduction 4 4    Hip internal rotation 4 4    Hip external rotation 4 4    Knee flexion 4 4    Knee extension 4 4    Ankle dorsiflexion 4+ 4+    Ankle plantarflexion      Ankle inversion      Ankle eversion       (Blank rows = not tested)  Sahrmann: Grade  4  LUMBAR SPECIAL TESTS:  Straight leg raise test: Negative, SI Compression/distraction test: Negative, FABER test: Positive Bilateral , and FADIR + RLE   FUNCTIONAL TESTS:  6 minute walk test: 1,400 ft  10 meter walk test: NT   GAIT:  Not assessed    TREATMENT DATE:   01/30/24: THEREX   Nu-Step with seat and arms at 11 for 6 min and resistance at 3.     Quadruped Cat Cow 1 x 10   -mod VC to keep elbows from flexing and extending and to increase thoracic extension by looking up  Modified Thomas Stretch on LLE  WITH #3 AW 2 x 60 sec  Modified Thomas Stretch on LLE with 2 x #3 AW 2x 60 sec   NMR  Modified Cat Cow in standing  1 x 10  -mod VC to keep elbows from flexing and extending and to increase thoracic extension by looking up  Hip Hinge with PVC pipe to provide tactile cues with occiput, mid back, and sacrum 1 x 5 -Pt reports increased pain in lateral joint line on right knee and he needed to stop exercise Quadruped Hip Ext 1 x 10   -Pt struggles to extend hip backwards due weakness and decreas  SELF [facility]ARE HOME MANAGEMENT   Lateral offloader knee brace to provide relief from lateral joint line pain in right knee as a possibility to decrease ongoing lateral knee pain.   Recommendation to take frequent breaks while on his trip walking around [facility]  PATIENT EDUCATION:  Education details: Form and technique for correct performance of exercise and explanation about directional pattern for movement Person educated: Patient Education method: Explanation, Demonstration, Verbal cues, and Handouts  Education comprehension: verbalized understanding, returned demonstration, and verbal cues required  HOME EXERCISE PROGRAM:  Access Code: [email or web address] URL: [email or web address] Date: 01/30/2024 Prepared by: Toribio Servant  Exercises - Seated Hamstring Stretch  - 1 x daily - 7 x weekly - 4 reps - 60 sec hold - Seated Knee Flexion Extension AROM   - 1 x daily - 7 x weekly - 3 sets - 20 reps - Modified Thomas Stretch  - 1 x daily - 7 x weekly - 3 reps - 60 sec hold - Modified Cat Cow on Counter  - 1 x daily - 7 x weekly - 2 sets - 10 reps - 3 sec hold - Seated Thoracic Lumbar Extension  - 1 x daily - 7 x weekly - 2 sets - 10 reps - 3 sec  hold - Supine Lower Trunk Rotation  - 1 x daily - 7 x weekly - 2 sets - 10 reps -  3 sec hold - Supine Shoulder External and Internal Rotation in Abduction with Dumbbell  - 1 x daily - 7 x weekly - 2 sets - 10 reps - 3-5 sec  hold - Standing Right Body Stretch- Stretch to Left    - 1 x daily - 7 x weekly - 3 reps - 60 sec  hold - Prone Press Up On Elbows  - 1 x daily - 7 x weekly - 2 sets - 10 reps - 5 sec   hold - Seated Arm Crossing  - 3-4 x weekly - 3 sets - 10 reps - Shoulder PNF D2 Flexion  - 3-4 x weekly - 3 sets - 10 reps - Standing Hip Abduction with Counter Support  - 3-4 x weekly - 3 sets - 10 reps - Quadruped Bent Leg Hip Extension  - 3-4 x weekly - 3 sets - 10 reps  ASSESSMENT:  CLINICAL IMPRESSION: Pt continues to be limited by lateral joint line pain in right knee especially any exercises that requires flexing his knee or increased weight bearing. He also exhibits decreased thoracic extension, but this is to be expected given his kyphotic posture. He was able to perform thoracic extension with increased verbal cueing. PT recommends that pt seek out additional medical consultation for ongoing right knee pain seeing as this has limited several sessions.  He is in agreement with this plan. 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    OBJECTIVE IMPAIRMENTS: decreased knowledge of condition, difficulty walking, decreased ROM, decreased strength, hypomobility, impaired flexibility, postural dysfunction, and pain.   ACTIVITY LIMITATIONS: carrying, lifting, bending, sitting, standing, squatting, stairs, bathing, hygiene/grooming, and locomotion level  PARTICIPATION LIMITATIONS: cleaning, laundry, shopping, community activity, and yard work  PERSONAL FACTORS: Age, Time since onset of injury/illness/exacerbation, and 3+ comorbidities:  Parkinson's disease, Hypothyroidism, h/o lumbar spondylosis   are also affecting patient's functional outcome.   REHAB POTENTIAL: Fair chronicity of condition and repeated past bouts of physical therapy    CLINICAL DECISION MAKING: Stable/uncomplicated  EVALUATION COMPLEXITY: Low   GOALS: Goals reviewed with patient? No  SHORT TERM GOALS: Target date: 12/11/2023  Patient will demonstrate undestanding of home exercise plan by performing exercises correctly with evidence of good carry over with min to no verbal or tactile cues .   Baseline: NT  12/10/23: Performing independently  Goal status: ACHIEVED      LONG TERM GOALS: Target date: 02/19/2024  Patient will improve modified Oswestry Disability Index (MODI) score by >=13% as evidence of the minimal statistically significant change for improvement with low back pain disability and improvement in low back function (Copay et al, 2008) Baseline: 16/50 (32%) 01/07/24: 15/50 (30%)  Goal status: ONGOING   2.  Patient will improve hip strength by 1/3 grade MMT (I.E 4- to 4+) and abdominal strength by >= 1 grade for improved lumbar stability and to assist low back in absorbing mechanical forces in weight bearing positions for improved symptom response. Baseline: Hip Flex  R/L 4/4 , Hip Ext R/L 3/3, Hip Abd  R/L 4/4, Saharmann Level 3   01/07/24: Hip Flex R/L 4/4, Hip Ext (Supine) R/L 4-/4-, Hip Abd R/L 4/4, Sahrmann level 4   Goal status: ONGOING   3. Patient will be able to ambulate for 6 minutes without needing a standing or seated rest break due to pain and he will improve his distance by >=82 meters (269 ft) as evidence of an improved minimal detectable change or progress in aerobic endurance and lumbar function (Steffy and Seney 2008).  Baseline: 1,400 ft 01/09/24: 1,400 ft   Goal status: ONGOING     PLAN:  PT FREQUENCY: 1-2x/week  PT DURATION: 12 weeks  PLANNED INTERVENTIONS: 97164- PT Re-evaluation, 97750- Physical Performance  Testing, 97110-Therapeutic exercises, 97530- Therapeutic activity, 97112- Neuromuscular re-education, 97535- Self Care, 02859- Manual therapy, [phone]- Gait training, [phone]- Aquatic Therapy, [phone]- Electrical stimulation (unattended), [phone]- Electrical stimulation (manual), [id number]- Traction (mechanical), 20560 (1-2 muscles), 20561 (3+ muscles)- Dry Needling, Patient/Family education, Balance training, Stair training, Taping, Joint mobilization, Joint manipulation, Spinal manipulation, Spinal mobilization, Vestibular training, DME instructions, Cryotherapy, and Moist heat.  PLAN FOR NEXT SESSION:   Nu-Step with emphasis on RPE and reaching moderate intensity. Focus on improving foot clearance: Standing Marches and heel and toe raises.   Progress hip abduction strengthening exercise and focus on ambulating.   Toribio Servant PT, DPT  [facility] [address] [place], [address] Phone: [phone]   Fax:  [phone]

## 2024-02-06 ENCOUNTER — Encounter: Admitting: Physical Therapy

## 2024-02-07 ENCOUNTER — Ambulatory Visit: Admitting: Physical Therapy

## 2024-02-07 ENCOUNTER — Telehealth: Payer: Self-pay | Admitting: Physical Therapy

## 2024-02-07 NOTE — Telephone Encounter (Signed)
 Pt reports that he forgot about today's apt and he is unable to make later time. He confirmed that he knows that his next apt is next Tuesday at 9:45 and he will be there.

## 2024-02-08 ENCOUNTER — Telehealth: Payer: Self-pay

## 2024-02-08 MED ORDER — COVID-19 MRNA VAC-TRIS(PFIZER) 30 MCG/0.3ML IM SUSY: 0.3000 mL | PREFILLED_SYRINGE | Freq: Once | INTRAMUSCULAR | 0 refills | Status: AC

## 2024-02-08 NOTE — Telephone Encounter (Signed)
 Copied from CRM #[id number]. Topic: Clinical - Medication Question >> Feb 08, 2024  1:35 PM Sophia H wrote: Reason for CRM: Patient is requesting an RX for the covid vaccine. Preferred brand is Field seismologist is [id number] - [place], [place] - [address]   Please advise, ty # [phone].

## 2024-02-12 ENCOUNTER — Ambulatory Visit

## 2024-02-12 DIAGNOSIS — M5459 Other low back pain: Secondary | ICD-10-CM

## 2024-02-12 DIAGNOSIS — R293 Abnormal posture: Secondary | ICD-10-CM | POA: Diagnosis not present

## 2024-02-12 DIAGNOSIS — R262 Difficulty in walking, not elsewhere classified: Secondary | ICD-10-CM | POA: Diagnosis not present

## 2024-02-12 NOTE — Therapy (Addendum)
 OUTPATIENT PHYSICAL THERAPY THORACOLUMBAR RE-EVALUATION    Patient Name: Daniel [name] MRN: [id number] DOB:1951/01/14, 73 y.o., male Today's Date: 02/12/2024  END OF SESSION:  PT End of Session - 02/12/24 0949     Visit Number 15    Number of Visits 24    Date for PT Re-Evaluation 02/19/24    Authorization Type [employer] [id number]    Authorization - Number of Visits 24    Progress Note Due on Visit 20    PT Start Time [phone]    PT Stop Time 1020    PT Time Calculation (min) 31 min    Activity Tolerance Patient tolerated treatment well    Behavior During Therapy [facility] for tasks assessed/performed             Past Medical History:  Diagnosis Date   Actinic keratosis    Allergy FROM CHILDHOOD   Arthritis    Asthma    COVID-19 01/03/2023   Eczema    GERD (gastroesophageal reflux disease)    Hx of dysplastic nevus 06/06/2006   Mid lower back, slight atypia   Hypothyroidism    Seasonal allergies    Thyroid  disease    Past Surgical History:  Procedure Laterality Date   CATARACT EXTRACTION     COLON SURGERY     COLONOSCOPY WITH PROPOFOL  N/A 11/18/2014   Procedure: COLONOSCOPY WITH PROPOFOL ;  Surgeon: Lamar ONEIDA Holmes, MD;  Location: [facility] ENDOSCOPY;  Service: Endoscopy;  Laterality: N/A;   COLONOSCOPY WITH PROPOFOL  N/A 12/13/2021   Procedure: COLONOSCOPY WITH PROPOFOL ;  Surgeon: Maryruth Ole ONEIDA, MD;  Location: [facility] ENDOSCOPY;  Service: Endoscopy;  Laterality: N/A;   EYE SURGERY     KNEE SURGERY     NASAL SEPTUM SURGERY     Patient Active Problem List   Diagnosis Date Noted   Parkinson's disease (HCC) 09/26/2022   Chronic idiopathic constipation 06/05/2022   Paroxysmal atrial fibrillation (HCC) 03/28/2022   Spondylolysis, lumbar region 03/28/2022   Avitaminosis D 03/28/2022   Myalgia due to statin 03/28/2022   Acquired thrombophilia (HCC) 03/28/2022   Hyperlipidemia 03/28/2022   Abnormal gait 03/28/2022   Mild intermittent asthma without complication 03/28/2022    Zenker's (hypopharyngeal) diverticulum 03/22/2018   Gastroesophageal reflux disease 03/22/2018   Fatty liver 10/09/2017   BPH (benign prostatic hyperplasia) 11/21/2014   Hypothyroidism 11/21/2014   Inguinal hernia 11/21/2014   OA (osteoarthritis) 11/21/2014   H/O adenomatous polyp of colon 10/14/2014    PCP: Dr. Jon Eva    REFERRING PROVIDER: Dr. Jannett Fairly   REFERRING DIAG: [phone] (ICD-10-CM) - DDD (degenerative disc disease), lumbar  Rationale for Evaluation and Treatment: Rehabilitation  THERAPY DIAG:  Other low back pain  Difficulty in walking, not elsewhere classified  ONSET DATE: 3+ years ago (around time of COVID)    SUBJECTIVE:  SUBJECTIVE STATEMENT: Has to be out by 10:20 am. Back feels weak and a little sore, always sore when he gets up in the morning. No back pain currently. Was pretty good after last session. R knee is not feeling real good. Does not hurt but feels weak (R lateral knee joint).   PERTINENT HISTORY:  Pt reports that he started to experience low back pain a couple of years ago when he was walking during the time of COVID. Pain starts around the lower right hip and then spreads to his shoulder blades as the day goes on or the longer he is on his feet. He usually wears a back brace, which helps some. He is walking two blocks every day to build back to walking two miles per day. Patient was walking two miles a day for 5 out of 7 days per week and he was doing this up until this time of COVID. He feels that his balance is ok and that he has not had any falls in the past 6 months and he carries walking stick with him. He also has increased right knee pain that is due to OA and that he has received steroid joint injections which have helped in the past.   PAIN:  Are  you having pain? Yes: NPRS scale: /10 (currently when sitting), /10 with standing or walking long periods of time   Pain location: right greater trochanter  Pain description: Achy  Aggravating factors: Standing or walking for long periods of time   Relieving factors: Prednisone  for   PRECAUTIONS: None  RED FLAGS: None   WEIGHT BEARING RESTRICTIONS: No  FALLS:  Has patient fallen in last 6 months? No  LIVING ENVIRONMENT: Lives with: lives alone Lives in: House/apartment Stairs: Yes: External: 5 steps; on right going up, on left going up, and can reach both Has following equipment at home: Single point cane and wears life alert monitor    OCCUPATION: Retired, likes to Agricultural consultant at [employer]    PLOF: Independent  PATIENT GOALS: Control pain so that he can return to walking longer distances and standing for longer periods of time to cook again.   NEXT MD VISIT: 9 months; March 2026    OBJECTIVE:  Note: Objective measures were completed at Evaluation unless otherwise noted.  VITALS: BP 116/74 HR 66 SpO2 100%  DIAGNOSTIC FINDINGS:  CLINICAL DATA:  Low back pain, worse with standing. Symptoms over the last year.   EXAM: MRI LUMBAR SPINE WITHOUT CONTRAST   TECHNIQUE: Multiplanar, multisequence MR imaging of the lumbar spine was performed. No intravenous contrast was administered.   COMPARISON:  Radiography 11/30/2020   FINDINGS: Segmentation:  5 lumbar type vertebral bodies.   Alignment:  No malalignment.   Vertebrae: Benign appearing hemangioma within the T12 vertebral body. Discogenic endplate marrow changes throughout the lumbar region, with some active edema at L2-3, L3-4 and L4-5, which could correlate with localized back pain.   Conus medullaris and cauda equina: Conus extends to the L1-2 level. Conus and cauda equina appear normal.   Paraspinal and other soft tissues: Negative   Disc levels:   T11-12: Mild bulging of the disc.  No compressive  stenosis.   T12-L1: Unremarkable interspace.   L1-2: Shallow protrusion of the disc with a tiny amount caudally migrated disc material to the left of midline. Minimal indentation of the thecal sac. No compressive stenosis.   L2-3: Loss of disc height. Endplate osteophytes and bulging of the disc more prominent towards  the left. Mild stenosis of the left lateral recess that could possibly cause left-sided neural compression. Discogenic endplate edema which could relate to back pain.   L3-4: Loss of disc height. Endplate osteophytes and minimal bulging of the disc. Mild facet hypertrophy on the left. Mild left foraminal stenosis.   L4-5: Loss of disc height. Endplate osteophytes and bulging of the disc. Facet and ligamentous hypertrophy. Mild stenosis of the lateral recesses and foramina but without visible neural compression. Discogenic endplate edema which could relate to back pain   L5-S1: Disc desiccation with mild loss of height. Endplate osteophytes and mild bulging of the disc. No compressive narrowing of the canal or foramina.   IMPRESSION: Degenerative disc disease throughout the lumbar region with loss of disc height. No apparent compressive stenosis of the canal. Discogenic endplate marrow changes without active edema at L2-3, L3-4 and L4-5, which could correlate with regional axial back pain.   Left lateral recess and foraminal narrowing at L2-3, left foraminal narrowing at L3-4, and bilateral lateral recess and foraminal narrowing at L4-5. None of the stenoses are severe or are seen to result in definite neural compression.   Shallow left posterolateral disc herniation at L1-2 with caudal migration of a tiny amount of disc material to the left of midline. This does not appear to cause any significant compressive effect upon the neural structures.     Electronically Signed   By: Oneil Officer M.D.   On: 07/14/2021 23:01  PATIENT SURVEYS:  Modified Oswestry:   MODIFIED OSWESTRY DISABILITY SCALE  Date: 11/27/23 Score  Pain intensity 3 =  Pain medication provides me with moderate relief from pain.  2. Personal care (washing, dressing, etc.) 1 =  I can take care of myself normally, but it increases my pain.  3. Lifting 3 = Pain prevents me from lifting heavy weights, but I can manage (5) I have hardly any social life because of my pain. light to medium weights if they are conveniently positioned  4. Walking 1 = Pain prevents me from walking more than 1 mile.  5. Sitting 2 =  Pain prevents me from sitting more than 1 hour.  6. Standing 2 =  Pain prevents me from standing more than 1 hour  7. Sleeping 0 = Pain does not prevent me from sleeping well.  8. Social Life 2 = Pain prevents me from participating in more energetic activities (eg. sports, dancing).  9. Traveling 1 =  I can travel anywhere, but it increases my pain.  10. Employment/ Homemaking 1 = My normal homemaking/job activities increase my pain, but I can still perform all that is required of me  Total 16/50= 32%   Interpretation of scores: Score Category Description  0-20% Minimal Disability The patient can cope with most living activities. Usually no treatment is indicated apart from advice on lifting, sitting and exercise  21-40% Moderate Disability The patient experiences more pain and difficulty with sitting, lifting and standing. Travel and social life are more difficult and they may be disabled from work. Personal care, sexual activity and sleeping are not grossly affected, and the patient can usually be managed by conservative means  41-60% Severe Disability Pain remains the main problem in this group, but activities of daily living are affected. These patients require a detailed investigation  61-80% Crippled Back pain impinges on all aspects of the patient's life. Positive intervention is required  81-100% Bed-bound  These patients are either bed-bound or exaggerating their symptoms  Bluford FORBES Zoe DELENA Karon DELENA, et al. Surgery versus conservative management of stable thoracolumbar fracture: the PRESTO feasibility RCT. [place]): [employer]; 2021 Nov. [facility] [employer] Assessment, No. 25.62.) Appendix 3, Oswestry Disability Index category descriptors. Available from: FindJewelers.cz  Minimally Clinically Important Difference (MCID) = 12.8%  COGNITION: Overall cognitive status: Within functional limits for tasks assessed     SENSATION: WFL  MUSCLE LENGTH: Hamstrings: Right 80 deg; Left 80 deg Ely's: Positive Bilateral   POSTURE: rounded shoulders and forward head  PALPATION: Right glute med    LUMBAR ROM:   AROM eval  Flexion 100%  Extension 75%*  Right lateral flexion 100%*  Left lateral flexion 100%  Right rotation 100%  Left rotation 100%   (Blank rows = not tested)  LOWER EXTREMITY ROM:     Active  Right eval Left eval  Hip flexion    Hip extension    Hip abduction    Hip adduction    Hip internal rotation    Hip external rotation    Knee flexion    Knee extension    Ankle dorsiflexion    Ankle plantarflexion    Ankle inversion    Ankle eversion     (Blank rows = not tested)  LOWER EXTREMITY MMT:    MMT Right eval Left eval Right  01/07/24 Left  01/07/24    Hip flexion 4 4 4 4   Hip extension (supine)  3 3 4 4   Hip abduction 4 4 4 4   Hip adduction 4 4    Hip internal rotation 4 4    Hip external rotation 4 4    Knee flexion 4 4    Knee extension 4 4    Ankle dorsiflexion 4+ 4+    Ankle plantarflexion      Ankle inversion      Ankle eversion       (Blank rows = not tested)  Sahrmann: Grade  4  LUMBAR SPECIAL TESTS:  Straight leg raise test: Negative, SI Compression/distraction test: Negative, FABER test: Positive Bilateral , and FADIR + RLE   FUNCTIONAL TESTS:  6 minute walk test: 1,400 ft  10 meter walk test: NT   GAIT:  Not assessed    TREATMENT DATE:    02/12/24:  Neuromuscular re education Performed with the intent on improving thoracic extension, trunk and glute muscle activation to decrease stress to low back and to improve posture.    Nu-Step with seat and arms at 11 for 5 min and resistance at 3.     Quadruped Cat Cow 2 x 10  to promote thoracic extension -mod VC to keep elbows from flexing   Hooklying B scapular retraction 10x2 with 5 second holds to promote thoracic extension   Modified Thomas Stretch on LLE  WITH #3 AW 5 seconds. Low back pain, even with table in reclined position   Reclined hooklying hip extension isometrics, leg straight with transversus abdominis and glute max muscle activation  R 10x2 with 5 second holds  L 10x2 with 5 second holds    Good glute max muscle activation and slight hip flexor stretch reported.   Improved exercise technique, movement at target joints, use of target muscles after mod verbal, visual, tactile cues.    PATIENT EDUCATION:  Education details: Form and technique for correct performance of exercise and explanation about directional pattern for movement Person educated: Patient Education method: Explanation, Demonstration, Verbal cues, and Handouts Education comprehension: verbalized understanding, returned demonstration, and verbal  cues required  HOME EXERCISE PROGRAM:  Access Code: [email or web address] URL: [email or web address] Date: 01/30/2024 Prepared by: Toribio Servant  Exercises - Seated Hamstring Stretch  - 1 x daily - 7 x weekly - 4 reps - 60 sec hold - Seated Knee Flexion Extension AROM   - 1 x daily - 7 x weekly - 3 sets - 20 reps - Modified Thomas Stretch  - 1 x daily - 7 x weekly - 3 reps - 60 sec hold - Modified Cat Cow on Counter  - 1 x daily - 7 x weekly - 2 sets - 10 reps - 3 sec hold - Seated Thoracic Lumbar Extension  - 1 x daily - 7 x weekly - 2 sets - 10 reps - 3 sec  hold - Supine Lower Trunk Rotation  - 1 x daily - 7 x weekly - 2 sets - 10 reps -  3 sec hold - Supine Shoulder External and Internal Rotation in Abduction with Dumbbell  - 1 x daily - 7 x weekly - 2 sets - 10 reps - 3-5 sec  hold - Standing Right Body Stretch- Stretch to Left    - 1 x daily - 7 x weekly - 3 reps - 60 sec  hold - Prone Press Up On Elbows  - 1 x daily - 7 x weekly - 2 sets - 10 reps - 5 sec   hold - Seated Arm Crossing  - 3-4 x weekly - 3 sets - 10 reps - Shoulder PNF D2 Flexion  - 3-4 x weekly - 3 sets - 10 reps - Standing Hip Abduction with Counter Support  - 3-4 x weekly - 3 sets - 10 reps - Quadruped Bent Leg Hip Extension  - 3-4 x weekly - 3 sets - 10 reps  ASSESSMENT:  CLINICAL IMPRESSION:  Session ended at 10:20 am per pt request secondary to multiple meetings. Continued PT POC. Worked on thoracic extension, transversus abdominis and glute max activation to decrease stress to low back. Pt tolerated session well without aggravation of symptoms. Pt will benefit from continued skilled physical therapy services to decrease pain, improve strength and function.       OBJECTIVE IMPAIRMENTS: decreased knowledge of condition, difficulty walking, decreased ROM, decreased strength, hypomobility, impaired flexibility, postural dysfunction, and pain.   ACTIVITY LIMITATIONS: carrying, lifting, bending, sitting, standing, squatting, stairs, bathing, hygiene/grooming, and locomotion level  PARTICIPATION LIMITATIONS: cleaning, laundry, shopping, community activity, and yard work  PERSONAL FACTORS: Age, Time since onset of injury/illness/exacerbation, and 3+ comorbidities: Parkinson's disease, Hypothyroidism, h/o lumbar spondylosis   are also affecting patient's functional outcome.   REHAB POTENTIAL: Fair chronicity of condition and repeated past bouts of physical therapy    CLINICAL DECISION MAKING: Stable/uncomplicated  EVALUATION COMPLEXITY: Low   GOALS: Goals reviewed with patient? No  SHORT TERM GOALS: Target date: 12/11/2023  Patient will  demonstrate undestanding of home exercise plan by performing exercises correctly with evidence of good carry over with min to no verbal or tactile cues .   Baseline: NT  12/10/23: Performing independently  Goal status: ACHIEVED      LONG TERM GOALS: Target date: 02/19/2024  Patient will improve modified Oswestry Disability Index (MODI) score by >=13% as evidence of the minimal statistically significant change for improvement with low back pain disability and improvement in low back function (Copay et al, 2008) Baseline: 16/50 (32%) 01/07/24: 15/50 (30%)  Goal status: ONGOING   2.  Patient will improve hip strength by 1/3 grade MMT (  I.E 4- to 4+) and abdominal strength by >= 1 grade for improved lumbar stability and to assist low back in absorbing mechanical forces in weight bearing positions for improved symptom response. Baseline: Hip Flex  R/L 4/4 , Hip Ext R/L 3/3, Hip Abd  R/L 4/4, Saharmann Level 3   01/07/24: Hip Flex R/L 4/4, Hip Ext (Supine) R/L 4-/4-, Hip Abd R/L 4/4, Sahrmann level 4   Goal status: ONGOING   3. Patient will be able to ambulate for 6 minutes without needing a standing or seated rest break due to pain and he will improve his distance by >=82 meters (269 ft) as evidence of an improved minimal detectable change or progress in aerobic endurance and lumbar function (Steffy and Seney 2008).  Baseline: 1,400 ft 01/09/24: 1,400 ft   Goal status: ONGOING     PLAN:  PT FREQUENCY: 1-2x/week  PT DURATION: 12 weeks  PLANNED INTERVENTIONS: 97164- PT Re-evaluation, 97750- Physical Performance Testing, 97110-Therapeutic exercises, 97530- Therapeutic activity, 97112- Neuromuscular re-education, 97535- Self Care, 02859- Manual therapy, [phone]- Gait training, [phone]- Aquatic Therapy, [phone]- Electrical stimulation (unattended), [phone]- Electrical stimulation (manual), [id number]- Traction (mechanical), 20560 (1-2 muscles), 20561 (3+ muscles)- Dry Needling, Patient/Family education, Balance  training, Stair training, Taping, Joint mobilization, Joint manipulation, Spinal manipulation, Spinal mobilization, Vestibular training, DME instructions, Cryotherapy, and Moist heat.  PLAN FOR NEXT SESSION:   Nu-Step with emphasis on RPE and reaching moderate intensity. Focus on improving foot clearance: Standing Marches and heel and toe raises.   Progress hip abduction strengthening exercise and focus on ambulating.   Emil Glassman PT, DPT  [facility] [address] [place], [address] Phone: [phone]   Fax:  [phone]

## 2024-02-13 DIAGNOSIS — E538 Deficiency of other specified B group vitamins: Secondary | ICD-10-CM | POA: Diagnosis not present

## 2024-02-14 ENCOUNTER — Ambulatory Visit: Admitting: Physical Therapy

## 2024-02-18 ENCOUNTER — Ambulatory Visit: Admitting: Physical Therapy

## 2024-02-18 ENCOUNTER — Telehealth: Payer: Self-pay | Admitting: Physical Therapy

## 2024-02-18 NOTE — Addendum Note (Signed)
 Addended by: THEOTIS TORIBIO PARAS on: 02/18/2024 09:49 AM   Modules accepted: Orders

## 2024-02-18 NOTE — Telephone Encounter (Signed)
 Called pt to inquire about absence from apt. Pt reports that he had previously cancelled the apt because he is out of town on trip to [place] . He does plan on being at next apt on Wednesday.

## 2024-02-18 NOTE — Addendum Note (Signed)
 Addended by: THEOTIS TORIBIO PARAS on: 02/18/2024 10:36 AM   Modules accepted: Orders

## 2024-02-19 ENCOUNTER — Ambulatory Visit (INDEPENDENT_AMBULATORY_CARE_PROVIDER_SITE_OTHER)

## 2024-02-19 DIAGNOSIS — Z23 Encounter for immunization: Secondary | ICD-10-CM

## 2024-02-20 ENCOUNTER — Ambulatory Visit: Admitting: Physical Therapy

## 2024-02-20 DIAGNOSIS — R293 Abnormal posture: Secondary | ICD-10-CM | POA: Diagnosis not present

## 2024-02-20 DIAGNOSIS — M5459 Other low back pain: Secondary | ICD-10-CM

## 2024-02-20 DIAGNOSIS — R262 Difficulty in walking, not elsewhere classified: Secondary | ICD-10-CM | POA: Diagnosis not present

## 2024-02-20 NOTE — Therapy (Signed)
 OUTPATIENT PHYSICAL THERAPY THORACOLUMBAR TREATMENT    Patient Name: Daniel [name] MRN: [id number] DOB:1951-04-28, 73 y.o., male Today's Date: 02/20/2024  END OF SESSION:  PT End of Session - 02/20/24 0951     Visit Number 16    Number of Visits 24    Date for Recertification  04/14/24    Authorization Type [id number]    Authorization - Visit Number 16    Authorization - Number of Visits 24    Progress Note Due on Visit 20    PT Start Time 0945    PT Stop Time 1030    PT Time Calculation (min) 45 min    Activity Tolerance Patient tolerated treatment well    Behavior During Therapy [facility] for tasks assessed/performed             Past Medical History:  Diagnosis Date   Actinic keratosis    Allergy FROM CHILDHOOD   Arthritis    Asthma    COVID-19 01/03/2023   Eczema    GERD (gastroesophageal reflux disease)    Hx of dysplastic nevus 06/06/2006   Mid lower back, slight atypia   Hypothyroidism    Seasonal allergies    Thyroid  disease    Past Surgical History:  Procedure Laterality Date   CATARACT EXTRACTION     COLON SURGERY     COLONOSCOPY WITH PROPOFOL  N/A 11/18/2014   Procedure: COLONOSCOPY WITH PROPOFOL ;  Surgeon: Lamar ONEIDA Holmes, MD;  Location: [facility] ENDOSCOPY;  Service: Endoscopy;  Laterality: N/A;   COLONOSCOPY WITH PROPOFOL  N/A 12/13/2021   Procedure: COLONOSCOPY WITH PROPOFOL ;  Surgeon: Maryruth Ole ONEIDA, MD;  Location: [facility] ENDOSCOPY;  Service: Endoscopy;  Laterality: N/A;   EYE SURGERY     KNEE SURGERY     NASAL SEPTUM SURGERY     Patient Active Problem List   Diagnosis Date Noted   Parkinson's disease (HCC) 09/26/2022   Chronic idiopathic constipation 06/05/2022   Paroxysmal atrial fibrillation (HCC) 03/28/2022   Spondylolysis, lumbar region 03/28/2022   Avitaminosis D 03/28/2022   Myalgia due to statin 03/28/2022   Acquired thrombophilia 03/28/2022   Hyperlipidemia 03/28/2022   Abnormal gait 03/28/2022   Mild intermittent asthma without  complication 03/28/2022   Zenker's (hypopharyngeal) diverticulum 03/22/2018   Gastroesophageal reflux disease 03/22/2018   Fatty liver 10/09/2017   BPH (benign prostatic hyperplasia) 11/21/2014   Hypothyroidism 11/21/2014   Inguinal hernia 11/21/2014   OA (osteoarthritis) 11/21/2014   H/O adenomatous polyp of colon 10/14/2014    PCP: Dr. Jon Eva    REFERRING PROVIDER: Dr. Jannett Fairly   REFERRING DIAG: [id number] (ICD-10-CM) - DDD (degenerative disc disease), lumbar  Rationale for Evaluation and Treatment: Rehabilitation  THERAPY DIAG:  Other low back pain  Difficulty in walking, not elsewhere classified  Abnormal posture  ONSET DATE: 3+ years ago (around time of COVID)    SUBJECTIVE:  SUBJECTIVE STATEMENT: Pt reports that his trip to [facility] went relatively well. He was able to walk and take seated rest breaks without being limited by pain.    PERTINENT HISTORY:  Pt reports that he started to experience low back pain a couple of years ago when he was walking during the time of COVID. Pain starts around the lower right hip and then spreads to his shoulder blades as the day goes on or the longer he is on his feet. He usually wears a back brace, which helps some. He is walking two blocks every day to build back to walking two miles per day. Patient was walking two miles a day for 5 out of 7 days per week and he was doing this up until this time of COVID. He feels that his balance is ok and that he has not had any falls in the past 6 months and he carries walking stick with him. He also has increased right knee pain that is due to OA and that he has received steroid joint injections which have helped in the past.   PAIN:  Are you having pain? Yes: NPRS scale: 3/10 (currently when  sitting)  Pain location:L3-L5 Central Spinous Process    Pain description: Achy  Aggravating factors: Standing or walking for long periods of time   Relieving factors: Prednisone  for   PRECAUTIONS: None  RED FLAGS: None   WEIGHT BEARING RESTRICTIONS: No  FALLS:  Has patient fallen in last 6 months? No  LIVING ENVIRONMENT: Lives with: lives alone Lives in: House/apartment Stairs: Yes: External: 5 steps; on right going up, on left going up, and can reach both Has following equipment at home: Single point cane and wears life alert monitor    OCCUPATION: Retired, likes to Agricultural consultant at [employer]    PLOF: Independent  PATIENT GOALS: Control pain so that he can return to walking longer distances and standing for longer periods of time to cook again.   NEXT MD VISIT: 9 months; March 2026    OBJECTIVE:  Note: Objective measures were completed at Evaluation unless otherwise noted.  VITALS: BP 116/74 HR 66 SpO2 100%  DIAGNOSTIC FINDINGS:  CLINICAL DATA:  Low back pain, worse with standing. Symptoms over the last year.   EXAM: MRI LUMBAR SPINE WITHOUT CONTRAST   TECHNIQUE: Multiplanar, multisequence MR imaging of the lumbar spine was performed. No intravenous contrast was administered.   COMPARISON:  Radiography 11/30/2020   FINDINGS: Segmentation:  5 lumbar type vertebral bodies.   Alignment:  No malalignment.   Vertebrae: Benign appearing hemangioma within the T12 vertebral body. Discogenic endplate marrow changes throughout the lumbar region, with some active edema at L2-3, L3-4 and L4-5, which could correlate with localized back pain.   Conus medullaris and cauda equina: Conus extends to the L1-2 level. Conus and cauda equina appear normal.   Paraspinal and other soft tissues: Negative   Disc levels:   T11-12: Mild bulging of the disc.  No compressive stenosis.   T12-L1: Unremarkable interspace.   L1-2: Shallow protrusion of the disc with a tiny  amount caudally migrated disc material to the left of midline. Minimal indentation of the thecal sac. No compressive stenosis.   L2-3: Loss of disc height. Endplate osteophytes and bulging of the disc more prominent towards the left. Mild stenosis of the left lateral recess that could possibly cause left-sided neural compression. Discogenic endplate edema which could relate to back pain.   L3-4: Loss of disc  height. Endplate osteophytes and minimal bulging of the disc. Mild facet hypertrophy on the left. Mild left foraminal stenosis.   L4-5: Loss of disc height. Endplate osteophytes and bulging of the disc. Facet and ligamentous hypertrophy. Mild stenosis of the lateral recesses and foramina but without visible neural compression. Discogenic endplate edema which could relate to back pain   L5-S1: Disc desiccation with mild loss of height. Endplate osteophytes and mild bulging of the disc. No compressive narrowing of the canal or foramina.   IMPRESSION: Degenerative disc disease throughout the lumbar region with loss of disc height. No apparent compressive stenosis of the canal. Discogenic endplate marrow changes without active edema at L2-3, L3-4 and L4-5, which could correlate with regional axial back pain.   Left lateral recess and foraminal narrowing at L2-3, left foraminal narrowing at L3-4, and bilateral lateral recess and foraminal narrowing at L4-5. None of the stenoses are severe or are seen to result in definite neural compression.   Shallow left posterolateral disc herniation at L1-2 with caudal migration of a tiny amount of disc material to the left of midline. This does not appear to cause any significant compressive effect upon the neural structures.     Electronically Signed   By: Oneil Officer M.D.   On: 07/14/2021 23:01  PATIENT SURVEYS:  Modified Oswestry:  MODIFIED OSWESTRY DISABILITY SCALE   Date: 11/27/23 Score Score    Pain intensity 3 =  Pain  medication provides me with moderate relief from pain. 3  2. Personal care (washing, dressing, etc.) 1 =  I can take care of myself normally, but it increases my pain. 2  3. Lifting 3 = Pain prevents me from lifting heavy weights, but I can manage (5) I have hardly any social life because of my pain. light to medium weights if they are conveniently positioned 2  4. Walking 1 = Pain prevents me from walking more than 1 mile. 1  5. Sitting 2 =  Pain prevents me from sitting more than 1 hour. 0  6. Standing 2 =  Pain prevents me from standing more than 1 hour 3  7. Sleeping 0 = Pain does not prevent me from sleeping well. 0  8. Social Life 2 = Pain prevents me from participating in more energetic activities (eg. sports, dancing). 2  9. Traveling 1 =  I can travel anywhere, but it increases my pain. 1  10. Employment/ Homemaking 1 = My normal homemaking/job activities increase my pain, but I can still perform all that is required of me 1  Total 16/50= 32% 15/50 (30%)   Interpretation of scores: Score Category Description  0-20% Minimal Disability The patient can cope with most living activities. Usually no treatment is indicated apart from advice on lifting, sitting and exercise  21-40% Moderate Disability The patient experiences more pain and difficulty with sitting, lifting and standing. Travel and social life are more difficult and they may be disabled from work. Personal care, sexual activity and sleeping are not grossly affected, and the patient can usually be managed by conservative means  41-60% Severe Disability Pain remains the main problem in this group, but activities of daily living are affected. These patients require a detailed investigation  61-80% Crippled Back pain impinges on all aspects of the patient's life. Positive intervention is required  81-100% Bed-bound  These patients are either bed-bound or exaggerating their symptoms  Bluford BRAVO, [place] DELENA Karon DELENA, et al. Surgery versus  conservative management of stable thoracolumbar  fracture: the PRESTO feasibility RCT. [place]): [employer]; 2021 Nov. [facility] Technology Assessment, No. 25.62.) Appendix 3, Oswestry Disability Index category descriptors. Available from: FindJewelers.cz  Minimally Clinically Important Difference (MCID) = 12.8%  COGNITION: Overall cognitive status: Within functional limits for tasks assessed     SENSATION: WFL  MUSCLE LENGTH: Hamstrings: Right 80 deg; Left 80 deg Ely's: Positive Bilateral   POSTURE: rounded shoulders and forward head  PALPATION: Right glute med    LUMBAR ROM:   AROM eval  Flexion 100%  Extension 75%*  Right lateral flexion 100%*  Left lateral flexion 100%  Right rotation 100%  Left rotation 100%   (Blank rows = not tested)  LOWER EXTREMITY ROM:     Active  Right eval Left eval  Hip flexion    Hip extension    Hip abduction    Hip adduction    Hip internal rotation    Hip external rotation    Knee flexion    Knee extension    Ankle dorsiflexion    Ankle plantarflexion    Ankle inversion    Ankle eversion     (Blank rows = not tested)  LOWER EXTREMITY MMT:    MMT Right eval Left eval Right  01/07/24 Left  01/07/24   Right   02/20/24 Left  02/20/24  Hip flexion 4 4 4 4  4+ 4+  Hip extension (supine)  3 3 4 4 4 4   Hip abduction 4 4 4 4  4- 4  Hip adduction 4 4      Hip internal rotation 4 4      Hip external rotation 4 4      Knee flexion 4 4      Knee extension 4 4      Ankle dorsiflexion 4+ 4+      Ankle plantarflexion        Ankle inversion        Ankle eversion         (Blank rows = not tested)  Sahrmann: Grade  4  LUMBAR SPECIAL TESTS:  Straight leg raise test: Negative, SI Compression/distraction test: Negative, FABER test: Positive Bilateral , and FADIR + RLE   FUNCTIONAL TESTS:  6 minute walk test: 1,400 ft  10 meter walk test: NT   GAIT:  Not assessed    TREATMENT DATE:    02/20/24: THERAC   Nu-Step with seat and arms at 10 with resistance at 3 for 5 min   Standing Hip Extension with forward lean for added support 3 x 10   -min VC to increase forward lean and to decrease extension on RLE to avoid pain exacerbation Left Side Lying Right Hip Abduction with knee flexed to 45 degrees 3 x 10   Right Side Lying Left Hip Abduction with knee flexed to 45 degrees 2 x 10  -Pt reports Increased right sided low back pain    THEREX   Hip MMT (See Above)   MODI: 15/50 (30%)     PATIENT EDUCATION:  Education details: Form and technique for correct performance of exercise and explanation about directional pattern for movement Person educated: Patient Education method: Explanation, Demonstration, Verbal cues, and Handouts Education comprehension: verbalized understanding, returned demonstration, and verbal cues required  HOME EXERCISE PROGRAM:  Access Code: [email or web address] URL: [email or web address] Date: 02/20/2024 Prepared by: Toribio Servant  Exercises - Seated Hamstring Stretch  - 1 x daily - 7 x weekly - 4 reps - 60 sec hold - Modified Debby Dales  -  1 x daily - 7 x weekly - 3 reps - 60 sec hold - Modified Cat Cow on Counter  - 1 x daily - 7 x weekly - 2 sets - 10 reps - 3 sec hold - Supine Lower Trunk Rotation  - 1 x daily - 7 x weekly - 2 sets - 10 reps - 3 sec hold - Standing Right Body Stretch- Stretch to Left    - 1 x daily - 7 x weekly - 3 reps - 60 sec  hold - Prone Press Up On Elbows  - 1 x daily - 7 x weekly - 2 sets - 10 reps - 5 sec   hold - Prone Hip Extension on Table  - 3-4 x weekly - 3 sets - 10 reps - Sidelying Bent Knee Lift at 45 Degrees (Mirrored)  - 3-4 x weekly - 3 sets - 10 reps - Clamshell  - 3-4 x weekly - 3 sets - 10 reps  ASSESSMENT:  CLINICAL IMPRESSION: Pt shows limited progress with improving hip strength, low back function, and pain with activity. He is now able to perform more physical activities like vacuuming,  but his pain not improved significantly. He is following up with Dr. Lorelle about ongoing right knee OA and associated pain. He was able to perform all exercises today albeit with modification including decreased weight of lever arm and increased lumbar flexion and he was able to tolerate these modifications without an increase in his pain. He will continue benefit from continued skilled physical therapy services to decrease pain, improve strength and function.     OBJECTIVE IMPAIRMENTS: decreased knowledge of condition, difficulty walking, decreased ROM, decreased strength, hypomobility, impaired flexibility, postural dysfunction, and pain.   ACTIVITY LIMITATIONS: carrying, lifting, bending, sitting, standing, squatting, stairs, bathing, hygiene/grooming, and locomotion level  PARTICIPATION LIMITATIONS: cleaning, laundry, shopping, community activity, and yard work  PERSONAL FACTORS: Age, Time since onset of injury/illness/exacerbation, and 3+ comorbidities: Parkinson's disease, Hypothyroidism, h/o lumbar spondylosis   are also affecting patient's functional outcome.   REHAB POTENTIAL: Fair chronicity of condition and repeated past bouts of physical therapy    CLINICAL DECISION MAKING: Stable/uncomplicated  EVALUATION COMPLEXITY: Low   GOALS: Goals reviewed with patient? No  SHORT TERM GOALS: Target date: 12/11/2023  Patient will demonstrate undestanding of home exercise plan by performing exercises correctly with evidence of good carry over with min to no verbal or tactile cues .   Baseline: NT  12/10/23: Performing independently  Goal status: ACHIEVED      LONG TERM GOALS: Target date: 04/14/2024  Patient will improve modified Oswestry Disability Index (MODI) score by >=13% as evidence of the minimal statistically significant change for improvement with low back pain disability and improvement in low back function (Copay et al, 2008) Baseline: 16/50 (32%) 01/07/24: 15/50 (30%)  Goal  status: ONGOING   2.  Patient will improve hip strength by 1/3 grade MMT (I.E 4- to 4+) and abdominal strength by >= 1 grade for improved lumbar stability and to assist low back in absorbing mechanical forces in weight bearing positions for improved symptom response. Baseline: Hip Flex  R/L 4/4 , Hip Ext R/L 3/3, Hip Abd  R/L 4/4, Saharmann Level 3   01/07/24: Hip Flex R/L 4/4, Hip Ext (Supine) R/L 4-/4-, Hip Abd R/L 4/4, Sahrmann level 4   Goal status: ONGOING   3. Patient will be able to ambulate for 6 minutes without needing a standing or seated rest break due to pain and  he will improve his distance by >=82 meters (269 ft) as evidence of an improved minimal detectable change or progress in aerobic endurance and lumbar function (Steffy and Seney 2008).  Baseline: 1,400 ft 01/09/24: 1,400 ft   Goal status: ONGOING     PLAN:  PT FREQUENCY: 1-2x/week  PT DURATION: 12 weeks  PLANNED INTERVENTIONS: 97164- PT Re-evaluation, 97750- Physical Performance Testing, 97110-Therapeutic exercises, 97530- Therapeutic activity, 97112- Neuromuscular re-education, 97535- Self Care, 02859- Manual therapy, [phone]- Gait training, [phone]- Aquatic Therapy, [phone]- Electrical stimulation (unattended), [phone]- Electrical stimulation (manual), [id number]- Traction (mechanical), 20560 (1-2 muscles), 20561 (3+ muscles)- Dry Needling, Patient/Family education, Balance training, Stair training, Taping, Joint mobilization, Joint manipulation, Spinal manipulation, Spinal mobilization, Vestibular training, DME instructions, Cryotherapy, and Moist heat.  PLAN FOR NEXT SESSION:  Nu-Step with emphasis on RPE and reaching moderate intensity. Continue to progress hip abduction and extension strength. Reassess Sahrmann.      Toribio Servant PT, DPT  [facility] [address], [place], [address] Phone: [phone]   Fax:  [phone]

## 2024-02-21 ENCOUNTER — Ambulatory Visit: Admitting: Family Medicine

## 2024-02-25 ENCOUNTER — Ambulatory Visit: Admitting: Physical Therapy

## 2024-02-25 DIAGNOSIS — M5459 Other low back pain: Secondary | ICD-10-CM

## 2024-02-25 DIAGNOSIS — R293 Abnormal posture: Secondary | ICD-10-CM | POA: Diagnosis not present

## 2024-02-25 DIAGNOSIS — R262 Difficulty in walking, not elsewhere classified: Secondary | ICD-10-CM | POA: Diagnosis not present

## 2024-02-25 NOTE — Therapy (Signed)
 OUTPATIENT PHYSICAL THERAPY THORACOLUMBAR TREATMENT    Patient Name: Daniel [name] MRN: [id number] DOB:[birth year], 73 y.o., male Today's Date: 02/25/2024  END OF SESSION:  PT End of Session - 02/25/24 0909     Visit Number 17    Number of Visits 24    Date for Recertification  04/14/24    Authorization Type [id number]    Authorization - Visit Number 17    Authorization - Number of Visits 24    Progress Note Due on Visit 20    PT Start Time 0905    PT Stop Time 0945    PT Time Calculation (min) 40 min    Activity Tolerance Patient tolerated treatment well    Behavior During Therapy [facility] for tasks assessed/performed              Past Medical History:  Diagnosis Date   Actinic keratosis    Allergy FROM CHILDHOOD   Arthritis    Asthma    COVID-19 01/03/2023   Eczema    GERD (gastroesophageal reflux disease)    Hx of dysplastic nevus 06/06/2006   Mid lower back, slight atypia   Hypothyroidism    Seasonal allergies    Thyroid  disease    Past Surgical History:  Procedure Laterality Date   CATARACT EXTRACTION     COLON SURGERY     COLONOSCOPY WITH PROPOFOL  N/A 11/18/2014   Procedure: COLONOSCOPY WITH PROPOFOL ;  Surgeon: Lamar ONEIDA Holmes, MD;  Location[facility] ENDOSCOPY;  Service: Endoscopy;  Laterality: N/A;   COLONOSCOPY WITH PROPOFOL  N/A 12/13/2021   Procedure: COLONOSCOPY WITH PROPOFOL ;  Surgeon: Maryruth Ole ONEIDA, MD;  Location: [facility] ENDOSCOPY;  Service: Endoscopy;  Laterality: N/A;   EYE SURGERY     KNEE SURGERY     NASAL SEPTUM SURGERY     Patient Active Problem List   Diagnosis Date Noted   Parkinson's disease (HCC) 09/26/2022   Chronic idiopathic constipation 06/05/2022   Paroxysmal atrial fibrillation (HCC) 03/28/2022   Spondylolysis, lumbar region 03/28/2022   Avitaminosis D 03/28/2022   Myalgia due to statin 03/28/2022   Acquired thrombophilia 03/28/2022   Hyperlipidemia 03/28/2022   Abnormal gait 03/28/2022   Mild intermittent asthma without  complication 03/28/2022   Zenker's (hypopharyngeal) diverticulum 03/22/2018   Gastroesophageal reflux disease 03/22/2018   Fatty liver 10/09/2017   BPH (benign prostatic hyperplasia) 11/21/2014   Hypothyroidism 11/21/2014   Inguinal hernia 11/21/2014   OA (osteoarthritis) 11/21/2014   H/O adenomatous polyp of colon 10/14/2014    PCP: Dr. Jon Eva    REFERRING PROVIDER: Dr. Jannett Fairly   REFERRING DIAG: [phone] (ICD-10-CM) - DDD (degenerative disc disease), lumbar  Rationale for Evaluation and Treatment: Rehabilitation  THERAPY DIAG:  Other low back pain  Difficulty in walking, not elsewhere classified  ONSET DATE: 3+ years ago (around time of COVID)    SUBJECTIVE:  SUBJECTIVE STATEMENT: Pt reports increased nausea and he is trying to figure out his medications, because he feels like this is causing his nausea. He continues to perform exercises without being limited by pain.   PERTINENT HISTORY:  Pt reports that he started to experience low back pain a couple of years ago when he was walking during the time of COVID. Pain starts around the lower right hip and then spreads to his shoulder blades as the day goes on or the longer he is on his feet. He usually wears a back brace, which helps some. He is walking two blocks every day to build back to walking two miles per day. Patient was walking two miles a day for 5 out of 7 days per week and he was doing this up until this time of COVID. He feels that his balance is ok and that he has not had any falls in the past 6 months and he carries walking stick with him. He also has increased right knee pain that is due to OA and that he has received steroid joint injections which have helped in the past.   PAIN:  Are you having pain? Yes: NPRS scale:  3/10 (currently when sitting)  Pain location:L3-L5 Central Spinous Process    Pain description: Achy  Aggravating factors: Standing or walking for long periods of time   Relieving factors: Prednisone  for   PRECAUTIONS: None  RED FLAGS: None   WEIGHT BEARING RESTRICTIONS: No  FALLS:  Has patient fallen in last 6 months? No  LIVING ENVIRONMENT: Lives with: lives alone Lives in: House/apartment Stairs: Yes: External: 5 steps; on right going up, on left going up, and can reach both Has following equipment at home: Single point cane and wears life alert monitor    OCCUPATION: Retired, likes to Agricultural consultant at [employer]    PLOF: Independent  PATIENT GOALS: Control pain so that he can return to walking longer distances and standing for longer periods of time to cook again.   NEXT MD VISIT: 9 months; March 2026    OBJECTIVE:  Note: Objective measures were completed at Evaluation unless otherwise noted.  VITALS: BP 116/74 HR 66 SpO2 100%  DIAGNOSTIC FINDINGS:  CLINICAL DATA:  Low back pain, worse with standing. Symptoms over the last year.   EXAM: MRI LUMBAR SPINE WITHOUT CONTRAST   TECHNIQUE: Multiplanar, multisequence MR imaging of the lumbar spine was performed. No intravenous contrast was administered.   COMPARISON:  Radiography 11/30/2020   FINDINGS: Segmentation:  5 lumbar type vertebral bodies.   Alignment:  No malalignment.   Vertebrae: Benign appearing hemangioma within the T12 vertebral body. Discogenic endplate marrow changes throughout the lumbar region, with some active edema at L2-3, L3-4 and L4-5, which could correlate with localized back pain.   Conus medullaris and cauda equina: Conus extends to the L1-2 level. Conus and cauda equina appear normal.   Paraspinal and other soft tissues: Negative   Disc levels:   T11-12: Mild bulging of the disc.  No compressive stenosis.   T12-L1: Unremarkable interspace.   L1-2: Shallow protrusion of  the disc with a tiny amount caudally migrated disc material to the left of midline. Minimal indentation of the thecal sac. No compressive stenosis.   L2-3: Loss of disc height. Endplate osteophytes and bulging of the disc more prominent towards the left. Mild stenosis of the left lateral recess that could possibly cause left-sided neural compression. Discogenic endplate edema which could relate to back pain.  L3-4: Loss of disc height. Endplate osteophytes and minimal bulging of the disc. Mild facet hypertrophy on the left. Mild left foraminal stenosis.   L4-5: Loss of disc height. Endplate osteophytes and bulging of the disc. Facet and ligamentous hypertrophy. Mild stenosis of the lateral recesses and foramina but without visible neural compression. Discogenic endplate edema which could relate to back pain   L5-S1: Disc desiccation with mild loss of height. Endplate osteophytes and mild bulging of the disc. No compressive narrowing of the canal or foramina.   IMPRESSION: Degenerative disc disease throughout the lumbar region with loss of disc height. No apparent compressive stenosis of the canal. Discogenic endplate marrow changes without active edema at L2-3, L3-4 and L4-5, which could correlate with regional axial back pain.   Left lateral recess and foraminal narrowing at L2-3, left foraminal narrowing at L3-4, and bilateral lateral recess and foraminal narrowing at L4-5. None of the stenoses are severe or are seen to result in definite neural compression.   Shallow left posterolateral disc herniation at L1-2 with caudal migration of a tiny amount of disc material to the left of midline. This does not appear to cause any significant compressive effect upon the neural structures.     Electronically Signed   By: Oneil Officer M.D.   On: 07/14/2021 23:01  PATIENT SURVEYS:  Modified Oswestry:  MODIFIED OSWESTRY DISABILITY SCALE   Date: 11/27/23 Score Score    Pain  intensity 3 =  Pain medication provides me with moderate relief from pain. 3  2. Personal care (washing, dressing, etc.) 1 =  I can take care of myself normally, but it increases my pain. 2  3. Lifting 3 = Pain prevents me from lifting heavy weights, but I can manage (5) I have hardly any social life because of my pain. light to medium weights if they are conveniently positioned 2  4. Walking 1 = Pain prevents me from walking more than 1 mile. 1  5. Sitting 2 =  Pain prevents me from sitting more than 1 hour. 0  6. Standing 2 =  Pain prevents me from standing more than 1 hour 3  7. Sleeping 0 = Pain does not prevent me from sleeping well. 0  8. Social Life 2 = Pain prevents me from participating in more energetic activities (eg. sports, dancing). 2  9. Traveling 1 =  I can travel anywhere, but it increases my pain. 1  10. Employment/ Homemaking 1 = My normal homemaking/job activities increase my pain, but I can still perform all that is required of me 1  Total 16/50= 32% 15/50 (30%)   Interpretation of scores: Score Category Description  0-20% Minimal Disability The patient can cope with most living activities. Usually no treatment is indicated apart from advice on lifting, sitting and exercise  21-40% Moderate Disability The patient experiences more pain and difficulty with sitting, lifting and standing. Travel and social life are more difficult and they may be disabled from work. Personal care, sexual activity and sleeping are not grossly affected, and the patient can usually be managed by conservative means  41-60% Severe Disability Pain remains the main problem in this group, but activities of daily living are affected. These patients require a detailed investigation  61-80% Crippled Back pain impinges on all aspects of the patient's life. Positive intervention is required  81-100% Bed-bound  These patients are either bed-bound or exaggerating their symptoms  Bluford BRAVO, Zoe DELENA Karon DELENA, et  al. Surgery versus conservative  management of stable thoracolumbar fracture: the PRESTO feasibility RCT. [place]): [employer]; 2021 Nov. [facility] Technology Assessment, No. 25.62.) Appendix 3, Oswestry Disability Index category descriptors. Available from: FindJewelers.cz  Minimally Clinically Important Difference (MCID) = 12.8%  COGNITION: Overall cognitive status: Within functional limits for tasks assessed     SENSATION: WFL  MUSCLE LENGTH: Hamstrings: Right 80 deg; Left 80 deg Ely's: Positive Bilateral   POSTURE: rounded shoulders and forward head  PALPATION: Right glute med    LUMBAR ROM:   AROM eval  Flexion 100%  Extension 75%*  Right lateral flexion 100%*  Left lateral flexion 100%  Right rotation 100%  Left rotation 100%   (Blank rows = not tested)  LOWER EXTREMITY ROM:     Active  Right eval Left eval  Hip flexion    Hip extension    Hip abduction    Hip adduction    Hip internal rotation    Hip external rotation    Knee flexion    Knee extension    Ankle dorsiflexion    Ankle plantarflexion    Ankle inversion    Ankle eversion     (Blank rows = not tested)  LOWER EXTREMITY MMT:    MMT Right eval Left eval Right  01/07/24 Left  01/07/24   Right   02/20/24 Left  02/20/24  Hip flexion 4 4 4 4  4+ 4+  Hip extension (supine)  3 3 4 4 4 4   Hip extension (standing with forward lean)     4+ 4+  Hip abduction 4 4 4 4  4- 4  Hip adduction 4 4      Hip internal rotation 4 4      Hip external rotation 4 4      Knee flexion 4 4      Knee extension 4 4      Ankle dorsiflexion 4+ 4+      Ankle plantarflexion        Ankle inversion        Ankle eversion         (Blank rows = not tested)  Sahrmann: Grade  4  LUMBAR SPECIAL TESTS:  Straight leg raise test: Negative, SI Compression/distraction test: Negative, FABER test: Positive Bilateral , and FADIR + RLE   FUNCTIONAL TESTS:  6 minute walk test: 1,400  ft  10 meter walk test: NT   GAIT:  Not assessed    TREATMENT DATE:   02/25/24: THERAC   Nu-Step Seat and arms  at 11 for 6 min for 5 min   Sahrmann Test: Level 4; pt loses control due to pain in low back    Left Side Lying Right Hip Abduction with knee flexed to 45 deg 3 x 10   Right Side Lying Left Hip Abduction with knee flexed to 45 deg 3 x 10  Hip Extension in standing modified position: R/L 4/4   Modified Hip Ext 1 x 10 alternating  Modified Hip Ext 2 x 10  -min VC to maintain knee extension   MODI: 19/50 (38%)  THEREX  Lower Trunk Rotation with 3 sec hold 2 x 10   Double Limb Heel Taps in Hook Lying   2 x 10   -red cone used for external cueing to have patient extend knees further by taping area of cone     PATIENT EDUCATION:  Education details: Form and technique for correct performance of exercise and explanation about directional pattern for movement Person educated: Patient  Education method: Explanation, Demonstration, Verbal cues, and Handouts Education comprehension: verbalized understanding, returned demonstration, and verbal cues required  HOME EXERCISE PROGRAM:  Access Code: [email or web address] URL: [email or web address] Date: 02/25/2024 Prepared by: Toribio Servant  Program Notes Heel taps 3 x 10 x 3-4 days per week    Exercises - Seated Hamstring Stretch  - 1 x daily - 7 x weekly - 4 reps - 60 sec hold - Modified Thomas Stretch  - 1 x daily - 7 x weekly - 3 reps - 60 sec hold - Modified Cat Cow on Counter  - 1 x daily - 7 x weekly - 2 sets - 10 reps - 3 sec hold - Supine Lower Trunk Rotation  - 1 x daily - 7 x weekly - 2 sets - 10 reps - 3 sec hold - Standing Right Body Stretch- Stretch to Left    - 1 x daily - 7 x weekly - 3 reps - 60 sec  hold - Prone Press Up On Elbows  - 1 x daily - 7 x weekly - 2 sets - 10 reps - 5 sec   hold - Prone Hip Extension on Table  - 3-4 x weekly - 3 sets - 10 reps - Sidelying Bent Knee Lift at 45 Degrees (Mirrored)   - 3-4 x weekly - 3 sets - 10 reps - Sidelying Bent Knee Lift at 45 Degrees  - 3-4 x weekly - 3 sets - 10 reps  ASSESSMENT:  CLINICAL IMPRESSION: Despite improvement in hip strength and reduction in pain with exercise., he exhibits a decrease in perception of low back function even when compared to baseline. This could be be do to ongoing chronic right sided back pain and right knee pain that is still present with functional activity, which is different than exercises, which have been modified to decrease his pain with forward flexion.  He will continue benefit from continued skilled physical therapy services to decrease pain, improve strength and function.       OBJECTIVE IMPAIRMENTS: decreased knowledge of condition, difficulty walking, decreased ROM, decreased strength, hypomobility, impaired flexibility, postural dysfunction, and pain.   ACTIVITY LIMITATIONS: carrying, lifting, bending, sitting, standing, squatting, stairs, bathing, hygiene/grooming, and locomotion level  PARTICIPATION LIMITATIONS: cleaning, laundry, shopping, community activity, and yard work  PERSONAL FACTORS: Age, Time since onset of injury/illness/exacerbation, and 3+ comorbidities: Parkinson's disease, Hypothyroidism, h/o lumbar spondylosis   are also affecting patient's functional outcome.   REHAB POTENTIAL: Fair chronicity of condition and repeated past bouts of physical therapy    CLINICAL DECISION MAKING: Stable/uncomplicated  EVALUATION COMPLEXITY: Low   GOALS: Goals reviewed with patient? No  SHORT TERM GOALS: Target date: 12/11/2023  Patient will demonstrate undestanding of home exercise plan by performing exercises correctly with evidence of good carry over with min to no verbal or tactile cues .   Baseline: NT  12/10/23: Performing independently  Goal status: ACHIEVED      LONG TERM GOALS: Target date: 04/14/2024  Patient will improve modified Oswestry Disability Index (MODI) score by >=13% as  evidence of the minimal statistically significant change for improvement with low back pain disability and improvement in low back function (Copay et al, 2008) Baseline: 16/50 (32%) 01/07/24: 15/50 (30%) 02/25/24: 19/50 (38%) Goal status: ONGOING   2.  Patient will improve hip strength by 1/3 grade MMT (I.E 4- to 4+) and abdominal strength by >= 1 grade for improved lumbar stability and to assist low back in absorbing mechanical forces in weight bearing positions  for improved symptom response. Baseline: Hip Flex  R/L 4/4 , Hip Ext R/L 3/3, Hip Abd  R/L 4/4, Saharmann Level 3   01/07/24: Hip Flex R/L 4/4, Hip Ext (Supine) R/L 4-/4-, Hip Abd R/L 4/4, Sahrmann level 4  02/20/24: Hip Abd R/L 4-/4 , Hip Ex R/L 4+/4+   Goal status: ONGOING   3. Patient will be able to ambulate for 6 minutes without needing a standing or seated rest break due to pain and he will improve his distance by >=82 meters (269 ft) as evidence of an improved minimal detectable change or progress in aerobic endurance and lumbar function (Steffy and Seney 2008).  Baseline: 1,400 ft 01/09/24: 1,400 ft   Goal status: ONGOING     PLAN:  PT FREQUENCY: 1-2x/week  PT DURATION: 12 weeks  PLANNED INTERVENTIONS: 97164- PT Re-evaluation, 97750- Physical Performance Testing, 97110-Therapeutic exercises, 97530- Therapeutic activity, 97112- Neuromuscular re-education, 97535- Self Care, 02859- Manual therapy, [phone]- Gait training, [phone]- Aquatic Therapy, [phone]- Electrical stimulation (unattended), [phone]- Electrical stimulation (manual), [id number]- Traction (mechanical), 20560 (1-2 muscles), 20561 (3+ muscles)- Dry Needling, Patient/Family education, Balance training, Stair training, Taping, Joint mobilization, Joint manipulation, Spinal manipulation, Spinal mobilization, Vestibular training, DME instructions, Cryotherapy, and Moist heat.  PLAN FOR NEXT SESSION: Talk about exercise groups and transitioning to community exercise program.  Nu-Step with  emphasis on RPE and reaching moderate intensity. Standing hip abduction with sliders.    Toribio Servant PT, DPT  [facility] [address] [place], [address] Phone: [phone]   Fax:  [phone]

## 2024-02-28 ENCOUNTER — Ambulatory Visit: Attending: Neurology | Admitting: Physical Therapy

## 2024-02-28 DIAGNOSIS — R293 Abnormal posture: Secondary | ICD-10-CM | POA: Insufficient documentation

## 2024-02-28 DIAGNOSIS — M5459 Other low back pain: Secondary | ICD-10-CM | POA: Insufficient documentation

## 2024-02-28 DIAGNOSIS — R262 Difficulty in walking, not elsewhere classified: Secondary | ICD-10-CM | POA: Insufficient documentation

## 2024-02-28 NOTE — Therapy (Signed)
 OUTPATIENT PHYSICAL THERAPY THORACOLUMBAR TREATMENT    Patient Name: Daniel [name] MRN: [id number] DOB:29-Aug-1950, 73 y.o., male Today's Date: 02/28/2024  END OF SESSION:  PT End of Session - 02/28/24 1018     Visit Number 18    Number of Visits 24    Date for Recertification  04/14/24    Authorization Type [id number]    Authorization - Visit Number 18    Authorization - Number of Visits 24    Progress Note Due on Visit 20    PT Start Time 0945    PT Stop Time 1030    PT Time Calculation (min) 45 min    Activity Tolerance Patient tolerated treatment well    Behavior During Therapy [facility] for tasks assessed/performed               Past Medical History:  Diagnosis Date   Actinic keratosis    Allergy FROM CHILDHOOD   Arthritis    Asthma    COVID-19 01/03/2023   Eczema    GERD (gastroesophageal reflux disease)    Hx of dysplastic nevus 06/06/2006   Mid lower back, slight atypia   Hypothyroidism    Seasonal allergies    Thyroid  disease    Past Surgical History:  Procedure Laterality Date   CATARACT EXTRACTION     COLON SURGERY     COLONOSCOPY WITH PROPOFOL  N/A 11/18/2014   Procedure: COLONOSCOPY WITH PROPOFOL ;  Surgeon: Lamar ONEIDA Holmes, MD;  Location: [facility] ENDOSCOPY;  Service: Endoscopy;  Laterality: N/A;   COLONOSCOPY WITH PROPOFOL  N/A 12/13/2021   Procedure: COLONOSCOPY WITH PROPOFOL ;  Surgeon: Maryruth Ole ONEIDA, MD;  Location: [facility] ENDOSCOPY;  Service: Endoscopy;  Laterality: N/A;   EYE SURGERY     KNEE SURGERY     NASAL SEPTUM SURGERY     Patient Active Problem List   Diagnosis Date Noted   Parkinson's disease (HCC) 09/26/2022   Chronic idiopathic constipation 06/05/2022   Paroxysmal atrial fibrillation (HCC) 03/28/2022   Spondylolysis, lumbar region 03/28/2022   Avitaminosis D 03/28/2022   Myalgia due to statin 03/28/2022   Acquired thrombophilia 03/28/2022   Hyperlipidemia 03/28/2022   Abnormal gait 03/28/2022   Mild intermittent asthma without  complication 03/28/2022   Zenker's (hypopharyngeal) diverticulum 03/22/2018   Gastroesophageal reflux disease 03/22/2018   Fatty liver 10/09/2017   BPH (benign prostatic hyperplasia) 11/21/2014   Hypothyroidism 11/21/2014   Inguinal hernia 11/21/2014   OA (osteoarthritis) 11/21/2014   H/O adenomatous polyp of colon 10/14/2014    PCP: Dr. Jon Eva    REFERRING PROVIDER: Dr. Jannett Fairly   REFERRING DIAG: [id number] (ICD-10-CM) - DDD (degenerative disc disease), lumbar  Rationale for Evaluation and Treatment: Rehabilitation  THERAPY DIAG:  Other low back pain  Difficulty in walking, not elsewhere classified  Abnormal posture  ONSET DATE: 3+ years ago (around time of COVID)    SUBJECTIVE:  SUBJECTIVE STATEMENT: Pt reports that he felt great yesterday with minimal pain and nausea and he is not sure why he felt so much better. He describes feeling more nauseous on days that he does not eat breakfast.   PERTINENT HISTORY:  Pt reports that he started to experience low back pain a couple of years ago when he was walking during the time of COVID. Pain starts around the lower right hip and then spreads to his shoulder blades as the day goes on or the longer he is on his feet. He usually wears a back brace, which helps some. He is walking two blocks every day to build back to walking two miles per day. Patient was walking two miles a day for 5 out of 7 days per week and he was doing this up until this time of COVID. He feels that his balance is ok and that he has not had any falls in the past 6 months and he carries walking stick with him. He also has increased right knee pain that is due to OA and that he has received steroid joint injections which have helped in the past.   PAIN:  Are you having pain?  Yes: NPRS scale: 4-5/10 (currently when sitting)  Pain location:L3-L5 Central Spinous and radiates horizontally across low back  Pain description: Achy  Aggravating factors: Standing or walking for long periods of time   Relieving factors: Prednisone  for   PRECAUTIONS: None  RED FLAGS: None   WEIGHT BEARING RESTRICTIONS: No  FALLS:  Has patient fallen in last 6 months? No  LIVING ENVIRONMENT: Lives with: lives alone Lives in: House/apartment Stairs: Yes: External: 5 steps; on right going up, on left going up, and can reach both Has following equipment at home: Single point cane and wears life alert monitor    OCCUPATION: Retired, likes to Agricultural consultant at [employer]    PLOF: Independent  PATIENT GOALS: Control pain so that he can return to walking longer distances and standing for longer periods of time to cook again.   NEXT MD VISIT: 9 months; March 2026    OBJECTIVE:  Note: Objective measures were completed at Evaluation unless otherwise noted.  VITALS: BP 116/74 HR 66 SpO2 100%  DIAGNOSTIC FINDINGS:  CLINICAL DATA:  Low back pain, worse with standing. Symptoms over the last year.   EXAM: MRI LUMBAR SPINE WITHOUT CONTRAST   TECHNIQUE: Multiplanar, multisequence MR imaging of the lumbar spine was performed. No intravenous contrast was administered.   COMPARISON:  Radiography 11/30/2020   FINDINGS: Segmentation:  5 lumbar type vertebral bodies.   Alignment:  No malalignment.   Vertebrae: Benign appearing hemangioma within the T12 vertebral body. Discogenic endplate marrow changes throughout the lumbar region, with some active edema at L2-3, L3-4 and L4-5, which could correlate with localized back pain.   Conus medullaris and cauda equina: Conus extends to the L1-2 level. Conus and cauda equina appear normal.   Paraspinal and other soft tissues: Negative   Disc levels:   T11-12: Mild bulging of the disc.  No compressive stenosis.   T12-L1:  Unremarkable interspace.   L1-2: Shallow protrusion of the disc with a tiny amount caudally migrated disc material to the left of midline. Minimal indentation of the thecal sac. No compressive stenosis.   L2-3: Loss of disc height. Endplate osteophytes and bulging of the disc more prominent towards the left. Mild stenosis of the left lateral recess that could possibly cause left-sided neural compression. Discogenic endplate edema  which could relate to back pain.   L3-4: Loss of disc height. Endplate osteophytes and minimal bulging of the disc. Mild facet hypertrophy on the left. Mild left foraminal stenosis.   L4-5: Loss of disc height. Endplate osteophytes and bulging of the disc. Facet and ligamentous hypertrophy. Mild stenosis of the lateral recesses and foramina but without visible neural compression. Discogenic endplate edema which could relate to back pain   L5-S1: Disc desiccation with mild loss of height. Endplate osteophytes and mild bulging of the disc. No compressive narrowing of the canal or foramina.   IMPRESSION: Degenerative disc disease throughout the lumbar region with loss of disc height. No apparent compressive stenosis of the canal. Discogenic endplate marrow changes without active edema at L2-3, L3-4 and L4-5, which could correlate with regional axial back pain.   Left lateral recess and foraminal narrowing at L2-3, left foraminal narrowing at L3-4, and bilateral lateral recess and foraminal narrowing at L4-5. None of the stenoses are severe or are seen to result in definite neural compression.   Shallow left posterolateral disc herniation at L1-2 with caudal migration of a tiny amount of disc material to the left of midline. This does not appear to cause any significant compressive effect upon the neural structures.     Electronically Signed   By: Oneil Officer M.D.   On: 07/14/2021 23:01  PATIENT SURVEYS:  Modified Oswestry:  MODIFIED OSWESTRY  DISABILITY SCALE   Date: 11/27/23 Score Score    Pain intensity 3 =  Pain medication provides me with moderate relief from pain. 3  2. Personal care (washing, dressing, etc.) 1 =  I can take care of myself normally, but it increases my pain. 2  3. Lifting 3 = Pain prevents me from lifting heavy weights, but I can manage (5) I have hardly any social life because of my pain. light to medium weights if they are conveniently positioned 2  4. Walking 1 = Pain prevents me from walking more than 1 mile. 1  5. Sitting 2 =  Pain prevents me from sitting more than 1 hour. 0  6. Standing 2 =  Pain prevents me from standing more than 1 hour 3  7. Sleeping 0 = Pain does not prevent me from sleeping well. 0  8. Social Life 2 = Pain prevents me from participating in more energetic activities (eg. sports, dancing). 2  9. Traveling 1 =  I can travel anywhere, but it increases my pain. 1  10. Employment/ Homemaking 1 = My normal homemaking/job activities increase my pain, but I can still perform all that is required of me 1  Total 16/50= 32% 15/50 (30%)   Interpretation of scores: Score Category Description  0-20% Minimal Disability The patient can cope with most living activities. Usually no treatment is indicated apart from advice on lifting, sitting and exercise  21-40% Moderate Disability The patient experiences more pain and difficulty with sitting, lifting and standing. Travel and social life are more difficult and they may be disabled from work. Personal care, sexual activity and sleeping are not grossly affected, and the patient can usually be managed by conservative means  41-60% Severe Disability Pain remains the main problem in this group, but activities of daily living are affected. These patients require a detailed investigation  61-80% Crippled Back pain impinges on all aspects of the patient's life. Positive intervention is required  81-100% Bed-bound  These patients are either bed-bound or  exaggerating their symptoms  [place]  A, Booth A, et al. Surgery versus conservative management of stable thoracolumbar fracture: the PRESTO feasibility RCT. [place]): [employer]; 2021 Nov. [facility] Technology Assessment, No. 25.62.) Appendix 3, Oswestry Disability Index category descriptors. Available from: FindJewelers.cz  Minimally Clinically Important Difference (MCID) = 12.8%  COGNITION: Overall cognitive status: Within functional limits for tasks assessed     SENSATION: WFL  MUSCLE LENGTH: Hamstrings: Right 80 deg; Left 80 deg Ely's: Positive Bilateral   POSTURE: rounded shoulders and forward head  PALPATION: Right glute med    LUMBAR ROM:   AROM eval  Flexion 100%  Extension 75%*  Right lateral flexion 100%*  Left lateral flexion 100%  Right rotation 100%  Left rotation 100%   (Blank rows = not tested)  LOWER EXTREMITY ROM:     Active  Right eval Left eval  Hip flexion    Hip extension    Hip abduction    Hip adduction    Hip internal rotation    Hip external rotation    Knee flexion    Knee extension    Ankle dorsiflexion    Ankle plantarflexion    Ankle inversion    Ankle eversion     (Blank rows = not tested)  LOWER EXTREMITY MMT:    MMT Right eval Left eval Right  01/07/24 Left  01/07/24   Right   02/20/24 Left  02/20/24  Hip flexion 4 4 4 4  4+ 4+  Hip extension (supine)  3 3 4 4 4 4   Hip extension (standing with forward lean)     4+ 4+  Hip abduction 4 4 4 4  4- 4  Hip adduction 4 4      Hip internal rotation 4 4      Hip external rotation 4 4      Knee flexion 4 4      Knee extension 4 4      Ankle dorsiflexion 4+ 4+      Ankle plantarflexion        Ankle inversion        Ankle eversion         (Blank rows = not tested)  Sahrmann: Grade  4  LUMBAR SPECIAL TESTS:  Straight leg raise test: Negative, SI Compression/distraction test: Negative, FABER test: Positive Bilateral ,  and FADIR + RLE   FUNCTIONAL TESTS:  6 minute walk test: 1,400 ft  10 meter walk test: NT   GAIT:  Not assessed    TREATMENT DATE:   02/28/24: THERAC    Nu-Step Seat and arms  at 11 for 6 min for 10 min   -Use of song test to determine exercise intensity, so patient could experience and practice reaching appropriate level of intensity   Education about seeking further medical evaluation and treatment for ongoing pain management given the lack of improvement in this area.  -Provided local pain clinic resource: Dr. Wallie Sherry and to check in with Dr. Carlisle about his uncontrolled pain for further eval and treatment.   Education about [employer] in [place] and how this would be types of exercises well suited for patients with Parkinson's as well as social benefits of performing exercise in group.    [facility]    Education about about rate of perceived exertion and use of song test to correctly gauge exercise moderate intensity to reach recommendation 150 min per week and talk test to gauge vigorous.      Education about taking parkinson's medication after  eating breakfast to have buffer in stomach to avoid nausea, so that he is not limited in performing exercise.      PATIENT EDUCATION:  Education details: Form and technique for correct performance of exercise and explanation about directional pattern for movement Person educated: Patient Education method: Explanation, Demonstration, Verbal cues, and Handouts Education comprehension: verbalized understanding, returned demonstration, and verbal cues required  HOME EXERCISE PROGRAM:  Access Code: [email or web address] URL: [email or web address] Date: 02/25/2024 Prepared by: Toribio Servant  Program Notes Heel taps 3 x 10 x 3-4 days per week    Exercises - Seated Hamstring Stretch  - 1 x daily - 7 x weekly - 4 reps - 60 sec hold - Modified Thomas Stretch  - 1 x daily - 7 x weekly - 3 reps - 60 sec hold -  Modified Cat Cow on Counter  - 1 x daily - 7 x weekly - 2 sets - 10 reps - 3 sec hold - Supine Lower Trunk Rotation  - 1 x daily - 7 x weekly - 2 sets - 10 reps - 3 sec hold - Standing Right Body Stretch- Stretch to Left    - 1 x daily - 7 x weekly - 3 reps - 60 sec  hold - Prone Press Up On Elbows  - 1 x daily - 7 x weekly - 2 sets - 10 reps - 5 sec   hold - Prone Hip Extension on Table  - 3-4 x weekly - 3 sets - 10 reps - Sidelying Bent Knee Lift at 45 Degrees (Mirrored)  - 3-4 x weekly - 3 sets - 10 reps - Sidelying Bent Knee Lift at 45 Degrees  - 3-4 x weekly - 3 sets - 10 reps  ASSESSMENT:  CLINICAL IMPRESSION: Pt exhibits ongoing pain and nausea limitations due to physical activity. PT focused on providing extensive education on nutrition, seeking out additional medical resources and use of RPE scale to optimize physical fitness. Pt demonstrates understanding of ongoing plan for physical therapy and he expresses understanding of needed to likely seek further medical eval and treatment for ongoing chronic pain. Pt shows understanding of how to gauge exercise intensity using RPE scale. He is nearing end of his plan of care with PT focus of session on exercise education as well as shared decision making to improve patient understanding and self efficacy in preparation for discharge. He will continue benefit from continued skilled physical therapy services to decrease pain, improve strength and function.              OBJECTIVE IMPAIRMENTS: decreased knowledge of condition, difficulty walking, decreased ROM, decreased strength, hypomobility, impaired flexibility, postural dysfunction, and pain.   ACTIVITY LIMITATIONS: carrying, lifting, bending, sitting, standing, squatting, stairs, bathing, hygiene/grooming, and locomotion level  PARTICIPATION LIMITATIONS: cleaning, laundry, shopping, community activity, and yard work  PERSONAL FACTORS: Age, Time since onset of injury/illness/exacerbation, and  3+ comorbidities: Parkinson's disease, Hypothyroidism, h/o lumbar spondylosis   are also affecting patient's functional outcome.   REHAB POTENTIAL: Fair chronicity of condition and repeated past bouts of physical therapy    CLINICAL DECISION MAKING: Stable/uncomplicated  EVALUATION COMPLEXITY: Low   GOALS: Goals reviewed with patient? No  SHORT TERM GOALS: Target date: 12/11/2023  Patient will demonstrate undestanding of home exercise plan by performing exercises correctly with evidence of good carry over with min to no verbal or tactile cues .   Baseline: NT  12/10/23: Performing independently  Goal status: ACHIEVED      LONG TERM  GOALS: Target date: 04/14/2024  Patient will improve modified Oswestry Disability Index (MODI) score by >=13% as evidence of the minimal statistically significant change for improvement with low back pain disability and improvement in low back function (Copay et al, 2008) Baseline: 16/50 (32%) 01/07/24: 15/50 (30%) 02/25/24: 19/50 (38%) Goal status: ONGOING   2.  Patient will improve hip strength by 1/3 grade MMT (I.E 4- to 4+) and abdominal strength by >= 1 grade for improved lumbar stability and to assist low back in absorbing mechanical forces in weight bearing positions for improved symptom response. Baseline: Hip Flex  R/L 4/4 , Hip Ext R/L 3/3, Hip Abd  R/L 4/4, Saharmann Level 3   01/07/24: Hip Flex R/L 4/4, Hip Ext (Supine) R/L 4-/4-, Hip Abd R/L 4/4, Sahrmann level 4  02/20/24: Hip Abd R/L 4-/4 , Hip Ex R/L 4+/4+   Goal status: ONGOING   3. Patient will be able to ambulate for 6 minutes without needing a standing or seated rest break due to pain and he will improve his distance by >=82 meters (269 ft) as evidence of an improved minimal detectable change or progress in aerobic endurance and lumbar function (Steffy and Seney 2008).  Baseline: 1,400 ft 01/09/24: 1,400 ft   Goal status: ONGOING     PLAN:  PT FREQUENCY: 1-2x/week  PT DURATION: 12  weeks  PLANNED INTERVENTIONS: 97164- PT Re-evaluation, 97750- Physical Performance Testing, 97110-Therapeutic exercises, 97530- Therapeutic activity, 97112- Neuromuscular re-education, 97535- Self Care, 02859- Manual therapy, [phone]- Gait training, [phone]- Aquatic Therapy, [phone]- Electrical stimulation (unattended), [phone]- Electrical stimulation (manual), [id number]- Traction (mechanical), 20560 (1-2 muscles), 20561 (3+ muscles)- Dry Needling, Patient/Family education, Balance training, Stair training, Taping, Joint mobilization, Joint manipulation, Spinal manipulation, Spinal mobilization, Vestibular training, DME instructions, Cryotherapy, and Moist heat.  PLAN FOR NEXT SESSION: Attempt using TENS to decrease pain with activity. Review home exercise plan and have patient demonstrate exercises.  Standing hip abduction with sliders.   Toribio Servant PT, DPT  [facility] [address], [place], [address] Phone: [phone]   Fax:  [phone]

## 2024-03-02 ENCOUNTER — Other Ambulatory Visit: Payer: Self-pay | Admitting: Family Medicine

## 2024-03-03 ENCOUNTER — Ambulatory Visit: Admitting: Physical Therapy

## 2024-03-03 DIAGNOSIS — R262 Difficulty in walking, not elsewhere classified: Secondary | ICD-10-CM

## 2024-03-03 DIAGNOSIS — M5459 Other low back pain: Secondary | ICD-10-CM | POA: Diagnosis not present

## 2024-03-03 DIAGNOSIS — R293 Abnormal posture: Secondary | ICD-10-CM | POA: Diagnosis not present

## 2024-03-03 NOTE — Therapy (Addendum)
 OUTPATIENT PHYSICAL THERAPY THORACOLUMBAR TREATMENT    Patient Name: Daniel [name] MRN: [id number] DOB:1950/07/22, 73 y.o., male Today's Date: 03/03/2024  END OF SESSION:  PT End of Session - 03/03/24 1730     Visit Number 19    Number of Visits 24    Date for Recertification  04/14/24    Authorization Type [id number]    Authorization - Visit Number 19    Authorization - Number of Visits 24    Progress Note Due on Visit 20    PT Start Time 1645    PT Stop Time 1730    PT Time Calculation (min) 45 min    Activity Tolerance Patient tolerated treatment well    Behavior During Therapy [facility] for tasks assessed/performed                Past Medical History:  Diagnosis Date   Actinic keratosis    Allergy FROM CHILDHOOD   Arthritis    Asthma    COVID-19 01/03/2023   Eczema    GERD (gastroesophageal reflux disease)    Hx of dysplastic nevus 06/06/2006   Mid lower back, slight atypia   Hypothyroidism    Seasonal allergies    Thyroid  disease    Past Surgical History:  Procedure Laterality Date   CATARACT EXTRACTION     COLON SURGERY     COLONOSCOPY WITH PROPOFOL  N/A 11/18/2014   Procedure: COLONOSCOPY WITH PROPOFOL ;  Surgeon: Lamar ONEIDA Holmes, MD;  Location: [facility] ENDOSCOPY;  Service: Endoscopy;  Laterality: N/A;   COLONOSCOPY WITH PROPOFOL  N/A 12/13/2021   Procedure: COLONOSCOPY WITH PROPOFOL ;  Surgeon: Maryruth Ole ONEIDA, MD;  Location: [facility] ENDOSCOPY;  Service: Endoscopy;  Laterality: N/A;   EYE SURGERY     KNEE SURGERY     NASAL SEPTUM SURGERY     Patient Active Problem List   Diagnosis Date Noted   Parkinson's disease (HCC) 09/26/2022   Chronic idiopathic constipation 06/05/2022   Paroxysmal atrial fibrillation (HCC) 03/28/2022   Spondylolysis, lumbar region 03/28/2022   Avitaminosis D 03/28/2022   Myalgia due to statin 03/28/2022   Acquired thrombophilia 03/28/2022   Hyperlipidemia 03/28/2022   Abnormal gait 03/28/2022   Mild intermittent asthma without  complication 03/28/2022   Zenker's (hypopharyngeal) diverticulum 03/22/2018   Gastroesophageal reflux disease 03/22/2018   Fatty liver 10/09/2017   BPH (benign prostatic hyperplasia) 11/21/2014   Hypothyroidism 11/21/2014   Inguinal hernia 11/21/2014   OA (osteoarthritis) 11/21/2014   H/O adenomatous polyp of colon 10/14/2014    PCP: Dr. Jon Eva    REFERRING PROVIDER: Dr. Jannett Fairly   REFERRING DIAG: [id number] (ICD-10-CM) - DDD (degenerative disc disease), lumbar  Rationale for Evaluation and Treatment: Rehabilitation  THERAPY DIAG:  Other low back pain  Difficulty in walking, not elsewhere classified  ONSET DATE: 3+ years ago (around time of COVID)    SUBJECTIVE:  SUBJECTIVE STATEMENT: Pt reports having great days with little to no pain on Friday and Saturday but today has been difficult.    PERTINENT HISTORY:  Pt reports that he started to experience low back pain a couple of years ago when he was walking during the time of COVID. Pain starts around the lower right hip and then spreads to his shoulder blades as the day goes on or the longer he is on his feet. He usually wears a back brace, which helps some. He is walking two blocks every day to build back to walking two miles per day. Patient was walking two miles a day for 5 out of 7 days per week and he was doing this up until this time of COVID. He feels that his balance is ok and that he has not had any falls in the past 6 months and he carries walking stick with him. He also has increased right knee pain that is due to OA and that he has received steroid joint injections which have helped in the past.   PAIN:  Are you having pain? Yes: NPRS scale: 6/10 (when standing up from a seated position)  Pain location:L3-L5 Central Spinous and  radiates horizontally across low back  Pain description: Achy  Aggravating factors: Standing or walking for long periods of time   Relieving factors: Prednisone  for   PRECAUTIONS: None  RED FLAGS: None   WEIGHT BEARING RESTRICTIONS: No  FALLS:  Has patient fallen in last 6 months? No  LIVING ENVIRONMENT: Lives with: lives alone Lives in: House/apartment Stairs: Yes: External: 5 steps; on right going up, on left going up, and can reach both Has following equipment at home: Single point cane and wears life alert monitor    OCCUPATION: Retired, likes to Agricultural consultant at [employer]    PLOF: Independent  PATIENT GOALS: Control pain so that he can return to walking longer distances and standing for longer periods of time to cook again.   NEXT MD VISIT: 9 months; March 2026    OBJECTIVE:  Note: Objective measures were completed at Evaluation unless otherwise noted.  VITALS: BP 116/74 HR 66 SpO2 100%  DIAGNOSTIC FINDINGS:  CLINICAL DATA:  Low back pain, worse with standing. Symptoms over the last year.   EXAM: MRI LUMBAR SPINE WITHOUT CONTRAST   TECHNIQUE: Multiplanar, multisequence MR imaging of the lumbar spine was performed. No intravenous contrast was administered.   COMPARISON:  Radiography 11/30/2020   FINDINGS: Segmentation:  5 lumbar type vertebral bodies.   Alignment:  No malalignment.   Vertebrae: Benign appearing hemangioma within the T12 vertebral body. Discogenic endplate marrow changes throughout the lumbar region, with some active edema at L2-3, L3-4 and L4-5, which could correlate with localized back pain.   Conus medullaris and cauda equina: Conus extends to the L1-2 level. Conus and cauda equina appear normal.   Paraspinal and other soft tissues: Negative   Disc levels:   T11-12: Mild bulging of the disc.  No compressive stenosis.   T12-L1: Unremarkable interspace.   L1-2: Shallow protrusion of the disc with a tiny amount  caudally migrated disc material to the left of midline. Minimal indentation of the thecal sac. No compressive stenosis.   L2-3: Loss of disc height. Endplate osteophytes and bulging of the disc more prominent towards the left. Mild stenosis of the left lateral recess that could possibly cause left-sided neural compression. Discogenic endplate edema which could relate to back pain.   L3-4: Loss of disc  height. Endplate osteophytes and minimal bulging of the disc. Mild facet hypertrophy on the left. Mild left foraminal stenosis.   L4-5: Loss of disc height. Endplate osteophytes and bulging of the disc. Facet and ligamentous hypertrophy. Mild stenosis of the lateral recesses and foramina but without visible neural compression. Discogenic endplate edema which could relate to back pain   L5-S1: Disc desiccation with mild loss of height. Endplate osteophytes and mild bulging of the disc. No compressive narrowing of the canal or foramina.   IMPRESSION: Degenerative disc disease throughout the lumbar region with loss of disc height. No apparent compressive stenosis of the canal. Discogenic endplate marrow changes without active edema at L2-3, L3-4 and L4-5, which could correlate with regional axial back pain.   Left lateral recess and foraminal narrowing at L2-3, left foraminal narrowing at L3-4, and bilateral lateral recess and foraminal narrowing at L4-5. None of the stenoses are severe or are seen to result in definite neural compression.   Shallow left posterolateral disc herniation at L1-2 with caudal migration of a tiny amount of disc material to the left of midline. This does not appear to cause any significant compressive effect upon the neural structures.     Electronically Signed   By: Oneil Officer M.D.   On: 07/14/2021 23:01  PATIENT SURVEYS:  Modified Oswestry:  MODIFIED OSWESTRY DISABILITY SCALE   Date: 11/27/23 Score Score    Pain intensity 3 =  Pain medication  provides me with moderate relief from pain. 3  2. Personal care (washing, dressing, etc.) 1 =  I can take care of myself normally, but it increases my pain. 2  3. Lifting 3 = Pain prevents me from lifting heavy weights, but I can manage (5) I have hardly any social life because of my pain. light to medium weights if they are conveniently positioned 2  4. Walking 1 = Pain prevents me from walking more than 1 mile. 1  5. Sitting 2 =  Pain prevents me from sitting more than 1 hour. 0  6. Standing 2 =  Pain prevents me from standing more than 1 hour 3  7. Sleeping 0 = Pain does not prevent me from sleeping well. 0  8. Social Life 2 = Pain prevents me from participating in more energetic activities (eg. sports, dancing). 2  9. Traveling 1 =  I can travel anywhere, but it increases my pain. 1  10. Employment/ Homemaking 1 = My normal homemaking/job activities increase my pain, but I can still perform all that is required of me 1  Total 16/50= 32% 15/50 (30%)   Interpretation of scores: Score Category Description  0-20% Minimal Disability The patient can cope with most living activities. Usually no treatment is indicated apart from advice on lifting, sitting and exercise  21-40% Moderate Disability The patient experiences more pain and difficulty with sitting, lifting and standing. Travel and social life are more difficult and they may be disabled from work. Personal care, sexual activity and sleeping are not grossly affected, and the patient can usually be managed by conservative means  41-60% Severe Disability Pain remains the main problem in this group, but activities of daily living are affected. These patients require a detailed investigation  61-80% Crippled Back pain impinges on all aspects of the patient's life. Positive intervention is required  81-100% Bed-bound  These patients are either bed-bound or exaggerating their symptoms  Bluford BRAVO, [place] DELENA Karon DELENA, et al. Surgery versus  conservative management of stable thoracolumbar  fracture: the PRESTO feasibility RCT. [place]): [employer]; 2021 Nov. [facility] Technology Assessment, No. 25.62.) Appendix 3, Oswestry Disability Index category descriptors. Available from: FindJewelers.cz  Minimally Clinically Important Difference (MCID) = 12.8%  COGNITION: Overall cognitive status: Within functional limits for tasks assessed     SENSATION: WFL  MUSCLE LENGTH: Hamstrings: Right 80 deg; Left 80 deg Ely's: Positive Bilateral   POSTURE: rounded shoulders and forward head  PALPATION: Right glute med    LUMBAR ROM:   AROM eval  Flexion 100%  Extension 75%*  Right lateral flexion 100%*  Left lateral flexion 100%  Right rotation 100%  Left rotation 100%   (Blank rows = not tested)  LOWER EXTREMITY ROM:     Active  Right eval Left eval  Hip flexion    Hip extension    Hip abduction    Hip adduction    Hip internal rotation    Hip external rotation    Knee flexion    Knee extension    Ankle dorsiflexion    Ankle plantarflexion    Ankle inversion    Ankle eversion     (Blank rows = not tested)  LOWER EXTREMITY MMT:    MMT Right eval Left eval Right  01/07/24 Left  01/07/24   Right   02/20/24 Left  02/20/24  Hip flexion 4 4 4 4  4+ 4+  Hip extension (supine)  3 3 4 4 4 4   Hip extension (standing with forward lean)     4+ 4+  Hip abduction 4 4 4 4  4- 4  Hip adduction 4 4      Hip internal rotation 4 4      Hip external rotation 4 4      Knee flexion 4 4      Knee extension 4 4      Ankle dorsiflexion 4+ 4+      Ankle plantarflexion        Ankle inversion        Ankle eversion         (Blank rows = not tested)  Sahrmann: Grade  4  LUMBAR SPECIAL TESTS:  Straight leg raise test: Negative, SI Compression/distraction test: Negative, FABER test: Positive Bilateral , and FADIR + RLE   FUNCTIONAL TESTS:  6 minute walk test: 1,400 ft  10 meter walk  test: NT   GAIT:  Not assessed    TREATMENT DATE:   03/03/24: THERAC    Nu-Step Seat and arms  at 11 for 6 min  Use of TENS with portable unit set to 2   100 ft x 2 over ground ambulation    -NRPS 0/10  Sit to stand from 20 inch mat height 1 x 5 -Pt reports decreased pain in his low back and only pain is in right knee  Sit to Stand from 22 inch mat height 2 x 10    THEREX   Left Side Lying - Right Hip Abduction with knee flexed to 45 degrees 3 x 10  Standing RLE Hip Abduction with sliders and BUE support 1 x 10  Standing LLE Hip Abduction with sliders and BUE support 1 x 10  -Pt reports increased pain in left knee   Standing RLE Hip Abduction with no sliders and BUE support 1 x 10         PATIENT EDUCATION:  Education details: Form and technique for correct performance of exercise and explanation about directional pattern for movement Person educated: Patient Education  method: Explanation, Demonstration, Verbal cues, and Handouts Education comprehension: verbalized understanding, returned demonstration, and verbal cues required  HOME EXERCISE PROGRAM: Access Code: [email or web address] URL: [email or web address] Date: 03/03/2024 Prepared by: Toribio Servant  Program Notes Heel taps 3 x 10 x 3-4 days per week    Exercises - Seated Hamstring Stretch  - 1 x daily - 7 x weekly - 4 reps - 60 sec hold - Modified Thomas Stretch  - 1 x daily - 7 x weekly - 3 reps - 60 sec hold - Modified Cat Cow on Counter  - 1 x daily - 7 x weekly - 2 sets - 10 reps - 3 sec hold - Supine Lower Trunk Rotation  - 1 x daily - 7 x weekly - 2 sets - 10 reps - 3 sec hold - Standing Right Body Stretch- Stretch to Left    - 1 x daily - 7 x weekly - 3 reps - 60 sec  hold - Prone Press Up On Elbows  - 1 x daily - 7 x weekly - 2 sets - 10 reps - 5 sec   hold - Prone Hip Extension on Table  - 3-4 x weekly - 3 sets - 10 reps - Sidelying Bent Knee Lift at 45 Degrees (Mirrored)  - 3-4 x weekly - 3 sets -  10 reps - Sidelying Bent Knee Lift at 45 Degrees  - 3-4 x weekly - 3 sets - 10 reps  ASSESSMENT:  CLINICAL IMPRESSION: Pt shows marked improvement in decrease in pain with activity by using TENS. He was able to perform standing activities with decrease right sided low back pain and perform hip abduction with decrease right sided back pain. PT educated on how to use unit to reach out to his PCP to get an order, so insurance will cover it.  He will continue benefit from continued skilled physical therapy services to decrease pain, improve strength and function.       OBJECTIVE IMPAIRMENTS: decreased knowledge of condition, difficulty walking, decreased ROM, decreased strength, hypomobility, impaired flexibility, postural dysfunction, and pain.   ACTIVITY LIMITATIONS: carrying, lifting, bending, sitting, standing, squatting, stairs, bathing, hygiene/grooming, and locomotion level  PARTICIPATION LIMITATIONS: cleaning, laundry, shopping, community activity, and yard work  PERSONAL FACTORS: Age, Time since onset of injury/illness/exacerbation, and 3+ comorbidities: Parkinson's disease, Hypothyroidism, h/o lumbar spondylosis   are also affecting patient's functional outcome.   REHAB POTENTIAL: Fair chronicity of condition and repeated past bouts of physical therapy    CLINICAL DECISION MAKING: Stable/uncomplicated  EVALUATION COMPLEXITY: Low   GOALS: Goals reviewed with patient? No  SHORT TERM GOALS: Target date: 12/11/2023  Patient will demonstrate undestanding of home exercise plan by performing exercises correctly with evidence of good carry over with min to no verbal or tactile cues .   Baseline: NT  12/10/23: Performing independently  Goal status: ACHIEVED      LONG TERM GOALS: Target date: 04/14/2024  Patient will improve modified Oswestry Disability Index (MODI) score by >=13% as evidence of the minimal statistically significant change for improvement with low back pain disability  and improvement in low back function (Copay et al, 2008) Baseline: 16/50 (32%) 01/07/24: 15/50 (30%) 02/25/24: 19/50 (38%) Goal status: ONGOING   2.  Patient will improve hip strength by 1/3 grade MMT (I.E 4- to 4+) and abdominal strength by >= 1 grade for improved lumbar stability and to assist low back in absorbing mechanical forces in weight bearing positions for improved symptom response. Baseline: Hip Flex  R/L 4/4 , Hip Ext R/L 3/3, Hip Abd  R/L 4/4, Saharmann Level 3   01/07/24: Hip Flex R/L 4/4, Hip Ext (Supine) R/L 4-/4-, Hip Abd R/L 4/4, Sahrmann level 4  02/20/24: Hip Abd R/L 4-/4 , Hip Ex R/L 4+/4+   Goal status: ONGOING   3. Patient will be able to ambulate for 6 minutes without needing a standing or seated rest break due to pain and he will improve his distance by >=82 meters (269 ft) as evidence of an improved minimal detectable change or progress in aerobic endurance and lumbar function (Steffy and Seney 2008).  Baseline: 1,400 ft 01/09/24: 1,400 ft   Goal status: ONGOING     PLAN:  PT FREQUENCY: 1-2x/week  PT DURATION: 12 weeks  PLANNED INTERVENTIONS: 97164- PT Re-evaluation, 97750- Physical Performance Testing, 97110-Therapeutic exercises, 97530- Therapeutic activity, 97112- Neuromuscular re-education, 97535- Self Care, 02859- Manual therapy, [phone]- Gait training, [phone]- Aquatic Therapy, [phone]- Electrical stimulation (unattended), [phone]- Electrical stimulation (manual), [id number]- Traction (mechanical), 20560 (1-2 muscles), 20561 (3+ muscles)- Dry Needling, Patient/Family education, Balance training, Stair training, Taping, Joint mobilization, Joint manipulation, Spinal manipulation, Spinal mobilization, Vestibular training, DME instructions, Cryotherapy, and Moist heat.  PLAN FOR NEXT SESSION: Reassess long term goals. Finalize home exercise plan for discharge and remind about need for moderate level exercise using song test.    Toribio Servant PT, DPT  [facility] [address] [place], [address] Phone: [phone]   Fax:  [phone]

## 2024-03-04 ENCOUNTER — Ambulatory Visit: Admitting: Physical Therapy

## 2024-03-05 ENCOUNTER — Ambulatory Visit

## 2024-03-06 ENCOUNTER — Ambulatory Visit: Admitting: Physical Therapy

## 2024-03-06 DIAGNOSIS — R262 Difficulty in walking, not elsewhere classified: Secondary | ICD-10-CM

## 2024-03-06 DIAGNOSIS — R293 Abnormal posture: Secondary | ICD-10-CM | POA: Diagnosis not present

## 2024-03-06 DIAGNOSIS — M5459 Other low back pain: Secondary | ICD-10-CM | POA: Diagnosis not present

## 2024-03-06 NOTE — Therapy (Signed)
 OUTPATIENT PHYSICAL THERAPY THORACOLUMBAR DISCHARGE AND PROGRESS NOTE  Dates of Reporting:  01/07/24- 03/06/24    Patient Name: Daniel [name] MRN: [id number] DOB:1950-07-19, 73 y.o., male Today's Date: 03/06/2024  END OF SESSION:  PT End of Session - 03/06/24 0858     Visit Number 20    Number of Visits 24    Date for Recertification  04/14/24    Authorization Type [id number]    Authorization - Visit Number 20    Authorization - Number of Visits 24    Progress Note Due on Visit 20    PT Start Time 0900    PT Stop Time 0945    PT Time Calculation (min) 45 min    Activity Tolerance Patient tolerated treatment well    Behavior During Therapy [facility] for tasks assessed/performed                Past Medical History:  Diagnosis Date   Actinic keratosis    Allergy FROM CHILDHOOD   Arthritis    Asthma    COVID-19 01/03/2023   Eczema    GERD (gastroesophageal reflux disease)    Hx of dysplastic nevus 06/06/2006   Mid lower back, slight atypia   Hypothyroidism    Seasonal allergies    Thyroid  disease    Past Surgical History:  Procedure Laterality Date   CATARACT EXTRACTION     COLON SURGERY     COLONOSCOPY WITH PROPOFOL  N/A 11/18/2014   Procedure: COLONOSCOPY WITH PROPOFOL ;  Surgeon: Lamar ONEIDA Holmes, MD;  Location: [facility] ENDOSCOPY;  Service: Endoscopy;  Laterality: N/A;   COLONOSCOPY WITH PROPOFOL  N/A 12/13/2021   Procedure: COLONOSCOPY WITH PROPOFOL ;  Surgeon: Maryruth Ole ONEIDA, MD;  Location: [facility] ENDOSCOPY;  Service: Endoscopy;  Laterality: N/A;   EYE SURGERY     KNEE SURGERY     NASAL SEPTUM SURGERY     Patient Active Problem List   Diagnosis Date Noted   Parkinson's disease (HCC) 09/26/2022   Chronic idiopathic constipation 06/05/2022   Paroxysmal atrial fibrillation (HCC) 03/28/2022   Spondylolysis, lumbar region 03/28/2022   Avitaminosis D 03/28/2022   Myalgia due to statin 03/28/2022   Acquired thrombophilia 03/28/2022   Hyperlipidemia 03/28/2022    Abnormal gait 03/28/2022   Mild intermittent asthma without complication 03/28/2022   Zenker's (hypopharyngeal) diverticulum 03/22/2018   Gastroesophageal reflux disease 03/22/2018   Fatty liver 10/09/2017   BPH (benign prostatic hyperplasia) 11/21/2014   Hypothyroidism 11/21/2014   Inguinal hernia 11/21/2014   OA (osteoarthritis) 11/21/2014   H/O adenomatous polyp of colon 10/14/2014    PCP: Dr. Jon Eva    REFERRING PROVIDER: Dr. Jannett Fairly   REFERRING DIAG: [phone] (ICD-10-CM) - DDD (degenerative disc disease), lumbar  Rationale for Evaluation and Treatment: Rehabilitation  THERAPY DIAG:  Other low back pain  Difficulty in walking, not elsewhere classified  ONSET DATE: 3+ years ago (around time of COVID)    SUBJECTIVE:  SUBJECTIVE STATEMENT: Pt reports some pain this morning in right hip and that his ears feel full.    PERTINENT HISTORY:  Pt reports that he started to experience low back pain a couple of years ago when he was walking during the time of COVID. Pain starts around the lower right hip and then spreads to his shoulder blades as the day goes on or the longer he is on his feet. He usually wears a back brace, which helps some. He is walking two blocks every day to build back to walking two miles per day. Patient was walking two miles a day for 5 out of 7 days per week and he was doing this up until this time of COVID. He feels that his balance is ok and that he has not had any falls in the past 6 months and he carries walking stick with him. He also has increased right knee pain that is due to OA and that he has received steroid joint injections which have helped in the past.   PAIN:  Are you having pain? Yes: NPRS scale: 6/10 (when standing up from a seated position)  Pain  location:L3-L5 Central Spinous and radiates horizontally across low back  Pain description: Achy  Aggravating factors: Standing or walking for long periods of time   Relieving factors: Prednisone  for   PRECAUTIONS: None  RED FLAGS: None   WEIGHT BEARING RESTRICTIONS: No  FALLS:  Has patient fallen in last 6 months? No  LIVING ENVIRONMENT: Lives with: lives alone Lives in: House/apartment Stairs: Yes: External: 5 steps; on right going up, on left going up, and can reach both Has following equipment at home: Single point cane and wears life alert monitor    OCCUPATION: Retired, likes to Agricultural consultant at [employer]    PLOF: Independent  PATIENT GOALS: Control pain so that he can return to walking longer distances and standing for longer periods of time to cook again.   NEXT MD VISIT: 9 months; March 2026    OBJECTIVE:  Note: Objective measures were completed at Evaluation unless otherwise noted.  VITALS: BP 116/74 HR 66 SpO2 100%  DIAGNOSTIC FINDINGS:  CLINICAL DATA:  Low back pain, worse with standing. Symptoms over the last year.   EXAM: MRI LUMBAR SPINE WITHOUT CONTRAST   TECHNIQUE: Multiplanar, multisequence MR imaging of the lumbar spine was performed. No intravenous contrast was administered.   COMPARISON:  Radiography 11/30/2020   FINDINGS: Segmentation:  5 lumbar type vertebral bodies.   Alignment:  No malalignment.   Vertebrae: Benign appearing hemangioma within the T12 vertebral body. Discogenic endplate marrow changes throughout the lumbar region, with some active edema at L2-3, L3-4 and L4-5, which could correlate with localized back pain.   Conus medullaris and cauda equina: Conus extends to the L1-2 level. Conus and cauda equina appear normal.   Paraspinal and other soft tissues: Negative   Disc levels:   T11-12: Mild bulging of the disc.  No compressive stenosis.   T12-L1: Unremarkable interspace.   L1-2: Shallow protrusion of the  disc with a tiny amount caudally migrated disc material to the left of midline. Minimal indentation of the thecal sac. No compressive stenosis.   L2-3: Loss of disc height. Endplate osteophytes and bulging of the disc more prominent towards the left. Mild stenosis of the left lateral recess that could possibly cause left-sided neural compression. Discogenic endplate edema which could relate to back pain.   L3-4: Loss of disc height. Endplate osteophytes and  minimal bulging of the disc. Mild facet hypertrophy on the left. Mild left foraminal stenosis.   L4-5: Loss of disc height. Endplate osteophytes and bulging of the disc. Facet and ligamentous hypertrophy. Mild stenosis of the lateral recesses and foramina but without visible neural compression. Discogenic endplate edema which could relate to back pain   L5-S1: Disc desiccation with mild loss of height. Endplate osteophytes and mild bulging of the disc. No compressive narrowing of the canal or foramina.   IMPRESSION: Degenerative disc disease throughout the lumbar region with loss of disc height. No apparent compressive stenosis of the canal. Discogenic endplate marrow changes without active edema at L2-3, L3-4 and L4-5, which could correlate with regional axial back pain.   Left lateral recess and foraminal narrowing at L2-3, left foraminal narrowing at L3-4, and bilateral lateral recess and foraminal narrowing at L4-5. None of the stenoses are severe or are seen to result in definite neural compression.   Shallow left posterolateral disc herniation at L1-2 with caudal migration of a tiny amount of disc material to the left of midline. This does not appear to cause any significant compressive effect upon the neural structures.     Electronically Signed   By: Oneil Officer M.D.   On: 07/14/2021 23:01  PATIENT SURVEYS:  Modified Oswestry:  MODIFIED OSWESTRY DISABILITY SCALE   Date: 11/27/23 Score Score    Pain intensity  3 =  Pain medication provides me with moderate relief from pain. 3  2. Personal care (washing, dressing, etc.) 1 =  I can take care of myself normally, but it increases my pain. 2  3. Lifting 3 = Pain prevents me from lifting heavy weights, but I can manage (5) I have hardly any social life because of my pain. light to medium weights if they are conveniently positioned 2  4. Walking 1 = Pain prevents me from walking more than 1 mile. 1  5. Sitting 2 =  Pain prevents me from sitting more than 1 hour. 0  6. Standing 2 =  Pain prevents me from standing more than 1 hour 3  7. Sleeping 0 = Pain does not prevent me from sleeping well. 0  8. Social Life 2 = Pain prevents me from participating in more energetic activities (eg. sports, dancing). 2  9. Traveling 1 =  I can travel anywhere, but it increases my pain. 1  10. Employment/ Homemaking 1 = My normal homemaking/job activities increase my pain, but I can still perform all that is required of me 1  Total 16/50= 32% 15/50 (30%)   Interpretation of scores: Score Category Description  0-20% Minimal Disability The patient can cope with most living activities. Usually no treatment is indicated apart from advice on lifting, sitting and exercise  21-40% Moderate Disability The patient experiences more pain and difficulty with sitting, lifting and standing. Travel and social life are more difficult and they may be disabled from work. Personal care, sexual activity and sleeping are not grossly affected, and the patient can usually be managed by conservative means  41-60% Severe Disability Pain remains the main problem in this group, but activities of daily living are affected. These patients require a detailed investigation  61-80% Crippled Back pain impinges on all aspects of the patient's life. Positive intervention is required  81-100% Bed-bound  These patients are either bed-bound or exaggerating their symptoms  Bluford BRAVO, [place] DELENA Karon DELENA, et al.  Surgery versus conservative management of stable thoracolumbar fracture: the PRESTO feasibility  RCT. Ruben ([place]): NIHR Journals Library; 2021 Nov. [facility] Technology Assessment, No. 25.62.) Appendix 3, Oswestry Disability Index category descriptors. Available from: FindJewelers.cz  Minimally Clinically Important Difference (MCID) = 12.8%  COGNITION: Overall cognitive status: Within functional limits for tasks assessed     SENSATION: WFL  MUSCLE LENGTH: Hamstrings: Right 80 deg; Left 80 deg Ely's: Positive Bilateral   POSTURE: rounded shoulders and forward head  PALPATION: Right glute med    LUMBAR ROM:   AROM eval  Flexion 100%  Extension 75%*  Right lateral flexion 100%*  Left lateral flexion 100%  Right rotation 100%  Left rotation 100%   (Blank rows = not tested)  LOWER EXTREMITY ROM:     Active  Right eval Left eval  Hip flexion    Hip extension    Hip abduction    Hip adduction    Hip internal rotation    Hip external rotation    Knee flexion    Knee extension    Ankle dorsiflexion    Ankle plantarflexion    Ankle inversion    Ankle eversion     (Blank rows = not tested)  LOWER EXTREMITY MMT:    MMT Right eval Left eval Right  01/07/24 Left  01/07/24   Right   02/20/24 Left  02/20/24  Hip flexion 4 4 4 4  4+ 4+  Hip extension (supine)  3 3 4 4 4 4   Hip extension (standing with forward lean)     4+ 4+  Hip abduction 4 4 4 4  4- 4  Hip adduction 4 4      Hip internal rotation 4 4      Hip external rotation 4 4      Knee flexion 4 4      Knee extension 4 4      Ankle dorsiflexion 4+ 4+      Ankle plantarflexion        Ankle inversion        Ankle eversion         (Blank rows = not tested)  Sahrmann: Grade  4  LUMBAR SPECIAL TESTS:  Straight leg raise test: Negative, SI Compression/distraction test: Negative, FABER test: Positive Bilateral , and FADIR + RLE   FUNCTIONAL TESTS:  6 minute walk test: 1,400 ft   10 meter walk test: NT   GAIT:  Not assessed    TREATMENT DATE:   03/06/24 Physical Performance    :  1,300 ft   -Limited arm swing and forward lean   -No increase in pain in low back after performing     [facility]    Education about activity pacing with need to break up task to avoid exacerbation of pain.   THEREX   Explanation about exercise progressions especially for resistance exercises   Supine Shoulder IR/ER AROM with shoulder at 90 deg  2 x 10   Supported standing hip extension on forearms 1 x 10  Supported standing hip extension on forearms with red band  1 x 10  Support standing hip extension on forearms with green band 1 x 10    -mod VC to bring LLE back to same place as RLE to increase range of motion that hip is being resisted against.  Left Side Lying Right Hip Abduction with flexed knee 2 x 10   Right Side Lying Left Hip Abduction with flexed knee 2 x 10   Prone Thoracic Extension with forearm push up 2 x 10  -mod VC  to hold extension longer and to look upwards for neck flexor stretch   Lower Trunk Rotation 2 x 10  with 2 sec hold      PATIENT EDUCATION:  Education details: Form and technique for correct performance of exercise and explanation about directional pattern for movement Person educated: Patient Education method: Explanation, Demonstration, Verbal cues, and Handouts Education comprehension: verbalized understanding, returned demonstration, and verbal cues required  HOME EXERCISE PROGRAM: Access Code: [email or web address] URL: [email or web address] Date: 03/03/2024 Prepared by: Toribio Servant  Program Notes Heel taps 3 x 10 x 3-4 days per week    Exercises - Seated Hamstring Stretch  - 1 x daily - 7 x weekly - 4 reps - 60 sec hold - Modified Thomas Stretch  - 1 x daily - 7 x weekly - 3 reps - 60 sec hold - Modified Cat Cow on Counter  - 1 x daily - 7 x weekly - 2 sets - 10 reps - 3 sec hold - Supine Lower Trunk Rotation  - 1 x daily - 7 x  weekly - 2 sets - 10 reps - 3 sec hold - Standing Right Body Stretch- Stretch to Left    - 1 x daily - 7 x weekly - 3 reps - 60 sec  hold - Prone Press Up On Elbows  - 1 x daily - 7 x weekly - 2 sets - 10 reps - 5 sec   hold - Prone Hip Extension on Table  - 3-4 x weekly - 3 sets - 10 reps - Sidelying Bent Knee Lift at 45 Degrees (Mirrored)  - 3-4 x weekly - 3 sets - 10 reps - Sidelying Bent Knee Lift at 45 Degrees  - 3-4 x weekly - 3 sets - 10 reps  ASSESSMENT:  CLINICAL IMPRESSION: Despite partially meeting most of his rehab goals, pt shows improvement in function with ability to perform physical activity with decreased pain. PT recommends that pt is now ready for discharge and that he can continue peforming home exercises independently to maintain functional gains. PT educated pt on need for activity pacing to avoid pain exacerbation and that he would never be pain free when performing physical activity. PT also recommends that pt return in 2-3 months if he is having trouble completing home exercise plan or if he returns to be limited by pain. Pt is in agreement with this plan. He will continue benefit from continued skilled physical therapy services to decrease pain, improve strength and function.       OBJECTIVE IMPAIRMENTS: decreased knowledge of condition, difficulty walking, decreased ROM, decreased strength, hypomobility, impaired flexibility, postural dysfunction, and pain.   ACTIVITY LIMITATIONS: carrying, lifting, bending, sitting, standing, squatting, stairs, bathing, hygiene/grooming, and locomotion level  PARTICIPATION LIMITATIONS: cleaning, laundry, shopping, community activity, and yard work  PERSONAL FACTORS: Age, Time since onset of injury/illness/exacerbation, and 3+ comorbidities: Parkinson's disease, Hypothyroidism, h/o lumbar spondylosis   are also affecting patient's functional outcome.   REHAB POTENTIAL: Fair chronicity of condition and repeated past bouts of physical  therapy    CLINICAL DECISION MAKING: Stable/uncomplicated  EVALUATION COMPLEXITY: Low   GOALS: Goals reviewed with patient? No  SHORT TERM GOALS: Target date: 12/11/2023  Patient will demonstrate undestanding of home exercise plan by performing exercises correctly with evidence of good carry over with min to no verbal or tactile cues .   Baseline: NT  12/10/23: Performing independently  Goal status: ACHIEVED      LONG TERM GOALS: Target date: 04/14/2024  Patient  will improve modified Oswestry Disability Index (MODI) score by >=13% as evidence of the minimal statistically significant change for improvement with low back pain disability and improvement in low back function (Copay et al, 2008) Baseline: 16/50 (32%) 01/07/24: 15/50 (30%) 02/25/24: 19/50 (38%) Goal status: PARTIALLY MET    2.  Patient will improve hip strength by 1/3 grade MMT (I.E 4- to 4+) and abdominal strength by >= 1 grade for improved lumbar stability and to assist low back in absorbing mechanical forces in weight bearing positions for improved symptom response. Baseline: Hip Flex  R/L 4/4 , Hip Ext R/L 3/3, Hip Abd  R/L 4/4, Saharmann Level 3   01/07/24: Hip Flex R/L 4/4, Hip Ext (Supine) R/L 4-/4-, Hip Abd R/L 4/4, Sahrmann level 4  02/20/24: Hip Abd R/L 4-/4 , Hip Ex R/L 4+/4+   Goal status: PARTIALLY MET    3. Patient will be able to ambulate for 6 minutes without needing a standing or seated rest break due to pain and he will improve his distance by >=82 meters (269 ft) as evidence of an improved minimal detectable change or progress in aerobic endurance and lumbar function (Steffy and Seney 2008).  Baseline: 1,400 ft 01/09/24: 1,400 ft  03/06/24: 1,300 ft   Goal status: PARTIALLY MET      PLAN:  PT FREQUENCY: 1-2x/week  PT DURATION: 12 weeks  PLANNED INTERVENTIONS: 97164- PT Re-evaluation, 97750- Physical Performance Testing, 97110-Therapeutic exercises, 97530- Therapeutic activity, 97112- Neuromuscular  re-education, 97535- Self Care, 02859- Manual therapy, [phone]- Gait training, [phone]- Aquatic Therapy, [phone]- Electrical stimulation (unattended), [phone]- Electrical stimulation (manual), [id number]- Traction (mechanical), 20560 (1-2 muscles), 20561 (3+ muscles)- Dry Needling, Patient/Family education, Balance training, Stair training, Taping, Joint mobilization, Joint manipulation, Spinal manipulation, Spinal mobilization, Vestibular training, DME instructions, Cryotherapy, and Moist heat.  PLAN FOR NEXT SESSION: Discharge from PT    Toribio Servant PT, DPT  [facility] [address], [place], [address] Phone: [phone]   Fax:  [phone]

## 2024-03-10 DIAGNOSIS — Z961 Presence of intraocular lens: Secondary | ICD-10-CM | POA: Diagnosis not present

## 2024-03-11 ENCOUNTER — Ambulatory Visit: Admitting: Physical Therapy

## 2024-03-13 ENCOUNTER — Ambulatory Visit: Admitting: Physical Therapy

## 2024-03-14 DIAGNOSIS — E538 Deficiency of other specified B group vitamins: Secondary | ICD-10-CM | POA: Diagnosis not present

## 2024-03-18 ENCOUNTER — Encounter: Admitting: Physical Therapy

## 2024-03-20 ENCOUNTER — Telehealth: Payer: Self-pay | Admitting: Family Medicine

## 2024-03-20 NOTE — Telephone Encounter (Signed)
[facility] faxed refill request for the following medications:   pantoprazole (PROTONIX) 40 MG tablet    Please advise.  

## 2024-03-24 ENCOUNTER — Encounter: Admitting: Physical Therapy

## 2024-03-25 ENCOUNTER — Other Ambulatory Visit: Payer: Self-pay

## 2024-03-25 DIAGNOSIS — K219 Gastro-esophageal reflux disease without esophagitis: Secondary | ICD-10-CM

## 2024-03-25 MED ORDER — PANTOPRAZOLE SODIUM 40 MG PO TBEC: 40.0000 mg | DELAYED_RELEASE_TABLET | Freq: Every day | ORAL | 3 refills | Status: AC

## 2024-03-27 ENCOUNTER — Encounter: Admitting: Physical Therapy

## 2024-04-01 ENCOUNTER — Encounter: Admitting: Physical Therapy

## 2024-04-03 ENCOUNTER — Ambulatory Visit: Admitting: Family Medicine

## 2024-04-07 ENCOUNTER — Encounter: Admitting: Physical Therapy

## 2024-04-08 ENCOUNTER — Encounter: Payer: Self-pay | Admitting: Family Medicine

## 2024-04-08 ENCOUNTER — Ambulatory Visit (INDEPENDENT_AMBULATORY_CARE_PROVIDER_SITE_OTHER): Admitting: Family Medicine

## 2024-04-08 VITALS — BP 129/88 | HR 66 | Ht 74.0 in | Wt 216.2 lb

## 2024-04-08 DIAGNOSIS — E559 Vitamin D deficiency, unspecified: Secondary | ICD-10-CM

## 2024-04-08 DIAGNOSIS — I480 Paroxysmal atrial fibrillation: Secondary | ICD-10-CM | POA: Diagnosis not present

## 2024-04-08 DIAGNOSIS — G20B2 Parkinson's disease with dyskinesia, with fluctuations: Secondary | ICD-10-CM | POA: Diagnosis not present

## 2024-04-08 DIAGNOSIS — M791 Myalgia, unspecified site: Secondary | ICD-10-CM

## 2024-04-08 DIAGNOSIS — M545 Low back pain, unspecified: Secondary | ICD-10-CM

## 2024-04-08 DIAGNOSIS — G8929 Other chronic pain: Secondary | ICD-10-CM

## 2024-04-08 DIAGNOSIS — D6869 Other thrombophilia: Secondary | ICD-10-CM

## 2024-04-08 DIAGNOSIS — E039 Hypothyroidism, unspecified: Secondary | ICD-10-CM

## 2024-04-08 DIAGNOSIS — M1710 Unilateral primary osteoarthritis, unspecified knee: Secondary | ICD-10-CM

## 2024-04-08 DIAGNOSIS — E785 Hyperlipidemia, unspecified: Secondary | ICD-10-CM

## 2024-04-08 NOTE — Assessment & Plan Note (Signed)
A-fib on anticoagulation 

## 2024-04-08 NOTE — Assessment & Plan Note (Signed)
 Paroxysmal atrial fibrillation managed with Eliquis and metoprolol. Recent echocardiogram showed normal heart function. No acute issues reported. F/b Cardiology - Continue Eliquis 5 mg BID and metoprolol XL 25 mg daily.

## 2024-04-08 NOTE — Assessment & Plan Note (Signed)
 Managed with Synthroid . No acute issues reported. - Ordered thyroid  function tests to assess current thyroid  status.

## 2024-04-08 NOTE — Assessment & Plan Note (Signed)
 Hyperlipidemia with intolerance to statins due to myalgia. Previous trial of Zetia  was unsuccessful. Cholesterol levels mildly elevated but not critically high. Discussed potential use of PCSK9 inhibitors like Repatha if cholesterol levels become significantly elevated. - Ordered lipid panel to assess current cholesterol levels. - Will consider PCSK9 inhibitor therapy if cholesterol levels are significantly elevated.

## 2024-04-08 NOTE — Assessment & Plan Note (Signed)
 Continue supplement Recheck level

## 2024-04-08 NOTE — Progress Notes (Signed)
 Established patient visit   Patient: Daniel [name]   DOB: 1950/09/16   73 y.o. Male  MRN: [id number] Visit Date: 04/08/2024  Today's healthcare provider: Jon Eva, MD   Chief Complaint  Patient presents with   Medical Management of Chronic Issues    Patient reports taking medications as prescribed with no symptoms to report. He does not monitor his blood pressure at home and is consuming a regular diet   Hypertension   Hypothyroidism   Subjective    Hypertension   HPI     Medical Management of Chronic Issues    Additional comments: Patient reports taking medications as prescribed with no symptoms to report. He does not monitor his blood pressure at home and is consuming a regular diet      Last edited by Lilian Fitzpatrick, CMA on 04/08/2024  2:47 PM.       Discussed the use of AI scribe software for clinical note transcription with the patient, who gave verbal consent to proceed.  History of Present Illness   Daniel [name] is a 73 year old male with hyperlipidemia, Parkinson's disease, hypothyroidism, and paroxysmal atrial fibrillation who presents for chronic follow-up.  He experiences myalgia from statin use and is not currently on any statins due to joint pain. Dietary changes include increased fish and chicken intake with reduced red meat consumption. Cholesterol levels have been mildly elevated.  Parkinson's disease affects his mobility, and he is undergoing physical therapy. He is concerned about progression impacting mobility and cognitive function. Symptoms have included acting out dreams, which has ceased.  He takes Synthroid  100 mcg daily for hypothyroidism with routine monitoring of thyroid  levels.  Paroxysmal atrial fibrillation is managed with Eliquis 5 mg twice daily and metoprolol XL 25 mg daily. A recent echocardiogram showed normal cardiac function.  He experiences back pain without relief from tramadol and is considering further  evaluation at a pain clinic. His knee is 'bone on bone'. No current swelling, but occasional sensation of foot swelling.         Medications: Outpatient Medications Prior to Visit  Medication Sig   carbidopa-levodopa (PARCOPA) 25-100 MG disintegrating tablet Take 1 tablet by mouth 3 (three) times daily.   Cholecalciferol (VITAMIN D ) 125 MCG (5000 UT) CAPS Take 5,000 Units by mouth daily.   cyanocobalamin  (VITAMIN B12) 1000 MCG tablet Take 1,000 mcg by mouth daily.   docusate sodium (COLACE) 100 MG capsule Take 200 mg by mouth at bedtime.   ELIQUIS 5 MG TABS tablet Take 5 mg by mouth 2 (two) times daily.   fluticasone  (FLONASE ) 50 MCG/ACT nasal spray Place 2 sprays into both nostrils daily.   levothyroxine  (SYNTHROID ) 100 MCG tablet TAKE 1 TABLET BY MOUTH EVERY DAY BEFORE BREAKFAST   metoprolol succinate (TOPROL-XL) 25 MG 24 hr tablet Take 25 mg by mouth daily.   ondansetron  (ZOFRAN ) 4 MG tablet TAKE 1 TABLET(4 MG) BY MOUTH EVERY 8 HOURS AS NEEDED FOR NAUSEA OR VOMITING   pantoprazole  (PROTONIX ) 40 MG tablet Take 1 tablet (40 mg total) by mouth daily.   predniSONE  (DELTASONE ) 20 MG tablet Take 1 tablet (20 mg total) by mouth daily with breakfast.   traMADol (ULTRAM) 50 MG tablet Take 75 mg by mouth 2 (two) times daily.   triamcinolone  cream (KENALOG ) 0.1 % Apply 1 Application topically as directed. Qd up to 5 days per week to aa rash back until clear, avoid face, groin, axilla   No facility-administered medications prior  to visit.    Review of Systems     Objective    BP 129/88 (BP Location: Left Arm, Patient Position: Sitting, Cuff Size: Normal)   Pulse 66   Ht 6' 2 (1.88 m)   Wt 216 lb 3.2 oz (98.1 kg)   SpO2 98%   BMI 27.76 kg/m    Physical Exam Vitals reviewed.  Constitutional:      General: He is not in acute distress.    Appearance: Normal appearance. He is not diaphoretic.  HENT:     Head: Normocephalic and atraumatic.  Eyes:     General: No scleral icterus.     Conjunctiva/sclera: Conjunctivae normal.  Cardiovascular:     Rate and Rhythm: Normal rate and regular rhythm.     Heart sounds: Normal heart sounds. No murmur heard. Pulmonary:     Effort: Pulmonary effort is normal. No respiratory distress.     Breath sounds: Normal breath sounds. No wheezing or rhonchi.  Musculoskeletal:     Cervical back: Neck supple.     Right lower leg: No edema.     Left lower leg: No edema.  Lymphadenopathy:     Cervical: No cervical adenopathy.  Skin:    General: Skin is warm and dry.     Findings: No rash.  Neurological:     Mental Status: He is alert and oriented to person, place, and time. Mental status is at baseline.  Psychiatric:        Mood and Affect: Mood normal.        Behavior: Behavior normal.      No results found for any visits on 04/08/24.  Assessment & Plan     Problem List Items Addressed This Visit       Cardiovascular and Mediastinum   Paroxysmal atrial fibrillation (HCC)   Paroxysmal atrial fibrillation managed with Eliquis and metoprolol. Recent echocardiogram showed normal heart function. No acute issues reported. F/b Cardiology - Continue Eliquis 5 mg BID and metoprolol XL 25 mg daily.      Relevant Orders   CBC with Differential/Platelet   Comprehensive metabolic panel with GFR     Endocrine   Hypothyroidism - Primary   Managed with Synthroid . No acute issues reported. - Ordered thyroid  function tests to assess current thyroid  status.      Relevant Orders   TSH     Nervous and Auditory   Parkinson's disease (HCC)   Parkinson's disease with slow progression over decades. Concerns about future progression and potential impact on mobility and cognition. No significant memory loss attributed to Parkinson's. Current management includes neurology follow-up and physical therapy. - Continue current management with neurology and physical therapy.        Musculoskeletal and Integument   OA (osteoarthritis)      Hematopoietic and Hemostatic   Acquired thrombophilia   A-fib on anticoagulation      Relevant Orders   CBC with Differential/Platelet     Other   Avitaminosis D   Continue supplement Recheck level       Relevant Orders   VITAMIN D  25 Hydroxy (Vit-D Deficiency, Fractures)   Myalgia due to statin   Hyperlipidemia   Hyperlipidemia with intolerance to statins due to myalgia. Previous trial of Zetia  was unsuccessful. Cholesterol levels mildly elevated but not critically high. Discussed potential use of PCSK9 inhibitors like Repatha if cholesterol levels become significantly elevated. - Ordered lipid panel to assess current cholesterol levels. - Will consider PCSK9 inhibitor therapy if cholesterol levels  are significantly elevated.      Relevant Orders   Comprehensive metabolic panel with GFR   Lipid panel   Other Visit Diagnoses       Chronic low back pain without sciatica, unspecified back pain laterality               Chronic low back pain Limited relief from current treatments. Previous physiatry consultation was unhelpful. Considering referral to a pain clinic for further evaluation and management. - Will consider referral to a pain clinic for further evaluation and management.  Knee osteoarthritis Bone-on-bone changes. Considering further interventions such as gel injections. - Consulted with orthopedic specialist regarding potential gel injections for knee osteoarthritis.  General Health Maintenance Routine health maintenance discussed, including dietary modifications to improve cholesterol levels. - Encouraged dietary modifications to include more fish and chicken, and less red meat.       Return in about 6 months (around 10/06/2024) for CPE.       Jon Eva, MD  [facility] [phone] (phone) [phone] (fax)  [facility]

## 2024-04-08 NOTE — Assessment & Plan Note (Signed)
 Parkinson's disease with slow progression over decades. Concerns about future progression and potential impact on mobility and cognition. No significant memory loss attributed to Parkinson's. Current management includes neurology follow-up and physical therapy. - Continue current management with neurology and physical therapy.

## 2024-04-09 ENCOUNTER — Encounter: Admitting: Physical Therapy

## 2024-04-09 LAB — CBC WITH DIFFERENTIAL/PLATELET
Basophils Absolute: 0.1 x10E3/uL (ref 0.0–0.2)
Basos: 1 %
EOS (ABSOLUTE): 0.3 x10E3/uL (ref 0.0–0.4)
Eos: 5 %
Hematocrit: 46.5 % (ref 37.5–51.0)
Hemoglobin: 15.6 g/dL (ref 13.0–17.7)
Immature Grans (Abs): 0 x10E3/uL (ref 0.0–0.1)
Immature Granulocytes: 0 %
Lymphocytes Absolute: 1.3 x10E3/uL (ref 0.7–3.1)
Lymphs: 19 %
MCH: 29.8 pg (ref 26.6–33.0)
MCHC: 33.5 g/dL (ref 31.5–35.7)
MCV: 89 fL (ref 79–97)
Monocytes Absolute: 0.7 x10E3/uL (ref 0.1–0.9)
Monocytes: 10 %
Neutrophils Absolute: 4.4 x10E3/uL (ref 1.4–7.0)
Neutrophils: 65 %
Platelets: 230 x10E3/uL (ref 150–450)
RBC: 5.24 x10E6/uL (ref 4.14–5.80)
RDW: 13.2 % (ref 11.6–15.4)
WBC: 6.8 x10E3/uL (ref 3.4–10.8)

## 2024-04-09 LAB — COMPREHENSIVE METABOLIC PANEL WITH GFR
ALT: 10 IU/L (ref 0–44)
AST: 14 IU/L (ref 0–40)
Albumin: 4.2 g/dL (ref 3.8–4.8)
Alkaline Phosphatase: 80 IU/L (ref 47–123)
BUN/Creatinine Ratio: 17 (ref 10–24)
BUN: 19 mg/dL (ref 8–27)
Bilirubin Total: 0.5 mg/dL (ref 0.0–1.2)
CO2: 25 mmol/L (ref 20–29)
Calcium: 9.2 mg/dL (ref 8.6–10.2)
Chloride: 104 mmol/L (ref 96–106)
Creatinine, Ser: 1.1 mg/dL (ref 0.76–1.27)
Globulin, Total: 2.1 g/dL (ref 1.5–4.5)
Glucose: 86 mg/dL (ref 70–99)
Potassium: 4.1 mmol/L (ref 3.5–5.2)
Sodium: 144 mmol/L (ref 134–144)
Total Protein: 6.3 g/dL (ref 6.0–8.5)
eGFR: 70 mL/min/1.73 (ref >59)

## 2024-04-09 LAB — LIPID PANEL
Chol/HDL Ratio: 3.9 ratio (ref 0.0–5.0)
Cholesterol, Total: 195 mg/dL (ref 100–199)
HDL: 50 mg/dL (ref >39)
LDL Chol Calc (NIH): 105 mg/dL — ABNORMAL HIGH (ref 0–99)
Triglycerides: 231 mg/dL — ABNORMAL HIGH (ref 0–149)
VLDL Cholesterol Cal: 40 mg/dL (ref 5–40)

## 2024-04-09 LAB — VITAMIN D 25 HYDROXY (VIT D DEFICIENCY, FRACTURES): Vit D, 25-Hydroxy: 30.1 ng/mL (ref 30.0–100.0)

## 2024-04-09 LAB — TSH: TSH: 1.92 u[IU]/mL (ref 0.450–4.500)

## 2024-04-10 ENCOUNTER — Ambulatory Visit: Payer: Self-pay | Admitting: Family Medicine

## 2024-04-21 DIAGNOSIS — E538 Deficiency of other specified B group vitamins: Secondary | ICD-10-CM | POA: Diagnosis not present

## 2024-04-30 ENCOUNTER — Encounter: Payer: Self-pay | Admitting: Family Medicine

## 2024-04-30 DIAGNOSIS — M1711 Unilateral primary osteoarthritis, right knee: Secondary | ICD-10-CM | POA: Diagnosis not present

## 2024-05-05 MED ORDER — PREDNISONE 20 MG PO TABS: 20.0000 mg | ORAL_TABLET | Freq: Every day | ORAL | 0 refills | Status: AC

## 2024-05-13 ENCOUNTER — Ambulatory Visit: Admitting: Dermatology

## 2024-05-15 ENCOUNTER — Ambulatory Visit: Payer: Medicare Other | Admitting: Dermatology

## 2024-05-28 ENCOUNTER — Other Ambulatory Visit: Payer: Self-pay | Admitting: Family Medicine

## 2024-06-10 ENCOUNTER — Other Ambulatory Visit

## 2024-06-10 ENCOUNTER — Other Ambulatory Visit: Payer: Self-pay | Admitting: Family Medicine

## 2024-06-10 ENCOUNTER — Ambulatory Visit
Admission: RE | Admit: 2024-06-10 | Discharge: 2024-06-10 | Disposition: A | Source: Ambulatory Visit | Attending: Family Medicine | Admitting: Family Medicine

## 2024-06-10 DIAGNOSIS — M5416 Radiculopathy, lumbar region: Secondary | ICD-10-CM

## 2024-06-12 ENCOUNTER — Ambulatory Visit: Admitting: Dermatology

## 2024-06-12 ENCOUNTER — Encounter: Payer: Self-pay | Admitting: Dermatology

## 2024-06-12 DIAGNOSIS — W908XXA Exposure to other nonionizing radiation, initial encounter: Secondary | ICD-10-CM | POA: Diagnosis not present

## 2024-06-12 DIAGNOSIS — L821 Other seborrheic keratosis: Secondary | ICD-10-CM

## 2024-06-12 DIAGNOSIS — L720 Epidermal cyst: Secondary | ICD-10-CM | POA: Diagnosis not present

## 2024-06-12 DIAGNOSIS — L570 Actinic keratosis: Secondary | ICD-10-CM | POA: Diagnosis not present

## 2024-06-12 DIAGNOSIS — L578 Other skin changes due to chronic exposure to nonionizing radiation: Secondary | ICD-10-CM

## 2024-06-12 DIAGNOSIS — L820 Inflamed seborrheic keratosis: Secondary | ICD-10-CM | POA: Diagnosis not present

## 2024-06-12 DIAGNOSIS — D229 Melanocytic nevi, unspecified: Secondary | ICD-10-CM

## 2024-06-12 DIAGNOSIS — L729 Follicular cyst of the skin and subcutaneous tissue, unspecified: Secondary | ICD-10-CM

## 2024-06-12 NOTE — Progress Notes (Signed)
 "  Follow-Up Visit   Subjective  Daniel [name] is a 74 y.o. male who presents for the following: dark spot at left temple / forehead   Hx of isks  The patient has spots, moles and lesions to be evaluated, some may be new or changing and the patient may have concern these could be cancer.  The following portions of the chart were reviewed this encounter and updated as appropriate: medications, allergies, medical history  Review of Systems:  No other skin or systemic complaints except as noted in HPI or Assessment and Plan.  Objective  Well appearing patient in no apparent distress; mood and affect are within normal limits.  A focused examination was performed of the following areas: Face, scalp, hands  Relevant exam findings are noted in the Assessment and Plan.  left temple x 3, right preauricular x 1, right temple x 2 (6) Erythematous stuck-on, waxy papule or plaque left cheek x 1 Erythematous thin papules/macules with gritty scale.   Assessment & Plan    ACTINIC DAMAGE - chronic, secondary to cumulative UV radiation exposure/sun exposure over time - diffuse scaly erythematous macules with underlying dyspigmentation - Recommend daily broad spectrum sunscreen SPF 30+ to sun-exposed areas, reapply every 2 hours as needed.  - Recommend staying in the shade or wearing long sleeves, sun glasses (UVA+UVB protection) and wide brim hats (4-inch brim around the entire circumference of the hat). - Call for new or changing lesions.  SEBORRHEIC KERATOSIS - Stuck-on, waxy, tan-brown papules and/or plaques  - Benign-appearing - Discussed benign etiology and prognosis. - Observe - Call for any changes  MELANOCYTIC NEVI Exam: Tan-brown and/or pink-flesh-colored symmetric macules and papules Treatment Plan: Benign appearing on exam today. Recommend observation. Call clinic for new or changing moles. Recommend daily use of broad spectrum spf 30+ sunscreen to sun-exposed areas.    EPIDERMAL INCLUSION CYST x 2 R upper lateral eyelid Exam: 3 mm and 2 mm Subcutaneous nodule at right upper lateral eyelid  Benign-appearing. Exam most consistent with an epidermal inclusion cyst. Discussed that a cyst is a benign growth that can grow over time and sometimes get irritated or inflamed. Recommend observation if it is not bothersome. Discussed option of surgical excision to remove it if it is growing, symptomatic, or other changes noted. Please call for new or changing lesions so they can be evaluated.    INFLAMED SEBORRHEIC KERATOSIS (6) left temple x 3, right preauricular x 1, right temple x 2 (6) Symptomatic, irritating, patient would like treated. - Destruction of lesion - left temple x 3, right preauricular x 1, right temple x 2 (6) Complexity: simple   Destruction method: cryotherapy   Informed consent: discussed and consent obtained   Timeout:  patient name, date of birth, surgical site, and procedure verified Lesion destroyed using liquid nitrogen: Yes   Region frozen until ice ball extended beyond lesion: Yes   Outcome: patient tolerated procedure well with no complications   Post-procedure details: wound care instructions given    ACTINIC KERATOSIS left cheek x 1 Actinic keratoses are precancerous spots that appear secondary to cumulative UV radiation exposure/sun exposure over time. They are chronic with expected duration over 1 year. A portion of actinic keratoses will progress to squamous cell carcinoma of the skin. It is not possible to reliably predict which spots will progress to skin cancer and so treatment is recommended to prevent development of skin cancer.  Recommend daily broad spectrum sunscreen SPF 30+ to sun-exposed areas, reapply every  2 hours as needed.  Recommend staying in the shade or wearing long sleeves, sun glasses (UVA+UVB protection) and wide brim hats (4-inch brim around the entire circumference of the hat). Call for new or changing  lesions. - Destruction of lesion - left cheek x 1 Complexity: simple   Destruction method: cryotherapy   Informed consent: discussed and consent obtained   Timeout:  patient name, date of birth, surgical site, and procedure verified Lesion destroyed using liquid nitrogen: Yes   Region frozen until ice ball extended beyond lesion: Yes   Outcome: patient tolerated procedure well with no complications   Post-procedure details: wound care instructions given     Return for keep follow up as scheduled .  IEleanor Blush, CMA, am acting as scribe for Alm Rhyme, MD.   Documentation: I have reviewed the above documentation for accuracy and completeness, and I agree with the above.  Alm Rhyme, MD    "

## 2024-06-12 NOTE — Patient Instructions (Signed)
 Seborrheic Keratosis  What causes seborrheic keratoses? Seborrheic keratoses are harmless, common skin growths that first appear during adult life.  As time goes by, more growths appear.  Some people may develop a large number of them.  Seborrheic keratoses appear on both covered and uncovered body parts.  They are not caused by sunlight.  The tendency to develop seborrheic keratoses can be inherited.  They vary in color from skin-colored to gray, brown, or even black.  They can be either smooth or have a rough, warty surface.   Seborrheic keratoses are superficial and look as if they were stuck on the skin.  Under the microscope this type of keratosis looks like layers upon layers of skin.  That is why at times the top layer may seem to fall off, but the rest of the growth remains and re-grows.    Treatment Seborrheic keratoses do not need to be treated, but can easily be removed in the office.  Seborrheic keratoses often cause symptoms when they rub on clothing or jewelry.  Lesions can be in the way of shaving.  If they become inflamed, they can cause itching, soreness, or burning.  Removal of a seborrheic keratosis can be accomplished by freezing, burning, or surgery. If any spot bleeds, scabs, or grows rapidly, please return to have it checked, as these can be an indication of a skin cancer.  Cryotherapy Aftercare  Wash gently with soap and water everyday.   Apply Vaseline and Band-Aid daily until healed.   Actinic keratoses are precancerous spots that appear secondary to cumulative UV radiation exposure/sun exposure over time. They are chronic with expected duration over 1 year. A portion of actinic keratoses will progress to squamous cell carcinoma of the skin. It is not possible to reliably predict which spots will progress to skin cancer and so treatment is recommended to prevent development of skin cancer.  Recommend daily broad spectrum sunscreen SPF 30+ to sun-exposed areas, reapply  every 2 hours as needed.  Recommend staying in the shade or wearing long sleeves, sun glasses (UVA+UVB protection) and wide brim hats (4-inch brim around the entire circumference of the hat). Call for new or changing lesions.    Due to recent changes in healthcare laws, you may see results of your pathology and/or laboratory studies on MyChart before the doctors have had a chance to review them. We understand that in some cases there may be results that are confusing or concerning to you. Please understand that not all results are received at the same time and often the doctors may need to interpret multiple results in order to provide you with the best plan of care or course of treatment. Therefore, we ask that you please give us  2 business days to thoroughly review all your results before contacting the office for clarification. Should we see a critical lab result, you will be contacted sooner.   If You Need Anything After Your Visit  If you have any questions or concerns for your doctor, please call our main line at [phone] and press option 4 to reach your doctor's medical assistant. If no one answers, please leave a voicemail as directed and we will return your call as soon as possible. Messages left after 4 pm will be answered the following business day.   You may also send us  a message via MyChart. We typically respond to MyChart messages within 1-2 business days.  For prescription refills, please ask your pharmacy to contact our office. Our fax  number is [phone].  If you have an urgent issue when the clinic is closed that cannot wait until the next business day, you can page your doctor at the number below.    Please note that while we do our best to be available for urgent issues outside of office hours, we are not available 24/7.   If you have an urgent issue and are unable to reach us , you may choose to seek medical care at your doctor's office, retail clinic, urgent care  center, or emergency room.  If you have a medical emergency, please immediately call 911 or go to the emergency department.  Pager Numbers  - Dr. Hester: [phone]  - Dr. Jackquline: [phone]  - Dr. Claudene: [phone]   - Dr. Raymund: [phone]  In the event of inclement weather, please call our main line at [phone] for an update on the status of any delays or closures.  Dermatology Medication Tips: Please keep the boxes that topical medications come in in order to help keep track of the instructions about where and how to use these. Pharmacies typically print the medication instructions only on the boxes and not directly on the medication tubes.   If your medication is too expensive, please contact our office at [phone] option 4 or send us  a message through MyChart.   We are unable to tell what your co-pay for medications will be in advance as this is different depending on your insurance coverage. However, we may be able to find a substitute medication at lower cost or fill out paperwork to get insurance to cover a needed medication.   If a prior authorization is required to get your medication covered by your insurance company, please allow us  1-2 business days to complete this process.  Drug prices often vary depending on where the prescription is filled and some pharmacies may offer cheaper prices.  The website www.goodrx.com contains coupons for medications through different pharmacies. The prices here do not account for what the cost may be with help from insurance (it may be cheaper with your insurance), but the website can give you the price if you did not use any insurance.  - You can print the associated coupon and take it with your prescription to the pharmacy.  - You may also stop by our office during regular business hours and pick up a GoodRx coupon card.  - If you need your prescription sent electronically to a different pharmacy, notify our office  through [facility] or by phone at [phone] option 4.     Si Usted Necesita Algo Despus de Su Visita  Tambin puede enviarnos un mensaje a travs de Clinical Cytogeneticist. Por lo general respondemos a los mensajes de MyChart en el transcurso de 1 a 2 das hbiles.  Para renovar recetas, por favor pida a su farmacia que se ponga en contacto con nuestra oficina. Randi lakes de fax es [place] [phone].  Si tiene un asunto urgente cuando la clnica est cerrada y que no puede esperar hasta el siguiente da hbil, puede llamar/localizar a su doctor(a) al nmero que aparece a continuacin.   Por favor, tenga en cuenta que aunque hacemos todo lo posible para estar disponibles para asuntos urgentes fuera del horario de [place], no estamos disponibles las 24 horas del da, los 7 [address]  [address] de la [place].   Si tiene un problema urgente y no puede comunicarse con nosotros, puede optar por buscar atencin mdica  en el consultorio de su doctor(a), en ignacia  clnica privada, en un centro de atencin urgente o en una sala de emergencias.  Si tiene engineer, drilling, por favor llame inmediatamente al 911 o vaya a la sala de emergencias.  Nmeros de bper  - Dr. Hester: [phone]  - Dra. Jackquline: [phone]  - Dr. Claudene: [phone]  - Dra. Kitts: [phone]  En caso de inclemencias del [place], por favor llame a nuestra lnea principal al [phone] para una actualizacin sobre el estado de cualquier retraso o cierre.  Consejos para la medicacin en dermatologa: Por favor, guarde las cajas en las que vienen los medicamentos de uso tpico para ayudarle a seguir las instrucciones sobre dnde y cmo usarlos. Las farmacias generalmente imprimen las instrucciones del medicamento slo en las cajas y no directamente en los tubos del [place].   Si su medicamento es muy caro, por favor, pngase en contacto con landry rieger llamando al [phone] y presione la opcin 4 o envenos un mensaje  a travs de Clinical Cytogeneticist.   No podemos decirle cul ser su copago por los medicamentos por adelantado ya que esto es diferente dependiendo de la cobertura de su seguro. Sin embargo, es posible que podamos encontrar un medicamento sustituto a audiological scientist un formulario para que el seguro cubra el medicamento que se considera necesario.   Si se requiere una autorizacin previa para que su compaa de seguros cubra su medicamento, por favor permtanos de 1 a 2 das hbiles para completar este proceso.  Los precios de los medicamentos varan con frecuencia dependiendo del environmental consultant de dnde se surte la receta y alguna farmacias pueden ofrecer precios ms baratos.  El sitio web www.goodrx.com tiene cupones para medicamentos de health and safety inspector. Los precios aqu no tienen en cuenta lo que podra costar con la ayuda del seguro (puede ser ms barato con su seguro), pero el sitio web puede darle el precio si no utiliz tourist information centre manager.  - Puede imprimir el cupn correspondiente y llevarlo con su receta a la farmacia.  - Tambin puede pasar por nuestra oficina durante el horario de atencin regular y education officer, museum una tarjeta de cupones de GoodRx.  - Si necesita que su receta se enve electrnicamente a una farmacia diferente, informe a nuestra oficina a travs de MyChart de [place] o por telfono llamando al [phone] y presione la opcin 4.

## 2024-07-03 ENCOUNTER — Ambulatory Visit: Payer: Self-pay

## 2024-07-03 NOTE — Telephone Encounter (Signed)
 FYI Only or Action Required?: FYI only for provider: appointment scheduled on 07/04/24.  Patient was last seen in primary care on 04/08/2024 by Daniel Jon HERO, MD.  Called Nurse Triage reporting Cough.  Symptoms began 06/30/24.  Interventions attempted: OTC medications: Mucinex, chloraseptic, cough drops and Rest, hydration, or home remedies.  Symptoms are: gradually worsening.  Triage Disposition: See Physician Within 24 Hours  Patient/caregiver understands and will follow disposition?: yes         Message from [place] G sent at 07/03/2024 10:19 AM EST  Reason for Triage: Pt coughing up phelgm (yellowish/greenish) and nose, ear, and chest congestion   Reason for Disposition  Coughing up rusty-colored (reddish-brown) sputum    Yellow green phlegm  Answer Assessment - Initial Assessment Questions 1. ONSET: When did the cough begin?      3 days ago  2. SEVERITY: How bad is the cough today?      Coughing episodes 3. SPUTUM: Describe the color of your sputum (e.g., none, dry cough; clear, white, yellow, green)     Yellowish green 4. HEMOPTYSIS: Are you coughing up any blood? If Yes, ask: How much? (e.g., flecks, streaks, tablespoons, etc.)     no 5. DIFFICULTY BREATHING: Are you having difficulty breathing? If Yes, ask: How bad is it? (e.g., mild, moderate, severe)      no 6. FEVER: Do you have a fever? If Yes, ask: What is your temperature, how was it measured, and when did it start?     no 7. CARDIAC HISTORY: Do you have any history of heart disease? (e.g., heart attack, congestive heart failure)      A fib 8. LUNG HISTORY: Do you have any history of lung disease?  (e.g., pulmonary embolus, asthma, emphysema)     no 9. PE RISK FACTORS: Do you have a history of blood clots? (or: recent major surgery, recent prolonged travel, bedridden)     N/a 10. OTHER SYMPTOMS: Do you have any other symptoms? (e.g., runny nose, wheezing, chest pain)        Nose ear and chest congestion 11. PREGNANCY: Is there any chance you are pregnant? When was your last menstrual period?       N/a 12. TRAVEL: Have you traveled out of the country in the last month? (e.g., travel history, exposures)       N/a  Protocols used: Cough - Acute Productive-A-AH

## 2024-07-04 ENCOUNTER — Encounter: Payer: Self-pay | Admitting: Physician Assistant

## 2024-07-04 ENCOUNTER — Ambulatory Visit: Admitting: Physician Assistant

## 2024-07-04 VITALS — BP 122/84 | HR 63 | Temp 97.9°F | Resp 20 | Ht 74.0 in | Wt 210.0 lb

## 2024-07-04 DIAGNOSIS — J069 Acute upper respiratory infection, unspecified: Secondary | ICD-10-CM

## 2024-07-04 NOTE — Progress Notes (Signed)
 " Established patient visit  Patient: Daniel [name]   DOB: 1950/06/03   74 y.o. Male  MRN: [id number] Visit Date: 07/04/2024  Today's healthcare provider: Nemesio Castrillon, PA-C   Chief Complaint  Patient presents with   Cough    OTC medications: Mucinex, chloraseptic, cough drops.  Pt coughing up phelgm (yellowish/greenish) and nose, ear, and chest congestion,gradually worsening. Ongoing 1 week today   Subjective     HPI     Cough    Additional comments: OTC medications: Mucinex, chloraseptic, cough drops.  Pt coughing up phelgm (yellowish/greenish) and nose, ear, and chest congestion,gradually worsening. Ongoing 1 week today      Last edited by Wilfred Hargis RAMAN, CMA on 07/04/2024 10:53 AM.       Discussed the use of AI scribe software for clinical note transcription with the patient, who gave verbal consent to proceed.  History of Present Illness Daniel [name] is a 74 year old male who presents with hearing difficulties and nasal congestion.  He has had hearing difficulties for about one week. One hearing aid is much louder than the other, and without the louder one he feels he has no usable hearing aid. He lives alone and has not been around anyone who is sick.  He has nasal congestion, post-nasal drainage, and a stuffy nose that were present this morning, with cough productive of mucus and no pain. He is taking Mucinex and used Flonase  today.  He has atrial fibrillation and follows with a cardiologist. He has no chest pain, shortness of breath, or rapid heart beating.       10/02/2023    8:18 AM 04/02/2023    8:13 AM 09/26/2022   10:44 AM  Depression screen PHQ 2/9  Decreased Interest 0 0 0  Down, Depressed, Hopeless 0 0 0  PHQ - 2 Score 0 0 0  Altered sleeping 0  0  Tired, decreased energy 0  0  Change in appetite 0  0  Feeling bad or failure about yourself  0  0  Trouble concentrating 0  0  Moving slowly or fidgety/restless 0  0  Suicidal thoughts 0  0   PHQ-9 Score 0   0   Difficult doing work/chores Not difficult at all  Not difficult at all     Data saved with a previous flowsheet row definition      10/02/2023    8:54 AM 04/02/2023    8:13 AM  GAD 7 : Generalized Anxiety Score  Nervous, Anxious, on Edge 0  0   Control/stop worrying 0  0   Worry too much - different things 0  0   Trouble relaxing 0  0   Restless 0  0   Easily annoyed or irritable 0  0   Afraid - awful might happen 0  0   Total GAD 7 Score 0 0  Anxiety Difficulty Not difficult at all Not difficult at all     Data saved with a previous flowsheet row definition    Medications: Show/hide medication list[1]  Review of Systems All negative Except see HPI       Objective    BP 122/84   Pulse 63   Temp 97.9 F (36.6 C) (Oral)   Resp 20   Ht 6' 2 (1.88 m)   Wt 210 lb (95.3 kg)   SpO2 99%   BMI 26.96 kg/m     Physical Exam Vitals reviewed.  Constitutional:  General: He is in acute distress.     Appearance: Normal appearance. He is not ill-appearing, toxic-appearing or diaphoretic.  HENT:     Head: Normocephalic and atraumatic.     Right Ear: Tympanic membrane, ear canal and external ear normal.     Left Ear: Tympanic membrane, ear canal and external ear normal.     Nose: Congestion and rhinorrhea present.     Mouth/Throat:     Pharynx: No posterior oropharyngeal erythema.  Eyes:     General: No scleral icterus.       Right eye: No discharge.        Left eye: No discharge.     Extraocular Movements: Extraocular movements intact.     Conjunctiva/sclera: Conjunctivae normal.     Pupils: Pupils are equal, round, and reactive to light.  Cardiovascular:     Rate and Rhythm: Normal rate and regular rhythm.     Pulses: Normal pulses.     Heart sounds: Normal heart sounds. No murmur heard. Pulmonary:     Effort: Pulmonary effort is normal. No respiratory distress.     Breath sounds: Normal breath sounds. No wheezing or rhonchi.  Abdominal:      General: Abdomen is flat. Bowel sounds are normal.     Palpations: Abdomen is soft.  Musculoskeletal:        General: Normal range of motion.     Cervical back: Normal range of motion and neck supple.     Right lower leg: No edema.     Left lower leg: No edema.  Lymphadenopathy:     Cervical: No cervical adenopathy.  Skin:    General: Skin is warm and dry.     Findings: No rash.  Neurological:     General: No focal deficit present.     Mental Status: He is alert and oriented to person, place, and time. Mental status is at baseline.  Psychiatric:        Behavior: Behavior normal.        Thought Content: Thought content normal.      No results found for any visits on 07/04/24.      Assessment and Plan Assessment & Plan Acute upper respiratory infection X 7 days Discussed potential need for antibiotics and prednisone  if symptoms worsen. - Continue Mucinex. - Use nasal saline spray. - Use Flonase  twice daily for one week, then once daily until symptoms resolve. - Consider second-generation antihistamines like Allegra, Claritin, or Zyrtec  without decongestant added if tolerated. - Increase fluid intake and use Vicks VapoRub. - Contact provider if symptoms persist or worsen by Monday for potential prescription of antibiotics and prednisone . Return to clinic if symptoms persist and worsen  In the setting of asthma and acid reflux No orders of the defined types were placed in this encounter.   No follow-ups on file.   The patient was advised to call back or seek an in-person evaluation if the symptoms worsen or if the condition fails to improve as anticipated.  I discussed the assessment and treatment plan with the patient. The patient was provided an opportunity to ask questions and all were answered. The patient agreed with the plan and demonstrated an understanding of the instructions.  I, Sireen Halk, PA-C have reviewed all documentation for this visit. The  documentation on 07/04/2024  for the exam, diagnosis, procedures, and orders are all accurate and complete.  Jolynn Spencer, [facility], MMS [facility] [phone] (phone) [phone] (fax)  [facility]    [  1]  Outpatient Medications Prior to Visit  Medication Sig   carbidopa-levodopa (PARCOPA) 25-100 MG disintegrating tablet Take 1 tablet by mouth 3 (three) times daily.   Cholecalciferol (VITAMIN D ) 125 MCG (5000 UT) CAPS Take 5,000 Units by mouth daily.   cyanocobalamin  (VITAMIN B12) 1000 MCG tablet Take 1,000 mcg by mouth daily.   docusate sodium (COLACE) 100 MG capsule Take 200 mg by mouth at bedtime.   ELIQUIS 5 MG TABS tablet Take 5 mg by mouth 2 (two) times daily.   fluticasone  (FLONASE ) 50 MCG/ACT nasal spray Place 2 sprays into both nostrils daily.   levothyroxine  (SYNTHROID ) 100 MCG tablet TAKE 1 TABLET BY MOUTH EVERY DAY BEFORE BREAKFAST   metoprolol succinate (TOPROL-XL) 25 MG 24 hr tablet Take 25 mg by mouth daily.   ondansetron  (ZOFRAN ) 4 MG tablet TAKE 1 TABLET(4 MG) BY MOUTH EVERY 8 HOURS AS NEEDED FOR NAUSEA OR VOMITING   pantoprazole  (PROTONIX ) 40 MG tablet Take 1 tablet (40 mg total) by mouth daily.   predniSONE  (DELTASONE ) 20 MG tablet Take 1 tablet (20 mg total) by mouth daily with breakfast.   traMADol (ULTRAM) 50 MG tablet Take 75 mg by mouth 2 (two) times daily.   triamcinolone  cream (KENALOG ) 0.1 % Apply 1 Application topically as directed. Qd up to 5 days per week to aa rash back until clear, avoid face, groin, axilla   No facility-administered medications prior to visit.   "

## 2024-07-30 ENCOUNTER — Other Ambulatory Visit

## 2024-08-01 ENCOUNTER — Ambulatory Visit: Admitting: Urology

## 2024-08-11 ENCOUNTER — Ambulatory Visit: Admitting: Dermatology

## 2024-10-08 ENCOUNTER — Ambulatory Visit
